# Patient Record
Sex: Male | Born: 1980 | Race: White | Hispanic: No | Marital: Single | Smoking: Never smoker
Health system: Southern US, Community
[De-identification: ages and names within clinical notes are randomized; demographics above are authoritative.]

## PROBLEM LIST (undated history)

## (undated) DIAGNOSIS — I10 Essential (primary) hypertension: Secondary | ICD-10-CM

## (undated) DIAGNOSIS — K5909 Other constipation: Secondary | ICD-10-CM

## (undated) DIAGNOSIS — J189 Pneumonia, unspecified organism: Secondary | ICD-10-CM

## (undated) DIAGNOSIS — R000 Tachycardia, unspecified: Secondary | ICD-10-CM

## (undated) DIAGNOSIS — I499 Cardiac arrhythmia, unspecified: Secondary | ICD-10-CM

## (undated) DIAGNOSIS — G809 Cerebral palsy, unspecified: Secondary | ICD-10-CM

## (undated) DIAGNOSIS — D689 Coagulation defect, unspecified: Secondary | ICD-10-CM

## (undated) DIAGNOSIS — G4700 Insomnia, unspecified: Secondary | ICD-10-CM

## (undated) DIAGNOSIS — N390 Urinary tract infection, site not specified: Secondary | ICD-10-CM

## (undated) DIAGNOSIS — I82409 Acute embolism and thrombosis of unspecified deep veins of unspecified lower extremity: Secondary | ICD-10-CM

## (undated) HISTORY — PX: HERNIA REPAIR: SHX51

## (undated) HISTORY — DX: Tachycardia, unspecified: R00.0

## (undated) HISTORY — DX: Cerebral palsy, unspecified: G80.9

## (undated) HISTORY — DX: Insomnia, unspecified: G47.00

## (undated) HISTORY — PX: SMALL INTESTINE SURGERY: SHX150

## (undated) HISTORY — DX: Coagulation defect, unspecified: D68.9

## (undated) HISTORY — DX: Other constipation: K59.09

## (undated) HISTORY — DX: Urinary tract infection, site not specified: N39.0

---
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
## 1999-01-10 ENCOUNTER — Inpatient Hospital Stay: Admission: EM | Admit: 1999-01-10 | Discharge: 1999-01-19 | Payer: Self-pay | Admitting: Infectious Diseases

## 1999-01-22 ENCOUNTER — Encounter: Payer: Self-pay | Admitting: Internal Medicine

## 1999-01-22 ENCOUNTER — Inpatient Hospital Stay (HOSPITAL_COMMUNITY): Admission: EM | Admit: 1999-01-22 | Discharge: 1999-01-24 | Payer: Self-pay | Admitting: Emergency Medicine

## 1999-03-02 ENCOUNTER — Emergency Department (HOSPITAL_COMMUNITY): Admission: EM | Admit: 1999-03-02 | Discharge: 1999-03-02 | Payer: Self-pay | Admitting: *Deleted

## 1999-03-02 ENCOUNTER — Encounter (HOSPITAL_BASED_OUTPATIENT_CLINIC_OR_DEPARTMENT_OTHER): Payer: Self-pay | Admitting: General Surgery

## 2001-03-02 ENCOUNTER — Encounter: Payer: Self-pay | Admitting: *Deleted

## 2001-03-02 ENCOUNTER — Encounter: Admission: RE | Admit: 2001-03-02 | Discharge: 2001-03-02 | Payer: Self-pay | Admitting: *Deleted

## 2001-03-02 ENCOUNTER — Ambulatory Visit (HOSPITAL_COMMUNITY): Admission: RE | Admit: 2001-03-02 | Discharge: 2001-03-02 | Payer: Self-pay | Admitting: *Deleted

## 2001-04-23 ENCOUNTER — Encounter: Payer: Self-pay | Admitting: *Deleted

## 2001-04-23 ENCOUNTER — Emergency Department (HOSPITAL_COMMUNITY): Admission: EM | Admit: 2001-04-23 | Discharge: 2001-04-24 | Payer: Self-pay | Admitting: *Deleted

## 2001-04-24 ENCOUNTER — Inpatient Hospital Stay (HOSPITAL_COMMUNITY): Admission: AD | Admit: 2001-04-24 | Discharge: 2001-04-26 | Payer: Self-pay | Admitting: Family Medicine

## 2001-04-24 ENCOUNTER — Encounter: Payer: Self-pay | Admitting: Family Medicine

## 2001-04-25 ENCOUNTER — Encounter: Payer: Self-pay | Admitting: Internal Medicine

## 2002-04-27 ENCOUNTER — Encounter: Payer: Self-pay | Admitting: Family Medicine

## 2002-04-27 ENCOUNTER — Encounter: Admission: RE | Admit: 2002-04-27 | Discharge: 2002-04-27 | Payer: Self-pay | Admitting: Family Medicine

## 2002-08-20 ENCOUNTER — Emergency Department (HOSPITAL_COMMUNITY): Admission: EM | Admit: 2002-08-20 | Discharge: 2002-08-20 | Payer: Self-pay | Admitting: Emergency Medicine

## 2002-08-20 ENCOUNTER — Encounter: Payer: Self-pay | Admitting: Emergency Medicine

## 2003-03-13 ENCOUNTER — Inpatient Hospital Stay (HOSPITAL_COMMUNITY): Admission: EM | Admit: 2003-03-13 | Discharge: 2003-03-14 | Payer: Self-pay | Admitting: Emergency Medicine

## 2003-04-17 ENCOUNTER — Emergency Department (HOSPITAL_COMMUNITY): Admission: EM | Admit: 2003-04-17 | Discharge: 2003-04-17 | Payer: Self-pay | Admitting: Emergency Medicine

## 2003-12-09 ENCOUNTER — Ambulatory Visit: Payer: Self-pay | Admitting: Family Medicine

## 2004-01-05 ENCOUNTER — Ambulatory Visit: Payer: Self-pay | Admitting: Family Medicine

## 2004-01-26 ENCOUNTER — Ambulatory Visit: Payer: Self-pay | Admitting: Family Medicine

## 2004-02-02 ENCOUNTER — Ambulatory Visit: Payer: Self-pay | Admitting: Family Medicine

## 2004-02-28 ENCOUNTER — Ambulatory Visit: Payer: Self-pay | Admitting: Family Medicine

## 2004-03-08 ENCOUNTER — Ambulatory Visit: Payer: Self-pay | Admitting: Family Medicine

## 2004-03-14 ENCOUNTER — Ambulatory Visit (HOSPITAL_COMMUNITY): Admission: RE | Admit: 2004-03-14 | Discharge: 2004-03-14 | Payer: Self-pay | Admitting: Dentistry

## 2004-03-21 ENCOUNTER — Ambulatory Visit: Payer: Self-pay | Admitting: Family Medicine

## 2004-04-12 ENCOUNTER — Ambulatory Visit: Payer: Self-pay | Admitting: Family Medicine

## 2004-05-10 ENCOUNTER — Ambulatory Visit: Payer: Self-pay | Admitting: Family Medicine

## 2004-05-16 ENCOUNTER — Ambulatory Visit: Payer: Self-pay | Admitting: Family Medicine

## 2004-06-07 ENCOUNTER — Emergency Department (HOSPITAL_COMMUNITY): Admission: EM | Admit: 2004-06-07 | Discharge: 2004-06-07 | Payer: Self-pay | Admitting: Emergency Medicine

## 2004-06-08 ENCOUNTER — Ambulatory Visit: Payer: Self-pay | Admitting: Family Medicine

## 2004-06-11 ENCOUNTER — Ambulatory Visit: Payer: Self-pay | Admitting: Family Medicine

## 2004-06-15 ENCOUNTER — Ambulatory Visit: Payer: Self-pay | Admitting: Family Medicine

## 2004-06-18 ENCOUNTER — Ambulatory Visit: Payer: Self-pay | Admitting: Family Medicine

## 2004-06-19 ENCOUNTER — Ambulatory Visit: Payer: Self-pay | Admitting: Family Medicine

## 2004-07-10 ENCOUNTER — Ambulatory Visit: Payer: Self-pay | Admitting: Family Medicine

## 2004-07-17 ENCOUNTER — Ambulatory Visit: Payer: Self-pay | Admitting: Family Medicine

## 2004-07-26 ENCOUNTER — Ambulatory Visit: Payer: Self-pay | Admitting: Family Medicine

## 2004-07-30 ENCOUNTER — Ambulatory Visit: Payer: Self-pay | Admitting: Family Medicine

## 2004-08-16 ENCOUNTER — Ambulatory Visit: Payer: Self-pay | Admitting: Family Medicine

## 2004-08-31 ENCOUNTER — Ambulatory Visit: Payer: Self-pay | Admitting: Family Medicine

## 2004-09-03 ENCOUNTER — Ambulatory Visit: Payer: Self-pay | Admitting: Family Medicine

## 2004-10-02 ENCOUNTER — Ambulatory Visit: Payer: Self-pay | Admitting: Family Medicine

## 2004-10-16 ENCOUNTER — Ambulatory Visit: Payer: Self-pay | Admitting: Family Medicine

## 2004-11-13 ENCOUNTER — Ambulatory Visit: Payer: Self-pay | Admitting: Family Medicine

## 2004-11-15 ENCOUNTER — Ambulatory Visit: Payer: Self-pay | Admitting: Family Medicine

## 2004-12-18 ENCOUNTER — Ambulatory Visit: Payer: Self-pay | Admitting: Family Medicine

## 2004-12-28 ENCOUNTER — Ambulatory Visit: Payer: Self-pay | Admitting: Family Medicine

## 2005-01-30 ENCOUNTER — Ambulatory Visit: Payer: Self-pay | Admitting: Family Medicine

## 2005-02-11 ENCOUNTER — Ambulatory Visit: Payer: Self-pay | Admitting: Family Medicine

## 2005-02-28 ENCOUNTER — Ambulatory Visit: Payer: Self-pay | Admitting: Family Medicine

## 2005-03-01 ENCOUNTER — Ambulatory Visit: Payer: Self-pay | Admitting: Family Medicine

## 2005-03-29 ENCOUNTER — Ambulatory Visit: Payer: Self-pay | Admitting: Family Medicine

## 2005-04-30 ENCOUNTER — Ambulatory Visit: Payer: Self-pay | Admitting: Family Medicine

## 2005-05-17 ENCOUNTER — Ambulatory Visit: Payer: Self-pay | Admitting: Family Medicine

## 2005-06-19 ENCOUNTER — Ambulatory Visit: Payer: Self-pay | Admitting: Family Medicine

## 2005-07-08 ENCOUNTER — Ambulatory Visit: Payer: Self-pay | Admitting: Family Medicine

## 2005-07-12 ENCOUNTER — Ambulatory Visit: Payer: Self-pay | Admitting: Family Medicine

## 2005-08-02 ENCOUNTER — Ambulatory Visit: Payer: Self-pay | Admitting: Family Medicine

## 2005-08-23 ENCOUNTER — Ambulatory Visit: Payer: Self-pay | Admitting: Family Medicine

## 2005-08-30 ENCOUNTER — Encounter: Admission: RE | Admit: 2005-08-30 | Discharge: 2005-11-28 | Payer: Self-pay | Admitting: Family Medicine

## 2005-09-12 ENCOUNTER — Ambulatory Visit: Payer: Self-pay | Admitting: Family Medicine

## 2005-09-13 ENCOUNTER — Ambulatory Visit: Payer: Self-pay | Admitting: Family Medicine

## 2005-09-14 ENCOUNTER — Ambulatory Visit: Payer: Self-pay | Admitting: Family Medicine

## 2005-09-16 ENCOUNTER — Ambulatory Visit: Payer: Self-pay | Admitting: Family Medicine

## 2005-09-20 ENCOUNTER — Ambulatory Visit: Payer: Self-pay | Admitting: Family Medicine

## 2005-10-03 ENCOUNTER — Ambulatory Visit: Payer: Self-pay | Admitting: Family Medicine

## 2005-10-25 ENCOUNTER — Ambulatory Visit: Payer: Self-pay | Admitting: Family Medicine

## 2005-11-01 ENCOUNTER — Ambulatory Visit: Payer: Self-pay | Admitting: Family Medicine

## 2005-11-22 ENCOUNTER — Ambulatory Visit: Payer: Self-pay | Admitting: Family Medicine

## 2005-12-20 ENCOUNTER — Ambulatory Visit: Payer: Self-pay | Admitting: Family Medicine

## 2006-01-20 ENCOUNTER — Ambulatory Visit: Payer: Self-pay | Admitting: Family Medicine

## 2006-01-29 ENCOUNTER — Ambulatory Visit: Payer: Self-pay | Admitting: Family Medicine

## 2006-02-28 ENCOUNTER — Ambulatory Visit: Payer: Self-pay | Admitting: Family Medicine

## 2006-04-04 ENCOUNTER — Ambulatory Visit: Payer: Self-pay | Admitting: Family Medicine

## 2006-05-02 ENCOUNTER — Ambulatory Visit: Payer: Self-pay | Admitting: Family Medicine

## 2006-05-16 ENCOUNTER — Ambulatory Visit: Payer: Self-pay | Admitting: Family Medicine

## 2006-05-20 ENCOUNTER — Emergency Department (HOSPITAL_COMMUNITY): Admission: EM | Admit: 2006-05-20 | Discharge: 2006-05-20 | Payer: Self-pay | Admitting: Emergency Medicine

## 2006-05-23 ENCOUNTER — Ambulatory Visit: Payer: Self-pay | Admitting: Internal Medicine

## 2006-05-29 ENCOUNTER — Ambulatory Visit: Payer: Self-pay | Admitting: Family Medicine

## 2006-06-24 ENCOUNTER — Ambulatory Visit: Payer: Self-pay | Admitting: Family Medicine

## 2006-07-14 ENCOUNTER — Ambulatory Visit: Payer: Self-pay | Admitting: Family Medicine

## 2006-07-25 ENCOUNTER — Ambulatory Visit: Payer: Self-pay | Admitting: Family Medicine

## 2006-07-25 DIAGNOSIS — D510 Vitamin B12 deficiency anemia due to intrinsic factor deficiency: Secondary | ICD-10-CM

## 2006-07-25 DIAGNOSIS — G809 Cerebral palsy, unspecified: Secondary | ICD-10-CM

## 2006-08-18 ENCOUNTER — Ambulatory Visit: Payer: Self-pay | Admitting: Family Medicine

## 2006-08-20 ENCOUNTER — Telehealth (INDEPENDENT_AMBULATORY_CARE_PROVIDER_SITE_OTHER): Payer: Self-pay | Admitting: *Deleted

## 2006-08-27 ENCOUNTER — Ambulatory Visit: Payer: Self-pay | Admitting: Family Medicine

## 2006-08-27 ENCOUNTER — Telehealth (INDEPENDENT_AMBULATORY_CARE_PROVIDER_SITE_OTHER): Payer: Self-pay | Admitting: *Deleted

## 2006-08-27 DIAGNOSIS — I4891 Unspecified atrial fibrillation: Secondary | ICD-10-CM | POA: Insufficient documentation

## 2006-08-27 LAB — CONVERTED CEMR LAB: Prothrombin Time: 14.7 s

## 2006-09-10 ENCOUNTER — Ambulatory Visit: Payer: Self-pay | Admitting: Family Medicine

## 2006-09-10 LAB — CONVERTED CEMR LAB
INR: 1.8
Prothrombin Time: 16.7 s

## 2006-09-24 ENCOUNTER — Ambulatory Visit: Payer: Self-pay | Admitting: Family Medicine

## 2006-09-24 LAB — CONVERTED CEMR LAB: Prothrombin Time: 15 s

## 2006-10-28 ENCOUNTER — Ambulatory Visit: Payer: Self-pay | Admitting: Family Medicine

## 2006-10-28 LAB — CONVERTED CEMR LAB: INR: 1.3

## 2006-10-30 DIAGNOSIS — J029 Acute pharyngitis, unspecified: Secondary | ICD-10-CM | POA: Insufficient documentation

## 2006-10-31 ENCOUNTER — Ambulatory Visit: Payer: Self-pay | Admitting: Family Medicine

## 2006-10-31 LAB — CONVERTED CEMR LAB: Rapid Strep: NEGATIVE

## 2006-11-06 ENCOUNTER — Telehealth: Payer: Self-pay | Admitting: Family Medicine

## 2006-11-12 ENCOUNTER — Encounter: Payer: Self-pay | Admitting: Family Medicine

## 2006-11-19 ENCOUNTER — Ambulatory Visit: Payer: Self-pay | Admitting: Family Medicine

## 2006-11-19 LAB — CONVERTED CEMR LAB: Prothrombin Time: 14.8 s

## 2006-11-25 ENCOUNTER — Telehealth: Payer: Self-pay | Admitting: Family Medicine

## 2006-11-27 ENCOUNTER — Telehealth: Payer: Self-pay | Admitting: Family Medicine

## 2006-12-17 ENCOUNTER — Ambulatory Visit: Payer: Self-pay | Admitting: Family Medicine

## 2006-12-17 LAB — CONVERTED CEMR LAB: INR: 2.6

## 2007-01-16 ENCOUNTER — Ambulatory Visit: Payer: Self-pay | Admitting: Family Medicine

## 2007-01-16 DIAGNOSIS — IMO0002 Reserved for concepts with insufficient information to code with codable children: Secondary | ICD-10-CM

## 2007-01-16 LAB — CONVERTED CEMR LAB: Prothrombin Time: 17.9 s

## 2007-01-27 ENCOUNTER — Telehealth: Payer: Self-pay | Admitting: Family Medicine

## 2007-02-02 ENCOUNTER — Telehealth: Payer: Self-pay | Admitting: Family Medicine

## 2007-02-10 ENCOUNTER — Encounter: Payer: Self-pay | Admitting: Family Medicine

## 2007-02-20 ENCOUNTER — Ambulatory Visit: Payer: Self-pay | Admitting: Family Medicine

## 2007-02-27 ENCOUNTER — Telehealth: Payer: Self-pay | Admitting: Family Medicine

## 2007-03-16 ENCOUNTER — Ambulatory Visit: Payer: Self-pay | Admitting: Family Medicine

## 2007-05-25 ENCOUNTER — Emergency Department (HOSPITAL_COMMUNITY): Admission: EM | Admit: 2007-05-25 | Discharge: 2007-05-26 | Payer: Self-pay | Admitting: Emergency Medicine

## 2008-05-24 ENCOUNTER — Encounter: Admission: RE | Admit: 2008-05-24 | Discharge: 2008-07-06 | Payer: Self-pay | Admitting: Internal Medicine

## 2008-06-15 ENCOUNTER — Emergency Department (HOSPITAL_COMMUNITY): Admission: EM | Admit: 2008-06-15 | Discharge: 2008-06-15 | Payer: Self-pay | Admitting: Emergency Medicine

## 2008-06-15 ENCOUNTER — Encounter (INDEPENDENT_AMBULATORY_CARE_PROVIDER_SITE_OTHER): Payer: Self-pay | Admitting: Emergency Medicine

## 2008-06-15 ENCOUNTER — Ambulatory Visit: Payer: Self-pay | Admitting: Vascular Surgery

## 2008-09-27 ENCOUNTER — Telehealth: Payer: Self-pay | Admitting: Family Medicine

## 2008-09-28 ENCOUNTER — Inpatient Hospital Stay (HOSPITAL_COMMUNITY): Admission: EM | Admit: 2008-09-28 | Discharge: 2008-10-03 | Payer: Self-pay | Admitting: Emergency Medicine

## 2008-11-14 ENCOUNTER — Encounter (HOSPITAL_BASED_OUTPATIENT_CLINIC_OR_DEPARTMENT_OTHER): Admission: RE | Admit: 2008-11-14 | Discharge: 2008-12-05 | Payer: Self-pay | Admitting: General Surgery

## 2008-12-27 ENCOUNTER — Observation Stay (HOSPITAL_COMMUNITY): Admission: EM | Admit: 2008-12-27 | Discharge: 2008-12-29 | Payer: Self-pay | Admitting: Emergency Medicine

## 2009-01-09 ENCOUNTER — Encounter (INDEPENDENT_AMBULATORY_CARE_PROVIDER_SITE_OTHER): Payer: Self-pay | Admitting: *Deleted

## 2009-01-09 ENCOUNTER — Encounter (HOSPITAL_BASED_OUTPATIENT_CLINIC_OR_DEPARTMENT_OTHER): Admission: RE | Admit: 2009-01-09 | Discharge: 2009-02-20 | Payer: Self-pay | Admitting: Internal Medicine

## 2009-04-29 ENCOUNTER — Encounter (INDEPENDENT_AMBULATORY_CARE_PROVIDER_SITE_OTHER): Payer: Self-pay | Admitting: Emergency Medicine

## 2009-04-29 ENCOUNTER — Ambulatory Visit: Payer: Self-pay | Admitting: Vascular Surgery

## 2009-04-29 ENCOUNTER — Emergency Department (HOSPITAL_COMMUNITY): Admission: EM | Admit: 2009-04-29 | Discharge: 2009-04-29 | Payer: Self-pay | Admitting: Emergency Medicine

## 2009-05-10 ENCOUNTER — Encounter: Payer: Self-pay | Admitting: Internal Medicine

## 2009-05-17 ENCOUNTER — Encounter: Payer: Self-pay | Admitting: Internal Medicine

## 2009-05-30 ENCOUNTER — Ambulatory Visit (HOSPITAL_COMMUNITY): Admission: RE | Admit: 2009-05-30 | Discharge: 2009-05-30 | Payer: Self-pay | Admitting: Dentistry

## 2009-07-06 ENCOUNTER — Encounter: Admission: RE | Admit: 2009-07-06 | Discharge: 2009-08-24 | Payer: Self-pay | Admitting: Orthopedic Surgery

## 2009-07-18 ENCOUNTER — Encounter: Payer: Self-pay | Admitting: Internal Medicine

## 2009-08-18 ENCOUNTER — Encounter: Payer: Self-pay | Admitting: Internal Medicine

## 2009-08-23 ENCOUNTER — Encounter: Payer: Self-pay | Admitting: Internal Medicine

## 2009-08-24 ENCOUNTER — Encounter: Payer: Self-pay | Admitting: Internal Medicine

## 2009-08-24 ENCOUNTER — Inpatient Hospital Stay (HOSPITAL_COMMUNITY): Admission: EM | Admit: 2009-08-24 | Discharge: 2009-08-29 | Payer: Self-pay | Admitting: Emergency Medicine

## 2009-09-06 ENCOUNTER — Encounter: Payer: Self-pay | Admitting: Internal Medicine

## 2009-09-08 ENCOUNTER — Encounter (INDEPENDENT_AMBULATORY_CARE_PROVIDER_SITE_OTHER): Payer: Self-pay | Admitting: *Deleted

## 2009-11-29 ENCOUNTER — Ambulatory Visit: Payer: Self-pay | Admitting: Internal Medicine

## 2009-11-29 DIAGNOSIS — K5900 Constipation, unspecified: Secondary | ICD-10-CM

## 2009-12-13 ENCOUNTER — Telehealth: Payer: Self-pay | Admitting: Internal Medicine

## 2010-02-27 NOTE — Letter (Signed)
Summary: [facility]   Imported By: Sherian Rein 12/01/2009 13:36:32  _____________________________________________________________________  External Attachment:    Type:   Image     Comment:   External Document

## 2010-02-27 NOTE — Progress Notes (Signed)
Summary: Medication  Phone Note Call from Patient   Caller: with Speech Assistance Call For: Dr. Leone Payor Reason for Call: Talk to Nurse Details for Reason: Medication Summary of Call: [facility] has pt. on Miralax as needed. He understood he was to be on it daily. If this is the case, could you please fax that order to [facility] at [phone] as well as call patient and advise him.  Thank you. Initial call taken by: Schuyler Amor,  December 13, 2009 2:03 PM  Follow-up for Phone Call        LM to [facility] at home number.  Last office note faxed to [facility]. Francee Piccolo CMA Duncan Dull)  December 13, 2009 5:04 PM   # given for Alvester Morin HOuse is an out service phone number.  Will await call back from pt. Francee Piccolo CMA Duncan Dull)  December 14, 2009 8:21 AM   2nd call to pt.  Advised pt I was awaiting a call from him.  As we were talking call was cut off.  RC to pt and recieved voicemail.  Message left to return call. Francee Piccolo CMA Duncan Dull)  December 20, 2009 11:15 AM   Pt returned call through [facility].  Pt states thta he is doing well on as needed Miralax.  Pt does not need me to re-fax office note to [facility].  He will call if he needs any further assistance. Follow-up by: Francee Piccolo CMA Duncan Dull),  December 26, 2009 10:09 AM

## 2010-02-27 NOTE — Letter (Signed)
Summary: [facility]   Imported By: Sherian Rein 12/01/2009 13:46:41  _____________________________________________________________________  External Attachment:    Type:   Image     Comment:   External Document

## 2010-02-27 NOTE — Assessment & Plan Note (Signed)
Summary: diarrhea--ch.   History of Present Illness Visit Type: consult  Primary GI MD: Stan Head MD [facility] Primary Krishiv Sandler: Georgianne Fick, MD  Requesting Yechezkel Fertig: Georgianne Fick, MD  Chief Complaint: diarrhea  History of Present Illness:   30 yo wm with CP. He has been admitted twice in past year with bowel obstructions. Last admission in July he has had alternating constipation and diarrhea. Mom provides history mostly. He was taking 2 Immodium a day with a nocturnal glycerin suppository with good bowel regimen. He was in hospital 2-3 times in last year, has been constipated and needed enemas, laxatives.  Through mom and the patient have determined that he will have no stools for a few days then empty out with normal and then loose stools which are somewhat unpredictable.   GI Review of Systems      Denies abdominal pain, acid reflux, belching, bloating, chest pain, dysphagia with liquids, dysphagia with solids, heartburn, loss of appetite, nausea, vomiting, vomiting blood, weight loss, and  weight gain.      Reports constipation and  diarrhea.     Denies anal fissure, black tarry stools, change in bowel habit, diverticulosis, fecal incontinence, heme positive stool, hemorrhoids, irritable bowel syndrome, jaundice, light color stool, liver problems, rectal bleeding, and  rectal pain.    CT Abdomen/Pelvis  Procedure date:  08/24/2009  Findings:        Gas and fluid filled stomach, small bowel, and colon with an   overall pattern most suggestive of ileus.  There could also be   adhesion at the site of ileocolonic anastomosis causing proximal   small bowel obstruction with coexistent colonic fluid present.  No   free air.   Current Medications (verified): 1)  Ambien 10 Mg  Tabs (Zolpidem Tartrate) .... Take 1 Tab By Mouth At Bedtime As Needed For Sleep 2)  Imodium A-D 1 Mg/7.67ml Liqd (Loperamide Hcl) .... As Needed 3)  Cyanocobalamin 1000 Mcg/ml Soln  (Cyanocobalamin) .... Monthly 4)  Metoprolol Succinate 25 Mg Xr24h-Tab (Metoprolol Succinate) .... One Tablet By Mouth Once Daily 5)  Nitrofurantoin Macrocrystal 50 Mg Caps (Nitrofurantoin Macrocrystal) .... One Capsule By Mouth Once Daily 6)  Tab-A-Vit Tablet .... One Tablet By Mouth Once Daily 7)  Diazepam 2 Mg Tabs (Diazepam) .... One Tablet By Mouth Two Times A Day 8)  Vitamin D3 1000 Unit Caps (Cholecalciferol) .... One Capsule By Mouth Two Times A Day 9)  Calcium 500 Mg Tabs (Calcium) .... One Tablet By Mouth Three Times A Day 10)  Cyclobenzaprine Hcl 10 Mg Tabs (Cyclobenzaprine Hcl) .... One Tablet By Mouth Three Times A Day 11)  Baclofen 20 Mg Tabs (Baclofen) .... One Tablet By Mouth Four Times Daily 12)  Mapap 500 Mg Caps (Acetaminophen) .... One-Two By Mouth At Bedtime 13)  Dulcolax 10 Mg Supp (Bisacodyl) .... As Needed 14)  Fleet Enema 7-19 Gm/164ml Enem (Sodium Phosphates) .... As Needed 15)  Loperamide Hcl 2 Mg Caps (Loperamide Hcl) .... As Needed 16)  Temazepam 15 Mg Caps (Temazepam) .... One Tablet By Mouth At Bedtime 17)  Warfarin Sodium 2 Mg Tabs (Warfarin Sodium) .... As Directed 18)  Warfarin Sodium 2.5 Mg Tabs (Warfarin Sodium) .... As Directed 19)  Warfarin Sodium 3 Mg Tabs (Warfarin Sodium) .... As Directed  Allergies (verified): 1)  ! Cipro 2)  ! Morphine 3)  ! 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  Alcohol Use - yes: occ  Daily Caffeine Use: 6 daily  Illicit Drug Use - no Drug Use:  no  Review of Systems       The patient complains of fatigue, fever, headaches-new, shortness of breath, swelling of feet/legs, thirst - excessive, and urination - excessive.         In whhelchair with CP All other ROS negative except as per HPI.   Vital Signs:  Patient profile:   30 year old male Pulse rhythm:   regular Cuff size:   regular  Vitals Entered By: Ok Anis CMA (November 29, 2009 3:43 PM)  Physical Exam  General:  CP patient, facies, contractures consistent Eyes:  PERRLA, no icterus. Mouth:  poor dentition Neck:  no mass Lungs:  Clear throughout to auscultation. Heart:  Regular rate and rhythm; no murmurs, rubs,  or bruits. Abdomen:  muscular soft nontender Extremities:  braces, no edema Neurologic:  able to communicate with using letters and some simple words Cervical Nodes:  No significant cervical or supraclavicular adenopathy.   Full celiac antibody panel, IgA level, B12, CBC, CMET all ok in July 2011  Impression & Recommendations:  Problem # 1:  CONSTIPATION (ICD-564.00) Assessment New Constipation and then overflow-type issues seems to be the problem and not diarrhea. Could be wrong with history of bowel resectrion and short bowel syndrome. Could not do a rectal exam here diue to CP and inability to move him out of a wheelchair.  He has had what appear to have been fecal impactions with at least 2 of last 3 admits. Will try regular MiraLax and as needed glycerin suppositories. Minimize caffeine and minimize use of loperamde. If he ends up having more of a true diarrhea problem then would probably try antibiotics for possible SIBO but need to be cautious with warfarin therapy. Old notes in office, hhospital, labs and xrays reviewed and viewed.  Patient Instructions: 1)  Printed prescriptions given for Miralax,  Glycerin Suppositories to take to [facility]. 2)  Please decrease your caffeine. 3)  Avoid using Loperamide (imodium). 4)  Call back with any questions or if you are not getting better. 5)  Copy sent to : Georgianne Fick, MD 6)  The medication list was reviewed and reconciled.  All changed / newly prescribed medications were explained.  A complete medication list was provided to the patient / caregiver. Prescriptions: MIRALAX  POWD (POLYETHYLENE GLYCOL 3350) 1 cap or 1 tablespoon in 8 oz water daily, hold for diarrhea, call Dr. Leone Payor if dose change needed  #30 doses x 11   Entered and Authorized by:   Iva Boop MD, [facility]   Signed by:   Iva Boop MD, [facility] on 11/29/2009   Method used:   Print then Give to Patient   RxID:   [id number] FLEET LIQUID GLYCERIN SUPP 5.6 GM/DOSE ENEM (GLYCERIN (LAXATIVE)) 1 per rectum as needed  #30 x 11   Entered and Authorized by:   Iva Boop MD, [facility]   Signed by:   Iva Boop MD, FACG on 11/29/2009  Method used:   Print then Give to Patient   RxID:   [id number]

## 2010-02-27 NOTE — Letter (Signed)
Summary: New Patient letter  [facility]  [address] [place], [place] [address]   Phone: [phone]  Fax: [phone]       09/08/2009 MRN: [id number]  [name] [address] [place], [place]  [address]  Dear [name],  Welcome to the Gastroenterology Division at [facility].    You are scheduled to see Dr.  Leone Payor on 10-26-09 at 10:30a.m. on the 3rd floor at [facility], [address].  We ask that you try to arrive at our office 15 minutes prior to your appointment time to allow for check-in.  We would like you to complete the enclosed self-administered evaluation form prior to your visit and bring it with you on the day of your appointment.  We will review it with you.  Also, please bring a complete list of all your medications or, if you prefer, bring the medication bottles and we will list them.  Please bring your insurance card so that we may make a copy of it.  If your insurance requires a referral to see a specialist, please bring your referral form from your primary care physician.  Co-payments are due at the time of your visit and may be paid by cash, check or credit card.     Your office visit will consist of a consult with your physician (includes a physical exam), any laboratory testing he/she may order, scheduling of any necessary diagnostic testing (e.g. x-ray, ultrasound, CT-scan), and scheduling of a procedure (e.g. Endoscopy, Colonoscopy) if required.  Please allow enough time on your schedule to allow for any/all of these possibilities.    If you cannot keep your appointment, please call [phone] to cancel or reschedule prior to your appointment date.  This allows [place] the opportunity to schedule an appointment for another patient in need of care.  If you do not cancel or reschedule by 5 p.m. the business day prior to your appointment date, you will be charged a $50.00 late cancellation/no-show fee.    Thank you for choosing  [facility] for your medical needs.  We appreciate the opportunity to care for you.  Please visit [place] at our website  to learn more about our practice.                     Sincerely,                                                             The Gastroenterology Division

## 2010-02-27 NOTE — Letter (Signed)
Summary: [facility]   Imported By: Sherian Rein 12/01/2009 13:45:34  _____________________________________________________________________  External Attachment:    Type:   Image     Comment:   External Document

## 2010-02-27 NOTE — Letter (Signed)
Summary: [facility]   Imported By: Sherian Rein 12/01/2009 13:42:29  _____________________________________________________________________  External Attachment:    Type:   Image     Comment:   External Document

## 2010-02-27 NOTE — Letter (Signed)
Summary: [facility]   Imported By: Sherian Rein 12/01/2009 13:44:00  _____________________________________________________________________  External Attachment:    Type:   Image     Comment:   External Document

## 2010-03-01 ENCOUNTER — Ambulatory Visit (HOSPITAL_COMMUNITY): Admission: RE | Admit: 2010-03-01 | Payer: Self-pay | Source: Ambulatory Visit

## 2010-03-01 ENCOUNTER — Ambulatory Visit (HOSPITAL_COMMUNITY)
Admission: RE | Admit: 2010-03-01 | Discharge: 2010-03-01 | Disposition: A | Discharge status: Home / Self Care | Payer: 59 | Source: Ambulatory Visit | Attending: Family Medicine | Admitting: Family Medicine

## 2010-03-01 DIAGNOSIS — M7989 Other specified soft tissue disorders: Secondary | ICD-10-CM | POA: Insufficient documentation

## 2010-03-01 DIAGNOSIS — M79609 Pain in unspecified limb: Secondary | ICD-10-CM | POA: Insufficient documentation

## 2010-03-09 ENCOUNTER — Emergency Department (HOSPITAL_COMMUNITY): Payer: 59

## 2010-03-09 ENCOUNTER — Inpatient Hospital Stay (HOSPITAL_COMMUNITY)
Admission: EM | Admit: 2010-03-09 | Discharge: 2010-03-12 | DRG: 301 | Disposition: A | Discharge status: Nursing Home (Includes ICF) | Payer: 59 | Attending: Internal Medicine | Admitting: Internal Medicine

## 2010-03-09 DIAGNOSIS — Z79899 Other long term (current) drug therapy: Secondary | ICD-10-CM

## 2010-03-09 DIAGNOSIS — Y939 Activity, unspecified: Secondary | ICD-10-CM

## 2010-03-09 DIAGNOSIS — IMO0002 Reserved for concepts with insufficient information to code with codable children: Secondary | ICD-10-CM | POA: Diagnosis present

## 2010-03-09 DIAGNOSIS — M7989 Other specified soft tissue disorders: Secondary | ICD-10-CM

## 2010-03-09 DIAGNOSIS — G809 Cerebral palsy, unspecified: Secondary | ICD-10-CM | POA: Diagnosis present

## 2010-03-09 DIAGNOSIS — I10 Essential (primary) hypertension: Secondary | ICD-10-CM | POA: Diagnosis present

## 2010-03-09 DIAGNOSIS — Z9049 Acquired absence of other specified parts of digestive tract: Secondary | ICD-10-CM

## 2010-03-09 DIAGNOSIS — Y929 Unspecified place or not applicable: Secondary | ICD-10-CM

## 2010-03-09 DIAGNOSIS — Z7901 Long term (current) use of anticoagulants: Secondary | ICD-10-CM

## 2010-03-09 DIAGNOSIS — I82509 Chronic embolism and thrombosis of unspecified deep veins of unspecified lower extremity: Secondary | ICD-10-CM | POA: Diagnosis present

## 2010-03-09 DIAGNOSIS — N319 Neuromuscular dysfunction of bladder, unspecified: Secondary | ICD-10-CM | POA: Diagnosis present

## 2010-03-09 DIAGNOSIS — X58XXXA Exposure to other specified factors, initial encounter: Secondary | ICD-10-CM | POA: Diagnosis present

## 2010-03-09 DIAGNOSIS — I824Y9 Acute embolism and thrombosis of unspecified deep veins of unspecified proximal lower extremity: Principal | ICD-10-CM | POA: Diagnosis present

## 2010-03-09 DIAGNOSIS — Y998 Other external cause status: Secondary | ICD-10-CM

## 2010-03-09 LAB — POCT I-STAT, CHEM 8
BUN: 7 mg/dL (ref 6–23)
Calcium, Ion: 1.14 mmol/L (ref 1.12–1.32)
Chloride: 104 mEq/L (ref 96–112)
Creatinine, Ser: 1.1 mg/dL (ref 0.4–1.5)
Glucose, Bld: 89 mg/dL (ref 70–99)
Potassium: 4.4 mEq/L (ref 3.5–5.1)

## 2010-03-09 MED ORDER — IOHEXOL 300 MG/ML  SOLN
100.0000 mL | Freq: Once | INTRAMUSCULAR | Status: AC | PRN
  Administered 2010-03-09: 100 mL via INTRAVENOUS

## 2010-03-10 LAB — BASIC METABOLIC PANEL
CO2: 29 mEq/L (ref 19–32)
Calcium: 9 mg/dL (ref 8.4–10.5)
GFR calc Af Amer: >60 mL/min (ref >60)
GFR calc non Af Amer: >60 mL/min (ref >60)
Glucose, Bld: 93 mg/dL (ref 70–99)
Potassium: 4.2 mEq/L (ref 3.5–5.1)
Sodium: 143 mEq/L (ref 135–145)

## 2010-03-10 LAB — CBC
Hemoglobin: 14.1 g/dL (ref 13.0–17.0)
MCH: 30.3 pg (ref 26.0–34.0)
MCV: 89.3 fL (ref 78.0–100.0)
Platelets: 173 10*3/uL (ref 150–400)
RBC: 4.66 MIL/uL (ref 4.22–5.81)
WBC: 6.4 10*3/uL (ref 4.0–10.5)

## 2010-03-10 LAB — PROTIME-INR: Prothrombin Time: 22.2 seconds — ABNORMAL HIGH (ref 11.6–15.2)

## 2010-03-11 LAB — BASIC METABOLIC PANEL
Calcium: 8.5 mg/dL (ref 8.4–10.5)
GFR calc Af Amer: >60 mL/min (ref >60)
GFR calc non Af Amer: >60 mL/min (ref >60)
Potassium: 4 mEq/L (ref 3.5–5.1)
Sodium: 142 mEq/L (ref 135–145)

## 2010-03-11 LAB — CBC
Hemoglobin: 14.1 g/dL (ref 13.0–17.0)
MCH: 30.1 pg (ref 26.0–34.0)
Platelets: 164 10*3/uL (ref 150–400)
RBC: 4.69 MIL/uL (ref 4.22–5.81)
WBC: 5.9 10*3/uL (ref 4.0–10.5)

## 2010-03-11 LAB — PROTIME-INR: Prothrombin Time: 23.7 seconds — ABNORMAL HIGH (ref 11.6–15.2)

## 2010-03-12 LAB — PROTIME-INR
INR: 2.24 — ABNORMAL HIGH (ref 0.00–1.49)
Prothrombin Time: 24.9 seconds — ABNORMAL HIGH (ref 11.6–15.2)

## 2010-03-13 ENCOUNTER — Emergency Department (HOSPITAL_COMMUNITY)
Admission: EM | Admit: 2010-03-13 | Discharge: 2010-03-14 | Disposition: A | Discharge status: Home / Self Care | Payer: Managed Care, Other (non HMO) | Attending: Emergency Medicine | Admitting: Emergency Medicine

## 2010-03-13 ENCOUNTER — Emergency Department (HOSPITAL_COMMUNITY): Payer: Managed Care, Other (non HMO)

## 2010-03-13 ENCOUNTER — Encounter (HOSPITAL_COMMUNITY): Payer: Self-pay | Admitting: Radiology

## 2010-03-13 DIAGNOSIS — R0602 Shortness of breath: Secondary | ICD-10-CM | POA: Insufficient documentation

## 2010-03-13 DIAGNOSIS — R079 Chest pain, unspecified: Secondary | ICD-10-CM | POA: Insufficient documentation

## 2010-03-13 DIAGNOSIS — M549 Dorsalgia, unspecified: Secondary | ICD-10-CM | POA: Insufficient documentation

## 2010-03-13 DIAGNOSIS — Z7901 Long term (current) use of anticoagulants: Secondary | ICD-10-CM | POA: Insufficient documentation

## 2010-03-13 DIAGNOSIS — Z86718 Personal history of other venous thrombosis and embolism: Secondary | ICD-10-CM | POA: Insufficient documentation

## 2010-03-13 DIAGNOSIS — G809 Cerebral palsy, unspecified: Secondary | ICD-10-CM | POA: Insufficient documentation

## 2010-03-13 HISTORY — DX: Essential (primary) hypertension: I10

## 2010-03-13 LAB — BASIC METABOLIC PANEL
Calcium: 9.9 mg/dL (ref 8.4–10.5)
GFR calc Af Amer: >60 mL/min (ref >60)
GFR calc non Af Amer: >60 mL/min (ref >60)
Glucose, Bld: 104 mg/dL — ABNORMAL HIGH (ref 70–99)
Potassium: 4.6 mEq/L (ref 3.5–5.1)
Sodium: 141 mEq/L (ref 135–145)

## 2010-03-13 LAB — PROTIME-INR
INR: 1.95 — ABNORMAL HIGH (ref 0.00–1.49)
Prothrombin Time: 22.4 seconds — ABNORMAL HIGH (ref 11.6–15.2)

## 2010-03-13 LAB — DIFFERENTIAL
Basophils Absolute: 0 10*3/uL (ref 0.0–0.1)
Basophils Relative: 0 % (ref 0–1)
Eosinophils Absolute: 0.1 10*3/uL (ref 0.0–0.7)
Neutro Abs: 4.9 10*3/uL (ref 1.7–7.7)
Neutrophils Relative %: 63 % (ref 43–77)

## 2010-03-13 LAB — URINALYSIS, ROUTINE W REFLEX MICROSCOPIC
Bilirubin Urine: NEGATIVE
Nitrite: NEGATIVE
Specific Gravity, Urine: 1.012 (ref 1.005–1.030)
Urobilinogen, UA: 0.2 mg/dL (ref 0.0–1.0)

## 2010-03-13 LAB — CBC
Hemoglobin: 17.2 g/dL — ABNORMAL HIGH (ref 13.0–17.0)
MCH: 30.8 pg (ref 26.0–34.0)
Platelets: 174 10*3/uL (ref 150–400)
RBC: 5.58 MIL/uL (ref 4.22–5.81)
WBC: 7.7 10*3/uL (ref 4.0–10.5)

## 2010-03-13 MED ORDER — TECHNETIUM TO 99M ALBUMIN AGGREGATED
5.0000 | Freq: Once | INTRAVENOUS | Status: AC | PRN
  Administered 2010-03-13: 4.87 via INTRAVENOUS

## 2010-03-13 MED ORDER — XENON XE 133 GAS
5.0000 | GAS_FOR_INHALATION | Freq: Once | RESPIRATORY_TRACT | Status: AC | PRN
  Administered 2010-03-13: 9.45 via RESPIRATORY_TRACT

## 2010-03-14 NOTE — H&P (Signed)
[name], [name]           ACCOUNT NO.:  [id number]  MEDICAL RECORD NO.:  [id number]           PATIENT TYPE:  E  LOCATION:  WLED                         FACILITY:  [facility]  PHYSICIAN:  Thad Ranger, MD       DATE OF BIRTH:  1980-11-22  DATE OF ADMISSION:  03/09/2010 DATE OF DISCHARGE:                             HISTORY & PHYSICAL   PRIMARY CARE PHYSICIAN:  Georgianne Fick, MD  CHIEF COMPLAINT:  Left lower extremity swelling.  HISTORY OF PRESENT ILLNESS:  [name] is a 30 year old male with a past medical history of cerebral palsy, history of prior small-bowel obstruction with bowel resection, history of prior DVTs, on chronic Coumadin therapy, who was brought from the [facility] facility for the left lower extremity swelling.  History was obtained from the patient as well as the caregiver present at the bedside.  The patient has a 2-week history of left extremity swelling but no significant tenderness or warmth or skin color changes.  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  The patient was seen by his primary care physician at the beginning of February; at that time INR was therapeutic.  The patient had a Doppler ultrasound done in the emergency room which did show acute DVT in the proximal common femoral vein.  No superficial thrombophlebitis or Baker's cyst.  The patient states that this morning he had some shortness of breath but no chest pain.  He did mention slight palpitations in the morning.  The patient also stated that he had a trip to [place] in a bus in December to visit a friend.  He states that it was a long 25-hour trip, and he was taken to the emergency room over there for high BP readings.  The patient has an indwelling Foley catheter secondary to neurogenic bladder.  The patient's caregiver is Ladene Artist, phone number [phone].  Also he had a left hand superficial abrasion this morning but denies  any complaint in the left hand.  PAST MEDICAL HISTORY: 1. History of cerebral palsy. 2. History of prior DVT, on anticoagulation. 3. History of previous bowel obstruction with ileus and bowel     resection. 4. History of constipation. 5. History of hernia repair.  PAST SURGICAL HISTORY:  Bowel resection.  SOCIAL HISTORY:  Denies any smoking, alcohol or any drug use.  He currently lives in an assisted living facility.  MEDICATIONS ON ADMISSION: 1. Vitamin B12 injection monthly. 2. Folic acid 1 tablet p.o. every day. 3. Toprol XL 25 mg p.o. daily. 4. Nitrofurantoin 50 mg p.o. daily. 5. Multivitamin 1 tablet daily. 6. Diazepam/Valium 2 mg p.o. b.i.d. 7. Vitamin D3 1000 units b.i.d. 8. Calcium carbonate 500 mg p.o. t.i.d. 9. Cyclobenzaprine 10 mg p.o. t.i.d. 10.Baclofen 20 mg p.o. q.i.d. 11.Fleet's enema rectally as needed. 12.MiraLAX 17 g p.o. daily as needed. 13.Coumadin 3 mg daily except Mondays it is 2.5 mg.  PHYSICAL EXAM:  VITAL SIGNS:  Blood pressure 143/92, pulse rate 97, respiratory rate 18, temperature 97.0.  GENERAL:  The patient is alert and awake and at the baseline.  HEENT:  Anicteric sclerae,  conjunctivae. Pupils react to light and accommodation.  EOMI.  NECK: Supple.  No lymphopathy.  No JVD.  CARDIOVASCULAR:  Tachycardic, regular rate and rhythm.  No murmurs, rubs or gallops.  CHEST:  Decreased breath sounds at the bases, otherwise fairly clear.  ABDOMEN:  Soft, nontender, nondistended.  Normal bowel sounds.  EXTREMITIES:  Contractures in the lower extremities noted.  SKIN:  Warm and dry.  Left hand abrasion. NEUROLOGIC:  Alert and awake with some dysarthria and contractures.  PERTINENT LABORATORY AND X-RAY DATA:  I-STAT showed sodium 142, potassium 4.4, BUN 7, creatinine 1.1.  Hemoglobin 16.3, hematocrit 48.0. PT 21.8, INR 1.8.  Radiological data:  Left Doppler ultrasound showed DVT in the proximal common femoral vein with no obvious propagation to the  right.  No evidence of superficial thrombosis or Baker's cyst.  IMPRESSION AND PLAN:  [name] is a 31 year old male with a history of cerebral palsy, prior history of deep venous thrombosis, hypertension, who presented with left lower extremity pain likely secondary to recurrent deep venous thrombosis. 1. 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  We will place     him on Lovenox and Coumadin per Pharmacy.  I will obtain CT     angiogram of the chest to rule out PE given his complaint of mild     shortness of breath with some palpitations this morning.  We will     also consult Interventional Radiology for a possible IVC filter     placement given his recurrent deep venous thromboses. 2. Neurogenic bladder:  Continue indwelling Foley catheter. 3. History of cerebral palsy:  Continue baclofen and Flexeril. 4. Hypertension:  Will continue Toprol XL, prophylaxis with Lovenox,     and the patient is on Coumadin.     Thad Ranger, MD     RR/MEDQ  D:  03/09/2010  T:  03/09/2010  Job:  [id number]  cc:   Georgianne Fick, M.D. Fax: [phone]  Electronically Signed by Suraiya Dickerson  on 03/14/2010 03:11:17 PM

## 2010-03-16 LAB — URINE CULTURE

## 2010-03-20 NOTE — Discharge Summary (Signed)
[name]           ACCOUNT NO.:  [id number]  MEDICAL RECORD NO.:  [id number]           PATIENT TYPE:  I  LOCATION:  1438                         FACILITY:  [facility]  PHYSICIAN:  Kathlen Mody, MD       DATE OF BIRTH:  May 31, 1980  DATE OF ADMISSION:  03/09/2010 DATE OF DISCHARGE:  03/12/2010                              DISCHARGE SUMMARY   PRIMARY CARE PHYSICIAN:  Georgianne Fick, M.D.  DISCHARGE DIAGNOSES: 1. 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 2. Neurogenic bladder. 3. Cerebral palsy. 4. Hypertension. 5. Bowel obstruction. 6. Constipation.  DISCHARGE MEDICATIONS: 1. Hyzaar 100/25 mg p.o. one tablet daily. 2. Cortisporin Otic eardrops four drops for about 10 days. 3. Coumadin 5 mg daily. 4. Loperamide 2 mg p.o. one capsule t.i.d. p.r.n. 5. Diazepam 2 mg p.o. one tablet twice a day. 6. Metoprolol 25 mg one tablet p.o. daily. 7. Fleet enema daily as needed. 8. Glycerin rectal suppository p.r.n. 9. MiraLax 17 grams daily p.r.n. 10.Cyclobenzaprine 10 mg one tablet t.i.d. 11.Artificial tears drops two drops twice a day both eyes p.r.n. 12.Baclofen 20 mg p.o. one tablet four times a day. 13.Multivitamin one tablet daily. 14.Calcium carbonate 500 mg p.o. one tablet daily. 15.Nitrofurantoin 50 mg one capsule daily. 16.Folic acid 1 mg tablet daily. 17.Vitamin B12 1000 mcg one injection monthly. 18.Vitamin D tablet 1000 mg p.o. one tablet twice a day.  CONSULTATIONS:  Interventional Radiology consult for IVC filter placement.  Recommended that the patient does not meet the requirements or indication for IVC at this time.  PERTINENT LABORATORY DATA:  The patient had a CBC done, which was pretty much normal.  INR on admission was 1.88.  On admission Chem-8 was normal.  The INR today, March 12, 2010, was 2.24.  RADIOLOGY:  The patient had a CT angiogram which was negative for pulmonary embolism but showed some scattered areas of atelectasis.  BRIEF  HOSPITAL COURSE: 1. This is a 30 year old gentleman with past medical history of     cerebral palsy, prior DVTs on chronic Coumadin therapy who came in     from [facility] assisted living facility for left lower extremity     swelling.  He was found to have acute-on-chronic DVTs.  The     patient's INR on admission was subtherapeutic.  The patient was put     on Lovenox and Coumadin.  His INR has been therapeutic for the last     2 days.  He will be discharged on Coumadin of 5 mg daily with INR     checkup every week.  The patient was also complaining of some     shortness of breath on admission and a CT angiogram was done to     rule out PE, which came back negative.  An Interventional Radiology     consult was called for possible IVC filter placement, but     Interventional Radiology deemed the patient does not meet     indication for IVC filter at this time so an IVC filter was not     placed. 2. Neurogenic bladder.  The patient has an indwelling Foley catheter  and nitrofurantoin.  We will continue the same. 3. Cerebral palsy.  Continue with baclofen and Flexeril. 4. Hypertension, controlled.  We will continue with Toprol and Hyzaar.  PHYSICAL EXAMINATION:  VITAL SIGNS:  On the day of admission the patient's vital signs includes a temperature of 97.5, pulse of 87, respirations 16 per minute, saturating 96% on room air with a blood pressure 129/80. GENERAL:  He is alert, afebrile and comfortable, in no acute distress. CARDIOVASCULAR EXAMINATION:  S1-S2 heard. RESPIRATORY EXAMINATION:  Good air entry bilaterally. ABDOMEN:  Soft, nontender, nondistended.  Good bowel sounds. EXTREMITIES:  Left lower extremity slightly swollen.  DISCHARGE INSTRUCTIONS:  The patient at this time is hemodynamically stable for discharge with Coumadin, with therapeutic INR, to get weekly INR checks and to follow with PCP in about 1 to 2 weeks at the [facility] assisted living facility.           ______________________________ Kathlen Mody, MD     VA/MEDQ  D:  03/12/2010  T:  03/12/2010  Job:  [id number]  cc:   Georgianne Fick, M.D. Fax: [phone]  Electronically Signed by Kathlen Mody MD on 03/20/2010 10:30:07 PM

## 2010-04-10 ENCOUNTER — Ambulatory Visit: Payer: Managed Care, Other (non HMO) | Attending: Family Medicine | Admitting: Physical Therapy

## 2010-04-10 DIAGNOSIS — R293 Abnormal posture: Secondary | ICD-10-CM | POA: Insufficient documentation

## 2010-04-10 DIAGNOSIS — IMO0001 Reserved for inherently not codable concepts without codable children: Secondary | ICD-10-CM | POA: Insufficient documentation

## 2010-04-10 DIAGNOSIS — M2560 Stiffness of unspecified joint, not elsewhere classified: Secondary | ICD-10-CM | POA: Insufficient documentation

## 2010-04-10 DIAGNOSIS — R279 Unspecified lack of coordination: Secondary | ICD-10-CM | POA: Insufficient documentation

## 2010-04-13 LAB — PROTIME-INR
INR: 1.94 — ABNORMAL HIGH (ref 0.00–1.49)
Prothrombin Time: 22.3 seconds — ABNORMAL HIGH (ref 11.6–15.2)
Prothrombin Time: 24.9 seconds — ABNORMAL HIGH (ref 11.6–15.2)

## 2010-04-13 LAB — BASIC METABOLIC PANEL
Calcium: 8.8 mg/dL (ref 8.4–10.5)
Chloride: 109 mEq/L (ref 96–112)
GFR calc Af Amer: >60 mL/min (ref >60)
GFR calc non Af Amer: >60 mL/min (ref >60)
GFR calc non Af Amer: >60 mL/min (ref >60)
Potassium: 3.9 mEq/L (ref 3.5–5.1)
Sodium: 140 mEq/L (ref 135–145)
Sodium: 140 mEq/L (ref 135–145)

## 2010-04-14 LAB — CBC
HCT: 40.4 % (ref 39.0–52.0)
Hemoglobin: 13.7 g/dL (ref 13.0–17.0)
Hemoglobin: 17.6 g/dL — ABNORMAL HIGH (ref 13.0–17.0)
MCHC: 33.3 g/dL (ref 30.0–36.0)
MCHC: 34.3 g/dL (ref 30.0–36.0)
MCV: 91.7 fL (ref 78.0–100.0)
Platelets: 173 10*3/uL (ref 150–400)
Platelets: 188 10*3/uL (ref 150–400)
RBC: 4.41 MIL/uL (ref 4.22–5.81)
RDW: 12.9 % (ref 11.5–15.5)
RDW: 13.2 % (ref 11.5–15.5)
WBC: 6.6 10*3/uL (ref 4.0–10.5)

## 2010-04-14 LAB — BASIC METABOLIC PANEL
BUN: 3 mg/dL — ABNORMAL LOW (ref 6–23)
BUN: 4 mg/dL — ABNORMAL LOW (ref 6–23)
CO2: 23 mEq/L (ref 19–32)
Calcium: 8.3 mg/dL — ABNORMAL LOW (ref 8.4–10.5)
Calcium: 8.6 mg/dL (ref 8.4–10.5)
Chloride: 106 mEq/L (ref 96–112)
Creatinine, Ser: 0.67 mg/dL (ref 0.4–1.5)
Creatinine, Ser: 0.74 mg/dL (ref 0.4–1.5)
Creatinine, Ser: 0.83 mg/dL (ref 0.4–1.5)
GFR calc Af Amer: >60 mL/min (ref >60)
GFR calc non Af Amer: >60 mL/min (ref >60)
GFR calc non Af Amer: >60 mL/min (ref >60)
Glucose, Bld: 100 mg/dL — ABNORMAL HIGH (ref 70–99)
Glucose, Bld: 97 mg/dL (ref 70–99)
Potassium: 3.9 mEq/L (ref 3.5–5.1)
Sodium: 140 mEq/L (ref 135–145)

## 2010-04-14 LAB — COMPREHENSIVE METABOLIC PANEL
ALT: 42 U/L (ref 0–53)
AST: 35 U/L (ref 0–37)
Calcium: 10.1 mg/dL (ref 8.4–10.5)
GFR calc Af Amer: >60 mL/min (ref >60)
Sodium: 138 mEq/L (ref 135–145)
Total Protein: 7.9 g/dL (ref 6.0–8.3)

## 2010-04-14 LAB — DIFFERENTIAL
Eosinophils Absolute: 0 10*3/uL (ref 0.0–0.7)
Eosinophils Relative: 0 % (ref 0–5)
Lymphs Abs: 0.7 10*3/uL (ref 0.7–4.0)
Monocytes Relative: 8 % (ref 3–12)

## 2010-04-14 LAB — MAGNESIUM: Magnesium: 2 mg/dL (ref 1.5–2.5)

## 2010-04-14 LAB — MRSA PCR SCREENING: MRSA by PCR: NEGATIVE

## 2010-04-14 LAB — PROTIME-INR
INR: 2.37 — ABNORMAL HIGH (ref 0.00–1.49)
INR: 2.61 — ABNORMAL HIGH (ref 0.00–1.49)
Prothrombin Time: 25.7 seconds — ABNORMAL HIGH (ref 11.6–15.2)
Prothrombin Time: 28.5 seconds — ABNORMAL HIGH (ref 11.6–15.2)

## 2010-04-14 LAB — TSH: TSH: 0.637 u[IU]/mL (ref 0.350–4.500)

## 2010-04-17 LAB — CBC
HCT: 44 % (ref 39.0–52.0)
MCHC: 34.9 g/dL (ref 30.0–36.0)
MCV: 90.5 fL (ref 78.0–100.0)
Platelets: 197 10*3/uL (ref 150–400)

## 2010-04-17 LAB — BASIC METABOLIC PANEL
BUN: 9 mg/dL (ref 6–23)
CO2: 27 mEq/L (ref 19–32)
Chloride: 108 mEq/L (ref 96–112)
Creatinine, Ser: 0.88 mg/dL (ref 0.4–1.5)
Potassium: 4.7 mEq/L (ref 3.5–5.1)

## 2010-04-18 LAB — CBC
HCT: 40.2 % (ref 39.0–52.0)
MCV: 90.4 fL (ref 78.0–100.0)
Platelets: 200 10*3/uL (ref 150–400)
RDW: 13.3 % (ref 11.5–15.5)
WBC: 6.9 10*3/uL (ref 4.0–10.5)

## 2010-04-18 LAB — DIFFERENTIAL
Basophils Absolute: 0 10*3/uL (ref 0.0–0.1)
Eosinophils Absolute: 0.2 10*3/uL (ref 0.0–0.7)
Eosinophils Relative: 2 % (ref 0–5)
Lymphocytes Relative: 24 % (ref 12–46)
Lymphs Abs: 1.7 10*3/uL (ref 0.7–4.0)
Neutrophils Relative %: 66 % (ref 43–77)

## 2010-04-18 LAB — PROTIME-INR
INR: 1.75 — ABNORMAL HIGH (ref 0.00–1.49)
Prothrombin Time: 20.3 seconds — ABNORMAL HIGH (ref 11.6–15.2)

## 2010-04-18 LAB — POCT I-STAT, CHEM 8
Calcium, Ion: 1.13 mmol/L (ref 1.12–1.32)
Chloride: 106 mEq/L (ref 96–112)
Glucose, Bld: 98 mg/dL (ref 70–99)
HCT: 40 % (ref 39.0–52.0)
Hemoglobin: 13.6 g/dL (ref 13.0–17.0)

## 2010-04-27 ENCOUNTER — Ambulatory Visit: Payer: Medicare Other | Admitting: Physical Therapy

## 2010-04-27 ENCOUNTER — Ambulatory Visit: Payer: Managed Care, Other (non HMO) | Admitting: Physical Therapy

## 2010-04-29 ENCOUNTER — Ambulatory Visit (HOSPITAL_COMMUNITY)
Admission: RE | Admit: 2010-04-29 | Discharge: 2010-04-29 | Disposition: A | Discharge status: Home / Self Care | Payer: Medicare Other | Source: Intra-hospital | Attending: Internal Medicine | Admitting: Internal Medicine

## 2010-04-29 DIAGNOSIS — D510 Vitamin B12 deficiency anemia due to intrinsic factor deficiency: Secondary | ICD-10-CM | POA: Insufficient documentation

## 2010-05-01 LAB — CBC
HCT: 38.2 % — ABNORMAL LOW (ref 39.0–52.0)
Hemoglobin: 13.4 g/dL (ref 13.0–17.0)
Hemoglobin: 15.9 g/dL (ref 13.0–17.0)
MCV: 90.1 fL (ref 78.0–100.0)
MCV: 90.6 fL (ref 78.0–100.0)
RBC: 5.12 MIL/uL (ref 4.22–5.81)
WBC: 11.1 10*3/uL — ABNORMAL HIGH (ref 4.0–10.5)
WBC: 5.7 10*3/uL (ref 4.0–10.5)

## 2010-05-01 LAB — PROTIME-INR: Prothrombin Time: 20.1 seconds — ABNORMAL HIGH (ref 11.6–15.2)

## 2010-05-01 LAB — BASIC METABOLIC PANEL
Chloride: 106 mEq/L (ref 96–112)
GFR calc non Af Amer: >60 mL/min (ref >60)
Glucose, Bld: 97 mg/dL (ref 70–99)
Potassium: 3.9 mEq/L (ref 3.5–5.1)
Sodium: 140 mEq/L (ref 135–145)

## 2010-05-02 LAB — BASIC METABOLIC PANEL
CO2: 27 mEq/L (ref 19–32)
Chloride: 103 mEq/L (ref 96–112)
GFR calc Af Amer: >60 mL/min (ref >60)
Glucose, Bld: 121 mg/dL — ABNORMAL HIGH (ref 70–99)
Sodium: 138 mEq/L (ref 135–145)

## 2010-05-04 LAB — GLUCOSE, CAPILLARY
Glucose-Capillary: 106 mg/dL — ABNORMAL HIGH (ref 70–99)
Glucose-Capillary: 115 mg/dL — ABNORMAL HIGH (ref 70–99)
Glucose-Capillary: 118 mg/dL — ABNORMAL HIGH (ref 70–99)
Glucose-Capillary: 133 mg/dL — ABNORMAL HIGH (ref 70–99)
Glucose-Capillary: 134 mg/dL — ABNORMAL HIGH (ref 70–99)
Glucose-Capillary: 138 mg/dL — ABNORMAL HIGH (ref 70–99)
Glucose-Capillary: 141 mg/dL — ABNORMAL HIGH (ref 70–99)
Glucose-Capillary: 87 mg/dL (ref 70–99)

## 2010-05-04 LAB — CBC
HCT: 34.7 % — ABNORMAL LOW (ref 39.0–52.0)
Hemoglobin: 13.8 g/dL (ref 13.0–17.0)
MCHC: 34 g/dL (ref 30.0–36.0)
MCHC: 34.4 g/dL (ref 30.0–36.0)
MCHC: 34.5 g/dL (ref 30.0–36.0)
MCV: 91.7 fL (ref 78.0–100.0)
MCV: 91.8 fL (ref 78.0–100.0)
Platelets: 155 10*3/uL (ref 150–400)
Platelets: 177 10*3/uL (ref 150–400)
Platelets: 185 10*3/uL (ref 150–400)
RBC: 3.68 MIL/uL — ABNORMAL LOW (ref 4.22–5.81)
RBC: 3.79 MIL/uL — ABNORMAL LOW (ref 4.22–5.81)
RDW: 12.5 % (ref 11.5–15.5)
RDW: 13.1 % (ref 11.5–15.5)
RDW: 13.1 % (ref 11.5–15.5)
WBC: 6.1 10*3/uL (ref 4.0–10.5)

## 2010-05-04 LAB — COMPREHENSIVE METABOLIC PANEL
ALT: 14 U/L (ref 0–53)
ALT: 18 U/L (ref 0–53)
AST: 16 U/L (ref 0–37)
AST: 18 U/L (ref 0–37)
Alkaline Phosphatase: 30 U/L — ABNORMAL LOW (ref 39–117)
BUN: 8 mg/dL (ref 6–23)
CO2: 25 mEq/L (ref 19–32)
CO2: 25 mEq/L (ref 19–32)
Calcium: 7.6 mg/dL — ABNORMAL LOW (ref 8.4–10.5)
Calcium: 7.9 mg/dL — ABNORMAL LOW (ref 8.4–10.5)
Chloride: 108 mEq/L (ref 96–112)
Creatinine, Ser: 0.65 mg/dL (ref 0.4–1.5)
Creatinine, Ser: 0.78 mg/dL (ref 0.4–1.5)
GFR calc Af Amer: >60 mL/min (ref >60)
GFR calc Af Amer: >60 mL/min (ref >60)
GFR calc non Af Amer: >60 mL/min (ref >60)
GFR calc non Af Amer: >60 mL/min (ref >60)
Glucose, Bld: 129 mg/dL — ABNORMAL HIGH (ref 70–99)
Sodium: 140 mEq/L (ref 135–145)
Sodium: 143 mEq/L (ref 135–145)
Total Bilirubin: 1.8 mg/dL — ABNORMAL HIGH (ref 0.3–1.2)
Total Protein: 5.1 g/dL — ABNORMAL LOW (ref 6.0–8.3)
Total Protein: 5.6 g/dL — ABNORMAL LOW (ref 6.0–8.3)

## 2010-05-04 LAB — BASIC METABOLIC PANEL
BUN: 11 mg/dL (ref 6–23)
BUN: 4 mg/dL — ABNORMAL LOW (ref 6–23)
CO2: 28 mEq/L (ref 19–32)
CO2: 30 mEq/L (ref 19–32)
Calcium: 8.2 mg/dL — ABNORMAL LOW (ref 8.4–10.5)
Chloride: 105 mEq/L (ref 96–112)
Chloride: 107 mEq/L (ref 96–112)
Creatinine, Ser: 0.51 mg/dL (ref 0.4–1.5)
GFR calc Af Amer: >60 mL/min (ref >60)
Glucose, Bld: 126 mg/dL — ABNORMAL HIGH (ref 70–99)
Glucose, Bld: 148 mg/dL — ABNORMAL HIGH (ref 70–99)
Potassium: 4 mEq/L (ref 3.5–5.1)
Sodium: 139 mEq/L (ref 135–145)

## 2010-05-04 LAB — DIFFERENTIAL
Eosinophils Absolute: 0 10*3/uL (ref 0.0–0.7)
Eosinophils Absolute: 0.1 10*3/uL (ref 0.0–0.7)
Eosinophils Relative: 1 % (ref 0–5)
Lymphocytes Relative: 10 % — ABNORMAL LOW (ref 12–46)
Lymphs Abs: 0.3 10*3/uL — ABNORMAL LOW (ref 0.7–4.0)
Lymphs Abs: 0.6 10*3/uL — ABNORMAL LOW (ref 0.7–4.0)
Monocytes Relative: 9 % (ref 3–12)
Monocytes Relative: 9 % (ref 3–12)
Neutrophils Relative %: 85 % — ABNORMAL HIGH (ref 43–77)

## 2010-05-04 LAB — CULTURE, BLOOD (ROUTINE X 2): Culture: NO GROWTH

## 2010-05-04 LAB — PROTIME-INR
INR: 1.5 (ref 0.00–1.49)
INR: 1.7 — ABNORMAL HIGH (ref 0.00–1.49)
INR: 2 — ABNORMAL HIGH (ref 0.00–1.49)
Prothrombin Time: 14.8 seconds (ref 11.6–15.2)
Prothrombin Time: 16.7 seconds — ABNORMAL HIGH (ref 11.6–15.2)
Prothrombin Time: 17.6 seconds — ABNORMAL HIGH (ref 11.6–15.2)
Prothrombin Time: 19.4 seconds — ABNORMAL HIGH (ref 11.6–15.2)

## 2010-05-04 LAB — BLOOD GAS, ARTERIAL
Drawn by: 145321
O2 Content: 4 L/min
pCO2 arterial: 44 mmHg (ref 35.0–45.0)
pH, Arterial: 7.421 (ref 7.350–7.450)

## 2010-05-04 LAB — MRSA CULTURE

## 2010-05-04 LAB — TRIGLYCERIDES: Triglycerides: 50 mg/dL (ref <150)

## 2010-05-04 LAB — PHOSPHORUS
Phosphorus: 1.5 mg/dL — ABNORMAL LOW (ref 2.3–4.6)
Phosphorus: 2.6 mg/dL (ref 2.3–4.6)
Phosphorus: 3.3 mg/dL (ref 2.3–4.6)

## 2010-05-04 LAB — POTASSIUM: Potassium: 4.1 mEq/L (ref 3.5–5.1)

## 2010-05-04 LAB — LIPASE, BLOOD: Lipase: 39 U/L (ref 11–59)

## 2010-05-04 LAB — MAGNESIUM: Magnesium: 1.9 mg/dL (ref 1.5–2.5)

## 2010-05-04 LAB — PREPARE FRESH FROZEN PLASMA

## 2010-05-05 LAB — CBC
Platelets: 215 10*3/uL (ref 150–400)
RDW: 13.1 % (ref 11.5–15.5)

## 2010-05-05 LAB — DIFFERENTIAL
Basophils Relative: 1 % (ref 0–1)
Eosinophils Absolute: 0 10*3/uL (ref 0.0–0.7)
Lymphs Abs: 0.4 10*3/uL — ABNORMAL LOW (ref 0.7–4.0)
Monocytes Absolute: 0.9 10*3/uL (ref 0.1–1.0)
Monocytes Relative: 7 % (ref 3–12)
Neutro Abs: 10.5 10*3/uL — ABNORMAL HIGH (ref 1.7–7.7)

## 2010-05-05 LAB — URINALYSIS, ROUTINE W REFLEX MICROSCOPIC
Hgb urine dipstick: NEGATIVE
Protein, ur: 100 mg/dL — AB
Urobilinogen, UA: 1 mg/dL (ref 0.0–1.0)

## 2010-05-05 LAB — COMPREHENSIVE METABOLIC PANEL
ALT: 19 U/L (ref 0–53)
Albumin: 3.5 g/dL (ref 3.5–5.2)
Alkaline Phosphatase: 48 U/L (ref 39–117)
Potassium: 3.8 mEq/L (ref 3.5–5.1)
Sodium: 137 mEq/L (ref 135–145)
Total Protein: 6.2 g/dL (ref 6.0–8.3)

## 2010-05-05 LAB — URINE CULTURE: Colony Count: >=100000

## 2010-05-05 LAB — PROTIME-INR: INR: 4 — ABNORMAL HIGH (ref 0.00–1.49)

## 2010-05-10 ENCOUNTER — Ambulatory Visit: Payer: 59 | Attending: Family Medicine | Admitting: Physical Therapy

## 2010-05-10 DIAGNOSIS — R279 Unspecified lack of coordination: Secondary | ICD-10-CM | POA: Insufficient documentation

## 2010-05-10 DIAGNOSIS — R293 Abnormal posture: Secondary | ICD-10-CM | POA: Insufficient documentation

## 2010-05-10 DIAGNOSIS — M2560 Stiffness of unspecified joint, not elsewhere classified: Secondary | ICD-10-CM | POA: Insufficient documentation

## 2010-05-10 DIAGNOSIS — IMO0001 Reserved for inherently not codable concepts without codable children: Secondary | ICD-10-CM | POA: Insufficient documentation

## 2010-05-17 ENCOUNTER — Ambulatory Visit: Payer: Medicare Other | Admitting: Physical Therapy

## 2010-05-24 ENCOUNTER — Ambulatory Visit: Payer: 59 | Admitting: Physical Therapy

## 2010-06-07 ENCOUNTER — Ambulatory Visit: Payer: Medicare Other | Attending: Family Medicine | Admitting: Physical Therapy

## 2010-06-07 DIAGNOSIS — R279 Unspecified lack of coordination: Secondary | ICD-10-CM | POA: Insufficient documentation

## 2010-06-07 DIAGNOSIS — R293 Abnormal posture: Secondary | ICD-10-CM | POA: Insufficient documentation

## 2010-06-07 DIAGNOSIS — IMO0001 Reserved for inherently not codable concepts without codable children: Secondary | ICD-10-CM | POA: Insufficient documentation

## 2010-06-07 DIAGNOSIS — M2560 Stiffness of unspecified joint, not elsewhere classified: Secondary | ICD-10-CM | POA: Insufficient documentation

## 2010-06-12 NOTE — H&P (Signed)
[name]           ACCOUNT NO.:  [id number]   MEDICAL RECORD NO.:  [id number]          PATIENT TYPE:  INP   LOCATION:  0101                         FACILITY:  [facility]   PHYSICIAN:  Cherene Altes, MD     DATE OF BIRTH:  December 14, 1980   DATE OF ADMISSION:  09/27/2008  DATE OF DISCHARGE:                              HISTORY & PHYSICAL   HISTORY OF PRESENT ILLNESS:  This 30 year old male resident of an adult  home for disabled adults presents to the emergency room with a 24-hour  course of worsening nausea.  This progressed to where he has had at  least six episodes of vomitus now.  Vomited several times since coming  to the emergency room.  He has a prior history of small-bowel infarct,  though, I cannot find a year, Careers adviser, or details in the computer  records.  Nonetheless, he was status post surgical repair with ARDS  postoperatively.  At baseline, he is wheelchair bound and requires full  assist with transfer.  He does not know who was following his primary  care at the facility, but per [facility] listed as Georgianne Fick, M.D.  Workup in the emergency room demonstrates some mild leukocytosis in the  11 range and CT scan of the abdomen and pelvis suggests a small bowel  obstruction.   PAST MEDICAL HISTORY:  1. Cerebral palsy.  2. 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  3. History ARDS postop bowel resection prior to 2000.  4. Coccyx ulcer November 2000 status post muscle flap repair.  5. Recurrent UTI.  6. Questionable C diff colitis in 2000.  7. 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   PAST SURGICAL HISTORY:  As above.   FAMILY HISTORY:  The patient is unable to provide with marked  dysarthria.   SOCIAL HISTORY:  Again, he lives in a group home for disabled adults.  There is no tobacco, alcohol, or illicit drugs.   REVIEW OF SYSTEMS:  I deferred this due to patient  dysarthria.  What I  know is listed under the history of present illness and provided by his  caregiver from the group home.   HOME MEDICATIONS:  Per facility MAR:  1. Benadryl 25 mg three times daily as needed.  2. Restoril 15 mg daily bedtime as needed.  3. Coumadin 3 mg on Monday, Tuesday, Thursday, Friday, Saturday,      Sunday and 1.5 mg on Wednesday.  4. Nizoral 2% cream twice daily for 2 weeks as needed.  5. Baclofen 15 mg twice daily.  6. Calcium 500 mg three times daily.  7. Flexeril 10 mg three times daily.  8. Diazepam 2 mg a.m. and 2 mg bedtime.  9. Fleet's glycerin suppository per rectum and at bedtime.  10.Tylenol 500 mg 2 tablets equaling 1,000 mg daily bedtime.  11.Fleet's enema per rectum as needed.  12.B12 injections 1000 mcg monthly.  13.Folic acid 1 mg daily.  14.Toprol XL 25 mg daily.  15.Macrodantin 50  mg daily.  16.Triamterene/HCTZ 37.5/25 mg 1 tablet daily a.m.  17.Baclofen 20 mg twice daily 8 a.m. and 12 noon.  18.Imodium 2 mg twice daily 8 a.m. and 12 noon.  19.Vitamin D 1000 international units twice daily.   ALLERGIES:  OPIATES/MORPHINE, CIPRO.   PHYSICAL EXAMINATION:  VITAL SIGNS:  Temperature 97.3, pulse 121,  respiration 18, blood pressure 132/92.  O2 sats 96% on room air.  GENERAL APPEARANCE:  This is a well-developed male in no acute distress.  Does complain of abdominal pain.  SKIN:  Turgor is adequate.  There are some scratch marks on his arm and  hand, particularly on the right.  I see no wounds or ulcers.  Though, I  did not view the buttocks.  HEENT:  Head normocephalic.  Eyes:  PERRLA.  Ears: Canals clear and  hearing grossly normal.  Nose:  Nares patent without masses or discharge  noted.  Mouth:  Oral mucosa pink and moist.  NECK/THYROID:  Neck is supple with no adenopathy or bruits noted.  Thyroid nonpalpable.  CARDIAC:  Tachycardiac though regular.  Apical rate about 120.  No  murmur, but I do hear an intermittent split S2.  Pedal  pulses are  depressed.  There is trace pedal edema.  LUNGS:  Clear and equal but weak inspiratory effort.  ABDOMEN:  Kidney high-pitched bowel sounds in the lower quadrants.  I do  not appreciate bowel sounds in the upper quadrants.  Abdomen is  distended and tense.  There is significant pain with some guarding and  rebound tenderness noted.  URINARY GENITAL:  Normal male genitalia.  He has a condom catheter in  place draining clear yellow urine.  The bladder is not distended and  there is no bladder area pain.  No CVA tenderness.  MUSCULOSKELETAL:  He has some flexion contractures at the shoulder and  elbows.  He also has decreased range of motion at the hips and knees,  likely flexion contractures.  Strength is globally decreased.  He is  unable to defy gravity with his legs and feet.  There is foot drop noted  bilaterally.  NEUROLOGIC:  Speech is dysarthric.  He is alert and oriented.  There is  no suggestion of mental retardation.  Deep tendon reflexes not obtained.  Toes nonreactive to Babinski.  PSYCHIATRIC:  No indication of depression or anxiety seen.  HEMATOLOGIC:  No indication of active bleeding seen.  Capillary refill  is 3+.   LABORATORY DATA:  Lipase 15.  PT 38.7, INR elevated to 4.0.  CBC found  WBC elevated 11.8, hemoglobin 15.5, hematocrit 45.9, platelets 215.  Neutrophil percent elevated at 89 and lymphocyte percent 3.  Metabolic  panel:  Sodium 137, potassium 3.8 and chloride 101, CO2 26, BUN 17,  creatinine 0.94.  Glucose elevated 129.  Total bili elevated at 1.4 with  other liver function normal.  GFR greater than 60.  Urine microscopic  showed many bacteria.  CT scan of the abdomen/pelvis showed dilated  stomach and small bowel with air-fluid levels consistent with high-grade  small-bowel obstruction.  Abdominal x-ray series noted dilated bowel  loops, question small bowel obstruction versus ileus.   IMPRESSION/PLAN:  1. High-grade small-bowel obstruction.   Would attempt to decompress      with NG tube to low intermittent wall suction.  Surgery consult      pending, but I believe has been called.  Will repeat a three-way      abdominal x-ray in a.m.  IV  fluids for hydration.  Normal saline at      75 cc/hour n.p.o. status.  Would start antibiotics empirically with      Flagyl and Rocephin IV pharmacy to dose.  Will check a lactic acid      level.  2. Coagulopathy.  Need to reduce the INR currently 4.0.  We are      holding the Coumadin and will give vitamin K 10 mg IM times one      dose and repeat a PT/INR in a.m.  Would use heparin 500 units every      8 hours for DV prophylaxis when INR less than to.  Will also use      SCDs and TED hose for DVT prophylaxis, noting prior history of DVT      times two at least.  The heparin will be the shortest half life if      surgery is required at some point.  3. Risk stratification.  Major risk factor is his multiple sclerosis      and hypoventilation as his respiratory muscles probably affected.      This suggested in his prior history of ARDS following his last      surgery.  This would put him in a fairly high risk operative      candidate classification.  No other significant risk factors other      than his coagulopathy that I see currently.  4. Leukocytosis.  Could be reactive with SB, although he has many      bacteria in his urine.  It is quite likely that he is colonized      there, however.  Will send urine for C&S and hold Macrodantin for      now.  Will obtain blood cultures x2.  Antibiotics as above.  5. Multiple sclerosis.  Will continue his Valium IV push but hold the      baclofen and Flexeril currently.  6. Insomnia.  I will hold his prior medication and use diazepam b.i.d.  7. Chronic constipation.  I think we can manage this with Fleet's      enema Dulcolax suppository, etc..  8. Hypertension.  Will hold the p.o. Toprol, but if possible, this is      also used for rate control  given his tachycardia.  Would consider      12.5 mg metoprolol IV twice daily and follow blood pressures and      pulse, but will confer with admitting physician prior to ordering.      Everett Graff, N.P.      Cherene Altes, MD  Electronically Signed    TC/MEDQ  D:  09/28/2008  T:  09/28/2008  Job:  [id number]

## 2010-06-12 NOTE — Consult Note (Signed)
NAMESKYY, MCKNIGHT           ACCOUNT NO.:  [id number]   MEDICAL RECORD NO.:  [id number]          PATIENT TYPE:  INP   LOCATION:  0101                         FACILITY:  [facility]   PHYSICIAN:  Angelia Mould. Derrell Lolling, M.D.DATE OF BIRTH:  1980/07/05   DATE OF CONSULTATION:  09/28/2008  DATE OF DISCHARGE:                                 CONSULTATION   REASON FOR CONSULTATION:  Evaluate abdominal distention and small bowel  obstruction.   HISTORY:  This is a 30 year old Caucasian male who is a resident of [facility] because of chronic disability from cerebral palsy.  He basically  is total care in a wheelchair with all 4 extremities disabled.  He is,  however, normal mentally and is a high Garment/textile technologist.   With that background, he has a significant past history of exploratory  laparotomy 10 years ago and resection of one-third of his small bowel by  Dr. Joanne Gavel for what was said to be an ischemic small bowel  obstruction.  Postop he had ARDS and skin breakdown and was hospitalized  for 2 months.   He now presents to the [facility] ED with a 24-hour history of  abdominal pain, abdominal distention, nausea, and vomiting.  He has been  somewhat anxious and tachycardic in the emergency room, but has been  otherwise perfectly stable.  A nasogastric tube was placed and has  drained a total of 1000 mL of thick bilious somewhat enteric content and  he said his pain has gone from a 10 out of 10 down to a 1 out of 10 and  he is very comfortable now.  He has only received IV Valium for  agitation and has not received any narcotics.   In the emergency room he had abdominal x-rays and a CT scan.  This  suggests a high-grade distal small bowel obstruction of unknown cause.  There is an old staple line near the terminal ileum and cecum.  There is  no free fluid.  There is no free air.  There is no bowel wall thickening  or pneumatosis.  There is a loop of small bowel above the right lobe of  the liver which is probably chronic.   He is anticoagulated on Coumadin chronically because of a history of  recurrent DVT.  He is being admitted by the [facility] and I  have been asked to consult regarding his small bowel obstruction.   PAST MEDICAL HISTORY:  1. Cerebral palsy.  2. History of ischemic small bowel obstruction approximately 10 years      ago, status post extensive resection in approximately the year 2000      by Dr. Joanne Gavel.  Records pending.  3. History of postop ARDS and multiple decubiti in the year 2000.  4. Status post rotation muscle flap to cover a sacral decubitus.  5. History of recurrent uric UTI.  6. History of recurrent DVT on Coumadin.  7. Questionable history of C. difficile colitis, remote.   HOME MEDICATIONS:  1. Benadryl 25 mg p.o. p.r.n.  2. Restoril 15 mg 1 p.o. at bedtime p.r.n.  3. Coumadin  3 mg Monday, Tuesday, Thursday, Friday, Saturday, Sunday      and 1.5 mg on Wednesday.  4. Nizoral 2% cream b.i.d. p.r.n.  5. Baclofen 15 mg p.o. b.i.d.  6. Calcium 500 mg t.i.d.  7. Flexeril 10 mg t.i.d.  8. Diazepam 2 mg at bedtime.  9. Fleet's glycerin suppositories p.r.n.  10.Tylenol p.r.n.  11.Fleet's enema p.r.n.  12.Vitamin B12 injection 1000 mcg monthly.  13.Valium 2 mg p.o. daily.  14.Folic acid 1 mg p.o. daily.  15.Toprol XL 25 mg daily.  16.Macrodantin 50 mg daily.  17.Triamterene/hydrochlorothiazide 37.5/25 mg 1 p.o. daily.  18.Imodium p.r.n. 8 a.m. and 12 noon.  19.Vitamin D 1000 units p.o. b.i.d.   DRUG ALLERGIES:  1. MORPHINE.  2. CIPRO.  3. OPIATES.   SOCIAL HISTORY:  No alcohol or tobacco.  Resident of a nursing facility.  Disabled with cerebral palsy.   FAMILY HISTORY:  Noncontributory.   REVIEW OF SYSTEMS:  A 10-system review of systems is performed and is  noncontributory except as described above.   PHYSICAL EXAMINATION:  GENERAL:  Alert, cooperative white male with  cerebral palsy.  He does not appear to be  in any distress at this  moment, but is somewhat anxious and agitated.  He is very cooperative,  answers questions as best he can, has a speech impediment.  VITAL SIGNS:  Temperature 97.0, blood pressure 161/100, heart rate 125,  regular sinus tachycardia, respiratory rate 18 and unlabored.  EYES:  Sclerae are clear.  Extraocular movements intact.  NECK:  Supple, nontender.  No mass, no jugular venous distention.  LUNGS:  Clear to auscultation.  No wheezing, no rhonchi.  No chest wall  tenderness.  HEART:  Regular tachycardia.  No ectopy.  No murmur heard.  Radial and  femoral pulses are palpable.  ABDOMEN:  There is a midline scar well-healed.  There is no hernia in  the abdomen or in the inguinal area.  He is distended and tympanitic,  but quite soft and he has almost no tenderness and absolutely has no  guarding and no rebound.  I do not feel a mass.  Liver and spleen are  not enlarged.  Bowel sounds are hypoactive.  EXTREMITIES:  Atrophic with some contracture.  NEUROLOGIC:  No gross acute deficits, but profound motor weakness  throughout with contracture.   ADMISSION LABORATORY DATA:  Abdominal films and CT scan are described  above.  Urinalysis shows a urinary tract infection.  Hemoglobin 15.5,  white blood cell count 11,800 with a left shift.  Prothrombin time 38.7,  INR 4.0.  BUN 17, creatinine 0.9, glucose 126, lipase 15.  Liver  function tests normal.   ASSESSMENT:  1. Recurrent small bowel obstruction.  I suspect this is probably due      to adhesions.  His examination, laboratory, and radiographic workup      is not consistent with any evidence of compromised bowel and I do      not think that he needs emergent exploration.  He has markedly      improved symptomatically with a nasogastric tube.  2. History of deep venous thrombosis, anticoagulated.  3. Cerebral palsy.  4. Status post laparotomy with small bowel resection 10 years ago.  5. History of postoperative acute  respiratory distress syndrome and      decubitus ulcer.  6. Recurrent urinary tract infection.   PLAN:  1. The patient will be admitted by the Medical Service to the step-  down ICU.  2. Nasogastric suction.  3. Aggressive fluid and electrolyte hydration.  4. Aggressive reversal of his anticoagulation with vitamin K and fresh      frozen plasma.  5. Reassess with physical exam and lab work within the next 8 hours or      so.  6. Reassess with physical exam and radiographic exam tomorrow morning.   He may need a laparotomy if he fails to resolve, which I am concerned  that this may happen, or if he becomes tender and shows any sign of  bowel compromise.      Angelia Mould. Derrell Lolling, M.D.  Electronically Signed     HMI/MEDQ  D:  09/28/2008  T:  09/28/2008  Job:  [id number]   cc:   [facility]

## 2010-06-15 NOTE — Discharge Summary (Signed)
[place]  Patient:    [name], [name] Visit Number: [id number] MRN: [id number]          Service Type: MED Location: [id number] Attending Physician:  Evette Georges Dictated by:   Justine Null, M.D. [facility] Admit Date:  04/24/2001 Discharge Date: 04/26/2001                             Discharge Summary  REASON FOR ADMISSION:  Ileus.  HISTORY OF PRESENT ILLNESS:  The patient is a 30 year old man, admitted by Dr. Tawanna Cooler on April 24, 2001, with ileus.  Please refer to his dictated History and Physical for details.  HOSPITAL COURSE:  The patient was admitted and treated with bowel prep and intravenous fluids.  On this, the patient had copious bowel movements, and his ileus resolved.  By April 26, 2001, the patient was alert, cooperative, eating his usual diet, and ready for discharge home.  He was therefore discharged home in fair condition.  DISCHARGE DIAGNOSES: 1. Ileus, resolved. 2. Other chronic medical problems as noted in the History and Physical.  MEDICATIONS:  B12 1 mg every month, multivitamin one daily, folic acid 1 mg daily, Imodium 2 mg tablets two in the morning, two at noon, and two at 4 p.m., Macrodantin 50 mg daily for UTI prophylaxis, Senna suppository one at h.s., Minocin 50 mg b.i.d., Tums 500 mg three times a day.  ACTIVITY:  The patient should be assisted from bed to his wheelchair.  DIET:  Low fat.  FOLLOWUP:  With Dr. Tawanna Cooler as needed.  OTHER INSTRUCTIONS:  The goal is that the patient will have one bowel movement every night. Dictated by:   Justine Null, M.D. [facility] Attending Physician:  Evette Georges DD:  04/26/01 TD:  04/27/01 Job: [id number]

## 2010-06-15 NOTE — Assessment & Plan Note (Signed)
[facility]                                 ON-CALL NOTE   NAME:[name], Michael                    MRN:          [id number]  DATE:11/29/2006                            DOB:          06/24/1980    PHONE NUMBER:  [phone]   Caller was Amy at [facility].   SUBJECTIVE:  The patient was readmitted on Wednesday, is out of  medicine.  He takes Coumadin 3 mg everyday and has no refills.  She is  not sure why it has not been refilled.  In talking with trying to get  medication, she wanted a prescription faxed to the [facility] which is  their prescription pharmacy.  I am unable to fax and would like to call  in a local prescription.  Grandparents are there and will pick up  prescription from the pharmacy.  I ended up finally calling it in at [facility]  at [address] [facility] [address] at [phone].  Prescription was for Coumadin  3 mg a day #30 and no refills.   PRIMARY CARE PHYSICIAN:  Jeffrey A. Todd, M.D., home office [place].     Robert N. Schaller, MD  Electronically Signed    RNS/MedQ  DD: 11/29/2006  DT: 11/30/2006  Job #: [id number] 

## 2010-06-15 NOTE — H&P (Signed)
[place]  Patient:    NU[facility], LIPTON Visit Number: [id number] MRN: [id number]          Service Type: EMS Location: [place] Attending Physician:  Carmelina Peal Dictated by:   Evette Georges, M.D. [facility] Admit Date:  04/23/2001 Discharge Date: 04/24/2001                           History and Physical  DATE OF BIRTH: Apr 02, 1980  PRIMARY CARE PHYSICIAN: Evette Georges, M.D.  CHIEF COMPLAINT: This is one of many [place]. 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  HISTORY OF PRESENT ILLNESS: The patient felt well until yesterday, when he noticed abdominal pain.  It increased in the evening and he went to the emergency room.  He was seen at [facility] Emergency Room and was found to be afebrile.  No vomiting.  No bowel movement for two days, with a somewhat distended abdomen and decreased bowel sounds.  Laboratory data was negative except for abdominal film showing an ileus with positive gallstones. He was felt to be okay and therefore was sent home on clear liquids and asked to follow up with [place] today.  His grandmother brings him in today.  (Mom and Dad are in [place], [place]).  Today he says he is no better.  He continues to have abdominal pain.  He describes it as diffuse.  It is not local.  He is somewhat queasy but had no vomiting and no diarrhea.  Indeed, he had no bowel movement in two to three days now.  The patient has had a history of a bowel infarction requiring major surgery three years ago.  At that time he developed sepsis and postoperative ARDS, and numerous areas of skin breakdown that required prolonged two month hospitalization.  He has been GI well otherwise since.  The only interim history is that he has had a DVT.   He was on Coumadin for 12 months and finally weaned off, has had no recurrence.  PAST MEDICAL HISTORY:  1. Past hospitalization, DVT as noted above.  2. Small bowel obstruction with subsequent bowel infarction requiring major     surgery and resection of part of his small bowel, with subsequent     secondary complications postoperative requiring ventilator assistance     because of ARDS, etc.  REVIEW OF SYSTEMS/FAMILY HISTORY/SOCIAL HISTORY/ETC.: Unchanged.  He is currently a full-time resident at [employer].  MEDICATIONS:  1. Vitamin B-12 1 cc q.monthly.  2. Multivitamin.  3. Folic acid 1 mg q.d.  4. Imodium 2 mg two tablets q.a.m., two at noon, and two at 4 p.m. to     prevent diarrhea.  5. Macrodantin 50 mg q.d. for prophylaxis for UTI.  6. Senna suppository one rectally q.h.s.  7. Minocin 50 mg b.i.d. for acne.  8. Tums 500 mg t.i.d.  PHYSICAL EXAMINATION:  VITAL SIGNS: TEMP 98 degrees, pulse 70 and regular, respirations 12 and regular, BP 120/80.  GENERAL: He is a well-developed, well-nourished male, able to vocalize his concerns.  Again, he has normal IQ.  HEENT: Negative.  NECK: Supple.  Thyroid not enlarged.  CHEST: Clear to auscultation.  CARDIAC: Negative except for pulmonic murmur.  He has history of subpulmonic stenosis and has seen  pediatric cardiology.  That murmur is unchanged.  ABDOMEN: Somewhat distended.  I can appreciate an occasional high-pitch tinkling bowel sound.  Otherwise, the abdomen is fairly quiet.  There is diffuse tenderness and no rebound.  IMPRESSION: Ileus, with history of bowel infarction which required subsequent surgery and removal of small bowel and subsequent adult respiratory distress syndrome postoperatively.  PLAN:  1. Admit.  2. IV fluids.  3. Bowel prep.  4. Also, with a history of DVT will start him on heparin subcu 5000 units     b.i.d. Dictated by:   Evette Georges, M.D. [facility] Attending Physician:  Carmelina Peal DD:  04/24/01 TD:  04/25/01 Job: [id number]

## 2010-06-15 NOTE — H&P (Signed)
NAME:  [name], [name]                     ACCOUNT NO.:  [id number]   MEDICAL RECORD NO.:  [id number]                   PATIENT TYPE:  EMS   LOCATION:  MINO                                 FACILITY:  [facility]   PHYSICIAN:  Wanda Plump, MD [facility]                 DATE OF BIRTH:  [birth year]   DATE OF ADMISSION:  03/13/2003  DATE OF DISCHARGE:                                HISTORY & PHYSICAL   PRIMARY CARE PHYSICIAN:  Tinnie Gens A. Tawanna Cooler, M.D. [facility]   CHIEF COMPLAINT:  Leg swelling.   HISTORY OF PRESENT ILLNESS:  Mr. Kanouse is a 30 year old white male with a  history of  cerebral palsy who has been  admitted  with a 2 day history of  left leg swelling without any pain or redness. An ultrasound done in the  emergency room showed an acute DVT. He is otherwise doing well.   MEDICAL HISTORY:  1. History of cerebral palsy with a normal IQ.  2. 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  3. Previous DVT of the left leg about 4 years ago, status post  Coumadin for     1 year.  4. History of extensive skin  breakdown  due to immobilization requiring     several months of inpatient treatment.   SOCIAL HISTORY:  Negative  for tobacco or alcohol.   FAMILY HISTORY:  Negative for other  family  members with DVTs or PEs that  we know of.   REVIEW OF SYSTEMS:  Basically negative except for a questionable lower  temperature today. No chest pain, cough or shortness of breath. No nausea or  vomiting or diarrhea. Appetite is normal.   MEDICATIONS:  1. Multivitamins once a day.  2. Macrodantin 50 mg 1 p.o. every day.  3. Folic acid 1 mg 1 p.o. every day.  4. B12 1000 micrograms every month.  5. Imodium as needed.  6. Flexeril which was started 2 weeks ago to help with spasms.  7. Minocycline 100 mg 1 p.o. t.i.d. which is prescribed apparently for acne.  8. Motrin, Tylenol and glycerine suppositories p.r.n.   ALLERGIES:  No known drug  allergies.   PHYSICAL EXAMINATION:  GENERAL:  The patient is alert, in no apparent  distress, cooperative and coherent.  VITAL SIGNS:  Temperature 99.5, pulse 132, later on decreased to 100, blood  pressure 124/77, respiratory rate 20, oxygen saturation on room air is 97%.  LUNGS:  Clear to auscultation bilaterally.  CARDIOVASCULAR:  Regular rate and rhythm without murmur.  ABDOMEN:  Nontender.  EXTREMITIES:  There is moderate swelling at the left lower extremity, mostly  about  the knee. This swelling is not pitting.   LABORATORY DATA:  D-dimer is 0.41. Venous Doppler shows an acute  DVT of the  left leg.   ASSESSMENT AND PLAN:  1. Deep venous  thrombosis without clinical evidence of pulmonary embolism.     Will admit him for subcu Lovenox and Coumadin.  2. He has a history of severe skin breakdown. The patient and the family are     very concerned about it. We will go ahead and put him on an air mattress     and I am going to consult the skin care team in the morning, because in     the past they were given sand tower mattress, which according to the     family was very helpful.  3. Will also contact the case manager to explore the option of new     outpatient treated with Lovenox. The reason to contact the case manager     is because of very expensive medication and we need to coordinate this     with his insurance.                                                Wanda Plump, MD [facility]    JEP/MEDQ  D:  03/13/2003  T:  03/13/2003  Job:  [id number]

## 2010-06-15 NOTE — Discharge Summary (Signed)
[place]  Patient:    [name]                   MRN: [id number] Adm. Date:  [id number] Disc. Date: 01/19/99 Attending:  Corwin Levins Dictator:   Cornell Barman, P.A. CC:         Janet Berlin. Dan Humphreys, M.D.             Evette Georges, M.D. [facility]                           Discharge Summary  DISCHARGE DIAGNOSES: 1. Diarrhea. 2. Sacral wound.  BRIEF HISTORY:  [name] is a 30 year old white male with cerebral palsy transferred from [facility] after a prolonged hospitalization secondary to a sacral ulcer.  For full details of hospitalization, please refer to the discharge summary.  PAST MEDICAL HISTORY:  Per previous H&Ps and discharge summaries.  HOSPITAL COURSE:  DECONDITIONING:  The patient was admitted to the subacute rehabilitation unit for rehabilitation prior to being discharged home. However, physical therapy did evaluate the patient and felt that range of motion exercise would not qualify as skills service.  Therefore, the patient was not seen by physical therapy.  Rather, while on this new unit, the patient underwent nursing education.  The patient did have a Panda in place prior to receiving tube feeds  before being transferred to [facility].  This was discontinued as the patient was tolerating a p.o. diet.  Also, the patient still had IV access, and  this was discontinued as well.  The patient was now able to get out of bed and nto his chair, and this was done while the patient was on the subacute rehabilitation unit.  Dr. Dan Humphreys felt that the patient was doing well from a plastics standpoint and felt that the patient could be discharged from his standpoint.  From a nutrition standpoint, the patient was consuming 100% of estimated protein and caloric needs.  The patient was felt to be stable for discharge, and arrangements were made.  The patient will have a PSA by home health care R.N.  This will  be arranged at discharge.  DISCHARGE MEDICATIONS: 1. Flagyl 250 mg 4 x a day for seven days. 2. Multivitamin with iron q.d. 3. Glycerin liquid as at home. 4. ______ 30 gm topically.  FOLLOWUP:  The patient is to follow up with Dr. Tawanna Cooler in the next few weeks and o follow up with Dr. Dan Humphreys as instructed. DD:  01/19/99 TD:  01/22/99 Job: [id number]

## 2010-06-15 NOTE — H&P (Signed)
[place]  Patient:    [name]                   MRN: [id number] Adm. Date:  [id number] Attending:  Corwin Levins CC:         Evette Georges, M.D. [facility]                         History and Physical  CHIEF COMPLAINT:  Left leg swelling.  HISTORY OF PRESENT ILLNESS:  [name] is a 30 year old white male with history f 24-hour history of left leg swelling.  No chest pain, shortness of breath, and nly minor discomfort only to the leg.  Venous Dopplers today in the ER consistent with DVT to the common femoral vein.  He is status post recent hospitalization October 2000 with small-bowel partial surgery, one-third for obstruction, status post extended SACU stay, complicated by coccyx ulcer and muscular flap.  PAST MEDICAL HISTORY:  1. Cerebral palsy.  2. October 2000, small-bowel resection for obstruction, as above.  3. November 2000, coccyx ulcer.  Status post muscle flap.  4. Recurrent UTIs.  5. Questionable recent C. difficile diarrhea.  Finishing Flagyl.  ALLERGIES:  No known drug allergies.  MEDICATIONS:  1. Ativan 0.5 mg p.o. q.h.s. p.r.n.  2. Flagyl 500 mg p.o. b.i.d. for 2 more days.  3. Macrodantin in the past for UTI.  SOCIAL HISTORY:  No tobacco.  No alcohol.  Lives with parents.  High school graduate.  FAMILY HISTORY:  Cancer and heart disease.  REVIEW OF SYSTEMS:  Otherwise noncontributory.  PHYSICAL EXAMINATION:  GENERAL:  [name] is a 30 year old white male.  He is alert.  Moves all fours.  VITAL SIGNS:  Blood pressure 132/64, heart rate 94, respirations 20, afebrile.  HEENT:  Sclerae clear.  TMs clear.  Pharynx benign.  NECK:  Without JVD.  He has just had a right internal jugular line pulled two days ago.  CHEST:  No rales or wheezing.  CARDIAC:  Regular rate and rhythm.  ABDOMEN:  Soft and nontender.  Positive bowel sounds.  No organomegaly.  No masses.  EXTREMITIES:  He has 2+ edema in the  left lower extremity below the hip.  LABORATORY:  None done prior to this visit.  ASSESSMENT AND PLAN:  Left lower extremity deep venous thrombosis.  Will check labs, chest x-ray.  Start Lovenox subcu injections and ___________ a saline lock tonight.  Pending when he can go home, may get Lovenox and Coumadin at home or elt need to stay may require access with central line as he has severe difficulty maintaining his access in the upper extremities due to the CP.  Have consulted pulmonary for this.  Will also have [facility] bed. DD:  01/22/99 TD:  01/23/99 Job: [id number]

## 2010-06-15 NOTE — Discharge Summary (Signed)
NAME:  Michael, [name]                     ACCOUNT NO.:  [id number]   MEDICAL RECORD NO.:  [id number]                   PATIENT TYPE:  INP   LOCATION:  5002                                 FACILITY:  [facility]   PHYSICIAN:  Wanda Plump, MD [facility]                 DATE OF BIRTH:  Nov 20, 1980   DATE OF ADMISSION:  03/13/2003  DATE OF DISCHARGE:  03/14/2003                                 DISCHARGE SUMMARY   DISCHARGE DIAGNOSES:  Left lower extremity deep vein thrombosis.   BRIEF ADMISSION HISTORY:  Mr. Edelman is a 30 year old white male with a  history of cerebral palsy who is wheelchair bound.  He is a resident at [facility].  He presents with a 2-day history of left lower extremity swelling.  The patient has a history of left lower extremity DVT in the year 2000, and  was treated with Coumadin for one year.   PAST MEDICAL HISTORY:  1. Cerebral palsy with normal IQ.  2. History of bowel infarction status post surgery.  3. History of ARDS postoperatively.  4. Skin of breakdown secondary.   HOSPITAL COURSE:  Left lower extremity deep vein thrombosis.  The patient  has been started on Lovenox and Coumadin.  The patient shows no evidence of  pulmonary embolus with good O2 saturations greater than 98%.  The patient is  felt to be stable for discharge back to [facility] with complete  anticoagulation with Lovenox and Coumadin.   LABORATORY DATA:  At discharge, CBC was normal.  Coags were normal.   MEDICATIONS AT DISCHARGE:  1. Lovenox 65 mg subcutaneous q.12h.  2. Coumadin 3 mg two tablets as directed per Dr. Tawanna Cooler.  3. Vitamins as at home.  4. Macrodantin 50 mg daily.  5. Folic acid 1 mg daily.  6. B-12, 1000 mcg q month.  7. Otherwise, medications as at home.   FOLLOW UP:  With Dr. Tawanna Cooler as needed.      Cornell Barman, P.A. [facility]                  Wanda Plump, MD [facility]    LC/MEDQ  D:  03/14/2003  T:  03/14/2003  Job:  [id number]   cc:   Tinnie Gens A. Tawanna Cooler, M.D. [facility]

## 2010-06-15 NOTE — Op Note (Signed)
[name], ABALOS           ACCOUNT NO.:  [id number]   MEDICAL RECORD NO.:  [id number]          PATIENT TYPE:  OIB   LOCATION:  2899                         FACILITY:  [facility]   PHYSICIAN:  Conley Simmonds, D.D.S.DATE OF BIRTH:  Aug 02, 1980   DATE OF PROCEDURE:  03/14/2004  DATE OF DISCHARGE:  03/14/2004                                 OPERATIVE REPORT   SURGEON:  Conley Simmonds, D.D.S.   ASSISTANT:  Dawn and Helmut Muster.   PREOPERATIVE DIAGNOSIS:  Cerebral palsy, dental caries, periodontal disease.   POSTOPERATIVE DIAGNOSIS:  Cerebral palsy, dental caries, periodontal  disease.    The patient was brought to the operating room and anesthesia was begun using  nasotracheal intubation.  The child received a complete oral examination and  prophylaxis and fluoride treatment at the end of the operative treatment  using a fluoride varnish.  The eyes were taped shut and padded with ointment  through the entire procedure.  Any x-rays involved the use of a lead apron  covering the child's neck and torso.  A throat pack was in place for the  entire procedure.   The following teeth were dealt with in the following manner:  Tooth 2  sealant.  Tooth 18 occlusal composite restoration with Dycal base.  Teeth 23  and 24 were extracted due to periodontal involvement, this involved laying a  flap to facilitate the removal of the root tip of 23 which fractured on  extraction.  The area was sutured with 4-0 chromic suture.  Complete  hemostasis was obtained using suturing.  Before any treatment was performed,  a complete set of dental x-rays were taken, they were developed in the  operating room, and the findings were consistent with the clinical findings.  At the end of the procedure, the oropharyngeal area was thoroughly evacuated  and when no debris remained, the throat pack was removed.  [name] was  taken to the recovery room in good condition.  A complete set of  postoperative oral and  written instructions were provided to his  grandparents and [employer].  1 mL of 2% lidocaine with 1:100,000  epinephrine was used in the extraction area for bleeding control.  The  justification for the use of general anesthesia was this adults inability to  have treatment in the dental office due to cerebral palsy.  Estimated blood  loss from the extraction was 1-2 mL.      EMM/MEDQ  D:  03/14/2004  T:  03/14/2004  Job:  [id number]

## 2010-06-15 NOTE — Assessment & Plan Note (Signed)
[facility]                                 ON-CALL NOTE   NAME:[name], [name]                    MRN:          [id number]  DATE:11/29/2006                            DOB:          1980/06/22    PHONE NUMBER:  [phone]   Caller was Amy at [facility].   SUBJECTIVE:  The patient was readmitted on Wednesday, is out of  medicine.  He takes Coumadin 3 mg everyday and has no refills.  She is  not sure why it has not been refilled.  In talking with trying to get  medication, she wanted a prescription faxed to the [facility] which is  their prescription pharmacy.  I am unable to fax and would like to call  in a local prescription.  Grandparents are there and will pick up  prescription from the pharmacy.  I ended up finally calling it in at [facility]  at [facility] [place] at [phone].  Prescription was for Coumadin  3 mg a day #30 and no refills.   PRIMARY CARE PHYSICIAN:  Tinnie Gens A. Tawanna Cooler, M.D., home office [place].     Arta Silence, MD  Electronically Signed    RNS/MedQ  DD: 11/29/2006  DT: 11/30/2006  Job #: [id number]

## 2010-09-11 ENCOUNTER — Emergency Department (HOSPITAL_COMMUNITY): Payer: 59

## 2010-09-11 ENCOUNTER — Inpatient Hospital Stay (HOSPITAL_COMMUNITY)
Admission: EM | Admit: 2010-09-11 | Discharge: 2010-09-19 | DRG: 571 | Disposition: A | Discharge status: Skilled Nursing Facility | Payer: 59 | Source: Ambulatory Visit | Attending: Internal Medicine | Admitting: Internal Medicine

## 2010-09-11 DIAGNOSIS — G809 Cerebral palsy, unspecified: Secondary | ICD-10-CM | POA: Diagnosis present

## 2010-09-11 DIAGNOSIS — Z993 Dependence on wheelchair: Secondary | ICD-10-CM

## 2010-09-11 DIAGNOSIS — L02419 Cutaneous abscess of limb, unspecified: Secondary | ICD-10-CM | POA: Diagnosis present

## 2010-09-11 DIAGNOSIS — I498 Other specified cardiac arrhythmias: Secondary | ICD-10-CM | POA: Diagnosis present

## 2010-09-11 DIAGNOSIS — N319 Neuromuscular dysfunction of bladder, unspecified: Secondary | ICD-10-CM | POA: Diagnosis present

## 2010-09-11 DIAGNOSIS — M2450 Contracture, unspecified joint: Secondary | ICD-10-CM | POA: Diagnosis present

## 2010-09-11 DIAGNOSIS — R197 Diarrhea, unspecified: Secondary | ICD-10-CM | POA: Diagnosis present

## 2010-09-11 DIAGNOSIS — IMO0002 Reserved for concepts with insufficient information to code with codable children: Principal | ICD-10-CM | POA: Diagnosis present

## 2010-09-11 DIAGNOSIS — R7881 Bacteremia: Secondary | ICD-10-CM | POA: Diagnosis present

## 2010-09-11 DIAGNOSIS — A4901 Methicillin susceptible Staphylococcus aureus infection, unspecified site: Secondary | ICD-10-CM | POA: Diagnosis present

## 2010-09-11 DIAGNOSIS — L03119 Cellulitis of unspecified part of limb: Secondary | ICD-10-CM | POA: Diagnosis present

## 2010-09-11 DIAGNOSIS — I10 Essential (primary) hypertension: Secondary | ICD-10-CM | POA: Diagnosis present

## 2010-09-11 DIAGNOSIS — Z7901 Long term (current) use of anticoagulants: Secondary | ICD-10-CM

## 2010-09-11 DIAGNOSIS — Z86718 Personal history of other venous thrombosis and embolism: Secondary | ICD-10-CM

## 2010-09-11 DIAGNOSIS — R3981 Functional urinary incontinence: Secondary | ICD-10-CM | POA: Diagnosis present

## 2010-09-11 HISTORY — PX: INCISE AND DRAIN ABCESS: PRO64

## 2010-09-11 LAB — DIFFERENTIAL
Eosinophils Relative: 1 % (ref 0–5)
Lymphocytes Relative: 10 % — ABNORMAL LOW (ref 12–46)
Lymphs Abs: 1.1 10*3/uL (ref 0.7–4.0)
Neutro Abs: 8.9 10*3/uL — ABNORMAL HIGH (ref 1.7–7.7)

## 2010-09-11 LAB — TYPE AND SCREEN: Antibody Screen: NEGATIVE

## 2010-09-11 LAB — BASIC METABOLIC PANEL
Calcium: 9.2 mg/dL (ref 8.4–10.5)
GFR calc Af Amer: >60 mL/min (ref >60)
GFR calc non Af Amer: >60 mL/min (ref >60)
Potassium: 4.1 mEq/L (ref 3.5–5.1)

## 2010-09-11 LAB — PROTIME-INR
INR: 2.28 — ABNORMAL HIGH (ref 0.00–1.49)
Prothrombin Time: 25.5 seconds — ABNORMAL HIGH (ref 11.6–15.2)

## 2010-09-11 LAB — CBC
HCT: 40.6 % (ref 39.0–52.0)
Hemoglobin: 14.1 g/dL (ref 13.0–17.0)
MCV: 88.5 fL (ref 78.0–100.0)
RDW: 13.4 % (ref 11.5–15.5)
WBC: 11.1 10*3/uL — ABNORMAL HIGH (ref 4.0–10.5)

## 2010-09-11 LAB — LACTIC ACID, PLASMA: Lactic Acid, Venous: 3.3 mmol/L — ABNORMAL HIGH (ref 0.5–2.2)

## 2010-09-12 DIAGNOSIS — Z86718 Personal history of other venous thrombosis and embolism: Secondary | ICD-10-CM

## 2010-09-12 DIAGNOSIS — IMO0002 Reserved for concepts with insufficient information to code with codable children: Secondary | ICD-10-CM

## 2010-09-12 HISTORY — PX: ELBOW SURGERY: SHX618

## 2010-09-12 LAB — COMPREHENSIVE METABOLIC PANEL
Albumin: 3.1 g/dL — ABNORMAL LOW (ref 3.5–5.2)
BUN: 8 mg/dL (ref 6–23)
Creatinine, Ser: 0.69 mg/dL (ref 0.50–1.35)
Total Protein: 6 g/dL (ref 6.0–8.3)

## 2010-09-12 LAB — PROTIME-INR
INR: 1.9 — ABNORMAL HIGH (ref 0.00–1.49)
Prothrombin Time: 22.1 seconds — ABNORMAL HIGH (ref 11.6–15.2)

## 2010-09-12 LAB — CBC
HCT: 35 % — ABNORMAL LOW (ref 39.0–52.0)
MCHC: 33.4 g/dL (ref 30.0–36.0)
MCV: 89.1 fL (ref 78.0–100.0)
RDW: 13.5 % (ref 11.5–15.5)

## 2010-09-13 ENCOUNTER — Inpatient Hospital Stay (HOSPITAL_COMMUNITY): Payer: 59

## 2010-09-13 DIAGNOSIS — L0291 Cutaneous abscess, unspecified: Secondary | ICD-10-CM

## 2010-09-13 DIAGNOSIS — R000 Tachycardia, unspecified: Secondary | ICD-10-CM

## 2010-09-13 DIAGNOSIS — L0390 Cellulitis, unspecified: Secondary | ICD-10-CM

## 2010-09-13 LAB — BASIC METABOLIC PANEL
CO2: 26 mEq/L (ref 19–32)
Chloride: 103 mEq/L (ref 96–112)
GFR calc Af Amer: >60 mL/min (ref >60)
Potassium: 3.8 mEq/L (ref 3.5–5.1)
Sodium: 138 mEq/L (ref 135–145)

## 2010-09-13 LAB — PREPARE FRESH FROZEN PLASMA: Unit division: 0

## 2010-09-13 LAB — CBC
HCT: 35.1 % — ABNORMAL LOW (ref 39.0–52.0)
MCH: 29.6 pg (ref 26.0–34.0)
MCHC: 33.6 g/dL (ref 30.0–36.0)
MCV: 88 fL (ref 78.0–100.0)
RDW: 13 % (ref 11.5–15.5)

## 2010-09-13 LAB — DIFFERENTIAL
Basophils Absolute: 0 10*3/uL (ref 0.0–0.1)
Eosinophils Relative: 4 % (ref 0–5)
Lymphocytes Relative: 19 % (ref 12–46)
Monocytes Absolute: 0.8 10*3/uL (ref 0.1–1.0)
Monocytes Relative: 9 % (ref 3–12)

## 2010-09-13 LAB — LACTIC ACID, PLASMA: Lactic Acid, Venous: 1.2 mmol/L (ref 0.5–2.2)

## 2010-09-13 LAB — VANCOMYCIN, TROUGH: Vancomycin Tr: 14.5 ug/mL (ref 10.0–20.0)

## 2010-09-13 NOTE — Op Note (Signed)
  [name], [name]           ACCOUNT NO.:  [id number]  MEDICAL RECORD NO.:  [id number]  LOCATION:  2917                         FACILITY:  [facility]  PHYSICIAN:  Juanetta Gosling, MDDATE OF BIRTH:  1980-02-07  DATE OF PROCEDURE:  09/12/2010 DATE OF DISCHARGE:                              OPERATIVE REPORT   PREOPERATIVE DIAGNOSIS:  Left upper extremity abscess.  POSTOPERATIVE DIAGNOSIS:  Left upper extremity abscess.  PROCEDURES: 1. Left upper extremity abscess incision and drainage. 2. Debridement of 4 x 3 cm area of subcutaneous tissue.  SURGEON:  Juanetta Gosling, MD  ASSISTANT:  None.  ANESTHESIA:  General.  SPECIMENS:  Cultures to Microbiology.  ESTIMATED BLOOD LOSS:  Minimal.  COMPLICATIONS:  None.  DRAINS:  None.  DISPOSITION:  To recovery room in stable condition.  INDICATIONS:  This is a 30 year old male with cerebral palsy who presented last night with a left upper extremity abscess that was draining.  He is on Coumadin for DVTs and was reversed overnight.  I met him and his mother this morning and we discussed incision and drainage in the operating room.  DESCRIPTION OF PROCEDURE:  After informed consent was obtained, the patient was taken to the operating room.  He was administered vancomycin and Zosyn on the floor.  He was then placed under general anesthesia without complication.  His left arm was prepped and draped in a standard sterile surgical fashion.  Surgical time-out was then performed.  I then made an incision into the area that had been draining.  There was purulence that came out when I did this.  There was also some necrotic tissue surrounding that, so I ended up excising this about 4 x 3 cm area of subcutaneous tissue down to his fascia.  This was completely drained. I took cultures of this at that time.  Following this, I then obtained hemostasis which was somewhat difficult given the friability of the area as well as his  history of a Coumadin use even though he had been reversed.  I then packed this after irrigating it and wrapped this.  He tolerated this well, was extubated in the operating room, and transferred to the recovery room in stable condition.     Juanetta Gosling, MD     MCW/MEDQ  D:  09/12/2010  T:  09/12/2010  Job:  [id number]  Electronically Signed by Emelia Loron MD on 09/13/2010 02:21:05 PM

## 2010-09-14 DIAGNOSIS — IMO0002 Reserved for concepts with insufficient information to code with codable children: Secondary | ICD-10-CM

## 2010-09-14 LAB — CULTURE, BLOOD (ROUTINE X 2)

## 2010-09-14 LAB — CBC
MCH: 29.5 pg (ref 26.0–34.0)
MCV: 88.8 fL (ref 78.0–100.0)
Platelets: 170 10*3/uL (ref 150–400)
RBC: 3.93 MIL/uL — ABNORMAL LOW (ref 4.22–5.81)
RDW: 13.2 % (ref 11.5–15.5)

## 2010-09-14 LAB — BASIC METABOLIC PANEL
CO2: 27 mEq/L (ref 19–32)
Calcium: 8.2 mg/dL — ABNORMAL LOW (ref 8.4–10.5)
Creatinine, Ser: 0.68 mg/dL (ref 0.50–1.35)
GFR calc Af Amer: >60 mL/min (ref >60)
GFR calc non Af Amer: >60 mL/min (ref >60)
Sodium: 139 mEq/L (ref 135–145)

## 2010-09-14 LAB — HEPARIN LEVEL (UNFRACTIONATED)
Heparin Unfractionated: 0.29 IU/mL — ABNORMAL LOW (ref 0.30–0.70)
Heparin Unfractionated: 0.31 IU/mL (ref 0.30–0.70)

## 2010-09-14 NOTE — Progress Notes (Signed)
[name], [name]           ACCOUNT NO.:  [id number]  MEDICAL RECORD NO.:  [id number]  LOCATION:  2917                         FACILITY:  [facility]  PHYSICIAN:  Jeoffrey Massed, MD    DATE OF BIRTH:  13-Nov-1980                                PROGRESS NOTE   The patient's ongoing medical issues include the following: 1. Left elbow abscess status post I and D. 2. MSSA bacteremia. 3. Sinus tachycardia probably secondary to underlying SIRS syndrome,     now resolved, however, the patient also on beta-blocker. 4. 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     Venous Doppler of his left lower extremity negative for acute DVT     this admission. 5. History of hypertension, currently maintained on metoprolol. 6. History of neurogenic bladder, chronically has a condom catheter in     place. 7. History of cerebral palsy essentially bed-bound, has involuntary     contractions of all of his extremities. 8. Chronic right bundle branch block. 9. Leukocytosis, now resolved.  CONSULTANTS ON THE CASE: 1. Dr. Daiva Eves from Infectious Disease. 2. Dr. Emelia Loron from [place] [facility].  PROCEDURES PERFORMED: The patient underwent a left upper extremity abscess incision and drainage on September 12, 2010.  PERTINENT LABORATORY DATA: 1. Blood cultures done on September 11, 2010, one set is positive for     staph aureus which is oxacillin sensitive. 2. Culture of the abscess done on August 15, Gram stain is positive     for moderate staph aureus, sensitivities are currently pending. 3. Lactic acid on admission was 3.3. 4. Lab work done today shows a CBC of 5.9, hemoglobin of 11.6,     hematocrit of 34.9, and platelet count of 170,000. 5. Chemistries done today shows a sodium of 139, potassium of 4.0,     chloride of 106, bicarb of 27, glucose of 93, BUN of 4, creatinine     of 0.68, and a calcium of 8.2.  BRIEF HOSPITAL COURSE: 1. Left elbow region abscess status  post I and D.  The patient was     actually brought into the hospital for evaluation of left lower     extremity swelling, however, during evaluation in the ED was found     to have an abscess of his left elbow region.  A bedside incision     and drainage was attempted in the ED, however, it was unsuccessful     and hence the patient was admitted to the Hospitalist Service with     [facility] consulting.  The patient was empirically     started on broad-spectrum antibiotics vancomycin and Zosyn and     admitted to the Hospitalist Service and to a step-down unit.  The     patient subsequently underwent incision and drainage next day.  The     patient also had blood cultures drawn this admission which was     subsequently positive for staph.  He has been doing well     postoperatively and [facility] is managing his postop     issues in regards to the abscess. 2. MSSA bacteremia.  As noted above, the patient did  have     leukocytosis, was tachycardic with elevated lactic acid levels on     admission.  Subsequent blood cultures, 1 set of which have come     back positive for MSSA.  Antibiotics have been changed to Ancef.     ID consultation has been obtained.  At this point, it is this     recommendation of ID that this patient undergo a TEE.  Subsequently     [facility] has been contacted to see if this patient can     undergo TEE on Monday.  The total length of IV antibiotics     recommended by ID is 20 days for now.  The patient does have a central     venous catheter in his right internal jugular vein that was placed     on August 16.  Dr. Daiva Eves recommended that once his repeat blood     cultures that will be sent off today come back hopefully negative,     this should also be replaced and possibly changed to his left neck     area for further continued antibiotics.  ID will follow this     patient and further recommendations will follow as well. 3.  History of DVT.  The patient was started on heparin infusion     without bolus after his incision and drainage.  As noted above, a     left lower extremity venous Doppler was done to assess his left     thigh swelling and this is negative for acute DVT.  Apparently this     patient has had all his prior DVTs in his left leg and perhaps this     left leg is chronically swollen from chronic venous stasis as well. 4. History of hypertension.  The patient is maintained on metoprolol     with adequate control of his blood pressure. 5. Sinus tachycardia.  This is probably multifactorial but mostly     secondary to underlying sepsis.  Today his heart rate is     significantly better.  He is also afebrile and his leukocytosis has     subsequently come back down to normal.  A transthoracic     echocardiogram done yesterday showed an EF around 55-60%. 6. History of cerebral palsy.  The patient is maintained on baclofen,     Flexeril, and Valium.  He unfortunately from this is essentially     bed-bound and gets involuntary jerking movements of all of his     extremities and as a result at the request of both the family and     the patient, the patient has been on soft restraints. 7. Neurogenic bladder with prior history of frequent UTIs.  The     patient is on nitrofurantoin as chronic suppressive therapy and     chronically has a condom catheter in place.  DISPOSITION: It is felt that this patient has met maximal benefit from his step-down unit stay and is stable to be transferred to a regular medical bed.  Total time spent today equals 45 minutes.     Jeoffrey Massed, MD     SG/MEDQ  D:  09/14/2010  T:  09/14/2010  Job:  [id number]  Electronically Signed by Jeoffrey Massed  on 09/14/2010 02:33:05 PM

## 2010-09-15 LAB — CBC
MCV: 87.5 fL (ref 78.0–100.0)
Platelets: 200 10*3/uL (ref 150–400)
RBC: 4 MIL/uL — ABNORMAL LOW (ref 4.22–5.81)
RDW: 12.8 % (ref 11.5–15.5)
WBC: 4.8 10*3/uL (ref 4.0–10.5)

## 2010-09-15 LAB — HEPARIN LEVEL (UNFRACTIONATED): Heparin Unfractionated: 1.23 IU/mL — ABNORMAL HIGH (ref 0.30–0.70)

## 2010-09-15 LAB — BASIC METABOLIC PANEL
CO2: 29 mEq/L (ref 19–32)
Chloride: 107 mEq/L (ref 96–112)
Creatinine, Ser: 0.67 mg/dL (ref 0.50–1.35)
GFR calc Af Amer: >60 mL/min (ref >60)
Sodium: 141 mEq/L (ref 135–145)

## 2010-09-15 LAB — SEDIMENTATION RATE: Sed Rate: 17 mm/hr — ABNORMAL HIGH (ref 0–16)

## 2010-09-15 LAB — CULTURE, ROUTINE-ABSCESS

## 2010-09-15 LAB — HIV ANTIBODY (ROUTINE TESTING W REFLEX): HIV: NONREACTIVE

## 2010-09-16 LAB — CBC
HCT: 38.1 % — ABNORMAL LOW (ref 39.0–52.0)
MCHC: 34.1 g/dL (ref 30.0–36.0)
MCV: 87.8 fL (ref 78.0–100.0)
Platelets: 205 10*3/uL (ref 150–400)
RDW: 12.8 % (ref 11.5–15.5)

## 2010-09-16 LAB — PROTIME-INR: Prothrombin Time: 16.5 seconds — ABNORMAL HIGH (ref 11.6–15.2)

## 2010-09-17 LAB — CULTURE, BLOOD (ROUTINE X 2)
Culture  Setup Time: 201208142351
Culture: NO GROWTH

## 2010-09-17 LAB — CBC
HCT: 36 % — ABNORMAL LOW (ref 39.0–52.0)
Hemoglobin: 12.1 g/dL — ABNORMAL LOW (ref 13.0–17.0)
MCH: 29.3 pg (ref 26.0–34.0)
MCHC: 33.6 g/dL (ref 30.0–36.0)
RDW: 13 % (ref 11.5–15.5)

## 2010-09-17 LAB — ANAEROBIC CULTURE

## 2010-09-18 ENCOUNTER — Inpatient Hospital Stay (HOSPITAL_COMMUNITY): Payer: 59

## 2010-09-18 LAB — CBC
Hemoglobin: 12.9 g/dL — ABNORMAL LOW (ref 13.0–17.0)
MCH: 29.8 pg (ref 26.0–34.0)
MCV: 88.9 fL (ref 78.0–100.0)
RBC: 4.33 MIL/uL (ref 4.22–5.81)

## 2010-09-18 LAB — HEPARIN LEVEL (UNFRACTIONATED): Heparin Unfractionated: 0.21 IU/mL — ABNORMAL LOW (ref 0.30–0.70)

## 2010-09-19 LAB — CBC
MCH: 29.7 pg (ref 26.0–34.0)
MCV: 89.4 fL (ref 78.0–100.0)
Platelets: 223 10*3/uL (ref 150–400)
RBC: 4.04 MIL/uL — ABNORMAL LOW (ref 4.22–5.81)

## 2010-09-19 LAB — PROTIME-INR
INR: 1.96 — ABNORMAL HIGH (ref 0.00–1.49)
Prothrombin Time: 22.7 seconds — ABNORMAL HIGH (ref 11.6–15.2)

## 2010-09-19 LAB — HEPARIN LEVEL (UNFRACTIONATED): Heparin Unfractionated: 0.49 IU/mL (ref 0.30–0.70)

## 2010-09-20 LAB — CULTURE, BLOOD (ROUTINE X 2)
Culture  Setup Time: 201208172002
Culture: NO GROWTH

## 2010-09-25 ENCOUNTER — Telehealth: Payer: Self-pay | Admitting: *Deleted

## 2010-09-25 NOTE — Consult Note (Signed)
  [name], [name]           ACCOUNT NO.:  [id number]  MEDICAL RECORD NO.:  [id number]  LOCATION:  2917                         FACILITY:  [facility]  PHYSICIAN:  Ollen Gross. Vernell Morgans, M.D. DATE OF BIRTH:  [birth year]  DATE OF CONSULTATION: DATE OF DISCHARGE:                                CONSULTATION   We were asked to see [name] in consultation by Dr. Patria Mane to evaluate him for an abscess in the left arm.  CHIEF COMPLAINT:  Fever.  HISTORY:  [name] is a 30 year old white male with cerebral palsy who presents to the emergency department with a tender draining area above his left elbow, has been associated with fevers.  He has problems communicating because of a cerebral palsy.  His mother said this area came up because is where his elbow rubbed on his wheelchair.  PAST MEDICAL HISTORY:  Significant for: 1. Cerebral palsy. 2. Constipation. 3. Hypertension. 4. DVT. 5. MRSA Infections.  PAST SURGICAL HISTORY:  Significant for small bowel resection in 2000.  MEDICATIONS:  Baclofen, calcium, Dulcolax, Flexeril, Vicodin, loperamide, Lopressor, losartan, and nitrofurantoin.  ALLERGIES: 1. BETADINE. 2. CIPRO. 3. IODINE. 4. MORPHINE.  SOCIAL HISTORY:  Denies use of tobacco or tobacco products.  FAMILY HISTORY:  Noncontributory.  PHYSICAL EXAMINATION:  VITAL SIGNS:  His temperature is 100.1, pulse of 148, blood pressure 122/64. GENERAL:  He is a white male with cerebral palsy who is bedridden. SKIN:  Warm and dry.  No jaundice. HEENT:  Eyes:  His external muscles are intact.  Pupils are equal, round and reactive to light.  Sclerae nonicteric. LUNGS:  Clear bilaterally with no use of accessory respiratory muscles. HEART:  Regular rate and rhythm with impulse in left chest. ABDOMEN:  Soft and nontender.  No palpable mass or hepatosplenomegaly. EXTREMITIES:  He has significant contractures.  Just above his left elbow on the ulnar side, he is got a large red, tender  draining area. PSYCHOLOGIC:  Psychologically, he is alert and appears oriented, although he has difficulty communicating.  On review of his lab work, significant for a white count of 11.1.  INR of 2.28.  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  He will need a Medicine admit, resuscitation, broad-spectrum antibiotic coverage to include coverage for methicillin- resistant Staphylococcus aureus and correction of his coags.  We will try to get all that done tonight and plan for incision and drainage probably tomorrow in the operating room.  I have discussed this with his mother who is his caregiver and she has understands and agrees with this treatment plan.     Ollen Gross. Vernell Morgans, M.D.     PST/MEDQ  D:  09/11/2010  T:  09/12/2010  Job:  [id number]  Electronically Signed by Chevis Pretty III M.D. on 09/25/2010 07:57:09 AM

## 2010-09-25 NOTE — Telephone Encounter (Signed)
Joyce Gross with [facility] called to report labs were not done Monday. It was interpreted to be weekly. Since he got them Thursday of last week, they planned on doing them this Thursday. They did not realize they were to be done Monday. she will send a nurse out tomorrow to get them

## 2010-09-26 NOTE — Discharge Summary (Signed)
NAMEODA, LANSDOWNE           ACCOUNT NO.:  [id number]  MEDICAL RECORD NO.:  [id number]  LOCATION:  5019                         FACILITY:  [facility]  PHYSICIAN:  Mauro Kaufmann, MD         DATE OF BIRTH:  [birth year]  DATE OF ADMISSION:  09/11/2010 DATE OF DISCHARGE:                              DISCHARGE SUMMARY   Discharge date to be determined.  ADMISSION DIAGNOSES: 1. Sepsis. 2. Left elbow abscess. 3. Sinus tachycardia. 4. History of deep venous thrombosis. 5. Possible pneumonia.  DISCHARGE DIAGNOSES: 1. Left elbow abscess, status post incision and discharge. 2. Methicillin-sensitive Staphylococcus aureus bacteremia. 3. Sinus tachycardia, probably due to the underlying systemic     inflammatory response syndrome, resolved. 4. History of venous thromboembolism, on chronic Coumadin therapy.     Venous Doppler of the left lower extremity negative for acute DVT     during this admission. 5. History of hypertension, currently controlled on metoprolol. 6. History of neurogenic bladder, chronically has condom catheter in     place. 7. History of cerebral palsy. 8. Bed bound as involuntary contractions of all the extremities. 9. Chronic right bundle branch block. 10.Leukocytosis, now resolved.  CONSULTS OBTAINED DURING HOSPICE: 1. Dr. Daiva Eves from Infectious Disease. 2. Dr. Emelia Loron from the [facility] Surgery.  PROCEDURE PERFORMED:  The patient underwent left upper extremity abscess, incision and drainage in 2012.  PERTINENT LABS: 1. Blood cultures done on September 11, 2010 showed positive for staph-     aureus which is again oxacillin sensitive. 2. Lactic acid of admission was 3.3. 3. The patient had tunnelled PICC line placement with ultrasound and     fluoroscopic guidance on September 21, 2010, in the left internal     jugular and also successful removal of the existing right tubular     approach, central venous catheter.  PERTINENT LABS:  As of August  21, WBC 4.8, hemoglobin 12.9, hematocrit 32.5 degrees, platelets 228, INR is 1.56, C-reactive protein is 4.9.  BRIEF HISTORY AND PHYSICAL:  This is a 30 year old gentleman with cerebral palsy.  The patient is actually high school graduate from [place], the patient is a group home.  The patient was brought to the hospital for possible repeat DVT due to redness of the lower extremity. The patient was found to have a left elbow rather more swollen and tender.  At that time, the patient was diagnosed with sepsis, was started on antibiotics and Surgery was consulted for possible I and D.  BRIEF HOSPITAL COURSE: 1. Left elbow abscess, status post I and D.  The patient was brought     to the hospital for evaluation of left lower extremity swelling.     In the ED, he was found to have abscess of the left elbow region.     A bedside incision was drained and incision draining was attempted     in the ED, however, it was unsuccessful.  Hence, the patient was     admitted to the Hospitalist Service with consulting surgery.  The     patient was empirically started on broad-spectrum antibiotics of     vancomycin and Zosyn.  The patient underwent incision and  drainage     the next day.  Also blood cultures drawn this admission which     was subsequently positive for staph.  At this time, the patient is     doing having well in [facility].  It was cleared the     patient for discharge.  The patient will follow up with Dr. Lindie Spruce     in 2 weeks' time and they recommended dressing changes at the group     home. 2. MSSA bacteremia.  The patient was noted to have leukocytosis with     tachycardia.  Lactic acid was elevated.  Blood cultures was     positive for MSSA.  Antibiotics were changed to Ancef and ID     consultation was obtained.  The patient was supposed to go TEE, as     it was discussed with ID, the TEE was not required at this time as     it was not going to change any  treatment plans.  Dr. Daiva Eves agreed     to discontinue the TEE as per Cardiology recommendation and at this     time, the plan is to give total 4 weeks of antibiotics.  The     patient is currently getting Ancef and as the patient is going to     the group home.  ID is trying to see if the insurance if     complaining will approve the patient's Cubicin which is once daily     dosing and much easier to give 3 times a day dosing of Ancef.  Also     the patient had a central venous catheter in the right internal     jugular vein which was replaced on August 21 by Interventional     Radiology and a PICC line was placed on the left internal jugular     vein.  Once the patient's insurance will approval the antibiotics,     the patient will be discharged on the antibiotics depending upon     the patient approval. 3. History of  DVT.  The patient was started on heparin infusion     without bolus after incision and drainage, left lower extremity     Doppler was done to assess left thigh swelling which is negative     for acute DVT.  The patient had prior DVTs in the left leg and     perhaps his left leg is chronically swollen from consequences as well.  At this time, the patient has been started back on Coumadin.     His INR is still subtherapeutic.  Once the patient goes home, he     can be sent home on Lovenox and Coumadin for bridging therapy. 4. History of hypertension.  The patient's blood pressure is     controlled on metoprolol. 5. Sinus tachycardia.  Again, it was thought to be multifactorial     because of sepsis.  The sinus tachycardia has resolved.  At this     time, the patient's pulse is 74, blood pressure 118/79,     respirations 18, temperature is 97.4, and O2 sats 97% on room air. 6. History of cerebral palsy.  The patient is maintained on baclofen,     flexor, Valium.  Unfortunately, his bed bound and has involuntary     jerky movements of all his extremities as result of at the  request     of both family and the patient.  The patient has been on soft     restraints. 7. Neurogenic bladder with prior history of frequent UTIs.  The     patient is on nitrofurantoin chronic suppressive therapy,     chronically has condom catheter in place.  DISPOSITION:  As above.  The patient will be discharged once the disposition is clear whether the patient is going to group home or nursing home and which antibiotics will be approved  by the insurance company.  DISCHARGING MEDICATIONS:  GENERAL:  I will leave it to discharging physician at time of discharge.  On today's exam, the patient's chest is clear to auscultation bilaterally.  HEART:  S1 and S2.  Regular rate and rhythm. ABDOMEN: Soft, nontender, no organomegaly. EXTREMITIES:  Left elbow in dressing,.  No cyanosis, clubbing, or edema noted in the lower extremity.  DISCHARGING MEDICATIONS:  As per discharging physician.     Mauro Kaufmann, MD     GL/MEDQ  D:  09/18/2010  T:  09/18/2010  Job:  [id number]  Electronically Signed by Sibyl Parr Deanza Upperman  on 09/26/2010 07:53:21 AM

## 2010-10-03 NOTE — Discharge Summary (Signed)
  NAMERAYMON, SCHLARB           ACCOUNT NO.:  [id number]  MEDICAL RECORD NO.:  [id number]  LOCATION:                                 FACILITY:  PHYSICIAN:  Clydia Llano, MD       DATE OF BIRTH:  [birth year]  DATE OF ADMISSION: DATE OF DISCHARGE:                              DISCHARGE SUMMARY   ADDENDUM  This is an addendum to discharge summary dictated on August 21 by Dr. Mauro Kaufmann.  PRIMARY CARE PHYSICIAN:  Unassigned.  The patient used to go previously to Dr. Nicholos Johns.  INFECTIOUS DISEASE:  Acey Lav, MD.  DISCHARGE DIAGNOSES: 1. Methicillin-susceptible Staphylococcus aureus bacteremia. 2. Left elbow abscess. 3. Sinus tachycardia. 4. History of deep venous thrombosis. 5. Possible pneumonia. 6. Controlled hypertension. 7. Neurogenic bladder.  The patient has condom catheter. 8. Cerebral palsy. 9. Bed-bound secondary to involuntary contraction. 10.Chronic right bundle-branch block.  DISCHARGE MEDICATIONS: 1. Cubicin 430 mg IV q.24 hours, date is October 16, 2010. 2. Lovenox 120 mg subcu daily for two more days. 3. Coumadin 3 mg on Monday, Thursday, and Friday, and 4 mg on the rest     of the week. 4. Baclofen 20 mg p.o. four times daily. 5. Calcium carbonate chewable 500 mg three times a day. 6. Flexeril 10 mg three times a day. 7. Diazepam 2 mg p.o. b.i.d. 8. Folic acid 1 mg p.o. daily. 9. Glycerin rectal suppository, one suppository as needed for     constipation. 10.Hyzaar p.o. daily. 11.Loperamide 1 capsule three times daily as needed for constipation. 12.Metoprolol XL 25 mg p.o. daily. 13.Nitrofurantoin macrocrystal 50 mg p.o. daily. 14.Tab-A-Vite p.o. daily. 15.Vitamin D 1000 units p.o. b.i.d.  For the rest of the discharge summary, please refer to the discharge summary dictated yesterday on August 21 by Dr. Mauro Kaufmann.     Clydia Llano, MD     ME/MEDQ  D:  09/19/2010  T:  09/19/2010  Job:  [id number]  Electronically Signed by  Clydia Llano  on 10/03/2010 03:04:55 PM

## 2010-10-15 ENCOUNTER — Telehealth: Payer: Self-pay | Admitting: *Deleted

## 2010-10-16 ENCOUNTER — Encounter: Payer: Self-pay | Admitting: Infectious Disease

## 2010-10-16 ENCOUNTER — Ambulatory Visit (INDEPENDENT_AMBULATORY_CARE_PROVIDER_SITE_OTHER): Payer: 59 | Admitting: Infectious Disease

## 2010-10-16 DIAGNOSIS — L02414 Cutaneous abscess of left upper limb: Secondary | ICD-10-CM | POA: Insufficient documentation

## 2010-10-16 DIAGNOSIS — R7881 Bacteremia: Secondary | ICD-10-CM

## 2010-10-16 DIAGNOSIS — IMO0002 Reserved for concepts with insufficient information to code with codable children: Secondary | ICD-10-CM

## 2010-10-16 DIAGNOSIS — A4101 Sepsis due to Methicillin susceptible Staphylococcus aureus: Secondary | ICD-10-CM | POA: Insufficient documentation

## 2010-10-16 MED ORDER — CEPHALEXIN 500 MG PO CAPS: 500.0000 mg | ORAL_CAPSULE | Freq: Four times a day (QID) | ORAL | Status: AC

## 2010-10-17 ENCOUNTER — Encounter: Payer: 59 | Admitting: Infectious Disease

## 2010-10-17 ENCOUNTER — Telehealth: Payer: Self-pay | Admitting: *Deleted

## 2010-10-17 NOTE — Assessment & Plan Note (Signed)
See above discussion

## 2010-10-17 NOTE — Assessment & Plan Note (Signed)
This appears to be doing better. It is not completely healed however. I'm going to continue on some oral Keflex for the next 2 weeks have him follow up with me in one month. I would like him also to followup with Central canal and surgery performed the I&D.

## 2010-10-17 NOTE — Progress Notes (Signed)
Subjective:    Patient ID: [name], male    DOB: 09-01-1980, 30 y.o.   MRN: [id number]  HPI  [name] is a 30 year old Caucasian male with cerebral palsy who came to the emergency department with complaints  of pain in his left elbow and concern for deep venous thrombosis.  He was ultimately found to have an abscess in the soft tissue near his   elbow.  He also had blood culture done on admission.  The abscess was I and D'ed by Dr. Dwain Sarna and he debrided a 4 x 3 cm subcutaneous tissue   and cleaned out purulent material.  The patient's abscess from his upper arm has subsequently grown methicillin-sensitive Aureus as has one of   his 2 admission blood cultures.  He was placed on vancomycin on the 14th  and has been on it since then.  We have not seen blood cultures proven that he is cleared of infection.  He has in the interim had a central  line placed in his neck due to difficulty with venous access. I saw him as an inpt at [facility] and placed him on ancef 2g iv tid, and he did well. He ultimaely was dc on IV daptomycin 6mg /kg daily and has completed more than a month of therapy. He returns for followup today. He is doing very well though he still does have a wound in his left arm that is open. I spent greater than 45 minutes with him including greater than 50% of time in face to face counselling and in coordination of care Review of Systems  Constitutional: Negative for fever, chills, diaphoresis, activity change, appetite change, fatigue and unexpected weight change.  HENT: Negative for congestion, sore throat, rhinorrhea, sneezing, trouble swallowing and sinus pressure.   Eyes: Negative for photophobia and visual disturbance.  Respiratory: Negative for cough, chest tightness, shortness of breath, wheezing and stridor.   Cardiovascular: Negative for chest pain, palpitations and leg swelling.  Gastrointestinal: Negative for nausea, vomiting, abdominal pain, diarrhea, constipation, blood  in stool, abdominal distention and anal bleeding.  Genitourinary: Negative for dysuria, hematuria, flank pain and difficulty urinating.  Musculoskeletal: Positive for back pain and arthralgias. Negative for myalgias and joint swelling.  Skin: Positive for color change and wound. Negative for pallor and rash.  Neurological: Negative for dizziness, tremors, weakness and light-headedness.  Hematological: Negative for adenopathy. Does not bruise/bleed easily.  Psychiatric/Behavioral: Negative for behavioral problems, confusion, sleep disturbance, dysphoric mood, decreased concentration and agitation.       Objective:   Physical Exam  Constitutional: He is oriented to person, place, and time. He appears well-developed and well-nourished. No distress.  HENT:  Head: Atraumatic.  Mouth/Throat: Oropharynx is clear and moist. No oropharyngeal exudate.  Eyes: Conjunctivae and EOM are normal. Pupils are equal, round, and reactive to light. No scleral icterus.  Neck: Normal range of motion. Neck supple. No JVD present.  Cardiovascular: Normal rate, regular rhythm and normal heart sounds.  Exam reveals no gallop and no friction rub.   No murmur heard. Pulmonary/Chest: Effort normal and breath sounds normal. No respiratory distress. He has no wheezes. He has no rales. He exhibits no tenderness.  Abdominal: He exhibits no distension and no mass. There is no tenderness. There is no rebound and no guarding.  Musculoskeletal: He exhibits no edema and no tenderness.       Right forearm: He exhibits tenderness.       Arms:      Area is open with  some beefy tissue extruding from wound, minimal discharge  Lymphadenopathy:    He has no cervical adenopathy.  Neurological: He is alert and oriented to person, place, and time. He displays abnormal reflex. He exhibits abnormal muscle tone. Coordination abnormal.       Contractures, spasticiity, patient has difficulty with vocalizing responsed. Needed help of his  therapist who cam with him  Skin: Skin is warm and dry. He is not diaphoretic. There is erythema. No pallor.  Psychiatric: He has a normal mood and affect. His behavior is normal. Judgment and thought content normal.          Assessment & Plan:  Abscess of arm, left This appears to be doing better. It is not completely healed however. I'm going to continue on some oral Keflex for the next 2 weeks have him follow up with me in one month. I would like him also to followup with Central canal and surgery performed the I&D.  MSSA (methicillin susceptible Staphylococcus aureus) septicemia He has completed greater than 4 weeks of therapy for his bacteremia with his Port-A-Cath can be re moved  Bacteremia See above discussion

## 2010-10-17 NOTE — Assessment & Plan Note (Signed)
He has completed greater than 4 weeks of therapy for his bacteremia with his Port-A-Cath can be re moved

## 2010-10-17 NOTE — Telephone Encounter (Signed)
Kay with [facility] does not want to pull the central line in the home. Feels it should be done in a more controlled setting. Wants a call with order [phone] x [phone]

## 2010-10-17 NOTE — Telephone Encounter (Signed)
She will need IR to have it removed. Spoke with [facility]. We need to ask radiology at [facility] to do this

## 2010-10-18 ENCOUNTER — Telehealth: Payer: Self-pay | Admitting: *Deleted

## 2010-10-18 NOTE — Telephone Encounter (Signed)
Gave verbal order to [facility] Nurse Supervisor Kay at [phone] Ext [phone] for Heparin flushes until patient can be set up with Interventional Radiology for removal of his porta cath. Wendall Mola CMA

## 2010-10-18 NOTE — Telephone Encounter (Signed)
I spoke with IR. Pt has appt tomorrow at 10am. Pt/caregiver has been notified. I called & lm for [name] with [facility] about his appt so they can stop going out after it is pulled.

## 2010-10-19 ENCOUNTER — Ambulatory Visit (HOSPITAL_COMMUNITY)
Admission: RE | Admit: 2010-10-19 | Discharge: 2010-10-19 | Disposition: A | Discharge status: Home / Self Care | Payer: 59 | Source: Ambulatory Visit | Attending: Infectious Disease | Admitting: Infectious Disease

## 2010-10-19 DIAGNOSIS — R7881 Bacteremia: Secondary | ICD-10-CM

## 2010-10-21 NOTE — Consult Note (Signed)
NAMEGLENROY, [name] NO.:  [id number]  MEDICAL RECORD NO.:  [id number]  LOCATION:                                 FACILITY:  PHYSICIAN:  Acey Lav, MD  DATE OF BIRTH:  [birth year]  DATE OF CONSULTATION: DATE OF DISCHARGE:                                CONSULTATION   REQUESTING PHYSICIAN:  Jeoffrey Massed, MD, with [facility].  REASON FOR INFECTIOUS DISEASE CONSULTATION:  The patient with methicillin-sensitive Staph aureus bacteremia and abscess.  HISTORY OF PRESENT ILLNESS:  [name] is a 30 year old Caucasian male with cerebral palsy who came to the emergency department with complaints of pain in his left elbow and concern for deep venous thrombosis.  He was ultimately found to have an abscess in the soft tissue near his elbow.  He also had blood culture done on admission.  The abscess was I and D'ed by Dr. Dwain Sarna and he debrided a 4 x 3 cm subcutaneous tissue and cleaned out purulent material.  The patient's abscess from his upper arm has subsequently grown methicillin-sensitive Aureus as has one of his 2 admission blood cultures.  He was placed on vancomycin on the 14th and has been on it since then.  We have not seen blood cultures proven that he is cleared of infection.  He has in the interim had a central line placed in his neck due to difficulty with venous access.  We have been asked to assist in management and workup of this patient with methicillin-sensitive Staph aureus bacteremia and a soft tissue abscess.  PAST MEDICAL HISTORY: 1. Cerebral palsy. 2. DVT. 3. Diarrhea.  ALLERGIES: 1. BETADINE. 2. CIPROFLOXACIN. 3. IODINE. 4. HIGH-DOSE NARCOTICS.  SOCIAL HISTORY:  The patient lives in [place] with a group home with 22 children with physical impairment.  FAMILY HISTORY:  Mother has a rare vulvar cancer.  REVIEW OF SYSTEMS:  Pertinent for fevers, chills, or weakness.  CURRENT MEDICATIONS:  Baclofen, vitamin D,  diazepam, folic acid, heparin, multivitamins, Macrobid, vancomycin, Dilaudid, and Vicodin.  PHYSICAL EXAMINATION:  VITAL SIGNS:  Temperature maximum last 24 hours is 99.1, temperature current 97.8, blood pressure 114/64, pulse 82, respirations 12, and pulse ox 97% on room air. GENERAL:  A pleasant gentleman.  His arms are restrained above his head. HEENT:  His pupils are equal, round, and reactive to light.  His oropharynx is without exudate. NECK:  Supple. CARDIOVASCULAR:  Regular rate and rhythm.  No murmurs, gallops, or rubs heard. LUNGS:  Clear to auscultation. ABDOMEN:  Soft and nondistended. EXTREMITIES:  Left upper arm is wrapped in dressing.  He has contractures throughout.  LABORATORY DATA:  Chest x-ray on the 16th, central catheter in good position.  CBC with differential:  White count 5.9, hemoglobin 11.6, and platelets 170.  Vancomycin trough 14.5.  Lactic acid was 1.2 yesterday.  Metabolic panel:  Today, sodium 79, potassium 4, chloride 106, bicarb 27, BUN and creatinine 4 and 1.68.  MRSA/PCR screen was negative.  Lactic acid on admission was elevated at 3.3.  MICROBIOLOGICAL DATA: 1. Abscess culture on the 15th, left arm, moderate Staphylococcus     aureus.  Anaerobic culture on the 15th, no growth. 2. Blood culture  on the 14th grew methicillin-sensitive Staph aureus,     sensitive to clindamycin, resistant to erythromycin, sensitive to     gentamicin, intermediate levofloxacin, sensitive to oxacillin,     resistant to penicillin, sensitive to rifampin, Bactrim,     vancomycin, tetracycline, intermediate moxifloxacin.  Blood culture     on the 14th from left arm, no growth yet.  IMPRESSION/RECOMMENDATION:  This is a 30 year old gentleman with cerebral palsy who presents with methicillin-resistant Staphylococcus aureus, arm abscess, and bacteremia.  Methicillin-resistant Staphylococcus aureus, bacteremia, and abscess: This gentleman has a community-acquired  Staph aureus bacteremia with abscess and in my book therefore meets the definition of a complicated Staphylococcus aureus bacteremia.  I would continue him on antibiotics, but change him to cefazolin 2 g IV q.8 h.  I will recheck blood cultures today to make sure he is clearing blood cultures.  Given that the central line was placed prior to proven sterilization of his blood cultures, he will likely need a new central line for long-term antibiotics.  He also needs a transesophageal echocardiogram to exclude endocarditis.  I would treat him aggressively and likely give this patient a month of antibiotics with cefazolin regardless of whether or not he has endovascular lesions on his TEE.  Obviously, the I and D of his abscess was critical and we should make sure that he does not have any evidence of metastatic spread of infection.  Screening, I will screen him for HIV, although his risk is low.  Thank you for this infectious disease consultation.     Acey Lav, MD     CV/MEDQ  D:  09/14/2010  T:  09/14/2010  Job:  [id number]  cc:   Juanetta Gosling, MD  Electronically Signed by Paulette Blanch DAM MD on 10/21/2010 09:57:25 PM

## 2010-10-30 ENCOUNTER — Encounter: Payer: Self-pay | Admitting: Infectious Disease

## 2010-11-01 ENCOUNTER — Encounter (INDEPENDENT_AMBULATORY_CARE_PROVIDER_SITE_OTHER): Payer: Self-pay | Admitting: Surgery

## 2010-11-01 ENCOUNTER — Ambulatory Visit (INDEPENDENT_AMBULATORY_CARE_PROVIDER_SITE_OTHER): Payer: 59 | Admitting: Surgery

## 2010-11-01 VITALS — BP 104/82 | HR 80 | Temp 98.9°F | Resp 16 | Wt 159.0 lb

## 2010-11-01 DIAGNOSIS — IMO0002 Reserved for concepts with insufficient information to code with codable children: Secondary | ICD-10-CM

## 2010-11-01 DIAGNOSIS — L02414 Cutaneous abscess of left upper limb: Secondary | ICD-10-CM

## 2010-11-01 NOTE — Progress Notes (Signed)
Chief complaint: Possible recurrent left elbow abscess  History of present illness: This patient has had recurrent infections and had an abscess drained just above the left elbow area he was asked to come back to see [place] to see if there was any further drainage that needed to be done at this point in time.  Past history family history and review of systems are all noted in it began not redictated into this note but have been reviewed.  Exam: Gen.: The patient is generally healthy-appearing but does have obvious CP Arm: There is a healing area of the left arm just above the elbow. There is no abscess present. There is some superficial purulence noted. There is also granulations present which I think is preventing this from healing over completely.  Impression: Resolving infection left elbow status post drainage with development of granulation tissue.  Plan: I treated this with some silver nitrate today. He may need a recurrent treatment in a week or 10 days. Otherwise she continue his local wound care as he has been doing. We'll make no changes in his medications.

## 2010-11-01 NOTE — Patient Instructions (Signed)
You should continue the local wound care. There is not an abscess present. There was some granulation tissue which I treated with some silver nitrate and another application may be needed in a week or two.

## 2010-11-10 NOTE — H&P (Signed)
[name], [name]           ACCOUNT NO.:  [id number]  MEDICAL RECORD NO.:  [id number]  LOCATION:  2917                         FACILITY:  [facility]  PHYSICIAN:  Lonia Blood, M.D.      DATE OF BIRTH:  12/14/1980  DATE OF ADMISSION:  09/11/2010 DATE OF DISCHARGE:                             HISTORY & PHYSICAL   PRIMARY CARE PHYSICIAN:  Previously Dr. Nicholos Johns.  Currently, his doctor is not local.  PRESENTING COMPLAINT:  Pain in his left elbow versus DVT.  HISTORY OF PRESENT ILLNESS:  The patient is a 30 year old gentleman with cerebral palsy who however has an unusual presentation with no mental exudation.  The patient is actually graduate of [employer] here in [place].  He is in the group home.  He had DVT in the past and has been on Coumadin.  He was brought in today with complaint of possible repeat DVT due to redness of the lower extremity.  On arrival in the ED, the patient was found to have his left elbow rather more swollen and tender.  It looks like he has an abscess there.  An attempt to do I and D in the ED failed because the patient could not tolerate it despite four-point restraints.  Surgery has therefore been consulted to do an I and D under general anesthesia.  The patient is able to communicate his mother who is at bedside helping with communication.  He had fever and chills and has some pain which he rated 8/10.  He has an indwelling catheter or condom cath with drainage, but denied any dysuria.  The patient has been taking his Coumadin effectively.  He is wheelchair bound completely and apparently uses his head to control his movements, although he is not paraplegic, has only advanced cerebral palsy.  PAST MEDICAL HISTORY: 1. Cerebral palsy. 2. History of DVT. 3. Diarrhea. 4. Impaired mobility, confined to wheelchair.  ALLERGIES: 1. BETADINE. 2. CIPRO. 3. IODINE. 4. HIGH DOSE OPIATES.  MEDICATIONS:  Baclofen, calcium carbonate, Dulcolax,  Fleet enema, Flexeril, glycerin, hydrocodone and acetaminophen, loperamide, Lopressor, losartan, multivitamins, nitrofurantoin, Tylenol, Valium, vitamin D3, and Coumadin.  SOCIAL HISTORY:  The patient lives in [place] in a group home with 22 children who have physical impairment, but no cerebral impairment.  He is wheelchair bound, able to control his movements.  FAMILY HISTORY:  Mother had some rare form of vulvar cancer.  Not sure of his father's condition.  REVIEW OF SYSTEMS:  Mainly fever, chills, and weakness, otherwise all systems are reviewed and are negative except per HPI.  PHYSICAL EXAMINATION:  VITAL SIGNS:  He had temperature of 100.1 orally, blood pressure 122/64 with pulse 148, respiratory rate 22, and saturations 96% on room air. GENERAL:  The patient is lying down in bed, awake, alert, communicating although he has speech impairment.  He has four-point restraints but responds to all questions appropriately. HEENT:  PERRL.  EOMI.  He has sunken eyes with poor dentition, mainly edentulous. NECK:  Supple.  No visible JVD.  No lymphadenopathy. RESPIRATORY:  He has good air entry bilaterally.  No wheezes, rhonchi, or rales. CARDIOVASCULAR:  He has S1 and S2.  No audible murmurs. ABDOMEN:  Soft, full,  and nontender with positive bowel sounds. EXTREMITIES:  No edema, cyanosis, or clubbing. SKIN:  No rashes or ulcers.  Multiple areas of the erythema in the left lower extremity towards the calf.  More significantly the left elbow is swollen, warm, tender, fluctuant with what appears to be a point draining some white pus. 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  LABORATORY DATA:  White count is 11.1, hemoglobin 14.0 with platelet 174, left shift, ANC of 8.9.  Sodium 141, potassium 4.0, chloride 101, CO2 of 29, glucose 107, BUN 7, and creatinine 0.80.  His lactic acid level is 3.3.  PT 25.5 and INR 2.28.   Chest x-ray showed increased right perihilar opacity probably from localized atelectasis or pneumonia. There is indistinct right heart border pectus excavatum, dextroconvex upper thoracic scoliosis, and low-lung volumes.  His EKG shows sinus tachycardia with right bundle-branch block which is not new.  ASSESSMENT:  This is a 30 year old gentleman with cerebral palsy from a group home who apparently had a prior history of methicillin-resistant Staphylococcus aureus infection presenting with a left elbow abscess/sepsis, possible pneumonia, sinus tachycardia, and right bundle- branch block as well as history of methicillin-resistant Staphylococcus aureus infection.  PLAN: 1. Sepsis.  The underlying cause is probably his abscess.  It is     worrisome for MRSA since he had prior history of MRSA.  His lactic     acid level is elevated.  His vitals, however, are stable except for     sinus tachycardia.  Since the patient has SIRS plus evidence of     infection, this confirms sepsis.  He will stay in the [facility]     [facility].  IV vancomycin and Zosyn empirically while waiting for blood     cultures and wound culture.  Hydrate the patient and keep him     stable. 2. Left elbow abscess.  He will have I and D under general anesthesia     later today.  Continue with antibiotics and wound care. 3. Sinus tachycardia.  Probably from sepsis.  Again, follow him     closely, hydrate him aggressively, and hopefully this would slowed     down his heart rate. 4. History of DVT, on Coumadin.  Since the patient is going for     general anesthesia, we will stop the Coumadin     and also try to reverse his INR by using FFP.  We will not use any     vitamin K. 5. Possible pneumonia.  Again, he will continue with antibiotics as     described above. 6. Abnormal EKG.  This is chronic as indicated and has not changed     since February of this year.     Lonia Blood, M.D.     Verlin Grills  D:  09/12/2010  T:   09/12/2010  Job:  [id number]  Electronically Signed by Lonia Blood M.D. on 11/10/2010 02:55:47 PM

## 2010-11-15 ENCOUNTER — Ambulatory Visit (INDEPENDENT_AMBULATORY_CARE_PROVIDER_SITE_OTHER): Payer: 59 | Admitting: Infectious Disease

## 2010-11-15 ENCOUNTER — Encounter: Payer: Self-pay | Admitting: Infectious Disease

## 2010-11-15 DIAGNOSIS — R7881 Bacteremia: Secondary | ICD-10-CM

## 2010-11-15 DIAGNOSIS — IMO0002 Reserved for concepts with insufficient information to code with codable children: Secondary | ICD-10-CM

## 2010-11-15 DIAGNOSIS — R195 Other fecal abnormalities: Secondary | ICD-10-CM

## 2010-11-15 DIAGNOSIS — L02414 Cutaneous abscess of left upper limb: Secondary | ICD-10-CM

## 2010-11-15 DIAGNOSIS — A4101 Sepsis due to Methicillin susceptible Staphylococcus aureus: Secondary | ICD-10-CM

## 2010-11-15 NOTE — Progress Notes (Signed)
Subjective:    Patient ID: [name], male    DOB: 1980/05/15, 30 y.o.   MRN: [id number]  HPI Ms. Newby is a 30 year old Caucasian male with cerebral palsy who came to the emergency department with complaints of pain in his left elbow and concern for deep venous thrombosis. He was ultimately found to have an abscess in the soft tissue near his  elbow. He also had blood culture done on admission. The abscess was I and D'ed by Dr. Dwain Sarna and he debrided a 4 x 3 cm subcutaneous tissue  and cleaned out purulent material. The patient's abscess from his upper arm has subsequently grown methicillin-sensitive Aureus as has one of  his 2 admission blood cultures. He was placed on vancomycin on the 14th and has been on it since then. We have not seen blood cultures proven that he is cleared of infection. He has in the interim had a central line placed in his neck due to difficulty with venous access. I saw him as an inpt at [facility] and placed him on ancef 2g iv tid, and he did well. He ultimaely was dc on IV daptomycin 6mg /kg daily and has completed more than a month of therapy. He returns for followup in September and we dc the PICC line and put him no oral keflex that he has since stopped. He had problems with loose stools on the keflex but only one bowel movement per day. His wound has improved adn he has been seen by [facility]. He continues to have wound care and is doing well.    Review of Systems  Constitutional: Negative for fever, chills, diaphoresis, activity change, appetite change, fatigue and unexpected weight change.  HENT: Negative for congestion, sore throat, rhinorrhea, sneezing, trouble swallowing and sinus pressure.   Eyes: Negative for photophobia and visual disturbance.  Respiratory: Negative for cough, chest tightness, shortness of breath, wheezing and stridor.   Cardiovascular: Negative for chest pain, palpitations and leg swelling.  Gastrointestinal: Positive for diarrhea. Negative  for nausea, vomiting, abdominal pain, constipation, blood in stool, abdominal distention and anal bleeding.  Genitourinary: Negative for hematuria and flank pain.  Musculoskeletal: Negative for myalgias, back pain, joint swelling, arthralgias and gait problem.  Skin: Negative for color change, pallor, rash and wound.  Neurological: Positive for tremors and speech difficulty. Negative for dizziness, weakness and light-headedness.  Hematological: Negative for adenopathy. Does not bruise/bleed easily.  Psychiatric/Behavioral: Negative for behavioral problems, confusion, sleep disturbance, dysphoric mood, decreased concentration and agitation.       Objective:   Physical Exam  Constitutional: He is oriented to person, place, and time. He appears well-developed and well-nourished. No distress.  HENT:  Head: Normocephalic and atraumatic.  Mouth/Throat: Oropharynx is clear and moist. No oropharyngeal exudate.  Eyes: Conjunctivae and EOM are normal. Pupils are equal, round, and reactive to light. No scleral icterus.  Neck: Normal range of motion. Neck supple. No JVD present.  Cardiovascular: Normal rate, regular rhythm and normal heart sounds.  Exam reveals no gallop and no friction rub.   No murmur heard. Pulmonary/Chest: Effort normal and breath sounds normal. No respiratory distress. He has no wheezes. He has no rales. He exhibits no tenderness.  Abdominal: He exhibits no distension and no mass. There is no tenderness. There is no rebound and no guarding.  Musculoskeletal: He exhibits no edema and no tenderness.       Arms: Lymphadenopathy:    He has no cervical adenopathy.  Neurological: He is alert and oriented to  person, place, and time. He has normal reflexes. He displays tremor. He exhibits abnormal muscle tone. Coordination abnormal.       Contractures, speech difficult to understand without his assitant  Skin: Skin is warm and dry. He is not diaphoretic. No erythema. No pallor.    Psychiatric: He has a normal mood and affect. His behavior is normal. Judgment and thought content normal.          Assessment & Plan:

## 2010-11-15 NOTE — Assessment & Plan Note (Signed)
Likely due to his cephalexin. With only one bowel movement per day this is not likely also be C. difficile colitis. Continue to observe.

## 2010-11-15 NOTE — Assessment & Plan Note (Signed)
Status post great than a month of antibiotics intravenously

## 2010-11-15 NOTE — Assessment & Plan Note (Signed)
Again status post greater than a month of intravenous antibiotics followed by oral antibiotics for his arm infection.

## 2010-11-15 NOTE — Assessment & Plan Note (Signed)
Abscess sp I and D and aggressive antibiotics. Now area is healing with largely scar tissue,. Will observe off of antibiotics, continue wound care

## 2010-11-19 NOTE — Progress Notes (Signed)
  Subjective:    Patient ID: [name], male    DOB: Sep 09, 1980, 30 y.o.   MRN: [id number]  HPI   Review of Systems     Objective:   Physical Exam        Assessment & Plan:

## 2010-11-30 ENCOUNTER — Ambulatory Visit: Payer: 59 | Attending: Family Medicine | Admitting: Physical Therapy

## 2010-11-30 DIAGNOSIS — IMO0001 Reserved for inherently not codable concepts without codable children: Secondary | ICD-10-CM | POA: Insufficient documentation

## 2010-11-30 DIAGNOSIS — R293 Abnormal posture: Secondary | ICD-10-CM | POA: Insufficient documentation

## 2010-12-01 ENCOUNTER — Emergency Department (HOSPITAL_COMMUNITY): Payer: 59

## 2010-12-01 ENCOUNTER — Emergency Department (HOSPITAL_COMMUNITY)
Admission: EM | Admit: 2010-12-01 | Discharge: 2010-12-01 | Disposition: A | Discharge status: Home / Self Care | Payer: 59 | Attending: Emergency Medicine | Admitting: Emergency Medicine

## 2010-12-01 DIAGNOSIS — L03119 Cellulitis of unspecified part of limb: Secondary | ICD-10-CM | POA: Insufficient documentation

## 2010-12-01 DIAGNOSIS — M79609 Pain in unspecified limb: Secondary | ICD-10-CM | POA: Insufficient documentation

## 2010-12-01 DIAGNOSIS — M25473 Effusion, unspecified ankle: Secondary | ICD-10-CM | POA: Insufficient documentation

## 2010-12-01 DIAGNOSIS — G809 Cerebral palsy, unspecified: Secondary | ICD-10-CM | POA: Insufficient documentation

## 2010-12-01 DIAGNOSIS — L02419 Cutaneous abscess of limb, unspecified: Secondary | ICD-10-CM | POA: Insufficient documentation

## 2010-12-01 DIAGNOSIS — M25476 Effusion, unspecified foot: Secondary | ICD-10-CM | POA: Insufficient documentation

## 2010-12-01 DIAGNOSIS — Z86718 Personal history of other venous thrombosis and embolism: Secondary | ICD-10-CM | POA: Insufficient documentation

## 2010-12-01 DIAGNOSIS — Z79899 Other long term (current) drug therapy: Secondary | ICD-10-CM | POA: Insufficient documentation

## 2010-12-01 DIAGNOSIS — M7989 Other specified soft tissue disorders: Secondary | ICD-10-CM | POA: Insufficient documentation

## 2010-12-01 DIAGNOSIS — Z7901 Long term (current) use of anticoagulants: Secondary | ICD-10-CM | POA: Insufficient documentation

## 2010-12-01 LAB — CBC
MCH: 30.3 pg (ref 26.0–34.0)
MCHC: 35.2 g/dL (ref 30.0–36.0)
MCV: 86.1 fL (ref 78.0–100.0)
Platelets: 188 10*3/uL (ref 150–400)
RDW: 13.3 % (ref 11.5–15.5)
WBC: 6.7 10*3/uL (ref 4.0–10.5)

## 2010-12-01 LAB — DIFFERENTIAL
Eosinophils Absolute: 0.2 10*3/uL (ref 0.0–0.7)
Eosinophils Relative: 3 % (ref 0–5)
Lymphs Abs: 1.7 10*3/uL (ref 0.7–4.0)
Monocytes Absolute: 0.6 10*3/uL (ref 0.1–1.0)

## 2010-12-01 LAB — BASIC METABOLIC PANEL
BUN: 12 mg/dL (ref 6–23)
Calcium: 9.5 mg/dL (ref 8.4–10.5)
Creatinine, Ser: 0.89 mg/dL (ref 0.50–1.35)
GFR calc non Af Amer: >90 mL/min (ref >90)
Glucose, Bld: 113 mg/dL — ABNORMAL HIGH (ref 70–99)

## 2010-12-17 NOTE — Telephone Encounter (Signed)
Last cubicin dose 10/16/10

## 2011-02-16 ENCOUNTER — Encounter (HOSPITAL_COMMUNITY): Payer: Self-pay

## 2011-02-16 ENCOUNTER — Emergency Department (HOSPITAL_COMMUNITY): Payer: 59

## 2011-02-16 ENCOUNTER — Emergency Department (HOSPITAL_COMMUNITY)
Admission: EM | Admit: 2011-02-16 | Discharge: 2011-02-16 | Disposition: A | Discharge status: Home / Self Care | Payer: 59 | Attending: Emergency Medicine | Admitting: Emergency Medicine

## 2011-02-16 DIAGNOSIS — I10 Essential (primary) hypertension: Secondary | ICD-10-CM | POA: Insufficient documentation

## 2011-02-16 DIAGNOSIS — Q6689 Other  specified congenital deformities of feet: Secondary | ICD-10-CM | POA: Insufficient documentation

## 2011-02-16 DIAGNOSIS — M25579 Pain in unspecified ankle and joints of unspecified foot: Secondary | ICD-10-CM | POA: Insufficient documentation

## 2011-02-16 DIAGNOSIS — M7989 Other specified soft tissue disorders: Secondary | ICD-10-CM | POA: Insufficient documentation

## 2011-02-16 DIAGNOSIS — Z86718 Personal history of other venous thrombosis and embolism: Secondary | ICD-10-CM | POA: Insufficient documentation

## 2011-02-16 DIAGNOSIS — Z7901 Long term (current) use of anticoagulants: Secondary | ICD-10-CM | POA: Insufficient documentation

## 2011-02-16 DIAGNOSIS — M79609 Pain in unspecified limb: Secondary | ICD-10-CM

## 2011-02-16 DIAGNOSIS — G809 Cerebral palsy, unspecified: Secondary | ICD-10-CM | POA: Insufficient documentation

## 2011-02-16 LAB — BASIC METABOLIC PANEL
BUN: 14 mg/dL (ref 6–23)
CO2: 28 mEq/L (ref 19–32)
Calcium: 9.1 mg/dL (ref 8.4–10.5)
Chloride: 101 mEq/L (ref 96–112)
Creatinine, Ser: 0.82 mg/dL (ref 0.50–1.35)
GFR calc Af Amer: >90 mL/min (ref >90)

## 2011-02-16 LAB — CBC
HCT: 41.1 % (ref 39.0–52.0)
Hemoglobin: 14.2 g/dL (ref 13.0–17.0)
MCHC: 34.5 g/dL (ref 30.0–36.0)
MCV: 87.8 fL (ref 78.0–100.0)
RDW: 13.6 % (ref 11.5–15.5)

## 2011-02-16 LAB — DIFFERENTIAL
Basophils Absolute: 0 10*3/uL (ref 0.0–0.1)
Basophils Relative: 0 % (ref 0–1)
Eosinophils Relative: 3 % (ref 0–5)
Monocytes Absolute: 0.5 10*3/uL (ref 0.1–1.0)
Monocytes Relative: 9 % (ref 3–12)
Neutro Abs: 2.7 10*3/uL (ref 1.7–7.7)

## 2011-02-16 MED ORDER — DOXYCYCLINE HYCLATE 100 MG PO CAPS: 100.0000 mg | ORAL_CAPSULE | Freq: Two times a day (BID) | ORAL | Status: AC

## 2011-02-16 NOTE — ED Notes (Signed)
Patient given discharge instructions, information, prescriptions, and diet order. Patient states that they adequately understand discharge information given and to return to ED if symptoms return or worsen.     

## 2011-02-16 NOTE — Progress Notes (Signed)
*  PRELIMINARY RESULTS*  Right lower extremity venous Doppler completed.  No obvious evidence of DVT or SVT in the right lower extremity.    Sherren Kerns Renee 02/16/2011, 1:03 PM

## 2011-02-16 NOTE — ED Notes (Signed)
Patient transported to X-ray 

## 2011-02-16 NOTE — ED Notes (Signed)
RUE:[id number]<JW> Expected date:02/16/11<BR> Expected time:11:05 AM<BR> Means of arrival:Ambulance<BR> Comments:<BR> EMS ?DVT

## 2011-02-16 NOTE — ED Notes (Signed)
Ultrasound at bedside

## 2011-02-16 NOTE — ED Notes (Signed)
[facility] with c/o warmth and redness to right lower leg, pt has hx of DVT;s to right leg. Pt also has hx of cerebral palsy and is difficult to understand.

## 2011-02-16 NOTE — ED Provider Notes (Signed)
Patient has a history of DVT and is on Coumadin. They bring him to the emergency department today for some swelling and redness around his right knee without known trauma. Patient's noted to have mild swelling around his right knee and redness of the skin but he also has redness of the skin of his left leg and around the knee and also both of his ankles. He has his right foot flexed medially which is chronic. Patient's speech is slow and hard to understand however he is awake and alert in no suspicious results. Patient is for intercurrent with him going home. Patient has had several very bad skin infections that have progressed to sepsis. He will be started empirically on antibiotics when he is discharged in the hospital.  Medical screening examination/treatment/procedure(s) were conducted as a shared visit with non-physician practitioner(s) and myself.  I personally evaluated the patient during the encounter Devoria Albe, MD, Franz Dell, MD 02/16/11 [phone]

## 2011-02-16 NOTE — ED Provider Notes (Signed)
History     CSN: [id number]  Arrival date & time 02/16/11  1143   First MD Initiated Contact with Patient 02/16/11 1145      Chief Complaint  Patient presents with  . DVT    rule out, has hx    (Consider location/radiation/quality/duration/timing/severity/associated sxs/prior treatment) Patient is a 31 y.o. male presenting with leg pain. The history is provided by the patient and a friend.  Leg Pain   Pt with history of CP, states woke up this morning and was being helpted get ready, noticed that his right leg and ankle were more swollen then usual and red. Pt denies any pain, denies any injuries. Denies fever, chills, malaise. States is concerned because has has hx of DVTs and infection. Denies any other complaints. Did not take any medications for this problem. States at baseline does not have a good sensation and no motor movement in bilateral legs.  Past Medical History  Diagnosis Date  . Hypertension   . Cerebral palsy   . Recurrent UTI   . Coagulopathy   . Insomnia   . Tachycardia   . Chronic constipation     Past Surgical History  Procedure Date  . Small intestine surgery   . Incise and drain abcess 09/11/10  . Elbow surgery 09/12/10    left    History reviewed. No pertinent family history.  History  Substance Use Topics  . Smoking status: Never Smoker   . Smokeless tobacco: Never Used  . Alcohol Use: No      Review of Systems  Constitutional: Negative for fever and chills.  HENT: Negative.   Eyes: Negative.   Respiratory: Negative.   Cardiovascular: Negative.   Gastrointestinal: Negative.   Genitourinary: Negative.   Musculoskeletal: Positive for joint swelling and arthralgias.  Skin: Positive for color change.  Neurological: Negative.   Psychiatric/Behavioral: Negative.     Allergies  Betadine; Ciprofloxacin; and Morphine  Home Medications   Current Outpatient Rx  Name Route Sig Dispense Refill  . ACETAMINOPHEN 325 MG PO TABS Oral Take  650 mg by mouth every 6 (six) hours as needed.      . ALENDRONATE SODIUM 70 MG PO TABS Oral Take 70 mg by mouth every 7 (seven) days. Take with a full glass of water on an empty stomach.     Marland Kitchen BACLOFEN 20 MG PO TABS Oral Take 20 mg by mouth 4 (four) times daily.      Marland Kitchen CETIRIZINE HCL 10 MG PO TABS Oral Take 10 mg by mouth daily.      . CYCLOBENZAPRINE HCL 10 MG PO TABS Oral Take 10 mg by mouth 3 (three) times daily as needed.      Marland Kitchen GABAPENTIN 800 MG PO TABS Oral Take 400 mg by mouth daily.      Marland Kitchen SANI-SUPP ADULT RE Rectal Place rectally as needed.      Marland Kitchen HYDROCODONE-ACETAMINOPHEN 5-325 MG PO TABS Oral Take 1 tablet by mouth every 4 (four) hours as needed.      Marland Kitchen LISINOPRIL 2.5 MG PO TABS Oral Take 2.5 mg by mouth daily.      Marland Kitchen LOPERAMIDE HCL 2 MG PO CAPS Oral Take 2 mg by mouth 3 (three) times daily as needed.      Marland Kitchen METOPROLOL SUCCINATE ER 100 MG PO TB24 Oral Take 100 mg by mouth daily.      Marland Kitchen PANTOPRAZOLE SODIUM 40 MG PO TBEC Oral Take 40 mg by mouth daily.      Marland Kitchen  DISPOSABLE ENEMA 19-7 GM/118ML RE ENEM Rectal Place 1 enema rectally once. follow package directions     . WARFARIN SODIUM 3 MG PO TABS Oral Take 3 mg by mouth. Monday, Thursday, and Friday     . WARFARIN SODIUM 4 MG PO TABS Oral Take 4 mg by mouth. Tuesday, Wednesday, Saturday, and Sunday       BP 105/55  Pulse 89  Temp(Src) 98.4 F (36.9 C) (Oral)  Resp 18  Ht 5\' 8"  (1.727 m)  Wt 150 lb (68.04 kg)  BMI 22.81 kg/m2  SpO2 95%  Physical Exam  Nursing note and vitals reviewed. Constitutional: He is oriented to person, place, and time. He appears well-developed and well-nourished.  Eyes: Conjunctivae are normal.  Neck: Neck supple.  Cardiovascular: Normal rate, regular rhythm and normal heart sounds.   Pulmonary/Chest: Effort normal and breath sounds normal. No respiratory distress.  Abdominal: Soft. Bowel sounds are normal. He exhibits no distension.  Musculoskeletal:       Bilateral club foot. Right ankle appears  swollen, and there is faint erythemous discoloration extending up to the upper calf. Foot and ankle as well as calf non tender to the pt. No ROM at the ankle joint. No pitting edema.  Neurological: He is alert and oriented to person, place, and time.       Hard to understand speech  Skin: Skin is warm and dry.  Psychiatric: He has a normal mood and affect.    ED Course  Procedures (including critical care time)   Labs Reviewed  CBC  DIFFERENTIAL  BASIC METABOLIC PANEL  PROTIME-INR   dopler negative for DVT. No signs of an injury, x-ray negative. No significant lab findings. VS normal. Pt has history of multiple admissions for cellulitis. WIll cover for possible early infection. Will d/c home with follow up. Results discussed with pt, agrees with the plan.   No diagnosis found.    MDM          Lottie Mussel, PA 02/16/11 2023

## 2011-02-17 NOTE — ED Provider Notes (Signed)
See prior note   Ward Givens, MD 02/17/11 [phone]

## 2011-06-03 ENCOUNTER — Encounter (HOSPITAL_COMMUNITY): Payer: Self-pay

## 2011-06-03 ENCOUNTER — Emergency Department (HOSPITAL_COMMUNITY): Payer: 59

## 2011-06-03 ENCOUNTER — Inpatient Hospital Stay (HOSPITAL_COMMUNITY)
Admission: EM | Admit: 2011-06-03 | Discharge: 2011-06-10 | DRG: 389 | Disposition: A | Discharge status: Skilled Nursing Facility | Payer: 59 | Attending: Internal Medicine | Admitting: Internal Medicine

## 2011-06-03 DIAGNOSIS — K56600 Partial intestinal obstruction, unspecified as to cause: Secondary | ICD-10-CM

## 2011-06-03 DIAGNOSIS — R1909 Other intra-abdominal and pelvic swelling, mass and lump: Secondary | ICD-10-CM

## 2011-06-03 DIAGNOSIS — N179 Acute kidney failure, unspecified: Secondary | ICD-10-CM

## 2011-06-03 DIAGNOSIS — K5900 Constipation, unspecified: Secondary | ICD-10-CM

## 2011-06-03 DIAGNOSIS — J029 Acute pharyngitis, unspecified: Secondary | ICD-10-CM

## 2011-06-03 DIAGNOSIS — D510 Vitamin B12 deficiency anemia due to intrinsic factor deficiency: Secondary | ICD-10-CM

## 2011-06-03 DIAGNOSIS — N200 Calculus of kidney: Secondary | ICD-10-CM | POA: Diagnosis present

## 2011-06-03 DIAGNOSIS — I10 Essential (primary) hypertension: Secondary | ICD-10-CM

## 2011-06-03 DIAGNOSIS — G809 Cerebral palsy, unspecified: Secondary | ICD-10-CM

## 2011-06-03 DIAGNOSIS — IMO0002 Reserved for concepts with insufficient information to code with codable children: Secondary | ICD-10-CM

## 2011-06-03 DIAGNOSIS — K56609 Unspecified intestinal obstruction, unspecified as to partial versus complete obstruction: Secondary | ICD-10-CM

## 2011-06-03 DIAGNOSIS — A4101 Sepsis due to Methicillin susceptible Staphylococcus aureus: Secondary | ICD-10-CM

## 2011-06-03 DIAGNOSIS — R1033 Periumbilical pain: Secondary | ICD-10-CM

## 2011-06-03 DIAGNOSIS — G808 Other cerebral palsy: Secondary | ICD-10-CM

## 2011-06-03 DIAGNOSIS — R7881 Bacteremia: Secondary | ICD-10-CM

## 2011-06-03 DIAGNOSIS — Q278 Other specified congenital malformations of peripheral vascular system: Secondary | ICD-10-CM

## 2011-06-03 DIAGNOSIS — N319 Neuromuscular dysfunction of bladder, unspecified: Secondary | ICD-10-CM | POA: Diagnosis present

## 2011-06-03 DIAGNOSIS — R7989 Other specified abnormal findings of blood chemistry: Secondary | ICD-10-CM

## 2011-06-03 DIAGNOSIS — K8020 Calculus of gallbladder without cholecystitis without obstruction: Secondary | ICD-10-CM | POA: Diagnosis present

## 2011-06-03 DIAGNOSIS — F79 Unspecified intellectual disabilities: Secondary | ICD-10-CM | POA: Diagnosis present

## 2011-06-03 DIAGNOSIS — E876 Hypokalemia: Secondary | ICD-10-CM

## 2011-06-03 DIAGNOSIS — R1900 Intra-abdominal and pelvic swelling, mass and lump, unspecified site: Secondary | ICD-10-CM | POA: Diagnosis present

## 2011-06-03 DIAGNOSIS — R195 Other fecal abnormalities: Secondary | ICD-10-CM

## 2011-06-03 DIAGNOSIS — I4891 Unspecified atrial fibrillation: Secondary | ICD-10-CM

## 2011-06-03 DIAGNOSIS — L02414 Cutaneous abscess of left upper limb: Secondary | ICD-10-CM

## 2011-06-03 DIAGNOSIS — Z7901 Long term (current) use of anticoagulants: Secondary | ICD-10-CM

## 2011-06-03 DIAGNOSIS — K5909 Other constipation: Secondary | ICD-10-CM

## 2011-06-03 LAB — DIFFERENTIAL
Basophils Relative: 0 % (ref 0–1)
Eosinophils Absolute: 0 10*3/uL (ref 0.0–0.7)
Eosinophils Relative: 0 % (ref 0–5)
Lymphs Abs: 0.2 10*3/uL — ABNORMAL LOW (ref 0.7–4.0)
Monocytes Absolute: 0.8 10*3/uL (ref 0.1–1.0)

## 2011-06-03 LAB — COMPREHENSIVE METABOLIC PANEL
ALT: 17 U/L (ref 0–53)
AST: 21 U/L (ref 0–37)
Albumin: 4.4 g/dL (ref 3.5–5.2)
Alkaline Phosphatase: 72 U/L (ref 39–117)
BUN: 30 mg/dL — ABNORMAL HIGH (ref 6–23)
Potassium: 4 mEq/L (ref 3.5–5.1)
Sodium: 138 mEq/L (ref 135–145)
Total Protein: 7.8 g/dL (ref 6.0–8.3)

## 2011-06-03 LAB — PROTIME-INR
INR: 2.2 — ABNORMAL HIGH (ref 0.00–1.49)
Prothrombin Time: 24.8 seconds — ABNORMAL HIGH (ref 11.6–15.2)

## 2011-06-03 LAB — URINALYSIS, ROUTINE W REFLEX MICROSCOPIC
Glucose, UA: NEGATIVE mg/dL
Hgb urine dipstick: NEGATIVE
Specific Gravity, Urine: 1.029 (ref 1.005–1.030)
pH: 6 (ref 5.0–8.0)

## 2011-06-03 LAB — CBC
Hemoglobin: 18.1 g/dL — ABNORMAL HIGH (ref 13.0–17.0)
MCH: 31.9 pg (ref 26.0–34.0)
MCHC: 36.6 g/dL — ABNORMAL HIGH (ref 30.0–36.0)
MCV: 87.3 fL (ref 78.0–100.0)
RBC: 5.67 MIL/uL (ref 4.22–5.81)

## 2011-06-03 LAB — OCCULT BLOOD, POC DEVICE: Fecal Occult Bld: NEGATIVE

## 2011-06-03 MED ORDER — PROMETHAZINE HCL 25 MG PO TABS
12.5000 mg | ORAL_TABLET | Freq: Four times a day (QID) | ORAL | Status: DC | PRN
  Administered 2011-06-03: 12.5 mg via ORAL
  Filled 2011-06-03: qty 1

## 2011-06-03 MED ORDER — SODIUM CHLORIDE 0.9 % IV BOLUS (SEPSIS)
2000.0000 mL | Freq: Once | INTRAVENOUS | Status: AC
  Administered 2011-06-03: 2000 mL via INTRAVENOUS

## 2011-06-03 MED ORDER — FLEET ENEMA 7-19 GM/118ML RE ENEM
1.0000 | ENEMA | Freq: Once | RECTAL | Status: AC
  Administered 2011-06-04: 1 via RECTAL

## 2011-06-03 MED ORDER — WARFARIN SODIUM 4 MG PO TABS
4.0000 mg | ORAL_TABLET | Freq: Once | ORAL | Status: AC
  Administered 2011-06-03: 4 mg via ORAL
  Filled 2011-06-03: qty 1

## 2011-06-03 MED ORDER — BACLOFEN 20 MG PO TABS
20.0000 mg | ORAL_TABLET | Freq: Four times a day (QID) | ORAL | Status: DC
  Administered 2011-06-03 – 2011-06-10 (×21): 20 mg via ORAL
  Filled 2011-06-03 (×32): qty 1

## 2011-06-03 MED ORDER — WARFARIN - PHARMACIST DOSING INPATIENT
Freq: Every day | Status: DC
  Administered 2011-06-03: 18:00:00

## 2011-06-03 MED ORDER — SODIUM CHLORIDE 0.9 % IV SOLN
INTRAVENOUS | Status: DC
  Administered 2011-06-03 – 2011-06-05 (×4): via INTRAVENOUS

## 2011-06-03 MED ORDER — FLEET ENEMA 7-19 GM/118ML RE ENEM
1.0000 | ENEMA | Freq: Once | RECTAL | Status: AC
  Administered 2011-06-03: 11:00:00 via RECTAL
  Filled 2011-06-03: qty 1

## 2011-06-03 MED ORDER — METOPROLOL SUCCINATE ER 100 MG PO TB24
100.0000 mg | ORAL_TABLET | Freq: Every day | ORAL | Status: DC
  Administered 2011-06-03 – 2011-06-04 (×2): 100 mg via ORAL
  Filled 2011-06-03 (×2): qty 1

## 2011-06-03 MED ORDER — FLEET ENEMA 7-19 GM/118ML RE ENEM: 1.0000 | ENEMA | Freq: Once | RECTAL | Status: AC | PRN

## 2011-06-03 MED ORDER — PROMETHAZINE HCL 25 MG/ML IJ SOLN
12.5000 mg | Freq: Four times a day (QID) | INTRAMUSCULAR | Status: DC | PRN
  Administered 2011-06-04 (×2): 12.5 mg via INTRAVENOUS
  Filled 2011-06-03 (×2): qty 1

## 2011-06-03 MED ORDER — DIAZEPAM 2 MG PO TABS
2.0000 mg | ORAL_TABLET | Freq: Two times a day (BID) | ORAL | Status: DC
  Administered 2011-06-03 – 2011-06-10 (×11): 2 mg via ORAL
  Filled 2011-06-03 (×12): qty 1

## 2011-06-03 MED ORDER — POLYETHYLENE GLYCOL 3350 17 G PO PACK
17.0000 g | PACK | Freq: Every day | ORAL | Status: DC
  Administered 2011-06-04 – 2011-06-08 (×2): 17 g via ORAL
  Filled 2011-06-03 (×8): qty 1

## 2011-06-03 MED ORDER — HYDROCHLOROTHIAZIDE 25 MG PO TABS
25.0000 mg | ORAL_TABLET | Freq: Every day | ORAL | Status: DC
  Administered 2011-06-03 – 2011-06-10 (×6): 25 mg via ORAL
  Filled 2011-06-03 (×8): qty 1

## 2011-06-03 MED ORDER — FLEET ENEMA 7-19 GM/118ML RE ENEM
1.0000 | ENEMA | Freq: Once | RECTAL | Status: DC
  Filled 2011-06-03: qty 1

## 2011-06-03 MED ORDER — HYDROMORPHONE HCL PF 1 MG/ML IJ SOLN
0.5000 mg | INTRAMUSCULAR | Status: DC | PRN
  Administered 2011-06-03 – 2011-06-07 (×8): 0.5 mg via INTRAVENOUS
  Filled 2011-06-03 (×9): qty 1

## 2011-06-03 MED ORDER — SODIUM CHLORIDE 0.9 % IV SOLN
INTRAVENOUS | Status: DC
  Administered 2011-06-03: 11:00:00 via INTRAVENOUS

## 2011-06-03 MED ORDER — FLEET ENEMA 7-19 GM/118ML RE ENEM: 1.0000 | ENEMA | Freq: Once | RECTAL | Status: DC

## 2011-06-03 MED ORDER — ONDANSETRON HCL 4 MG/2ML IJ SOLN
4.0000 mg | Freq: Four times a day (QID) | INTRAMUSCULAR | Status: DC | PRN
  Administered 2011-06-03 – 2011-06-04 (×3): 4 mg via INTRAVENOUS
  Filled 2011-06-03 (×2): qty 2

## 2011-06-03 MED ORDER — BISACODYL 10 MG RE SUPP
10.0000 mg | Freq: Two times a day (BID) | RECTAL | Status: DC
  Administered 2011-06-03 – 2011-06-08 (×5): 10 mg via RECTAL
  Filled 2011-06-03 (×7): qty 1

## 2011-06-03 MED ORDER — ONDANSETRON HCL 4 MG/2ML IJ SOLN
INTRAMUSCULAR | Status: AC
  Administered 2011-06-03: 4 mg via INTRAVENOUS
  Filled 2011-06-03: qty 2

## 2011-06-03 MED ORDER — ONDANSETRON HCL 4 MG/2ML IJ SOLN
4.0000 mg | Freq: Once | INTRAMUSCULAR | Status: AC
  Administered 2011-06-03: 4 mg via INTRAVENOUS
  Filled 2011-06-03: qty 2

## 2011-06-03 MED ORDER — WARFARIN SODIUM 2 MG PO TABS: 2.0000 mg | ORAL_TABLET | Freq: Every day | ORAL | Status: DC

## 2011-06-03 NOTE — Progress Notes (Signed)
[address], [place], [phone], option 4

## 2011-06-03 NOTE — ED Notes (Signed)
IV not running. IV team called.

## 2011-06-03 NOTE — ED Provider Notes (Signed)
History     CSN: [id number]  Arrival date & time 06/03/11  0902   First MD Initiated Contact with Patient 06/03/11 [phone]      Chief Complaint  Patient presents with  . Hematemesis  abdominal pain, vomiting  (Consider location/radiation/quality/duration/timing/severity/associated sxs/prior treatment) HPI This 31 year old male has a history of cerebral palsy with some intellectual disability who has chronic constipation as well as intermittent loose stools and now presents with several hours apparently of abdominal distention, diffuse abdominal pain, as well as vomiting with some small blood-tinged component of the emesis without vomiting gross blood or coffee ground emesis, the patient does not know the last 2 days had a bowel movement, there is no fever chest pain or shortness of breath. There is no treatment prior to arrival. He is on Coumadin for prior deep venous thrombosis. He has had small bowel obstructions in the past as well as ileus. Past Medical History  Diagnosis Date  . Hypertension   . Cerebral palsy   . Recurrent UTI   . Coagulopathy   . Insomnia   . Tachycardia   . Chronic constipation     Past Surgical History  Procedure Date  . Small intestine surgery   . Incise and drain abcess 09/11/10  . Elbow surgery 09/12/10    left    History reviewed. No pertinent family history.  History  Substance Use Topics  . Smoking status: Never Smoker   . Smokeless tobacco: Never Used  . Alcohol Use: No      Review of Systems  Unable to perform ROS: Dementia  Constitutional: Negative for fever.       10 Systems reviewed and are negative for acute change except as noted in the HPI.  HENT: Negative for congestion.   Eyes: Negative for discharge and redness.  Respiratory: Negative for cough and shortness of breath.   Cardiovascular: Negative for chest pain.  Gastrointestinal: Positive for nausea, vomiting, abdominal pain and constipation. Negative for diarrhea and blood  in stool.  Skin: Negative for rash.  Neurological: Negative for headaches.  Psychiatric/Behavioral:       No behavior change.    Allergies  Betadine; Ciprofloxacin; and Morphine  Home Medications   No current outpatient prescriptions on file.  BP 122/78  Pulse 112  Temp(Src) 97.9 F (36.6 C) (Axillary)  Resp 20  Ht 5\' 8"  (1.727 m)  Wt 160 lb (72.576 kg)  BMI 24.33 kg/m2  SpO2 94%  Physical Exam  Nursing note and vitals reviewed. Constitutional:       Awake, alert, nontoxic appearance.  HENT:  Head: Atraumatic.  Eyes: Right eye exhibits no discharge. Left eye exhibits no discharge.  Neck: Neck supple.  Cardiovascular: Regular rhythm.   No murmur heard.      Tachycardia  Pulmonary/Chest: Effort normal and breath sounds normal. No respiratory distress. He has no wheezes. He has no rales. He exhibits no tenderness.  Abdominal: Soft. Bowel sounds are normal. He exhibits distension. He exhibits no mass. There is tenderness. There is guarding. There is no rebound.       Mild diffuse tenderness without rebound  Genitourinary:       Brown hard stool with suspected fecal impaction but difficult to reach with examination glove to attempt manual disimpaction  Musculoskeletal: He exhibits no tenderness.       Baseline ROM, no obvious new focal weakness.  Neurological: He is alert.       Mental status and motor strength appears baseline for patient  and situation.  Skin: No rash noted.  Psychiatric: He has a normal mood and affect.    ED Course  Procedures (including critical care time) Patient began having large hard stool bowel movement in the emergency department after enema and starting to feel better with less pain and abdominal distention, patient and family refused NG tube at this time, the family highly suspicious that chronic constipation with fecal impaction has caused partial obstruction, [facility] paged.  [facility] will admit and consult [facility] prn.[id number] Labs Reviewed  CBC -  Abnormal; Notable for the following:    Hemoglobin 18.1 (*)    MCHC 36.6 (*)    All other components within normal limits  DIFFERENTIAL - Abnormal; Notable for the following:    Neutrophils Relative 79 (*)    Lymphocytes Relative 5 (*)    Monocytes Relative 16 (*)    Lymphs Abs 0.2 (*)    All other components within normal limits  URINALYSIS, ROUTINE W REFLEX MICROSCOPIC - Abnormal; Notable for the following:    Color, Urine AMBER (*) BIOCHEMICALS MAY BE AFFECTED BY COLOR   APPearance CLOUDY (*)    Bilirubin Urine SMALL (*)    Ketones, ur 40 (*)    All other components within normal limits  PROTIME-INR - Abnormal; Notable for the following:    Prothrombin Time 24.8 (*)    INR 2.20 (*)    All other components within normal limits  COMPREHENSIVE METABOLIC PANEL - Abnormal; Notable for the following:    Glucose, Bld 138 (*)    BUN 30 (*)    Creatinine, Ser 1.44 (*)    Total Bilirubin 1.3 (*)    GFR calc non Af Amer 64 (*)    GFR calc Af Amer 74 (*)    All other components within normal limits  TSH - Abnormal; Notable for the following:    TSH 0.225 (*)    All other components within normal limits  PHOSPHORUS - Abnormal; Notable for the following:    Phosphorus 5.4 (*)    All other components within normal limits  OCCULT BLOOD, POC DEVICE  LIPASE, BLOOD  MAGNESIUM  CBC  BASIC METABOLIC PANEL  PROTIME-INR   Dg Abd Acute W/chest  06/03/2011  *RADIOLOGY REPORT*  Clinical Data: Abdominal pain, vomiting  ACUTE ABDOMEN SERIES (ABDOMEN 2 VIEW & CHEST 1 VIEW)  Comparison: 08/27/2009  Findings: The cardiomediastinal silhouette is stable.  No convincing pulmonary edema. Bilateral basilar atelectasis or infiltrate.  Significant gaseous distended small bowel loops with multiple air- fluid levels best seen on decubitus view highly suspicious for small bowel obstruction.  No free abdominal air.  IMPRESSION: Bilateral basilar atelectasis or infiltrate.  Significant gaseous distended small bowel  loops with air-fluid levels highly suspicious for small bowel obstruction.  Original Report Authenticated By: Natasha Mead, M.D.     1. Partial bowel obstruction   2. CONSTIPATION   3. Cerebral palsy       MDM  The patient appears reasonably stabilized for admission considering the current resources, flow, and capabilities available in the ED at this time, and I doubt any other [facility] requiring further screening and/or treatment in the ED prior to admission.Pt feels improved after observation and/or treatment in ED.Patient / Family / Caregiver informed of clinical course, understand medical decision-making process, and agree with plan.       Hurman Horn, MD 06/03/11 2245

## 2011-06-03 NOTE — ED Notes (Signed)
Unable to insert in and out cath due to small meatus. EDP notified.

## 2011-06-03 NOTE — ED Notes (Signed)
Bed:WA01<BR> Expected date:<BR> Expected time:<BR> Means of arrival:<BR> Comments:<BR> Closed 

## 2011-06-03 NOTE — ED Notes (Signed)
Mother took trousers home. Shirt and T shirt taken to floor.

## 2011-06-03 NOTE — ED Notes (Addendum)
Pt continues trying to move bowels. Repeatedly asking for water.

## 2011-06-03 NOTE — ED Notes (Signed)
Pt is on Coumadin. 

## 2011-06-03 NOTE — H&P (Signed)
PCP:  Florentina Jenny, MD, MD   DOA:  06/03/2011  9:03 AM  Chief Complaint:  Constipation and abdominal pain  HPI: Pt is 31 year old male with a history of cerebral palsy and with some intellectual disability who also has chronic constipation as well as intermittent loose stools now presents with several hours apparently of abdominal distention, diffuse abdominal pain, as well as vomiting with some small blood-tinged component of the emesis without vomiting gross blood or coffee ground emesis, and constipation of unknown duration. Pt denies shortness of breath, chest pain, fevers, chills, no other systemic concerns. Most of the history was obtained from mother who was present at bedside. She reports that pt had SBO several years ago that has required surgical intervention.   Allergies: Allergies  Allergen Reactions  . Betadine (Povidone Iodine)   . Ciprofloxacin   . Morphine     Prior to Admission medications   Medication Sig Start Date End Date Taking? Authorizing Provider  acetaminophen (TYLENOL) 325 MG tablet Take 650 mg by mouth every 6 (six) hours as needed. For pain    Yes Historical Provider, MD  baclofen (LIORESAL) 20 MG tablet Take 20 mg by mouth 4 (four) times daily.     Yes Historical Provider, MD  calcium carbonate (TUMS - DOSED IN MG ELEMENTAL CALCIUM) 500 MG chewable tablet Chew 1 tablet by mouth 3 (three) times daily.   Yes Historical Provider, MD  cholecalciferol (VITAMIN D) 1000 UNITS tablet Take 1,000 Units by mouth 2 (two) times daily.   Yes Historical Provider, MD  cyclobenzaprine (FLEXERIL) 10 MG tablet Take 10 mg by mouth 3 (three) times daily.    Yes Historical Provider, MD  diazepam (VALIUM) 2 MG tablet Take 2 mg by mouth every 12 (twelve) hours.   Yes Historical Provider, MD  Glycerin, Laxative, (SANI-SUPP ADULT RE) Place rectally as needed. For constipation   Yes Historical Provider, MD  hydrochlorothiazide (HYDRODIURIL) 25 MG tablet Take 25 mg by mouth daily.   Yes  Historical Provider, MD  loperamide (IMODIUM) 2 MG capsule Take 2 mg by mouth 3 (three) times daily as needed. For diarrhea   Yes Historical Provider, MD  metoprolol (TOPROL-XL) 100 MG 24 hr tablet Take 100 mg by mouth daily.     Yes Historical Provider, MD  Multiple Vitamin (MULITIVITAMIN WITH MINERALS) TABS Take 1 tablet by mouth daily.   Yes Historical Provider, MD  nitrofurantoin (MACRODANTIN) 50 MG capsule Take 50 mg by mouth daily.   Yes Historical Provider, MD  sodium phosphate (FLEET) enema Place 1 enema rectally once. follow package directions    Yes Historical Provider, MD  warfarin (COUMADIN) 2 MG tablet Take 2 mg by mouth daily.   Yes Historical Provider, MD  warfarin (COUMADIN) 4 MG tablet Take 4 mg by mouth. Monday Wednesday and Friday   Yes Historical Provider, MD    Past Medical History  Diagnosis Date  . Hypertension   . Cerebral palsy   . Recurrent UTI   . Coagulopathy   . Insomnia   . Tachycardia   . Chronic constipation     Past Surgical History  Procedure Date  . Small intestine surgery   . Incise and drain abcess 09/11/10  . Elbow surgery 09/12/10    left    Social History:  reports that he has never smoked. He has never used smokeless tobacco. He reports that he does not drink alcohol or use illicit drugs.  No family history on file.  Review of Systems:  Constitutional: Denies fever, chills, diaphoresis, appetite change and fatigue.  HEENT: Denies photophobia, eye pain, redness, hearing loss, ear pain, congestion, sore throat, rhinorrhea, sneezing, mouth sores, trouble swallowing, neck pain, neck stiffness and tinnitus.   Respiratory: Denies SOB, DOE, cough, chest tightness,  and wheezing.   Cardiovascular: Denies chest pain, palpitations and leg swelling.  Gastrointestinal: per HPI Genitourinary: Denies dysuria, urgency, frequency, hematuria, flank pain and difficulty urinating.  Musculoskeletal: Denies myalgias, back pain, joint swelling, arthralgias  and gait problem.  Skin: Denies pallor, rash and wound.  Neurological: Denies dizziness, seizures, syncope, weakness, light-headedness, numbness and headaches.  Hematological: Denies adenopathy. Easy bruising, personal or family bleeding history  Psychiatric/Behavioral: Denies suicidal ideation, mood changes, confusion, nervousness, sleep disturbance and agitation  Physical Exam:  Filed Vitals:   06/03/11 0907  BP: 106/76  Pulse: 116  TempSrc: Oral  Resp: 18  SpO2: 100%    Constitutional: Vital signs reviewed.  Patient is in no acute distress and cooperative with exam. Unable to understand speech given pt's cerebral palsy Head: Normocephalic and atraumatic Ear: TM normal bilaterally Mouth: no erythema or exudates, MMM Eyes: PERRL, EOMI, conjunctivae normal, No scleral icterus.  Neck: Supple, Trachea midline normal ROM, No JVD, mass, thyromegaly, or carotid bruit present.  Cardiovascular: RRR, S1 normal, S2 normal, no MRG, pulses symmetric and intact bilaterally Pulmonary/Chest: CTAB, no wheezes, rales, or rhonchi Abdominal: Soft. Mildly tender in the epigastric area, non-distended, bowel sounds are normal, no masses, organomegaly, or guarding present. Musculoskeletal: No joint deformities, erythema, or stiffness, ROM full and no nontender Ext: no edema and no cyanosis, pulses palpable bilaterally (DP and PT)  Labs on Admission:  Results for orders placed during the hospital encounter of 06/03/11 (from the past 48 hour(s))  CBC     Status: Abnormal   Collection Time   06/03/11  9:40 AM      Component Value Range Comment   WBC 4.9  4.0 - 10.5 (K/uL)    RBC 5.67  4.22 - 5.81 (MIL/uL)    Hemoglobin 18.1 (*) 13.0 - 17.0 (g/dL)    HCT 45.4  09.8 - 11.9 (%)    MCV 87.3  78.0 - 100.0 (fL)    MCH 31.9  26.0 - 34.0 (pg)    MCHC 36.6 (*) 30.0 - 36.0 (g/dL)    RDW 14.7  82.9 - 56.2 (%)    Platelets 221  150 - 400 (K/uL)   DIFFERENTIAL     Status: Abnormal   Collection Time   06/03/11   9:40 AM      Component Value Range Comment   Neutrophils Relative 79 (*) 43 - 77 (%)    Lymphocytes Relative 5 (*) 12 - 46 (%)    Monocytes Relative 16 (*) 3 - 12 (%)    Eosinophils Relative 0  0 - 5 (%)    Basophils Relative 0  0 - 1 (%)    Neutro Abs 3.9  1.7 - 7.7 (K/uL)    Lymphs Abs 0.2 (*) 0.7 - 4.0 (K/uL)    Monocytes Absolute 0.8  0.1 - 1.0 (K/uL)    Eosinophils Absolute 0.0  0.0 - 0.7 (K/uL)    Basophils Absolute 0.0  0.0 - 0.1 (K/uL)    RBC Morphology SPHEROCYTES      WBC Morphology VACUOLATED NEUTROPHILS      Smear Review PLATELET CLUMPS NOTED ON SMEAR     OCCULT BLOOD, POC DEVICE     Status: Normal   Collection Time  06/03/11  9:44 AM      Component Value Range Comment   Fecal Occult Bld NEGATIVE     PROTIME-INR     Status: Abnormal   Collection Time   06/03/11 10:15 AM      Component Value Range Comment   Prothrombin Time 24.8 (*) 11.6 - 15.2 (seconds)    INR 2.20 (*) 0.00 - 1.49    COMPREHENSIVE METABOLIC PANEL     Status: Abnormal   Collection Time   06/03/11 10:15 AM      Component Value Range Comment   Sodium 138  135 - 145 (mEq/L)    Potassium 4.0  3.5 - 5.1 (mEq/L)    Chloride 96  96 - 112 (mEq/L)    CO2 25  19 - 32 (mEq/L)    Glucose, Bld 138 (*) 70 - 99 (mg/dL)    BUN 30 (*) 6 - 23 (mg/dL)    Creatinine, Ser 6.96 (*) 0.50 - 1.35 (mg/dL)    Calcium 29.5  8.4 - 10.5 (mg/dL)    Total Protein 7.8  6.0 - 8.3 (g/dL)    Albumin 4.4  3.5 - 5.2 (g/dL)    AST 21  0 - 37 (U/L)    ALT 17  0 - 53 (U/L)    Alkaline Phosphatase 72  39 - 117 (U/L)    Total Bilirubin 1.3 (*) 0.3 - 1.2 (mg/dL)    GFR calc non Af Amer 64 (*) >90 (mL/min)    GFR calc Af Amer 74 (*) >90 (mL/min)   LIPASE, BLOOD     Status: Normal   Collection Time   06/03/11 10:15 AM      Component Value Range Comment   Lipase 16  11 - 59 (U/L)     Radiological Exams on Admission:  Abdominal XRAY: 06/03/2011 IMPRESSION:  Bilateral basilar atelectasis or infiltrate. Significant gaseous distended small  bowel loops with air-fluid levels highly suspicious for small bowel obstruction.    Assessment/Plan  SBO - will treat symptomatically for now with IVF, antiemetics, analgesia for adequate pain control - continue decompression with enema as pt is responding well and has had one BM already - pt does not want to have surgery yet but wants to proceed with supportive care first - will admit to inpatient medical floor for further evaluation and management  ARF - secondary to pre renal etiology - will continue to provide IVF - BMP in AM  DVT Prophylaxis - on coumadin for therapeutic anticoagulation   Code Status - Full  Education  - test results and diagnostic studies were discussed with patient and pt's family who was present at the bedside (mother) - patient and family have verbalized the understanding - questions were answered at the bedside and contact information was provided for additional questions or concerns  Time Spent on Admission: Over 30 minutes  MAGICK-Secilia Apps 06/03/2011, 12:53 PM  [facility] Pager # [phone] Main Office # [phone]

## 2011-06-03 NOTE — ED Notes (Signed)
Received per EMS from [facility]. C.P pt, staff reports specks of blood in emesis.  Abdomen distended and firm.

## 2011-06-03 NOTE — ED Notes (Signed)
Mother, Armond Hang    [phone]

## 2011-06-03 NOTE — Progress Notes (Signed)
ANTICOAGULATION CONSULT NOTE - Initial Consult  Pharmacy Consult for Coumadin Indication: atrial fibrillation  Allergies  Allergen Reactions  . Betadine (Povidone Iodine)   . Ciprofloxacin   . Morphine    Patient Measurements: Height: 5\' 8"  (172.7 cm) Weight: 160 lb (72.576 kg) IBW/kg (Calculated) : 68.4   Vital Signs: Temp: 97.8 F (36.6 C) (05/06 1535) Temp src: Oral (05/06 1535) BP: 132/70 mmHg (05/06 1535) Pulse Rate: 120  (05/06 1535)  Labs:  [facility] 06/03/11 1015 06/03/11 0940  HGB -- 18.1*  HCT -- 49.5  PLT -- 221  APTT -- --  LABPROT 24.8* --  INR 2.20* --  HEPARINUNFRC -- --  CREATININE 1.44* --  CKTOTAL -- --  CKMB -- --  TROPONINI -- --   Estimated Creatinine Clearance: 71.9 ml/min (by C-G formula based on Cr of 1.44).  Medical History: Past Medical History  Diagnosis Date  . Hypertension   . Cerebral palsy   . Recurrent UTI   . Coagulopathy   . Insomnia   . Tachycardia   . Chronic constipation     Medications:  Prescriptions prior to admission  Medication Sig Dispense Refill  . acetaminophen (TYLENOL) 325 MG tablet Take 650 mg by mouth every 6 (six) hours as needed. For pain       . baclofen (LIORESAL) 20 MG tablet Take 20 mg by mouth 4 (four) times daily.        . calcium carbonate (TUMS - DOSED IN MG ELEMENTAL CALCIUM) 500 MG chewable tablet Chew 1 tablet by mouth 3 (three) times daily.      . cholecalciferol (VITAMIN D) 1000 UNITS tablet Take 1,000 Units by mouth 2 (two) times daily.      . cyclobenzaprine (FLEXERIL) 10 MG tablet Take 10 mg by mouth 3 (three) times daily.       . diazepam (VALIUM) 2 MG tablet Take 2 mg by mouth every 12 (twelve) hours.      . Glycerin, Laxative, (SANI-SUPP ADULT RE) Place rectally as needed. For constipation      . hydrochlorothiazide (HYDRODIURIL) 25 MG tablet Take 25 mg by mouth daily.      Marland Kitchen loperamide (IMODIUM) 2 MG capsule Take 2 mg by mouth 3 (three) times daily as needed. For diarrhea      .  metoprolol (TOPROL-XL) 100 MG 24 hr tablet Take 100 mg by mouth daily.        . Multiple Vitamin (MULITIVITAMIN WITH MINERALS) TABS Take 1 tablet by mouth daily.      . nitrofurantoin (MACRODANTIN) 50 MG capsule Take 50 mg by mouth daily.      . sodium phosphate (FLEET) enema Place 1 enema rectally once. follow package directions       . warfarin (COUMADIN) 2 MG tablet Take 2 mg by mouth daily. Tuesday Thursday Saturday and Sunday ONLY      . warfarin (COUMADIN) 4 MG tablet Take 4 mg by mouth. Monday Wednesday and Friday        Assessment:  30yo M with cerebral palsy admitted with abd distention, constipation, N/V, suspected SBO. Already responded to enema.   Reported blood-tinge to emesis but no gross blood or coffee ground emesis.   Continuing chronic Coumadin for A.fib.  INR is in goal range.   Goal of Therapy:  INR 2-3   Plan:   Give Coumadin 4mg  today.  F/u daily.  Charolotte Eke, PharmD, pager [phone]. 06/03/2011,5:13 PM.

## 2011-06-03 NOTE — Progress Notes (Signed)
Patient request 2nd fleetst be held until 5/7 in AM, K. Schor night coverage notified, patient had large results from enema earlier today, permission to change to AM 5/7 given

## 2011-06-04 ENCOUNTER — Inpatient Hospital Stay (HOSPITAL_COMMUNITY): Payer: 59

## 2011-06-04 DIAGNOSIS — G808 Other cerebral palsy: Secondary | ICD-10-CM

## 2011-06-04 DIAGNOSIS — K56609 Unspecified intestinal obstruction, unspecified as to partial versus complete obstruction: Secondary | ICD-10-CM

## 2011-06-04 DIAGNOSIS — R1033 Periumbilical pain: Secondary | ICD-10-CM

## 2011-06-04 LAB — CBC
Hemoglobin: 14.6 g/dL (ref 13.0–17.0)
MCH: 30 pg (ref 26.0–34.0)
Platelets: 188 10*3/uL (ref 150–400)
RBC: 4.74 MIL/uL (ref 4.22–5.81)
WBC: 4.9 10*3/uL (ref 4.0–10.5)

## 2011-06-04 LAB — BASIC METABOLIC PANEL
CO2: 27 mEq/L (ref 19–32)
Calcium: 8.4 mg/dL (ref 8.4–10.5)
Chloride: 103 mEq/L (ref 96–112)
Glucose, Bld: 126 mg/dL — ABNORMAL HIGH (ref 70–99)
Potassium: 4.1 mEq/L (ref 3.5–5.1)
Sodium: 143 mEq/L (ref 135–145)

## 2011-06-04 LAB — PROTIME-INR
INR: 3.47 — ABNORMAL HIGH (ref 0.00–1.49)
Prothrombin Time: 35.4 seconds — ABNORMAL HIGH (ref 11.6–15.2)

## 2011-06-04 MED ORDER — FLEET ENEMA 7-19 GM/118ML RE ENEM
1.0000 | ENEMA | Freq: Once | RECTAL | Status: AC
  Administered 2011-06-04: 1 via RECTAL
  Filled 2011-06-04: qty 1

## 2011-06-04 MED ORDER — LORAZEPAM 2 MG/ML IJ SOLN
1.0000 mg | Freq: Once | INTRAMUSCULAR | Status: AC | PRN
  Administered 2011-06-04: 1 mg via INTRAVENOUS
  Filled 2011-06-04: qty 1

## 2011-06-04 MED ORDER — DIAZEPAM 5 MG/ML IJ SOLN
5.0000 mg | Freq: Once | INTRAMUSCULAR | Status: DC
  Filled 2011-06-04 (×2): qty 2

## 2011-06-04 MED ORDER — ONDANSETRON HCL 4 MG/2ML IJ SOLN
4.0000 mg | INTRAMUSCULAR | Status: DC | PRN
  Administered 2011-06-04 (×2): 4 mg via INTRAVENOUS
  Filled 2011-06-04 (×2): qty 2

## 2011-06-04 MED ORDER — IOHEXOL 300 MG/ML  SOLN
100.0000 mL | Freq: Once | INTRAMUSCULAR | Status: AC | PRN
  Administered 2011-06-04: 100 mL via INTRAVENOUS

## 2011-06-04 MED ORDER — METOPROLOL SUCCINATE ER 100 MG PO TB24
100.0000 mg | ORAL_TABLET | Freq: Two times a day (BID) | ORAL | Status: DC
  Administered 2011-06-04 – 2011-06-10 (×9): 100 mg via ORAL
  Filled 2011-06-04 (×14): qty 1

## 2011-06-04 NOTE — Progress Notes (Signed)
ANTICOAGULATION CONSULT NOTE - Follow Up Consult  Pharmacy Consult for Coumadin Indication: atrial fibrillation  Allergies  Allergen Reactions  . Betadine (Povidone Iodine)   . Ciprofloxacin   . Morphine     Patient Measurements: Height: 5\' 8"  (172.7 cm) Weight: 160 lb (72.576 kg) IBW/kg (Calculated) : 68.4   Vital Signs: Temp: 98.9 F (37.2 C) (05/07 0535) Temp src: Axillary (05/07 0535) BP: 136/78 mmHg (05/07 0535) Pulse Rate: 117  (05/07 0535)  Labs:  [facility] 06/04/11 0355 06/03/11 1015 06/03/11 0940  HGB 14.6 -- 18.1*  HCT 42.8 -- 49.5  PLT 188 -- 221  APTT -- -- --  LABPROT 35.4* 24.8* --  INR 3.47* 2.20* --  HEPARINUNFRC -- -- --  CREATININE 1.03 1.44* --  CKTOTAL -- -- --  CKMB -- -- --  TROPONINI -- -- --   Estimated Creatinine Clearance: 100.5 ml/min (by C-G formula based on Cr of 1.03).   Medications:  Scheduled:    . baclofen  20 mg Oral QID  . bisacodyl  10 mg Rectal BID  . diazepam  2 mg Oral Q12H  . hydrochlorothiazide  25 mg Oral Daily  . metoprolol succinate  100 mg Oral Daily  . ondansetron (ZOFRAN) IV  4 mg Intravenous Once  . polyethylene glycol  17 g Oral Daily  . sodium chloride  2,000 mL Intravenous Once  . sodium phosphate  1 enema Rectal Once  . sodium phosphate  1 enema Rectal Once  . warfarin  4 mg Oral ONCE-1800  . Warfarin - Pharmacist Dosing Inpatient   Does not apply q1800  . DISCONTD: sodium phosphate  1 enema Rectal Once  . DISCONTD: sodium phosphate  1 enema Rectal Once  . DISCONTD: warfarin  2 mg Oral Daily    Assessment:  Admit for probable SBO, note of blood-tinged emesis but no gross blood or coffee-ground emesis.  Hx of chronic Coumadin for Afib: home dose 4mg  MWF, and 2mg  other days.  Hx Cerebral Palsy  No Coumadin given yesterday, INR above goal range today.  NPO, symptomatic treatment. Hx of previous SBO that required surgery.  Goal of Therapy:  INR 2-3   Plan:  No Coumadin today, Continue daily  protime/INR  Otho Bellows PharmD Pager [phone] 06/04/2011,8:23 AM

## 2011-06-04 NOTE — Progress Notes (Signed)
Clinical Social Work Department BRIEF PSYCHOSOCIAL ASSESSMENT 06/04/2011  Patient:  [name], [name]     Account Number:  [id number]     Admit date:  06/03/2011  Clinical Social Worker:  Tommi Emery, CLINICAL SOCIAL WORKER  Date/Time:  06/04/2011 10:48 AM  Referred by:  Physician  Date Referred:  06/04/2011 Referred for  Other - See comment   Other Referral:   Pt is from [facility] and will be returning. If not then SNF options will be sought.   Interview type:  Other - See comment Other interview type:   with pt and [facility] contracted employee    PSYCHOSOCIAL DATA Living Status:  FACILITY Admitted from facility:  [facility] Level of care:  Skilled Nursing Facility Primary support name:  Kathlene November and [place] Primary support relationship to patient:  PARENT Degree of support available:   Good    CURRENT CONCERNS Current Concerns  Other - See comment   Other Concerns:   pt has some other disability and  limitation along with his current medical issues.    SOCIAL WORK ASSESSMENT / PLAN to follow up on pt and assist with placement.   Assessment/plan status:  Psychosocial Support/Ongoing Assessment of Needs Other assessment/ plan:   Information/referral to community resources:    PATIENT'S/FAMILY'S RESPONSE TO PLAN OF CARE: the pt is in agreeent.         Kayleen Memos. Leighton Ruff [phone]

## 2011-06-04 NOTE — Progress Notes (Signed)
CSW did a consult. The pt was aware. The pt had a quest in the room. CSW consulted about progress and plan upon discharge. The pt plans to return to [facility] house.  Kayleen Memos. Leighton Ruff [phone]

## 2011-06-04 NOTE — Progress Notes (Signed)
Patient ID: [name], male   DOB: Oct 09, 1980, 31 y.o.   MRN: [id number]  BRIEF HPI: Pt is 31 year old male with a history of cerebral palsy and with some intellectual disability who also has chronic constipation as well as intermittent loose stools presented to [facility] ED with main concern of abdominal pain, constipation of unknown duration and was found to have SBO as per Abdominal XRAY findings. Pt was given Enema x 2 and has had BM in ED and once upon arrival to the unit.   Subjective: No events overnight. Patient denies chest pain, shortness of breath, abdominal pain. Per nurse pt has had one episode of vomiting earlier in the morning.   Objective:  Vital signs in last 24 hours:  Filed Vitals:   06/03/11 1446 06/03/11 1535 06/03/11 2045 06/04/11 0535  BP: 136/80 132/70 122/78 136/78  Pulse: 118 120 112 117  Temp:  97.8 F (36.6 C) 97.9 F (36.6 C) 98.9 F (37.2 C)  SpO2: 95% 94% 94% 92%   Intake/Output from previous day:  Intake/Output Summary (Last 24 hours) at 06/04/11 1213 Last data filed at 06/04/11 3086  Gross per 24 hour  Intake   1710 ml  Output    700 ml  Net   1010 ml   Physical Exam: General: Alert, awake, in no acute distress. HEENT: No bruits, no goiter. Moist mucous membranes, no scleral icterus, no conjunctival pallor. Heart: Regular rate and rhythm, S1/S2 +, no murmurs, rubs, gallops. Lungs: Clear to auscultation bilaterally. No wheezing, no rhonchi, no rales.  Abdomen: Soft, tender in epigastric area, mildly distended, positive bowel sounds. Extremities: No clubbing or cyanosis, no pitting edema,  positive pedal pulses. Neuro: Grossly nonfocal.  Lab Results:  Lab 06/04/11 0355 06/03/11 0940  WBC 4.9 4.9  HGB 14.6 18.1*  HCT 42.8 49.5  PLT 188 221    Lab 06/04/11 0355 06/03/11 1015  NA 143 138  K 4.1 4.0  CL 103 96  CO2 27 25  GLUCOSE 126* 138*  BUN 23 30*  CREATININE 1.03 1.44*  CALCIUM 8.4 10.3  MG -- 2.0    Lab 06/04/11 0355  06/03/11 1015  INR 3.47* 2.20*  PROTIME -- --    Studies/Results:  Dg Abd Acute W/chest 06/03/2011    IMPRESSION:  Bilateral basilar atelectasis or infiltrate.   Significant gaseous distended small bowel loops with air-fluid levels highly suspicious for small bowel obstruction.    Medications: Scheduled Meds:   . baclofen  20 mg Oral QID  . bisacodyl  10 mg Rectal BID  . diazepam  2 mg Oral Q12H  . hydrochlorothiazide  25 mg Oral Daily  . metoprolol succinate  100 mg Oral Daily  . polyethylene glycol  17 g Oral Daily  . sodium phosphate  1 enema Rectal Once  . sodium phosphate  1 enema Rectal Once  . warfarin  4 mg Oral ONCE-1800  . Warfarin   Does not apply q1800   Continuous Infusions:   . sodium chloride 100 mL/hr at 06/04/11 0917  . DISCONTD: sodium chloride 125 mL/hr at 06/03/11 1106   PRN Meds:.HYDROmorphone (DILAUDID) injection, ondansetron, sodium phosphate, DISCONTD: ondansetron, DISCONTD: promethazine, DISCONTD: promethazine  Assessment/Plan:  SBO  - some clinical improvement but repeat abdominal XRAY not showing much resolution - recommendation is to obtain CT abdomen and pelvis and will order that today - surgery was initially consulted but their recommendation was to proceed with decompression as possible and if no clinical improvement we need to re  consult - continue to treat symptomatically with IVF, antiemetics, analgesia for adequate pain control  - continue with enema as pt seems to be responding well and has had one BM already  - keep NPO except ice chips  ARF  - secondary to pre renal etiology  - will continue to provide IVF  - creatinine is now within normal limits - BMP in AM DVT Prophylaxis - on coumadin for therapeutic anticoagulation   Code Status - Full   Education  - test results and diagnostic studies were discussed with patient and pt's family who was present at the bedside (mother)  - patient and family have verbalized the  understanding  - questions were answered at the bedside and contact information was provided for additional questions or concerns    LOS: 1 day   MAGICK-Deigo Alonso 06/04/2011, 12:13 PM  [facility] Pager: [phone]

## 2011-06-05 ENCOUNTER — Inpatient Hospital Stay (HOSPITAL_COMMUNITY): Payer: 59

## 2011-06-05 DIAGNOSIS — R229 Localized swelling, mass and lump, unspecified: Secondary | ICD-10-CM

## 2011-06-05 DIAGNOSIS — K5909 Other constipation: Secondary | ICD-10-CM | POA: Diagnosis present

## 2011-06-05 DIAGNOSIS — E876 Hypokalemia: Secondary | ICD-10-CM | POA: Diagnosis present

## 2011-06-05 DIAGNOSIS — R1900 Intra-abdominal and pelvic swelling, mass and lump, unspecified site: Secondary | ICD-10-CM

## 2011-06-05 DIAGNOSIS — R1909 Other intra-abdominal and pelvic swelling, mass and lump: Secondary | ICD-10-CM | POA: Diagnosis present

## 2011-06-05 DIAGNOSIS — K56609 Unspecified intestinal obstruction, unspecified as to partial versus complete obstruction: Secondary | ICD-10-CM | POA: Diagnosis present

## 2011-06-05 DIAGNOSIS — R1033 Periumbilical pain: Secondary | ICD-10-CM

## 2011-06-05 DIAGNOSIS — I10 Essential (primary) hypertension: Secondary | ICD-10-CM | POA: Diagnosis present

## 2011-06-05 DIAGNOSIS — R7989 Other specified abnormal findings of blood chemistry: Secondary | ICD-10-CM | POA: Diagnosis present

## 2011-06-05 DIAGNOSIS — G808 Other cerebral palsy: Secondary | ICD-10-CM

## 2011-06-05 DIAGNOSIS — N179 Acute kidney failure, unspecified: Secondary | ICD-10-CM | POA: Diagnosis present

## 2011-06-05 LAB — CBC
Platelets: 191 10*3/uL (ref 150–400)
RBC: 4.47 MIL/uL (ref 4.22–5.81)
RDW: 13.8 % (ref 11.5–15.5)
WBC: 5.5 10*3/uL (ref 4.0–10.5)

## 2011-06-05 LAB — BASIC METABOLIC PANEL
CO2: 25 mEq/L (ref 19–32)
Chloride: 103 mEq/L (ref 96–112)
Creatinine, Ser: 0.82 mg/dL (ref 0.50–1.35)
GFR calc Af Amer: >90 mL/min (ref >90)
Potassium: 3.2 mEq/L — ABNORMAL LOW (ref 3.5–5.1)
Sodium: 139 mEq/L (ref 135–145)

## 2011-06-05 LAB — PROTIME-INR
INR: 3.53 — ABNORMAL HIGH (ref 0.00–1.49)
Prothrombin Time: 35.9 seconds — ABNORMAL HIGH (ref 11.6–15.2)

## 2011-06-05 MED ORDER — POTASSIUM CHLORIDE 10 MEQ/100ML IV SOLN
10.0000 meq | INTRAVENOUS | Status: AC
  Administered 2011-06-05 (×4): 10 meq via INTRAVENOUS
  Filled 2011-06-05 (×4): qty 100

## 2011-06-05 MED ORDER — DIAZEPAM 5 MG/ML IJ SOLN
2.5000 mg | Freq: Once | INTRAMUSCULAR | Status: AC
  Administered 2011-06-05: 2.5 mg via INTRAVENOUS

## 2011-06-05 MED ORDER — DIAZEPAM 5 MG/ML IJ SOLN
5.0000 mg | Freq: Four times a day (QID) | INTRAMUSCULAR | Status: DC | PRN
  Administered 2011-06-05 (×2): 5 mg via INTRAVENOUS
  Filled 2011-06-05 (×2): qty 2

## 2011-06-05 MED ORDER — POTASSIUM CHLORIDE IN NACL 40-0.9 MEQ/L-% IV SOLN
INTRAVENOUS | Status: DC
  Administered 2011-06-05 – 2011-06-06 (×2): via INTRAVENOUS
  Filled 2011-06-05 (×4): qty 1000

## 2011-06-05 NOTE — Progress Notes (Signed)
Received order to insert NGT per Tama Gander, NP. Explained procedure to pt, he verbalized understanding and  refused procedure, notified Tama Gander, NP.

## 2011-06-05 NOTE — Progress Notes (Signed)
Ct abd/pelvis findings c/w SBO that will likely need surgical intervention.  NGT ordered (spoke w/ parents by phone and pt at bedside). Pt initially agreeable but ultimately refused NGT. Has been kept strickly NPO after midnite. Additional incidental findings on CT (4cm "vascular mass" (L) groin). Doppler U/S recommened for further eval. LLE doppler w/ eval of mass ordered for am (per recommendation of radiologist). Discussed plan w/ Dr Joneen Roach who is in agreement w/ plan.

## 2011-06-05 NOTE — Consult Note (Signed)
Reason for Consult: SBO Referring Physician: Dr. Salena Saner. Michael  Ngai [name] is an 31 y.o. male.  HPI: Patient is a 31 year old male who was admitted by the medicine service on 06/03/2011 with constipation abdominal pain. 3 view abdominal and chest exam showed distended small bowel loops with air-fluid levels consistent with a small bowel obstruction. He was admitted and has been on bowel rest and IV hydration. He has refused NG placement. Yesterday on 06/04/2011 he underwent a repeat abdominal film which shows persistent small bowel consistent with a small bowel obstruction. CT shows small bowel dilatation up to 5.5 cm. The patient had a right hemicolectomy with an ileus colic anastomosis and this appears to be where the transition zone is.  CT was read as possible mechanical obstruction possibly secondary to adhesions. Also a possibility of a stricture at the proximal  to the anastomosis. In addition to this there is also a 4 cm vascular mass the left groin the direct differential being an AV fistula, pseudoaneurysm, versus a venous varix. There also stones noted in the gallbladder and left kidney. Patient continues to be obstructed, but he's had some stool on 2 occasions today.n We are ask to see in consultation.   Past Medical History  Diagnosis Date  . Hypertension   .  Cerebral palsy   . Recurrent UTI, neurogenic bladder   . Coagulopathy Hx of DVT on coumadin   . Insomnia   .  Sinus Tachycardia   . Chronic constipation Hx of SBO 09/2008 Quadraplegia wheelchair and bed bound Contractures Hx of MRSA Left elbow infection 10/2010 Hx of chronic RBBB Possible hx of C.Diff colitis     Past Surgical History  Procedure Date  . Small bowel obstruction due to ischemia.  With resection [name] in 2000   . Incise and drain abcess 09/11/10  . Elbow surgery 09/12/10    left    History reviewed. No pertinent family history.  Social History:  reports that he has never smoked. He has never used  smokeless tobacco. He reports that he does not drink alcohol or use illicit drugs.  Allergies:  Allergies  Allergen Reactions  . Betadine (Povidone Iodine)   . Ciprofloxacin   . Morphine     Medications:  Prior to Admission:  Prescriptions prior to admission  Medication Sig Dispense Refill  . acetaminophen (TYLENOL) 325 MG tablet Take 650 mg by mouth every 6 (six) hours as needed. For pain       . baclofen (LIORESAL) 20 MG tablet Take 20 mg by mouth 4 (four) times daily.        . calcium carbonate (TUMS - DOSED IN MG ELEMENTAL CALCIUM) 500 MG chewable tablet Chew 1 tablet by mouth 3 (three) times daily.      . cholecalciferol (VITAMIN D) 1000 UNITS tablet Take 1,000 Units by mouth 2 (two) times daily.      . cyclobenzaprine (FLEXERIL) 10 MG tablet Take 10 mg by mouth 3 (three) times daily.       . diazepam (VALIUM) 2 MG tablet Take 2 mg by mouth every 12 (twelve) hours.      . Glycerin, Laxative, (SANI-SUPP ADULT RE) Place rectally as needed. For constipation      . hydrochlorothiazide (HYDRODIURIL) 25 MG tablet Take 25 mg by mouth daily.      Marland Kitchen loperamide (IMODIUM) 2 MG capsule Take 2 mg by mouth 3 (three) times daily as needed. For diarrhea      . metoprolol (TOPROL-XL) 100  MG 24 hr tablet Take 100 mg by mouth daily.        . Multiple Vitamin (MULITIVITAMIN WITH MINERALS) TABS Take 1 tablet by mouth daily.      . nitrofurantoin (MACRODANTIN) 50 MG capsule Take 50 mg by mouth daily.      . sodium phosphate (FLEET) enema Place 1 enema rectally once. follow package directions       . warfarin (COUMADIN) 2 MG tablet Take 2 mg by mouth daily. Tuesday Thursday Saturday and Sunday ONLY      . warfarin (COUMADIN) 4 MG tablet Take 4 mg by mouth. Monday Wednesday and Friday       Scheduled:   . baclofen  20 mg Oral QID  . bisacodyl  10 mg Rectal BID  . diazepam  2.5 mg Intravenous Once  . diazepam  5 mg Intravenous Once  . diazepam  2 mg Oral Q12H  . hydrochlorothiazide  25 mg Oral  Daily  . metoprolol succinate  100 mg Oral BID  . polyethylene glycol  17 g Oral Daily  . potassium chloride  10 mEq Intravenous Q1 Hr x 4  . sodium phosphate  1 enema Rectal Once  . Warfarin - Pharmacist Dosing Inpatient   Does not apply q1800  . DISCONTD: metoprolol succinate  100 mg Oral Daily   Continuous:   . 0.9 % NaCl with KCl 40 mEq / L 100 mL/hr at 06/05/11 0859  . DISCONTD: sodium chloride Stopped (06/05/11 0901)   WGN:FAOZHYQM, HYDROmorphone (DILAUDID) injection, iohexol, LORazepam, ondansetron Anti-infectives    None      Results for orders placed during the hospital encounter of 06/03/11 (from the past 48 hour(s))  URINALYSIS, ROUTINE W REFLEX MICROSCOPIC     Status: Abnormal   Collection Time   06/03/11  6:09 PM      Component Value Range Comment   Color, Urine AMBER (*) YELLOW  BIOCHEMICALS MAY BE AFFECTED BY COLOR   APPearance CLOUDY (*) CLEAR     Specific Gravity, Urine 1.029  1.005 - 1.030     pH 6.0  5.0 - 8.0     Glucose, UA NEGATIVE  NEGATIVE (mg/dL)    Hgb urine dipstick NEGATIVE  NEGATIVE     Bilirubin Urine SMALL (*) NEGATIVE     Ketones, ur 40 (*) NEGATIVE (mg/dL)    Protein, ur NEGATIVE  NEGATIVE (mg/dL)    Urobilinogen, UA 0.2  0.0 - 1.0 (mg/dL)    Nitrite NEGATIVE  NEGATIVE     Leukocytes, UA NEGATIVE  NEGATIVE  MICROSCOPIC NOT DONE ON URINES WITH NEGATIVE PROTEIN, BLOOD, LEUKOCYTES, NITRITE, OR GLUCOSE <1000 mg/dL.  CBC     Status: Normal   Collection Time   06/04/11  3:55 AM      Component Value Range Comment   WBC 4.9  4.0 - 10.5 (K/uL)    RBC 4.74  4.22 - 5.81 (MIL/uL)    Hemoglobin 14.6  13.0 - 17.0 (g/dL)    HCT 57.8  46.9 - 62.9 (%)    MCV 90.3  78.0 - 100.0 (fL)    MCH 30.0  26.0 - 34.0 (pg)    MCHC 33.2  30.0 - 36.0 (g/dL)    RDW 52.8  41.3 - 24.4 (%)    Platelets 188  150 - 400 (K/uL)   BASIC METABOLIC PANEL     Status: Abnormal   Collection Time   06/04/11  3:55 AM      Component Value Range Comment  Sodium 143  135 - 145 (mEq/L)     Potassium 4.1  3.5 - 5.1 (mEq/L)    Chloride 103  96 - 112 (mEq/L)    CO2 27  19 - 32 (mEq/L)    Glucose, Bld 126 (*) 70 - 99 (mg/dL)    BUN 23  6 - 23 (mg/dL)    Creatinine, Ser 1.61  0.50 - 1.35 (mg/dL)    Calcium 8.4  8.4 - 10.5 (mg/dL)    GFR calc non Af Amer >90  >90 (mL/min)    GFR calc Af Amer >90  >90 (mL/min)   PROTIME-INR     Status: Abnormal   Collection Time   06/04/11  3:55 AM      Component Value Range Comment   Prothrombin Time 35.4 (*) 11.6 - 15.2 (seconds)    INR 3.47 (*) 0.00 - 1.49    PROTIME-INR     Status: Abnormal   Collection Time   06/05/11  3:45 AM      Component Value Range Comment   Prothrombin Time 35.9 (*) 11.6 - 15.2 (seconds)    INR 3.53 (*) 0.00 - 1.49    CBC     Status: Normal   Collection Time   06/05/11  3:45 AM      Component Value Range Comment   WBC 5.5  4.0 - 10.5 (K/uL)    RBC 4.47  4.22 - 5.81 (MIL/uL)    Hemoglobin 13.4  13.0 - 17.0 (g/dL)    HCT 09.6  04.5 - 40.9 (%)    MCV 91.1  78.0 - 100.0 (fL)    MCH 30.0  26.0 - 34.0 (pg)    MCHC 32.9  30.0 - 36.0 (g/dL)    RDW 81.1  91.4 - 78.2 (%)    Platelets 191  150 - 400 (K/uL)   BASIC METABOLIC PANEL     Status: Abnormal   Collection Time   06/05/11  3:45 AM      Component Value Range Comment   Sodium 139  135 - 145 (mEq/L)    Potassium 3.2 (*) 3.5 - 5.1 (mEq/L) DELTA CHECK NOTED   Chloride 103  96 - 112 (mEq/L)    CO2 25  19 - 32 (mEq/L)    Glucose, Bld 87  70 - 99 (mg/dL)    BUN 16  6 - 23 (mg/dL)    Creatinine, Ser 9.56  0.50 - 1.35 (mg/dL)    Calcium 7.8 (*) 8.4 - 10.5 (mg/dL)    GFR calc non Af Amer >90  >90 (mL/min)    GFR calc Af Amer >90  >90 (mL/min)     Ct Abdomen Pelvis W Contrast  06/04/2011  *RADIOLOGY REPORT*  Clinical Data: Persistent small bowel obstruction with nausea vomiting.  CT ABDOMEN AND PELVIS WITH CONTRAST  Technique:  Multidetector CT imaging of the abdomen and pelvis was performed following the standard protocol during bolus administration of intravenous  contrast.  Contrast: OMNIPAQUE IOHEXOL 300 MG/ML  SOLN  Comparison: 08/16/2009  Findings: Patchy airspace disease in the posterior left lower lobe is compatible with atelectasis or infiltrate.  No focal abnormalities seen in the liver or spleen.  The stomach, duodenum, pancreas, and adrenal glands are unremarkable.  Calcified stones are seen in the lumen of the gallbladder.  Tiny cortical cyst noted in the left kidney.  Right kidney is unremarkable.  There is diffuse fluid filled dilated small bowel measuring up to 5.5 cm in diameter. The the patient  has had a right hemicolectomy and ileocolic anastomoses is seen in the right upper quadrant of the abdomen.  Unfortunately, the patient moved while images were being obtained through this region and the motion artifact degrades the image quality this region.  Nevertheless, there does appear to be a transition zone from a markedly dilated loop anterior to the liver to a completely collapsed loop leading into the entero-colic anastomosis.  There is some fluid and gas in the colon diffusely down to the level of the rectum.  The superior mesenteric artery and vein are patent.  Portal vein is patent.  No abdominal aortic aneurysm.  The SMA and IMA are patent.  Imaging through the pelvis shows no substantial intraperitoneal free fluid.  In the left groin, there is a 3.7 x 2.7 cm irregular vascular lesion which may be a pseudoaneurysm, AV fistula, or venous varix.  There is prominent collateralization in the subcutaneous tissues of the lower left anterior abdominal wall.  Bone windows reveal no worrisome lytic or sclerotic osseous lesions.  IMPRESSION: Diffusely dilated small bowel ( measuring up to 5.5 cm in diameter) tracks up to an apparent transition zone just proximal to the ileocolic anastomoses.  Imaging features suggest mechanical obstruction, potentially secondary to adhesion. Small bowel stricture proximal to the anastomosis would also be a consideration.   Approximately 4 cm vascular "mass" in the left groin region.  Both the common femoral artery and vein track in/or through this structure.  Given the lack of differential enhancement between the arterial and venous anatomy on this study, it is difficult to determine the etiology of this finding.  Differential considerations would include a pseudoaneurysm, AV fistula, or venous varix.  A focused ultrasound exam may prove helpful to further evaluate.  I personally discussed the results of this study with K. Schorr, mid-level for the hospitalist service, at 2019 hours on 06/04/2011.  Original Report Authenticated By: ERIC A. MANSELL, M.D.   Dg Abd Acute W/chest  06/04/2011  *RADIOLOGY REPORT*  Clinical Data: Small bowel obstruction, abdominal pain, follow-up  ACUTE ABDOMEN SERIES (ABDOMEN 2 VIEW & CHEST 1 VIEW)  Comparison: 06/03/2011  Findings: Upper normal heart size. Stable mediastinal contours. Slight rotation to the left. Bibasilar atelectasis greater on left. No definite infiltrate or effusion. Cervicothoracic scoliosis.  Persistent dilatation of small bowel loops though some gas is present within the colon and rectum. Findings remain most consistent with small bowel obstruction. Ovoid calcifications right upper quadrant consistent with cholelithiasis. No definite free intraperitoneal air or bowel wall thickening. No acute osseous findings.  IMPRESSION: Persistent small bowel dilatation consistent with small bowel obstruction. Cause of bowel obstruction is uncertain; consider CT imaging with IV and potentially oral contrast to assess. No definite evidence of perforation or bowel wall thickening. Cholelithiasis. Bibasilar atelectasis greater on the left.  Original Report Authenticated By: Lollie Marrow, M.D.    Review of Systems  Unable to perform ROS: medical condition   Blood pressure 130/74, pulse 107, temperature 99.1 F (37.3 C), temperature source Axillary, resp. rate 15, height 5\' 8"  (1.727 m),  weight 72.576 kg (160 lb), SpO2 91.00%. Physical Exam  Constitutional: He is oriented to person, place, and time. He appears well-developed.       31 y/o with Cerebral Palsy, alert, and communicates mostly letter by letter with his uncle who just came to visit. He is no acute distress.  He told his uncle he refused NG tube while we in room talking about him.  HENT:  Head:  Normocephalic and atraumatic.  Eyes: Conjunctivae and EOM are normal. Pupils are equal, round, and reactive to light.  Neck: Normal range of motion. Neck supple. No JVD present. No tracheal deviation present. No thyromegaly present.  Cardiovascular: Regular rhythm, normal heart sounds and intact distal pulses.  Exam reveals no gallop.   No murmur heard.      Tachycardic with HR over 100  Respiratory: Effort normal and breath sounds normal. No stridor. No respiratory distress. He has no wheezes. He has no rales. He exhibits no tenderness.  GI: He exhibits distension. He exhibits no mass. There is tenderness. There is no rebound and no guarding.       Marked distension, tender and sore.  Hypoactive bowel sounds.  Well healed midline incision.  Genitourinary:       He has a condom catheter which has leaked and he wet all over.  He has some soft brown stool on the sheet and this is the second time today he's had some stool.  Musculoskeletal: He exhibits no edema and no tenderness.       He has cerebral palsy with lower extremity contracture and quadriplegia.  He is confined to the bed and his wheelchair.  Lymphadenopathy:    He has no cervical adenopathy.  Neurological: He is alert and oriented to person, place, and time. He displays abnormal reflex. He exhibits abnormal muscle tone. Coordination abnormal.       Has chronic issues with dysphagia  Skin: Skin is warm. No rash noted. No erythema.       No decubitus noted, skin is intact.  He has a bruit over the left groin, consistant with the possible AV fistula found on CT.    Psychiatric: He has a normal mood and affect.       He is very clear on what he wants and does not want with Uncle here to help with the communication.      Assessment/Plan: 1. Recurrent small bowel obstruction with a history of small bowel resection 2000 by Dr. Wiliam Ke. 2. Hospitalization small bowel obstruction 09/28/2008 3. Cerebral palsy with quadriplegia and contractions. Restricted to the bed and his wheelchair. 4. Hypertension 5. History of sinus tachycardia and right bundle branch block.. 6. Neurogenic bladder, recurrent UTIs, condom catheters. 7. History of DVT on chronic Coumadin with a therapeutic and rising INR. 8. Prior history of renal insufficiency. 9. History of postop ARDS,2000. 10. Hypokalemia being replaced. 11. 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 Nephrolithiasis and cholelithiasis.  Plan: Currently the patient is on bowel rest and IV hydration. His potassium is being replaced. He's refused NG drainage, which all films suggest would be useful. We talked about this with his uncle in the room. He was very adamantly against an NG. He is having some stool. At this point I would recommend holding his Coumadin and will repeat his 2 view abdominal films today. We will follow with you. Lilyrose Tanney 06/05/2011, 12:17 PM

## 2011-06-05 NOTE — Consult Note (Signed)
I have seen and examined the patient and agree with the assessment and plans. Patient needs an NG tube and may agree to it tomorrow.  Also recommend vascular surgery consult regarding groin finding on CT. PICC line, too.  Becket Wecker A. Magnus Ivan  MD, FACS

## 2011-06-05 NOTE — Progress Notes (Signed)
*  PRELIMINARY RESULTS* Vascular Ultrasound Left Groin Arterial duplex has been completed.  Preliminary findings:  Hypervascular lesion. Unable to fully classify by ultrasound or clearly identify origin, due to complexity of lesion. Approximating femoral vein and artery.   Farrel Demark, RDMS 06/05/2011, 10:11 AM

## 2011-06-05 NOTE — Progress Notes (Signed)
ANTICOAGULATION CONSULT NOTE - Follow Up Consult  Pharmacy Consult for coumadin Indication: atrial fibrillation  Allergies  Allergen Reactions  . Betadine (Povidone Iodine)   . Ciprofloxacin   . Morphine     Patient Measurements: Height: 5\' 8"  (172.7 cm) Weight: 160 lb (72.576 kg) IBW/kg (Calculated) : 68.4   Vital Signs: Temp: 99.1 F (37.3 C) (05/08 0440) Temp src: Axillary (05/08 0440) BP: 130/74 mmHg (05/08 0440) Pulse Rate: 107  (05/08 0440)  Labs:  Basename 06/05/11 0345 06/04/11 0355 06/03/11 1015 06/03/11 0940  HGB 13.4 14.6 -- --  HCT 40.7 42.8 -- 49.5  PLT 191 188 -- 221  APTT -- -- -- --  LABPROT 35.9* 35.4* 24.8* --  INR 3.53* 3.47* 2.20* --  HEPARINUNFRC -- -- -- --  CREATININE 0.82 1.03 1.44* --  CKTOTAL -- -- -- --  CKMB -- -- -- --  TROPONINI -- -- -- --    Estimated Creatinine Clearance: 126.3 ml/min (by C-G formula based on Cr of 0.82).   Medications:  Prescriptions prior to admission  Medication Sig Dispense Refill  . acetaminophen (TYLENOL) 325 MG tablet Take 650 mg by mouth every 6 (six) hours as needed. For pain       . baclofen (LIORESAL) 20 MG tablet Take 20 mg by mouth 4 (four) times daily.        . calcium carbonate (TUMS - DOSED IN MG ELEMENTAL CALCIUM) 500 MG chewable tablet Chew 1 tablet by mouth 3 (three) times daily.      . cholecalciferol (VITAMIN D) 1000 UNITS tablet Take 1,000 Units by mouth 2 (two) times daily.      . cyclobenzaprine (FLEXERIL) 10 MG tablet Take 10 mg by mouth 3 (three) times daily.       . diazepam (VALIUM) 2 MG tablet Take 2 mg by mouth every 12 (twelve) hours.      . Glycerin, Laxative, (SANI-SUPP ADULT RE) Place rectally as needed. For constipation      . hydrochlorothiazide (HYDRODIURIL) 25 MG tablet Take 25 mg by mouth daily.      Marland Kitchen loperamide (IMODIUM) 2 MG capsule Take 2 mg by mouth 3 (three) times daily as needed. For diarrhea      . metoprolol (TOPROL-XL) 100 MG 24 hr tablet Take 100 mg by mouth  daily.        . Multiple Vitamin (MULITIVITAMIN WITH MINERALS) TABS Take 1 tablet by mouth daily.      . nitrofurantoin (MACRODANTIN) 50 MG capsule Take 50 mg by mouth daily.      . sodium phosphate (FLEET) enema Place 1 enema rectally once. follow package directions       . warfarin (COUMADIN) 2 MG tablet Take 2 mg by mouth daily. Tuesday Thursday Saturday and Sunday ONLY      . warfarin (COUMADIN) 4 MG tablet Take 4 mg by mouth. Monday Wednesday and Friday       Scheduled:    . baclofen  20 mg Oral QID  . bisacodyl  10 mg Rectal BID  . diazepam  2.5 mg Intravenous Once  . diazepam  5 mg Intravenous Once  . diazepam  2 mg Oral Q12H  . hydrochlorothiazide  25 mg Oral Daily  . metoprolol succinate  100 mg Oral BID  . polyethylene glycol  17 g Oral Daily  . potassium chloride  10 mEq Intravenous Q1 Hr x 4  . sodium phosphate  1 enema Rectal Once  . Warfarin - Pharmacist Dosing Inpatient  Does not apply q1800  . DISCONTD: metoprolol succinate  100 mg Oral Daily    Assessment:  30yo M with cerebral palsy admitted with abd distention, constipation, N/V, and small amount of blood tinged emesis found to have SBO. Pharmacy consulted for Coumadin dosing.  Home dose is 4mg  MWF, 2mg  other days  Inpt doses: 4, 0 mg 5/6-7  INR is up and supratherapeutic. No further bleeding reported.   Goal of Therapy:  INR 2-3    Plan:  No coumadin today.  Daily INR  Gwen Her PharmD  [phone] 06/05/2011 10:23 AM

## 2011-06-05 NOTE — Progress Notes (Signed)
PROGRESS NOTE  Lynden Flemmer JXB:[id number] DOB: 1981-01-28 DOA: 06/03/2011 PCP: Florentina Jenny, MD, MD  Brief narrative: Mr. Gabler is a 31 year old male with a past medical history of cerebral palsy and chronic constipation who was brought to the hospital on 06/03/2011 with abdominal distention, abdominal pain, nausea and vomiting with a small amount of blood tinged emesis, who was found to have a small bowel obstruction on initial workup.  Assessment/Plan: Principal Problem:  *Small bowel obstruction  Admitted and placed on bowel rest.  Was given an enema on admission with good results.  Subsequent CT scans show a likely mechanical obstruction.  Refused NG tube last night. Active Problems:  CEREBRAL PALSY  Stable, at baseline.  Atrial fibrillation  Maintained on chronic Coumadin.  INR therapeutic.  Acute renal failure  Pre-renal in etiology.  Resolved with IV fluids.  Chronic constipation  Given an enema on admission with good results.  Hypertension  On metoprolol and hydrochlorothiazide.  Hypokalemia  Likely from GI losses.  Repleting intravenously.  Low TSH level  Check free T4.  Groin mass  Ultrasound ordered to further characterize.   Code Status: Full Family Communication: mother Nicole Cella,  cell phone [phone] Disposition Plan: Return to [facility] when stable.  Medical Consultants:  Dr. Magnus Ivan, Surgery  Other consultants:  Pharmacy  Antibiotics:  None   Subjective  Mr. Dorwart had some liquid stools yesterday according to the nursing staff. He has not had any vomiting overnight. He is difficult to understand but seems to be without distress.   Objective    Interim History: Stable overnight.   Objective: Filed Vitals:   06/04/11 0535 06/04/11 1329 06/04/11 2105 06/05/11 0440  BP: 136/78 118/79 117/76 130/74  Pulse: 117 109 110 107  Temp: 98.9 F (37.2 C) 97.9 F (36.6 C) 98.2 F (36.8 C) 99.1 F (37.3 C)  TempSrc: Axillary  Axillary Axillary Axillary  Resp: 18 18 14 15   Height:      Weight:      SpO2: 92% 90% 93% 91%    Intake/Output Summary (Last 24 hours) at 06/05/11 0826 Last data filed at 06/05/11 9629  Gross per 24 hour  Intake 2686.67 ml  Output    951 ml  Net 1735.67 ml    Exam: Gen:  NAD Cardiovascular:  Tachycardic, No M/R/G Respiratory: Lungs CTAB Gastrointestinal: Abdomen distended. Bowel sounds noted. No guarding or rebound. Extremities: No C/E/C    Data Reviewed: Basic Metabolic Panel:  Lab 06/05/11 5284 06/04/11 0355 06/03/11 1015  NA 139 143 138  K 3.2* 4.1 --  CL 103 103 96  CO2 25 27 25   GLUCOSE 87 126* 138*  BUN 16 23 30*  CREATININE 0.82 1.03 1.44*  CALCIUM 7.8* 8.4 10.3  MG -- -- 2.0  PHOS -- -- 5.4*   GFR Estimated Creatinine Clearance: 126.3 ml/min (by C-G formula based on Cr of 0.82). Liver Function Tests:  Lab 06/03/11 1015  AST 21  ALT 17  ALKPHOS 72  BILITOT 1.3*  PROT 7.8  ALBUMIN 4.4    Lab 06/03/11 1015  LIPASE 16  AMYLASE --   Coagulation profile  Lab 06/05/11 0345 06/04/11 0355 06/03/11 1015  INR 3.53* 3.47* 2.20*  PROTIME -- -- --    CBC:  Lab 06/05/11 0345 06/04/11 0355 06/03/11 0940  WBC 5.5 4.9 4.9  NEUTROABS -- -- 3.9  HGB 13.4 14.6 18.1*  HCT 40.7 42.8 49.5  MCV 91.1 90.3 87.3  PLT 191 188 221   Thyroid function studies  Basename 06/03/11 1015  TSH 0.225*  T4TOTAL --  T3FREE --  THYROIDAB --    Procedures and Diagnostic Studies:  Ct Abdomen Pelvis W Contrast 06/04/2011   IMPRESSION: Diffusely dilated small bowel ( measuring up to 5.5 cm in diameter) tracks up to an apparent transition zone just proximal to the ileocolic anastomoses.  Imaging features suggest mechanical obstruction, potentially secondary to adhesion. Small bowel stricture proximal to the anastomosis would also be a consideration.  Approximately 4 cm vascular "mass" in the left groin region.  Both the common femoral artery and vein track in/or through  this structure.  Given the lack of differential enhancement between the arterial and venous anatomy on this study, it is difficult to determine the etiology of this finding.  Differential considerations would include a pseudoaneurysm, AV fistula, or venous varix.  A focused ultrasound exam may prove helpful to further evaluate.  I personally discussed the results of this study with K. Schorr, mid-level for the hospitalist service, at 2019 hours on 06/04/2011.  Original Report Authenticated By: ERIC A. MANSELL, M.D.    Dg Abd Acute W/chest 06/04/2011  IMPRESSION: Persistent small bowel dilatation consistent with small bowel obstruction. Cause of bowel obstruction is uncertain; consider CT imaging with IV and potentially oral contrast to assess. No definite evidence of perforation or bowel wall thickening. Cholelithiasis. Bibasilar atelectasis greater on the left.  Original Report Authenticated By: Lollie Marrow, M.D.    Dg Abd Acute W/chest 06/03/2011 IMPRESSION: Bilateral basilar atelectasis or infiltrate.  Significant gaseous distended small bowel loops with air-fluid levels highly suspicious for small bowel obstruction.  Original Report Authenticated By: Natasha Mead, M.D.    Scheduled Meds:    . baclofen  20 mg Oral QID  . bisacodyl  10 mg Rectal BID  . diazepam  2.5 mg Intravenous Once  . diazepam  5 mg Intravenous Once  . diazepam  2 mg Oral Q12H  . hydrochlorothiazide  25 mg Oral Daily  . metoprolol succinate  100 mg Oral BID  . polyethylene glycol  17 g Oral Daily  . potassium chloride  10 mEq Intravenous Q1 Hr x 4  . sodium phosphate  1 enema Rectal Once  . sodium phosphate  1 enema Rectal Once  . Warfarin - Pharmacist Dosing Inpatient   Does not apply q1800  . DISCONTD: metoprolol succinate  100 mg Oral Daily   Continuous Infusions:    . 0.9 % NaCl with KCl 40 mEq / L    . DISCONTD: sodium chloride 100 mL/hr at 06/05/11 0516      LOS: 2 days   Hillery Aldo, MD Pager [phone]  06/05/2011, 8:26 AM

## 2011-06-06 ENCOUNTER — Inpatient Hospital Stay (HOSPITAL_COMMUNITY): Payer: 59

## 2011-06-06 ENCOUNTER — Inpatient Hospital Stay: Payer: 59

## 2011-06-06 DIAGNOSIS — R1033 Periumbilical pain: Secondary | ICD-10-CM

## 2011-06-06 DIAGNOSIS — K56609 Unspecified intestinal obstruction, unspecified as to partial versus complete obstruction: Secondary | ICD-10-CM

## 2011-06-06 DIAGNOSIS — R1900 Intra-abdominal and pelvic swelling, mass and lump, unspecified site: Secondary | ICD-10-CM

## 2011-06-06 DIAGNOSIS — M79609 Pain in unspecified limb: Secondary | ICD-10-CM

## 2011-06-06 DIAGNOSIS — G808 Other cerebral palsy: Secondary | ICD-10-CM

## 2011-06-06 LAB — BASIC METABOLIC PANEL
BUN: 10 mg/dL (ref 6–23)
CO2: 19 mEq/L (ref 19–32)
Chloride: 105 mEq/L (ref 96–112)
Creatinine, Ser: 0.6 mg/dL (ref 0.50–1.35)
GFR calc Af Amer: >90 mL/min (ref >90)
Glucose, Bld: 67 mg/dL — ABNORMAL LOW (ref 70–99)
Potassium: 3.8 mEq/L (ref 3.5–5.1)

## 2011-06-06 MED ORDER — KCL IN DEXTROSE-NACL 40-5-0.9 MEQ/L-%-% IV SOLN
INTRAVENOUS | Status: DC
  Administered 2011-06-06 – 2011-06-09 (×5): via INTRAVENOUS
  Filled 2011-06-06 (×8): qty 1000

## 2011-06-06 NOTE — Progress Notes (Signed)
I have seen and examined the patient and agree with the assessment and plans.  Dinesh Ulysse A. Josalynn Johndrow  MD, FACS  

## 2011-06-06 NOTE — Progress Notes (Signed)
ANTICOAGULATION CONSULT NOTE - Follow Up Consult  Pharmacy Consult for coumadin Indication: atrial fibrillation  Allergies  Allergen Reactions  . Betadine (Povidone Iodine)   . Ciprofloxacin   . Morphine     Patient Measurements: Height: 5\' 8"  (172.7 cm) Weight: 160 lb (72.576 kg) IBW/kg (Calculated) : 68.4   Vital Signs: Temp: 98.8 F (37.1 C) (05/09 0425) Temp src: Axillary (05/09 0425) BP: 124/81 mmHg (05/09 0425) Pulse Rate: 91  (05/09 0425)  Labs:  Basename 06/06/11 0335 06/05/11 0345 06/04/11 0355 06/03/11 0940  HGB -- 13.4 14.6 --  HCT -- 40.7 42.8 49.5  PLT -- 191 188 221  APTT -- -- -- --  LABPROT 38.4* 35.9* 35.4* --  INR 3.85* 3.53* 3.47* --  HEPARINUNFRC -- -- -- --  CREATININE 0.60 0.82 1.03 --  CKTOTAL -- -- -- --  CKMB -- -- -- --  TROPONINI -- -- -- --    Estimated Creatinine Clearance: 129.4 ml/min (by C-G formula based on Cr of 0.6).   Medications:  Prescriptions prior to admission  Medication Sig Dispense Refill  . acetaminophen (TYLENOL) 325 MG tablet Take 650 mg by mouth every 6 (six) hours as needed. For pain       . baclofen (LIORESAL) 20 MG tablet Take 20 mg by mouth 4 (four) times daily.        . calcium carbonate (TUMS - DOSED IN MG ELEMENTAL CALCIUM) 500 MG chewable tablet Chew 1 tablet by mouth 3 (three) times daily.      . cholecalciferol (VITAMIN D) 1000 UNITS tablet Take 1,000 Units by mouth 2 (two) times daily.      . cyclobenzaprine (FLEXERIL) 10 MG tablet Take 10 mg by mouth 3 (three) times daily.       . diazepam (VALIUM) 2 MG tablet Take 2 mg by mouth every 12 (twelve) hours.      . Glycerin, Laxative, (SANI-SUPP ADULT RE) Place rectally as needed. For constipation      . hydrochlorothiazide (HYDRODIURIL) 25 MG tablet Take 25 mg by mouth daily.      Marland Kitchen loperamide (IMODIUM) 2 MG capsule Take 2 mg by mouth 3 (three) times daily as needed. For diarrhea      . metoprolol (TOPROL-XL) 100 MG 24 hr tablet Take 100 mg by mouth daily.         . Multiple Vitamin (MULITIVITAMIN WITH MINERALS) TABS Take 1 tablet by mouth daily.      . nitrofurantoin (MACRODANTIN) 50 MG capsule Take 50 mg by mouth daily.      . sodium phosphate (FLEET) enema Place 1 enema rectally once. follow package directions       . warfarin (COUMADIN) 2 MG tablet Take 2 mg by mouth daily. Tuesday Thursday Saturday and Sunday ONLY      . warfarin (COUMADIN) 4 MG tablet Take 4 mg by mouth. Monday Wednesday and Friday       Scheduled:     . baclofen  20 mg Oral QID  . bisacodyl  10 mg Rectal BID  . diazepam  5 mg Intravenous Once  . diazepam  2 mg Oral Q12H  . hydrochlorothiazide  25 mg Oral Daily  . metoprolol succinate  100 mg Oral BID  . polyethylene glycol  17 g Oral Daily  . potassium chloride  10 mEq Intravenous Q1 Hr x 4  . Warfarin - Pharmacist Dosing Inpatient   Does not apply q1800    Assessment:  31yo M with cerebral palsy  admitted with abd distention, constipation, N/V, and small amount of blood tinged emesis found to have SBO. Pharmacy consulted for Coumadin dosing.  Home dose is 4mg  MWF, 2mg  other days  Inpt doses: 4, 0, 0 mg 5/6-8  INR is up and supratherapeutic. No further bleeding reported.   Goal of Therapy:  INR 2-3    Plan:  No coumadin today.  Daily INR  Gwen Her PharmD  [phone] 06/06/2011 9:30 AM

## 2011-06-06 NOTE — Procedures (Signed)
Successful RT DBL POWER PICC  Tip svc/ra No comp Stable

## 2011-06-06 NOTE — Consult Note (Signed)
[facility] CONSULT NOTE 06/06/2011 DOB: [birth year] MRN : [id number]  :History of Present Illness:  [name] is a 31 year old male with a past medical history of cerebral palsy and chronic constipation who was brought to the hospital on 06/03/2011 with abdominal distention, abdominal pain, nausea and vomiting with a small amount of blood tinged emesis, who was found to have a small bowel obstruction on initial workup. In the course of his evaluation, he was also found to have a vascular mass in the left groin, and is being transferred to [facility] for a vascular surgery evaluation to further characterize this mass. 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  He denies a history of arterial or venous cannulation in the groin.    Referring Physician: Maryruth Bun Rama, MD   CC:  left groin mass seen on CT scan.    In the left groin, there is a 3.7 x 2.7 cm irregular  vascular lesion which may be a pseudoaneurysm, AV fistula, or  venous varix. There is prominent collateralization in the  subcutaneous tissues of the lower left anterior abdominal wall. Patient has no complaints of pain or previous left groin problems.   Past Medical History  Diagnosis Date  . Hypertension   . Cerebral palsy   . Recurrent UTI   . Coagulopathy   . Insomnia   . Tachycardia   . Chronic constipation     Past Surgical History  Procedure Date  . Small intestine surgery   . Incise and drain abcess 09/11/10  . Elbow surgery 09/12/10    left     ROS: [x]  Positive  [ ]  Denies    General: [ ]  Weight loss, [ ]  Fever, [ ]  chills Neurologic: [ ]  Dizziness, [ ]  Blackouts, [ ]  Seizure [ ]  Stroke, [ ]  "Mini stroke", [ ]  Slurred speech, [ ]  Temporary blindness; [x ] weakness in arms or legs, [ ]  Hoarseness Cardiac: [ ]  Chest pain/pressure, [ ]  Shortness of breath at rest [ ]  Shortness of breath with exertion, [ ]  Atrial fibrillation or irregular heartbeat Vascular: [  ] Pain in legs with walking, [ ]  Pain in legs at rest, [ ]  Pain in legs at night,  [ ]  Non-healing ulcer, [ ]  Blood clot in vein/DVT,   Pulmonary: [ ]  Home oxygen, [ ]  Productive cough, [ ]  Coughing up blood, [ ]  Asthma,  [ ]  Wheezing Musculoskeletal:  [ ]  Arthritis, [ ]  Low back pain, [ ]  Joint pain Hematologic: [ ]  Easy Bruising, [ ]  Anemia; [ ]  Hepatitis Gastrointestinal: [ ]  Blood in stool, [ ]  Gastroesophageal Reflux/heartburn, [ ]  Trouble swallowing Urinary: [ ]  chronic Kidney disease, [ ]  on HD - [ ]  MWF or [ ]  TTHS, [ ]  Burning with urination, [x ] Difficulty urinating Skin: [ ]  Rashes, [ ]  Wounds Psychological: [ ]  Anxiety, [ ]  Depression  Social History History  Substance Use Topics  . Smoking status: Never Smoker   . Smokeless tobacco: Never Used  . Alcohol Use: No    Family History History reviewed. No pertinent family history.  Allergies  Allergen Reactions  . Betadine (Povidone Iodine)   . Ciprofloxacin   . Morphine     Current Facility-Administered Medications  Medication Dose Route Frequency Provider Last Rate Last Dose  . baclofen (LIORESAL) tablet 20 mg  20 mg Oral QID Dorothea Ogle, MD   20 mg at 06/04/11  2150  . bisacodyl (DULCOLAX) suppository 10 mg  10 mg Rectal BID Dorothea Ogle, MD   10 mg at 06/04/11 2151  . dextrose 5 % and 0.9 % NaCl with KCl 40 mEq/L infusion   Intravenous Continuous Maryruth Bun Rama, MD 100 mL/hr at 06/06/11 0940    . diazepam (VALIUM) injection 5 mg  5 mg Intravenous Once Leanne Chang, NP      . diazepam (VALIUM) injection 5 mg  5 mg Intravenous Q6H PRN Maryruth Bun Rama, MD   5 mg at 06/05/11 1655  . diazepam (VALIUM) tablet 2 mg  2 mg Oral Q12H Dorothea Ogle, MD   2 mg at 06/04/11 0918  . hydrochlorothiazide (HYDRODIURIL) tablet 25 mg  25 mg Oral Daily Dorothea Ogle, MD   25 mg at 06/04/11 0917  . HYDROmorphone (DILAUDID) injection 0.5 mg  0.5 mg Intravenous Q3H PRN Dorothea Ogle, MD   0.5 mg at 06/04/11 1736  . metoprolol  succinate (TOPROL-XL) 24 hr tablet 100 mg  100 mg Oral BID Dorothea Ogle, MD   100 mg at 06/04/11 2150  . ondansetron (ZOFRAN) injection 4 mg  4 mg Intravenous Q4H PRN Dorothea Ogle, MD   4 mg at 06/04/11 1736  . polyethylene glycol (MIRALAX / GLYCOLAX) packet 17 g  17 g Oral Daily Dorothea Ogle, MD   17 g at 06/04/11 0917  . potassium chloride 10 mEq in 100 mL IVPB  10 mEq Intravenous Q1 Hr x 4 Christina P Rama, MD   10 mEq at 06/05/11 1625  . Warfarin - Pharmacist Dosing Inpatient   Does not apply [id number] Dorothea Ogle, MD      . DISCONTD: 0.9 % NaCl with KCl 40 mEq / L  infusion   Intravenous Continuous Maryruth Bun Rama, MD 100 mL/hr at 06/06/11 4782       Imaging: Ct Abdomen Pelvis W Contrast  06/04/2011  *RADIOLOGY REPORT*  Clinical Data: Persistent small bowel obstruction with nausea vomiting.  CT ABDOMEN AND PELVIS WITH CONTRAST  Technique:  Multidetector CT imaging of the abdomen and pelvis was performed following the standard protocol during bolus administration of intravenous contrast.  Contrast: OMNIPAQUE IOHEXOL 300 MG/ML  SOLN  Comparison: 08/16/2009  Findings: Patchy airspace disease in the posterior left lower lobe is compatible with atelectasis or infiltrate.  No focal abnormalities seen in the liver or spleen.  The stomach, duodenum, pancreas, and adrenal glands are unremarkable.  Calcified stones are seen in the lumen of the gallbladder.  Tiny cortical cyst noted in the left kidney.  Right kidney is unremarkable.  There is diffuse fluid filled dilated small bowel measuring up to 5.5 cm in diameter. The the patient has had a right hemicolectomy and ileocolic anastomoses is seen in the right upper quadrant of the abdomen.  Unfortunately, the patient moved while images were being obtained through this region and the motion artifact degrades the image quality this region.  Nevertheless, there does appear to be a transition zone from a markedly dilated loop anterior to the liver to a  completely collapsed loop leading into the entero-colic anastomosis.  There is some fluid and gas in the colon diffusely down to the level of the rectum.  The superior mesenteric artery and vein are patent.  Portal vein is patent.  No abdominal aortic aneurysm.  The SMA and IMA are patent.  Imaging through the pelvis shows no substantial intraperitoneal free fluid.  In the  left groin, there is a 3.7 x 2.7 cm irregular vascular lesion which may be a pseudoaneurysm, AV fistula, or venous varix.  There is prominent collateralization in the subcutaneous tissues of the lower left anterior abdominal wall.  Bone windows reveal no worrisome lytic or sclerotic osseous lesions.  IMPRESSION: Diffusely dilated small bowel ( measuring up to 5.5 cm in diameter) tracks up to an apparent transition zone just proximal to the ileocolic anastomoses.  Imaging features suggest mechanical obstruction, potentially secondary to adhesion. Small bowel stricture proximal to the anastomosis would also be a consideration.  Approximately 4 cm vascular "mass" in the left groin region.  Both the common femoral artery and vein track in/or through this structure.  Given the lack of differential enhancement between the arterial and venous anatomy on this study, it is difficult to determine the etiology of this finding.  Differential considerations would include a pseudoaneurysm, AV fistula, or venous varix.  A focused ultrasound exam may prove helpful to further evaluate.  I personally discussed the results of this study with K. Schorr, mid-level for the hospitalist service, at 2019 hours on 06/04/2011.  Original Report Authenticated By: ERIC A. MANSELL, M.D.   Ir Fluoro Guide Cv Line Right  06/06/2011  *RADIOLOGY REPORT*  Clinical Data: Infection, access for IV antibiotics  PICC LINE PLACEMENT WITH ULTRASOUND AND FLUOROSCOPIC  GUIDANCE  Fluoroscopy Time: 0.4 minutes.  The right arm was prepped with chlorhexidine, draped in the usual sterile  fashion using maximum barrier technique (cap and mask, sterile gown, sterile gloves, large sterile sheet, hand hygiene and cutaneous antisepsis) and infiltrated locally with 1% Lidocaine.  Ultrasound demonstrated patency of the right brachial vein, and this was documented with an image.  Under real-time ultrasound guidance, this vein was accessed with a 21 gauge micropuncture needle and image documentation was performed.  The needle was exchanged over a guidewire for a peel-away sheath through which a 5 [place] double lumen PICC trimmed to 39 cm was advanced, positioned with its tip at the lower SVC/right atrial junction.  Fluoroscopy during the procedure and fluoro spot radiograph confirms appropriate catheter position.  The catheter was flushed, secured to the skin with Prolene sutures, and covered with a sterile dressing.  Complications:  No immediate  IMPRESSION: Successful right arm PICC line placement with ultrasound and fluoroscopic guidance.  The catheter is ready for use.  Original Report Authenticated By: Judie Petit. Ruel Favors, M.D.   Ir US Guide Vasc Access Right  06/06/2011  *RADIOLOGY REPORT*  Clinical Data: Infection, access for IV antibiotics  PICC LINE PLACEMENT WITH ULTRASOUND AND FLUOROSCOPIC  GUIDANCE  Fluoroscopy Time: 0.4 minutes.  The right arm was prepped with chlorhexidine, draped in the usual sterile fashion using maximum barrier technique (cap and mask, sterile gown, sterile gloves, large sterile sheet, hand hygiene and cutaneous antisepsis) and infiltrated locally with 1% Lidocaine.  Ultrasound demonstrated patency of the right brachial vein, and this was documented with an image.  Under real-time ultrasound guidance, this vein was accessed with a 21 gauge micropuncture needle and image documentation was performed.  The needle was exchanged over a guidewire for a peel-away sheath through which a 5 [place] double lumen PICC trimmed to 39 cm was advanced, positioned with its tip at the lower  SVC/right atrial junction.  Fluoroscopy during the procedure and fluoro spot radiograph confirms appropriate catheter position.  The catheter was flushed, secured to the skin with Prolene sutures, and covered with a sterile dressing.  Complications:  No immediate  IMPRESSION: Successful right arm PICC line placement with ultrasound and fluoroscopic guidance.  The catheter is ready for use.  Original Report Authenticated By: Judie Petit. Ruel Favors, M.D.   Dg Abd Portable 2v  06/05/2011  *RADIOLOGY REPORT*  Clinical Data: Small bowel obstruction  PORTABLE ABDOMEN - 2 VIEW  Comparison: 06/04/2011  Findings: No free air on the left lateral decubitus radiograph. There are dilated small bowel loops in the mid abdomen with fluid levels on the decubitus radiograph.  The colon appears nondilated. Regional bones unremarkable.  Left pelvic phleboliths.  IMPRESSION:  1.  Persistent mid to distal small bowel obstruction. 2.  No free air.  Original Report Authenticated By: Osa Craver, M.D.    Significant Diagnostic Studies: CBC Lab Results  Component Value Date   WBC 5.5 06/05/2011   HGB 13.4 06/05/2011   HCT 40.7 06/05/2011   MCV 91.1 06/05/2011   PLT 191 06/05/2011    BMET    Component Value Date/Time   NA 138 06/06/2011 0335   K 3.8 06/06/2011 0335   CL 105 06/06/2011 0335   CO2 19 06/06/2011 0335   GLUCOSE 67* 06/06/2011 0335   BUN 10 06/06/2011 0335   CREATININE 0.60 06/06/2011 0335   CALCIUM 7.6* 06/06/2011 0335   GFRNONAA >90 06/06/2011 0335   GFRAA >90 06/06/2011 0335    COAG Lab Results  Component Value Date   INR 3.85* 06/06/2011   INR 3.53* 06/05/2011   INR 3.47* 06/04/2011   No results found for this basename: PTT     Physical Examination  Patient Vitals for the past 24 hrs:  BP Temp Temp src Pulse Resp SpO2  06/06/11 1527 125/83 mmHg 98.4 F (36.9 C) Axillary 114  19  100 %  06/06/11 1438 130/80 mmHg 98.6 F (37 C) Axillary 90  18  98 %  06/06/11 0425 124/81 mmHg 98.8 F (37.1 C) Axillary 91  14   98 %  06/05/11 2020 120/78 mmHg 98.8 F (37.1 C) Axillary 89  17  96 %   Pulse Readings from Last 3 Encounters:  06/06/11 114  02/16/11 91  11/15/10 90    General:  WDWN  Gait:non ambulatory HENT: WNL Eyes: Pupils equal Pulmonary: normal non-labored breathing , without Rales, rhonchi,  wheezing Cardiac: RRR, without  Murmurs, rubs or gallops; No carotid bruits Abdomen: soft, NT, no masses well healed scar central abdonmin Skin: no rashes, ulcers noted Vascular Exam/Pulses:Palpable femoral, popliteal, DP and PT bilaterally.  No left groin pulsatile masses noted.  Patient is non tender to palpation.  No skin lesion or edema in the left groin area.  Extremities without ischemic changes, no Gangrene , no cellulitis; no open wounds; Bilateral hands are cool to touch and show discoloration of cyanosis. Musculoskeletal: no muscle wasting or atrophy  Neurologic: A&O X 3; Appropriate Affect ;  SENSATION: normal; MOTOR FUNCTION: non independent movement of upper and lower extremities Speech is non fluent and he has an interpreter from the home he resides in as a resident.   ASSESSMENT: Left groin mass seen on CT- non visible from exterior skin and no visible pulsatile masses noted.  PLAN:  Based on imaging to date, the etiology of this mass is unclear.  The patient denies any history of left groin catheterization.  This could be either traumatic or congenital.  To further evaluate this, he needs either an angiogram or CTA.  Since he is on coumadin for a history of DVT, I will start with a  CTA, to be done in the morning.  I will follow up after the exam has been completed.  I discussed this with the patient as well as his mother and father at the bedside

## 2011-06-06 NOTE — Progress Notes (Signed)
2010 Pt perineum and between buttocks red.  EPC and barrier creams applied.  Pt refused q2 turns.

## 2011-06-06 NOTE — Progress Notes (Signed)
PROGRESS NOTE  [name] ZOX:[id number] DOB: 25-Feb-1980 DOA: 06/03/2011 PCP: Florentina Jenny, MD, MD  Brief narrative: [name] is a 31 year old male with a past medical history of cerebral palsy and chronic constipation who was brought to the hospital on 06/03/2011 with abdominal distention, abdominal pain, nausea and vomiting with a small amount of blood tinged emesis, who was found to have a small bowel obstruction on initial workup. In the course of his evaluation, he was also found to have a vascular mass in the left groin, and is being transferred to [facility] for a vascular surgery evaluation to further characterize this mass.  Assessment/Plan: Principal Problem:  *Small bowel obstruction  Admitted and placed on bowel rest.  Was given an enema on admission with good results.  Subsequent CT scans show a likely mechanical obstruction.  Refused NG tube placement.  Appears to be resolving clinically. Active Problems:  CEREBRAL PALSY  Stable, at baseline.  Atrial fibrillation  Maintained on chronic Coumadin, now on hold per surgery.  INR therapeutic.  Acute renal failure  Pre-renal in etiology.  Resolved with IV fluids.  Chronic constipation  Given an enema on admission with good results.  Hypertension  On metoprolol and hydrochlorothiazide.  Hypokalemia  Likely from GI losses.  Repleting intravenously.  Low TSH level  Check free T4 (pending).  Groin mass  Ultrasound non-diagnostic.  Vascular consult requested, spoke with Dr. Myra Gianotti.  Transfer patient to [facility] for further evaluation.  Hypoglycemia  Likely from n.p.o. status.   Will place dextrose in IV fluids.  Code Status: Full Family Communication: mother [name],  Updated by cell phone [phone]) 06/06/11 and advised of transfer to [facility]. Disposition Plan: Return to [facility], when stable.  Medical Consultants:  Dr. Abigail Miyamoto, Surgery  Dr. Durene Cal, Vascular  Surgery  Other consultants:  Pharmacy  Antibiotics:  None   Subjective  [name] has 2 bowel movements overnight according to the nurses. He also had multiple bowel movements yesterday. No nausea or vomiting overnight and the patient continues to refuse NG tube placement.   Objective    Interim History: Stable overnight.   Objective: Filed Vitals:   06/05/11 0440 06/05/11 1414 06/05/11 2020 06/06/11 0425  BP: 130/74 130/86 120/78 124/81  Pulse: 107 103 89 91  Temp: 99.1 F (37.3 C) 98.1 F (36.7 C) 98.8 F (37.1 C) 98.8 F (37.1 C)  TempSrc: Axillary Axillary Axillary Axillary  Resp: 15 16 17 14   Height:      Weight:      SpO2: 91% 97% 96% 98%    Intake/Output Summary (Last 24 hours) at 06/06/11 5621 Last data filed at 06/05/11 1800  Gross per 24 hour  Intake 1301.67 ml  Output      0 ml  Net 1301.67 ml    Exam: Gen:  NAD Cardiovascular:  Tachycardic, No M/R/G Respiratory: Lungs CTAB Gastrointestinal: Abdomen much less distended today, soft. Bowel sounds noted. No guarding or rebound. Extremities: No C/E/C    Data Reviewed: Basic Metabolic Panel:  Lab 06/06/11 3086 06/05/11 0345 06/04/11 0355 06/03/11 1015  NA 138 139 143 138  K 3.8 3.2* -- --  CL 105 103 103 96  CO2 19 25 27 25   GLUCOSE 67* 87 126* 138*  BUN 10 16 23  30*  CREATININE 0.60 0.82 1.03 1.44*  CALCIUM 7.6* 7.8* 8.4 10.3  MG -- -- -- 2.0  PHOS -- -- -- 5.4*   GFR Estimated Creatinine Clearance: 129.4 ml/min (by C-G  formula based on Cr of 0.6). Liver Function Tests:  Lab 06/03/11 1015  AST 21  ALT 17  ALKPHOS 72  BILITOT 1.3*  PROT 7.8  ALBUMIN 4.4    Lab 06/03/11 1015  LIPASE 16  AMYLASE --   Coagulation profile  Lab 06/06/11 0335 06/05/11 0345 06/04/11 0355 06/03/11 1015  INR 3.85* 3.53* 3.47* 2.20*  PROTIME -- -- -- --    CBC:  Lab 06/05/11 0345 06/04/11 0355 06/03/11 0940  WBC 5.5 4.9 4.9  NEUTROABS -- -- 3.9  HGB 13.4 14.6 18.1*  HCT 40.7 42.8 49.5    MCV 91.1 90.3 87.3  PLT 191 188 221   Thyroid function studies  Basename 06/03/11 1015  TSH 0.225*  T4TOTAL --  T3FREE --  THYROIDAB --    Procedures and Diagnostic Studies:  Ct Abdomen Pelvis W Contrast 06/04/2011   IMPRESSION: Diffusely dilated small bowel ( measuring up to 5.5 cm in diameter) tracks up to an apparent transition zone just proximal to the ileocolic anastomoses.  Imaging features suggest mechanical obstruction, potentially secondary to adhesion. Small bowel stricture proximal to the anastomosis would also be a consideration.  Approximately 4 cm vascular "mass" in the left groin region.  Both the common femoral artery and vein track in/or through this structure.  Given the lack of differential enhancement between the arterial and venous anatomy on this study, it is difficult to determine the etiology of this finding.  Differential considerations would include a pseudoaneurysm, AV fistula, or venous varix.  A focused ultrasound exam may prove helpful to further evaluate.  I personally discussed the results of this study with [name], mid-level for the hospitalist service, at 2019 hours on 06/04/2011.  Original Report Authenticated By: ERIC A. MANSELL, M.D.    Dg Abd Acute W/chest 06/04/2011  IMPRESSION: Persistent small bowel dilatation consistent with small bowel obstruction. Cause of bowel obstruction is uncertain; consider CT imaging with IV and potentially oral contrast to assess. No definite evidence of perforation or bowel wall thickening. Cholelithiasis. Bibasilar atelectasis greater on the left.  Original Report Authenticated By: Lollie Marrow, M.D.    Dg Abd Acute W/chest 06/03/2011 IMPRESSION: Bilateral basilar atelectasis or infiltrate.  Significant gaseous distended small bowel loops with air-fluid levels highly suspicious for small bowel obstruction.  Original Report Authenticated By: Natasha Mead, M.D.     Dg Abd Portable 2v 06/05/2011  IMPRESSION:  1.  Persistent mid to  distal small bowel obstruction. 2.  No free air.  Original Report Authenticated By: Thora Lance III, M.D.    Scheduled Meds:    . baclofen  20 mg Oral QID  . bisacodyl  10 mg Rectal BID  . diazepam  5 mg Intravenous Once  . diazepam  2 mg Oral Q12H  . hydrochlorothiazide  25 mg Oral Daily  . metoprolol succinate  100 mg Oral BID  . polyethylene glycol  17 g Oral Daily  . potassium chloride  10 mEq Intravenous Q1 Hr x 4  . Warfarin - Pharmacist Dosing Inpatient   Does not apply q1800   Continuous Infusions:    . 0.9 % NaCl with KCl 40 mEq / L 100 mL/hr at 06/06/11 0437      LOS: 3 days   Hillery Aldo, MD Pager [phone]  06/06/2011, 8:33 AM

## 2011-06-06 NOTE — Progress Notes (Signed)
Subjective: Appears to feel better asking about something to drink.  Objective: Vital signs in last 24 hours: Temp:  [98.1 F (36.7 C)-98.8 F (37.1 C)] 98.8 F (37.1 C) (05/09 0425) Pulse Rate:  [89-103] 91  (05/09 0425) Resp:  [14-17] 14  (05/09 0425) BP: (120-130)/(78-86) 124/81 mmHg (05/09 0425) SpO2:  [96 %-98 %] 98 % (05/09 0425) Last BM Date: 06/05/11  Vomited 200 ml, 1 BM recorded, care taker says he had several  Intake/Output from previous day: 05/08 0701 - 05/09 0700 In: 1301.7 [I.V.:901.7; IV Piggyback:400] Out: -  Intake/Output this shift:    General appearance: alert, cooperative and no distress GI: soft, less distended than yesterday, but still has some distension. BS hyperactive  No NG at this point.  Lab Results:   Basename 06/05/11 0345 06/04/11 0355  WBC 5.5 4.9  HGB 13.4 14.6  HCT 40.7 42.8  PLT 191 188    BMET  Basename 06/06/11 0335 06/05/11 0345  NA 138 139  K 3.8 3.2*  CL 105 103  CO2 19 25  GLUCOSE 67* 87  BUN 10 16  CREATININE 0.60 0.82  CALCIUM 7.6* 7.8*   PT/INR  Basename 06/06/11 0335 06/05/11 0345  LABPROT 38.4* 35.9*  INR 3.85* 3.53*     Lab 06/03/11 1015  AST 21  ALT 17  ALKPHOS 72  BILITOT 1.3*  PROT 7.8  ALBUMIN 4.4     Lipase     Component Value Date/Time   LIPASE 16 06/03/2011 1015     Studies/Results: Ct Abdomen Pelvis W Contrast  06/04/2011  *RADIOLOGY REPORT*  Clinical Data: Persistent small bowel obstruction with nausea vomiting.  CT ABDOMEN AND PELVIS WITH CONTRAST  Technique:  Multidetector CT imaging of the abdomen and pelvis was performed following the standard protocol during bolus administration of intravenous contrast.  Contrast: OMNIPAQUE IOHEXOL 300 MG/ML  SOLN  Comparison: 08/16/2009  Findings: Patchy airspace disease in the posterior left lower lobe is compatible with atelectasis or infiltrate.  No focal abnormalities seen in the liver or spleen.  The stomach, duodenum, pancreas, and  adrenal glands are unremarkable.  Calcified stones are seen in the lumen of the gallbladder.  Tiny cortical cyst noted in the left kidney.  Right kidney is unremarkable.  There is diffuse fluid filled dilated small bowel measuring up to 5.5 cm in diameter. The the patient has had a right hemicolectomy and ileocolic anastomoses is seen in the right upper quadrant of the abdomen.  Unfortunately, the patient moved while images were being obtained through this region and the motion artifact degrades the image quality this region.  Nevertheless, there does appear to be a transition zone from a markedly dilated loop anterior to the liver to a completely collapsed loop leading into the entero-colic anastomosis.  There is some fluid and gas in the colon diffusely down to the level of the rectum.  The superior mesenteric artery and vein are patent.  Portal vein is patent.  No abdominal aortic aneurysm.  The SMA and IMA are patent.  Imaging through the pelvis shows no substantial intraperitoneal free fluid.  In the left groin, there is a 3.7 x 2.7 cm irregular vascular lesion which may be a pseudoaneurysm, AV fistula, or venous varix.  There is prominent collateralization in the subcutaneous tissues of the lower left anterior abdominal wall.  Bone windows reveal no worrisome lytic or sclerotic osseous lesions.  IMPRESSION: Diffusely dilated small bowel ( measuring up to 5.5 cm in diameter) tracks up  to an apparent transition zone just proximal to the ileocolic anastomoses.  Imaging features suggest mechanical obstruction, potentially secondary to adhesion. Small bowel stricture proximal to the anastomosis would also be a consideration.  Approximately 4 cm vascular "mass" in the left groin region.  Both the common femoral artery and vein track in/or through this structure.  Given the lack of differential enhancement between the arterial and venous anatomy on this study, it is difficult to determine the etiology of this  finding.  Differential considerations would include a pseudoaneurysm, AV fistula, or venous varix.  A focused ultrasound exam may prove helpful to further evaluate.  I personally discussed the results of this study with K. Schorr, mid-level for the hospitalist service, at 2019 hours on 06/04/2011.  Original Report Authenticated By: ERIC A. MANSELL, M.D.   Ir Fluoro Guide Cv Line Right  06/06/2011  *RADIOLOGY REPORT*  Clinical Data: Infection, access for IV antibiotics  PICC LINE PLACEMENT WITH ULTRASOUND AND FLUOROSCOPIC  GUIDANCE  Fluoroscopy Time: 0.4 minutes.  The right arm was prepped with chlorhexidine, draped in the usual sterile fashion using maximum barrier technique (cap and mask, sterile gown, sterile gloves, large sterile sheet, hand hygiene and cutaneous antisepsis) and infiltrated locally with 1% Lidocaine.  Ultrasound demonstrated patency of the right brachial vein, and this was documented with an image.  Under real-time ultrasound guidance, this vein was accessed with a 21 gauge micropuncture needle and image documentation was performed.  The needle was exchanged over a guidewire for a peel-away sheath through which a 5 [place] double lumen PICC trimmed to 39 cm was advanced, positioned with its tip at the lower SVC/right atrial junction.  Fluoroscopy during the procedure and fluoro spot radiograph confirms appropriate catheter position.  The catheter was flushed, secured to the skin with Prolene sutures, and covered with a sterile dressing.  Complications:  No immediate  IMPRESSION: Successful right arm PICC line placement with ultrasound and fluoroscopic guidance.  The catheter is ready for use.  Original Report Authenticated By: Judie Petit. Ruel Favors, M.D.   Ir US Guide Vasc Access Right  06/06/2011  *RADIOLOGY REPORT*  Clinical Data: Infection, access for IV antibiotics  PICC LINE PLACEMENT WITH ULTRASOUND AND FLUOROSCOPIC  GUIDANCE  Fluoroscopy Time: 0.4 minutes.  The right arm was prepped with  chlorhexidine, draped in the usual sterile fashion using maximum barrier technique (cap and mask, sterile gown, sterile gloves, large sterile sheet, hand hygiene and cutaneous antisepsis) and infiltrated locally with 1% Lidocaine.  Ultrasound demonstrated patency of the right brachial vein, and this was documented with an image.  Under real-time ultrasound guidance, this vein was accessed with a 21 gauge micropuncture needle and image documentation was performed.  The needle was exchanged over a guidewire for a peel-away sheath through which a 5 [place] double lumen PICC trimmed to 39 cm was advanced, positioned with its tip at the lower SVC/right atrial junction.  Fluoroscopy during the procedure and fluoro spot radiograph confirms appropriate catheter position.  The catheter was flushed, secured to the skin with Prolene sutures, and covered with a sterile dressing.  Complications:  No immediate  IMPRESSION: Successful right arm PICC line placement with ultrasound and fluoroscopic guidance.  The catheter is ready for use.  Original Report Authenticated By: Judie Petit. Ruel Favors, M.D.   Dg Abd Acute W/chest  06/04/2011  *RADIOLOGY REPORT*  Clinical Data: Small bowel obstruction, abdominal pain, follow-up  ACUTE ABDOMEN SERIES (ABDOMEN 2 VIEW & CHEST 1 VIEW)  Comparison: 06/03/2011  Findings: Upper  normal heart size. Stable mediastinal contours. Slight rotation to the left. Bibasilar atelectasis greater on left. No definite infiltrate or effusion. Cervicothoracic scoliosis.  Persistent dilatation of small bowel loops though some gas is present within the colon and rectum. Findings remain most consistent with small bowel obstruction. Ovoid calcifications right upper quadrant consistent with cholelithiasis. No definite free intraperitoneal air or bowel wall thickening. No acute osseous findings.  IMPRESSION: Persistent small bowel dilatation consistent with small bowel obstruction. Cause of bowel obstruction is uncertain;  consider CT imaging with IV and potentially oral contrast to assess. No definite evidence of perforation or bowel wall thickening. Cholelithiasis. Bibasilar atelectasis greater on the left.  Original Report Authenticated By: Lollie Marrow, M.D.   Dg Abd Portable 2v  06/05/2011  *RADIOLOGY REPORT*  Clinical Data: Small bowel obstruction  PORTABLE ABDOMEN - 2 VIEW  Comparison: 06/04/2011  Findings: No free air on the left lateral decubitus radiograph. There are dilated small bowel loops in the mid abdomen with fluid levels on the decubitus radiograph.  The colon appears nondilated. Regional bones unremarkable.  Left pelvic phleboliths.  IMPRESSION:  1.  Persistent mid to distal small bowel obstruction. 2.  No free air.  Original Report Authenticated By: Osa Craver, M.D.    Medications:    . baclofen  20 mg Oral QID  . bisacodyl  10 mg Rectal BID  . diazepam  5 mg Intravenous Once  . diazepam  2 mg Oral Q12H  . hydrochlorothiazide  25 mg Oral Daily  . metoprolol succinate  100 mg Oral BID  . polyethylene glycol  17 g Oral Daily  . potassium chloride  10 mEq Intravenous Q1 Hr x 4  . Warfarin - Pharmacist Dosing Inpatient   Does not apply q1800    Assessment/Plan 1. Recurrent small bowel obstruction with a history of small bowel resection 2000 by Dr. Wiliam Ke.  2. Hospitalization small bowel obstruction 09/28/2008  3. Cerebral palsy with quadriplegia and contractions. Restricted to the bed and his wheelchair.  4. Hypertension  5. History of sinus tachycardia and right bundle branch block..  6. Neurogenic bladder, recurrent UTIs, condom catheters.  7. History of DVT on chronic Coumadin with a therapeutic and rising INR.  8. Prior history of renal insufficiency.  9. History of postop ARDS,2000.  10. Hypokalemia being replaced.  11. 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 Nephrolithiasis and cholelithiasis.   Plan:  He's having bowel movements yesterday, apparently refused NG again.   Hopefully he will open up.  If he goes to [facility] i will ask our team to follow there.    LOS: 3 days    [name] 06/06/2011

## 2011-06-07 ENCOUNTER — Inpatient Hospital Stay (HOSPITAL_COMMUNITY): Payer: 59

## 2011-06-07 DIAGNOSIS — G808 Other cerebral palsy: Secondary | ICD-10-CM

## 2011-06-07 DIAGNOSIS — R1900 Intra-abdominal and pelvic swelling, mass and lump, unspecified site: Secondary | ICD-10-CM

## 2011-06-07 DIAGNOSIS — K56609 Unspecified intestinal obstruction, unspecified as to partial versus complete obstruction: Secondary | ICD-10-CM

## 2011-06-07 DIAGNOSIS — R1033 Periumbilical pain: Secondary | ICD-10-CM

## 2011-06-07 MED ORDER — IOHEXOL 350 MG/ML SOLN: 200.0000 mL | Freq: Once | INTRAVENOUS | Status: AC | PRN

## 2011-06-07 MED ORDER — SODIUM CHLORIDE 0.9 % IJ SOLN
10.0000 mL | INTRAMUSCULAR | Status: DC | PRN
  Administered 2011-06-07 – 2011-06-10 (×8): 10 mL

## 2011-06-07 MED ORDER — DIAZEPAM 5 MG/ML IJ SOLN
INTRAMUSCULAR | Status: AC
  Filled 2011-06-07: qty 2

## 2011-06-07 MED ORDER — DIAZEPAM 5 MG/ML IJ SOLN
2.5000 mg | Freq: Once | INTRAMUSCULAR | Status: AC
  Administered 2011-06-07: 2.5 mg via INTRAVENOUS

## 2011-06-07 NOTE — Progress Notes (Signed)
CTA done today shows:  IMPRESSION:  1. 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 No change in appearance since 09/28/2008. Probably  congenital.  2. 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  3. Cholelithiasis.  4. Interval decrease in small bowel dilatation. There is  significant circumferential wall thickening in distal small bowel  loops.  Dr. Myra Gianotti to assess

## 2011-06-07 NOTE — Progress Notes (Signed)
ANTICOAGULATION CONSULT NOTE - Follow Up Consult  Pharmacy Consult for coumadin Indication: h/o DVT  Allergies  Allergen Reactions  . Betadine (Povidone Iodine)   . Ciprofloxacin   . Morphine     Patient Measurements: Height: 5\' 8"  (172.7 cm) Weight: 180 lb (81.647 kg) IBW/kg (Calculated) : 68.4   Vital Signs: Temp: 98.8 F (37.1 C) (05/10 0611) Temp src: Oral (05/10 0611) BP: 126/84 mmHg (05/10 0611) Pulse Rate: 99  (05/10 0611)  Labs:  Basename 06/07/11 0805 06/06/11 0335 06/05/11 0345  HGB -- -- 13.4  HCT -- -- 40.7  PLT -- -- 191  APTT -- -- --  LABPROT 38.4* 38.4* 35.9*  INR 3.85* 3.85* 3.53*  HEPARINUNFRC -- -- --  CREATININE -- 0.60 0.82  CKTOTAL -- -- --  CKMB -- -- --  TROPONINI -- -- --    Estimated Creatinine Clearance: 129.4 ml/min (by C-G formula based on Cr of 0.6).   Medications:  Scheduled:     . baclofen  20 mg Oral QID  . bisacodyl  10 mg Rectal BID  . diazepam  5 mg Intravenous Once  . diazepam  2 mg Oral Q12H  . hydrochlorothiazide  25 mg Oral Daily  . metoprolol succinate  100 mg Oral BID  . polyethylene glycol  17 g Oral Daily  . Warfarin - Pharmacist Dosing Inpatient   Does not apply q1800    Assessment: 31yo M tx from [facility] on 5/9 with cerebral palsy admitted with abd distention, constipation, N/V, and small amount of blood tinged emesis found to have SBO. CT revealed L groin mass, VVS has seen patient and plan is to perform CTA to further delineate etiology of mass (possibly, pseudoaneursym, AV fistula, venous varix).  Pharmacy consulted for Coumadin dosing for h/o LLE DVT.  INR remains supratherapeutic this am at 3.85, last dose of coumadin 4mg  on 5/6. No bleeding documented.  Home dose is 4mg  MWF, 2mg  other days  Goal of Therapy:  INR 2-3  Plan:  No coumadin today.  Daily INR  Juliette Alcide, PharmD, BCPS.  Pager: [phone] 06/07/2011 8:44 AM

## 2011-06-07 NOTE — Progress Notes (Signed)
INITIAL ADULT NUTRITION ASSESSMENT Date: 06/07/2011   Time: 10:45 AM Reason for Assessment: Low Braden Score  ASSESSMENT: Male 31 y.o.  Dx: Small bowel obstruction  Hx:  Past Medical History  Diagnosis Date  . Hypertension   . Cerebral palsy   . Recurrent UTI   . Coagulopathy   . Insomnia   . Tachycardia   . Chronic constipation     Related Meds:     . baclofen  20 mg Oral QID  . bisacodyl  10 mg Rectal BID  . diazepam  5 mg Intravenous Once  . diazepam  2 mg Oral Q12H  . hydrochlorothiazide  25 mg Oral Daily  . metoprolol succinate  100 mg Oral BID  . polyethylene glycol  17 g Oral Daily  . Warfarin - Pharmacist Dosing Inpatient   Does not apply q1800     Ht: 5\' 8"  (172.7 cm)  Wt: 180 lb (81.647 kg)  Ideal Wt: 70 kg  % Ideal Wt: 117%  Usual Wt:  Wt Readings from Last 5 Encounters:  06/06/11 180 lb (81.647 kg)  02/16/11 150 lb (68.04 kg)  11/01/10 159 lb (72.122 kg)  10/16/10 159 lb 4 oz (72.235 kg)  10/31/06 150 lb (68.04 kg)    % Usual Wt: 100%  Body mass index is 27.37 kg/(m^2). Pt is overweight  Food/Nutrition Related Hx: Pt unable to provide hx. Per nutrition risk assessment, no problems  Labs:  CMP     Component Value Date/Time   NA 138 06/06/2011 0335   K 3.8 06/06/2011 0335   CL 105 06/06/2011 0335   CO2 19 06/06/2011 0335   GLUCOSE 67* 06/06/2011 0335   BUN 10 06/06/2011 0335   CREATININE 0.60 06/06/2011 0335   CALCIUM 7.6* 06/06/2011 0335   PROT 7.8 06/03/2011 1015   ALBUMIN 4.4 06/03/2011 1015   AST 21 06/03/2011 1015   ALT 17 06/03/2011 1015   ALKPHOS 72 06/03/2011 1015   BILITOT 1.3* 06/03/2011 1015   GFRNONAA >90 06/06/2011 0335   GFRAA >90 06/06/2011 0335    Intake/Output Summary (Last 24 hours) at 06/07/11 1048 Last data filed at 06/07/11 1610  Gross per 24 hour  Intake   2055 ml  Output      0 ml  Net   2055 ml     Diet Order: NPO  Supplements/Tube Feeding: none  IVF:    dextrose 5 % and 0.9 % NaCl with KCl 40 mEq/L Last Rate: 100 mL/hr  at 06/07/11 0613    Estimated Nutritional Needs:   Kcal: 1600-1800  Protein: 75-85 gm  Fluid:  1.6 - 1.8 L  Pt is NPO (4 days) for SBO, refusing NGT. Pt has a hx of obstruction and chronic constipation as well as CP. Pt was not able to provide any information other then he is hungry. Pt is a resident of a home, a previous care giver from that home was with pt. She stated he has a good appetite. Weight is 20-30 lbs above UBW from weight hx, could be from retention. No pressure ulcers at this time, though noted pt with some redness to perineum and buttocks. Pt is at increased risk for skin breakdown with NPO status.   NUTRITION DIAGNOSIS: -Inadequate oral intake (NI-2.1).  Status: Ongoing  RELATED TO: Inability to eat  AS EVIDENCE BY: NPO diet orders  MONITORING/EVALUATION(Goals): Goal: diet will be advanced or if anticipated to remain NPO > 7 days start nutrition support Monitor: diet advance, weight, labs, I/O's  EDUCATION NEEDS: -No education needs identified at this time  INTERVENTION: 1. No nutrition interventions at this time 2. Once diet is advanced will assess for supplement needs  Dietitian [phone]  DOCUMENTATION CODES Per approved criteria  -Not Applicable    Clarene Duke MARIE 06/07/2011, 10:45 AM

## 2011-06-07 NOTE — Care Management Note (Signed)
    Page 1 of 1   06/10/2011     8:40:24 PM   CARE MANAGEMENT NOTE 06/10/2011  Patient:  [name], [name]   Account Number:  [id number]  Date Initiated:  06/07/2011  Documentation initiated by:  Letha Cape  Subjective/Objective Assessment:   dx sbo, cerebral palsy, mild MR, vascular mass at femoral artery.  admit- From ALF [facility]- Group Home     Action/Plan:   Vascular and Surgery consulted   Anticipated DC Date:  06/10/2011   Anticipated DC Plan:  GROUP HOME  In-house referral  Clinical Social Worker      DC Planning Services  CM consult      Choice offered to / List presented to:             Status of service:  Completed, signed off Medicare Important Message given?   (If response is "NO", the following Medicare IM given date fields will be blank) Date Medicare IM given:   Date Additional Medicare IM given:    Discharge Disposition:  GROUP HOME  Per UR Regulation:  Reviewed for med. necessity/level of care/duration of stay  If discussed at Long Length of Stay Meetings, dates discussed:    Comments:  06/10/11 20:39 Letha Cape RN,  BSN [phone] patient for dc to group home today, CSW following.  06/07/11 15:55 Letha Cape RN, BSN [phone] patient is from [facility], [facility]F.  Vascular and Surgery consulted.  CSW referral for ALF.

## 2011-06-07 NOTE — Progress Notes (Addendum)
CSW met with pt at bedside along with pt personal care giver/sitter. Pt plans to return to [facility] when medically stable. .Clinical social worker continuing to follow pt to assist with pt dc plans and further csw needs. Pt is unable to return to [facility] over the weekend. CSW submitted for pt pasarr Number.   Catha Gosselin, Theresia Majors  [phone] .06/07/2011 1708pm

## 2011-06-07 NOTE — Progress Notes (Signed)
PATIENT DETAILS Name: [name] Age: 31 y.o. Sex: male Date of Birth: 1980/02/16 Admit Date: 06/03/2011 AVW:[id number], Sherilyn Cooter, MD, MD  Subjective: Claims to have passed numerous stools yesterday  Objective: Vital signs in last 24 hours: Filed Vitals:   06/07/11 0611 06/07/11 0745 06/07/11 1312 06/07/11 1336  BP: 126/84 123/89 127/84   Pulse: 99 92 89   Temp: 98.8 F (37.1 C) 97.8 F (36.6 C)  97.5 F (36.4 C)  TempSrc: Oral Axillary  Axillary  Resp: 18 16  17   Height:      Weight:      SpO2: 96% 95%  100%    Weight change:   Body mass index is 27.37 kg/(m^2).  Intake/Output from previous day:  Intake/Output Summary (Last 24 hours) at 06/07/11 1414 Last data filed at 06/07/11 9147  Gross per 24 hour  Intake   2055 ml  Output      0 ml  Net   2055 ml    PHYSICAL EXAM: Gen Exam: Awake, speech at baseline Neck: Supple, No JVD.   Chest: B/L Clear.   CVS: S1 S2 Regular, no murmurs.  Abdomen: soft, BS +, non tender, non distended.  Extremities: no edema, lower extremities warm to touch. Neurologic: Non Focal.   Skin: No Rash.   Wounds: N/A.    CONSULTS:  general surgery and vascular surgery  LAB RESULTS: CBC  Lab 06/05/11 0345 06/04/11 0355 06/03/11 0940  WBC 5.5 4.9 4.9  HGB 13.4 14.6 18.1*  HCT 40.7 42.8 49.5  PLT 191 188 221  MCV 91.1 90.3 87.3  MCH 30.0 30.0 31.9  MCHC 32.9 33.2 36.6*  RDW 13.8 13.7 13.2  LYMPHSABS -- -- 0.2*  MONOABS -- -- 0.8  EOSABS -- -- 0.0  BASOSABS -- -- 0.0  BANDABS -- -- --    Chemistries   Lab 06/06/11 0335 06/05/11 0345 06/04/11 0355 06/03/11 1015  NA 138 139 143 138  K 3.8 3.2* 4.1 4.0  CL 105 103 103 96  CO2 19 25 27 25   GLUCOSE 67* 87 126* 138*  BUN 10 16 23  30*  CREATININE 0.60 0.82 1.03 1.44*  CALCIUM 7.6* 7.8* 8.4 10.3  MG -- -- -- 2.0    GFR Estimated Creatinine Clearance: 129.4 ml/min (by C-G formula based on Cr of 0.6).  Coagulation profile  Lab 06/07/11 0805 06/06/11 0335 06/05/11 0345  06/04/11 0355 06/03/11 1015  INR 3.85* 3.85* 3.53* 3.47* 2.20*  PROTIME -- -- -- -- --    Cardiac Enzymes No results found for this basename: CK:3,CKMB:3,TROPONINI:3,MYOGLOBIN:3 in the last 168 hours  No components found with this basename: POCBNP:3 No results found for this basename: DDIMER:2 in the last 72 hours No results found for this basename: HGBA1C:2 in the last 72 hours No results found for this basename: CHOL:2,HDL:2,LDLCALC:2,TRIG:2,CHOLHDL:2,LDLDIRECT:2 in the last 72 hours No results found for this basename: TSH,T4TOTAL,FREET3,T3FREE,THYROIDAB in the last 72 hours No results found for this basename: VITAMINB12:2,FOLATE:2,FERRITIN:2,TIBC:2,IRON:2,RETICCTPCT:2 in the last 72 hours No results found for this basename: LIPASE:2,AMYLASE:2 in the last 72 hours  Urine Studies No results found for this basename: UACOL:2,UAPR:2,USPG:2,UPH:2,UTP:2,UGL:2,UKET:2,UBIL:2,UHGB:2,UNIT:2,UROB:2,ULEU:2,UEPI:2,UWBC:2,URBC:2,UBAC:2,CAST:2,CRYS:2,UCOM:2,BILUA:2 in the last 72 hours  MICROBIOLOGY: No results found for this or any previous visit (from the past 240 hour(s)).  RADIOLOGY STUDIES/RESULTS: Ct Abdomen Pelvis W Contrast  06/04/2011  *RADIOLOGY REPORT*  Clinical Data: Persistent small bowel obstruction with nausea vomiting.  CT ABDOMEN AND PELVIS WITH CONTRAST  Technique:  Multidetector CT imaging of the abdomen and pelvis was performed following the  standard protocol during bolus administration of intravenous contrast.  Contrast: OMNIPAQUE IOHEXOL 300 MG/ML  SOLN  Comparison: 08/16/2009  Findings: Patchy airspace disease in the posterior left lower lobe is compatible with atelectasis or infiltrate.  No focal abnormalities seen in the liver or spleen.  The stomach, duodenum, pancreas, and adrenal glands are unremarkable.  Calcified stones are seen in the lumen of the gallbladder.  Tiny cortical cyst noted in the left kidney.  Right kidney is unremarkable.  There is diffuse fluid filled  dilated small bowel measuring up to 5.5 cm in diameter. The the patient has had a right hemicolectomy and ileocolic anastomoses is seen in the right upper quadrant of the abdomen.  Unfortunately, the patient moved while images were being obtained through this region and the motion artifact degrades the image quality this region.  Nevertheless, there does appear to be a transition zone from a markedly dilated loop anterior to the liver to a completely collapsed loop leading into the entero-colic anastomosis.  There is some fluid and gas in the colon diffusely down to the level of the rectum.  The superior mesenteric artery and vein are patent.  Portal vein is patent.  No abdominal aortic aneurysm.  The SMA and IMA are patent.  Imaging through the pelvis shows no substantial intraperitoneal free fluid.  In the left groin, there is a 3.7 x 2.7 cm irregular vascular lesion which may be a pseudoaneurysm, AV fistula, or venous varix.  There is prominent collateralization in the subcutaneous tissues of the lower left anterior abdominal wall.  Bone windows reveal no worrisome lytic or sclerotic osseous lesions.  IMPRESSION: Diffusely dilated small bowel ( measuring up to 5.5 cm in diameter) tracks up to an apparent transition zone just proximal to the ileocolic anastomoses.  Imaging features suggest mechanical obstruction, potentially secondary to adhesion. Small bowel stricture proximal to the anastomosis would also be a consideration.  Approximately 4 cm vascular "mass" in the left groin region.  Both the common femoral artery and vein track in/or through this structure.  Given the lack of differential enhancement between the arterial and venous anatomy on this study, it is difficult to determine the etiology of this finding.  Differential considerations would include a pseudoaneurysm, AV fistula, or venous varix.  A focused ultrasound exam may prove helpful to further evaluate.  I personally discussed the results of this  study with K. Schorr, mid-level for the hospitalist service, at 2019 hours on 06/04/2011.  Original Report Authenticated By: ERIC A. MANSELL, M.D.   Ct Angio Ao+bifem W/cm &/or Wo/cm  06/07/2011  *RADIOLOGY REPORT*  Clinical data: Left groin mass.  CT ANGIOGRAM ABDOMINAL AORTA AND BILATERAL LOWER EXTREMITIES WITH CONTRAST  Technique:  Axial helical CT of the abdominal aorta and bilateral lower extremities after Optiray 350 IV.  Coronal and sagittal reconstructions were generated for vascular evaluation.  Comparison:  06/04/2011  Arterial findings: Aorta:                  Normal in caliber and contour without significant atheromatous irregularity, dissection, aneurysm, or stenosis.  Celiac axis:            Unremarkable  Superior mesenteric:Unremarkable, with classic distal branching anatomy.  Left renal:             Single, widely patent.  Right renal:            Single, patent proximally.  The distal aspect is degraded by patient motion.  Inferior mesenteric:Unremarkable.  Left iliac:             Mildly ectatic throughout, without focal lesion.  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 This has not significantly changed in size or appearance since scans dating back to 09/28/2008. The common femoral artery is mildly dilated.  Distal SFA and profunda femoris branches unremarkable.  Patent popliteal artery. There is significant venous opacification distally limiting assessment of trifurcation runoff vessels.  Right lower extremity: Common femoral, profunda femoris, superficial femoral, and popliteal arteries unremarkable.  There is significant venous opacification distally, limiting assessment of trifurcation runoff vessels.  Venous findings:  There is marked dilatation of the  left common femoral vein at the level of the AV  malformation.  There is distention of the collaterals draining veins extending into the pelvis and in the anterior body wall. No evidence of mesenteric venous involvement. 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 Right iliac venous system, IVC, bilateral renal veins, portal vein, and visualized portions of the hepatic veins are patent.  Review of the MIP images confirms the above findings.  Nonvascular findings: Visualized portions of lung bases unremarkable.  Unremarkable visualized portions of the liver and spleen, adrenal glands, kidneys, pancreas.  At least two partially calcified stones layer in the dependent aspect of the gallbladder, largest measuring 1 cm in maximal transverse diameter.  No ascites. No free air.  Stomach and proximal small bowel decompressed.  There is an anastomotic staple line in the right lower quadrant with circumferential wall thickening and mild distention of the associated small bowel loops, which has progressed since the previous study.  The small bowel dilatation seen previously has resolved.  The colon is nondilated.  Urinary bladder incompletely distended.  Mild prostatic enlargement.  No adenopathy localized.  IMPRESSION:  1.  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 No change in appearance since 09/28/2008.  Probably congenital. 2.  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 3.  Cholelithiasis. 4.  Interval decrease in small bowel dilatation. There is significant circumferential wall thickening in distal small bowel loops.  Original Report  Authenticated By: Osa Craver, M.D.   Ir Fluoro Guide Cv Line Right  06/06/2011  *RADIOLOGY REPORT*  Clinical Data: Infection, access for IV antibiotics  PICC LINE PLACEMENT WITH ULTRASOUND AND FLUOROSCOPIC  GUIDANCE  Fluoroscopy Time: 0.4 minutes.  The right arm was prepped with chlorhexidine, draped in the usual sterile fashion using maximum barrier technique (cap and mask, sterile gown, sterile gloves, large sterile sheet, hand hygiene and cutaneous antisepsis) and infiltrated locally with 1% Lidocaine.  Ultrasound demonstrated patency of the right brachial vein, and this was documented with an image.  Under real-time ultrasound guidance, this vein was accessed with a 21 gauge micropuncture needle and image documentation was performed.  The needle was exchanged over a guidewire for a peel-away sheath through which a 5 [place] double lumen PICC trimmed to 39 cm was advanced, positioned  with its tip at the lower SVC/right atrial junction.  Fluoroscopy during the procedure and fluoro spot radiograph confirms appropriate catheter position.  The catheter was flushed, secured to the skin with Prolene sutures, and covered with a sterile dressing.  Complications:  No immediate  IMPRESSION: Successful right arm PICC line placement with ultrasound and fluoroscopic guidance.  The catheter is ready for use.  Original Report Authenticated By: Judie Petit. Ruel Favors, M.D.   Ir US Guide Vasc Access Right  06/06/2011  *RADIOLOGY REPORT*  Clinical Data: Infection, access for IV antibiotics  PICC LINE PLACEMENT WITH ULTRASOUND AND FLUOROSCOPIC  GUIDANCE  Fluoroscopy Time: 0.4 minutes.  The right arm was prepped with chlorhexidine, draped in the usual sterile fashion using maximum barrier technique (cap and mask, sterile gown, sterile gloves, large sterile sheet, hand hygiene and cutaneous antisepsis) and infiltrated locally with 1% Lidocaine.  Ultrasound demonstrated patency of the right brachial vein, and this was  documented with an image.  Under real-time ultrasound guidance, this vein was accessed with a 21 gauge micropuncture needle and image documentation was performed.  The needle was exchanged over a guidewire for a peel-away sheath through which a 5 [place] double lumen PICC trimmed to 39 cm was advanced, positioned with its tip at the lower SVC/right atrial junction.  Fluoroscopy during the procedure and fluoro spot radiograph confirms appropriate catheter position.  The catheter was flushed, secured to the skin with Prolene sutures, and covered with a sterile dressing.  Complications:  No immediate  IMPRESSION: Successful right arm PICC line placement with ultrasound and fluoroscopic guidance.  The catheter is ready for use.  Original Report Authenticated By: Judie Petit. Ruel Favors, M.D.   Dg Abd Acute W/chest  06/04/2011  *RADIOLOGY REPORT*  Clinical Data: Small bowel obstruction, abdominal pain, follow-up  ACUTE ABDOMEN SERIES (ABDOMEN 2 VIEW & CHEST 1 VIEW)  Comparison: 06/03/2011  Findings: Upper normal heart size. Stable mediastinal contours. Slight rotation to the left. Bibasilar atelectasis greater on left. No definite infiltrate or effusion. Cervicothoracic scoliosis.  Persistent dilatation of small bowel loops though some gas is present within the colon and rectum. Findings remain most consistent with small bowel obstruction. Ovoid calcifications right upper quadrant consistent with cholelithiasis. No definite free intraperitoneal air or bowel wall thickening. No acute osseous findings.  IMPRESSION: Persistent small bowel dilatation consistent with small bowel obstruction. Cause of bowel obstruction is uncertain; consider CT imaging with IV and potentially oral contrast to assess. No definite evidence of perforation or bowel wall thickening. Cholelithiasis. Bibasilar atelectasis greater on the left.  Original Report Authenticated By: Lollie Marrow, M.D.   Dg Abd Acute W/chest  06/03/2011  *RADIOLOGY REPORT*   Clinical Data: Abdominal pain, vomiting  ACUTE ABDOMEN SERIES (ABDOMEN 2 VIEW & CHEST 1 VIEW)  Comparison: 08/27/2009  Findings: The cardiomediastinal silhouette is stable.  No convincing pulmonary edema. Bilateral basilar atelectasis or infiltrate.  Significant gaseous distended small bowel loops with multiple air- fluid levels best seen on decubitus view highly suspicious for small bowel obstruction.  No free abdominal air.  IMPRESSION: Bilateral basilar atelectasis or infiltrate.  Significant gaseous distended small bowel loops with air-fluid levels highly suspicious for small bowel obstruction.  Original Report Authenticated By: Natasha Mead, M.D.   Dg Abd Portable 2v  06/05/2011  *RADIOLOGY REPORT*  Clinical Data: Small bowel obstruction  PORTABLE ABDOMEN - 2 VIEW  Comparison: 06/04/2011  Findings: No free air on the left lateral decubitus radiograph. There are dilated small bowel loops in the mid  abdomen with fluid levels on the decubitus radiograph.  The colon appears nondilated. Regional bones unremarkable.  Left pelvic phleboliths.  IMPRESSION:  1.  Persistent mid to distal small bowel obstruction. 2.  No free air.  Original Report Authenticated By: Osa Craver, M.D.    MEDICATIONS: Scheduled Meds:   . baclofen  20 mg Oral QID  . bisacodyl  10 mg Rectal BID  . diazepam      . diazepam  2.5 mg Intravenous Once  . diazepam  5 mg Intravenous Once  . diazepam  2 mg Oral Q12H  . hydrochlorothiazide  25 mg Oral Daily  . metoprolol succinate  100 mg Oral BID  . polyethylene glycol  17 g Oral Daily  . Warfarin - Pharmacist Dosing Inpatient   Does not apply q1800   Continuous Infusions:   . 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 06/07/2011, 2:14 PM

## 2011-06-08 ENCOUNTER — Inpatient Hospital Stay (HOSPITAL_COMMUNITY): Payer: 59

## 2011-06-08 DIAGNOSIS — G808 Other cerebral palsy: Secondary | ICD-10-CM

## 2011-06-08 DIAGNOSIS — K56609 Unspecified intestinal obstruction, unspecified as to partial versus complete obstruction: Secondary | ICD-10-CM

## 2011-06-08 DIAGNOSIS — R1900 Intra-abdominal and pelvic swelling, mass and lump, unspecified site: Secondary | ICD-10-CM

## 2011-06-08 DIAGNOSIS — R1033 Periumbilical pain: Secondary | ICD-10-CM

## 2011-06-08 LAB — CBC
HCT: 39.1 % (ref 39.0–52.0)
MCH: 29.9 pg (ref 26.0–34.0)
MCHC: 34.5 g/dL (ref 30.0–36.0)
MCV: 86.7 fL (ref 78.0–100.0)
RDW: 13 % (ref 11.5–15.5)

## 2011-06-08 LAB — BASIC METABOLIC PANEL
Chloride: 105 mEq/L (ref 96–112)
Creatinine, Ser: 0.52 mg/dL (ref 0.50–1.35)
GFR calc Af Amer: >90 mL/min (ref >90)
Potassium: 3.6 mEq/L (ref 3.5–5.1)
Sodium: 139 mEq/L (ref 135–145)

## 2011-06-08 LAB — PROTIME-INR
INR: 3.11 — ABNORMAL HIGH (ref 0.00–1.49)
Prothrombin Time: 32.5 seconds — ABNORMAL HIGH (ref 11.6–15.2)

## 2011-06-08 LAB — DIFFERENTIAL
Basophils Absolute: 0 10*3/uL (ref 0.0–0.1)
Basophils Relative: 1 % (ref 0–1)
Eosinophils Relative: 6 % — ABNORMAL HIGH (ref 0–5)
Monocytes Absolute: 0.6 10*3/uL (ref 0.1–1.0)

## 2011-06-08 NOTE — Progress Notes (Signed)
1300 Patient had very large formed stool

## 2011-06-08 NOTE — Progress Notes (Signed)
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  It is unchanged from scan several years ago Would not recommend any treatment at this time unless he Becomes symptomatic  Elizette Shek IV, V. WELLS 06/08/2011 5:57 AM --  Ceasar Mons Vitals:   06/07/11 2118  BP: 124/89  Pulse: 87  Temp: 97.7 F (36.5 C)  Resp: 18    Intake/Output Summary (Last 24 hours) at 06/08/11 0557 Last data filed at 06/08/11 0139  Gross per 24 hour  Intake 3104.17 ml  Output    975 ml  Net 2129.17 ml     Laboratory CBC    Component Value Date/Time   WBC 5.5 06/05/2011 0345   HGB 13.4 06/05/2011 0345   HCT 40.7 06/05/2011 0345   PLT 191 06/05/2011 0345    BMET    Component Value Date/Time   NA 138 06/06/2011 0335   K 3.8 06/06/2011 0335   CL 105 06/06/2011 0335   CO2 19 06/06/2011 0335   GLUCOSE 67* 06/06/2011 0335   BUN 10 06/06/2011 0335   CREATININE 0.60 06/06/2011 0335   CALCIUM 7.6* 06/06/2011 0335   GFRNONAA >90 06/06/2011 0335   GFRAA >90 06/06/2011 0335    COAG Lab Results  Component Value Date   INR 3.85* 06/07/2011   INR 3.85* 06/06/2011   INR 3.53* 06/05/2011   No results found for this basename: PTT    Antibiotics Anti-infectives    None       V. Charlena Cross, M.D. [facility] Office: [phone] Pager:  [phone]

## 2011-06-08 NOTE — Progress Notes (Signed)
Pt refuses to take his dulcolax even after nurse explained to pt the importance of taking the medication.

## 2011-06-08 NOTE — Progress Notes (Signed)
[name] [id number] Mar 15, 1980  CARE TEAM:  PCP: Florentina Jenny, MD, MD  Outpatient Care Team: Patient Care Team: Florentina Jenny as PCP - General (Family Medicine)  Inpatient Treatment Team: Treatment Team: Attending Provider: Maretta Bees, MD; Technician: Herma Ard; Registered Nurse: Barrie Lyme, RN; Consulting Physician: Bishop Limbo, MD; Technician: Lemar Livings, [place]; Consulting Physician: Nada Libman, MD; Registered Nurse: Julieanne Manson Fleurizard, RN; Technician: Maricela Curet, NT; Registered Nurse: Hardie Pulley, RN; Rounding Team: Simon Rhein, MD; Technician: Venetia Maxon, NT; Registered Nurse: Toya Smothers, RN  Subjective:  At [facility] No events Denies pain No N/V  Objective:  Vital signs:  Filed Vitals:   06/07/11 1312 06/07/11 1336 06/07/11 2118 06/08/11 0652  BP: 127/84  124/89 114/82  Pulse: 89  87 79  Temp:  97.5 F (36.4 C) 97.7 F (36.5 C) 97.5 F (36.4 C)  TempSrc:  Axillary Axillary Oral  Resp:  17 18 18   Height:      Weight:      SpO2:  100% 96% 100%    Last BM Date: 06/06/11  Intake/Output   Yesterday:  05/10 0701 - 05/11 0700 In: 1611.7 [I.V.:1611.7] Out: 1275 [Urine:1275] This shift:     Bowel function:  Flatus: yes  BM: He claims "Yea" but not documented  Physical Exam:  General: Pt awake/alert/oriented x4 in no acute distress Eyes: PERRL, normal EOM.  Sclera clear.  No icterus Neuro: CN II-XII intact.  Sensory intact.   Basic answers to questions given speech difficulty with severe CP Lymph: No head/neck/groin lymphadenopathy Psych:  No delerium/psychosis/paranoia HENT: Mucus membranes moist.  No thrush Neck: Supple, No tracheal deviation Chest: No chest wall pain w good excursion CV:  Pulses intact.  Regular rhythm Abdomen: Soft.  Flat.  Nondistended.  Nontender.  No incarcerated hernias. Ext:  SCDs BLE.  No mjr edema.  No cyanosis.  Moderate contractures Skin: No petechiae  / purpurae  Results:   Labs: Results for orders placed during the hospital encounter of 06/03/11 (from the past 48 hour(s))  PROTIME-INR     Status: Abnormal   Collection Time   06/07/11  8:05 AM      Component Value Range Comment   Prothrombin Time 38.4 (*) 11.6 - 15.2 (seconds)    INR 3.85 (*) 0.00 - 1.49    PROTIME-INR     Status: Abnormal   Collection Time   06/08/11  5:05 AM      Component Value Range Comment   Prothrombin Time 32.5 (*) 11.6 - 15.2 (seconds)    INR 3.11 (*) 0.00 - 1.49    BASIC METABOLIC PANEL     Status: Abnormal   Collection Time   06/08/11  5:05 AM      Component Value Range Comment   Sodium 139  135 - 145 (mEq/L)    Potassium 3.6  3.5 - 5.1 (mEq/L)    Chloride 105  96 - 112 (mEq/L)    CO2 25  19 - 32 (mEq/L)    Glucose, Bld 102 (*) 70 - 99 (mg/dL)    BUN <3 (*) 6 - 23 (mg/dL)    Creatinine, Ser 1.32  0.50 - 1.35 (mg/dL)    Calcium 8.4  8.4 - 10.5 (mg/dL)    GFR calc non Af Amer >90  >90 (mL/min)    GFR calc Af Amer >90  >90 (mL/min)     Imaging / Studies: Ct Angio Ao+bifem W/cm &/or Wo/cm  06/07/2011  *  RADIOLOGY REPORT*  Clinical data: Left groin mass.  CT ANGIOGRAM ABDOMINAL AORTA AND BILATERAL LOWER EXTREMITIES WITH CONTRAST  Technique:  Axial helical CT of the abdominal aorta and bilateral lower extremities after Optiray 350 IV.  Coronal and sagittal reconstructions were generated for vascular evaluation.  Comparison:  06/04/2011  Arterial findings: Aorta:                  Normal in caliber and contour without significant atheromatous irregularity, dissection, aneurysm, or stenosis.  Celiac axis:            Unremarkable  Superior mesenteric:Unremarkable, with classic distal branching anatomy.  Left renal:             Single, widely patent.  Right renal:            Single, patent proximally.  The distal aspect is degraded by patient motion.  Inferior mesenteric:Unremarkable.  Left iliac:             Mildly ectatic throughout, without focal lesion.   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 This has not significantly changed in size or appearance since scans dating back to 09/28/2008. The common femoral artery is mildly dilated.  Distal SFA and profunda femoris branches unremarkable.  Patent popliteal artery. There is significant venous opacification distally limiting assessment of trifurcation runoff vessels.  Right lower extremity: Common femoral, profunda femoris, superficial femoral, and popliteal arteries unremarkable.  There is significant venous opacification distally, limiting assessment of trifurcation runoff vessels.  Venous findings:  There is marked dilatation of the  left common femoral vein at the level of the AV malformation.  There is distention of the collaterals draining veins extending into the pelvis and in the anterior body wall. No evidence of mesenteric venous involvement. 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 Right iliac venous system, IVC, bilateral renal veins, portal vein, and visualized portions of the hepatic veins are patent.  Review of the MIP images confirms the above findings.  Nonvascular findings: Visualized portions of lung bases unremarkable.  Unremarkable visualized portions of the liver and spleen, adrenal glands, kidneys, pancreas.  At least two partially calcified stones layer in the dependent aspect of the gallbladder, largest measuring 1 cm in maximal transverse diameter.  No ascites. No free air.  Stomach and proximal small bowel decompressed.  There is an anastomotic staple line in the right lower quadrant with circumferential wall thickening and mild distention of the associated small bowel loops,  which has progressed since the previous study.  The small bowel dilatation seen previously has resolved.  The colon is nondilated.  Urinary bladder incompletely distended.  Mild prostatic enlargement.  No adenopathy localized.  IMPRESSION:  1.  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 No change in appearance since 09/28/2008.  Probably congenital. 2.  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 3.  Cholelithiasis. 4.  Interval decrease in small bowel dilatation. There is  significant circumferential wall thickening in distal small bowel loops.  Original Report Authenticated By: Osa Craver, M.D.   Ir Fluoro Guide Cv Line Right  06/06/2011  *RADIOLOGY REPORT*  Clinical Data: Infection, access for IV antibiotics  PICC LINE PLACEMENT WITH ULTRASOUND AND FLUOROSCOPIC  GUIDANCE  Fluoroscopy Time: 0.4 minutes.  The right arm was prepped with chlorhexidine, draped in the usual sterile fashion using maximum barrier technique (cap and mask, sterile gown, sterile gloves, large sterile sheet, hand hygiene and cutaneous antisepsis) and infiltrated locally with 1% Lidocaine.  Ultrasound demonstrated patency of the right brachial vein, and this was documented with an image.  Under real-time ultrasound guidance, this vein was accessed with a 21 gauge micropuncture needle and image documentation was performed.  The needle was exchanged over a guidewire for a peel-away sheath through which a 5 [place] double lumen PICC trimmed to 39 cm was advanced, positioned with its tip at the lower SVC/right atrial junction.  Fluoroscopy during the procedure and fluoro spot radiograph confirms appropriate catheter position.  The catheter was  flushed, secured to the skin with Prolene sutures, and covered with a sterile dressing.  Complications:  No immediate  IMPRESSION: Successful right arm PICC line placement with ultrasound and fluoroscopic guidance.  The catheter is ready for use.  Original Report Authenticated By: Judie Petit. Ruel Favors, M.D.   Ir US Guide Vasc Access Right  06/06/2011  *RADIOLOGY REPORT*  Clinical Data: Infection, access for IV antibiotics  PICC LINE PLACEMENT WITH ULTRASOUND AND FLUOROSCOPIC  GUIDANCE  Fluoroscopy Time: 0.4 minutes.  The right arm was prepped with chlorhexidine, draped in the usual sterile fashion using maximum barrier technique (cap and mask, sterile gown, sterile gloves, large sterile sheet, hand hygiene and cutaneous antisepsis) and infiltrated locally with 1% Lidocaine.  Ultrasound demonstrated patency of the right brachial vein, and this was documented with an image.  Under real-time ultrasound guidance, this vein was accessed with a 21 gauge micropuncture needle and image documentation was performed.  The needle was exchanged over a guidewire for a peel-away sheath through which a 5 [place] double lumen PICC trimmed to 39 cm was advanced, positioned with its tip at the lower SVC/right atrial junction.  Fluoroscopy during the procedure and fluoro spot radiograph confirms appropriate catheter position.  The catheter was flushed, secured to the skin with Prolene sutures, and covered with a sterile dressing.  Complications:  No immediate  IMPRESSION: Successful right arm PICC line placement with ultrasound and fluoroscopic guidance.  The catheter is ready for use.  Original Report Authenticated By: Judie Petit. Ruel Favors, M.D.    Medications / Allergies: per chart  Antibiotics: Anti-infectives    None      Problem List:  Principal Problem:  *Small bowel obstruction Active Problems:  CEREBRAL PALSY  Atrial fibrillation  Acute renal failure  Chronic constipation  Hypertension  Hypokalemia  Low TSH level   Groin mass   Assessment  [name]  31 y.o. male       PSBO resolving with contrast in left colon.  Challenge to follow clinically with patient not wanting NGT/surgery & probable desire to underplay PSBO to avoid surgery  Plan: -strict I&O -adv diet slowly -bowel regimen, more rectally at first - try enema if no BM (CT full of stool) -prox colon mildly thickened on CT but no pneumatosis.  Nontender, no fever, not obstructive - follow clinically -incidental GS - no biliary colic -VTE prophylaxis- SCDs, etc -mobilize as tolerated to help recovery  D/w Int Med primary service  Ardeth Sportsman, M.D., F.A.C.S. Gastrointestinal and Minimally Invasive Surgery [facility] [address], [address] [place], [place] [address] [phone] Main / Paging [phone] Voice Mail   06/08/2011

## 2011-06-08 NOTE — Progress Notes (Signed)
PATIENT DETAILS Name: [name] Age: 31 y.o. Sex: male Date of Birth: 04-Jan-1981 Admit Date: 06/03/2011 WUJ:[id number], Sherilyn Cooter, MD, MD  Subjective: One BM yesterday, tolerating clears  Objective: Vital signs in last 24 hours: Filed Vitals:   06/07/11 1312 06/07/11 1336 06/07/11 2118 06/08/11 0652  BP: 127/84  124/89 114/82  Pulse: 89  87 79  Temp:  97.5 F (36.4 C) 97.7 F (36.5 C) 97.5 F (36.4 C)  TempSrc:  Axillary Axillary Oral  Resp:  17 18 18   Height:      Weight:      SpO2:  100% 96% 100%    Weight change:   Body mass index is 27.37 kg/(m^2).  Intake/Output from previous day:  Intake/Output Summary (Last 24 hours) at 06/08/11 1148 Last data filed at 06/08/11 4782  Gross per 24 hour  Intake 1611.67 ml  Output   1275 ml  Net 336.67 ml    PHYSICAL EXAM: Gen Exam: Awake, speech at baseline Neck: Supple, No JVD.   Chest: B/L Clear.   CVS: S1 S2 Regular, no murmurs.  Abdomen: soft, BS +, non tender, non distended.  Extremities: no edema, lower extremities warm to touch. Neurologic: Non Focal.   Skin: No Rash.   Wounds: N/A.    CONSULTS:  general surgery and vascular surgery  LAB RESULTS: CBC  Lab 06/08/11 1050 06/05/11 0345 06/04/11 0355 06/03/11 0940  WBC 5.3 5.5 4.9 4.9  HGB 13.5 13.4 14.6 18.1*  HCT 39.1 40.7 42.8 49.5  PLT 189 191 188 221  MCV 86.7 91.1 90.3 87.3  MCH 29.9 30.0 30.0 31.9  MCHC 34.5 32.9 33.2 36.6*  RDW 13.0 13.8 13.7 13.2  LYMPHSABS 1.2 -- -- 0.2*  MONOABS 0.6 -- -- 0.8  EOSABS 0.3 -- -- 0.0  BASOSABS 0.0 -- -- 0.0  BANDABS -- -- -- --    Chemistries   Lab 06/08/11 0505 06/06/11 0335 06/05/11 0345 06/04/11 0355 06/03/11 1015  NA 139 138 139 143 138  K 3.6 3.8 3.2* 4.1 4.0  CL 105 105 103 103 96  CO2 25 19 25 27 25   GLUCOSE 102* 67* 87 126* 138*  BUN <3* 10 16 23  30*  CREATININE 0.52 0.60 0.82 1.03 1.44*  CALCIUM 8.4 7.6* 7.8* 8.4 10.3  MG -- -- -- -- 2.0    GFR Estimated Creatinine Clearance: 129.4 ml/min  (by C-G formula based on Cr of 0.52).  Coagulation profile  Lab 06/08/11 0505 06/07/11 0805 06/06/11 0335 06/05/11 0345 06/04/11 0355  INR 3.11* 3.85* 3.85* 3.53* 3.47*  PROTIME -- -- -- -- --    Cardiac Enzymes No results found for this basename: CK:3,CKMB:3,TROPONINI:3,MYOGLOBIN:3 in the last 168 hours  No components found with this basename: POCBNP:3 No results found for this basename: DDIMER:2 in the last 72 hours No results found for this basename: HGBA1C:2 in the last 72 hours No results found for this basename: CHOL:2,HDL:2,LDLCALC:2,TRIG:2,CHOLHDL:2,LDLDIRECT:2 in the last 72 hours No results found for this basename: TSH,T4TOTAL,FREET3,T3FREE,THYROIDAB in the last 72 hours No results found for this basename: VITAMINB12:2,FOLATE:2,FERRITIN:2,TIBC:2,IRON:2,RETICCTPCT:2 in the last 72 hours No results found for this basename: LIPASE:2,AMYLASE:2 in the last 72 hours  Urine Studies No results found for this basename: UACOL:2,UAPR:2,USPG:2,UPH:2,UTP:2,UGL:2,UKET:2,UBIL:2,UHGB:2,UNIT:2,UROB:2,ULEU:2,UEPI:2,UWBC:2,URBC:2,UBAC:2,CAST:2,CRYS:2,UCOM:2,BILUA:2 in the last 72 hours  MICROBIOLOGY: No results found for this or any previous visit (from the past 240 hour(s)).  RADIOLOGY STUDIES/RESULTS: Ct Abdomen Pelvis W Contrast  06/04/2011  *RADIOLOGY REPORT*  Clinical Data: Persistent small bowel obstruction with nausea vomiting.  CT ABDOMEN AND PELVIS  WITH CONTRAST  Technique:  Multidetector CT imaging of the abdomen and pelvis was performed following the standard protocol during bolus administration of intravenous contrast.  Contrast: OMNIPAQUE IOHEXOL 300 MG/ML  SOLN  Comparison: 08/16/2009  Findings: Patchy airspace disease in the posterior left lower lobe is compatible with atelectasis or infiltrate.  No focal abnormalities seen in the liver or spleen.  The stomach, duodenum, pancreas, and adrenal glands are unremarkable.  Calcified stones are seen in the lumen of the gallbladder.   Tiny cortical cyst noted in the left kidney.  Right kidney is unremarkable.  There is diffuse fluid filled dilated small bowel measuring up to 5.5 cm in diameter. The the patient has had a right hemicolectomy and ileocolic anastomoses is seen in the right upper quadrant of the abdomen.  Unfortunately, the patient moved while images were being obtained through this region and the motion artifact degrades the image quality this region.  Nevertheless, there does appear to be a transition zone from a markedly dilated loop anterior to the liver to a completely collapsed loop leading into the entero-colic anastomosis.  There is some fluid and gas in the colon diffusely down to the level of the rectum.  The superior mesenteric artery and vein are patent.  Portal vein is patent.  No abdominal aortic aneurysm.  The SMA and IMA are patent.  Imaging through the pelvis shows no substantial intraperitoneal free fluid.  In the left groin, there is a 3.7 x 2.7 cm irregular vascular lesion which may be a pseudoaneurysm, AV fistula, or venous varix.  There is prominent collateralization in the subcutaneous tissues of the lower left anterior abdominal wall.  Bone windows reveal no worrisome lytic or sclerotic osseous lesions.  IMPRESSION: Diffusely dilated small bowel ( measuring up to 5.5 cm in diameter) tracks up to an apparent transition zone just proximal to the ileocolic anastomoses.  Imaging features suggest mechanical obstruction, potentially secondary to adhesion. Small bowel stricture proximal to the anastomosis would also be a consideration.  Approximately 4 cm vascular "mass" in the left groin region.  Both the common femoral artery and vein track in/or through this structure.  Given the lack of differential enhancement between the arterial and venous anatomy on this study, it is difficult to determine the etiology of this finding.  Differential considerations would include a pseudoaneurysm, AV fistula, or venous varix.  A  focused ultrasound exam may prove helpful to further evaluate.  I personally discussed the results of this study with K. Schorr, mid-level for the hospitalist service, at 2019 hours on 06/04/2011.  Original Report Authenticated By: ERIC A. MANSELL, M.D.   Ct Angio Ao+bifem W/cm &/or Wo/cm  06/07/2011  *RADIOLOGY REPORT*  Clinical data: Left groin mass.  CT ANGIOGRAM ABDOMINAL AORTA AND BILATERAL LOWER EXTREMITIES WITH CONTRAST  Technique:  Axial helical CT of the abdominal aorta and bilateral lower extremities after Optiray 350 IV.  Coronal and sagittal reconstructions were generated for vascular evaluation.  Comparison:  06/04/2011  Arterial findings: Aorta:                  Normal in caliber and contour without significant atheromatous irregularity, dissection, aneurysm, or stenosis.  Celiac axis:            Unremarkable  Superior mesenteric:Unremarkable, with classic distal branching anatomy.  Left renal:             Single, widely patent.  Right renal:  Single, patent proximally.  The distal aspect is degraded by patient motion.  Inferior mesenteric:Unremarkable.  Left iliac:             Mildly ectatic throughout, without focal lesion.  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 This has not significantly changed in size or appearance since scans dating back to 09/28/2008. The common femoral artery is mildly dilated.  Distal SFA and profunda femoris branches unremarkable.  Patent popliteal artery. There is significant venous opacification distally limiting assessment of trifurcation runoff vessels.  Right lower extremity: Common femoral, profunda femoris, superficial femoral, and popliteal arteries unremarkable.  There is significant venous opacification distally, limiting assessment of trifurcation runoff vessels.   Venous findings:  There is marked dilatation of the  left common femoral vein at the level of the AV malformation.  There is distention of the collaterals draining veins extending into the pelvis and in the anterior body wall. No evidence of mesenteric venous involvement. 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 Right iliac venous system, IVC, bilateral renal veins, portal vein, and visualized portions of the hepatic veins are patent.  Review of the MIP images confirms the above findings.  Nonvascular findings: Visualized portions of lung bases unremarkable.  Unremarkable visualized portions of the liver and spleen, adrenal glands, kidneys, pancreas.  At least two partially calcified stones layer in the dependent aspect of the gallbladder, largest measuring 1 cm in maximal transverse diameter.  No ascites. No free air.  Stomach and proximal small bowel decompressed.  There is an anastomotic staple line in the right lower quadrant with circumferential wall thickening and mild distention of the associated small bowel loops, which has progressed since the previous study.  The small bowel dilatation seen previously has resolved.  The colon is nondilated.  Urinary bladder incompletely distended.  Mild prostatic enlargement.  No adenopathy localized.  IMPRESSION:  1.  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 No change in appearance since 09/28/2008.  Probably congenital. 2.  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 3.  Cholelithiasis. 4.  Interval decrease in small bowel dilatation. There is  significant circumferential wall thickening in distal small bowel loops.  Original Report Authenticated By: Osa Craver, M.D.   Ir Fluoro Guide Cv Line Right  06/06/2011  *RADIOLOGY REPORT*  Clinical Data: Infection, access for IV antibiotics  PICC LINE PLACEMENT WITH ULTRASOUND AND FLUOROSCOPIC  GUIDANCE  Fluoroscopy Time: 0.4 minutes.  The right arm was prepped with chlorhexidine, draped in the usual sterile fashion using maximum barrier technique (cap and mask, sterile gown, sterile gloves, large sterile sheet, hand hygiene and cutaneous antisepsis) and infiltrated locally with 1% Lidocaine.  Ultrasound demonstrated patency of the right brachial vein, and this was documented with an image.  Under real-time ultrasound guidance, this vein was accessed with a 21 gauge micropuncture needle and image documentation was performed.  The needle was exchanged over a guidewire for a peel-away  sheath through which a 5 [place] double lumen PICC trimmed to 39 cm was advanced, positioned with its tip at the lower SVC/right atrial junction.  Fluoroscopy during the procedure and fluoro spot radiograph confirms appropriate catheter position.  The catheter was flushed, secured to the skin with Prolene sutures, and covered with a sterile dressing.  Complications:  No immediate  IMPRESSION: Successful right arm PICC line placement with ultrasound and fluoroscopic guidance.  The catheter is ready for use.  Original Report Authenticated By: Judie Petit. Ruel Favors, M.D.   Ir US Guide Vasc Access Right  06/06/2011  *RADIOLOGY REPORT*  Clinical Data: Infection, access for IV antibiotics  PICC LINE PLACEMENT WITH ULTRASOUND AND FLUOROSCOPIC  GUIDANCE  Fluoroscopy Time: 0.4 minutes.  The right arm was prepped with chlorhexidine, draped in the usual sterile fashion using maximum barrier technique (cap and mask, sterile gown, sterile gloves, large sterile sheet, hand hygiene and cutaneous antisepsis) and infiltrated locally with 1%  Lidocaine.  Ultrasound demonstrated patency of the right brachial vein, and this was documented with an image.  Under real-time ultrasound guidance, this vein was accessed with a 21 gauge micropuncture needle and image documentation was performed.  The needle was exchanged over a guidewire for a peel-away sheath through which a 5 [place] double lumen PICC trimmed to 39 cm was advanced, positioned with its tip at the lower SVC/right atrial junction.  Fluoroscopy during the procedure and fluoro spot radiograph confirms appropriate catheter position.  The catheter was flushed, secured to the skin with Prolene sutures, and covered with a sterile dressing.  Complications:  No immediate  IMPRESSION: Successful right arm PICC line placement with ultrasound and fluoroscopic guidance.  The catheter is ready for use.  Original Report Authenticated By: Judie Petit. Ruel Favors, M.D.   Dg Abd Acute W/chest  06/04/2011  *RADIOLOGY REPORT*  Clinical Data: Small bowel obstruction, abdominal pain, follow-up  ACUTE ABDOMEN SERIES (ABDOMEN 2 VIEW & CHEST 1 VIEW)  Comparison: 06/03/2011  Findings: Upper normal heart size. Stable mediastinal contours. Slight rotation to the left. Bibasilar atelectasis greater on left. No definite infiltrate or effusion. Cervicothoracic scoliosis.  Persistent dilatation of small bowel loops though some gas is present within the colon and rectum. Findings remain most consistent with small bowel obstruction. Ovoid calcifications right upper quadrant consistent with cholelithiasis. No definite free intraperitoneal air or bowel wall thickening. No acute osseous findings.  IMPRESSION: Persistent small bowel dilatation consistent with small bowel obstruction. Cause of bowel obstruction is uncertain; consider CT imaging with IV and potentially oral contrast to assess. No definite evidence of perforation or bowel wall thickening. Cholelithiasis. Bibasilar atelectasis greater on the left.  Original Report Authenticated  By: Lollie Marrow, M.D.   Dg Abd Acute W/chest  06/03/2011  *RADIOLOGY REPORT*  Clinical Data: Abdominal pain, vomiting  ACUTE ABDOMEN SERIES (ABDOMEN 2 VIEW & CHEST 1 VIEW)  Comparison: 08/27/2009  Findings: The cardiomediastinal silhouette is stable.  No convincing pulmonary edema. Bilateral basilar atelectasis or infiltrate.  Significant gaseous distended small bowel loops with multiple air- fluid levels best seen on decubitus view highly suspicious for small bowel obstruction.  No free abdominal air.  IMPRESSION: Bilateral basilar atelectasis or infiltrate.  Significant gaseous distended small bowel loops with air-fluid levels highly suspicious for small bowel obstruction.  Original Report Authenticated By: Natasha Mead, M.D.   Dg Abd Portable 2v  06/05/2011  *RADIOLOGY REPORT*  Clinical Data: Small bowel obstruction  PORTABLE ABDOMEN - 2 VIEW  Comparison: 06/04/2011  Findings: No free  air on the left lateral decubitus radiograph. There are dilated small bowel loops in the mid abdomen with fluid levels on the decubitus radiograph.  The colon appears nondilated. Regional bones unremarkable.  Left pelvic phleboliths.  IMPRESSION:  1.  Persistent mid to distal small bowel obstruction. 2.  No free air.  Original Report Authenticated By: Osa Craver, M.D.    MEDICATIONS: Scheduled Meds:    . baclofen  20 mg Oral QID  . bisacodyl  10 mg Rectal BID  . diazepam      . diazepam  2.5 mg Intravenous Once  . diazepam  5 mg Intravenous Once  . diazepam  2 mg Oral Q12H  . hydrochlorothiazide  25 mg Oral Daily  . metoprolol succinate  100 mg Oral BID  . polyethylene glycol  17 g Oral Daily  . Warfarin - Pharmacist Dosing Inpatient   Does not apply q1800   Continuous Infusions:    . 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 06/08/2011, 11:48 AM

## 2011-06-08 NOTE — Progress Notes (Signed)
ANTICOAGULATION CONSULT NOTE - Follow Up Consult  Pharmacy Consult for Coumadin  Indication Hx DVT:   Allergies  Allergen Reactions  . Betadine (Povidone Iodine)   . Ciprofloxacin   . Morphine     Patient Measurements: Height: 5\' 8"  (172.7 cm) Weight: 180 lb (81.647 kg) IBW/kg (Calculated) : 68.4    Vital Signs: Temp: 97.5 F (36.4 C) (05/11 0652) Temp src: Oral (05/11 0652) BP: 114/82 mmHg (05/11 0652) Pulse Rate: 79  (05/11 0652)  Labs:  Basename 06/08/11 1050 06/08/11 0505 06/07/11 0805 06/06/11 0335  HGB 13.5 -- -- --  HCT 39.1 -- -- --  PLT 189 -- -- --  APTT -- -- -- --  LABPROT -- 32.5* 38.4* 38.4*  INR -- 3.11* 3.85* 3.85*  HEPARINUNFRC -- -- -- --  CREATININE -- 0.52 -- 0.60  CKTOTAL -- -- -- --  CKMB -- -- -- --  TROPONINI -- -- -- --    Estimated Creatinine Clearance: 129.4 ml/min (by C-G formula based on Cr of 0.52).   Medications:  Scheduled:    . baclofen  20 mg Oral QID  . bisacodyl  10 mg Rectal BID  . diazepam      . diazepam  5 mg Intravenous Once  . diazepam  2 mg Oral Q12H  . hydrochlorothiazide  25 mg Oral Daily  . metoprolol succinate  100 mg Oral BID  . polyethylene glycol  17 g Oral Daily  . Warfarin - Pharmacist Dosing Inpatient   Does not apply q1800    Assessment:30yo M tx from [facility] on 5/9 with cerebral palsy admitted with abd distention, constipation, N/V, and small amount of blood tinged emesis found to have SBO. CT revealed L groin mass, VVS has seen patient and plan is to perform CTA to further delineate etiology of mass (possibly, pseudoaneursym, AV fistula, venous varix). Pharmacy consulted for Coumadin dosing for h/o LLE DVT. INR remains supratherapeutic this am at 3.11, last dose of coumadin 4mg  on 5/6. No bleeding documented.  Home dose is 4mg  MWF, 2mg  other days   Goal of Therapy:  INR 2-3 Daily INR   Plan:  No Coumadin today Daily INR  Lucille Passy 06/08/2011,3:42 PM

## 2011-06-09 ENCOUNTER — Inpatient Hospital Stay (HOSPITAL_COMMUNITY): Payer: 59

## 2011-06-09 DIAGNOSIS — R1900 Intra-abdominal and pelvic swelling, mass and lump, unspecified site: Secondary | ICD-10-CM

## 2011-06-09 DIAGNOSIS — G808 Other cerebral palsy: Secondary | ICD-10-CM

## 2011-06-09 DIAGNOSIS — K56609 Unspecified intestinal obstruction, unspecified as to partial versus complete obstruction: Secondary | ICD-10-CM

## 2011-06-09 DIAGNOSIS — R1033 Periumbilical pain: Secondary | ICD-10-CM

## 2011-06-09 LAB — BASIC METABOLIC PANEL
BUN: <3 mg/dL — ABNORMAL LOW (ref 6–23)
Calcium: 8.6 mg/dL (ref 8.4–10.5)
GFR calc non Af Amer: >90 mL/min (ref >90)
Glucose, Bld: 99 mg/dL (ref 70–99)

## 2011-06-09 MED ORDER — ADULT MULTIVITAMIN W/MINERALS CH
1.0000 | ORAL_TABLET | Freq: Every day | ORAL | Status: DC
  Administered 2011-06-09 – 2011-06-10 (×2): 1 via ORAL
  Filled 2011-06-09 (×2): qty 1

## 2011-06-09 MED ORDER — WARFARIN SODIUM 3 MG PO TABS
3.0000 mg | ORAL_TABLET | Freq: Once | ORAL | Status: AC
  Administered 2011-06-09: 3 mg via ORAL
  Filled 2011-06-09: qty 1

## 2011-06-09 MED ORDER — FOOD THICKENER (THICKENUP CLEAR)
ORAL | Status: DC | PRN
  Filled 2011-06-09: qty 120

## 2011-06-09 MED ORDER — BISACODYL 10 MG RE SUPP
10.0000 mg | Freq: Two times a day (BID) | RECTAL | Status: DC | PRN
  Filled 2011-06-09: qty 1

## 2011-06-09 NOTE — Progress Notes (Signed)
PATIENT DETAILS Name: [name] Age: 31 y.o. Sex: male Date of Birth: 23-Dec-1980 Admit Date: 06/03/2011 ONG:EXBMW, Sherilyn Cooter, MD, MD  Subjective: One large BM 5/11, tolerating full liquids  Objective: Vital signs in last 24 hours: Filed Vitals:   06/08/11 0652 06/08/11 1500 06/08/11 2053 06/09/11 0544  BP: 114/82 117/81 120/80 110/77  Pulse: 79 89 85 72  Temp: 97.5 F (36.4 C) 97.9 F (36.6 C) 98 F (36.7 C) 97.5 F (36.4 C)  TempSrc: Oral Oral Oral Axillary  Resp: 18 18 20 17   Height:      Weight:      SpO2: 100% 100% 95% 95%    Weight change:   Body mass index is 27.37 kg/(m^2).  Intake/Output from previous day:  Intake/Output Summary (Last 24 hours) at 06/09/11 1057 Last data filed at 06/09/11 0547  Gross per 24 hour  Intake    240 ml  Output   1200 ml  Net   -960 ml    PHYSICAL EXAM: Gen Exam: Awake, speech at baseline Neck: Supple, No JVD.   Chest: B/L Clear.  No added sounds CVS: S1 S2 Regular, no murmurs.  Abdomen: soft, BS +, non tender, non distended.  Extremities: no edema, lower extremities warm to touch. Neurologic: Non Focal.   Skin: No Rash.   Wounds: N/A.    CONSULTS:  general surgery and vascular surgery  LAB RESULTS: CBC  Lab 06/08/11 1050 06/05/11 0345 06/04/11 0355 06/03/11 0940  WBC 5.3 5.5 4.9 4.9  HGB 13.5 13.4 14.6 18.1*  HCT 39.1 40.7 42.8 49.5  PLT 189 191 188 221  MCV 86.7 91.1 90.3 87.3  MCH 29.9 30.0 30.0 31.9  MCHC 34.5 32.9 33.2 36.6*  RDW 13.0 13.8 13.7 13.2  LYMPHSABS 1.2 -- -- 0.2*  MONOABS 0.6 -- -- 0.8  EOSABS 0.3 -- -- 0.0  BASOSABS 0.0 -- -- 0.0  BANDABS -- -- -- --    Chemistries   Lab 06/09/11 0500 06/08/11 0505 06/06/11 0335 06/05/11 0345 06/04/11 0355 06/03/11 1015  NA 140 139 138 139 143 --  K 4.1 3.6 3.8 3.2* 4.1 --  CL 105 105 105 103 103 --  CO2 26 25 19 25 27  --  GLUCOSE 99 102* 67* 87 126* --  BUN <3* <3* 10 16 23  --  CREATININE 0.56 0.52 0.60 0.82 1.03 --  CALCIUM 8.6 8.4 7.6* 7.8*  8.4 --  MG -- -- -- -- -- 2.0    GFR Estimated Creatinine Clearance: 129.4 ml/min (by C-G formula based on Cr of 0.56).  Coagulation profile  Lab 06/09/11 0500 06/08/11 0505 06/07/11 0805 06/06/11 0335 06/05/11 0345  INR 2.34* 3.11* 3.85* 3.85* 3.53*  PROTIME -- -- -- -- --    Cardiac Enzymes No results found for this basename: CK:3,CKMB:3,TROPONINI:3,MYOGLOBIN:3 in the last 168 hours  No components found with this basename: POCBNP:3 No results found for this basename: DDIMER:2 in the last 72 hours No results found for this basename: HGBA1C:2 in the last 72 hours No results found for this basename: CHOL:2,HDL:2,LDLCALC:2,TRIG:2,CHOLHDL:2,LDLDIRECT:2 in the last 72 hours No results found for this basename: TSH,T4TOTAL,FREET3,T3FREE,THYROIDAB in the last 72 hours No results found for this basename: VITAMINB12:2,FOLATE:2,FERRITIN:2,TIBC:2,IRON:2,RETICCTPCT:2 in the last 72 hours No results found for this basename: LIPASE:2,AMYLASE:2 in the last 72 hours  Urine Studies No results found for this basename: UACOL:2,UAPR:2,USPG:2,UPH:2,UTP:2,UGL:2,UKET:2,UBIL:2,UHGB:2,UNIT:2,UROB:2,ULEU:2,UEPI:2,UWBC:2,URBC:2,UBAC:2,CAST:2,CRYS:2,UCOM:2,BILUA:2 in the last 72 hours  MICROBIOLOGY: No results found for this or any previous visit (from the past 240 hour(s)).  RADIOLOGY STUDIES/RESULTS: Ct Abdomen  Pelvis W Contrast  06/04/2011  *RADIOLOGY REPORT*  Clinical Data: Persistent small bowel obstruction with nausea vomiting.  CT ABDOMEN AND PELVIS WITH CONTRAST  Technique:  Multidetector CT imaging of the abdomen and pelvis was performed following the standard protocol during bolus administration of intravenous contrast.  Contrast: OMNIPAQUE IOHEXOL 300 MG/ML  SOLN  Comparison: 08/16/2009  Findings: Patchy airspace disease in the posterior left lower lobe is compatible with atelectasis or infiltrate.  No focal abnormalities seen in the liver or spleen.  The stomach, duodenum, pancreas, and adrenal  glands are unremarkable.  Calcified stones are seen in the lumen of the gallbladder.  Tiny cortical cyst noted in the left kidney.  Right kidney is unremarkable.  There is diffuse fluid filled dilated small bowel measuring up to 5.5 cm in diameter. The the patient has had a right hemicolectomy and ileocolic anastomoses is seen in the right upper quadrant of the abdomen.  Unfortunately, the patient moved while images were being obtained through this region and the motion artifact degrades the image quality this region.  Nevertheless, there does appear to be a transition zone from a markedly dilated loop anterior to the liver to a completely collapsed loop leading into the entero-colic anastomosis.  There is some fluid and gas in the colon diffusely down to the level of the rectum.  The superior mesenteric artery and vein are patent.  Portal vein is patent.  No abdominal aortic aneurysm.  The SMA and IMA are patent.  Imaging through the pelvis shows no substantial intraperitoneal free fluid.  In the left groin, there is a 3.7 x 2.7 cm irregular vascular lesion which may be a pseudoaneurysm, AV fistula, or venous varix.  There is prominent collateralization in the subcutaneous tissues of the lower left anterior abdominal wall.  Bone windows reveal no worrisome lytic or sclerotic osseous lesions.  IMPRESSION: Diffusely dilated small bowel ( measuring up to 5.5 cm in diameter) tracks up to an apparent transition zone just proximal to the ileocolic anastomoses.  Imaging features suggest mechanical obstruction, potentially secondary to adhesion. Small bowel stricture proximal to the anastomosis would also be a consideration.  Approximately 4 cm vascular "mass" in the left groin region.  Both the common femoral artery and vein track in/or through this structure.  Given the lack of differential enhancement between the arterial and venous anatomy on this study, it is difficult to determine the etiology of this finding.   Differential considerations would include a pseudoaneurysm, AV fistula, or venous varix.  A focused ultrasound exam may prove helpful to further evaluate.  I personally discussed the results of this study with K. Schorr, mid-level for the hospitalist service, at 2019 hours on 06/04/2011.  Original Report Authenticated By: ERIC A. MANSELL, M.D.   Ct Angio Ao+bifem W/cm &/or Wo/cm  06/07/2011  *RADIOLOGY REPORT*  Clinical data: Left groin mass.  CT ANGIOGRAM ABDOMINAL AORTA AND BILATERAL LOWER EXTREMITIES WITH CONTRAST  Technique:  Axial helical CT of the abdominal aorta and bilateral lower extremities after Optiray 350 IV.  Coronal and sagittal reconstructions were generated for vascular evaluation.  Comparison:  06/04/2011  Arterial findings: Aorta:                  Normal in caliber and contour without significant atheromatous irregularity, dissection, aneurysm, or stenosis.  Celiac axis:            Unremarkable  Superior mesenteric:Unremarkable, with classic distal branching anatomy.  Left renal:  Single, widely patent.  Right renal:            Single, patent proximally.  The distal aspect is degraded by patient motion.  Inferior mesenteric:Unremarkable.  Left iliac:             Mildly ectatic throughout, without focal lesion.  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 This has not significantly changed in size or appearance since scans dating back to 09/28/2008. The common femoral artery is mildly dilated.  Distal SFA and profunda femoris branches unremarkable.  Patent popliteal artery. There is significant venous opacification distally limiting assessment of trifurcation runoff vessels.  Right lower extremity: Common femoral, profunda femoris, superficial femoral, and popliteal arteries unremarkable.  There is  significant venous opacification distally, limiting assessment of trifurcation runoff vessels.  Venous findings:  There is marked dilatation of the  left common femoral vein at the level of the AV malformation.  There is distention of the collaterals draining veins extending into the pelvis and in the anterior body wall. No evidence of mesenteric venous involvement. 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 Right iliac venous system, IVC, bilateral renal veins, portal vein, and visualized portions of the hepatic veins are patent.  Review of the MIP images confirms the above findings.  Nonvascular findings: Visualized portions of lung bases unremarkable.  Unremarkable visualized portions of the liver and spleen, adrenal glands, kidneys, pancreas.  At least two partially calcified stones layer in the dependent aspect of the gallbladder, largest measuring 1 cm in maximal transverse diameter.  No ascites. No free air.  Stomach and proximal small bowel decompressed.  There is an anastomotic staple line in the right lower quadrant with circumferential wall thickening and mild distention of the associated small bowel loops, which has progressed since the previous study.  The small bowel dilatation seen previously has resolved.  The colon is nondilated.  Urinary bladder incompletely distended.  Mild prostatic enlargement.  No adenopathy localized.  IMPRESSION:  1.  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 No change in appearance since 09/28/2008.  Probably congenital. 2.  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 3.  Cholelithiasis. 4.  Interval decrease in small bowel dilatation. There is significant circumferential wall thickening in distal small bowel loops.  Original Report Authenticated By: Osa Craver, M.D.   Ir Fluoro Guide Cv Line Right  06/06/2011  *RADIOLOGY REPORT*  Clinical Data: Infection, access for IV antibiotics  PICC LINE PLACEMENT WITH ULTRASOUND AND FLUOROSCOPIC  GUIDANCE  Fluoroscopy Time: 0.4 minutes.  The right arm was prepped with chlorhexidine, draped in the usual sterile fashion using maximum barrier technique (cap and mask, sterile gown, sterile gloves, large sterile sheet, hand hygiene and cutaneous antisepsis) and infiltrated locally with 1% Lidocaine.  Ultrasound demonstrated patency of the right brachial vein, and this was documented with an image.  Under real-time ultrasound guidance, this vein was accessed with a 21 gauge micropuncture  needle and image documentation was performed.  The needle was exchanged over a guidewire for a peel-away sheath through which a 5 [place] double lumen PICC trimmed to 39 cm was advanced, positioned with its tip at the lower SVC/right atrial junction.  Fluoroscopy during the procedure and fluoro spot radiograph confirms appropriate catheter position.  The catheter was flushed, secured to the skin with Prolene sutures, and covered with a sterile dressing.  Complications:  No immediate  IMPRESSION: Successful right arm PICC line placement with ultrasound and fluoroscopic guidance.  The catheter is ready for use.  Original Report Authenticated By: Judie Petit. Ruel Favors, M.D.   Ir US Guide Vasc Access Right  06/06/2011  *RADIOLOGY REPORT*  Clinical Data: Infection, access for IV antibiotics  PICC LINE PLACEMENT WITH ULTRASOUND AND FLUOROSCOPIC  GUIDANCE  Fluoroscopy Time: 0.4 minutes.  The right arm was prepped with chlorhexidine, draped in the usual sterile fashion using maximum barrier technique (cap and mask, sterile gown, sterile  gloves, large sterile sheet, hand hygiene and cutaneous antisepsis) and infiltrated locally with 1% Lidocaine.  Ultrasound demonstrated patency of the right brachial vein, and this was documented with an image.  Under real-time ultrasound guidance, this vein was accessed with a 21 gauge micropuncture needle and image documentation was performed.  The needle was exchanged over a guidewire for a peel-away sheath through which a 5 [place] double lumen PICC trimmed to 39 cm was advanced, positioned with its tip at the lower SVC/right atrial junction.  Fluoroscopy during the procedure and fluoro spot radiograph confirms appropriate catheter position.  The catheter was flushed, secured to the skin with Prolene sutures, and covered with a sterile dressing.  Complications:  No immediate  IMPRESSION: Successful right arm PICC line placement with ultrasound and fluoroscopic guidance.  The catheter is ready for use.  Original Report Authenticated By: Judie Petit. Ruel Favors, M.D.   Dg Abd Acute W/chest  06/04/2011  *RADIOLOGY REPORT*  Clinical Data: Small bowel obstruction, abdominal pain, follow-up  ACUTE ABDOMEN SERIES (ABDOMEN 2 VIEW & CHEST 1 VIEW)  Comparison: 06/03/2011  Findings: Upper normal heart size. Stable mediastinal contours. Slight rotation to the left. Bibasilar atelectasis greater on left. No definite infiltrate or effusion. Cervicothoracic scoliosis.  Persistent dilatation of small bowel loops though some gas is present within the colon and rectum. Findings remain most consistent with small bowel obstruction. Ovoid calcifications right upper quadrant consistent with cholelithiasis. No definite free intraperitoneal air or bowel wall thickening. No acute osseous findings.  IMPRESSION: Persistent small bowel dilatation consistent with small bowel obstruction. Cause of bowel obstruction is uncertain; consider CT imaging with IV and potentially oral contrast to assess. No definite evidence of perforation or bowel wall  thickening. Cholelithiasis. Bibasilar atelectasis greater on the left.  Original Report Authenticated By: Lollie Marrow, M.D.   Dg Abd Acute W/chest  06/03/2011  *RADIOLOGY REPORT*  Clinical Data: Abdominal pain, vomiting  ACUTE ABDOMEN SERIES (ABDOMEN 2 VIEW & CHEST 1 VIEW)  Comparison: 08/27/2009  Findings: The cardiomediastinal silhouette is stable.  No convincing pulmonary edema. Bilateral basilar atelectasis or infiltrate.  Significant gaseous distended small bowel loops with multiple air- fluid levels best seen on decubitus view highly suspicious for small bowel obstruction.  No free abdominal air.  IMPRESSION: Bilateral basilar atelectasis or infiltrate.  Significant gaseous distended small bowel loops with air-fluid levels highly suspicious for small bowel obstruction.  Original Report Authenticated By: Natasha Mead, M.D.   Dg Abd Portable 2v  06/05/2011  *RADIOLOGY REPORT*  Clinical  Data: Small bowel obstruction  PORTABLE ABDOMEN - 2 VIEW  Comparison: 06/04/2011  Findings: No free air on the left lateral decubitus radiograph. There are dilated small bowel loops in the mid abdomen with fluid levels on the decubitus radiograph.  The colon appears nondilated. Regional bones unremarkable.  Left pelvic phleboliths.  IMPRESSION:  1.  Persistent mid to distal small bowel obstruction. 2.  No free air.  Original Report Authenticated By: Thora Lance III, M.D.    MEDICATIONS: Scheduled Meds:    . baclofen  20 mg Oral QID  . diazepam  2 mg Oral Q12H  . hydrochlorothiazide  25 mg Oral Daily  . metoprolol succinate  100 mg Oral BID  . mulitivitamin with minerals  1 tablet Oral Daily  . polyethylene glycol  17 g Oral Daily  . Warfarin - Pharmacist Dosing Inpatient   Does not apply q1800  . DISCONTD: bisacodyl  10 mg Rectal BID  . DISCONTD: diazepam  5 mg Intravenous Once   Continuous Infusions:    . DISCONTD: dextrose 5 % and 0.9 % NaCl with KCl 40 mEq/L 50 mL/hr at 06/09/11 1041   PRN  Meds:.bisacodyl, diazepam, HYDROmorphone (DILAUDID) injection, ondansetron, sodium chloride  Antibiotics: Anti-infectives    None      Assessment/Plan: Patient Active Hospital Problem List: Small bowel obstruction  -likely from adhesions -one BM 5/11 as well. 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 06/09/2011, 10:57 AM

## 2011-06-09 NOTE — Progress Notes (Signed)
[name] [id number] Jul 06, 1980  CARE TEAM:  PCP: Florentina Jenny, MD, MD  Outpatient Care Team: Patient Care Team: Florentina Jenny as PCP - General (Family Medicine)  Inpatient Treatment Team: Treatment Team: Attending Provider: Maretta Bees, MD; Technician: Herma Ard; Registered Nurse: Barrie Lyme, RN; Consulting Physician: Bishop Limbo, MD; Technician: Lemar Livings, [place]; Consulting Physician: Nada Libman, MD; Registered Nurse: Julieanne Manson Fleurizard, RN; Rounding Team: Simon Rhein, MD; Technician: Venetia Maxon, NT; Registered Nurse: Toya Smothers, RN; Registered Nurse: Cindra Eves, RN  Subjective:  Large BM yest Denies pain No N/V Feels much better  Objective:  Vital signs:  Filed Vitals:   06/08/11 0652 06/08/11 1500 06/08/11 2053 06/09/11 0544  BP: 114/82 117/81 120/80 110/77  Pulse: 79 89 85 72  Temp: 97.5 F (36.4 C) 97.9 F (36.6 C) 98 F (36.7 C) 97.5 F (36.4 C)  TempSrc: Oral Oral Oral Axillary  Resp: 18 18 20 17   Height:      Weight:      SpO2: 100% 100% 95% 95%    Last BM Date: 06/08/11  Intake/Output   Yesterday:  05/11 0701 - 05/12 0700 In: 480 [P.O.:480] Out: 1200 [Urine:1200] This shift:     Bowel function:  Flatus: yes  BM: LArge   Physical Exam:  General: Pt awake/alert/oriented x4 in no acute distress Eyes: PERRL, normal EOM.  Sclera clear.  No icterus Neuro: CN II-XII intact.  Sensory intact.   Basic answers to questions given speech difficulty with severe CP Lymph: No head/neck/groin lymphadenopathy Psych:  No delerium/psychosis/paranoia.  Smiling HENT: Mucus membranes moist.  No thrush Neck: Supple, No tracheal deviation Chest: No chest wall pain w good excursion CV:  Pulses intact.  Regular rhythm Abdomen: Soft.  Obese but flat.  Nondistended.  Nontender.  No incarcerated hernias. Ext:  SCDs BLE.  No mjr edema.  No cyanosis.  Moderate contractures Skin: No petechiae /  purpurae  Results:   Labs: Results for orders placed during the hospital encounter of 06/03/11 (from the past 48 hour(s))  PROTIME-INR     Status: Abnormal   Collection Time   06/08/11  5:05 AM      Component Value Range Comment   Prothrombin Time 32.5 (*) 11.6 - 15.2 (seconds)    INR 3.11 (*) 0.00 - 1.49    BASIC METABOLIC PANEL     Status: Abnormal   Collection Time   06/08/11  5:05 AM      Component Value Range Comment   Sodium 139  135 - 145 (mEq/L)    Potassium 3.6  3.5 - 5.1 (mEq/L)    Chloride 105  96 - 112 (mEq/L)    CO2 25  19 - 32 (mEq/L)    Glucose, Bld 102 (*) 70 - 99 (mg/dL)    BUN <3 (*) 6 - 23 (mg/dL)    Creatinine, Ser 6.57  0.50 - 1.35 (mg/dL)    Calcium 8.4  8.4 - 10.5 (mg/dL)    GFR calc non Af Amer >90  >90 (mL/min)    GFR calc Af Amer >90  >90 (mL/min)   CBC     Status: Normal   Collection Time   06/08/11 10:50 AM      Component Value Range Comment   WBC 5.3  4.0 - 10.5 (K/uL)    RBC 4.51  4.22 - 5.81 (MIL/uL)    Hemoglobin 13.5  13.0 - 17.0 (g/dL)    HCT 84.6  96.2 -  52.0 (%)    MCV 86.7  78.0 - 100.0 (fL)    MCH 29.9  26.0 - 34.0 (pg)    MCHC 34.5  30.0 - 36.0 (g/dL)    RDW 16.1  09.6 - 04.5 (%)    Platelets 189  150 - 400 (K/uL)   DIFFERENTIAL     Status: Abnormal   Collection Time   06/08/11 10:50 AM      Component Value Range Comment   Neutrophils Relative 59  43 - 77 (%)    Neutro Abs 3.1  1.7 - 7.7 (K/uL)    Lymphocytes Relative 22  12 - 46 (%)    Lymphs Abs 1.2  0.7 - 4.0 (K/uL)    Monocytes Relative 12  3 - 12 (%)    Monocytes Absolute 0.6  0.1 - 1.0 (K/uL)    Eosinophils Relative 6 (*) 0 - 5 (%)    Eosinophils Absolute 0.3  0.0 - 0.7 (K/uL)    Basophils Relative 1  0 - 1 (%)    Basophils Absolute 0.0  0.0 - 0.1 (K/uL)   PROTIME-INR     Status: Abnormal   Collection Time   06/09/11  5:00 AM      Component Value Range Comment   Prothrombin Time 26.0 (*) 11.6 - 15.2 (seconds)    INR 2.34 (*) 0.00 - 1.49    BASIC METABOLIC PANEL      Status: Abnormal   Collection Time   06/09/11  5:00 AM      Component Value Range Comment   Sodium 140  135 - 145 (mEq/L)    Potassium 4.1  3.5 - 5.1 (mEq/L)    Chloride 105  96 - 112 (mEq/L)    CO2 26  19 - 32 (mEq/L)    Glucose, Bld 99  70 - 99 (mg/dL)    BUN <3 (*) 6 - 23 (mg/dL)    Creatinine, Ser 4.09  0.50 - 1.35 (mg/dL)    Calcium 8.6  8.4 - 10.5 (mg/dL)    GFR calc non Af Amer >90  >90 (mL/min)    GFR calc Af Amer >90  >90 (mL/min)     Imaging / Studies: Ct Angio Ao+bifem W/cm &/or Wo/cm  06/07/2011  *RADIOLOGY REPORT*  Clinical data: Left groin mass.  CT ANGIOGRAM ABDOMINAL AORTA AND BILATERAL LOWER EXTREMITIES WITH CONTRAST  Technique:  Axial helical CT of the abdominal aorta and bilateral lower extremities after Optiray 350 IV.  Coronal and sagittal reconstructions were generated for vascular evaluation.  Comparison:  06/04/2011  Arterial findings: Aorta:                  Normal in caliber and contour without significant atheromatous irregularity, dissection, aneurysm, or stenosis.  Celiac axis:            Unremarkable  Superior mesenteric:Unremarkable, with classic distal branching anatomy.  Left renal:             Single, widely patent.  Right renal:            Single, patent proximally.  The distal aspect is degraded by patient motion.  Inferior mesenteric:Unremarkable.  Left iliac:             Mildly ectatic throughout, without focal lesion.  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 This has not significantly changed in size or appearance since scans dating back to 09/28/2008. The common femoral artery is mildly dilated.  Distal SFA and profunda femoris branches unremarkable.  Patent popliteal artery. There is significant venous opacification distally limiting assessment of trifurcation runoff vessels.   Right lower extremity: Common femoral, profunda femoris, superficial femoral, and popliteal arteries unremarkable.  There is significant venous opacification distally, limiting assessment of trifurcation runoff vessels.  Venous findings:  There is marked dilatation of the  left common femoral vein at the level of the AV malformation.  There is distention of the collaterals draining veins extending into the pelvis and in the anterior body wall. No evidence of mesenteric venous involvement. 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 Right iliac venous system, IVC, bilateral renal veins, portal vein, and visualized portions of the hepatic veins are patent.  Review of the MIP images confirms the above findings.  Nonvascular findings: Visualized portions of lung bases unremarkable.  Unremarkable visualized portions of the liver and spleen, adrenal glands, kidneys, pancreas.  At least two partially calcified stones layer in the dependent aspect of the gallbladder, largest measuring 1 cm in maximal transverse diameter.  No ascites. No free air.  Stomach and proximal small bowel decompressed.  There is an anastomotic staple line in the right lower quadrant with circumferential wall thickening and mild distention of the associated small bowel loops, which has progressed since the previous study.  The small bowel dilatation seen previously has resolved.  The colon is nondilated.  Urinary bladder incompletely distended.  Mild prostatic enlargement.  No adenopathy localized.  IMPRESSION:  1.  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 No change in appearance since 09/28/2008.  Probably  congenital. 2.  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 3.  Cholelithiasis. 4.  Interval decrease in small bowel dilatation. There is significant circumferential wall thickening in distal small bowel loops.  Original Report Authenticated By: Osa Craver, M.D.   Dg Abd Portable 2v  06/08/2011  *RADIOLOGY REPORT*  Clinical Data: Assess for small bowel obstruction  PORTABLE ABDOMEN - 2 VIEW  Comparison: 06/05/2011 and CT scan 06/07/2011  Findings: Again noted gaseous distended small bowel loops suspicious for ileus or partial bowel obstruction.  There is slight improvement from prior x-ray 05/08/ 13. No definite free air noted on decubitus view.  IMPRESSION:  Again noted gaseous distended small bowel loops suspicious for ileus or partial bowel obstruction.  There is slight improvement from prior x-ray 05/08/ 13. No definite free air noted on decubitus view.  Original Report Authenticated By: Natasha Mead, M.D.    Medications / Allergies: per chart  Antibiotics: Anti-infectives    None      Problem List:  Principal Problem:  *Small bowel obstruction Active Problems:  CEREBRAL PALSY  Atrial fibrillation  Acute renal failure  Chronic constipation  Hypertension  Hypokalemia  Low TSH level  Groin mass   Assessment  Altha Harm  31 y.o. male       PSBO resolving with probable component of constipation/colon dysmotility  Plan: -strict I&O -adv diet  y -bowel regimen, -prox colon mildly thickened on CT but no pneumatosis.  Nontender, no fever, not obstructive - follow clinically -incidental GS - no biliary colic = no chole -VTE prophylaxis- SCDs, etc -mobilize as tolerated to help recovery  D/w Int Med primary service (Dr. Jerral Ralph).  If continues to improve, prob D/C tomorrow.  Call with questions, will see PRN.  Ardeth Sportsman, M.D., F.A.C.S. Gastrointestinal and [facility]imally  Invasive [facility] [address] [place], [place] [address] [phone] Main / Paging [phone] Voice Mail   06/09/2011

## 2011-06-09 NOTE — Progress Notes (Signed)
ANTICOAGULATION CONSULT NOTE - Follow Up Consult  Pharmacy Consult for Coumadin Indication: Hx DVT  Allergies  Allergen Reactions  . Betadine (Povidone Iodine)   . Ciprofloxacin   . Morphine     Patient Measurements: Height: 5\' 8"  (172.7 cm) Weight: 180 lb (81.647 kg) IBW/kg (Calculated) : 68.4    Vital Signs: Temp: 97.5 F (36.4 C) (05/12 0544) Temp src: Axillary (05/12 0544) BP: 116/76 mmHg (05/12 1120) Pulse Rate: 83  (05/12 1120)  Labs:  Basename 06/09/11 0500 06/08/11 1050 06/08/11 0505 06/07/11 0805  HGB -- 13.5 -- --  HCT -- 39.1 -- --  PLT -- 189 -- --  APTT -- -- -- --  LABPROT 26.0* -- 32.5* 38.4*  INR 2.34* -- 3.11* 3.85*  HEPARINUNFRC -- -- -- --  CREATININE 0.56 -- 0.52 --  CKTOTAL -- -- -- --  CKMB -- -- -- --  TROPONINI -- -- -- --    Estimated Creatinine Clearance: 129.4 ml/min (by C-G formula based on Cr of 0.56).   Medications:  Scheduled:    . baclofen  20 mg Oral QID  . diazepam  2 mg Oral Q12H  . hydrochlorothiazide  25 mg Oral Daily  . metoprolol succinate  100 mg Oral BID  . mulitivitamin with minerals  1 tablet Oral Daily  . polyethylene glycol  17 g Oral Daily  . Warfarin - Pharmacist Dosing Inpatient   Does not apply q1800  . DISCONTD: bisacodyl  10 mg Rectal BID  . DISCONTD: diazepam  5 mg Intravenous Once   Infusions:    . DISCONTD: dextrose 5 % and 0.9 % NaCl with KCl 40 mEq/L 50 mL/hr at 06/09/11 1041   Anti-infectives    None     Assessment:30yo M tx from [facility] on 5/9 with cerebral palsy admitted with abd distention, constipation, N/V, and small amount of blood tinged emesis found to have SBO. CT revealed L groin mass, [facility] has seen patient and plan is to perform CTA to further delineate etiology of mass (possibly, pseudoaneursym, AV fistula, venous varix). Pharmacy consulted for Coumadin dosing for h/o LLE DVT.  INR this am at 2.34, last dose of coumadin 4mg  on 5/6. No bleeding documented.  Home dose is 4mg  MWF, 2mg  other  days    Goal of Therapy:  INR 2-3 Monitor platelets by anticoagulation protocol: Yes   Plan:  Coumadin 3 mg today Daily INR  Lucille Passy 06/09/2011,2:03 PM

## 2011-06-10 DIAGNOSIS — K56609 Unspecified intestinal obstruction, unspecified as to partial versus complete obstruction: Secondary | ICD-10-CM

## 2011-06-10 DIAGNOSIS — R1900 Intra-abdominal and pelvic swelling, mass and lump, unspecified site: Secondary | ICD-10-CM

## 2011-06-10 DIAGNOSIS — R1033 Periumbilical pain: Secondary | ICD-10-CM

## 2011-06-10 DIAGNOSIS — G808 Other cerebral palsy: Secondary | ICD-10-CM

## 2011-06-10 MED ORDER — GLYCERIN (LAXATIVE) 2.1 G RE SUPP
1.0000 | Freq: Every day | RECTAL | Status: DC
  Filled 2011-06-10: qty 1

## 2011-06-10 MED ORDER — POLYETHYLENE GLYCOL 3350 17 G PO PACK: 17.0000 g | PACK | Freq: Every day | ORAL | Status: AC

## 2011-06-10 MED ORDER — WARFARIN SODIUM 5 MG PO TABS
5.0000 mg | ORAL_TABLET | Freq: Once | ORAL | Status: AC
  Administered 2011-06-10: 5 mg via ORAL
  Filled 2011-06-10: qty 1

## 2011-06-10 MED ORDER — DIAZEPAM 2 MG PO TABS: 2.0000 mg | ORAL_TABLET | Freq: Two times a day (BID) | ORAL | Status: DC

## 2011-06-10 MED ORDER — STARCH (THICKENING) PO POWD: ORAL | Status: DC | PRN

## 2011-06-10 MED ORDER — POLYETHYLENE GLYCOL 3350 17 G PO PACK
17.0000 g | PACK | Freq: Two times a day (BID) | ORAL | Status: DC
  Filled 2011-06-10: qty 1

## 2011-06-10 MED ORDER — POLYETHYLENE GLYCOL 3350 17 G PO PACK
17.0000 g | PACK | Freq: Every day | ORAL | Status: DC
  Administered 2011-06-10: 17 g via ORAL
  Filled 2011-06-10: qty 1

## 2011-06-10 NOTE — Progress Notes (Signed)
Patient ID: [name], male   DOB: [birth year], 31 y.o.   MRN: [id number]    Subjective: Pt feels well.  Tolerating regular diet.  + flatus, last BM 2 days ago.  Objective: Vital signs in last 24 hours: Temp:  [97.4 F (36.3 C)-97.9 F (36.6 C)] 97.4 F (36.3 C) (05/13 0605) Pulse Rate:  [76-83] 76  (05/13 0605) Resp:  [18-19] 18  (05/13 0605) BP: (111-137)/(75-85) 137/81 mmHg (05/13 0605) SpO2:  [94 %-100 %] 94 % (05/13 0605) Last BM Date: 06/08/11  Intake/Output from previous day: 05/12 0701 - 05/13 0700 In: 1967.5 [P.O.:530; I.V.:1437.5] Out: 1000 [Urine:1000] Intake/Output this shift:    PE: Abd: soft, NT, ND, +BS  Lab Results:   Basename 06/08/11 1050  WBC 5.3  HGB 13.5  HCT 39.1  PLT 189   BMET  Basename 06/09/11 0500 06/08/11 0505  NA 140 139  K 4.1 3.6  CL 105 105  CO2 26 25  GLUCOSE 99 102*  BUN <3* <3*  CREATININE 0.56 0.52  CALCIUM 8.6 8.4   PT/INR  Basename 06/10/11 0650 06/09/11 0500  LABPROT 17.9* 26.0*  INR 1.45 2.34*   CMP     Component Value Date/Time   NA 140 06/09/2011 0500   K 4.1 06/09/2011 0500   CL 105 06/09/2011 0500   CO2 26 06/09/2011 0500   GLUCOSE 99 06/09/2011 0500   BUN <3* 06/09/2011 0500   CREATININE 0.56 06/09/2011 0500   CALCIUM 8.6 06/09/2011 0500   PROT 7.8 06/03/2011 1015   ALBUMIN 4.4 06/03/2011 1015   AST 21 06/03/2011 1015   ALT 17 06/03/2011 1015   ALKPHOS 72 06/03/2011 1015   BILITOT 1.3* 06/03/2011 1015   GFRNONAA >90 06/09/2011 0500   GFRAA >90 06/09/2011 0500   Lipase     Component Value Date/Time   LIPASE 16 06/03/2011 1015       Studies/Results: Dg Abd Acute W/chest  06/09/2011  *RADIOLOGY REPORT*  Clinical Data: Followup partial small bowel obstruction  ACUTE ABDOMEN SERIES (ABDOMEN 2 VIEW & CHEST 1 VIEW)  Comparison: None.  Findings: Left-sided PICC line in place with tip in distal SVC. Stable enlarged heart silhouette.  Basilar atelectasis.  There is one dilated loop of small bowel in the right abdomen  measuring 4.7 cm.  No fluid levels.  There is gas throughout the colon and within the rectum.  No intraperitoneal free air.  IMPRESSION:  1.  No evidence of small bowel obstruction. 2.  Single dilated loop of small bowel in the right abdomen. 3.  Basilar atelectasis.  Original Report Authenticated By: Genevive Bi, M.D.   Dg Abd Portable 2v  06/08/2011  *RADIOLOGY REPORT*  Clinical Data: Assess for small bowel obstruction  PORTABLE ABDOMEN - 2 VIEW  Comparison: 06/05/2011 and CT scan 06/07/2011  Findings: Again noted gaseous distended small bowel loops suspicious for ileus or partial bowel obstruction.  There is slight improvement from prior x-ray 05/08/ 13. No definite free air noted on decubitus view.  IMPRESSION:  Again noted gaseous distended small bowel loops suspicious for ileus or partial bowel obstruction.  There is slight improvement from prior x-ray 05/08/ 13. No definite free air noted on decubitus view.  Original Report Authenticated By: Natasha Mead, M.D.    Anti-infectives: Anti-infectives    None       Assessment/Plan  1. SBO, resolved  Plan: 1. Patient is feeling well and tolerating a regular diet.  He is stable surgically for dc from our standpoint.  LOS: 7 days    Ivyrose Hashman E 06/10/2011

## 2011-06-10 NOTE — Discharge Summary (Signed)
PATIENT DETAILS Name: [name] Age: 31 y.o. Sex: male Date of Birth: Sep 25, 1980 MRN: [id number]. 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 Hypertension   . Cerebral palsy   . Recurrent UTI   . Coagulopathy   . Insomnia   . Tachycardia   . Chronic constipation     DISCHARGE MEDICATIONS: Medication List  As of 06/10/2011 10:46 AM   TAKE these medications         acetaminophen 325 MG tablet   Commonly known as: TYLENOL   Take 650 mg by mouth every 6 (six) hours as needed. For pain        baclofen 20 MG tablet   Commonly known as: LIORESAL   Take 20 mg by mouth 4 (four) times daily.      calcium carbonate 500 MG chewable tablet   Commonly known as: TUMS - dosed in mg elemental calcium   Chew 1 tablet by mouth 3 (three) times daily.      cholecalciferol 1000 UNITS tablet   Commonly known as: VITAMIN D   Take 1,000 Units by mouth 2 (two) times daily.      cyclobenzaprine 10 MG tablet   Commonly known as: FLEXERIL   Take 10 mg by mouth 3 (three) times daily.      diazepam 2 MG tablet   Commonly known as: VALIUM   Take 1 tablet (2 mg total) by mouth every 12 (twelve) hours.      hydrochlorothiazide 25 MG tablet   Commonly known as: HYDRODIURIL   Take 25 mg by mouth daily.      loperamide 2 MG capsule   Commonly known as: IMODIUM   Take 2 mg by mouth 3 (three) times daily as needed. For diarrhea      metoprolol succinate 100 MG 24 hr tablet   Commonly known as: TOPROL-XL   Take 100 mg by mouth daily.      mulitivitamin with minerals Tabs   Take 1 tablet  by mouth daily.      nitrofurantoin 50 MG capsule   Commonly known as: MACRODANTIN   Take 50 mg by mouth daily.      polyethylene glycol packet   Commonly known as: MIRALAX / GLYCOLAX   Take 17 g by mouth daily.      SANI-SUPP ADULT RE   Place rectally as needed. For constipation      sodium phosphate enema   Commonly known as: FLEET   Place 1 enema rectally once. follow package directions      warfarin 4 MG tablet   Commonly known as: COUMADIN   Take 4 mg by mouth. Monday Wednesday and Friday      warfarin 2 MG tablet   Commonly known as: COUMADIN   Take 2 mg by mouth daily. Tuesday Thursday Saturday and Sunday ONLY             BRIEF HPI:  See H&P, Labs, Consult and Test reports for all details in brief, Pt is 31 year old male with a history of cerebral palsy and with some intellectual disability who also has chronic  constipation as well as intermittent loose stools now presents with several hours apparently of abdominal distention, diffuse abdominal pain, as well as vomiting with some small blood-tinged component of the emesis without vomiting gross blood or coffee ground emesis, and constipation of unknown duration. Pt denies shortness of breath, chest pain, fevers, chills, no other systemic concerns. Most of the history was obtained from mother who was present at bedside. She reports that pt had SBO several years ago that has required surgical intervention.   CONSULTATIONS:   general surgery and vascular surgery  PERTINENT RADIOLOGIC STUDIES: Ct Abdomen Pelvis W Contrast  06/04/2011  *RADIOLOGY REPORT*  Clinical Data: Persistent small bowel obstruction with nausea vomiting.  CT ABDOMEN AND PELVIS WITH CONTRAST  Technique:  Multidetector CT imaging of the abdomen and pelvis was performed following the standard protocol during bolus administration of intravenous contrast.  Contrast: OMNIPAQUE IOHEXOL 300 MG/ML  SOLN  Comparison: 08/16/2009  Findings: Patchy airspace  disease in the posterior left lower lobe is compatible with atelectasis or infiltrate.  No focal abnormalities seen in the liver or spleen.  The stomach, duodenum, pancreas, and adrenal glands are unremarkable.  Calcified stones are seen in the lumen of the gallbladder.  Tiny cortical cyst noted in the left kidney.  Right kidney is unremarkable.  There is diffuse fluid filled dilated small bowel measuring up to 5.5 cm in diameter. The the patient has had a right hemicolectomy and ileocolic anastomoses is seen in the right upper quadrant of the abdomen.  Unfortunately, the patient moved while images were being obtained through this region and the motion artifact degrades the image quality this region.  Nevertheless, there does appear to be a transition zone from a markedly dilated loop anterior to the liver to a completely collapsed loop leading into the entero-colic anastomosis.  There is some fluid and gas in the colon diffusely down to the level of the rectum.  The superior mesenteric artery and vein are patent.  Portal vein is patent.  No abdominal aortic aneurysm.  The SMA and IMA are patent.  Imaging through the pelvis shows no substantial intraperitoneal free fluid.  In the left groin, there is a 3.7 x 2.7 cm irregular vascular lesion which may be a pseudoaneurysm, AV fistula, or venous varix.  There is prominent collateralization in the subcutaneous tissues of the lower left anterior abdominal wall.  Bone windows reveal no worrisome lytic or sclerotic osseous lesions.  IMPRESSION: Diffusely dilated small bowel ( measuring up to 5.5 cm in diameter) tracks up to an apparent transition zone just proximal to the ileocolic anastomoses.  Imaging features suggest mechanical obstruction, potentially secondary to adhesion. Small bowel stricture proximal to the anastomosis would also be a consideration.  Approximately 4 cm vascular "mass" in the left groin region.  Both the common femoral artery and vein track in/or  through this structure.  Given the lack of differential enhancement between the arterial and venous anatomy on this study, it is difficult to determine the etiology of this finding.  Differential considerations would include a pseudoaneurysm, AV fistula, or venous varix.  A focused ultrasound exam may prove helpful to further evaluate.  I personally discussed the results of this study with K. Schorr, mid-level for the hospitalist service, at 2019 hours on 06/04/2011.  Original Report Authenticated By: ERIC A. MANSELL, M.D.   Ct Angio Ao+bifem W/cm &/or Wo/cm  06/07/2011  *RADIOLOGY REPORT*  Clinical data: Left groin mass.  CT ANGIOGRAM ABDOMINAL AORTA AND BILATERAL LOWER  EXTREMITIES WITH CONTRAST  Technique:  Axial helical CT of the abdominal aorta and bilateral lower extremities after Optiray 350 IV.  Coronal and sagittal reconstructions were generated for vascular evaluation.  Comparison:  06/04/2011  Arterial findings: Aorta:                  Normal in caliber and contour without significant atheromatous irregularity, dissection, aneurysm, or stenosis.  Celiac axis:            Unremarkable  Superior mesenteric:Unremarkable, with classic distal branching anatomy.  Left renal:             Single, widely patent.  Right renal:            Single, patent proximally.  The distal aspect is degraded by patient motion.  Inferior mesenteric:Unremarkable.  Left iliac:             Mildly ectatic throughout, without focal lesion.  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 This has not significantly changed in size or appearance since scans dating back to 09/28/2008. The common femoral artery is mildly dilated.  Distal SFA and profunda femoris branches unremarkable.  Patent popliteal artery. There is significant venous opacification distally  limiting assessment of trifurcation runoff vessels.  Right lower extremity: Common femoral, profunda femoris, superficial femoral, and popliteal arteries unremarkable.  There is significant venous opacification distally, limiting assessment of trifurcation runoff vessels.  Venous findings:  There is marked dilatation of the  left common femoral vein at the level of the AV malformation.  There is distention of the collaterals draining veins extending into the pelvis and in the anterior body wall. No evidence of mesenteric venous involvement. 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 Right iliac venous system, IVC, bilateral renal veins, portal vein, and visualized portions of the hepatic veins are patent.  Review of the MIP images confirms the above findings.  Nonvascular findings: Visualized portions of lung bases unremarkable.  Unremarkable visualized portions of the liver and spleen, adrenal glands, kidneys, pancreas.  At least two partially calcified stones layer in the dependent aspect of the gallbladder, largest measuring 1 cm in maximal transverse diameter.  No ascites. No free air.  Stomach and proximal small bowel decompressed.  There is an anastomotic staple line in the right lower quadrant with circumferential wall thickening and mild distention of the associated small bowel loops, which has progressed since the previous study.  The small bowel dilatation seen previously has resolved.  The colon is nondilated.  Urinary bladder incompletely distended.  Mild prostatic enlargement.  No adenopathy localized.  IMPRESSION:  1.  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 No  change in appearance since 09/28/2008.  Probably congenital. 2.  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 3.  Cholelithiasis. 4.  Interval decrease in small bowel dilatation. There is significant circumferential wall thickening in distal small bowel loops.  Original Report Authenticated By: Lysle Rubens  HASSELL III, M.D.   Ir Fluoro Guide Cv Line Right  06/06/2011  *RADIOLOGY REPORT*  Clinical Data: Infection, access for IV antibiotics  PICC LINE PLACEMENT WITH ULTRASOUND AND FLUOROSCOPIC  GUIDANCE  Fluoroscopy Time: 0.4 minutes.  The right arm was prepped with chlorhexidine, draped in the usual sterile fashion using maximum barrier technique (cap and mask, sterile gown, sterile gloves, large sterile sheet, hand hygiene and cutaneous antisepsis) and infiltrated locally with 1% Lidocaine.  Ultrasound demonstrated patency of the right brachial vein, and this was documented with an image.  Under real-time ultrasound guidance, this vein was accessed with a 21 gauge micropuncture needle and image documentation was performed.  The needle was exchanged over a guidewire for a peel-away sheath through which a 5 [place] double lumen PICC trimmed to 39 cm was advanced, positioned with its tip at the lower SVC/right atrial junction.  Fluoroscopy during the procedure and fluoro spot radiograph confirms appropriate catheter position.  The catheter was flushed, secured to the skin with Prolene sutures, and covered with a sterile dressing.  Complications:  No immediate  IMPRESSION: Successful right arm PICC line placement with ultrasound and fluoroscopic guidance.  The catheter is ready for use.  Original Report Authenticated By: Judie Petit. Ruel Favors, M.D.   Ir US Guide Vasc Access Right  06/06/2011  *RADIOLOGY REPORT*  Clinical Data: Infection, access for IV antibiotics  PICC LINE PLACEMENT WITH ULTRASOUND AND FLUOROSCOPIC  GUIDANCE  Fluoroscopy  Time: 0.4 minutes.  The right arm was prepped with chlorhexidine, draped in the usual sterile fashion using maximum barrier technique (cap and mask, sterile gown, sterile gloves, large sterile sheet, hand hygiene and cutaneous antisepsis) and infiltrated locally with 1% Lidocaine.  Ultrasound demonstrated patency of the right brachial vein, and this was documented with an image.  Under real-time ultrasound guidance, this vein was accessed with a 21 gauge micropuncture needle and image documentation was performed.  The needle was exchanged over a guidewire for a peel-away sheath through which a 5 [place] double lumen PICC trimmed to 39 cm was advanced, positioned with its tip at the lower SVC/right atrial junction.  Fluoroscopy during the procedure and fluoro spot radiograph confirms appropriate catheter position.  The catheter was flushed, secured to the skin with Prolene sutures, and covered with a sterile dressing.  Complications:  No immediate  IMPRESSION: Successful right arm PICC line placement with ultrasound and fluoroscopic guidance.  The catheter is ready for use.  Original Report Authenticated By: Judie Petit. Ruel Favors, M.D.   Dg Abd Acute W/chest  06/09/2011  *RADIOLOGY REPORT*  Clinical Data: Followup partial small bowel obstruction  ACUTE ABDOMEN SERIES (ABDOMEN 2 VIEW & CHEST 1 VIEW)  Comparison: None.  Findings: Left-sided PICC line in place with tip in distal SVC. Stable enlarged heart silhouette.  Basilar atelectasis.  There is one dilated loop of small bowel in the right abdomen measuring 4.7 cm.  No fluid levels.  There is gas throughout the colon and within the rectum.  No intraperitoneal free air.  IMPRESSION:  1.  No evidence of small bowel obstruction. 2.  Single dilated loop of small bowel in the right abdomen. 3.  Basilar atelectasis.  Original Report Authenticated By: Genevive Bi, M.D.   Dg Abd Acute W/chest  06/04/2011  *RADIOLOGY REPORT*  Clinical Data: Small bowel obstruction,  abdominal pain, follow-up  ACUTE ABDOMEN SERIES (ABDOMEN 2 VIEW & CHEST 1 VIEW)  Comparison: 06/03/2011  Findings: Upper normal heart size. Stable mediastinal contours. Slight rotation to the left.  Bibasilar atelectasis greater on left. No definite infiltrate or effusion. Cervicothoracic scoliosis.  Persistent dilatation of small bowel loops though some gas is present within the colon and rectum. Findings remain most consistent with small bowel obstruction. Ovoid calcifications right upper quadrant consistent with cholelithiasis. No definite free intraperitoneal air or bowel wall thickening. No acute osseous findings.  IMPRESSION: Persistent small bowel dilatation consistent with small bowel obstruction. Cause of bowel obstruction is uncertain; consider CT imaging with IV and potentially oral contrast to assess. No definite evidence of perforation or bowel wall thickening. Cholelithiasis. Bibasilar atelectasis greater on the left.  Original Report Authenticated By: Lollie Marrow, M.D.   Dg Abd Acute W/chest  06/03/2011  *RADIOLOGY REPORT*  Clinical Data: Abdominal pain, vomiting  ACUTE ABDOMEN SERIES (ABDOMEN 2 VIEW & CHEST 1 VIEW)  Comparison: 08/27/2009  Findings: The cardiomediastinal silhouette is stable.  No convincing pulmonary edema. Bilateral basilar atelectasis or infiltrate.  Significant gaseous distended small bowel loops with multiple air- fluid levels best seen on decubitus view highly suspicious for small bowel obstruction.  No free abdominal air.  IMPRESSION: Bilateral basilar atelectasis or infiltrate.  Significant gaseous distended small bowel loops with air-fluid levels highly suspicious for small bowel obstruction.  Original Report Authenticated By: Natasha Mead, M.D.   Dg Abd Portable 2v  06/08/2011  *RADIOLOGY REPORT*  Clinical Data: Assess for small bowel obstruction  PORTABLE ABDOMEN - 2 VIEW  Comparison: 06/05/2011 and CT scan 06/07/2011  Findings: Again noted gaseous distended small bowel  loops suspicious for ileus or partial bowel obstruction.  There is slight improvement from prior x-ray 05/08/ 13. No definite free air noted on decubitus view.  IMPRESSION:  Again noted gaseous distended small bowel loops suspicious for ileus or partial bowel obstruction.  There is slight improvement from prior x-ray 05/08/ 13. No definite free air noted on decubitus view.  Original Report Authenticated By: Natasha Mead, M.D.   Dg Abd Portable 2v  06/05/2011  *RADIOLOGY REPORT*  Clinical Data: Small bowel obstruction  PORTABLE ABDOMEN - 2 VIEW  Comparison: 06/04/2011  Findings: No free air on the left lateral decubitus radiograph. There are dilated small bowel loops in the mid abdomen with fluid levels on the decubitus radiograph.  The colon appears nondilated. Regional bones unremarkable.  Left pelvic phleboliths.  IMPRESSION:  1.  Persistent mid to distal small bowel obstruction. 2.  No free air.  Original Report Authenticated By: Osa Craver, M.D.     PERTINENT LAB RESULTS: CBC:  Basename 06/08/11 1050  WBC 5.3  HGB 13.5  HCT 39.1  PLT 189   CMET CMP     Component Value Date/Time   NA 140 06/09/2011 0500   K 4.1 06/09/2011 0500   CL 105 06/09/2011 0500   CO2 26 06/09/2011 0500   GLUCOSE 99 06/09/2011 0500   BUN <3* 06/09/2011 0500   CREATININE 0.56 06/09/2011 0500   CALCIUM 8.6 06/09/2011 0500   PROT 7.8 06/03/2011 1015   ALBUMIN 4.4 06/03/2011 1015   AST 21 06/03/2011 1015   ALT 17 06/03/2011 1015   ALKPHOS 72 06/03/2011 1015   BILITOT 1.3* 06/03/2011 1015   GFRNONAA >90 06/09/2011 0500   GFRAA >90 06/09/2011 0500    GFR Estimated Creatinine Clearance: 129.4 ml/min (by C-G formula based on Cr of 0.56). No results found for this basename: LIPASE:2,AMYLASE:2 in the last 72 hours No results found for this basename: CKTOTAL:3,CKMB:3,CKMBINDEX:3,TROPONINI:3 in the last 72 hours No components found with this basename: POCBNP:3  No results found for this basename: DDIMER:2 in the last 72  hours No results found for this basename: HGBA1C:2 in the last 72 hours No results found for this basename: CHOL:2,HDL:2,LDLCALC:2,TRIG:2,CHOLHDL:2,LDLDIRECT:2 in the last 72 hours No results found for this basename: TSH,T4TOTAL,FREET3,T3FREE,THYROIDAB in the last 72 hours No results found for this basename: VITAMINB12:2,FOLATE:2,FERRITIN:2,TIBC:2,IRON:2,RETICCTPCT:2 in the last 72 hours Coags:  Basename 06/10/11 0650 06/09/11 0500  INR 1.45 2.34*   Microbiology: No results found for this or any previous visit (from the past 240 hour(s)).   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  INR today is 1.45, We will give him 5mg  of Coumadin for one dose prior to d/c and then resume his home dosing of coumadin from tomorrow. He will need INR checked 06/12/11  TODAY-DAY OF DISCHARGE:  Subjective:   Martell Mcfadyen today has no headache,no chest abdominal pain,no new weakness tingling or numbness, feels much better.   Objective:   Blood pressure 137/81, pulse 76, temperature 97.4 F (36.3 C), temperature source Axillary, resp. rate 18, height  5\' 8"  (1.727 m), weight 81.647 kg (180 lb), SpO2 94.00%.  Intake/Output Summary (Last 24 hours) at 06/10/11 1046 Last data filed at 06/10/11 0600  Gross per 24 hour  Intake 1727.5 ml  Output   1000 ml  Net  727.5 ml    Exam Awake Alert, Oriented *3, No new F.N deficits, Normal affect [place].AT,PERRAL Supple Neck,No JVD, No cervical lymphadenopathy appriciated.  Symmetrical Chest wall movement, Good air movement bilaterally, CTAB RRR,No Gallops,Rubs or new Murmurs, No Parasternal Heave +ve B.Sounds, Abd Soft, Non tender, No organomegaly appriciated, No rebound -guarding or rigidity. No Cyanosis, Clubbing or edema, No new Rash or bruise  DISPOSITION: Back to [facility]   DISCHARGE INSTRUCTIONS:     1. INR needs to be checked on 06/12/11  2. Diet: Dys 3 Diet Follow-up Information    Follow up with Florentina Jenny, MD. Schedule an appointment as soon as possible for a visit in 1 week.   Contact information:   [address] [place]. [address] [place] [address] [phone]       Follow up with Florentina Jenny, MD.   Contact information:   [address] [place]. [address] [place] [address] [phone]          Total Time spent on discharge equals 45 minutes.  SignedJeoffrey Massed 06/10/2011 10:46 AM

## 2011-06-10 NOTE — Progress Notes (Addendum)
Clinical Social Worker faxed [facility] pt discharge information. Clinical Child psychotherapist received confirmation from [facility] that pt can return. Clinical Social Worker spoke with pt and pt caregivers at bedside and pt request to eat dinner before returning to [facility]. Clinical Social Worker contacted [facility] and asked if pt can return following dinner and facility agreeable. Clinical Social Worker notified RN who agrees to set up non-emergency ambulance once pt finishes dinner, after hours non-emergency ambulance phone number is [phone]. Clinical Social Worker contacted pt mother with pt permission to notify. [facility] request that pt RN contacts facility once ambulance arrives to transport patient, phone number to [facility] is [phone]. No further social work needs identified at this time. RN to arrange ambulance transportation when pt completes dinner.   Addendum 6:00pm- Clinical Social Worker arranged ambulance transportation for pt to return to [facility]. Pt and pt caregiver at bedside notified. No further social work needs identified. Clinical Social Worker signing off.   Jacklynn Lewis, MSW, LCSWA  Clinical Social Work [phone]

## 2011-06-10 NOTE — Progress Notes (Signed)
ANTICOAGULATION CONSULT NOTE - Follow Up Consult  Pharmacy Consult for Coumadin Indication: Hx DVT  Allergies  Allergen Reactions  . Betadine (Povidone Iodine)   . Ciprofloxacin   . Morphine     Patient Measurements: Height: 5\' 8"  (172.7 cm) Weight: 180 lb (81.647 kg) IBW/kg (Calculated) : 68.4    Vital Signs: Temp: 97.4 F (36.3 C) (05/13 0605) Temp src: Axillary (05/13 0605) BP: 137/81 mmHg (05/13 0605) Pulse Rate: 76  (05/13 0605)  Labs:  Basename 06/10/11 0650 06/09/11 0500 06/08/11 1050 06/08/11 0505  HGB -- -- 13.5 --  HCT -- -- 39.1 --  PLT -- -- 189 --  APTT -- -- -- --  LABPROT 17.9* 26.0* -- 32.5*  INR 1.45 2.34* -- 3.11*  HEPARINUNFRC -- -- -- --  CREATININE -- 0.56 -- 0.52  CKTOTAL -- -- -- --  CKMB -- -- -- --  TROPONINI -- -- -- --    Estimated Creatinine Clearance: 129.4 ml/min (by C-G formula based on Cr of 0.56).   Medications:  Scheduled:     . baclofen  20 mg Oral QID  . diazepam  2 mg Oral Q12H  . Glycerin (Adult)  1 suppository Rectal QHS  . hydrochlorothiazide  25 mg Oral Daily  . metoprolol succinate  100 mg Oral BID  . mulitivitamin with minerals  1 tablet Oral Daily  . polyethylene glycol  17 g Oral Daily  . warfarin  3 mg Oral ONCE-1800  . warfarin  5 mg Oral Once  . Warfarin - Pharmacist Dosing Inpatient   Does not apply q1800  . DISCONTD: bisacodyl  10 mg Rectal BID  . DISCONTD: diazepam  5 mg Intravenous Once  . DISCONTD: polyethylene glycol  17 g Oral Daily  . DISCONTD: polyethylene glycol  17 g Oral BID   Infusions:     . DISCONTD: dextrose 5 % and 0.9 % NaCl with KCl 40 mEq/L 50 mL/hr at 06/09/11 1041   Anti-infectives    None     Assessment: INR drop today to 1.45  Planning home  Goal of Therapy:  INR 2-3 Monitor platelets by anticoagulation protocol: Yes   Plan:  Coumadin 5 mg now Resume home dose Coumadin 4 mg MWF, 2 mg other days Check INR Wednesday  Thank you.  Elwin Sleight 06/10/2011,9:46  AM

## 2011-06-10 NOTE — Progress Notes (Signed)
[facility] call requesting discharge info to be faxed I called [employer] social worker left detailed message. [facility] contact numbers [phone] and fax [phone] [place].

## 2011-06-10 NOTE — Progress Notes (Signed)
Family and patient requested that patient not be sat up in order to receive diet or medications.  The risks of aspiration were discussed with family and patient.  They acknowledged understanding of risks but insist that he not be sat up per patient comfort.  Will continue to monitor. Glenna Brunkow, Joan Mayans, RN

## 2011-06-12 NOTE — Progress Notes (Signed)
Problem resolved  [name]. Gae Bon, MD, FACS [phone] [facility]

## 2011-11-13 ENCOUNTER — Encounter (HOSPITAL_COMMUNITY): Payer: Self-pay

## 2011-11-14 ENCOUNTER — Encounter (HOSPITAL_COMMUNITY): Payer: Self-pay

## 2011-11-14 ENCOUNTER — Encounter (HOSPITAL_COMMUNITY)
Admission: RE | Admit: 2011-11-14 | Discharge: 2011-11-14 | Disposition: A | Discharge status: Home / Self Care | Payer: 59 | Source: Ambulatory Visit | Attending: Dentistry | Admitting: Dentistry

## 2011-11-14 ENCOUNTER — Other Ambulatory Visit (HOSPITAL_COMMUNITY): Payer: Self-pay | Admitting: Dentistry

## 2011-11-14 DIAGNOSIS — K0889 Other specified disorders of teeth and supporting structures: Secondary | ICD-10-CM

## 2011-11-14 LAB — BASIC METABOLIC PANEL
Chloride: 103 mEq/L (ref 96–112)
Creatinine, Ser: 0.97 mg/dL (ref 0.50–1.35)
GFR calc Af Amer: >90 mL/min (ref >90)
Potassium: 4.8 mEq/L (ref 3.5–5.1)

## 2011-11-14 LAB — CBC
HCT: 48.8 % (ref 39.0–52.0)
Hemoglobin: 16.7 g/dL (ref 13.0–17.0)
RDW: 13.8 % (ref 11.5–15.5)
WBC: 6.9 10*3/uL (ref 4.0–10.5)

## 2011-11-14 NOTE — Pre-Procedure Instructions (Signed)
20 Eliab Closson  11/14/2011   Your procedure is scheduled on:  Friday November 22, 2011  Report to [facility] at 6:00 AM.  Call this number if you have problems the morning of surgery: [phone]   Remember:   Do not eat food drink :After Midnight.      Take these medicines the morning of surgery with A SIP OF WATER: baclofen, valium, metoprolol,   Do not wear jewelry, make-up or nail polish.  Do not wear lotions, powders, or perfumes.   Do not shave 48 hours prior to surgery. Men may shave face and neck.  Do not bring valuables to the hospital.  Contacts, dentures or bridgework may not be worn into surgery.  Leave suitcase in the car. After surgery it may be brought to your room.  For patients admitted to the hospital, checkout time is 11:00 AM the day of discharge.   Patients discharged the day of surgery will not be allowed to drive home.  Name and phone number of your driver: [facility]   Special Instructions: Shower using CHG 2 nights before surgery and the night before surgery.  If you shower the day of surgery use CHG.  Use special wash - you have one bottle of CHG for all showers.  You should use approximately 1/3 of the bottle for each shower.   Please read over the following fact sheets that you were given: Pain Booklet, Coughing and Deep Breathing and Surgical Site Infection Prevention

## 2011-11-14 NOTE — Progress Notes (Signed)
Forwarded chart to anesthesia to review EKG.

## 2011-11-14 NOTE — Progress Notes (Signed)
Called Dr. Rachelle Hora office, left message request preop order for consent for treatment.

## 2011-11-15 NOTE — Consult Note (Signed)
Anesthesia chart review: Patient is a 31 year old male posted for dental restorations and cleaning (no extractions) on 11/22/2011 by Dr. Lexine Baton.  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   EKG on 11/14/11 showed normal sinus rhythm, left atrial enlargement, incomplete right bundle branch block, right ventricular hypertrophy with repolarization abnormality, possible lateral infarct, age undetermined, inferior infarct, age undetermined. It was not felt significantly changed from his previous tracing from 09/12/2010.  It also does not look significantly changed from his last pre-operative (dental procedure) EKG from 05/30/09.  Echo on 09/13/10 showed: LV with normal cavity size, wall thickness was normal, systolic function was normal, EF 55-60%. Pulmonary arteries with PACs pressures 34 mmHg (S). Trivial tricuspid regurgitation.  1V CXR on 11/14/2011 showed low lung by with crowding of the bronchovascular markings. No worrisome or acute cardiopulmonary abnormality suggested.  Labs noted.  He is on Coumadin, so he will need a PT/PTT on arrival.  Overall, I think his EKG is stable for > 2 years.  He had normal LV systolic function and EF by echo last year.  If his follow-up labs are reasonable and no acute changes in his status, then anticipate he can proceed as planned.  I've asked the Short Stay nursing staff to follow-up on obtaining a pre-operative H&P.   Shonna Chock, PA-C

## 2011-11-18 ENCOUNTER — Other Ambulatory Visit: Payer: Self-pay | Admitting: Dentistry

## 2011-11-18 NOTE — Progress Notes (Signed)
REQUESTED H+P FROM DR. HISAW'S OFFICE.

## 2011-11-22 ENCOUNTER — Encounter (HOSPITAL_COMMUNITY): Payer: Self-pay | Admitting: *Deleted

## 2011-11-22 ENCOUNTER — Encounter (HOSPITAL_COMMUNITY): Payer: Self-pay | Admitting: Vascular Surgery

## 2011-11-22 ENCOUNTER — Ambulatory Visit (HOSPITAL_COMMUNITY): Payer: 59 | Admitting: Vascular Surgery

## 2011-11-22 ENCOUNTER — Ambulatory Visit (HOSPITAL_COMMUNITY)
Admission: RE | Admit: 2011-11-22 | Discharge: 2011-11-22 | Disposition: A | Discharge status: Skilled Nursing Facility | Payer: 59 | Source: Ambulatory Visit | Attending: Dentistry | Admitting: Dentistry

## 2011-11-22 ENCOUNTER — Encounter (HOSPITAL_COMMUNITY)
Admission: RE | Disposition: A | Discharge status: Skilled Nursing Facility | Payer: Self-pay | Source: Ambulatory Visit | Attending: Dentistry

## 2011-11-22 DIAGNOSIS — K029 Dental caries, unspecified: Secondary | ICD-10-CM | POA: Insufficient documentation

## 2011-11-22 DIAGNOSIS — I10 Essential (primary) hypertension: Secondary | ICD-10-CM | POA: Insufficient documentation

## 2011-11-22 DIAGNOSIS — G809 Cerebral palsy, unspecified: Secondary | ICD-10-CM | POA: Insufficient documentation

## 2011-11-22 HISTORY — PX: TOOTH EXTRACTION: SHX859

## 2011-11-22 LAB — PROTIME-INR
INR: 1.39 (ref 0.00–1.49)
Prothrombin Time: 16.7 seconds — ABNORMAL HIGH (ref 11.6–15.2)

## 2011-11-22 LAB — APTT: aPTT: 29 seconds (ref 24–37)

## 2011-11-22 SURGERY — DENTAL RESTORATION/EXTRACTIONS
Anesthesia: General | Site: Mouth | Wound class: Clean Contaminated

## 2011-11-22 MED ORDER — ACETAMINOPHEN 10 MG/ML IV SOLN
INTRAVENOUS | Status: AC
  Filled 2011-11-22: qty 100

## 2011-11-22 MED ORDER — FENTANYL CITRATE 0.05 MG/ML IJ SOLN
INTRAMUSCULAR | Status: DC | PRN
  Administered 2011-11-22 (×2): 50 ug via INTRAVENOUS

## 2011-11-22 MED ORDER — FENTANYL CITRATE 0.05 MG/ML IJ SOLN: 50.0000 ug | Freq: Once | INTRAMUSCULAR | Status: DC

## 2011-11-22 MED ORDER — MIDAZOLAM HCL 2 MG/2ML IJ SOLN: 1.0000 mg | INTRAMUSCULAR | Status: DC | PRN

## 2011-11-22 MED ORDER — PROPOFOL 10 MG/ML IV BOLUS
INTRAVENOUS | Status: DC | PRN
  Administered 2011-11-22: 60 mg via INTRAVENOUS
  Administered 2011-11-22: 30 mg via INTRAVENOUS

## 2011-11-22 MED ORDER — ACETAMINOPHEN 10 MG/ML IV SOLN
1000.0000 mg | Freq: Once | INTRAVENOUS | Status: AC
  Administered 2011-11-22: 1000 mg via INTRAVENOUS
  Filled 2011-11-22: qty 100

## 2011-11-22 MED ORDER — OXYCODONE HCL 5 MG/5ML PO SOLN: 5.0000 mg | Freq: Once | ORAL | Status: DC | PRN

## 2011-11-22 MED ORDER — FENTANYL CITRATE 0.05 MG/ML IJ SOLN
INTRAMUSCULAR | Status: AC
  Filled 2011-11-22: qty 2

## 2011-11-22 MED ORDER — LIDOCAINE HCL (CARDIAC) 20 MG/ML IV SOLN
INTRAVENOUS | Status: DC | PRN
  Administered 2011-11-22: 60 mg via INTRAVENOUS

## 2011-11-22 MED ORDER — MIDAZOLAM HCL 2 MG/ML PO SYRP
15.0000 mg | ORAL_SOLUTION | Freq: Once | ORAL | Status: AC
  Administered 2011-11-22: 15 mg via ORAL
  Filled 2011-11-22: qty 8

## 2011-11-22 MED ORDER — LACTATED RINGERS IV SOLN
INTRAVENOUS | Status: DC | PRN
  Administered 2011-11-22: 09:00:00 via INTRAVENOUS

## 2011-11-22 MED ORDER — OXYCODONE HCL 5 MG PO TABS: 5.0000 mg | ORAL_TABLET | Freq: Once | ORAL | Status: DC | PRN

## 2011-11-22 MED ORDER — MIDAZOLAM HCL 2 MG/2ML IJ SOLN: 15.0000 mg | INTRAMUSCULAR | Status: DC | PRN

## 2011-11-22 MED ORDER — ARTIFICIAL TEARS OP OINT
TOPICAL_OINTMENT | OPHTHALMIC | Status: DC | PRN
  Administered 2011-11-22: 1 via OPHTHALMIC

## 2011-11-22 MED ORDER — FENTANYL CITRATE 0.05 MG/ML IJ SOLN
25.0000 ug | INTRAMUSCULAR | Status: DC | PRN
  Administered 2011-11-22: 25 ug via INTRAVENOUS

## 2011-11-22 MED ORDER — ONDANSETRON HCL 4 MG/2ML IJ SOLN
INTRAMUSCULAR | Status: DC | PRN
  Administered 2011-11-22: 4 mg via INTRAVENOUS

## 2011-11-22 MED ORDER — PROMETHAZINE HCL 25 MG/ML IJ SOLN: 6.2500 mg | INTRAMUSCULAR | Status: DC | PRN

## 2011-11-22 SURGICAL SUPPLY — 25 items
BANDAGE COBAN STERILE 2 (GAUZE/BANDAGES/DRESSINGS) IMPLANT
BANDAGE CONFORM 2  STR LF (GAUZE/BANDAGES/DRESSINGS) ×3 IMPLANT
BLADE SURG 15 STRL LF DISP TIS (BLADE) IMPLANT
BLADE SURG 15 STRL SS (BLADE)
CANISTER SUCTION 1200CC (MISCELLANEOUS) ×2 IMPLANT
CATH ROBINSON RED A/P 10FR (CATHETERS) ×1 IMPLANT
CLOTH BEACON ORANGE TIMEOUT ST (SAFETY) ×2 IMPLANT
CONT SPEC 4OZ CLIKSEAL STRL BL (MISCELLANEOUS) ×2 IMPLANT
COVER MAYO STAND STRL (DRAPES) ×2 IMPLANT
COVER SURGICAL LIGHT HANDLE (MISCELLANEOUS) ×2 IMPLANT
DRAPE PROXIMA HALF (DRAPES) ×2 IMPLANT
GLOVE BIO SURGEON STRL SZ 6 (GLOVE) IMPLANT
GLOVE BIO SURGEON STRL SZ 6.5 (GLOVE) IMPLANT
GLOVE BIO SURGEON STRL SZ7 (GLOVE) IMPLANT
GLOVE ECLIPSE 6.5 STRL STRAW (GLOVE) ×1 IMPLANT
GLOVE ECLIPSE 7.5 STRL STRAW (GLOVE) ×1 IMPLANT
GLOVE SURG SS PI 7.0 STRL IVOR (GLOVE) ×1 IMPLANT
GLOVE SURG SS PI 7.5 STRL IVOR (GLOVE) ×2 IMPLANT
NEEDLE 27GAX1X1/2 (NEEDLE) IMPLANT
PAD EYE OVAL STERILE LF (GAUZE/BANDAGES/DRESSINGS) IMPLANT
SYR 50ML SLIP (SYRINGE) IMPLANT
TOWEL OR 17X24 6PK STRL BLUE (TOWEL DISPOSABLE) ×2 IMPLANT
TUBE CONNECTING 20X1/4 (TUBING) ×2 IMPLANT
WATER STERILE IRR 1000ML POUR (IV SOLUTION) ×2 IMPLANT
YANKAUER SUCT BULB TIP NO VENT (SUCTIONS) ×2 IMPLANT

## 2011-11-22 NOTE — Transfer of Care (Signed)
Immediate Anesthesia Transfer of Care Note  Patient: [name]  Procedure(s) Performed: Procedure(s) (LRB) with comments: DENTAL RESTORATION/EXTRACTIONS (N/A) - dental restoration and cleaning; NO EXTRACTIONS  Patient Location: PACU  Anesthesia Type: General  Level of Consciousness: awake and sedated  Airway & Oxygen Therapy: Patient Spontanous Breathing and Patient connected to face mask oxygen  Post-op Assessment: Report given to PACU RN and Post -op Vital signs reviewed and stable  Post vital signs: Reviewed and stable  Complications: No apparent anesthesia complications

## 2011-11-22 NOTE — OR Nursing (Signed)
Updated significant other on patient's status in OR per surgeon's request.

## 2011-11-22 NOTE — Preoperative (Signed)
Beta Blockers   Reason not to administer Beta Blockers:Not Applicable 

## 2011-11-22 NOTE — Anesthesia Procedure Notes (Signed)
Procedure Name: Intubation Date/Time: 11/22/2011 9:04 AM Performed by: Lovie Chol Pre-anesthesia Checklist: Patient identified, Emergency Drugs available, Suction available, Patient being monitored and Timeout performed Patient Re-evaluated:Patient Re-evaluated prior to inductionOxygen Delivery Method: Circle system utilized Preoxygenation: Pre-oxygenation with 100% oxygen Intubation Type: Inhalational induction Ventilation: Mask ventilation without difficulty and Oral airway inserted - appropriate to patient size Grade View: Grade I Nasal Tubes: Right and Nasal Rae Tube size: 7.0 mm Number of attempts: 1 Airway Equipment and Method: Video-laryngoscopy Placement Confirmation: positive ETCO2 and breath sounds checked- equal and bilateral Secured at: 28 cm Tube secured with: Tape Dental Injury: Teeth and Oropharynx as per pre-operative assessment  Difficulty Due To: Difficulty was anticipated, Difficult Airway-  due to neck instability, Difficult Airway- due to reduced neck mobility, Difficult Airway- due to anterior larynx, Difficult Airway- due to limited oral opening and Difficult Airway- due to dentition

## 2011-11-22 NOTE — Anesthesia Preprocedure Evaluation (Addendum)
Anesthesia Evaluation  Patient identified by MRN, date of birth, ID band Patient awake    Reviewed: Allergy & Precautions, H&P , NPO status , Patient's Chart, lab work & pertinent test results, reviewed documented beta blocker date and time   History of Anesthesia Complications Negative for: history of anesthetic complications  Airway Mallampati: II TM Distance: >3 FB Neck ROM: Limited    Dental  (+) Teeth Intact and Dental Advisory Given   Pulmonary  breath sounds clear to auscultation        Cardiovascular hypertension, Pt. on medications and Pt. on home beta blockers DVT (On coumadin) + dysrhythmias Atrial Fibrillation Rhythm:Regular Rate:Tachycardia     Neuro/Psych Cerebral palsy    GI/Hepatic   Endo/Other    Renal/GU      Musculoskeletal   Abdominal   Peds  Hematology   Anesthesia Other Findings   Reproductive/Obstetrics                         Anesthesia Physical Anesthesia Plan  ASA: III  Anesthesia Plan: General   Post-op Pain Management:    Induction: Intravenous  Airway Management Planned: Nasal ETT and Video Laryngoscope Planned  Additional Equipment:   Intra-op Plan:   Post-operative Plan: Extubation in OR  Informed Consent: I have reviewed the patients History and Physical, chart, labs and discussed the procedure including the risks, benefits and alternatives for the proposed anesthesia with the patient or authorized representative who has indicated his/her understanding and acceptance.     Plan Discussed with: CRNA and Surgeon  Anesthesia Plan Comments:         Anesthesia Quick Evaluation

## 2011-11-22 NOTE — Progress Notes (Signed)
Called [facility] to give report.  No RN available.  Update given to caregiver on duty.  No prescriptions available to send with patient to group home.  AVS printed and faxed.  EJ line pulled and good hemostasis achieved.  Sent with driver back to group home in wheel chair.

## 2011-11-22 NOTE — Anesthesia Postprocedure Evaluation (Signed)
  Anesthesia Post-op Note  Patient: [name]  Procedure(s) Performed: Procedure(s) (LRB) with comments: DENTAL RESTORATION/EXTRACTIONS (N/A) - dental restoration and cleaning; NO EXTRACTIONS  Patient Location: PACU  Anesthesia Type: General  Level of Consciousness: awake  Airway and Oxygen Therapy: Patient Spontanous Breathing  Post-op Pain: mild  Post-op Assessment: Post-op Vital signs reviewed, Patient's Cardiovascular Status Stable, Respiratory Function Stable, Patent Airway, No signs of Nausea or vomiting and Pain level controlled  Post-op Vital Signs: stable  Complications: No apparent anesthesia complications

## 2011-11-22 NOTE — Op Note (Signed)
Patient was brought back to the OR in good and stable condition with appropriate lab values.  Intubated nasally after IV was placed. Patient received appropriate DVT compressions for legs to minize risk of DVT. Draped in usual manner. Throat pack placed and lead shielding used for x-rays and they were diagnosed and evaluted to be wnl normal limits aside from dental caries. Teeth were cleaned and scaled and all teeth were restored under rubber dam isolation #2 mo decay ssc size 5 #7 and #10 L decay and L comp #13 df decay and df composite over vb prognosis poor #18 ob decay ssc size #6 with vb prognosis guarded recommend ext if sx #20, 21 o decay, o comp #22 mf decay Mfl comp #25 mfd decay  mfdl comp #28 and 29 o decay o cmop prophy and fl and preop count matched postop count and throat pack was removed and patient was recovering well...poi given to girlfriend and priest/friend of patient  2 week follow upindicated  Return to normal medication will be coordinate by his NP Raynelle Fanning and home health will round on patient tomorrow T.[email or web address]

## 2011-11-22 NOTE — OR Nursing (Signed)
Updated significant other on patient's status in OR per surgeon's request.  

## 2011-11-25 ENCOUNTER — Encounter (HOSPITAL_COMMUNITY): Payer: Self-pay | Admitting: Dentistry

## 2012-08-16 IMAGING — CR DG FOOT COMPLETE 3+V*R*
4 series · 4 of 4 positions shown · non-contrast
Comparison: None.

CLINICAL DATA: Pain.  Swelling.

RIGHT FOOT COMPLETE - 3+ VIEW

[x foot ap right]
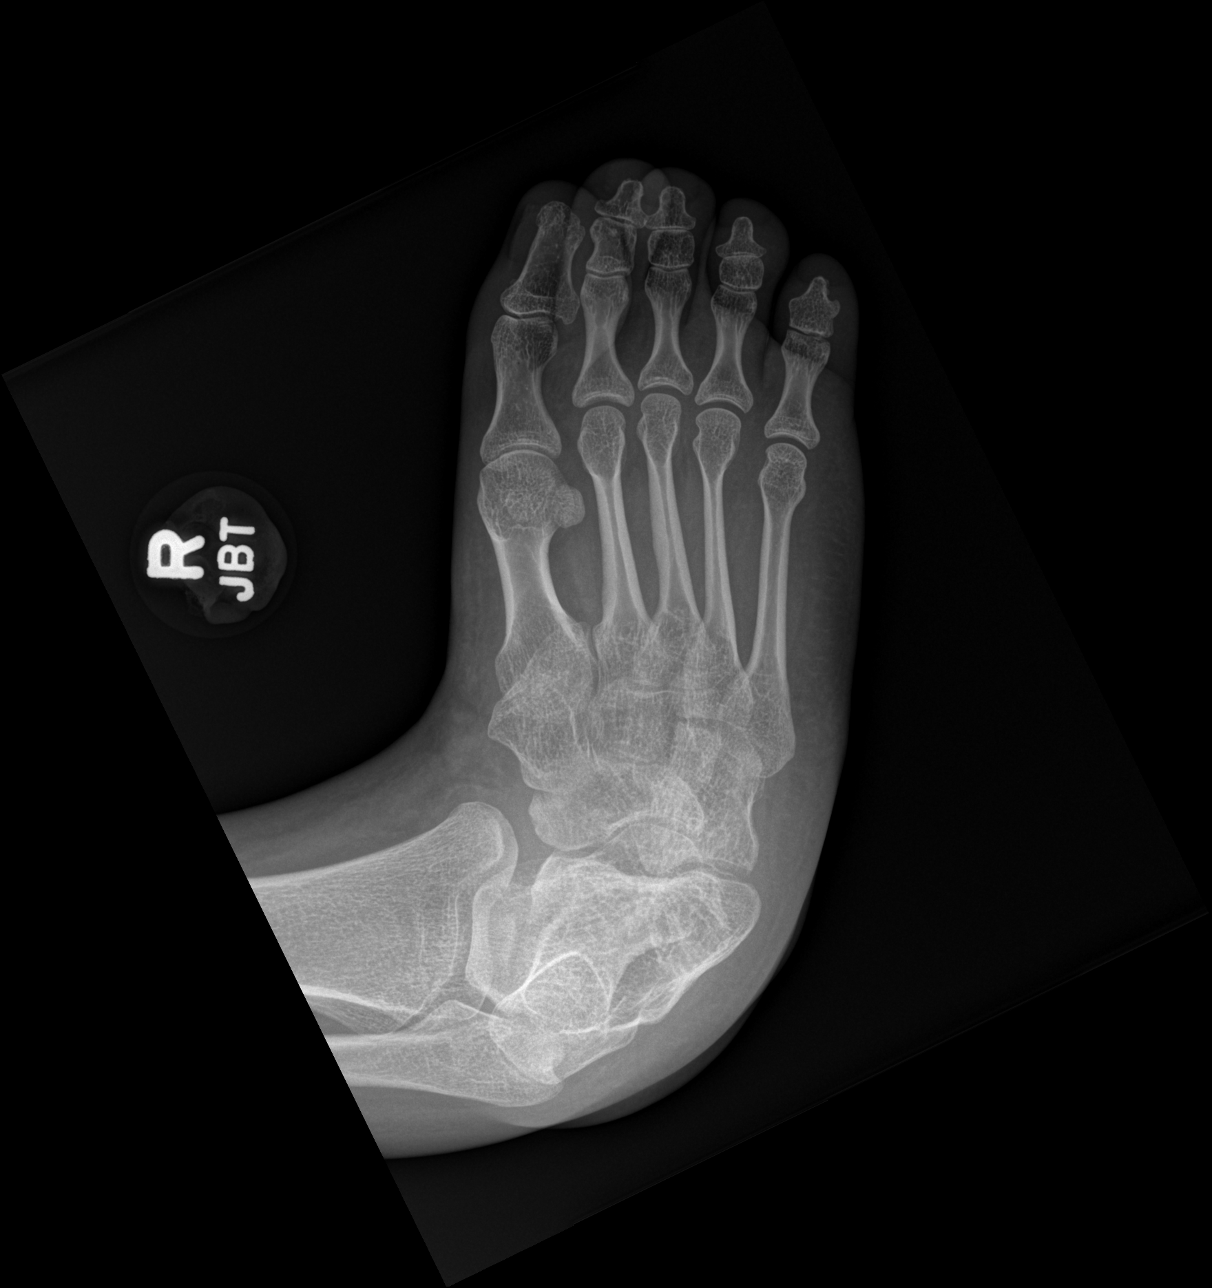

[x foot obl right (1 of 2)]
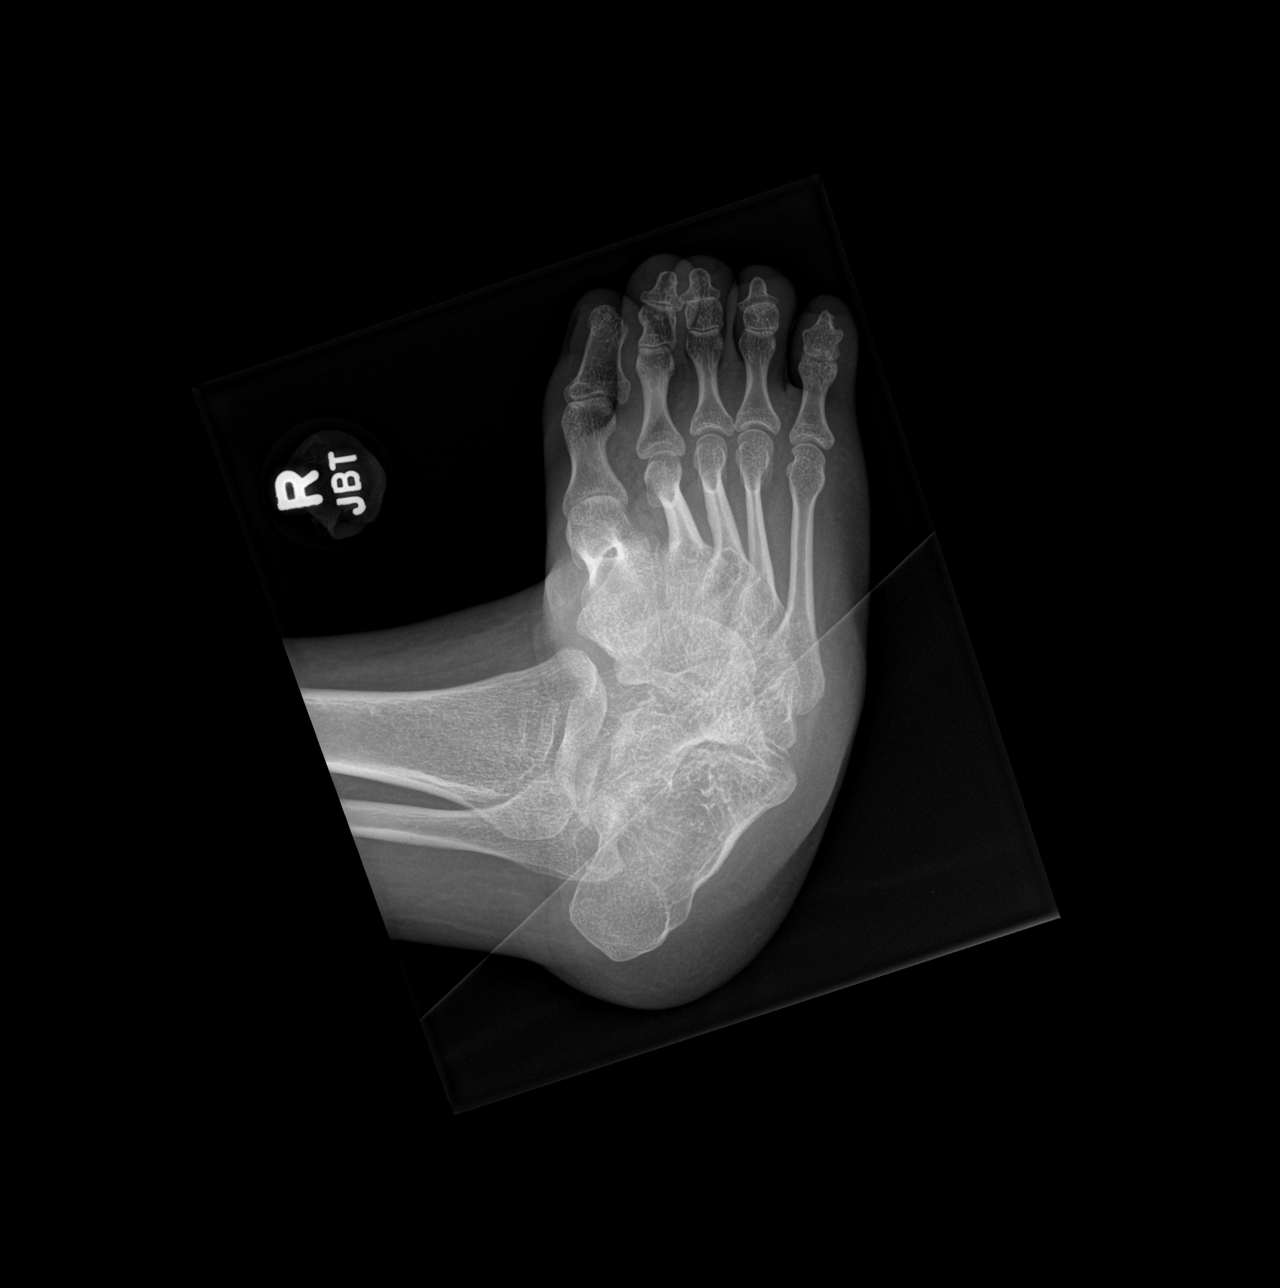

[x foot obl right (2 of 2)]
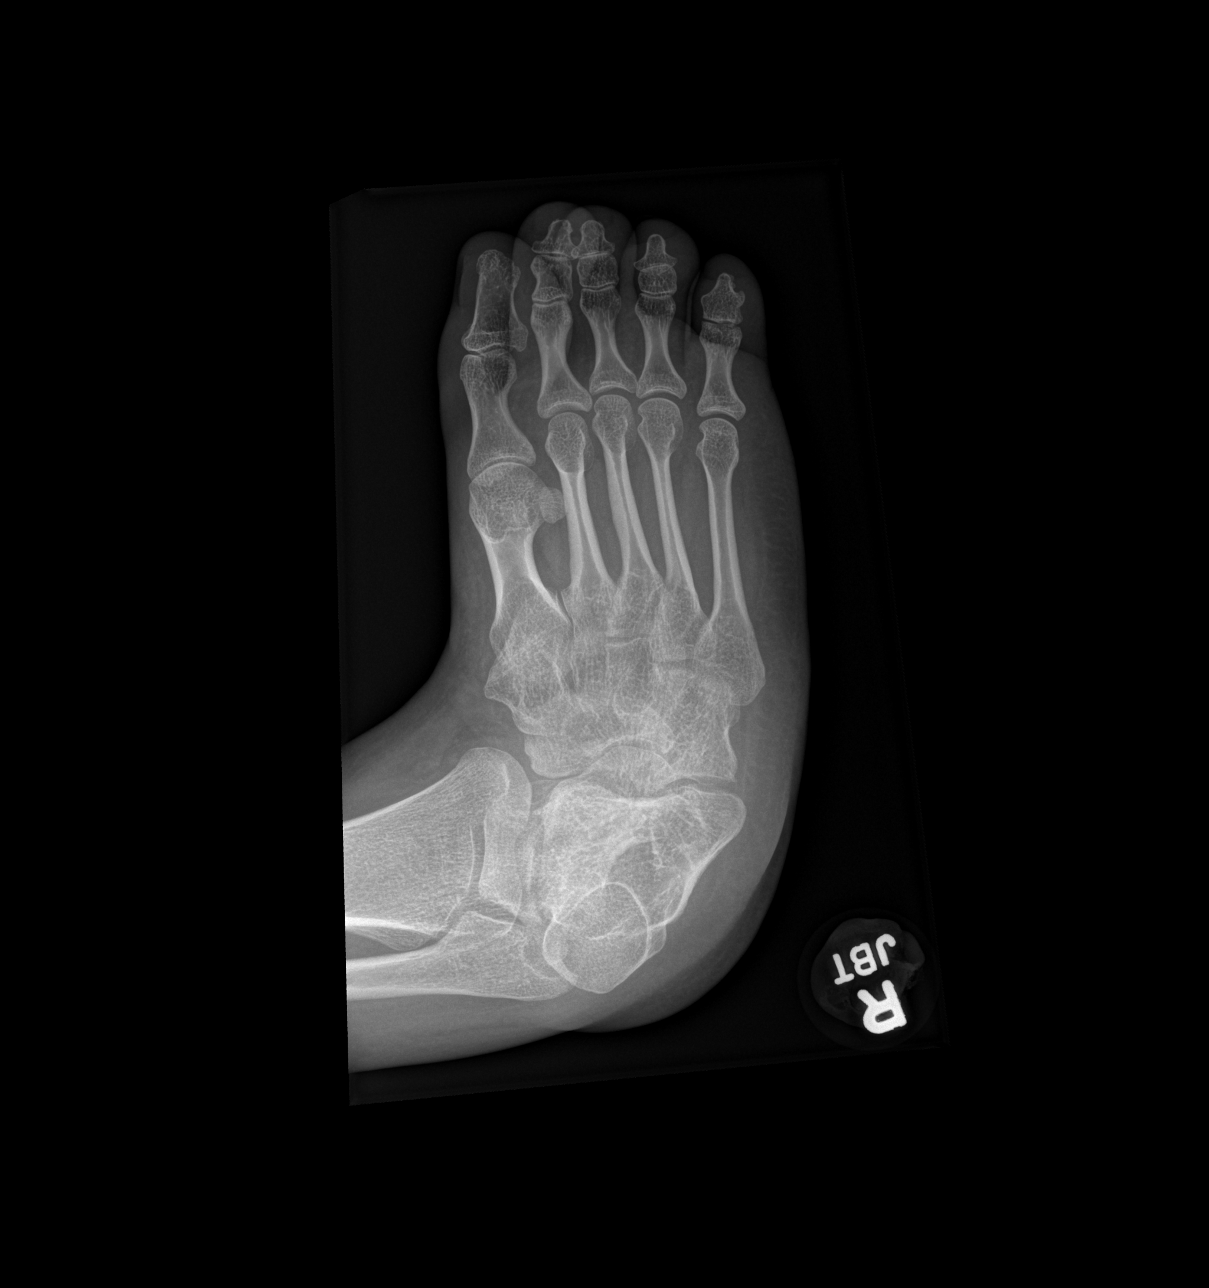

[x foot lat right]
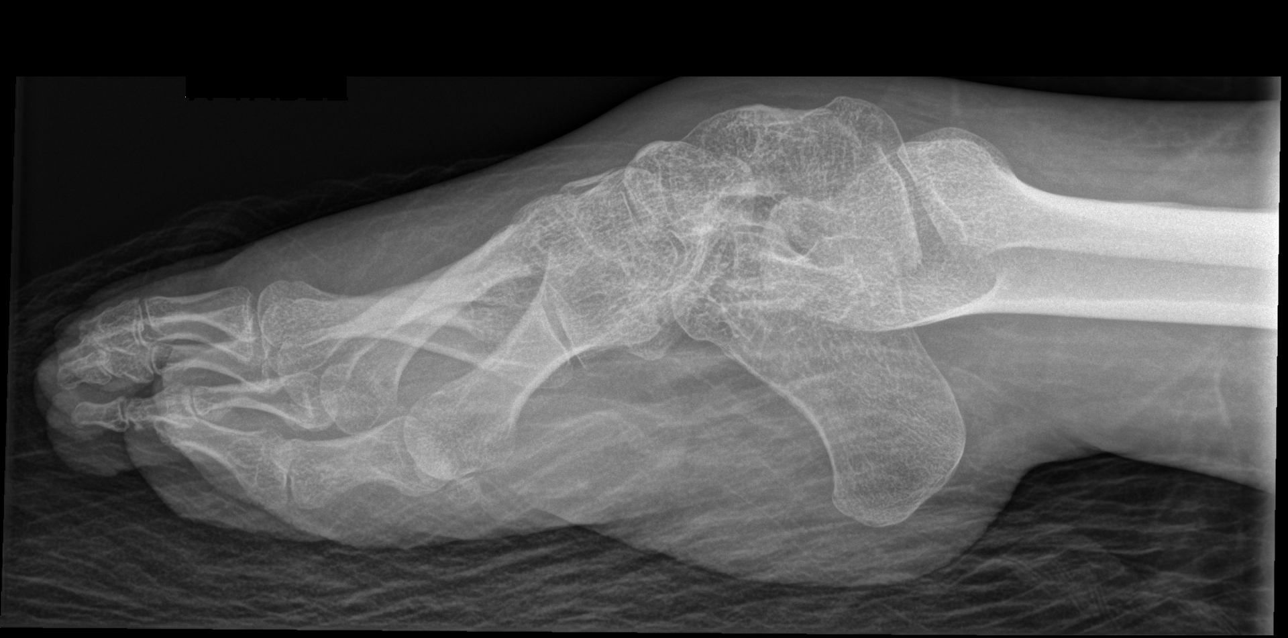

[4 of 4 positions shown; findings below may reference images not displayed]

FINDINGS: There is marked chronic deformity of the right ankle and
foot.  I see no acute fracture.  Soft tissues are intact.  Normal
bone mineralization.
IMPRESSION: Marked chronic, possibly congenital deformity of the right foot.
No acute findings.

## 2013-08-02 ENCOUNTER — Ambulatory Visit: Payer: Medicare Other | Attending: Adult Health | Admitting: Physical Therapy

## 2013-08-02 DIAGNOSIS — M6281 Muscle weakness (generalized): Secondary | ICD-10-CM | POA: Insufficient documentation

## 2013-08-02 DIAGNOSIS — IMO0001 Reserved for inherently not codable concepts without codable children: Secondary | ICD-10-CM | POA: Insufficient documentation

## 2013-08-02 DIAGNOSIS — Z993 Dependence on wheelchair: Secondary | ICD-10-CM | POA: Diagnosis not present

## 2013-08-02 DIAGNOSIS — G809 Cerebral palsy, unspecified: Secondary | ICD-10-CM | POA: Diagnosis not present

## 2013-08-02 DIAGNOSIS — R000 Tachycardia, unspecified: Secondary | ICD-10-CM | POA: Diagnosis not present

## 2013-12-02 ENCOUNTER — Encounter: Payer: Self-pay | Admitting: Neurology

## 2013-12-02 ENCOUNTER — Ambulatory Visit (INDEPENDENT_AMBULATORY_CARE_PROVIDER_SITE_OTHER): Payer: Medicare Other | Admitting: Neurology

## 2013-12-02 DIAGNOSIS — G809 Cerebral palsy, unspecified: Secondary | ICD-10-CM

## 2013-12-02 DIAGNOSIS — R252 Cramp and spasm: Secondary | ICD-10-CM | POA: Insufficient documentation

## 2013-12-02 DIAGNOSIS — R258 Other abnormal involuntary movements: Secondary | ICD-10-CM

## 2013-12-02 MED ORDER — TIZANIDINE HCL 2 MG PO CAPS: 2.0000 mg | ORAL_CAPSULE | Freq: Three times a day (TID) | ORAL | Status: DC

## 2013-12-02 NOTE — Progress Notes (Signed)
PATIENT: [name] DOB: 07-07-1980  HISTORICAL  [name] is a 33 years old right-handed male, accompanied by his caregiver Daleen Snook for evaluation of spasticity.  He was born with cerebral palsy, has been wheelchair-bound, now lives alone, with his caregiver lives in with him, his father, stepmother, grandmother, is actively involved in his care.  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  REVIEW OF SYSTEMS: Full 14 system review of systems performed and notable only for  above  ALLERGIES: Allergies  Allergen Reactions  . Betadine [Povidone Iodine]   . Ciprofloxacin   . Morphine     HOME MEDICATIONS: Current Outpatient Prescriptions on File Prior to Visit  Medication Sig Dispense Refill  . acetaminophen (TYLENOL) 325 MG tablet Take 650 mg by mouth every 6 (six) hours as needed. For pain    . baclofen (LIORESAL) 20 MG tablet Take 20 mg by mouth 4 (four) times daily.     . calcium carbonate (TUMS - DOSED IN MG ELEMENTAL CALCIUM) 500 MG chewable tablet Chew 1 tablet by mouth 3 (three) times daily.    . cholecalciferol (VITAMIN D) 1000 UNITS tablet Take 1,000 Units by mouth 2 (two) times  daily.    . cyclobenzaprine (FLEXERIL) 10 MG tablet Take 10 mg by mouth 3 (three) times daily.     . diazepam (VALIUM) 2 MG tablet Take 2 mg by mouth 2 (two) times daily.    Marland Kitchen glycerin adult (GLYCERIN ADULT) 2 G SUPP Place 1 suppository rectally every other day.    . hydrochlorothiazide (HYDRODIURIL) 25 MG tablet Take 25 mg by mouth daily.    Marland Kitchen loperamide (IMODIUM) 2 MG capsule Take 2 mg by mouth 3 (three) times daily as needed. For diarrhea    . metoprolol (TOPROL-XL) 100 MG 24 hr tablet Take 100 mg by mouth daily.     . Multiple Vitamin (MULITIVITAMIN WITH MINERALS) TABS Take 1 tablet by mouth daily.    . nitrofurantoin (MACRODANTIN) 50 MG capsule Take 50 mg by mouth daily.    . sodium phosphate (FLEET) enema Place 1 enema rectally See admin instructions. As directed rectally once daily if no BM in 3 days    . warfarin (COUMADIN) 2 MG tablet Take 2 mg by mouth daily. Takes on Monday, Wednesday, and Friday    . warfarin (COUMADIN) 4 MG tablet Take 4 mg by mouth. Takes on Tuesday, Thursday, Saturday, and Sunday     No current facility-administered medications on file prior to visit.    PAST MEDICAL HISTORY: Past Medical History  Diagnosis Date  . Cerebral palsy   . Recurrent UTI   . Coagulopathy   . Insomnia   . Tachycardia   . Chronic constipation   .  Hypertension     PAST SURGICAL HISTORY: Past Surgical History  Procedure Laterality Date  . Small intestine surgery    . Incise and drain abcess  09/11/10  . Elbow surgery  09/12/10    left  . Tooth extraction  11/22/2011    Procedure: DENTAL RESTORATION/EXTRACTIONS;  Surgeon: Marcelo Baldy, MD;  Location: [facility];  Service: Dentistry;  Laterality: N/A;  dental restoration and cleaning; NO EXTRACTIONS    FAMILY HISTORY: History reviewed. No pertinent family history.  SOCIAL HISTORY:  History   Social History  . Marital Status: Single    Spouse Name: N/A    Number of Children: N/A  . Years of Education: N/A   Occupational  History  . Not on file.   Social History Main Topics  . Smoking status: Never Smoker   . Smokeless tobacco: Never Used  . Alcohol Use: No  . Drug Use: No  . Sexual Activity: No   Other Topics Concern  . Not on file   Social History Narrative     PHYSICAL EXAM   Filed Vitals:    Not recorded      Cannot calculate BMI with a height equal to zero.   Generalized: In no acute distress  Neck: Supple, no carotid bruits   Cardiac: Regular rate rhythm  Pulmonary: Clear to auscultation bilaterally  Musculoskeletal: No deformity  Neurological examination  Mentation: he sits in wheelchair, slow spastic dysarthria,  Cranial nerve II-XII: Pupils were equal round reactive to light. Extraocular movements were full.  Visual field were full on confrontational test.Facial sensation and strength were normal. Hearing was intact to finger rubbing bilaterally. Uvula tongue midline.  He has slow spastic tongue movement,  Motor: Profound spasticity of bilateral upper and lower extremities,right arm has relatively free movement,tends to stay in right shoulder extension, elbow extension, wrist finger flexion, more profound left upper extremity spasticity, with left shoulder extension, elbow flexion, wrist extension, finger flexion, Trace movement of left foot,sixth bilateral knee contraction. Limbs were hold in place by straps.  Sensory: Intact to fine touch, pinprick,   Gait: deferred  Deep tendon reflexes:  Hypoactive and symmetric  DIAGNOSTIC DATA (LABS, IMAGING, TESTING) - I reviewed patient records, labs, notes, testing and imaging myself where available.  Lab Results  Component Value Date   WBC 6.9 11/14/2011   HGB 16.7 11/14/2011   HCT 48.8 11/14/2011   MCV 88.7 11/14/2011   PLT 244 11/14/2011      Component Value Date/Time   NA 143 11/14/2011 1127   K 4.8 11/14/2011 1127   CL 103 11/14/2011 1127   CO2 29 11/14/2011 1127   GLUCOSE 83 11/14/2011 1127   BUN 11  11/14/2011 1127   CREATININE 0.97 11/14/2011 1127   CALCIUM 10.4 11/14/2011 1127   PROT 7.8 06/03/2011 1015   ALBUMIN 4.4 06/03/2011 1015   AST 21 06/03/2011 1015   ALT 17 06/03/2011 1015   ALKPHOS 72 06/03/2011 1015   BILITOT 1.3* 06/03/2011 1015   GFRNONAA >90 11/14/2011 1127   GFRAA >90 11/14/2011 1127   Lab Results  Component Value Date   CHOL  09/30/2008    87        ATP III CLASSIFICATION:  <200     mg/dL   Desirable  200-239  mg/dL   Borderline High  >=240    mg/dL   High          TRIG 50 09/30/2008   No results found for: HGBA1C No results found  for: [id number] Lab Results  Component Value Date   TSH 0.225* 06/03/2011      ASSESSMENT AND PLAN  Kristofor Michalowski is a 33 y.o. male with cerebral palsy, spasticity of upper and lower extremities, and trunk muscles,  1. continue current dose of baclofen 20 mg 4 times a day, Valium 2mg  twice a day. 2. Add on tizanidine 2 mg 3 times a day, 3, I also discussed with him about EMG guided Botox injection for his spasticity, especially left upper extremity, 4.  return to clinic in 3 months   Marcial Pacas, M.D. Ph.D.  [facility] [address], [place], [place] [address] [phone]

## 2014-03-07 ENCOUNTER — Ambulatory Visit: Payer: Medicare Other | Admitting: Neurology

## 2014-04-07 ENCOUNTER — Ambulatory Visit: Payer: Medicare Other | Admitting: Neurology

## 2014-04-07 ENCOUNTER — Telehealth: Payer: Self-pay | Admitting: *Deleted

## 2014-04-07 NOTE — Telephone Encounter (Signed)
No showed appt 

## 2014-04-08 ENCOUNTER — Encounter: Payer: Self-pay | Admitting: Neurology

## 2014-05-19 ENCOUNTER — Encounter: Payer: Self-pay | Admitting: Neurology

## 2014-05-19 ENCOUNTER — Ambulatory Visit (INDEPENDENT_AMBULATORY_CARE_PROVIDER_SITE_OTHER): Payer: Medicare Other | Admitting: Neurology

## 2014-05-19 VITALS — BP 126/72 | HR 64

## 2014-05-19 DIAGNOSIS — R258 Other abnormal involuntary movements: Secondary | ICD-10-CM | POA: Diagnosis not present

## 2014-05-19 DIAGNOSIS — G809 Cerebral palsy, unspecified: Secondary | ICD-10-CM | POA: Diagnosis not present

## 2014-05-19 DIAGNOSIS — R252 Cramp and spasm: Secondary | ICD-10-CM

## 2014-05-19 MED ORDER — BACLOFEN 20 MG PO TABS: 20.0000 mg | ORAL_TABLET | Freq: Four times a day (QID) | ORAL | Status: DC

## 2014-05-19 MED ORDER — TIZANIDINE HCL 2 MG PO CAPS: 2.0000 mg | ORAL_CAPSULE | Freq: Three times a day (TID) | ORAL | Status: DC

## 2014-05-19 MED ORDER — DIAZEPAM 2 MG PO TABS: 2.0000 mg | ORAL_TABLET | Freq: Two times a day (BID) | ORAL | Status: DC

## 2014-05-19 MED ORDER — CYCLOBENZAPRINE HCL 10 MG PO TABS: 10.0000 mg | ORAL_TABLET | Freq: Three times a day (TID) | ORAL | Status: DC

## 2014-05-19 NOTE — Progress Notes (Signed)
PATIENT: [name] DOB: December 12, 1980  HISTORICAL  Denys Salinger is a 34 years old right-handed male, accompanied by his caregiver Daleen Snook for evaluation of spasticity.  He was born with cerebral palsy, has been wheelchair-bound, now lives alone, with his caregiver lives in with him, his father, stepmother, grandmother, is actively involved in his care.  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  He suffered bilateral lower extremity DVT in 2000, and 2005,on chronic Coumadin treatment,  Over the years, his spasticity has been controlled by medications, baclofen 20 mg 4 times a day, Valium 2 mg twice a day, Flexeril 10 mg 3 times a day, but a spasticity has gradually worsened over time, sometimes it was so forceful, he ripped the arm rest  UPDATE May 19 2014: He is with his caregiver daily, he still works full time, he likes current medication changes, baclofen 20 mg 4 times a day, Valium 2 mg twice a day, Flexeril 10 mg 3 times a day, I have added on tizanidine 2 mg 3 times a day, works well,  REVIEW OF SYSTEMS: Full 14 system review of systems performed and notable only for  above  ALLERGIES: Allergies  Allergen Reactions  . Betadine [Povidone Iodine]   . Ciprofloxacin   . Morphine     HOME MEDICATIONS: Current Outpatient Prescriptions on File Prior to Visit  Medication Sig Dispense Refill  . acetaminophen (TYLENOL) 325 MG tablet Take 650 mg by mouth every 6 (six) hours as needed. For pain    . baclofen (LIORESAL) 20 MG tablet Take 20 mg by  mouth 4 (four) times daily.     . calcium carbonate (TUMS - DOSED IN MG ELEMENTAL CALCIUM) 500 MG chewable tablet Chew 1 tablet by mouth 3 (three) times daily.    . cholecalciferol (VITAMIN D) 1000 UNITS tablet Take 1,000 Units by mouth 2 (two) times daily.    . cyclobenzaprine (FLEXERIL) 10 MG tablet Take 10 mg by mouth 3 (three) times daily.     . diazepam (VALIUM) 2 MG tablet Take 2 mg by mouth 2 (two) times daily.    Marland Kitchen glycerin adult (GLYCERIN ADULT) 2 G SUPP Place 1 suppository rectally every other day.    . hydrochlorothiazide (HYDRODIURIL) 25 MG tablet Take 25 mg by mouth daily.    Marland Kitchen loperamide (IMODIUM) 2 MG capsule Take 2 mg by mouth 3 (three) times daily as needed. For diarrhea    . metoprolol (TOPROL-XL) 100 MG 24 hr tablet Take 100 mg by mouth daily.     . Multiple Vitamin (MULITIVITAMIN WITH MINERALS) TABS Take 1 tablet by mouth daily.    . nitrofurantoin (MACRODANTIN) 50 MG capsule Take 50 mg by mouth daily.    . sodium phosphate (FLEET) enema Place 1 enema rectally See admin instructions. As directed rectally once daily if no BM in 3 days    . tizanidine (ZANAFLEX) 2 MG capsule Take 1 capsule (2 mg total) by mouth 3 (three) times daily. 90 capsule 11  . warfarin (COUMADIN) 2 MG tablet Take 2 mg by mouth daily. Takes on Monday, Wednesday,  and Friday    . warfarin (COUMADIN) 4 MG tablet Take 4 mg by mouth. Takes on Tuesday, Thursday, Saturday, and Sunday     No current facility-administered medications on file prior to visit.    PAST MEDICAL HISTORY: Past Medical History  Diagnosis Date  . Cerebral palsy   . Recurrent UTI   . Coagulopathy   . Insomnia   . Tachycardia   . Chronic constipation   . Hypertension     PAST SURGICAL HISTORY: Past Surgical History  Procedure Laterality Date  . Small intestine surgery    . Incise and drain abcess  09/11/10  . Elbow surgery  09/12/10    left  . Tooth extraction  11/22/2011    Procedure: DENTAL RESTORATION/EXTRACTIONS;   Surgeon: Marcelo Baldy, MD;  Location: [place];  Service: Dentistry;  Laterality: N/A;  dental restoration and cleaning; NO EXTRACTIONS    FAMILY HISTORY: No family history on file.  SOCIAL HISTORY:  History   Social History  . Marital Status: Single    Spouse Name: N/A  . Number of Children: N/A  . Years of Education: N/A   Occupational History  . Not on file.   Social History Main Topics  . Smoking status: Never Smoker   . Smokeless tobacco: Never Used  . Alcohol Use: No  . Drug Use: No  . Sexual Activity: No   Other Topics Concern  . Not on file   Social History Narrative     PHYSICAL EXAM   There were no vitals filed for this visit.  Not recorded      There is no weight on file to calculate BMI.   Generalized: In no acute distress  Neck: Supple, no carotid bruits   Cardiac: Regular rate rhythm  Pulmonary: Clear to auscultation bilaterally  Musculoskeletal: No deformity  Neurological examination  Mentation: he sits in wheelchair, slow spastic dysarthria,  Cranial nerve II-XII: Pupils were equal round reactive to light. Extraocular movements were full.  Visual field were full on confrontational test.Facial sensation and strength were normal. Hearing was intact to finger rubbing bilaterally. Uvula tongue midline.  He has slow spastic tongue movement,  Motor: Profound spasticity of bilateral upper and lower extremities,right arm has relatively free movement,tends to stay in right shoulder extension, elbow extension, wrist finger flexion, more profound left upper extremity spasticity, with left shoulder extension, elbow flexion, wrist extension, finger flexion, Trace movement of left foot,sixth bilateral knee contraction. Limbs were hold in place by straps.  Sensory: Intact to fine touch, pinprick,   Gait: deferred  Deep tendon reflexes:  Hypoactive and symmetric  DIAGNOSTIC DATA (LABS, IMAGING, TESTING) - I reviewed patient records, labs, notes,  testing and imaging myself where available.  Lab Results  Component Value Date   WBC 6.9 11/14/2011   HGB 16.7 11/14/2011   HCT 48.8 11/14/2011   MCV 88.7 11/14/2011   PLT 244 11/14/2011      Component Value Date/Time   NA 143 11/14/2011 1127   K 4.8 11/14/2011 1127   CL 103 11/14/2011 1127   CO2 29 11/14/2011 1127   GLUCOSE 83 11/14/2011 1127   BUN 11 11/14/2011 1127   CREATININE 0.97 11/14/2011 1127   CALCIUM 10.4 11/14/2011 1127   PROT 7.8 06/03/2011 1015   ALBUMIN 4.4 06/03/2011 1015   AST 21 06/03/2011 1015   ALT 17 06/03/2011 1015   ALKPHOS 72 06/03/2011 1015   BILITOT 1.3* 06/03/2011 1015   GFRNONAA >90 11/14/2011 1127   GFRAA >  90 11/14/2011 1127   Lab Results  Component Value Date   CHOL  09/30/2008    87        ATP III CLASSIFICATION:  <200     mg/dL   Desirable  200-239  mg/dL   Borderline High  >=240    mg/dL   High          TRIG 50 09/30/2008   No results found for: HGBA1C No results found for: VITAMINB12 Lab Results  Component Value Date   TSH 0.225* 06/03/2011      ASSESSMENT AND PLAN  Yancarlos Berthold is a 34 y.o. male with cerebral palsy, spasticity of upper and lower extremities, and trunk muscles,  1. continue current dose of baclofen 20 mg 4 times a day, Valium 2mg  twice a day. 2. Add on tizanidine 2 mg 3 times a day, 3, I have refilled his medications,return to clinic in 6 months with Rhae Hammock, M.D. Ph.D.  [facility] [address], [place], [place] [address] [phone]

## 2014-11-22 ENCOUNTER — Ambulatory Visit: Payer: Medicare Other | Admitting: Nurse Practitioner

## 2014-11-23 ENCOUNTER — Encounter: Payer: Self-pay | Admitting: Nurse Practitioner

## 2014-12-06 ENCOUNTER — Ambulatory Visit (INDEPENDENT_AMBULATORY_CARE_PROVIDER_SITE_OTHER): Payer: Medicare Other | Admitting: Nurse Practitioner

## 2014-12-06 ENCOUNTER — Encounter: Payer: Self-pay | Admitting: Nurse Practitioner

## 2014-12-06 VITALS — BP 113/73 | HR 100 | Ht 70.0 in | Wt 163.4 lb

## 2014-12-06 DIAGNOSIS — G809 Cerebral palsy, unspecified: Secondary | ICD-10-CM

## 2014-12-06 DIAGNOSIS — R258 Other abnormal involuntary movements: Secondary | ICD-10-CM | POA: Diagnosis not present

## 2014-12-06 DIAGNOSIS — R252 Cramp and spasm: Secondary | ICD-10-CM

## 2014-12-06 MED ORDER — DIAZEPAM 2 MG PO TABS: 2.0000 mg | ORAL_TABLET | Freq: Two times a day (BID) | ORAL | Status: DC

## 2014-12-06 MED ORDER — CYCLOBENZAPRINE HCL 10 MG PO TABS: ORAL_TABLET | ORAL | Status: DC

## 2014-12-06 NOTE — Progress Notes (Signed)
[facility]  PATIENT: [name] DOB: 1981-01-04   REASON FOR VISIT: Follow-up for cerebral palsy, spasticity HISTORY FROM: Caregiver and patient  HISTORY OF PRESENT ILLNESS:[name] is a 34 years old right-handed male, accompanied by his caregiver [name] for evaluation of spasticity. He was born with cerebral palsy, has been wheelchair-bound, now lives alone, with his caregiver lives in with him, his father, stepmother, grandmother, is actively involved in his care. 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 He suffered bilateral lower extremity DVT in 2000, and 2005,on chronic Coumadin treatment, Over the years, his spasticity has been controlled by medications, baclofen 20 mg 4 times a day, Valium 2 mg twice a day, Flexeril 10 mg 3 times a day, but a spasticity has gradually worsened over time, sometimes it was so forceful, he ripped the arm rest  UPDATE May 19 2014:He is with his caregiver daily, he still works full time, he likes current medication changes, baclofen 20 mg 4 times a day, Valium 2 mg twice a day, Flexeril 10 mg 3 times a day, I have added on tizanidine 2 mg 3 times a day, works well,  UPDATE 12/06/14 [name] returns with his caregiver. He is having more spasticity first thing in the morning and before bed. He is currently on baclofen 20 mg, 4 times a day, Flexeril 10 mg 3 times a day, Valium 2 mg twice daily and tizanidine 2 mg 3 times daily. He returns for reevaluation  REVIEW OF SYSTEMS: Full 14 system review of  systems performed and notable only for those listed, all others are neg:  Constitutional: neg  Cardiovascular: neg Ear/Nose/Throat: neg  Skin: neg Eyes: neg Respiratory: neg Gastroitestinal: Constipation Hematology/Lymphatic: Easy bruising  Endocrine: neg Musculoskeletal:neg Allergy/Immunology: neg Neurological: Speech difficulty Psychiatric: neg Sleep : neg   ALLERGIES: Allergies  Allergen Reactions  . Betadine [Povidone Iodine]   . Ciprofloxacin   . Morphine     HOME MEDICATIONS: Outpatient Prescriptions Prior to Visit  Medication Sig Dispense Refill  . acetaminophen (TYLENOL) 325 MG tablet Take 650 mg by mouth every 6 (six) hours as needed. For pain    . baclofen (LIORESAL) 20 MG tablet Take 1 tablet (20 mg total) by mouth 4 (four) times daily. 360 each 3  . calcium carbonate (TUMS - DOSED IN MG ELEMENTAL CALCIUM) 500 MG chewable tablet Chew 1 tablet by mouth 3 (three) times daily.    . cetirizine (ZYRTEC) 10 MG tablet Take 10 mg by mouth daily.    . cholecalciferol (VITAMIN D) 1000 UNITS tablet Take 1,000 Units by mouth 2 (two) times daily.    . cyclobenzaprine (FLEXERIL) 10 MG tablet Take 1 tablet (10 mg total) by mouth 3 (three) times daily. 270 tablet 3  . diazepam (VALIUM) 2 MG tablet Take 1 tablet (2 mg total) by mouth 2 (two) times daily. 60 tablet 5  . glycerin adult (GLYCERIN ADULT) 2 G SUPP Place 1 suppository rectally every other day.    . hydrochlorothiazide (HYDRODIURIL) 25 MG tablet Take 25 mg by mouth daily.    Marland Kitchen loperamide (IMODIUM) 2 MG capsule Take  2 mg by mouth 3 (three) times daily as needed. For diarrhea    . metoprolol (TOPROL-XL) 100 MG 24 hr tablet Take 100 mg by mouth daily.     . Multiple Vitamin (MULITIVITAMIN WITH MINERALS) TABS Take 1 tablet by mouth daily.    . nitrofurantoin (MACRODANTIN) 50 MG capsule Take 50 mg by mouth daily.    . sodium phosphate (FLEET) enema Place 1 enema rectally See admin instructions. As directed rectally once daily  if no BM in 3 days    . tizanidine (ZANAFLEX) 2 MG capsule Take 1 capsule (2 mg total) by mouth 3 (three) times daily. 270 capsule 3  . traMADol (ULTRAM) 50 MG tablet Take by mouth every 6 (six) hours as needed.    . warfarin (COUMADIN) 2 MG tablet Take 0.5 mg by mouth daily. Every day    . warfarin (COUMADIN) 4 MG tablet Take 4 mg by mouth. Takes on Tuesday, Thursday, Saturday, and Sunday     No facility-administered medications prior to visit.    PAST MEDICAL HISTORY: Past Medical History  Diagnosis Date  . Cerebral palsy ([place])   . Recurrent UTI   . Coagulopathy ([place])   . Insomnia   . Tachycardia   . Chronic constipation   . Hypertension     PAST SURGICAL HISTORY: Past Surgical History  Procedure Laterality Date  . Small intestine surgery    . Incise and drain abcess  09/11/10  . Elbow surgery  09/12/10    left  . Tooth extraction  11/22/2011    Procedure: DENTAL RESTORATION/EXTRACTIONS;  Surgeon: Marcelo Baldy, MD;  Location: [place];  Service: Dentistry;  Laterality: N/A;  dental restoration and cleaning; NO EXTRACTIONS    FAMILY HISTORY: History reviewed. No pertinent family history.  SOCIAL HISTORY: Social History   Social History  . Marital Status: Single    Spouse Name: N/A  . Number of Children: N/A  . Years of Education: N/A   Occupational History  . Not on file.   Social History Main Topics  . Smoking status: Never Smoker   . Smokeless tobacco: Never Used  . Alcohol Use: No  . Drug Use: No  . Sexual Activity: No   Other Topics Concern  . Not on file   Social History Narrative     PHYSICAL EXAM  Filed Vitals:   12/06/14 1457  BP: 113/73  Pulse: 100  Height: 5\' 10"  (1.778 m)  Weight: 163 lb 6.4 oz (74.118 kg)   Body mass index is 23.45 kg/(m^2). Generalized: In no acute distress Neck: Supple, no carotid bruits  Cardiac: Regular rate rhythm Pulmonary: Clear to auscultation bilaterally Musculoskeletal: No deformity  Neurological  examination Mentation: he sits in wheelchair, slow spastic dysarthria, Cranial nerve II-XII: Pupils were equal round reactive to light. Extraocular movements were full. Visual field were full on confrontational test.Facial sensation and strength were normal. Hearing was intact to finger rubbing bilaterally. Uvula tongue midline. He has slow spastic tongue movement, Motor: Profound spasticity of bilateral upper and lower extremities,right arm has relatively free movement,tends to stay in right shoulder extension, elbow extension, wrist finger flexion, more profound left upper extremity spasticity, with left shoulder extension, elbow flexion, wrist extension, finger flexion, Trace movement of left foot, bilateral knee contraction. Limbs were hold in place by straps. Sensory: Intact to fine touch, pinprick, Gait: deferred Deep tendon reflexes: Hypoactive and symmetric    DIAGNOSTIC DATA (LABS, IMAGING, TESTING) -  ASSESSMENT AND PLAN  34 y.o. year old  male  has a past medical history of Cerebral palsy ([place]); ; Coagulopathy ([place]); Insomnia; Tachycardia; Chronic constipation; and Hypertension. here to follow-up  PLAN: Continue baclofen at current dose does not need refills Increase Flexeril to 1 tablet 8 a.m. 1 tablet around noon and 2 tabs at bedtime will renew prescription Continue Valium 2 mg twice daily will renew Continue Zanaflex 2 mg 3 times daily Follow-up in 6-8 months Dennie Bible, [facility], [facility], APRN  [facility] [address], [place], [place] [address] [phone]

## 2014-12-06 NOTE — Patient Instructions (Signed)
Continue baclofen at current dose does not need refills Increase Flexeril to 1 tablet 8 a.m. 1 tablet around noon and 2 tabs at bedtime will renew prescription Continue Valium 2 mg twice daily will renew Continue Zanaflex 2 mg 3 times daily Follow-up in 6-8 months

## 2014-12-07 NOTE — Progress Notes (Signed)
I have reviewed and agreed above plan. 

## 2015-05-11 ENCOUNTER — Ambulatory Visit (INDEPENDENT_AMBULATORY_CARE_PROVIDER_SITE_OTHER): Payer: Medicare Other | Admitting: Family Medicine

## 2015-05-11 ENCOUNTER — Ambulatory Visit (INDEPENDENT_AMBULATORY_CARE_PROVIDER_SITE_OTHER): Payer: Medicare Other

## 2015-05-11 VITALS — BP 118/68 | HR 150 | Temp 102.2°F | Resp 20

## 2015-05-11 DIAGNOSIS — R509 Fever, unspecified: Secondary | ICD-10-CM | POA: Diagnosis not present

## 2015-05-11 DIAGNOSIS — J22 Unspecified acute lower respiratory infection: Secondary | ICD-10-CM

## 2015-05-11 DIAGNOSIS — R062 Wheezing: Secondary | ICD-10-CM

## 2015-05-11 DIAGNOSIS — Z7901 Long term (current) use of anticoagulants: Secondary | ICD-10-CM

## 2015-05-11 DIAGNOSIS — R000 Tachycardia, unspecified: Secondary | ICD-10-CM

## 2015-05-11 DIAGNOSIS — N390 Urinary tract infection, site not specified: Secondary | ICD-10-CM

## 2015-05-11 DIAGNOSIS — J988 Other specified respiratory disorders: Secondary | ICD-10-CM

## 2015-05-11 LAB — POCT URINALYSIS DIP (MANUAL ENTRY)
BILIRUBIN UA: NEGATIVE
Bilirubin, UA: NEGATIVE
GLUCOSE UA: NEGATIVE
Leukocytes, UA: NEGATIVE
Nitrite, UA: POSITIVE — AB
Spec Grav, UA: 1.02
UROBILINOGEN UA: 1
pH, UA: 7

## 2015-05-11 LAB — POCT CBC
Granulocyte percent: 85.7 %G — AB (ref 37–80)
HCT, POC: 38.3 % — AB (ref 43.5–53.7)
HEMOGLOBIN: 12.8 g/dL — AB (ref 14.1–18.1)
LYMPH, POC: 1 (ref 0.6–3.4)
MCH, POC: 30.6 pg (ref 27–31.2)
MCHC: 33.4 g/dL (ref 31.8–35.4)
MCV: 91.5 fL (ref 80–97)
MID (cbc): 0.4 (ref 0–0.9)
MPV: 6.5 fL (ref 0–99.8)
POC GRANULOCYTE: 8.7 — AB (ref 2–6.9)
POC LYMPH %: 9.9 % — AB (ref 10–50)
POC MID %: 4.4 % (ref 0–12)
Platelet Count, POC: 143 10*3/uL (ref 142–424)
RBC: 4.18 M/uL — AB (ref 4.69–6.13)
RDW, POC: 16.6 %
WBC: 10.2 10*3/uL (ref 4.6–10.2)

## 2015-05-11 LAB — POC MICROSCOPIC URINALYSIS (UMFC): Mucus: ABSENT

## 2015-05-11 LAB — POCT INFLUENZA A/B
Influenza A, POC: NEGATIVE
Influenza B, POC: NEGATIVE

## 2015-05-11 MED ORDER — AMOXICILLIN-POT CLAVULANATE 875-125 MG PO TABS: 1.0000 | ORAL_TABLET | Freq: Two times a day (BID) | ORAL | Status: DC

## 2015-05-11 NOTE — Progress Notes (Signed)
Subjective:    Patient ID: [name], male    DOB: August 29, 1980, 35 y.o.   MRN: [id number]  HPI This is a pleasant male who is brought in today by his caregiver [name]) who lives with him. [name] has CP and is wheelchair bound. He is able to communicate some himself. He presents today with fever/chills, headache that started yesterday. Headache over eyes, 5/10. Feels SOB, some wheezing earlier today  Has history of bowel obstruction. Last BM 2 days ago. No nausea, no vomiting, no abdominal pain. Decreased appetite since yesterday evening.    He is on coumadin, had INR checked 4 days ago and it was 3.5. No adjustments were made at that time.    Past Medical History  Diagnosis Date  . Cerebral palsy ([place])   . Recurrent UTI   . Coagulopathy ([place])   . Insomnia   . Tachycardia   . Chronic constipation   . Hypertension    Past Surgical History  Procedure Laterality Date  . Small intestine surgery    . Incise and drain abcess  09/11/10  . Elbow surgery  09/12/10    left  . Tooth extraction  11/22/2011    Procedure: DENTAL RESTORATION/EXTRACTIONS;  Surgeon: Marcelo Baldy, MD;  Location: [place];  Service: Dentistry;  Laterality: N/A;  dental restoration and cleaning; NO EXTRACTIONS   No family history on file.   Review of Systems  Constitutional: Positive for fever, chills and appetite change (decreased).  HENT: Positive for sore throat (intermittent). Negative for congestion, ear pain and rhinorrhea.   Respiratory: Positive for cough (occasional, non productive started today.), shortness of breath and wheezing.   Genitourinary: Negative for dysuria and hematuria.  Neurological: Positive for headaches (over eyes, 5/10).       Objective:   Physical Exam  Constitutional: He appears well-developed and well-nourished.  HENT:  Head: Normocephalic and atraumatic.  Right Ear: External ear normal.  Left Ear: External ear normal.  Eyes: Conjunctivae are normal.  Cardiovascular:  Regular rhythm and normal heart sounds.  Tachycardia present.   Pulmonary/Chest: Effort normal. No respiratory distress. He has decreased breath sounds (lower half posteriorly).  Abdominal: Soft. Bowel sounds are normal. He exhibits distension (mild distension). There is no tenderness. There is no rebound and no guarding.  Genitourinary:  Urine bag with amber colored urine.   Musculoskeletal:  Unable to control arms/legs, arms with spasticity.   Neurological: He is alert.  Answers questions appropriately.   Skin: Skin is warm. He is diaphoretic (mildly).  Psychiatric: He has a normal mood and affect.  Vitals reviewed.  BP 118/68 mmHg  Pulse 145  Temp(Src) 102.8 F (39.3 C) (Oral)  Resp 20 Acetaminophen 1,000 mg given at 10:45 am with bottle of Gatorade Patient reports headache improved slightly Results for orders placed or performed in visit on 05/11/15  POCT urinalysis dipstick  Result Value Ref Range   Color, UA yellow yellow   Clarity, UA clear clear   Glucose, UA negative negative   Bilirubin, UA negative negative   Ketones, POC UA negative negative   Spec Grav, UA 1.020    Blood, UA trace-intact (A) negative   pH, UA 7.0    Protein Ur, POC trace (A) negative   Urobilinogen, UA 1.0    Nitrite, UA Positive (A) Negative   Leukocytes, UA Negative Negative  POCT Microscopic Urinalysis ([facility])  Result Value Ref Range   WBC,UR,HPF,POC Moderate (A) None WBC/hpf   RBC,UR,HPF,POC None None RBC/hpf   Bacteria  Many (A) None, Too numerous to count   Mucus Absent Absent   Epithelial Cells, UR Per Microscopy Few (A) None, Too numerous to count cells/hpf  POCT Influenza A/B  Result Value Ref Range   Influenza A, POC Negative Negative   Influenza B, POC Negative Negative  POCT CBC  Result Value Ref Range   WBC 10.2 4.6 - 10.2 K/uL   Lymph, poc 1.0 0.6 - 3.4   POC LYMPH PERCENT 9.9 (A) 10 - 50 %L   MID (cbc) 0.4 0 - 0.9   POC MID % 4.4 0 - 12 %M   POC Granulocyte 8.7 (A) 2 -  6.9   Granulocyte percent 85.7 (A) 37 - 80 %G   RBC 4.18 (A) 4.69 - 6.13 M/uL   Hemoglobin 12.8 (A) 14.1 - 18.1 g/dL   HCT, POC 38.3 (A) 43.5 - 53.7 %   MCV 91.5 80 - 97 fL   MCH, POC 30.6 27 - 31.2 pg   MCHC 33.4 31.8 - 35.4 g/dL   RDW, POC 16.6 %   Platelet Count, POC 143 142 - 424 K/uL   MPV 6.5 0 - 99.8 fL   Dg Chest 2 View  05/11/2015  CLINICAL DATA:  Chest pain. EXAM: CHEST  2 VIEW COMPARISON:  No prior. FINDINGS: Cardiomegaly. Normal pulmonary vascularity. Low lung volumes with bibasilar atelectasis and/or infiltrates. Small left pleural effusion. No pneumothorax . IMPRESSION: 1.  Cardiomegaly. 2. Low lung volumes with basilar atelectasis and/or infiltrates. Small left pleural effusion. Electronically Signed   By: Marcello Moores  Register   On: 05/11/2015 11:40       Assessment & Plan:  Discussed with Dr. Linna Darner 1. Fever, unspecified - viral illness vs. Acute on chronic UTI vs. Lower respiratory infection - POCT urinalysis dipstick - POCT Microscopic Urinalysis ([facility]) - POCT Influenza A/B - POCT CBC - DG Chest 2 View; Future - acetaminophen 1,000 mg every 8 hours prn  2. Tachycardia - POCT urinalysis dipstick - POCT Microscopic Urinalysis ([facility]) - POCT Influenza A/B - POCT CBC - DG Chest 2 View; Future - encouraged increased fluid until urine light yellow  3. Long term current use of anticoagulant therapy - POCT CBC - unable to obtain enough blood to check pt/inr. Discussed with patient's caregiver who checks with home INR monitoring. He will check tomorrow since last INR was high.  4. Wheeze - DG Chest 2 View; Future  5. Urinary tract infection without hematuria, site unspecified - Urine culture - amoxicillin-clavulanate (AUGMENTIN) 875-125 MG tablet; Take 1 tablet by mouth 2 (two) times daily.  Dispense: 14 tablet; Refill: 0  6. Lower resp. tract infection - amoxicillin-clavulanate (AUGMENTIN) 875-125 MG tablet; Take 1 tablet by mouth 2 (two) times daily.  Dispense:  14 tablet; Refill: 0 - discussed importance of deep breathing, hydration. He can use OTC mucinex, delsym PRN - RTC precautions reviewed- worsening headache or any trouble with breathing, fever after 48 hours of antibiotics, vomiting.  Clarene Reamer, FNP-BC  [facility]  05/11/2015 9:10 PM

## 2015-05-11 NOTE — Progress Notes (Signed)
History and exam discussed with Tor Netters, FNP.  With the fever, disability and exam agree with diagnostic work up.  Rather err on over-treatment for the viral illness vs. Urinary vs pulmonary.  Discussed choice of antibiotics since quinolone not an option.  Agree with plan.  Fenton Malling. Linna Darner MD

## 2015-05-11 NOTE — Patient Instructions (Addendum)
Please start antibiotic today, take with food and full glass of water If symptoms worsen, please come back in or go to ER  Upper Respiratory Infection, Adult Most upper respiratory infections (URIs) are a viral infection of the air passages leading to the lungs. A URI affects the nose, throat, and upper air passages. The most common type of URI is nasopharyngitis and is typically referred to as "the common cold." URIs run their course and usually go away on their own. Most of the time, a URI does not require medical attention, but sometimes a bacterial infection in the upper airways can follow a viral infection. This is called a secondary infection. Sinus and middle ear infections are common types of secondary upper respiratory infections. Bacterial pneumonia can also complicate a URI. A URI can worsen asthma and chronic obstructive pulmonary disease (COPD). Sometimes, these complications can require emergency medical care and may be life threatening.  CAUSES Almost all URIs are caused by viruses. A virus is a type of germ and can spread from one person to another.  RISKS FACTORS You may be at risk for a URI if:   You smoke.   You have chronic heart or lung disease.  You have a weakened defense (immune) system.   You are very young or very old.   You have nasal allergies or asthma.  You work in crowded or poorly ventilated areas.  You work in health care facilities or schools. SIGNS AND SYMPTOMS  Symptoms typically develop 2-3 days after you come in contact with a cold virus. Most viral URIs last 7-10 days. However, viral URIs from the influenza virus (flu virus) can last 14-18 days and are typically more severe. Symptoms may include:   Runny or stuffy (congested) nose.   Sneezing.   Cough.   Sore throat.   Headache.   Fatigue.   Fever.   Loss of appetite.   Pain in your forehead, behind your eyes, and over your cheekbones (sinus pain).  Muscle aches.   DIAGNOSIS  Your health care provider may diagnose a URI by:  Physical exam.  Tests to check that your symptoms are not due to another condition such as:  Strep throat.  Sinusitis.  Pneumonia.  Asthma. TREATMENT  A URI goes away on its own with time. It cannot be cured with medicines, but medicines may be prescribed or recommended to relieve symptoms. Medicines may help:  Reduce your fever.  Reduce your cough.  Relieve nasal congestion. HOME CARE INSTRUCTIONS   Take medicines only as directed by your health care provider.   Gargle warm saltwater or take cough drops to comfort your throat as directed by your health care provider.  Use a warm mist humidifier or inhale steam from a shower to increase air moisture. This may make it easier to breathe.  Drink enough fluid to keep your urine clear or pale yellow.   Eat soups and other clear broths and maintain good nutrition.   Rest as needed.   Return to work when your temperature has returned to normal or as your health care provider advises. You may need to stay home longer to avoid infecting others. You can also use a face mask and careful hand washing to prevent spread of the virus.  Increase the usage of your inhaler if you have asthma.   Do not use any tobacco products, including cigarettes, chewing tobacco, or electronic cigarettes. If you need help quitting, ask your health care provider. PREVENTION  The best  way to protect yourself from getting a cold is to practice good hygiene.   Avoid oral or hand contact with people with cold symptoms.   Wash your hands often if contact occurs.  There is no clear evidence that vitamin C, vitamin E, echinacea, or exercise reduces the chance of developing a cold. However, it is always recommended to get plenty of rest, exercise, and practice good nutrition.  SEEK MEDICAL CARE IF:   You are getting worse rather than better.   Your symptoms are not controlled by  medicine.   You have chills.  You have worsening shortness of breath.  You have brown or red mucus.  You have yellow or brown nasal discharge.  You have pain in your face, especially when you bend forward.  You have a fever.  You have swollen neck glands.  You have pain while swallowing.  You have white areas in the back of your throat. SEEK IMMEDIATE MEDICAL CARE IF:   You have severe or persistent:  Headache.  Ear pain.  Sinus pain.  Chest pain.  You have chronic lung disease and any of the following:  Wheezing.  Prolonged cough.  Coughing up blood.  A change in your usual mucus.  You have a stiff neck.  You have changes in your:  Vision.  Hearing.  Thinking.  Mood. MAKE SURE YOU:   Understand these instructions.  Will watch your condition.  Will get help right away if you are not doing well or get worse.   This information is not intended to replace advice given to you by your health care provider. Make sure you discuss any questions you have with your health care provider.   Document Released: 07/10/2000 Document Revised: 05/31/2014 Document Reviewed: 04/21/2013 Elsevier Interactive Patient Education 2016 [employer].     IF you received an x-ray today, you will receive an invoice from [facility]. Please contact [facility] at [phone] with questions or concerns regarding your invoice.   IF you received labwork today, you will receive an invoice from [employer]. Please contact [facility] at [phone] with questions or concerns regarding your invoice.   Our billing staff will not be able to assist you with questions regarding bills from these companies.  You will be contacted with the lab results as soon as they are available. The fastest way to get your results is to activate your My Chart account. Instructions are located on the last page of this paperwork. If you have not heard from  [place] regarding the results in 2 weeks, please contact this office.

## 2015-05-12 LAB — URINE CULTURE

## 2015-05-13 ENCOUNTER — Emergency Department (HOSPITAL_COMMUNITY): Payer: Medicare Other

## 2015-05-13 ENCOUNTER — Inpatient Hospital Stay (HOSPITAL_COMMUNITY)
Admission: EM | Admit: 2015-05-13 | Discharge: 2015-05-16 | DRG: 872 | Disposition: A | Discharge status: Home / Self Care | Payer: Medicare Other | Attending: Internal Medicine | Admitting: Internal Medicine

## 2015-05-13 ENCOUNTER — Encounter (HOSPITAL_COMMUNITY): Payer: Self-pay | Admitting: Nurse Practitioner

## 2015-05-13 ENCOUNTER — Ambulatory Visit (INDEPENDENT_AMBULATORY_CARE_PROVIDER_SITE_OTHER): Payer: Medicare Other | Admitting: Family Medicine

## 2015-05-13 VITALS — BP 125/85 | HR 128 | Temp 97.0°F | Resp 18

## 2015-05-13 DIAGNOSIS — G809 Cerebral palsy, unspecified: Secondary | ICD-10-CM | POA: Diagnosis not present

## 2015-05-13 DIAGNOSIS — N319 Neuromuscular dysfunction of bladder, unspecified: Secondary | ICD-10-CM | POA: Diagnosis present

## 2015-05-13 DIAGNOSIS — N390 Urinary tract infection, site not specified: Secondary | ICD-10-CM | POA: Diagnosis present

## 2015-05-13 DIAGNOSIS — Z7901 Long term (current) use of anticoagulants: Secondary | ICD-10-CM

## 2015-05-13 DIAGNOSIS — I517 Cardiomegaly: Secondary | ICD-10-CM | POA: Diagnosis present

## 2015-05-13 DIAGNOSIS — Z86718 Personal history of other venous thrombosis and embolism: Secondary | ICD-10-CM | POA: Diagnosis not present

## 2015-05-13 DIAGNOSIS — L03116 Cellulitis of left lower limb: Secondary | ICD-10-CM | POA: Diagnosis not present

## 2015-05-13 DIAGNOSIS — I119 Hypertensive heart disease without heart failure: Secondary | ICD-10-CM | POA: Diagnosis present

## 2015-05-13 DIAGNOSIS — L0390 Cellulitis, unspecified: Secondary | ICD-10-CM

## 2015-05-13 DIAGNOSIS — L03317 Cellulitis of buttock: Secondary | ICD-10-CM | POA: Diagnosis present

## 2015-05-13 DIAGNOSIS — A4151 Sepsis due to Escherichia coli [E. coli]: Secondary | ICD-10-CM | POA: Diagnosis not present

## 2015-05-13 DIAGNOSIS — G4700 Insomnia, unspecified: Secondary | ICD-10-CM | POA: Diagnosis present

## 2015-05-13 DIAGNOSIS — G801 Spastic diplegic cerebral palsy: Secondary | ICD-10-CM | POA: Diagnosis present

## 2015-05-13 DIAGNOSIS — K5909 Other constipation: Secondary | ICD-10-CM | POA: Diagnosis present

## 2015-05-13 DIAGNOSIS — M7989 Other specified soft tissue disorders: Secondary | ICD-10-CM

## 2015-05-13 DIAGNOSIS — A419 Sepsis, unspecified organism: Secondary | ICD-10-CM | POA: Diagnosis present

## 2015-05-13 DIAGNOSIS — I10 Essential (primary) hypertension: Secondary | ICD-10-CM | POA: Diagnosis not present

## 2015-05-13 DIAGNOSIS — M79605 Pain in left leg: Secondary | ICD-10-CM | POA: Diagnosis not present

## 2015-05-13 DIAGNOSIS — L0291 Cutaneous abscess, unspecified: Secondary | ICD-10-CM | POA: Diagnosis not present

## 2015-05-13 LAB — CBC WITH DIFFERENTIAL/PLATELET
BASOS ABS: 0 10*3/uL (ref 0.0–0.1)
BASOS PCT: 0 %
EOS PCT: 2 %
Eosinophils Absolute: 0.1 10*3/uL (ref 0.0–0.7)
HCT: 42.1 % (ref 39.0–52.0)
Hemoglobin: 14.2 g/dL (ref 13.0–17.0)
LYMPHS ABS: 1.9 10*3/uL (ref 0.7–4.0)
LYMPHS PCT: 28 %
MCH: 30 pg (ref 26.0–34.0)
MCHC: 33.7 g/dL (ref 30.0–36.0)
MCV: 89 fL (ref 78.0–100.0)
MONO ABS: 0.9 10*3/uL (ref 0.1–1.0)
MONOS PCT: 13 %
NEUTROS ABS: 3.9 10*3/uL (ref 1.7–7.7)
NEUTROS PCT: 57 %
PLATELETS: 181 10*3/uL (ref 150–400)
RBC: 4.73 MIL/uL (ref 4.22–5.81)
RDW: 14.2 % (ref 11.5–15.5)
WBC: 6.9 10*3/uL (ref 4.0–10.5)

## 2015-05-13 LAB — PROTIME-INR
INR: 2.94 — AB (ref 0.00–1.49)
Prothrombin Time: 30.1 seconds — ABNORMAL HIGH (ref 11.6–15.2)

## 2015-05-13 LAB — COMPREHENSIVE METABOLIC PANEL
ALBUMIN: 3.4 g/dL — AB (ref 3.5–5.0)
ALT: 27 U/L (ref 17–63)
AST: 31 U/L (ref 15–41)
Alkaline Phosphatase: 55 U/L (ref 38–126)
Anion gap: 12 (ref 5–15)
BUN: 10 mg/dL (ref 6–20)
CHLORIDE: 99 mmol/L — AB (ref 101–111)
CO2: 29 mmol/L (ref 22–32)
CREATININE: 1.05 mg/dL (ref 0.61–1.24)
Calcium: 9.2 mg/dL (ref 8.9–10.3)
GFR calc Af Amer: >60 mL/min (ref >60)
GLUCOSE: 117 mg/dL — AB (ref 65–99)
POTASSIUM: 3.8 mmol/L (ref 3.5–5.1)
SODIUM: 140 mmol/L (ref 135–145)
Total Bilirubin: 1.1 mg/dL (ref 0.3–1.2)
Total Protein: 6.8 g/dL (ref 6.5–8.1)

## 2015-05-13 LAB — URINALYSIS, ROUTINE W REFLEX MICROSCOPIC
Glucose, UA: NEGATIVE mg/dL
KETONES UR: 15 mg/dL — AB
NITRITE: NEGATIVE
PROTEIN: 30 mg/dL — AB
Specific Gravity, Urine: >1.046 — ABNORMAL HIGH (ref 1.005–1.030)
pH: 7.5 (ref 5.0–8.0)

## 2015-05-13 LAB — URINE MICROSCOPIC-ADD ON

## 2015-05-13 LAB — I-STAT CG4 LACTIC ACID, ED
LACTIC ACID, VENOUS: 4.23 mmol/L — AB (ref 0.5–2.0)
LACTIC ACID, VENOUS: 0.68 mmol/L (ref 0.5–2.0)

## 2015-05-13 MED ORDER — VANCOMYCIN HCL IN DEXTROSE 1-5 GM/200ML-% IV SOLN
1000.0000 mg | Freq: Two times a day (BID) | INTRAVENOUS | Status: DC
  Administered 2015-05-13 – 2015-05-15 (×4): 1000 mg via INTRAVENOUS
  Filled 2015-05-13 (×5): qty 200

## 2015-05-13 MED ORDER — SODIUM CHLORIDE 0.9 % IV SOLN
INTRAVENOUS | Status: DC
  Administered 2015-05-13: 18:00:00 via INTRAVENOUS

## 2015-05-13 MED ORDER — SODIUM CHLORIDE 0.9 % IV BOLUS (SEPSIS)
500.0000 mL | Freq: Once | INTRAVENOUS | Status: AC
  Administered 2015-05-13: 500 mL via INTRAVENOUS

## 2015-05-13 MED ORDER — IOPAMIDOL (ISOVUE-300) INJECTION 61%
INTRAVENOUS | Status: AC
  Administered 2015-05-13: 100 mL
  Filled 2015-05-13: qty 100

## 2015-05-13 MED ORDER — SODIUM CHLORIDE 0.9 % IV BOLUS (SEPSIS)
1000.0000 mL | Freq: Once | INTRAVENOUS | Status: AC
  Administered 2015-05-13: 1000 mL via INTRAVENOUS

## 2015-05-13 MED ORDER — FENTANYL CITRATE (PF) 100 MCG/2ML IJ SOLN
100.0000 ug | INTRAMUSCULAR | Status: DC | PRN
  Administered 2015-05-13: 100 ug via INTRAVENOUS
  Filled 2015-05-13: qty 2

## 2015-05-13 MED ORDER — LORAZEPAM 2 MG/ML IJ SOLN
1.0000 mg | INTRAMUSCULAR | Status: DC | PRN
  Administered 2015-05-13 (×2): 1 mg via INTRAVENOUS
  Filled 2015-05-13 (×2): qty 1

## 2015-05-13 MED ORDER — PIPERACILLIN-TAZOBACTAM 3.375 G IVPB 30 MIN
3.3750 g | Freq: Once | INTRAVENOUS | Status: AC
  Administered 2015-05-13: 3.375 g via INTRAVENOUS
  Filled 2015-05-13: qty 50

## 2015-05-13 MED ORDER — ACETAMINOPHEN 325 MG PO TABS
650.0000 mg | ORAL_TABLET | Freq: Once | ORAL | Status: AC
  Administered 2015-05-13: 650 mg via ORAL
  Filled 2015-05-13: qty 2

## 2015-05-13 MED ORDER — SODIUM CHLORIDE 0.9 % IV BOLUS (SEPSIS)
1000.0000 mL | Freq: Once | INTRAVENOUS | Status: AC
Start: 2015-05-13 — End: 2015-05-13
  Administered 2015-05-13: 1000 mL via INTRAVENOUS

## 2015-05-13 NOTE — Patient Instructions (Addendum)
Go to the [facility] emergency room for evaluation with either an ultrasound or CT scan of the left hip to look for a possible abscess. Lab work will also necessary to assess the infection there. Blood thinner will need to be checked once again.    IF you received an x-ray today, you will receive an invoice from [facility]. Please contact [facility] at [phone] with questions or concerns regarding your invoice.   IF you received labwork today, you will receive an invoice from [employer]. Please contact [facility] at [phone] with questions or concerns regarding your invoice.   Our billing staff will not be able to assist you with questions regarding bills from these companies.  You will be contacted with the lab results as soon as they are available. The fastest way to get your results is to activate your My Chart account. Instructions are located on the last page of this paperwork. If you have not heard from [place] regarding the results in 2 weeks, please contact this office.

## 2015-05-13 NOTE — ED Provider Notes (Signed)
CSN: [id number]     Arrival date & time 05/13/15  1559 History   First MD Initiated Contact with Patient 05/13/15 1644     Chief Complaint  Patient presents with  . Cellulitis     (Consider location/radiation/quality/duration/timing/severity/associated sxs/prior Treatment) HPI   [name] is a 35 y.o. male who presents for evaluation of redness and swelling of the left hip region, present for 4 days, and worsening. Initially he had headache and malaise, which progressed to visualize swelling, which has worsened. He was seen 4 days ago in the clinic, and prescribed "Augmentin for pneumonia." Despite this, his symptoms progressed. He is using Tylenol, for fever. There has been no nausea, vomiting, weakness or dizziness. There's been no change in his mentation, or chronic musculoskeletal disorder associated with his cerebral palsy. No prior similar problem. There are no other modifying factors.   Past Medical History  Diagnosis Date  . Cerebral palsy ([place])   . Recurrent UTI   . Coagulopathy ([place])   . Insomnia   . Tachycardia   . Chronic constipation   . Hypertension    Past Surgical History  Procedure Laterality Date  . Small intestine surgery    . Incise and drain abcess  09/11/10  . Elbow surgery  09/12/10    left  . Tooth extraction  11/22/2011    Procedure: DENTAL RESTORATION/EXTRACTIONS;  Surgeon: Marcelo Baldy, MD;  Location: [place];  Service: Dentistry;  Laterality: N/A;  dental restoration and cleaning; NO EXTRACTIONS   Family History  Problem Relation Age of Onset  . Family history unknown: Yes   Social History  Substance Use Topics  . Smoking status: Never Smoker   . Smokeless tobacco: Never Used  . Alcohol Use: No    Review of Systems  All other systems reviewed and are negative.     Allergies  Betadine; Ciprofloxacin; and Morphine  Home Medications   Prior to Admission medications   Medication Sig Start Date End Date Taking? Authorizing Provider   acetaminophen (TYLENOL) 325 MG tablet Take 650 mg by mouth every 6 (six) hours as needed. For pain    Historical Provider, MD  baclofen (LIORESAL) 20 MG tablet Take 1 tablet (20 mg total) by mouth 4 (four) times daily. 05/19/14   Marcial Pacas, MD  calcium carbonate (TUMS - DOSED IN MG ELEMENTAL CALCIUM) 500 MG chewable tablet Chew 1 tablet by mouth 3 (three) times daily.    Historical Provider, MD  cetirizine (ZYRTEC) 10 MG tablet Take 10 mg by mouth daily.    Historical Provider, MD  cholecalciferol (VITAMIN D) 1000 UNITS tablet Take 1,000 Units by mouth 2 (two) times daily.    Historical Provider, MD  cyclobenzaprine (FLEXERIL) 10 MG tablet 1 at 8am and 12pm and 2 tabs at 8pm. 12/06/14   Dennie Bible, NP  diazepam (VALIUM) 2 MG tablet Take 1 tablet (2 mg total) by mouth 2 (two) times daily. 12/06/14   Dennie Bible, NP  glycerin adult (GLYCERIN ADULT) 2 G SUPP Place 1 suppository rectally every other day.    Historical Provider, MD  hydrochlorothiazide (HYDRODIURIL) 25 MG tablet Take 25 mg by mouth daily.    Historical Provider, MD  loperamide (IMODIUM) 2 MG capsule Take 2 mg by mouth 3 (three) times daily as needed. Reported on 05/11/2015    Historical Provider, MD  metoprolol (TOPROL-XL) 100 MG 24 hr tablet Take 100 mg by mouth daily.     Historical Provider, MD  Multiple Vitamin [facility]  WITH MINERALS) TABS Take 1 tablet by mouth daily.    Historical Provider, MD  nitrofurantoin (MACRODANTIN) 50 MG capsule Take 50 mg by mouth daily.    Historical Provider, MD  sodium phosphate (FLEET) enema Place 1 enema rectally See admin instructions. Reported on 05/11/2015    Historical Provider, MD  tizanidine (ZANAFLEX) 2 MG capsule Take 1 capsule (2 mg total) by mouth 3 (three) times daily. 05/19/14   Marcial Pacas, MD  traMADol (ULTRAM) 50 MG tablet Take by mouth every 6 (six) hours as needed.    Historical Provider, MD  warfarin (COUMADIN) 1 MG tablet Take 0.5 mg by mouth. Takes Tuesday,  Thursday, sat, and sun    Historical Provider, MD  warfarin (COUMADIN) 3 MG tablet Take 3 mg by mouth daily.    Historical Provider, MD   BP 122/82 mmHg  Pulse 92  Temp(Src) 97.5 F (36.4 C) (Axillary)  Resp 24  SpO2 98% Physical Exam  Constitutional: He is oriented to person, place, and time. He appears well-developed and well-nourished. He appears distressed (He is uncomfortable).  Stigmata of cerebral palsy  HENT:  Head: Normocephalic and atraumatic.  Right Ear: External ear normal.  Left Ear: External ear normal.  Oral mucous members are somewhat dry.  Eyes: Conjunctivae and EOM are normal. Pupils are equal, round, and reactive to light.  Neck: Normal range of motion and phonation normal. Neck supple.  Cardiovascular: Normal rate, regular rhythm and normal heart sounds.   Pulmonary/Chest: Effort normal and breath sounds normal. He exhibits no bony tenderness.  Abdominal: Soft. There is no tenderness.  Musculoskeletal:  Area over left lateral hip, from the superior iliac spine, to the mid femur, has irregular erythema, warmth, and induration. There is no palpable abscess. There is no palpable crepitation.  Neurological: He is alert and oriented to person, place, and time. No cranial nerve deficit or sensory deficit. He exhibits normal muscle tone. Coordination normal.  Moderate spasticity of arms, with involuntary muscle movement. Legs have increased tone, but are essentially immobile.  Skin: Skin is warm, dry and intact.  Psychiatric: He has a normal mood and affect. His behavior is normal.  Nursing note and vitals reviewed.   ED Course  Procedures (including critical care time)  Medications  vancomycin (VANCOCIN) IVPB 1000 mg/200 mL premix (0 mg Intravenous Stopped 05/13/15 1914)  LORazepam (ATIVAN) injection 1 mg (1 mg Intravenous Given 05/13/15 2319)  fentaNYL (SUBLIMAZE) injection 100 mcg (100 mcg Intravenous Given 05/13/15 1757)  0.9 %  sodium chloride infusion (  Intravenous New Bag/Given 05/13/15 1803)  sodium chloride 0.9 % bolus 500 mL (not administered)  acetaminophen (TYLENOL) tablet 650 mg (650 mg Oral Given 05/13/15 1721)  sodium chloride 0.9 % bolus 1,000 mL (0 mLs Intravenous Stopped 05/13/15 1914)  sodium chloride 0.9 % bolus 1,000 mL (0 mLs Intravenous Stopped 05/13/15 1937)  piperacillin-tazobactam (ZOSYN) IVPB 3.375 g (0 g Intravenous Stopped 05/13/15 1853)  iopamidol (ISOVUE-300) 61 % injection (100 mLs  Contrast Given 05/13/15 1837)    Patient Vitals for the past 24 hrs:  BP Temp Temp src Pulse Resp SpO2  05/13/15 2319 - 97.5 F (36.4 C) Axillary - - -  05/13/15 2245 122/82 mmHg - - 92 24 98 %  05/13/15 2215 120/85 mmHg - - 90 18 98 %  05/13/15 2200 125/86 mmHg - - 92 19 96 %  05/13/15 2145 130/95 mmHg - - 95 16 99 %  05/13/15 2130 121/88 mmHg - - 95 20 98 %  05/13/15 2115 121/93 mmHg - - 95 17 99 %  05/13/15 2100 133/87 mmHg - - 99 19 99 %  05/13/15 2045 133/90 mmHg - - 95 21 100 %  05/13/15 2030 134/92 mmHg - - 102 22 100 %  05/13/15 2000 125/97 mmHg - - 100 15 100 %  05/13/15 1930 134/92 mmHg - - 105 24 100 %  05/13/15 1915 134/99 mmHg - - 109 21 100 %  05/13/15 1914 141/92 mmHg 98.3 F (36.8 C) Axillary 109 20 100 %  05/13/15 1826 121/62 mmHg - - 117 20 99 %  05/13/15 1745 117/79 mmHg - - (!) 125 (!) 34 97 %  05/13/15 1730 127/80 mmHg - - (!) 127 23 98 %  05/13/15 1724 - - - (!) 128 (!) 33 98 %  05/13/15 1721 126/82 mmHg - - - - 96 %  05/13/15 1633 (!) 140/101 mmHg 99.2 F (37.3 C) Axillary (!) 132 17 97 %    10:13 PM-Consult complete with Hospitalist. Patient case explained and discussed. He agrees to admit patient for further evaluation and treatment. Call ended at 2020  10:26 PM Reevaluation with update and discussion. After initial assessment and treatment, an updated evaluation reveals no change in clinical status. Patient updated on findings, all questions answered. Sheika Coutts L    CRITICAL CARE Performed by:  Daleen Bo L Total critical care time: 35 minutes Critical care time was exclusive of separately billable procedures and treating other patients. Critical care was necessary to treat or prevent imminent or life-threatening deterioration. Critical care was time spent personally by me on the following activities: development of treatment plan with patient and/or surrogate as well as nursing, discussions with consultants, evaluation of patient's response to treatment, examination of patient, obtaining history from patient or surrogate, ordering and performing treatments and interventions, ordering and review of laboratory studies, ordering and review of radiographic studies, pulse oximetry and re-evaluation of patient's condition.   Labs Review Labs Reviewed  COMPREHENSIVE METABOLIC PANEL - Abnormal; Notable for the following:    Chloride 99 (*)    Glucose, Bld 117 (*)    Albumin 3.4 (*)    All other components within normal limits  URINALYSIS, ROUTINE W REFLEX MICROSCOPIC (NOT AT [facility]) - Abnormal; Notable for the following:    Color, Urine AMBER (*)    APPearance CLOUDY (*)    Specific Gravity, Urine >1.046 (*)    Hgb urine dipstick SMALL (*)    Bilirubin Urine SMALL (*)    Ketones, ur 15 (*)    Protein, ur 30 (*)    Leukocytes, UA SMALL (*)    All other components within normal limits  PROTIME-INR - Abnormal; Notable for the following:    Prothrombin Time 30.1 (*)    INR 2.94 (*)    All other components within normal limits  URINE MICROSCOPIC-ADD ON - Abnormal; Notable for the following:    Squamous Epithelial / LPF 0-5 (*)    Bacteria, UA MANY (*)    Casts HYALINE CASTS (*)    Crystals CA OXALATE CRYSTALS (*)    All other components within normal limits  I-STAT CG4 LACTIC ACID, ED - Abnormal; Notable for the following:    Lactic Acid, Venous 4.23 (*)    All other components within normal limits  CULTURE, BLOOD (ROUTINE X 2)  CULTURE, BLOOD (ROUTINE X 2)  URINE CULTURE  CBC  WITH DIFFERENTIAL/PLATELET  I-STAT CG4 LACTIC ACID, ED    Imaging Review Ct Hip Left W  Contrast  05/13/2015  CLINICAL DATA:  Acute onset of erythema, swelling and pain at the left upper hip. Initial encounter. EXAM: CT OF THE LEFT HIP WITH CONTRAST TECHNIQUE: Multidetector CT imaging was performed following the standard protocol during bolus administration of intravenous contrast. CONTRAST:  1 ISOVUE-300 IOPAMIDOL (ISOVUE-300) INJECTION 61% COMPARISON:  CTA of the abdomen and pelvis performed 06/06/2012 FINDINGS: There is no evidence of fracture or dislocation. The proximal left femur appears intact. The visualized portions of the sacroiliac joint are unremarkable. Diffuse skin thickening is noted along the anterolateral left thigh, with underlying soft tissue edema extending to the level of the fascia. The underlying musculature appears grossly intact. 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 Mild surrounding soft tissue inflammation is seen. There is no definite evidence of abscess formation. IMPRESSION: 1. No evidence of fracture or dislocation. 2. No evidence of abscess. 3. Diffuse skin thickening along the anterolateral left thigh, with underlying soft tissue edema extending to the level of the fascia. 4. 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 Mild surrounding soft tissue inflammation noted. Electronically Signed   By: Garald Balding M.D.   On: 05/13/2015 20:07   I have personally reviewed and evaluated these images and lab results as part of my medical decision-making.   EKG Interpretation None      MDM   Final diagnoses:  Cellulitis     Cellulitis without deep tissue infection or hemodynamic instability. Initial elevated lactate  improved with IV bolus. Doubt severe sepsis. 5. Spectrum antibiotics begun empirically, may need to be narrowed. He will require admission for observation. Further treatment, to ensure stability.  Nursing Notes Reviewed/ Care Coordinated, and agree without changes. Applicable Imaging Reviewed.  Interpretation of Laboratory Data incorporated into ED treatment  Name: Admit    Daleen Bo, MD 05/13/15 2357

## 2015-05-13 NOTE — ED Notes (Signed)
Pt c/o 1 day history of  redness, swelling and pain to L upper hip area. He went to [facility] and they sent to ED to r/o abscess, infection.

## 2015-05-13 NOTE — H&P (Signed)
History and Physical  Patient Name: [name]     [id number]    DOB: 23-Nov-1980    DOA: 05/13/2015 Referring provider: Mila Palmer, MD PCP: Benito Mccreedy, MD  Outpatient specialists: Neurology, Dr. Krista Blue, Infectious disease (in the past) Dr. Wendie Agreste Patient coming from: Home  Chief Complaint: Fever and hips swelling  HPI: [name] is a 35 y.o. male with a past medical history significant for spastic CP, HTN, neurogenic bladder and history of DVT on warfarin who presents with fever and hip swelling.  All history is collected from the patient, who is a reliable historian, and from the chart.  First developed HA about 3 days ago.  Saw his PCP, had fever to 102F, difficult to localize, and so Augmentin was started for what sounded like pneumonia.   Flu negative at that time, urine grew multiple species.  WBC 10.2K.  Took Augmentin, but no real change in status and next day saw area of redness and warmth on LEFT hip.  Went back to PCP, where HR 120s, and was sent to ER.  In the ED, he had low grade temp, tachycardia 132, RR 33, BP normal.  Na 140, K 3.8, Cr normal, lactate 4.23, WBC 6.9K.  CT of the hip showed only cellulitis without fluid collection.  Blood and urine cultures were obtained, fluids and IV antibiotics were administered, and TRH were asked to evaluate for admission.  Lactate resolved with fluids.  The patient had a spontaneous abscess of the left elbow 5 years ago requiring surgical I&D, causing MSSA bacteremia, which was treated with 4 weeks abx and resolved without incident.  No other infections.     Review of Systems:  Pt complains of HA, fever, malaise. Pt denies any cough, dyspnea, sputum.  Denies dysyuria, bladder pain, pain at condom catheter.  All other systems negative except as just noted or noted in the history of present illness.    Past Medical History  Diagnosis Date  . Cerebral palsy ([place])   . Recurrent UTI   . Coagulopathy ([place])   .  Insomnia   . Tachycardia   . Chronic constipation   . Hypertension     Past Surgical History  Procedure Laterality Date  . Small intestine surgery    . Incise and drain abcess  09/11/10  . Elbow surgery  09/12/10    left  . Tooth extraction  11/22/2011    Procedure: DENTAL RESTORATION/EXTRACTIONS;  Surgeon: Marcelo Baldy, MD;  Location: [facility];  Service: Dentistry;  Laterality: N/A;  dental restoration and cleaning; NO EXTRACTIONS    Social History: Patient lives with a roommate.  He has caregivers who come to the house.  He does not smoke. He is from [place] and went to [place].      Allergies  Allergen Reactions  . Betadine [Povidone Iodine]   . Ciprofloxacin   . Morphine     Family history: Family history is unknown by patient.  Prior to Admission medications   Medication Sig Start Date End Date Taking? Authorizing Provider  acetaminophen (TYLENOL) 325 MG tablet Take 650 mg by mouth every 6 (six) hours as needed. For pain    Historical Provider, MD  baclofen (LIORESAL) 20 MG tablet Take 1 tablet (20 mg total) by mouth 4 (four) times daily. 05/19/14   Marcial Pacas, MD  calcium carbonate (TUMS - DOSED IN MG ELEMENTAL CALCIUM) 500 MG chewable tablet Chew 1 tablet by mouth 3 (three) times daily.    Historical Provider, MD  cetirizine (ZYRTEC) 10 MG tablet Take 10 mg by mouth daily.    Historical Provider, MD  cholecalciferol (VITAMIN D) 1000 UNITS tablet Take 1,000 Units by mouth 2 (two) times daily.    Historical Provider, MD  cyclobenzaprine (FLEXERIL) 10 MG tablet 1 at 8am and 12pm and 2 tabs at 8pm. 12/06/14   Dennie Bible, NP  diazepam (VALIUM) 2 MG tablet Take 1 tablet (2 mg total) by mouth 2 (two) times daily. 12/06/14   Dennie Bible, NP  glycerin adult (GLYCERIN ADULT) 2 G SUPP Place 1 suppository rectally every other day.    Historical Provider, MD  hydrochlorothiazide (HYDRODIURIL) 25 MG tablet Take 25 mg by mouth daily.    Historical Provider, MD  loperamide  (IMODIUM) 2 MG capsule Take 2 mg by mouth 3 (three) times daily as needed. Reported on 05/11/2015    Historical Provider, MD  metoprolol (TOPROL-XL) 100 MG 24 hr tablet Take 100 mg by mouth daily.     Historical Provider, MD  Multiple Vitamin (MULITIVITAMIN WITH MINERALS) TABS Take 1 tablet by mouth daily.    Historical Provider, MD  nitrofurantoin (MACRODANTIN) 50 MG capsule Take 50 mg by mouth daily.    Historical Provider, MD  sodium phosphate (FLEET) enema Place 1 enema rectally See admin instructions. Reported on 05/11/2015    Historical Provider, MD  tizanidine (ZANAFLEX) 2 MG capsule Take 1 capsule (2 mg total) by mouth 3 (three) times daily. 05/19/14   Marcial Pacas, MD  traMADol (ULTRAM) 50 MG tablet Take by mouth every 6 (six) hours as needed.    Historical Provider, MD  warfarin (COUMADIN) 1 MG tablet Take 0.5 mg by mouth. Takes Tuesday, Thursday, sat, and sun    Historical Provider, MD  warfarin (COUMADIN) 3 MG tablet Take 3 mg by mouth daily.    Historical Provider, MD       Physical Exam: BP 122/82 mmHg  Pulse 92  Temp(Src) 97.5 F (36.4 C) (Axillary)  Resp 24  SpO2 98% General appearance: Normal weight adult male, alert and in no acute distress. Lying in bed, responsive to questions.  Eyes: Anicteric, conjunctiva pink, lids and lashes normal.     ENT: No nasal deformity, discharge, or epistaxis.  OP tacky dry, poor dentition.   Lymph: No cervical or supraclavicular lymphadenopathy. Skin: Warm and dry.  There is splotchy redness on the LEFT hip, in a large area, about 30 cm in diameter.  No mottling. Cardiac: Tachycardic, nl S1-S2, no murmurs appreciated.  Capillary refill is brisk.  JVP normal.  No LE edema but compression hose in place.  Radial pulses 2+ and symmetric. Respiratory: Normal respiratory rate and rhythm.  CTAB without rales or wheezes. Abdomen: Abdomen soft without rigidity.  No TTP. No ascites, mild distension, hard to tell because of spasticity.   MSK: Diffuse  contractures, CP deformities.  Left hip joint exam limited by patient spasticity, do not detect pain with flexion at this time. Neuro: Oriented to place, time, and situation.  Speech dysarthric.  Spastic tone throughout.  Moves both upper extremities.  Responding to questions and attention normal.  PERRL and EOMI.  Tongue atrophic on R.   Psych: Affect pleasant.  No evidence of aural or visual hallucinations or delusions.       Labs on Admission:  I have personally reviewed the following studies: The metabolic panel shows normal sodium, potassium, carbonate, and renal function. Abdomen slightly low, transaminases and bilirubin normal. INR 3. Lactate 4.23, corrected to 0.6  and after fluids. Blood cultures pending.  Urinalysis with many bacteria, scant WBCs. The complete blood count shows no leukocytosis, anemia, thrombocytopenia.   Radiological Exams on Admission: Personally reviewed chest x-ray from 2 days ago shows no focal opacities.  Ct Hip Left W Contrast  05/13/2015  CLINICAL DATA:  Acute onset of erythema, swelling and pain at the left upper hip. Initial encounter. EXAM: CT OF THE LEFT HIP WITH CONTRAST TECHNIQUE: Multidetector CT imaging was performed following the standard protocol during bolus administration of intravenous contrast. CONTRAST:  1 ISOVUE-300 IOPAMIDOL (ISOVUE-300) INJECTION 61% COMPARISON:  CTA of the abdomen and pelvis performed 06/06/2012 FINDINGS: There is no evidence of fracture or dislocation. The proximal left femur appears intact. The visualized portions of the sacroiliac joint are unremarkable. Diffuse skin thickening is noted along the anterolateral left thigh, with underlying soft tissue edema extending to the level of the fascia. The underlying musculature appears grossly intact. 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 Mild  surrounding soft tissue inflammation is seen. There is no definite evidence of abscess formation. IMPRESSION: 1. No evidence of fracture or dislocation. 2. No evidence of abscess. 3. Diffuse skin thickening along the anterolateral left thigh, with underlying soft tissue edema extending to the level of the fascia. 4. 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 Mild surrounding soft tissue inflammation noted. Electronically Signed   By: Garald Balding M.D.   On: 05/13/2015 20:07        Assessment/Plan 1. Sepsis from cellulitis:  Suspected source cellulitis. Organism unknown. Patient meets criteria given tachycardia, tachypnea, and evidence of organ dysfunction.  Lactate 4.23 mmol/L and repeat normalized with fluids.  This patient is not at high risk of poor outcomes with a SOFA score of 1.  Antibiotics delivered in the ED.    -Sepsis bundle utilized:  -Blood and urine cultures drawn  -30 ml/kg bolus given in ED, will repeat lactic acid  -Start targeted antibiotics with vancomycin and piperacillin-tazobactam, based on suspected source of infection (non-purulent cellulitis with elevated lactate)   -Repeat renal function and complete blood count in AM  -Code SEPSIS called to E-link     2. CP with spasticity:  -Continue home baclofen, cyclobenzaprine, diazepam, and tizanidine  3. Neurogenic bladder:  Hold home nitrofurantoin, restart when discharged  4. Hx of DVT:  -Continue warfarin, dosed per pharmacy  5. HTN:  -Hold home HCTZ, BB for now, restart when hemodynamics are stable      DVT prophylaxis: Warfarin  Code Status: FULL  Family Communication: None present   Disposition Plan: Anticipate IV antibiotics and monitor HR.   Monitor cultures. De-escalate when able. Consults called: None  Admission status: I recommend admission to medical surgical bed,  inpatient status.  Clinical condition: currently stable and HR improving and BP normal.   Patient evaluated at 2300 on 05/13/2015   DUQUAN WELZ [facility] Pager [phone]

## 2015-05-13 NOTE — Progress Notes (Signed)
Patient ID: [name], male    DOB: 01/24/1981  Age: 35 y.o. MRN: [id number]  Chief Complaint  Patient presents with  . Blood Clot    in left leg, red and swollen since last Thursday (has forms to be filled out after visit)    Subjective:   Patient with cerebral palsy, cared for by caregivers. He is wheelchair-bound, has a [employer] representative wheelchair. Patient is alert and oriented and can talk and answer questions. His caregiver is with him. He had a fever and was sick a couple of days ago, is never clearly discussed harming. Question of lungs versus urine and he was placed on Augmentin. However that night when they went home he was found to have an area of erythema on his left hip. It is gotten worse and more painful and tender and feels swollen. Therefore he was brought back in here. No known trauma.  Current allergies, medications, problem list, past/family and social histories reviewed.  Objective:  BP 125/85 mmHg  Pulse 128  Temp(Src) 97 F (36.1 C) (Axillary)  Resp 18  SpO2 96%  Middle-age man with cerebral palsy, reclining in a wheelchair. Neck supple. Chest clear. Heart regular without murmurs. Abdomen soft and nontender. He has a condom catheter. Genitalia appear normal. The left hip from about the anterior aspect of the iliac crest around to the buttock feels indurated and swollen, in an area probably around 20 cm in diameter. Nonfluctuant.  He has a history of blood clots, is on Coumadin with an INR of around 2.4, and this does not appear to be a blood clot.  Assessment & Plan:   Assessment: 1. Cellulitis of buttock   2. Cellulitis and abscess   3. Cerebral palsy, unspecified type [facility])       Plan: Patient needs to go to the emergency room. He will require some kind of imaging, either an ultrasound or CT scan to determine if there is a soft tissue infection. Labs and x-rays were not attempted here today. Completed forms.  No orders of the defined types were  placed in this encounter.    No orders of the defined types were placed in this encounter.    Patient needs evaluation in the emergency room with an ultrasound or CT scan of his hip area to look for possible abscess of the deep tissues.     Patient Instructions   Go to the [facility] emergency room for evaluation with either an ultrasound or CT scan of the left hip to look for a possible abscess. Lab work will also necessary to assess the infection there. Blood thinner will need to be checked once again.    IF you received an x-ray today, you will receive an invoice from [facility]. Please contact [facility] at [phone] with questions or concerns regarding your invoice.   IF you received labwork today, you will receive an invoice from [employer]. Please contact [facility] at [phone] with questions or concerns regarding your invoice.   Our billing staff will not be able to assist you with questions regarding bills from these companies.  You will be contacted with the lab results as soon as they are available. The fastest way to get your results is to activate your My Chart account. Instructions are located on the last page of this paperwork. If you have not heard from [place] regarding the results in 2 weeks, please contact this office.          Return  if symptoms worsen or fail to improve.   Kodi Steil, MD 05/13/2015

## 2015-05-13 NOTE — ED Notes (Signed)
Urinalysis collected from condom cath. Bag emptied and new urine collected for specimen from patient.

## 2015-05-13 NOTE — Progress Notes (Signed)
Pharmacy Antibiotic Note  [name] is a 35 y.o. male admitted on 05/13/2015 with cellulitis.  Pharmacy has been consulted for vanc dosing.  35 yo with cerebral palsy who present for likely cellulitis of the hip. He was eval by NP a couple of days ago for that as well as fever and respiratory symptoms. He was prescribed Augmentin. The cellulitis has gotten worse so he was sent to the ED.    On coumadin for DVT?  Vanc trough goal = 10-15  Plan:   Vanc 1g IV q12  Temp (24hrs), Avg:98.1 F (36.7 C), Min:97 F (36.1 C), Max:99.2 F (37.3 C)   Recent Labs Lab 05/11/15 1112  WBC 10.2    CrCl cannot be calculated (Unknown ideal weight.).    Allergies  Allergen Reactions  . Betadine [Povidone Iodine]   . Ciprofloxacin   . Morphine     Antimicrobials this admission: 4/15 vanc >>   Dose adjustments this admission:  Microbiology results: 4/15 BCx:  4/15 UCx:  Sputum:  MRSA PCR:   Thank you for allowing pharmacy to be a part of this patient's care.  [name], PharmD Pager: [phone] 05/13/2015 5:04 PM

## 2015-05-14 LAB — BASIC METABOLIC PANEL
ANION GAP: 9 (ref 5–15)
BUN: 10 mg/dL (ref 6–20)
CHLORIDE: 104 mmol/L (ref 101–111)
CO2: 26 mmol/L (ref 22–32)
Calcium: 7.7 mg/dL — ABNORMAL LOW (ref 8.9–10.3)
Creatinine, Ser: 0.86 mg/dL (ref 0.61–1.24)
GFR calc Af Amer: >60 mL/min (ref >60)
GFR calc non Af Amer: >60 mL/min (ref >60)
GLUCOSE: 88 mg/dL (ref 65–99)
POTASSIUM: 3.9 mmol/L (ref 3.5–5.1)
Sodium: 139 mmol/L (ref 135–145)

## 2015-05-14 LAB — LACTIC ACID, PLASMA
Lactic Acid, Venous: 0.7 mmol/L (ref 0.5–2.0)
Lactic Acid, Venous: 1.7 mmol/L (ref 0.5–2.0)

## 2015-05-14 LAB — CBC
HCT: 34.4 % — ABNORMAL LOW (ref 39.0–52.0)
HEMOGLOBIN: 11.8 g/dL — AB (ref 13.0–17.0)
MCH: 30.6 pg (ref 26.0–34.0)
MCHC: 34.3 g/dL (ref 30.0–36.0)
MCV: 89.4 fL (ref 78.0–100.0)
PLATELETS: 140 10*3/uL — AB (ref 150–400)
RBC: 3.85 MIL/uL — AB (ref 4.22–5.81)
RDW: 14.6 % (ref 11.5–15.5)
WBC: 5.2 10*3/uL (ref 4.0–10.5)

## 2015-05-14 MED ORDER — METOPROLOL TARTRATE 1 MG/ML IV SOLN
5.0000 mg | INTRAVENOUS | Status: DC | PRN
  Filled 2015-05-14: qty 5

## 2015-05-14 MED ORDER — CALCIUM CARBONATE ANTACID 500 MG PO CHEW
1.0000 | CHEWABLE_TABLET | Freq: Three times a day (TID) | ORAL | Status: DC
  Administered 2015-05-14 – 2015-05-16 (×7): 200 mg via ORAL
  Filled 2015-05-14 (×7): qty 1

## 2015-05-14 MED ORDER — CYCLOBENZAPRINE HCL 10 MG PO TABS
20.0000 mg | ORAL_TABLET | Freq: Every day | ORAL | Status: DC
  Administered 2015-05-14 – 2015-05-15 (×2): 20 mg via ORAL
  Filled 2015-05-14 (×2): qty 2

## 2015-05-14 MED ORDER — HYDROMORPHONE HCL 1 MG/ML IJ SOLN: 0.5000 mg | INTRAMUSCULAR | Status: DC | PRN

## 2015-05-14 MED ORDER — SODIUM CHLORIDE 0.9 % IV SOLN: INTRAVENOUS | Status: AC

## 2015-05-14 MED ORDER — ONDANSETRON HCL 4 MG/2ML IJ SOLN
4.0000 mg | Freq: Four times a day (QID) | INTRAMUSCULAR | Status: DC | PRN
  Filled 2015-05-14: qty 2

## 2015-05-14 MED ORDER — DIAZEPAM 2 MG PO TABS
2.0000 mg | ORAL_TABLET | Freq: Two times a day (BID) | ORAL | Status: DC
  Administered 2015-05-14 – 2015-05-16 (×5): 2 mg via ORAL
  Filled 2015-05-14 (×5): qty 1

## 2015-05-14 MED ORDER — BACLOFEN 10 MG PO TABS
20.0000 mg | ORAL_TABLET | Freq: Four times a day (QID) | ORAL | Status: DC
  Administered 2015-05-14 – 2015-05-16 (×9): 20 mg via ORAL
  Filled 2015-05-14 (×9): qty 2

## 2015-05-14 MED ORDER — GLYCERIN (LAXATIVE) 2 G RE SUPP
1.0000 | RECTAL | Status: DC
  Filled 2015-05-14 (×2): qty 1

## 2015-05-14 MED ORDER — WARFARIN SODIUM 3 MG PO TABS
3.5000 mg | ORAL_TABLET | ORAL | Status: DC
  Administered 2015-05-14: 3.5 mg via ORAL
  Filled 2015-05-14: qty 1

## 2015-05-14 MED ORDER — PIPERACILLIN-TAZOBACTAM 3.375 G IVPB
3.3750 g | Freq: Three times a day (TID) | INTRAVENOUS | Status: DC
  Administered 2015-05-14 – 2015-05-16 (×7): 3.375 g via INTRAVENOUS
  Filled 2015-05-14 (×9): qty 50

## 2015-05-14 MED ORDER — CYCLOBENZAPRINE HCL 10 MG PO TABS: 10.0000 mg | ORAL_TABLET | Freq: Three times a day (TID) | ORAL | Status: DC

## 2015-05-14 MED ORDER — ONDANSETRON HCL 4 MG PO TABS: 4.0000 mg | ORAL_TABLET | Freq: Four times a day (QID) | ORAL | Status: DC | PRN

## 2015-05-14 MED ORDER — CYCLOBENZAPRINE HCL 10 MG PO TABS
10.0000 mg | ORAL_TABLET | Freq: Two times a day (BID) | ORAL | Status: DC
  Administered 2015-05-14 – 2015-05-16 (×5): 10 mg via ORAL
  Filled 2015-05-14 (×5): qty 1

## 2015-05-14 MED ORDER — METOPROLOL TARTRATE 50 MG PO TABS
50.0000 mg | ORAL_TABLET | Freq: Two times a day (BID) | ORAL | Status: DC
  Administered 2015-05-14 – 2015-05-16 (×4): 50 mg via ORAL
  Filled 2015-05-14 (×4): qty 1

## 2015-05-14 MED ORDER — TRAMADOL HCL 50 MG PO TABS
50.0000 mg | ORAL_TABLET | Freq: Four times a day (QID) | ORAL | Status: DC | PRN
  Administered 2015-05-14: 50 mg via ORAL
  Filled 2015-05-14 (×2): qty 1

## 2015-05-14 MED ORDER — TIZANIDINE HCL 2 MG PO TABS
2.0000 mg | ORAL_TABLET | Freq: Three times a day (TID) | ORAL | Status: DC
  Administered 2015-05-14 – 2015-05-16 (×7): 2 mg via ORAL
  Filled 2015-05-14 (×8): qty 1

## 2015-05-14 MED ORDER — WARFARIN - PHARMACIST DOSING INPATIENT: Freq: Every day | Status: DC

## 2015-05-14 MED ORDER — LORATADINE 10 MG PO TABS
10.0000 mg | ORAL_TABLET | Freq: Every day | ORAL | Status: DC
  Administered 2015-05-14 – 2015-05-16 (×3): 10 mg via ORAL
  Filled 2015-05-14 (×3): qty 1

## 2015-05-14 MED ORDER — WARFARIN SODIUM 3 MG PO TABS: 3.0000 mg | ORAL_TABLET | ORAL | Status: DC

## 2015-05-14 NOTE — Progress Notes (Signed)
ANTICOAGULATION CONSULT NOTE - Initial Consult  Pharmacy Consult for Warfarin  Indication: Hx DVT  Allergies  Allergen Reactions  . Betadine [Povidone Iodine]   . Ciprofloxacin   . Morphine     Vital Signs: Temp: 97.5 F (36.4 C) (04/16 1047) Temp Source: Axillary (04/16 1047) BP: 116/74 mmHg (04/16 1047) Pulse Rate: 116 (04/16 1047)  Labs:  Recent Labs  05/13/15 1500 05/14/15 0330  HGB 14.2 11.8*  HCT 42.1 34.4*  PLT 181 140*  LABPROT 30.1*  --   INR 2.94*  --   CREATININE 1.05 0.86    CrCl cannot be calculated (Unknown ideal weight.).   Medical History: Past Medical History  Diagnosis Date  . Cerebral palsy ([place])   . Recurrent UTI   . Coagulopathy ([place])   . Insomnia   . Tachycardia   . Chronic constipation   . Hypertension    Assessment: On chronic warfarin therapy for history of DVT, INR on admit is 2.94. Spoke to his care giver today and he stated that the pt current regimen is 3mg  qday with 0.5mg  TTSS so it's basically 3.5mg  qday except for 3mg  MWF  Goal of Therapy:  INR 2-3 Monitor platelets by anticoagulation protocol: Yes   Plan:   Cont coumadin 3.5mg  qday except 3mg  MWF Daily INR   Onnie Boer, PharmD Pager: [phone] 05/14/2015 12:20 PM

## 2015-05-14 NOTE — Progress Notes (Signed)
Utilization review completed.  

## 2015-05-14 NOTE — Progress Notes (Signed)
PROGRESS NOTE                                                                                                                                                                                                             Patient Demographics:    [name], is a 35 y.o. male, DOB - [birth year], TO:[id number]  Admit date - 05/13/2015   Admitting Physician Edwin Dada, MD  Outpatient Primary MD for the patient is OSEI-BONSU,GEORGE, MD  LOS - 1  Outpatient Specialists:   Chief Complaint  Patient presents with  . Cellulitis       Brief Narrative      Subjective:    [name] today has, No headache, No chest pain, No abdominal pain - No Nausea, No new weakness tingling or numbness, No Cough - SOB.    Assessment  & Plan :     1. Sepsis due to Left buttock cellulitis and UTI. Patient appears nontoxic, abscess physiology has resolved, continue gentle hydration and empiric IV antibiotics, monitor cultures, CT scan of the left hip noted without any abscess or joint involvement.  2. Cerebral palsy with neurogenic bladder. Bladder ultrasound stable, no retention, continue condom catheter and supportive care. UTI as #1 above. Continue supportive care with muscle relaxants for chronic spasticity and contract chest. He lives at home with a caregiver.  3. History of DVT. On Coumadin INR therapeutic pharmacy monitoring.  4. Distress spontaneous left elbow abscess 5 years ago. Stable.  5. Essential hypertension. Continue beta blocker. HCTZ on hold.    Code Status : Full  Family Communication  : talked with parents  Disposition Plan  : Home 2-3 days  Barriers For Discharge : UTI  Consults  :  None  Procedures  :    CT L Hip - 1. No evidence of fracture or dislocation. 2. No evidence of abscess. 3. Diffuse skin thickening along the anterolateral left thigh, with underlying soft tissue edema  extending to the level of the fascia. 4. 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 Mild surrounding soft tissue inflammation noted.  DVT Prophylaxis  :  Coumadin  Lab Results  Component Value Date   INR 2.94* 05/13/2015   INR 1.39 11/22/2011  INR 1.45 06/10/2011     Lab Results  Component Value Date   PLT 140* 05/14/2015    Antibiotics  :    Anti-infectives    Start     Dose/Rate Route Frequency Ordered Stop   05/14/15 0330  piperacillin-tazobactam (ZOSYN) IVPB 3.375 g     3.375 g 12.5 mL/hr over 240 Minutes Intravenous 3 times per day 05/14/15 0328     05/13/15 1800  vancomycin (VANCOCIN) IVPB 1000 mg/200 mL premix     1,000 mg 200 mL/hr over 60 Minutes Intravenous Every 12 hours 05/13/15 1705     05/13/15 1800  piperacillin-tazobactam (ZOSYN) IVPB 3.375 g     3.375 g 100 mL/hr over 30 Minutes Intravenous  Once 05/13/15 1750 05/13/15 1853        Objective:   Filed Vitals:   05/13/15 2319 05/14/15 0001 05/14/15 0415 05/14/15 1047  BP:  117/69 126/59 116/74  Pulse:  99 93 116  Temp: 97.5 F (36.4 C) 98.4 F (36.9 C) 98 F (36.7 C) 97.5 F (36.4 C)  TempSrc: Axillary Axillary Axillary Axillary  Resp:  20 18 21   SpO2:  100% 100% 99%    Wt Readings from Last 3 Encounters:  12/06/14 74.118 kg (163 lb 6.4 oz)  06/06/11 81.647 kg (180 lb)  02/16/11 68.04 kg (150 lb)     Intake/Output Summary (Last 24 hours) at 05/14/15 1410 Last data filed at 05/14/15 0340  Gross per 24 hour  Intake   2000 ml  Output    500 ml  Net   1500 ml     Physical Exam  Awake Alert, chronic contractures,  [name],PERRAL, Poor dental hygiene Supple Neck,No JVD, No cervical lymphadenopathy appriciated.  Symmetrical Chest wall movement, Good air movement bilaterally, CTAB RRR,No Gallops,Rubs or new Murmurs, No Parasternal Heave +ve B.Sounds, Abd Soft, No  tenderness, No organomegaly appriciated, No rebound - guarding or rigidity. No Cyanosis, Clubbing or edema, No new Rash or bruise, condom catheter    Data Review:    CBC  Recent Labs Lab 05/11/15 1112 05/13/15 1500 05/14/15 0330  WBC 10.2 6.9 5.2  HGB 12.8* 14.2 11.8*  HCT 38.3* 42.1 34.4*  PLT  --  181 140*  MCV 91.5 89.0 89.4  MCH 30.6 30.0 30.6  MCHC 33.4 33.7 34.3  RDW  --  14.2 14.6  LYMPHSABS  --  1.9  --   MONOABS  --  0.9  --   EOSABS  --  0.1  --   BASOSABS  --  0.0  --     Chemistries   Recent Labs Lab 05/13/15 1500 05/14/15 0330  NA 140 139  K 3.8 3.9  CL 99* 104  CO2 29 26  GLUCOSE 117* 88  BUN 10 10  CREATININE 1.05 0.86  CALCIUM 9.2 7.7*  AST 31  --   ALT 27  --   ALKPHOS 55  --   BILITOT 1.1  --    ------------------------------------------------------------------------------------------------------------------ No results for input(s): CHOL, HDL, LDLCALC, TRIG, CHOLHDL, LDLDIRECT in the last 72 hours.  No results found for: HGBA1C ------------------------------------------------------------------------------------------------------------------ No results for input(s): TSH, T4TOTAL, T3FREE, THYROIDAB in the last 72 hours.  Invalid input(s): FREET3 ------------------------------------------------------------------------------------------------------------------ No results for input(s): VITAMINB12, FOLATE, FERRITIN, TIBC, IRON, RETICCTPCT in the last 72 hours.  Coagulation profile  Recent Labs Lab 05/13/15 1500  INR 2.94*    No results for input(s): DDIMER in the last 72 hours.  Cardiac Enzymes No results for input(s): CKMB, TROPONINI,  MYOGLOBIN in the last 168 hours.  Invalid input(s): CK ------------------------------------------------------------------------------------------------------------------ No results found for: BNP  Inpatient Medications  Scheduled Meds: . baclofen  20 mg Oral QID  . calcium carbonate  1 tablet  Oral TID  . cyclobenzaprine  10 mg Oral BID WC   And  . cyclobenzaprine  20 mg Oral Q2000  . diazepam  2 mg Oral BID  . glycerin adult  1 suppository Rectal QODAY  . loratadine  10 mg Oral Daily  . piperacillin-tazobactam (ZOSYN)  IV  3.375 g Intravenous 3 times per day  . tiZANidine  2 mg Oral TID  . vancomycin  1,000 mg Intravenous Q12H  . [START ON 05/15/2015] warfarin  3 mg Oral Once per day on Mon Wed Fri  . warfarin  3.5 mg Oral Once per day on Sun Tue Thu Sat  . Warfarin - Pharmacist Dosing Inpatient   Does not apply q1800   Continuous Infusions: . sodium chloride     PRN Meds:.ondansetron **OR** ondansetron (ZOFRAN) IV, traMADol  Micro Results Recent Results (from the past 240 hour(s))  Urine culture     Status: None   Collection Time: 05/11/15 11:13 AM  Result Value Ref Range Status   Colony Count >=100,000 COLONIES/ML  Final   Organism ID, Bacteria Multiple bacterial morphotypes present, none  Final   Organism ID, Bacteria predominant. Suggest appropriate recollection if   Final   Organism ID, Bacteria clinically indicated.  Final  Culture, blood (Routine x 2)     Status: None (Preliminary result)   Collection Time: 05/13/15  5:30 PM  Result Value Ref Range Status   Specimen Description BLOOD LEFT WRIST  Final   Special Requests BOTTLES DRAWN AEROBIC AND ANAEROBIC 5CC  Final   Culture  Setup Time   Final    GRAM POSITIVE COCCI IN CLUSTERS ANAEROBIC BOTTLE ONLY CRITICAL RESULT CALLED TO, READ BACK BY AND VERIFIED WITH: G. NRU,RN AT [id number] ON [id number] BY Rhea Bleacher    Culture PENDING  Incomplete   Report Status PENDING  Incomplete    Radiology Reports Dg Chest 2 View  05/11/2015  CLINICAL DATA:  Chest pain. EXAM: CHEST  2 VIEW COMPARISON:  No prior. FINDINGS: Cardiomegaly. Normal pulmonary vascularity. Low lung volumes with bibasilar atelectasis and/or infiltrates. Small left pleural effusion. No pneumothorax . IMPRESSION: 1.  Cardiomegaly. 2. Low lung volumes with  basilar atelectasis and/or infiltrates. Small left pleural effusion. Electronically Signed   By: Marcello Moores  Register   On: 05/11/2015 11:40   Ct Hip Left W Contrast  05/13/2015  CLINICAL DATA:  Acute onset of erythema, swelling and pain at the left upper hip. Initial encounter. EXAM: CT OF THE LEFT HIP WITH CONTRAST TECHNIQUE: Multidetector CT imaging was performed following the standard protocol during bolus administration of intravenous contrast. CONTRAST:  1 ISOVUE-300 IOPAMIDOL (ISOVUE-300) INJECTION 61% COMPARISON:  CTA of the abdomen and pelvis performed 06/06/2012 FINDINGS: There is no evidence of fracture or dislocation. The proximal left femur appears intact. The visualized portions of the sacroiliac joint are unremarkable. Diffuse skin thickening is noted along the anterolateral left thigh, with underlying soft tissue edema extending to the level of the fascia. The underlying musculature appears grossly intact. 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 Mild surrounding soft tissue inflammation is seen. There is no definite evidence of abscess formation. IMPRESSION: 1. No evidence of fracture or  dislocation. 2. No evidence of abscess. 3. Diffuse skin thickening along the anterolateral left thigh, with underlying soft tissue edema extending to the level of the fascia. 4. 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 Mild surrounding soft tissue inflammation noted. Electronically Signed   By: Garald Balding M.D.   On: 05/13/2015 20:07    Time Spent in minutes  25   SINGH,PRASHANT K M.D on 05/14/2015 at 2:10 PM  Between 7am to 7pm - Pager - [phone]  After 7pm go to [email or web address] - password [facility]  [facility] -  Office  [phone]

## 2015-05-14 NOTE — Progress Notes (Signed)
ANTICOAGULATION CONSULT NOTE - Initial Consult  Pharmacy Consult for Warfarin  Indication: Hx DVT  Allergies  Allergen Reactions  . Betadine [Povidone Iodine]   . Ciprofloxacin   . Morphine     Vital Signs: Temp: 98.4 F (36.9 C) (04/16 0001) Temp Source: Axillary (04/16 0001) BP: 117/69 mmHg (04/16 0001) Pulse Rate: 99 (04/16 0001)  Labs:  Recent Labs  05/11/15 1112 05/13/15 1500  HGB 12.8* 14.2  HCT 38.3* 42.1  PLT  --  181  LABPROT  --  30.1*  INR  --  2.94*  CREATININE  --  1.05    CrCl cannot be calculated (Unknown ideal weight.).   Medical History: Past Medical History  Diagnosis Date  . Cerebral palsy ([place])   . Recurrent UTI   . Coagulopathy ([place])   . Insomnia   . Tachycardia   . Chronic constipation   . Hypertension    Assessment: On chronic warfarin therapy for history of DVT, INR on admit is 2.94, CBC/renal function ok, other labs reviewed.   Goal of Therapy:  INR 2-3 Monitor platelets by anticoagulation protocol: Yes   Plan:  -F/U outpatient warfarin dose today when patient awake (RN request) -Daily PT/INR -Monitor for bleeding  [name] 05/14/2015,3:40 AM

## 2015-05-14 NOTE — Progress Notes (Signed)
Pharmacy Antibiotic Note  [name] is a 35 y.o. male admitted on 05/13/2015 with cellulitis.  Pharmacy has been consulted for Zosyn dosing (already on vancomycin). WBC WNL. Renal function good. Lactic acid ahs trended down.   Plan: Zosyn 3.375G IV q8h to be infused over 4 hours Vancomycin as previously ordered F/U infectious work-up  Temp (24hrs), Avg:98.1 F (36.7 C), Min:97 F (36.1 C), Max:99.2 F (37.3 C)   Recent Labs Lab 05/11/15 1112 05/13/15 1500 05/13/15 1727 05/13/15 1957  WBC 10.2 6.9  --   --   CREATININE  --  1.05  --   --   LATICACIDVEN  --   --  4.23* 0.68    CrCl cannot be calculated (Unknown ideal weight.).    Allergies  Allergen Reactions  . Betadine [Povidone Iodine]   . Ciprofloxacin   . Morphine     [name] 05/14/2015 3:28 AM

## 2015-05-15 ENCOUNTER — Inpatient Hospital Stay (HOSPITAL_COMMUNITY): Payer: Medicare Other

## 2015-05-15 DIAGNOSIS — M7989 Other specified soft tissue disorders: Secondary | ICD-10-CM

## 2015-05-15 LAB — PROTIME-INR
INR: 3.76 — AB (ref 0.00–1.49)
Prothrombin Time: 36.3 seconds — ABNORMAL HIGH (ref 11.6–15.2)

## 2015-05-15 NOTE — Progress Notes (Signed)
ANTICOAGULATION CONSULT NOTE - follow up Pharmacy Consult for Warfarin  Indication: Hx DVT  Allergies  Allergen Reactions  . Betadine [Povidone Iodine] Other (See Comments)    Unknown reaction per caregiver  . Ciprofloxacin Other (See Comments)    Unknown reaction per caregiver  . Morphine Other (See Comments)    Unknown reaction per caregiver    Vital Signs: Temp: 97.9 F (36.6 C) (04/17 0535) BP: 107/73 mmHg (04/17 0535) Pulse Rate: 97 (04/17 0535)  Labs:  Recent Labs  05/13/15 1500 05/14/15 0330 05/15/15 0613  HGB 14.2 11.8*  --   HCT 42.1 34.4*  --   PLT 181 140*  --   LABPROT 30.1*  --  36.3*  INR 2.94*  --  3.76*  CREATININE 1.05 0.86  --     CrCl cannot be calculated (Unknown ideal weight.).   Medical History: Past Medical History  Diagnosis Date  . Cerebral palsy ([place])   . Recurrent UTI   . Coagulopathy ([place])   . Insomnia   . Tachycardia   . Chronic constipation   . Hypertension    Assessment: 35 y.o male with cerebral palsy who is on chronic warfarin therapy for history of DVT. INR on admit was therapeutic at  2.94.  Today the INR has increased to above goal, INR = 3.76. INR is supratherapeutic. The patient is here for sepsis due to left buttock cellulitis and UTI.  Left Lower extremity venous duplex unremarkable. No bleeding noted. Hgb 11.8, pltc 181>140K. SCDs on.  Per discussion with patient's care giver on 05/14/15, he stated that the pt current regimen is 3mg  qday with 0.5mg  TTSS so it's basically 3.5mg  qday except for 3mg  MWF  Goal of Therapy:  INR 2-3 Monitor platelets by anticoagulation protocol: Yes   Plan:  Hold coumadin today. Daily INR   Nicole Cella, RPh Clinical Pharmacist Pager: [phone] 05/15/2015 10:58 AM

## 2015-05-15 NOTE — Progress Notes (Signed)
PROGRESS NOTE                                                                                                                                                                                                             Patient Demographics:    [name], is a 35 y.o. male, DOB - Aug 22, 1980, TO:[id number]  Admit date - 05/13/2015   Admitting Physician Edwin Dada, MD  Outpatient Primary MD for the patient is OSEI-BONSU,GEORGE, MD  LOS - 2  Outpatient Specialists:   Chief Complaint  Patient presents with  . Cellulitis       Brief Narrative      Subjective:    [name] today has, No headache, No chest pain, No abdominal pain - No Nausea, No new weakness tingling or numbness, No Cough - SOB. He has no subjective complaints.   Assessment  & Plan :     1. Sepsis due to Left buttock cellulitis and UTI. Patient appears nontoxic, abscess physiology has resolved, continue gentle hydration and empiric IV antibiotics, monitor cultures Prelim 1 out of 2 blood cultures positive likely contamination UC +ve as well, follow for final culture and sensitivity, CT scan of the left hip noted without any abscess or joint involvement.  2. Cerebral palsy with neurogenic bladder. Bladder ultrasound stable, no retention, continue condom catheter and supportive care. UTI as #1 above. Continue supportive care with muscle relaxants for chronic spasticity and contract chest. He lives at home with a caregiver.  3. History of DVT. On Coumadin INR therapeutic pharmacy monitoring. Left Lower extremity venous duplex unremarkable.  4. History of spontaneous left elbow abscess 5 years ago. Stable.  5. Essential hypertension. Continue beta blocker. HCTZ on hold.    Code Status : Full  Family Communication  : talked with parents  Disposition Plan  : Home 2-3 days  Barriers For Discharge : UTI  Consults  :   None  Procedures  :    CT L Hip - 1. No evidence of fracture or dislocation. 2. No evidence of abscess. 3. Diffuse skin thickening along the anterolateral left thigh, with underlying soft tissue edema extending to the level of the fascia. 4. 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 Mild  surrounding soft tissue inflammation noted.  Left lower extremity venous duplex. Preliminary report:There is no obvious evidence of DVT or SVT noted in the visualized veins.  DVT Prophylaxis  :  Coumadin  Lab Results  Component Value Date   INR 3.76* 05/15/2015   INR 2.94* 05/13/2015   INR 1.39 11/22/2011     Lab Results  Component Value Date   PLT 140* 05/14/2015    Antibiotics  :    Anti-infectives    Start     Dose/Rate Route Frequency Ordered Stop   05/14/15 0330  piperacillin-tazobactam (ZOSYN) IVPB 3.375 g     3.375 g 12.5 mL/hr over 240 Minutes Intravenous 3 times per day 05/14/15 0328     05/13/15 1800  vancomycin (VANCOCIN) IVPB 1000 mg/200 mL premix     1,000 mg 200 mL/hr over 60 Minutes Intravenous Every 12 hours 05/13/15 1705     05/13/15 1800  piperacillin-tazobactam (ZOSYN) IVPB 3.375 g     3.375 g 100 mL/hr over 30 Minutes Intravenous  Once 05/13/15 1750 05/13/15 1853        Objective:   Filed Vitals:   05/14/15 1430 05/14/15 1823 05/14/15 2145 05/15/15 0535  BP: 119/75 167/73 105/59 107/73  Pulse: 117 135 99 97  Temp: 97.5 F (36.4 C) 98.5 F (36.9 C) 97.6 F (36.4 C) 97.9 F (36.6 C)  TempSrc: Axillary Axillary Axillary   Resp: 19 23 20 20   SpO2: 100% 100% 99% 100%    Wt Readings from Last 3 Encounters:  12/06/14 74.118 kg (163 lb 6.4 oz)  06/06/11 81.647 kg (180 lb)  02/16/11 68.04 kg (150 lb)     Intake/Output Summary (Last 24 hours) at 05/15/15 1034 Last data filed at 05/15/15 0533  Gross per 24 hour  Intake    370 ml   Output    500 ml  Net   -130 ml     Physical Exam  Awake Alert, chronic contractures,  [place].AT,PERRAL,  Supple Neck,No JVD, No cervical lymphadenopathy appriciated.  Symmetrical Chest wall movement, Good air movement bilaterally, CTAB RRR,No Gallops,Rubs or new Murmurs, No Parasternal Heave +ve B.Sounds, Abd Soft, No tenderness, No organomegaly appriciated, No rebound - guarding or rigidity. No Cyanosis, Clubbing or edema, No new Rash or bruise, condom catheter, left hip cellulitis improved.    Data Review:    CBC  Recent Labs Lab 05/11/15 1112 05/13/15 1500 05/14/15 0330  WBC 10.2 6.9 5.2  HGB 12.8* 14.2 11.8*  HCT 38.3* 42.1 34.4*  PLT  --  181 140*  MCV 91.5 89.0 89.4  MCH 30.6 30.0 30.6  MCHC 33.4 33.7 34.3  RDW  --  14.2 14.6  LYMPHSABS  --  1.9  --   MONOABS  --  0.9  --   EOSABS  --  0.1  --   BASOSABS  --  0.0  --     Chemistries   Recent Labs Lab 05/13/15 1500 05/14/15 0330  NA 140 139  K 3.8 3.9  CL 99* 104  CO2 29 26  GLUCOSE 117* 88  BUN 10 10  CREATININE 1.05 0.86  CALCIUM 9.2 7.7*  AST 31  --   ALT 27  --   ALKPHOS 55  --   BILITOT 1.1  --    ------------------------------------------------------------------------------------------------------------------ No results for input(s): CHOL, HDL, LDLCALC, TRIG, CHOLHDL, LDLDIRECT in the last 72 hours.  No results found for: HGBA1C ------------------------------------------------------------------------------------------------------------------ No results for input(s): TSH, T4TOTAL, T3FREE, THYROIDAB in the last 72  hours.  Invalid input(s): FREET3 ------------------------------------------------------------------------------------------------------------------ No results for input(s): VITAMINB12, FOLATE, FERRITIN, TIBC, IRON, RETICCTPCT in the last 72 hours.  Coagulation profile  Recent Labs Lab 05/13/15 1500 05/15/15 0613  INR 2.94* 3.76*    No results for input(s): DDIMER in the  last 72 hours.  Cardiac Enzymes No results for input(s): CKMB, TROPONINI, MYOGLOBIN in the last 168 hours.  Invalid input(s): CK ------------------------------------------------------------------------------------------------------------------ No results found for: BNP  Inpatient Medications  Scheduled Meds: . baclofen  20 mg Oral QID  . calcium carbonate  1 tablet Oral TID  . cyclobenzaprine  10 mg Oral BID WC   And  . cyclobenzaprine  20 mg Oral Q2000  . diazepam  2 mg Oral BID  . glycerin adult  1 suppository Rectal QODAY  . loratadine  10 mg Oral Daily  . metoprolol tartrate  50 mg Oral BID  . piperacillin-tazobactam (ZOSYN)  IV  3.375 g Intravenous 3 times per day  . tiZANidine  2 mg Oral TID  . vancomycin  1,000 mg Intravenous Q12H  . warfarin  3 mg Oral Once per day on Mon Wed Fri  . warfarin  3.5 mg Oral Once per day on Sun Tue Thu Sat  . Warfarin - Pharmacist Dosing Inpatient   Does not apply q1800   Continuous Infusions:   PRN Meds:.metoprolol, ondansetron **OR** ondansetron (ZOFRAN) IV, traMADol  Micro Results Recent Results (from the past 240 hour(s))  Urine culture     Status: None   Collection Time: 05/11/15 11:13 AM  Result Value Ref Range Status   Colony Count >=100,000 COLONIES/ML  Final   Organism ID, Bacteria Multiple bacterial morphotypes present, none  Final   Organism ID, Bacteria predominant. Suggest appropriate recollection if   Final   Organism ID, Bacteria clinically indicated.  Final  Culture, blood (Routine x 2)     Status: None (Preliminary result)   Collection Time: 05/13/15  5:20 PM  Result Value Ref Range Status   Specimen Description BLOOD RIGHT UPPER ARM  Final   Special Requests BOTTLES DRAWN AEROBIC AND ANAEROBIC 7.5CC  Final   Culture NO GROWTH 1 DAY  Final   Report Status PENDING  Incomplete  Culture, blood (Routine x 2)     Status: None (Preliminary result)   Collection Time: 05/13/15  5:30 PM  Result Value Ref Range Status    Specimen Description BLOOD LEFT WRIST  Final   Special Requests BOTTLES DRAWN AEROBIC AND ANAEROBIC 5CC  Final   Culture  Setup Time   Final    GRAM POSITIVE COCCI IN CLUSTERS IN BOTH AEROBIC AND ANAEROBIC BOTTLES CRITICAL RESULT CALLED TO, READ BACK BY AND VERIFIED WITH: G. NRU,RN AT [id number] ON [id number] BY S. YARBROUGH    Culture NO GROWTH 1 DAY  Final   Report Status PENDING  Incomplete    Radiology Reports Dg Chest 2 View  05/11/2015  CLINICAL DATA:  Chest pain. EXAM: CHEST  2 VIEW COMPARISON:  No prior. FINDINGS: Cardiomegaly. Normal pulmonary vascularity. Low lung volumes with bibasilar atelectasis and/or infiltrates. Small left pleural effusion. No pneumothorax . IMPRESSION: 1.  Cardiomegaly. 2. Low lung volumes with basilar atelectasis and/or infiltrates. Small left pleural effusion. Electronically Signed   By: Marcello Moores  Register   On: 05/11/2015 11:40   Ct Hip Left W Contrast  05/13/2015  CLINICAL DATA:  Acute onset of erythema, swelling and pain at the left upper hip. Initial encounter. EXAM: CT OF THE LEFT HIP WITH CONTRAST TECHNIQUE: Multidetector CT  imaging was performed following the standard protocol during bolus administration of intravenous contrast. CONTRAST:  1 ISOVUE-300 IOPAMIDOL (ISOVUE-300) INJECTION 61% COMPARISON:  CTA of the abdomen and pelvis performed 06/06/2012 FINDINGS: There is no evidence of fracture or dislocation. The proximal left femur appears intact. The visualized portions of the sacroiliac joint are unremarkable. Diffuse skin thickening is noted along the anterolateral left thigh, with underlying soft tissue edema extending to the level of the fascia. The underlying musculature appears grossly intact. 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 Mild surrounding soft tissue inflammation is seen. There is no definite evidence of abscess  formation. IMPRESSION: 1. No evidence of fracture or dislocation. 2. No evidence of abscess. 3. Diffuse skin thickening along the anterolateral left thigh, with underlying soft tissue edema extending to the level of the fascia. 4. 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 Mild surrounding soft tissue inflammation noted. Electronically Signed   By: Garald Balding M.D.   On: 05/13/2015 20:07    Time Spent in minutes  25   Lala Lund K M.D on 05/15/2015 at 10:34 AM  Between 7am to 7pm - Pager - [phone]  After 7pm go to [email or web address] - password [facility]  [facility] -  Office  [phone]

## 2015-05-15 NOTE — Progress Notes (Signed)
VASCULAR LAB PRELIMINARY  PRELIMINARY  PRELIMINARY  PRELIMINARY  Left lower extremity venous duplex completed.    Preliminary report:  There is no obvious evidence of DVT or SVT noted in the visualized veins of the left lower extremity.   Leopold Smyers, RVT 05/15/2015, 10:12 AM

## 2015-05-16 DIAGNOSIS — A4151 Sepsis due to Escherichia coli [E. coli]: Secondary | ICD-10-CM

## 2015-05-16 LAB — URINE CULTURE

## 2015-05-16 LAB — CULTURE, BLOOD (ROUTINE X 2)

## 2015-05-16 LAB — PROTIME-INR
INR: 3.78 — AB (ref 0.00–1.49)
PROTHROMBIN TIME: 36.4 s — AB (ref 11.6–15.2)

## 2015-05-16 MED ORDER — SULFAMETHOXAZOLE-TRIMETHOPRIM 800-160 MG PO TABS: 1.0000 | ORAL_TABLET | Freq: Two times a day (BID) | ORAL | Status: DC

## 2015-05-16 MED ORDER — NITROFURANTOIN MACROCRYSTAL 50 MG PO CAPS: 50.0000 mg | ORAL_CAPSULE | Freq: Every day | ORAL | Status: DC

## 2015-05-16 MED ORDER — CEFPODOXIME PROXETIL 200 MG PO TABS: 200.0000 mg | ORAL_TABLET | Freq: Two times a day (BID) | ORAL | Status: DC

## 2015-05-16 MED ORDER — FLUCONAZOLE IN SODIUM CHLORIDE 100-0.9 MG/50ML-% IV SOLN
100.0000 mg | Freq: Every day | INTRAVENOUS | Status: DC
  Filled 2015-05-16: qty 50

## 2015-05-16 NOTE — Discharge Summary (Signed)
[name], is a 35 y.o. male  DOB 17-Mar-1980  MRN [id number].  Admission date:  05/13/2015  Admitting Physician  Edwin Dada, MD  Discharge Date:  05/16/2015   Primary MD  Benito Mccreedy, MD  Recommendations for primary care physician for things to follow:   CBC, INR, BMP, 2 view chest x-ray, left hip cellulitis in 3-5 days.   Admission Diagnosis  Cellulitis [L03.90]   Discharge Diagnosis  Cellulitis [L03.90]  , Escherichia coli UTI with sepsis  Principal Problem:   Sepsis ([place]) Active Problems:   Infantile cerebral palsy ([facility])   Hypertension   Cellulitis of left leg   History of DVT (deep vein thrombosis)   Neurogenic bladder      Past Medical History  Diagnosis Date  . Cerebral palsy ([place])   . Recurrent UTI   . Coagulopathy ([place])   . Insomnia   . Tachycardia   . Chronic constipation   . Hypertension     Past Surgical History  Procedure Laterality Date  . Small intestine surgery    . Incise and drain abcess  09/11/10  . Elbow surgery  09/12/10    left  . Tooth extraction  11/22/2011    Procedure: DENTAL RESTORATION/EXTRACTIONS;  Surgeon: Marcelo Baldy, MD;  Location: [facility];  Service: Dentistry;  Laterality: N/A;  dental restoration and cleaning; NO EXTRACTIONS       HPI  from the history and physical done on the day of admission:    [name] is a 35 y.o. male with a past medical history significant for spastic CP, HTN, neurogenic bladder and history of DVT on warfarin who presents with fever and hip swelling.  All history is collected from the patient, who is a reliable historian, and from the chart. First developed HA about 3 days ago. Saw his PCP, had fever to 102F, difficult to localize, and so Augmentin was started for what sounded like pneumonia. Flu  negative at that time, urine grew multiple species. WBC 10.2K.  Took Augmentin, but no real change in status and next day saw area of redness and warmth on LEFT hip. Went back to PCP, where HR 120s, and was sent to ER.  In the ED, he had low grade temp, tachycardia 132, RR 33, BP normal. Na 140, K 3.8, Cr normal, lactate 4.23, WBC 6.9K. CT of the hip showed only cellulitis without fluid collection. Blood and urine cultures were obtained, fluids and IV antibiotics were administered, and TRH were asked to evaluate for admission. Lactate resolved with fluids.  The patient had a spontaneous abscess of the left elbow 5 years ago requiring surgical I&D, causing MSSA bacteremia, which was treated with 4 weeks abx and resolved without incident. No other infections.     Hospital Course:     1. Sepsis due to Left buttock cellulitis and UTI. Patient appears nontoxic, Sepsis physiology has resolved, After hydration IV fluids and empiric IV antibiotics, blood cultures suggestive of contamination one out of 2 coag-negative staph, urine cultures positive sensitivities  noted, he will be placed on oral Bactrim for another 5 days for his cellulitis and Vantin for 6 days for his UTI. CT scan of the left hip noted without any abscess or joint involvement. Also underwent left lower extremity venous ultrasound which was unremarkable.  2. Cerebral palsy with neurogenic bladder. Bladder ultrasound stable, no retention, continue condom catheter and supportive care. UTI as #1 above. Continue supportive care with muscle relaxants for chronic spasticity and contract chest. He lives at home with a caregiver.  3. History of DVT. On Coumadin INR therapeutic pharmacy monitoring. Left Lower extremity venous duplex unremarkable. Request PCP to recheck INR in 3 days.  4. History of spontaneous left elbow abscess 5 years ago. Stable.  5. Essential hypertension. Continue beta blocker. HCTZ on hold.       Follow  UP  Follow-up Information    Follow up with OSEI-BONSU,GEORGE, MD. Schedule an appointment as soon as possible for a visit in 3 days.   Specialty:  Internal Medicine   Contact information:   [id number] [place]  [address] [phone]        Consults obtained - None  Discharge Condition: Stable  Diet and Activity recommendation: See Discharge Instructions below  Discharge Instructions           Discharge Instructions    Diet - low sodium heart healthy    Complete by:  As directed      Discharge instructions    Complete by:  As directed   Follow with Primary MD OSEI-BONSU,GEORGE, MD in 3 days   Get CBC, CMP, INR, 2 view Chest X ray checked  by Primary MD next visit.    Activity: As tolerated with Full fall precautions use walker/cane & assistance as needed   Disposition Home     Diet:   Heart Healthy  with feeding assistance and aspiration precautions.  For Heart failure patients - Check your Weight same time everyday, if you gain over 2 pounds, or you develop in leg swelling, experience more shortness of breath or chest pain, call your Primary MD immediately. Follow Cardiac Low Salt Diet and 1.5 lit/day fluid restriction.   On your next visit with your primary care physician please Get Medicines reviewed and adjusted.   Please request your Prim.MD to go over all Hospital Tests and Procedure/Radiological results at the follow up, please get all Hospital records sent to your Prim MD by signing hospital release before you go home.   If you experience worsening of your admission symptoms, develop shortness of breath, life threatening emergency, suicidal or homicidal thoughts you must seek medical attention immediately by calling 911 or calling your MD immediately  if symptoms less severe.  You Must read complete instructions/literature along with all the possible adverse reactions/side effects for all the Medicines you take and that have been  prescribed to you. Take any new Medicines after you have completely understood and accpet all the possible adverse reactions/side effects.   Do not drive, operating heavy machinery, perform activities at heights, swimming or participation in water activities or provide baby sitting services if your were admitted for syncope or siezures until you have seen by Primary MD or a Neurologist and advised to do so again.  Do not drive when taking Pain medications.    Do not take more than prescribed Pain, Sleep and Anxiety Medications  Special Instructions: If you have smoked or chewed Tobacco  in the last 2 yrs please stop smoking, stop  any regular Alcohol  and or any Recreational drug use.  Wear Seat belts while driving.   Please note  You were cared for by a hospitalist during your hospital stay. If you have any questions about your discharge medications or the care you received while you were in the hospital after you are discharged, you can call the unit and asked to speak with the hospitalist on call if the hospitalist that took care of you is not available. Once you are discharged, your primary care physician will handle any further medical issues. Please note that NO REFILLS for any discharge medications will be authorized once you are discharged, as it is imperative that you return to your primary care physician (or establish a relationship with a primary care physician if you do not have one) for your aftercare needs so that they can reassess your need for medications and monitor your lab values.     Increase activity slowly    Complete by:  As directed              Discharge Medications       Medication List    TAKE these medications        acetaminophen 325 MG tablet  Commonly known as:  TYLENOL  Take 650 mg by mouth every 6 (six) hours as needed. For pain     baclofen 20 MG tablet  Commonly known as:  LIORESAL  Take 1 tablet (20 mg total) by mouth 4 (four) times daily.      calcium carbonate 500 MG chewable tablet  Commonly known as:  TUMS - dosed in mg elemental calcium  Chew 1 tablet by mouth 3 (three) times daily.     cefpodoxime 200 MG tablet  Commonly known as:  VANTIN  Take 1 tablet (200 mg total) by mouth 2 (two) times daily.     cetirizine 10 MG tablet  Commonly known as:  ZYRTEC  Take 10 mg by mouth daily.     cholecalciferol 1000 units tablet  Commonly known as:  VITAMIN D  Take 1,000 Units by mouth 2 (two) times daily.     cyclobenzaprine 10 MG tablet  Commonly known as:  FLEXERIL  1 at 8am and 12pm and 2 tabs at 8pm.     diazepam 2 MG tablet  Commonly known as:  VALIUM  Take 1 tablet (2 mg total) by mouth 2 (two) times daily.     glycerin adult 2 g Supp  Generic drug:  glycerin adult  Place 1 suppository rectally every other day.     hydrochlorothiazide 25 MG tablet  Commonly known as:  HYDRODIURIL  Take 25 mg by mouth daily.     loperamide 2 MG capsule  Commonly known as:  IMODIUM  Take 2 mg by mouth 3 (three) times daily as needed for diarrhea or loose stools. Reported on 05/11/2015     metoprolol succinate 100 MG 24 hr tablet  Commonly known as:  TOPROL-XL  Take 100 mg by mouth daily.     multivitamin with minerals Tabs tablet  Take 1 tablet by mouth daily.     nitrofurantoin 50 MG capsule  Commonly known as:  MACRODANTIN  Take 1 capsule (50 mg total) by mouth daily.  Start taking on:  05/23/2015     senna-docusate 8.6-50 MG tablet  Commonly known as:  Senokot-S  Take 3 tablets by mouth at bedtime.     sodium phosphate enema  Commonly known as:  FLEET  Place 1 enema rectally See admin instructions. Reported on 05/11/2015     sulfamethoxazole-trimethoprim 800-160 MG tablet  Commonly known as:  BACTRIM DS,SEPTRA DS  Take 1 tablet by mouth every 12 (twelve) hours.     tizanidine 2 MG capsule  Commonly known as:  ZANAFLEX  Take 1 capsule (2 mg total) by mouth 3 (three) times daily.     traMADol 50 MG tablet   Commonly known as:  ULTRAM  Take 50 mg by mouth every 6 (six) hours as needed for moderate pain.     warfarin 1 MG tablet  Commonly known as:  COUMADIN  Take 0.5 mg by mouth as directed. Take along with 3mg  tablet on  Tuesday, Thursday, sat, and sun to make a total of 3.5     warfarin 3 MG tablet  Commonly known as:  COUMADIN  Take 3 mg by mouth daily. Take 1 tablet every day but on Tuesday, Thursday, Saturday, & Sunday take along with 0.5mg  tablet        Major procedures and Radiology Reports - PLEASE review detailed and final reports for all details, in brief -   CT L Hip - 1. No evidence of fracture or dislocation. 2. No evidence of abscess. 3. Diffuse skin thickening along the anterolateral left thigh, with underlying soft tissue edema extending to the level of the fascia. 4. 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 Mild surrounding soft tissue inflammation noted.  Left lower extremity venous duplex. Preliminary report:There is no obvious evidence of DVT or SVT noted in the visualized veins.    Dg Chest 2 View  05/11/2015  CLINICAL DATA:  Chest pain. EXAM: CHEST  2 VIEW COMPARISON:  No prior. FINDINGS: Cardiomegaly. Normal pulmonary vascularity. Low lung volumes with bibasilar atelectasis and/or infiltrates. Small left pleural effusion. No pneumothorax . IMPRESSION: 1.  Cardiomegaly. 2. Low lung volumes with basilar atelectasis and/or infiltrates. Small left pleural effusion. Electronically Signed   By: Marcello Moores  Register   On: 05/11/2015 11:40   Ct Hip Left W Contrast  05/13/2015  CLINICAL DATA:  Acute onset of erythema, swelling and pain at the left upper hip. Initial encounter. EXAM: CT OF THE LEFT HIP WITH CONTRAST TECHNIQUE: Multidetector CT imaging was performed following the standard protocol during bolus administration of intravenous contrast. CONTRAST:   1 ISOVUE-300 IOPAMIDOL (ISOVUE-300) INJECTION 61% COMPARISON:  CTA of the abdomen and pelvis performed 06/06/2012 FINDINGS: There is no evidence of fracture or dislocation. The proximal left femur appears intact. The visualized portions of the sacroiliac joint are unremarkable. Diffuse skin thickening is noted along the anterolateral left thigh, with underlying soft tissue edema extending to the level of the fascia. The underlying musculature appears grossly intact. 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 Mild surrounding soft tissue inflammation is seen. There is no definite evidence of abscess formation. IMPRESSION: 1. No evidence of fracture or dislocation. 2. No evidence of abscess. 3. Diffuse skin thickening along the anterolateral left thigh, with underlying soft tissue edema extending to the level of the fascia. 4. 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 Mild surrounding soft tissue inflammation noted. Electronically Signed   By: Garald Balding  M.D.   On: 05/13/2015 20:07    Micro Results      Recent Results (from the past 240 hour(s))  Urine culture     Status: None   Collection Time: 05/11/15 11:13 AM  Result Value Ref Range Status   Colony Count >=100,000 COLONIES/ML  Final   Organism ID, Bacteria Multiple bacterial morphotypes present, none  Final   Organism ID, Bacteria predominant. Suggest appropriate recollection if   Final   Organism ID, Bacteria clinically indicated.  Final  Culture, blood (Routine x 2)     Status: None (Preliminary result)   Collection Time: 05/13/15  5:20 PM  Result Value Ref Range Status   Specimen Description BLOOD RIGHT UPPER ARM  Final   Special Requests BOTTLES DRAWN AEROBIC AND ANAEROBIC 7.5CC   Final   Culture NO GROWTH 2 DAYS  Final   Report Status PENDING  Incomplete  Culture, blood (Routine x 2)     Status: Abnormal (Preliminary result)   Collection Time: 05/13/15  5:30 PM  Result Value Ref Range Status   Specimen Description BLOOD LEFT WRIST  Final   Special Requests BOTTLES DRAWN AEROBIC AND ANAEROBIC 5CC  Final   Culture  Setup Time   Final    GRAM POSITIVE COCCI IN CLUSTERS IN BOTH AEROBIC AND ANAEROBIC BOTTLES CRITICAL RESULT CALLED TO, READ BACK BY AND VERIFIED WITH: G. NRU,RN AT [id number] ON [id number] BY S. YARBROUGH    Culture (A)  Final    STAPHYLOCOCCUS SPECIES (COAGULASE NEGATIVE) THE SIGNIFICANCE OF ISOLATING THIS ORGANISM FROM A SINGLE SET OF BLOOD CULTURES WHEN MULTIPLE SETS ARE DRAWN IS UNCERTAIN. PLEASE NOTIFY THE MICROBIOLOGY DEPARTMENT WITHIN ONE WEEK IF SPECIATION AND SENSITIVITIES ARE REQUIRED.    Report Status PENDING  Incomplete  Urine culture     Status: Abnormal   Collection Time: 05/13/15 10:48 PM  Result Value Ref Range Status   Specimen Description URINE, CLEAN CATCH  Final   Special Requests NONE  Final   Culture (A)  Final    >=100,000 COLONIES/mL PSEUDOMONAS AERUGINOSA 50,000 COLONIES/mL YEAST    Report Status 05/16/2015 FINAL  Final   Organism ID, Bacteria PSEUDOMONAS AERUGINOSA (A)  Final      Susceptibility   Pseudomonas aeruginosa - MIC*    CEFTAZIDIME 4 SENSITIVE Sensitive     CIPROFLOXACIN <=0.25 SENSITIVE Sensitive     GENTAMICIN 2 SENSITIVE Sensitive     IMIPENEM 1 SENSITIVE Sensitive     PIP/TAZO 8 SENSITIVE Sensitive     CEFEPIME 2 SENSITIVE Sensitive     * >=100,000 COLONIES/mL PSEUDOMONAS AERUGINOSA       Today   Subjective    [name] today has no headache,no chest abdominal pain,no new weakness tingling or numbness, feels much better wants to go home today.     Objective   Blood pressure 130/68, pulse 100, temperature 98 F (36.7 C), temperature source Axillary, resp. rate 20, SpO2 100 %.   Intake/Output  Summary (Last 24 hours) at 05/16/15 1044 Last data filed at 05/16/15 0500  Gross per 24 hour  Intake    190 ml  Output   1250 ml  Net  -1060 ml    Exam  Awake Alert, No new F.N deficits, Chronic contractures, Normal affect [place].AT,PERRAL Supple Neck,No JVD, No cervical lymphadenopathy appriciated.  Symmetrical Chest wall movement, Good air movement bilaterally, CTAB RRR,No Gallops,Rubs or new Murmurs, No Parasternal Heave +ve B.Sounds, Abd Soft, Non tender, No organomegaly appriciated, No rebound -guarding or rigidity. No Cyanosis,  Clubbing or edema, No new Rash or bruise on left hip cellulitis almost completely resolved.   Data Review   CBC w Diff:  Lab Results  Component Value Date   WBC 5.2 05/14/2015   WBC 10.2 05/11/2015   HGB 11.8* 05/14/2015   HGB 12.8* 05/11/2015   HCT 34.4* 05/14/2015   HCT 38.3* 05/11/2015   PLT 140* 05/14/2015   LYMPHOPCT 28 05/13/2015   MONOPCT 13 05/13/2015   EOSPCT 2 05/13/2015   BASOPCT 0 05/13/2015    CMP:  Lab Results  Component Value Date   NA 139 05/14/2015   K 3.9 05/14/2015   CL 104 05/14/2015   CO2 26 05/14/2015   BUN 10 05/14/2015   CREATININE 0.86 05/14/2015   PROT 6.8 05/13/2015   ALBUMIN 3.4* 05/13/2015   BILITOT 1.1 05/13/2015   ALKPHOS 55 05/13/2015   AST 31 05/13/2015   ALT 27 05/13/2015  .   Total Time in preparing paper work, data evaluation and todays exam - 35 minutes  Thurnell Lose M.D on 05/16/2015 at 10:44 AM  [facility]   Office  [phone]

## 2015-05-16 NOTE — Discharge Instructions (Signed)
Follow with Primary MD OSEI-BONSU,GEORGE, MD in 3 days   Get CBC, CMP, INR, 2 view Chest X ray checked  by Primary MD next visit.    Activity: As tolerated with Full fall precautions use walker/cane & assistance as needed   Disposition Home     Diet:   Heart Healthy  with feeding assistance and aspiration precautions.  For Heart failure patients - Check your Weight same time everyday, if you gain over 2 pounds, or you develop in leg swelling, experience more shortness of breath or chest pain, call your Primary MD immediately. Follow Cardiac Low Salt Diet and 1.5 lit/day fluid restriction.   On your next visit with your primary care physician please Get Medicines reviewed and adjusted.   Please request your Prim.MD to go over all Hospital Tests and Procedure/Radiological results at the follow up, please get all Hospital records sent to your Prim MD by signing hospital release before you go home.   If you experience worsening of your admission symptoms, develop shortness of breath, life threatening emergency, suicidal or homicidal thoughts you must seek medical attention immediately by calling 911 or calling your MD immediately  if symptoms less severe.  You Must read complete instructions/literature along with all the possible adverse reactions/side effects for all the Medicines you take and that have been prescribed to you. Take any new Medicines after you have completely understood and accpet all the possible adverse reactions/side effects.   Do not drive, operating heavy machinery, perform activities at heights, swimming or participation in water activities or provide baby sitting services if your were admitted for syncope or siezures until you have seen by Primary MD or a Neurologist and advised to do so again.  Do not drive when taking Pain medications.    Do not take more than prescribed Pain, Sleep and Anxiety Medications  Special Instructions: If you have smoked or chewed  Tobacco  in the last 2 yrs please stop smoking, stop any regular Alcohol  and or any Recreational drug use.  Wear Seat belts while driving.   Please note  You were cared for by a hospitalist during your hospital stay. If you have any questions about your discharge medications or the care you received while you were in the hospital after you are discharged, you can call the unit and asked to speak with the hospitalist on call if the hospitalist that took care of you is not available. Once you are discharged, your primary care physician will handle any further medical issues. Please note that NO REFILLS for any discharge medications will be authorized once you are discharged, as it is imperative that you return to your primary care physician (or establish a relationship with a primary care physician if you do not have one) for your aftercare needs so that they can reassess your need for medications and monitor your lab values.

## 2015-05-16 NOTE — Care Management (Addendum)
Case manager contacted [name] provider for patient to inform him that patient has been discharged and he can arrange for transportation home. [name] states someone from [employer] will pick patient up. Discharge information will be sent with driver.

## 2015-05-16 NOTE — Progress Notes (Signed)
Pt discharged

## 2015-05-16 NOTE — Care Management Important Message (Signed)
Important Message  Patient Details  Name: [name] MRN: [id number] Date of Birth: 1980/10/20   Medicare Important Message Given:  Yes    [name] [name] 05/16/2015, 12:20 PM

## 2015-05-18 LAB — CULTURE, BLOOD (ROUTINE X 2): Culture: NO GROWTH

## 2015-07-05 ENCOUNTER — Encounter: Payer: Self-pay | Admitting: Nurse Practitioner

## 2015-07-05 ENCOUNTER — Ambulatory Visit (INDEPENDENT_AMBULATORY_CARE_PROVIDER_SITE_OTHER): Payer: Medicare Other | Admitting: Nurse Practitioner

## 2015-07-05 VITALS — BP 113/80 | HR 93 | Ht 70.0 in

## 2015-07-05 DIAGNOSIS — R258 Other abnormal involuntary movements: Secondary | ICD-10-CM

## 2015-07-05 DIAGNOSIS — R252 Cramp and spasm: Secondary | ICD-10-CM

## 2015-07-05 DIAGNOSIS — G809 Cerebral palsy, unspecified: Secondary | ICD-10-CM

## 2015-07-05 MED ORDER — CYCLOBENZAPRINE HCL 10 MG PO TABS: ORAL_TABLET | ORAL | Status: DC

## 2015-07-05 MED ORDER — TIZANIDINE HCL 2 MG PO CAPS: 2.0000 mg | ORAL_CAPSULE | Freq: Three times a day (TID) | ORAL | Status: DC

## 2015-07-05 MED ORDER — DIAZEPAM 2 MG PO TABS: 2.0000 mg | ORAL_TABLET | Freq: Two times a day (BID) | ORAL | Status: DC

## 2015-07-05 MED ORDER — BACLOFEN 20 MG PO TABS: 20.0000 mg | ORAL_TABLET | Freq: Four times a day (QID) | ORAL | Status: DC

## 2015-07-05 NOTE — Progress Notes (Signed)
[facility]  PATIENT: [name] DOB: 1980-06-21   REASON FOR VISIT: Follow-up for cerebral palsy and spasticity HISTORY FROM: Patient and caregiver    HISTORY OF PRESENT ILLNESS:HISTORY [name] is a 35 years old right-handed male, accompanied by his caregiver [name] for evaluation of spasticity. He was born with cerebral palsy, has been wheelchair-bound, now lives alone, with his caregiver lives in with him, his father, stepmother, grandmother, is actively involved in his care. 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 He suffered bilateral lower extremity DVT in 2000, and 2005,on chronic Coumadin treatment, Over the years, his spasticity has been controlled by medications, baclofen 20 mg 4 times a day, Valium 2 mg twice a day, Flexeril 10 mg 3 times a day, but a spasticity has gradually worsened over time, sometimes it was so forceful, he ripped the arm rest  UPDATE May 19 2014:He is with his caregiver daily, he still works full time, he likes current medication changes, baclofen 20 mg 4 times a day, Valium 2 mg twice a day, Flexeril 10 mg 3 times a day, I have added on tizanidine 2 mg 3 times a day, works well,  UPDATE 11/8/16CM [name] returns with his caregiver. He is having more spasticity first thing in the morning and before bed. He is currently on baclofen 20 mg, 4 times a day, Flexeril 10 mg 3 times a day, Valium 2 mg twice daily and tizanidine 2 mg 3 times daily. He returns for reevaluation UPDATE 07/05/2015 CMMr.  23, 35 year old male returns for follow-up with his caregiver. He lives in an alternative family situation. His spasticity is currently well-controlled, he has a history of cerebral palsy and is wheelchair bound. He does have some control of his right upper extremities minimal of his lower extremities. His spasticity is controlled by 4 medications. He needs refills. He returns for reevaluation  REVIEW OF SYSTEMS: Full 14 system review of systems performed and notable only for those listed, all others are neg:  Constitutional: neg  Cardiovascular: neg Ear/Nose/Throat: neg  Skin: neg Eyes: neg Respiratory: neg Gastroitestinal: neg  Hematology/Lymphatic: neg  Endocrine: neg Musculoskeletal:neg Allergy/Immunology: neg Neurological: Speech difficulty Psychiatric: neg Sleep : neg   ALLERGIES: Allergies  Allergen Reactions  . Betadine [Povidone Iodine] Other (See Comments)    Unknown reaction per caregiver  . Ciprofloxacin Other (See Comments)    Unknown reaction per caregiver  . Morphine Other (See Comments)    Unknown reaction per caregiver    HOME MEDICATIONS: Outpatient Prescriptions Prior to Visit  Medication Sig Dispense Refill  . acetaminophen (TYLENOL) 325 MG tablet Take 650 mg by mouth every 6 (six) hours as needed. For pain    . baclofen (LIORESAL) 20 MG tablet Take 1 tablet (20 mg total) by mouth 4 (four) times daily. 360 each 3  . calcium carbonate (TUMS - DOSED IN MG ELEMENTAL CALCIUM) 500 MG chewable tablet Chew 1 tablet by mouth 3 (three) times daily.    . cetirizine (ZYRTEC) 10 MG tablet Take 10 mg by mouth daily.    . cholecalciferol (VITAMIN D) 1000 UNITS tablet Take  1,000 Units by mouth 2 (two) times daily.    . cyclobenzaprine (FLEXERIL) 10 MG tablet 1 at 8am and 12pm and 2 tabs at 8pm. (Patient taking differently: Take 10-20 mg by mouth 3 (three) times daily. 1 at 8am and 12pm and 2 tabs at 8pm.) 360 tablet 1  . diazepam (VALIUM) 2 MG tablet Take 1 tablet (2 mg  total) by mouth 2 (two) times daily. 60 tablet 5  . glycerin adult (GLYCERIN ADULT) 2 G SUPP Place 1 suppository rectally every other day.    . hydrochlorothiazide (HYDRODIURIL) 25 MG tablet Take 25 mg by mouth daily.    Marland Kitchen loperamide (IMODIUM) 2 MG capsule Take 2 mg by mouth 3 (three) times daily as needed for diarrhea or loose stools. Reported on 05/11/2015    . metoprolol (TOPROL-XL) 100 MG 24 hr tablet Take 100 mg by mouth daily.     . Multiple Vitamin (MULITIVITAMIN WITH MINERALS) TABS Take 1 tablet by mouth daily.    . nitrofurantoin (MACRODANTIN) 50 MG capsule Take 1 capsule (50 mg total) by mouth daily.    Marland Kitchen senna-docusate (SENOKOT-S) 8.6-50 MG tablet Take 3 tablets by mouth at bedtime.    . sodium phosphate (FLEET) enema Place 1 enema rectally See admin instructions. Reported on 05/11/2015    . tizanidine (ZANAFLEX) 2 MG capsule Take 1 capsule (2 mg total) by mouth 3 (three) times daily. 270 capsule 3  . traMADol (ULTRAM) 50 MG tablet Take 50 mg by mouth every 6 (six) hours as needed for moderate pain.     Marland Kitchen warfarin (COUMADIN) 1 MG tablet Take 0.5 mg by mouth as directed. Take along with 3mg  tablet on  Tuesday, Thursday, sat, and sun to make a total of 3.5    . warfarin (COUMADIN) 3 MG tablet Take 3 mg by mouth daily. Take 1 tablet every day but on Tuesday, Thursday, Saturday, & Sunday take along with 0.5mg  tablet    . cefpodoxime (VANTIN) 200 MG tablet Take 1 tablet (200 mg total) by mouth 2 (two) times daily. 12 tablet 0  . sulfamethoxazole-trimethoprim (BACTRIM DS,SEPTRA DS) 800-160 MG tablet Take 1 tablet by mouth every 12 (twelve) hours. 10 tablet 0   No facility-administered medications prior to visit.    PAST MEDICAL HISTORY: Past Medical History  Diagnosis Date  . Cerebral palsy ([place])   . Recurrent UTI   . Coagulopathy ([place])   . Insomnia   . Tachycardia   . Chronic constipation   . Hypertension     PAST SURGICAL HISTORY: Past Surgical History  Procedure Laterality Date   . Small intestine surgery    . Incise and drain abcess  09/11/10  . Elbow surgery  09/12/10    left  . Tooth extraction  11/22/2011    Procedure: DENTAL RESTORATION/EXTRACTIONS;  Surgeon: Marcelo Baldy, MD;  Location: [place];  Service: Dentistry;  Laterality: N/A;  dental restoration and cleaning; NO EXTRACTIONS    FAMILY HISTORY: Family History  Problem Relation Age of Onset  . Family history unknown: Yes    SOCIAL HISTORY: Social History   Social History  . Marital Status: Single    Spouse Name: N/A  . Number of Children: N/A  . Years of Education: N/A   Occupational History  . Not on file.   Social History Main Topics  . Smoking status: Never Smoker   . Smokeless tobacco: Never Used  . Alcohol Use: No  . Drug Use: No  . Sexual Activity: No  Other Topics Concern  . Not on file   Social History Narrative     PHYSICAL EXAM  Filed Vitals:   07/05/15 1448  BP: 113/80  Pulse: 93  Height: 5\' 10"  (1.778 m)   There is no weight on file to calculate BMI. Generalized: In no acute distress Neck: Supple, no carotid bruits  Cardiac: Regular rate rhythm Pulmonary: Clear to auscultation bilaterally  Neurological examination Mentation: he sits in wheelchair, slow spastic dysarthria, Cranial nerve II-XII: Pupils were equal round reactive to light. Extraocular movements were full. Visual field were full on confrontational test.Facial sensation and strength were normal. Hearing was intact to finger rubbing bilaterally. Uvula tongue midline. He has slow spastic tongue movement, Motor: Profound spasticity of bilateral upper and lower extremities,right arm has relatively free movement,tends to stay in right shoulder extension, elbow extension, wrist finger flexion, more profound left upper extremity spasticity, with left shoulder extension, elbow flexion, wrist extension, finger flexion, Trace movement of left foot, bilateral knee contraction. Limbs were hold in place by  straps. Sensory: Intact to fine touch, pinprick,Vibratory Gait: deferred Deep tendon reflexes: Hypoactive and symmetric  DIAGNOSTIC DATA (LABS, IMAGING, TESTING) - I reviewed patient records, labs, notes, testing and imaging myself where available.  Lab Results  Component Value Date   WBC 5.2 05/14/2015   HGB 11.8* 05/14/2015   HCT 34.4* 05/14/2015   MCV 89.4 05/14/2015   PLT 140* 05/14/2015      Component Value Date/Time   NA 139 05/14/2015 0330   K 3.9 05/14/2015 0330   CL 104 05/14/2015 0330   CO2 26 05/14/2015 0330   GLUCOSE 88 05/14/2015 0330   BUN 10 05/14/2015 0330   CREATININE 0.86 05/14/2015 0330   CALCIUM 7.7* 05/14/2015 0330   PROT 6.8 05/13/2015 1500   ALBUMIN 3.4* 05/13/2015 1500   AST 31 05/13/2015 1500   ALT 27 05/13/2015 1500   ALKPHOS 55 05/13/2015 1500   BILITOT 1.1 05/13/2015 1500   GFRNONAA >60 05/14/2015 0330   GFRAA >60 05/14/2015 0330    ASSESSMENT AND PLAN 35 y.o. year old male has a past medical history of Cerebral palsy ([place]); ; Coagulopathy ([place]); Insomnia; Tachycardia; Chronic constipation; and Hypertension. here to follow-up  PLAN: Continue baclofen at current dose will refills  Flexeril to 1 tablet 8 a.m. 1 tablet around noon and 2 tabs at bedtime will renew prescription Continue Valium 2 mg twice daily will renew Continue Zanaflex 2 mg 3 times daily will renew Follow-up in 6-8 months next with Dr. Luan Pulling, [facility], [facility], [facility] [address] [place], [place] [address] [phone]

## 2015-07-05 NOTE — Patient Instructions (Signed)
Continue baclofen at current dose will refills  Flexeril to 1 tablet 8 a.m. 1 tablet around noon and 2 tabs at bedtime will renew prescription Continue Valium 2 mg twice daily will renew Continue Zanaflex 2 mg 3 times daily will renew Follow-up in 6-8 months next with Dr. Krista Blue

## 2015-07-06 ENCOUNTER — Telehealth: Payer: Self-pay | Admitting: *Deleted

## 2015-07-06 NOTE — Telephone Encounter (Signed)
Received fax confirmation from [employer].

## 2015-07-17 ENCOUNTER — Other Ambulatory Visit: Payer: Self-pay | Admitting: Neurology

## 2015-07-18 NOTE — Progress Notes (Signed)
I have reviewed and agreed above plan. 

## 2015-07-26 ENCOUNTER — Ambulatory Visit (INDEPENDENT_AMBULATORY_CARE_PROVIDER_SITE_OTHER): Payer: Medicare Other | Admitting: Emergency Medicine

## 2015-07-26 VITALS — BP 122/80 | HR 107 | Temp 97.3°F | Resp 18 | Ht 70.0 in

## 2015-07-26 DIAGNOSIS — J029 Acute pharyngitis, unspecified: Secondary | ICD-10-CM

## 2015-07-26 DIAGNOSIS — Z7901 Long term (current) use of anticoagulants: Secondary | ICD-10-CM | POA: Diagnosis not present

## 2015-07-26 DIAGNOSIS — S70262A Insect bite (nonvenomous), left hip, initial encounter: Secondary | ICD-10-CM

## 2015-07-26 DIAGNOSIS — G809 Cerebral palsy, unspecified: Secondary | ICD-10-CM

## 2015-07-26 DIAGNOSIS — R000 Tachycardia, unspecified: Secondary | ICD-10-CM

## 2015-07-26 DIAGNOSIS — W57XXXA Bitten or stung by nonvenomous insect and other nonvenomous arthropods, initial encounter: Secondary | ICD-10-CM | POA: Diagnosis not present

## 2015-07-26 LAB — POCT CBC
Granulocyte percent: 62.4 %G (ref 37–80)
HCT, POC: 42 % — AB (ref 43.5–53.7)
Hemoglobin: 14.6 g/dL (ref 14.1–18.1)
LYMPH, POC: 1.5 (ref 0.6–3.4)
MCH, POC: 29.9 pg (ref 27–31.2)
MCHC: 34.7 g/dL (ref 31.8–35.4)
MCV: 86.2 fL (ref 80–97)
MID (CBC): 0.4 (ref 0–0.9)
MPV: 6.6 fL (ref 0–99.8)
POC Granulocyte: 3.1 (ref 2–6.9)
POC LYMPH %: 29.4 % (ref 10–50)
POC MID %: 8.2 % (ref 0–12)
Platelet Count, POC: 202 10*3/uL (ref 142–424)
RBC: 4.87 M/uL (ref 4.69–6.13)
RDW, POC: 14.1 %
WBC: 5 10*3/uL (ref 4.6–10.2)

## 2015-07-26 LAB — PROTIME-INR

## 2015-07-26 LAB — BASIC METABOLIC PANEL WITH GFR
BUN: 10 mg/dL (ref 7–25)
CHLORIDE: 98 mmol/L (ref 98–110)
CO2: 20 mmol/L (ref 20–31)
Calcium: 9.1 mg/dL (ref 8.6–10.3)
Creat: 1 mg/dL (ref 0.60–1.35)
GFR, Est Non African American: >89 mL/min (ref >=60)
Glucose, Bld: 113 mg/dL — ABNORMAL HIGH (ref 65–99)
POTASSIUM: 4.1 mmol/L (ref 3.5–5.3)
SODIUM: 138 mmol/L (ref 135–146)

## 2015-07-26 LAB — POCT RAPID STREP A (OFFICE): Rapid Strep A Screen: NEGATIVE

## 2015-07-26 NOTE — Patient Instructions (Addendum)
Please check temperature twice a day. Please check the area around the tick bite or redness area I will call you with strep culture results in 48-72 hours.Tick Bite Information Ticks are insects that attach themselves to the skin and draw blood for food. There are various types of ticks. Common types include wood ticks and deer ticks. Most ticks live in shrubs and grassy areas. Ticks can climb onto your body when you make contact with leaves or grass where the tick is waiting. The most common places on the body for ticks to attach themselves are the scalp, neck, armpits, waist, and groin. Most tick bites are harmless, but sometimes ticks carry germs that cause diseases. These germs can be spread to a person during the tick's feeding process. The chance of a disease spreading through a tick bite depends on:   The type of tick.  Time of year.   How long the tick is attached.   Geographic location.  HOW CAN YOU PREVENT TICK BITES? Take these steps to help prevent tick bites when you are outdoors:  Wear protective clothing. Long sleeves and long pants are best.   Wear white clothes so you can see ticks more easily.  Tuck your pant legs into your socks.   If walking on a trail, stay in the middle of the trail to avoid brushing against bushes.  Avoid walking through areas with long grass.  Put insect repellent on all exposed skin and along boot tops, pant legs, and sleeve cuffs.   Check clothing, hair, and skin repeatedly and before going inside.   Brush off any ticks that are not attached.  Take a shower or bath as soon as possible after being outdoors.  WHAT IS THE PROPER WAY TO REMOVE A TICK? Ticks should be removed as soon as possible to help prevent diseases caused by tick bites. 1. If latex gloves are available, put them on before trying to remove a tick.  2. Using fine-point tweezers, grasp the tick as close to the skin as possible. You may also use curved forceps or a  tick removal tool. Grasp the tick as close to its head as possible. Avoid grasping the tick on its body. 3. Pull gently with steady upward pressure until the tick lets go. Do not twist the tick or jerk it suddenly. This may break off the tick's head or mouth parts. 4. Do not squeeze or crush the tick's body. This could force disease-carrying fluids from the tick into your body.  5. After the tick is removed, wash the bite area and your hands with soap and water or other disinfectant such as alcohol. 6. Apply a small amount of antiseptic cream or ointment to the bite site.  7. Wash and disinfect any instruments that were used.  Do not try to remove a tick by applying a hot match, petroleum jelly, or fingernail polish to the tick. These methods do not work and may increase the chances of disease being spread from the tick bite.  WHEN SHOULD YOU SEEK MEDICAL CARE? Contact your health care provider if you are unable to remove a tick from your skin or if a part of the tick breaks off and is stuck in the skin.  After a tick bite, you need to be aware of signs and symptoms that could be related to diseases spread by ticks. Contact your health care provider if you develop any of the following in the days or weeks after the tick bite:  Unexplained fever.  Rash. A circular rash that appears days or weeks after the tick bite may indicate the possibility of Lyme disease. The rash may resemble a target with a bull's-eye and may occur at a different part of your body than the tick bite.  Redness and swelling in the area of the tick bite.   Tender, swollen lymph glands.   Diarrhea.   Weight loss.   Cough.   Fatigue.   Muscle, joint, or bone pain.   Abdominal pain.   Headache.   Lethargy or a change in your level of consciousness.  Difficulty walking or moving your legs.   Numbness in the legs.   Paralysis.  Shortness of breath.   Confusion.   Repeated vomiting.     This information is not intended to replace advice given to you by your health care provider. Make sure you discuss any questions you have with your health care provider.   Document Released: 01/12/2000 Document Revised: 02/04/2014 Document Reviewed: 06/24/2012 Elsevier Interactive Patient Education 2016 [employer].     IF you received an x-ray today, you will receive an invoice from [facility]. Please contact [facility] at [phone] with questions or concerns regarding your invoice.   IF you received labwork today, you will receive an invoice from [employer]. Please contact [facility] at [phone] with questions or concerns regarding your invoice.   Our billing staff will not be able to assist you with questions regarding bills from these companies.  You will be contacted with the lab results as soon as they are available. The fastest way to get your results is to activate your My Chart account. Instructions are located on the last page of this paperwork. If you have not heard from [place] regarding the results in 2 weeks, please contact this office.

## 2015-07-26 NOTE — Progress Notes (Signed)
By signing my name below, I, Raven Small, attest that this documentation has been prepared under the direction and in the presence of Arlyss Queen, MD.  Electronically Signed: Thea Alken, ED Scribe. 07/26/2015. 1:41 PM.  Chief Complaint:  Chief Complaint  Patient presents with  . Tick Removal  . Sore Throat  . Headache    HPI: [name] is a 35 y.o. male  with hx of infantile cerebral palsy  who reports to [facility] today complaining of a tick bite. Per patients care giver, he removed a tick off of patient 3 days ago from left hip. Pt began complain the following day of sore throat and a HA today. Care giver believes he may of have gotten tick while at camp. Pt denies rash.   Past Medical History  Diagnosis Date  . Cerebral palsy ([place])   . Recurrent UTI   . Coagulopathy ([place])   . Insomnia   . Tachycardia   . Chronic constipation   . Hypertension    Past Surgical History  Procedure Laterality Date  . Small intestine surgery    . Incise and drain abcess  09/11/10  . Elbow surgery  09/12/10    left  . Tooth extraction  11/22/2011    Procedure: DENTAL RESTORATION/EXTRACTIONS;  Surgeon: Marcelo Baldy, MD;  Location: [place];  Service: Dentistry;  Laterality: N/A;  dental restoration and cleaning; NO EXTRACTIONS   Social History   Social History  . Marital Status: Single    Spouse Name: N/A  . Number of Children: N/A  . Years of Education: N/A   Social History Main Topics  . Smoking status: Never Smoker   . Smokeless tobacco: Never Used  . Alcohol Use: No  . Drug Use: No  . Sexual Activity: No   Other Topics Concern  . None   Social History Narrative   Family History  Problem Relation Age of Onset  . Family history unknown: Yes   Allergies  Allergen Reactions  . Betadine [Povidone Iodine] Other (See Comments)    Unknown reaction per caregiver  . Ciprofloxacin Other (See Comments)    Unknown reaction per caregiver  . Morphine Other (See Comments)    Unknown  reaction per caregiver   Prior to Admission medications   Medication Sig Start Date End Date Taking? Authorizing Provider  acetaminophen (TYLENOL) 325 MG tablet Take 650 mg by mouth every 6 (six) hours as needed. For pain   Yes Historical Provider, MD  baclofen (LIORESAL) 20 MG tablet Take 1 tablet (20 mg total) by mouth 4 (four) times daily. 07/05/15  Yes Dennie Bible, NP  calcium carbonate (TUMS - DOSED IN MG ELEMENTAL CALCIUM) 500 MG chewable tablet Chew 1 tablet by mouth 3 (three) times daily.   Yes Historical Provider, MD  cetirizine (ZYRTEC) 10 MG tablet Take 10 mg by mouth daily.   Yes Historical Provider, MD  cholecalciferol (VITAMIN D) 1000 UNITS tablet Take 1,000 Units by mouth 2 (two) times daily.   Yes Historical Provider, MD  cyclobenzaprine (FLEXERIL) 10 MG tablet 1 at 8am and 12pm and 2 tabs at 8pm. 07/05/15  Yes Dennie Bible, NP  diazepam (VALIUM) 2 MG tablet Take 1 tablet (2 mg total) by mouth 2 (two) times daily. 07/05/15  Yes Dennie Bible, NP  glycerin adult (GLYCERIN ADULT) 2 G SUPP Place 1 suppository rectally every other day.   Yes Historical Provider, MD  hydrochlorothiazide (HYDRODIURIL) 25 MG tablet Take 25 mg by mouth  daily.   Yes Historical Provider, MD  loperamide (IMODIUM) 2 MG capsule Take 2 mg by mouth 3 (three) times daily as needed for diarrhea or loose stools. Reported on 05/11/2015   Yes Historical Provider, MD  metoprolol (TOPROL-XL) 100 MG 24 hr tablet Take 100 mg by mouth daily.    Yes Historical Provider, MD  Multiple Vitamin (MULITIVITAMIN WITH MINERALS) TABS Take 1 tablet by mouth daily.   Yes Historical Provider, MD  nitrofurantoin (MACRODANTIN) 50 MG capsule Take 1 capsule (50 mg total) by mouth daily. 05/23/15  Yes Thurnell Lose, MD  senna-docusate (SENOKOT-S) 8.6-50 MG tablet Take 3 tablets by mouth at bedtime.   Yes Historical Provider, MD  sodium phosphate (FLEET) enema Place 1 enema rectally See admin instructions. Reported on  05/11/2015   Yes Historical Provider, MD  tizanidine (ZANAFLEX) 2 MG capsule Take 1 capsule (2 mg total) by mouth 3 (three) times daily. 07/05/15  Yes Dennie Bible, NP  traMADol (ULTRAM) 50 MG tablet Take 50 mg by mouth every 6 (six) hours as needed for moderate pain.    Yes Historical Provider, MD  warfarin (COUMADIN) 1 MG tablet Take 1 mg by mouth as directed. Take along with 3mg  tablet on  Tuesday, Thursday, sat, and sun to make a total of 4   Yes Historical Provider, MD  warfarin (COUMADIN) 3 MG tablet Take 3 mg by mouth daily. Take 1 tablet every day but on Tuesday, Thursday, Saturday, & Sunday take along with 0.5mg  tablet   Yes Historical Provider, MD     ROS: The patient denies fevers, chills, night sweats, unintentional weight loss, chest pain, palpitations, wheezing, dyspnea on exertion, nausea, vomiting, abdominal pain, dysuria, hematuria, melena, numbness, weakness, or tingling.   All other systems have been reviewed and were otherwise negative with the exception of those mentioned in the HPI and as above.    PHYSICAL EXAM: Filed Vitals:   07/26/15 1302  BP: 122/80  Pulse: 107  Temp: 97.3 F (36.3 C)  Resp: 18   There is no weight on file to calculate BMI.   General: Alert, no acute distress. Lying in a wheelchair having spastic movements of the upper and lower extremities.  HEENT:  Normocephalic, atraumatic, oropharynx patent. Poor dentition. Mild redness of posterior oropharynx  Eye: EOMI, PEERLDC Cardiovascular:  Regular rate and rhythm, no rubs murmurs or gallops.  No Carotid bruits, radial pulse intact. No pedal edema.  Respiratory: Clear to auscultation bilaterally.  No wheezes, rales, or rhonchi.  No cyanosis, no use of accessory musculature Abdominal: No organomegaly, abdomen is soft and non-tender, positive bowel sounds.  No masses. Healed lower abdominal scar Musculoskeletal: Gait intact. No edema, tenderness Skin: No rashes. Tick bit left lateral hip is  approximately 3-11mm he has multiple moles with pigment on the truck area.  Neurologic: Facial musculature symmetric. Psychiatric: Patient acts appropriately throughout our interaction. Lymphatic: No cervical or submandibular lymphadenopathy   LABS: Results for orders placed or performed in visit on 07/26/15  POCT CBC  Result Value Ref Range   WBC 5.0 4.6 - 10.2 K/uL   Lymph, poc 1.5 0.6 - 3.4   POC LYMPH PERCENT 29.4 10 - 50 %L   MID (cbc) 0.4 0 - 0.9   POC MID % 8.2 0 - 12 %M   POC Granulocyte 3.1 2 - 6.9   Granulocyte percent 62.4 37 - 80 %G   RBC 4.87 4.69 - 6.13 M/uL   Hemoglobin 14.6 14.1 - 18.1 g/dL  HCT, POC 42.0 (A) 43.5 - 53.7 %   MCV 86.2 80 - 97 fL   MCH, POC 29.9 27 - 31.2 pg   MCHC 34.7 31.8 - 35.4 g/dL   RDW, POC 14.1 %   Platelet Count, POC 202 142 - 424 K/uL   MPV 6.6 0 - 99.8 fL  POCT rapid strep A  Result Value Ref Range   Rapid Strep A Screen Negative Negative   ASSESSMENT/PLAN: His main complaint today is sore throat. He did have a tick bite on Sunday. This was removed from his left lateral hip area. He was a very small tic consistent with a deer tick. He has not developed any redness around that area. His strep screen was negative. Strep culture was sent. Lyme titers were done. We'll hold off on antibiotics at present since patient is on Coumadin. His caregiver will watch the area for development of erythema chronicum migrans.I personally performed the services described in this documentation, which was scribed in my presence. The recorded information has been reviewed and is accurate.   Gross sideeffects, risk and benefits, and alternatives of medications d/w patient. Patient is aware that all medications have potential sideeffects and we are unable to predict every sideeffect or drug-drug interaction that may occur.  Arlyss Queen MD 07/26/2015 1:39 PM

## 2015-07-27 ENCOUNTER — Telehealth: Payer: Self-pay

## 2015-07-27 LAB — LYME AB/WESTERN BLOT REFLEX: B burgdorferi Ab IgG+IgM: <0.9 Index (ref <0.90)

## 2015-07-27 NOTE — Telephone Encounter (Signed)
PT-INR cancelled due to not enough blood sent in blue top.

## 2015-07-28 LAB — CULTURE, GROUP A STREP: Organism ID, Bacteria: NORMAL

## 2015-11-03 ENCOUNTER — Emergency Department (HOSPITAL_COMMUNITY)
Admission: EM | Admit: 2015-11-03 | Discharge: 2015-11-03 | Disposition: A | Discharge status: Home / Self Care | Payer: Medicare Other | Attending: Emergency Medicine | Admitting: Emergency Medicine

## 2015-11-03 ENCOUNTER — Emergency Department (HOSPITAL_BASED_OUTPATIENT_CLINIC_OR_DEPARTMENT_OTHER): Payer: Medicare Other

## 2015-11-03 ENCOUNTER — Encounter (HOSPITAL_COMMUNITY): Payer: Self-pay | Admitting: *Deleted

## 2015-11-03 DIAGNOSIS — I10 Essential (primary) hypertension: Secondary | ICD-10-CM | POA: Insufficient documentation

## 2015-11-03 DIAGNOSIS — L03116 Cellulitis of left lower limb: Secondary | ICD-10-CM | POA: Insufficient documentation

## 2015-11-03 DIAGNOSIS — Z7901 Long term (current) use of anticoagulants: Secondary | ICD-10-CM | POA: Diagnosis not present

## 2015-11-03 DIAGNOSIS — R609 Edema, unspecified: Secondary | ICD-10-CM | POA: Diagnosis not present

## 2015-11-03 DIAGNOSIS — M7989 Other specified soft tissue disorders: Secondary | ICD-10-CM | POA: Diagnosis present

## 2015-11-03 LAB — CBC WITH DIFFERENTIAL/PLATELET
BASOS ABS: 0 10*3/uL (ref 0.0–0.1)
Basophils Relative: 0 %
Eosinophils Absolute: 0.1 10*3/uL (ref 0.0–0.7)
Eosinophils Relative: 2 %
HEMATOCRIT: 43.8 % (ref 39.0–52.0)
Hemoglobin: 14.3 g/dL (ref 13.0–17.0)
LYMPHS ABS: 1.7 10*3/uL (ref 0.7–4.0)
LYMPHS PCT: 25 %
MCH: 29.4 pg (ref 26.0–34.0)
MCHC: 32.6 g/dL (ref 30.0–36.0)
MCV: 90.1 fL (ref 78.0–100.0)
MONO ABS: 0.6 10*3/uL (ref 0.1–1.0)
Monocytes Relative: 10 %
NEUTROS ABS: 4.3 10*3/uL (ref 1.7–7.7)
Neutrophils Relative %: 63 %
Platelets: 219 10*3/uL (ref 150–400)
RBC: 4.86 MIL/uL (ref 4.22–5.81)
RDW: 14.2 % (ref 11.5–15.5)
WBC: 6.8 10*3/uL (ref 4.0–10.5)

## 2015-11-03 LAB — COMPREHENSIVE METABOLIC PANEL
ALT: 22 U/L (ref 17–63)
AST: 23 U/L (ref 15–41)
Albumin: 3.7 g/dL (ref 3.5–5.0)
Alkaline Phosphatase: 54 U/L (ref 38–126)
Anion gap: 8 (ref 5–15)
BILIRUBIN TOTAL: 0.8 mg/dL (ref 0.3–1.2)
BUN: 10 mg/dL (ref 6–20)
CO2: 31 mmol/L (ref 22–32)
CREATININE: 0.97 mg/dL (ref 0.61–1.24)
Calcium: 9.4 mg/dL (ref 8.9–10.3)
Chloride: 103 mmol/L (ref 101–111)
GFR calc Af Amer: >60 mL/min (ref >60)
Glucose, Bld: 89 mg/dL (ref 65–99)
POTASSIUM: 4.4 mmol/L (ref 3.5–5.1)
Sodium: 142 mmol/L (ref 135–145)
TOTAL PROTEIN: 6.5 g/dL (ref 6.5–8.1)

## 2015-11-03 LAB — PROTIME-INR
INR: 2.11
PROTHROMBIN TIME: 24 s — AB (ref 11.4–15.2)

## 2015-11-03 MED ORDER — CEPHALEXIN 500 MG PO CAPS: 500.0000 mg | ORAL_CAPSULE | Freq: Three times a day (TID) | ORAL | 0 refills | Status: DC

## 2015-11-03 NOTE — ED Notes (Signed)
Patient Alert and oriented X4. Patient verbalized understanding of the discharge instructions.  Patient belongings were taken by the patient.

## 2015-11-03 NOTE — ED Notes (Signed)
Bed padded with blankets due to pt's spasms.

## 2015-11-03 NOTE — Discharge Planning (Signed)
[facility] reviewed discharging chart for possible CM needs.  Patient will be discharged with doxycycline as treatment for his cellulitis, he will need to follow-up with his primary care provider in 48 hours for a wound recheck to ensure improvement of symptoms. Return precaution discussed. Care discussed. Patient and caregiver who is at bedside. All questions answered to patient's satisfaction.  Care discussed with Dr. Darl Householder.    No CM needs identified.

## 2015-11-03 NOTE — ED Notes (Signed)
Pt transported for vascular ultrasound

## 2015-11-03 NOTE — Progress Notes (Signed)
VASCULAR LAB PRELIMINARY  PRELIMINARY  PRELIMINARY  PRELIMINARY  Left lower extremity venous duplex completed.    Preliminary report:  There is no obvious evidence of DVT or SVT noted in the left lower extremity.  Cannot rule out AF fistula in the left groin.  Called report to Domenic Moras, PA-C  Janee Ureste, [place], RVT 11/03/2015, 12:13 PM

## 2015-11-03 NOTE — ED Notes (Signed)
[name], caregiver, [phone]

## 2015-11-03 NOTE — ED Notes (Signed)
Pt transported to and from ultrasound on stretcher with tech and caregiver, tolerated well.

## 2015-11-03 NOTE — Discharge Instructions (Signed)
You have been evaluated for your leg swelling and redness.  No evidence of blood clot in your leg.  You have skin infection called Cellulitis.  Take antibiotic as prescribed.  Follow up with your doctor in 48 hrs for wound recheck.

## 2015-11-03 NOTE — ED Provider Notes (Signed)
[facility] DEPT Provider Note   CSN: [id number] Arrival date & time: 11/03/15  0841     History   Chief Complaint Chief Complaint  Patient presents with  . Leg Swelling    HPI [name] is a 35 y.o. male.  HPI   35 year old male with history of cerebral palsy, prior DVT legs as well as cellulitis accompanied by caregiver to ER for evaluation of left leg swelling. History obtained through caregiver who is at bedside. Caregiver has noticed increased leg swelling in the left leg for close to 2 weeks. Patient also reports occasional mild pain that is off-and-on unable to specify described the pain. He denies having any active pain at this time. He has had cellulitis and DVT to the same leg in the past. He is currently on Coumadin. 2 weeks ago his Coumadin was supratherapeutic, his PCP recommend holding his medication for 2 days. He had his Coumadin rechecked last week and it was subtherapeutic. Patient has been taking his medication as prescribed. Furthermore, caregiver also notice an area of redness to the lateral aspects of patient's left lower leg for close to a week. Patient denies having any fever, chills, lightheadedness of dizziness but does report mild pain with palpation to the affected area. No recent injury. Patient is wheelchair bound.  Past Medical History:  Diagnosis Date  . Cerebral palsy ([place])   . Chronic constipation   . Coagulopathy ([place])   . Hypertension   . Insomnia   . Recurrent UTI   . Tachycardia     Patient Active Problem List   Diagnosis Date Noted  . Cellulitis of left leg 05/13/2015  . Sepsis ([place]) 05/13/2015  . History of DVT (deep vein thrombosis) 05/13/2015  . Neurogenic bladder 05/13/2015  . Spasticity 12/02/2013  . Small bowel obstruction 06/05/2011  . Acute renal failure ([place]) 06/05/2011  . Chronic constipation 06/05/2011  . Hypertension 06/05/2011  . Hypokalemia 06/05/2011  . Low TSH level 06/05/2011  . Groin mass 06/05/2011  .  Loose stools 11/15/2010  . Bacteremia 10/16/2010  . MSSA (methicillin susceptible Staphylococcus aureus) septicemia ([place]) 10/16/2010  . Abscess of arm, left 10/16/2010  . CONSTIPATION 11/29/2009  . SHOULDR&UPPR ARM ABRASION/FRICTION BURN INFECTED 01/16/2007  . SORE THROAT 10/30/2006  . Atrial fibrillation ([place]) 08/27/2006  . ANEMIA, PERNICIOUS 07/25/2006  . Infantile cerebral palsy ([place]) 07/25/2006    Past Surgical History:  Procedure Laterality Date  . ELBOW SURGERY  09/12/10   left  . INCISE AND DRAIN ABCESS  09/11/10  . SMALL INTESTINE SURGERY    . TOOTH EXTRACTION  11/22/2011   Procedure: DENTAL RESTORATION/EXTRACTIONS;  Surgeon: Marcelo Baldy, MD;  Location: [facility];  Service: Dentistry;  Laterality: N/A;  dental restoration and cleaning; NO EXTRACTIONS       Home Medications    Prior to Admission medications   Medication Sig Start Date End Date Taking? Authorizing Provider  acetaminophen (TYLENOL) 325 MG tablet Take 650 mg by mouth every 6 (six) hours as needed. For pain    Historical Provider, MD  baclofen (LIORESAL) 20 MG tablet Take 1 tablet (20 mg total) by mouth 4 (four) times daily. 07/05/15   Dennie Bible, NP  calcium carbonate (TUMS - DOSED IN MG ELEMENTAL CALCIUM) 500 MG chewable tablet Chew 1 tablet by mouth 3 (three) times daily.    Historical Provider, MD  cetirizine (ZYRTEC) 10 MG tablet Take 10 mg by mouth daily.    Historical Provider, MD  cholecalciferol (VITAMIN D) 1000 UNITS  tablet Take 1,000 Units by mouth 2 (two) times daily.    Historical Provider, MD  cyclobenzaprine (FLEXERIL) 10 MG tablet 1 at 8am and 12pm and 2 tabs at 8pm. 07/05/15   Dennie Bible, NP  diazepam (VALIUM) 2 MG tablet Take 1 tablet (2 mg total) by mouth 2 (two) times daily. 07/05/15   Dennie Bible, NP  glycerin adult (GLYCERIN ADULT) 2 G SUPP Place 1 suppository rectally every other day.    Historical Provider, MD  hydrochlorothiazide (HYDRODIURIL) 25 MG tablet Take 25 mg  by mouth daily.    Historical Provider, MD  loperamide (IMODIUM) 2 MG capsule Take 2 mg by mouth 3 (three) times daily as needed for diarrhea or loose stools. Reported on 05/11/2015    Historical Provider, MD  metoprolol (TOPROL-XL) 100 MG 24 hr tablet Take 100 mg by mouth daily.     Historical Provider, MD  Multiple Vitamin (MULITIVITAMIN WITH MINERALS) TABS Take 1 tablet by mouth daily.    Historical Provider, MD  nitrofurantoin (MACRODANTIN) 50 MG capsule Take 1 capsule (50 mg total) by mouth daily. 05/23/15   Thurnell Lose, MD  senna-docusate (SENOKOT-S) 8.6-50 MG tablet Take 3 tablets by mouth at bedtime.    Historical Provider, MD  sodium phosphate (FLEET) enema Place 1 enema rectally See admin instructions. Reported on 05/11/2015    Historical Provider, MD  tizanidine (ZANAFLEX) 2 MG capsule Take 1 capsule (2 mg total) by mouth 3 (three) times daily. 07/05/15   Dennie Bible, NP  traMADol (ULTRAM) 50 MG tablet Take 50 mg by mouth every 6 (six) hours as needed for moderate pain.     Historical Provider, MD  warfarin (COUMADIN) 1 MG tablet Take 1 mg by mouth as directed. Take along with 3mg  tablet on  Tuesday, Thursday, sat, and sun to make a total of 4    Historical Provider, MD  warfarin (COUMADIN) 3 MG tablet Take 3 mg by mouth daily. Take 1 tablet every day but on Tuesday, Thursday, Saturday, & Sunday take along with 0.5mg  tablet    Historical Provider, MD    Family History Family History  Problem Relation Age of Onset  . Family history unknown: Yes    Social History Social History  Substance Use Topics  . Smoking status: Never Smoker  . Smokeless tobacco: Never Used  . Alcohol use No     Allergies   Betadine [povidone iodine]; Ciprofloxacin; and Morphine   Review of Systems Review of Systems  All other systems reviewed and are negative.    Physical Exam Updated Vital Signs BP 116/72 (BP Location: Left Arm)   Pulse 109   Temp 98.6 F (37 C) (Oral)   Resp 20    Wt 81.6 kg   SpO2 100%   BMI 25.83 kg/m   Physical Exam  Constitutional: No distress.  Chronically ill-appearing male laying in bed in no acute discomfort.  HENT:  Head: Atraumatic.  Eyes: Conjunctivae are normal.  Neck: Neck supple.  Cardiovascular: Regular rhythm and intact distal pulses.   Mild tachycardia without murmurs rubs or gallops  Pulmonary/Chest: Effort normal and breath sounds normal.  Abdominal: Soft. There is no tenderness.  Musculoskeletal: He exhibits edema (2+ pitting edema to left lower extremity).  Neurological: He is alert.  Skin: No rash (LLE: a dime-size area of erythema noted to lateral distal leg without induration or fluctuant. Mild tender to palpation.) noted.  Psychiatric: He has a normal mood and affect.  Nursing note  and vitals reviewed.    ED Treatments / Results  Labs (all labs ordered are listed, but only abnormal results are displayed) Labs Reviewed  PROTIME-INR - Abnormal; Notable for the following:       Result Value   Prothrombin Time 24.0 (*)    All other components within normal limits  CBC WITH DIFFERENTIAL/PLATELET  COMPREHENSIVE METABOLIC PANEL    EKG  EKG Interpretation None       Radiology No results found.  Procedures Procedures (including critical care time)  Iantha Fallen, RVS  Vascular Lab    [] Hide copied text [] Hover for attribution information VASCULAR LAB PRELIMINARY  PRELIMINARY  PRELIMINARY  PRELIMINARY  Left lower extremity venous duplex completed.    Preliminary report:  There is no obvious evidence of DVT or SVT noted in the left lower extremity.  Cannot rule out AF fistula in the left groin.  Called report to Domenic Moras, PA-C  KANADY, [place], RVT 11/03/2015, 12:13 PM         Medications Ordered in ED Medications - No data to display   Initial Impression / Assessment and Plan / ED Course  I have reviewed the triage vital signs and the nursing notes.  Pertinent labs &  imaging results that were available during my care of the patient were reviewed by me and considered in my medical decision making (see chart for details).  Clinical Course    BP 108/69   Pulse 86   Temp 98.6 F (37 C) (Oral)   Resp 16   Wt 81.6 kg   SpO2 100%   BMI 25.83 kg/m    Final Clinical Impressions(s) / ED Diagnoses   Final diagnoses:  Left leg swelling  Left leg cellulitis    New Prescriptions New Prescriptions   CEPHALEXIN (KEFLEX) 500 MG CAPSULE    Take 1 capsule (500 mg total) by mouth 3 (three) times daily.   9:42 AM Patient here for increased left lower extremity swelling. Prior history of DVT. He will need to have a Doppler study to evaluate for potential recurrent DVT. He is on warfarin currently and was told that it was subtherapeutic earlier in the week. I will recheck PT and INR. He does have a small area of erythema to the lateral distal left leg which may suggest early cellulitis. He is afebrile without any systemic manifestation. Plan to treat with abx  12:17 PM Vascular tech performed LLE venous doppler.  NO evidence of DVT but cannot r/o AV fistula to L groin. I discussed this with Dr. Darl Householder, Plan to obtain arterior doppler US of LLE for further evaluation.  On reexamination, no obvious vascular bruits appreciated in L groin.    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 Therefore, the finding on the left lower extremity ultrasound is not new. Patient will be discharged with doxycycline as treatment for his cellulitis, he will need to follow-up with his primary care provider in 48 hours for a wound recheck to ensure improvement of symptoms. Return precaution discussed. Care discussed. Patient and caregiver who is at bedside. All questions answered to patient's satisfaction.  Care discussed with Dr. Darl Householder.     Domenic Moras, PA-C 11/03/15 [place], MD 11/03/15 [phone]

## 2015-11-03 NOTE — ED Triage Notes (Signed)
Caregiver reports pt having swelling to entire left leg with areas of redness and warmth. Hx of dvt and cellulitis.

## 2015-11-21 ENCOUNTER — Ambulatory Visit: Payer: Medicare Other | Admitting: Family Medicine

## 2015-12-28 ENCOUNTER — Telehealth: Payer: Self-pay | Admitting: Nurse Practitioner

## 2015-12-28 NOTE — Telephone Encounter (Signed)
Done

## 2015-12-28 NOTE — Telephone Encounter (Signed)
Patient's caregiver is calling to give the name of the patient's new PCP. His name is Dr. [name]. Please change in chart.

## 2016-01-03 ENCOUNTER — Ambulatory Visit (INDEPENDENT_AMBULATORY_CARE_PROVIDER_SITE_OTHER): Payer: Medicare Other | Admitting: Neurology

## 2016-01-03 ENCOUNTER — Encounter: Payer: Self-pay | Admitting: Neurology

## 2016-01-03 VITALS — BP 115/80 | HR 85

## 2016-01-03 DIAGNOSIS — G809 Cerebral palsy, unspecified: Secondary | ICD-10-CM | POA: Diagnosis not present

## 2016-01-03 DIAGNOSIS — R252 Cramp and spasm: Secondary | ICD-10-CM | POA: Diagnosis not present

## 2016-01-03 DIAGNOSIS — N319 Neuromuscular dysfunction of bladder, unspecified: Secondary | ICD-10-CM

## 2016-01-03 MED ORDER — DIAZEPAM 2 MG PO TABS: 2.0000 mg | ORAL_TABLET | Freq: Two times a day (BID) | ORAL | 3 refills | Status: DC

## 2016-01-03 MED ORDER — TIZANIDINE HCL 2 MG PO CAPS: 2.0000 mg | ORAL_CAPSULE | Freq: Three times a day (TID) | ORAL | 4 refills | Status: DC

## 2016-01-03 MED ORDER — BACLOFEN 20 MG PO TABS
20.0000 mg | ORAL_TABLET | Freq: Four times a day (QID) | ORAL | 4 refills | Status: DC
Start: 2016-01-03 — End: 2016-01-08

## 2016-01-03 MED ORDER — CYCLOBENZAPRINE HCL 10 MG PO TABS: ORAL_TABLET | ORAL | 4 refills | Status: DC

## 2016-01-03 NOTE — Progress Notes (Addendum)
[facility]  PATIENT: [name] DOB: Jun 03, 1980   HISTORY OF PRESENT ILLNESS: [name] is a 35 years old right-handed male, accompanied by his caregiver [name] for evaluation of spasticity. He was born with cerebral palsy, has been wheelchair-bound, now lives alone, with his caregiver lives in with him, his father, stepmother, grandmother, is actively involved in his care. 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 He suffered bilateral lower extremity DVT in 2000, and 2005,on chronic Coumadin treatment, Over the years, his spasticity has been controlled by medications, baclofen 20 mg 4 times a day, Valium 2 mg twice a day, Flexeril 10 mg 3 times a day, but a spasticity has gradually worsened over time, sometimes it was so forceful, he ripped the arm rest  UPDATE May 19 2014:He is with his caregiver daily, he still works full time, he likes current medication changes, baclofen 20 mg 4 times a day, Valium 2 mg twice a day, Flexeril 10 mg 3 times a day, I have added on tizanidine 2 mg 3 times a day, works well,  UPDATE 12/06/14: [name] returns with his caregiver. He is having more spasticity first thing in the morning and before bed. He is currently on baclofen 20 mg, 4 times a day, Flexeril 10 mg 3 times a day, Valium 2 mg twice daily and tizanidine 2 mg 3 times daily. He returns for reevaluation  UPDATE Dec 6th 2017: He still works fulltime, Medical sales representative work, data entry, he wears helmet, his spasticity is under good conrol, he is now  baclofen 20mg  4 times a day, valium 2mg  twice, flexeril 10mg /10/20mg , tizanidine 2mg  tid.   REVIEW OF SYSTEMS: Full 14 system review of systems performed and notable only for those listed, all others are neg:  As above   ALLERGIES: Allergies  Allergen Reactions  . Betadine [Povidone Iodine] Other (See Comments)    Unknown reaction per caregiver  . Ciprofloxacin Other (See Comments)    Unknown reaction per caregiver  . Morphine Other (See Comments)    Unknown reaction per caregiver    HOME MEDICATIONS: Outpatient Medications Prior to Visit  Medication Sig Dispense Refill  . acetaminophen (TYLENOL) 325 MG tablet Take 650 mg by mouth every 6 (six) hours as needed. For pain    . baclofen (LIORESAL) 20 MG tablet Take 1 tablet (20 mg total) by mouth 4 (four) times daily. 360 each 3  . calcium carbonate (TUMS - DOSED IN MG ELEMENTAL CALCIUM) 500 MG chewable tablet Chew 1 tablet by mouth 3 (three) times daily.    . cetirizine (ZYRTEC) 10 MG tablet Take 10 mg by mouth daily.    . cholecalciferol (VITAMIN D) 1000 UNITS tablet Take 1,000 Units by mouth 2 (two) times daily.    . cyclobenzaprine (FLEXERIL) 10 MG tablet 1 at 8am and 12pm and 2 tabs at 8pm. 360 tablet 1  . diazepam (VALIUM) 2 MG tablet Take 1 tablet (2 mg total) by mouth 2 (two) times daily. 60 tablet 5  . diphenhydrAMINE (BENADRYL) 25 MG tablet Take 25 mg by mouth every 6 (six) hours as needed for allergies.    Marland Kitchen  fluticasone (VERAMYST) 27.5 MCG/SPRAY nasal spray Place 1 spray into the nose at bedtime as needed for rhinitis.    Marland Kitchen glycerin adult (GLYCERIN ADULT) 2 G SUPP Place 1 suppository rectally every other day.    . hydrochlorothiazide (HYDRODIURIL) 25 MG tablet Take 25 mg by mouth daily.    Marland Kitchen loperamide (IMODIUM) 2 MG capsule Take 2 mg by mouth 3 (three) times daily as needed for diarrhea or loose stools. Reported on 05/11/2015    . metoprolol (TOPROL-XL) 100 MG 24 hr tablet Take 100 mg by mouth daily.     . mineral oil enema  Place 1 enema rectally as needed for severe constipation.    . Multiple Vitamin (MULITIVITAMIN WITH MINERALS) TABS Take 1 tablet by mouth daily.    . nitrofurantoin (MACRODANTIN) 50 MG capsule Take 1 capsule (50 mg total) by mouth daily.    Marland Kitchen senna-docusate (SENOKOT-S) 8.6-50 MG tablet Take 3 tablets by mouth at bedtime.    . tizanidine (ZANAFLEX) 2 MG capsule Take 1 capsule (2 mg total) by mouth 3 (three) times daily. 270 capsule 1  . traMADol (ULTRAM) 50 MG tablet Take 50 mg by mouth every 6 (six) hours as needed for moderate pain.     Marland Kitchen warfarin (COUMADIN) 1 MG tablet Take 1 mg by mouth as directed. Take along with 3mg  tablet on  Tuesday, Thursday, sat, and sun to make a total of 4    . warfarin (COUMADIN) 3 MG tablet Take 3 mg by mouth daily. Take 1 tablet every day but on Tuesday, Thursday, Saturday, & Sunday take along with 0.5mg  tablet    . cephALEXin (KEFLEX) 500 MG capsule Take 1 capsule (500 mg total) by mouth 3 (three) times daily. 21 capsule 0   No facility-administered medications prior to visit.     PAST MEDICAL HISTORY: Past Medical History:  Diagnosis Date  . Cerebral palsy ([place])   . Chronic constipation   . Coagulopathy ([place])   . Hypertension   . Insomnia   . Recurrent UTI   . Tachycardia     PAST SURGICAL HISTORY: Past Surgical History:  Procedure Laterality Date  . ELBOW SURGERY  09/12/10   left  . INCISE AND DRAIN ABCESS  09/11/10  . SMALL INTESTINE SURGERY    . TOOTH EXTRACTION  11/22/2011   Procedure: DENTAL RESTORATION/EXTRACTIONS;  Surgeon: Marcelo Baldy, MD;  Location: [facility];  Service: Dentistry;  Laterality: N/A;  dental restoration and cleaning; NO EXTRACTIONS    FAMILY HISTORY: Family History  Problem Relation Age of Onset  . Family history unknown: Yes    SOCIAL HISTORY: Social History   Social History  . Marital status: Single    Spouse name: N/A  . Number of children: N/A  . Years of education: N/A   Occupational History  . Not on file.     Social History Main Topics  . Smoking status: Never Smoker  . Smokeless tobacco: Never Used  . Alcohol use No  . Drug use: No  . Sexual activity: No   Other Topics Concern  . Not on file   Social History Narrative  . No narrative on file     PHYSICAL EXAM  Vitals:   01/03/16 0851  BP: 115/80  Pulse: 85   There is no height or weight on file to calculate BMI. Generalized: In no acute distress Neck: Supple, no carotid bruits  Cardiac: Regular rate rhythm Pulmonary: Clear to auscultation bilaterally  Neurological examination Mentation: he sits in  wheelchair, slow spastic dysarthria, Cranial nerve II-XII: Pupils were equal round reactive to light. Extraocular movements were full. Visual field were full on confrontational test.Facial sensation and strength were normal. Hearing was intact to finger rubbing bilaterally. Uvula tongue midline. He has slow spastic tongue movement, Motor: Profound spasticity of bilateral upper and lower extremities,right arm has relatively free movement,tends to stay in right shoulder extension, elbow extension, wrist finger flexion, more profound left upper extremity spasticity, with left shoulder extension, elbow flexion, wrist extension, finger flexion, Trace movement of left foot, bilateral knee contraction. Limbs were hold in place by straps. Sensory: Intact to fine touch, pinprick,Vibratory Gait: deferred Deep tendon reflexes: Hypoactive and symmetric  DIAGNOSTIC DATA (LABS, IMAGING, TESTING) - I reviewed patient records, labs, notes, testing and imaging myself where available.  Lab Results  Component Value Date   WBC 6.8 11/03/2015   HGB 14.3 11/03/2015   HCT 43.8 11/03/2015   MCV 90.1 11/03/2015   PLT 219 11/03/2015      Component Value Date/Time   NA 142 11/03/2015 1215   K 4.4 11/03/2015 1215   CL 103 11/03/2015 1215   CO2 31 11/03/2015 1215   GLUCOSE 89 11/03/2015 1215   BUN 10 11/03/2015 1215   CREATININE 0.97  11/03/2015 1215   CREATININE 1.00 07/26/2015 1426   CALCIUM 9.4 11/03/2015 1215   PROT 6.5 11/03/2015 1215   ALBUMIN 3.7 11/03/2015 1215   AST 23 11/03/2015 1215   ALT 22 11/03/2015 1215   ALKPHOS 54 11/03/2015 1215   BILITOT 0.8 11/03/2015 1215   GFRNONAA >60 11/03/2015 1215   GFRNONAA >89 07/26/2015 1426   GFRAA >60 11/03/2015 1215   GFRAA >89 07/26/2015 1426    ASSESSMENT AND PLAN 35 y.o. year old male  Cerebral palsy Spastic quadriplegia  Refill his medications, Flexeril 10 mg 1 tablet 8 a.m. 1 tablet around noon and 2 tabs at bedtime will renew prescription  Continue Valium 2 mg twice daily will renew   Continue Zanaflex 2 mg 3 times daily   Baclofen 20 mg 3 times a day  Marcial Pacas, M.D. Ph.D.  [facility] [place], [place] [address] Phone: [phone] Fax:      [phone]

## 2016-01-04 ENCOUNTER — Other Ambulatory Visit: Payer: Self-pay | Admitting: *Deleted

## 2016-01-08 ENCOUNTER — Other Ambulatory Visit: Payer: Self-pay | Admitting: *Deleted

## 2016-01-08 MED ORDER — TIZANIDINE HCL 2 MG PO CAPS: 2.0000 mg | ORAL_CAPSULE | Freq: Three times a day (TID) | ORAL | 3 refills | Status: DC

## 2016-01-08 MED ORDER — CYCLOBENZAPRINE HCL 10 MG PO TABS: ORAL_TABLET | ORAL | 3 refills | Status: DC

## 2016-01-08 MED ORDER — BACLOFEN 20 MG PO TABS: 20.0000 mg | ORAL_TABLET | Freq: Four times a day (QID) | ORAL | 3 refills | Status: DC

## 2016-05-15 ENCOUNTER — Telehealth: Payer: Self-pay | Admitting: Neurology

## 2016-05-15 ENCOUNTER — Other Ambulatory Visit: Payer: Self-pay | Admitting: *Deleted

## 2016-05-15 MED ORDER — DIAZEPAM 2 MG PO TABS: 2.0000 mg | ORAL_TABLET | Freq: Two times a day (BID) | ORAL | 5 refills | Status: DC

## 2016-05-15 NOTE — Telephone Encounter (Signed)
Pt's caregiver [name] [phone] request new RX for diazepam (VALIUM) 2 MG tablet sent to [facility]. Pt will be out of the medication as of this evening.

## 2016-05-15 NOTE — Telephone Encounter (Signed)
Pt caregiver called again concerned about refill.  He stated the pharmacy will deliver the medication but they have to have the order before the end of business. He is asking to be called at [phone])

## 2016-05-15 NOTE — Telephone Encounter (Addendum)
I returned call to caregiver, [name] and he verbalized understanding.  They will have the prescription filled at [facility] in [place], [place].

## 2016-05-15 NOTE — Telephone Encounter (Signed)
Patient's caregiver was provided a printed prescription at his last appt on 01/02/17.  I have spoken to [place] (caregiver) and he is unsure what happened to this prescription.  I called the pharmacy and the last refill on record was 02/22/16 from Alvester Chou, NP.  There are no refills on file.  Dr. Krista Blue has agreed to offer a one time replacement for this prescription.  They must have it filled in [place] so there is a record of refills in the [facility] Narcotic Registry.

## 2016-07-16 ENCOUNTER — Ambulatory Visit (INDEPENDENT_AMBULATORY_CARE_PROVIDER_SITE_OTHER): Payer: Medicare Other | Admitting: Nurse Practitioner

## 2016-07-16 ENCOUNTER — Encounter: Payer: Self-pay | Admitting: Nurse Practitioner

## 2016-07-16 VITALS — BP 117/74 | HR 94 | Ht 70.0 in | Wt 197.0 lb

## 2016-07-16 DIAGNOSIS — G809 Cerebral palsy, unspecified: Secondary | ICD-10-CM

## 2016-07-16 DIAGNOSIS — R252 Cramp and spasm: Secondary | ICD-10-CM

## 2016-07-16 NOTE — Progress Notes (Signed)
I have reviewed and agreed above plan. 

## 2016-07-16 NOTE — Patient Instructions (Signed)
Per medical consult form

## 2016-07-16 NOTE — Progress Notes (Signed)
[facility]  PATIENT: [name] DOB: 10/02/1980   REASON FOR VISIT: Follow-up for spasticity, cerebral palsy HISTORY FROM: Patient and caregiver    HISTORY OF PRESENT ILLNESS:YYChristopher [name] is a 36 years old right-handed male, accompanied by his caregiver [name] for evaluation of spasticity. He was born with cerebral palsy, has been wheelchair-bound, now lives alone, with his caregiver lives in with him, his father, stepmother, grandmother, is actively involved in his care. 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 He suffered bilateral lower extremity DVT in 2000, and 2005,on chronic Coumadin treatment, Over the years, his spasticity has been controlled by medications, baclofen 20 mg 4 times a day, Valium 2 mg twice a day, Flexeril 10 mg 3 times a day, but a spasticity has gradually worsened over time, sometimes it was so forceful, he ripped the arm rest  UPDATE May 19 2014:YYHe is with his caregiver daily, he still works full time, he likes current medication changes, baclofen 20 mg 4 times a day, Valium 2 mg twice a day, Flexeril 10 mg 3 times a day, I have added on tizanidine 2 mg 3 times a day, works well,  UPDATE 12/06/14: YYChristopher returns with his caregiver. He is having more spasticity first thing in the morning and before bed. He is currently on baclofen 20 mg, 4 times a day, Flexeril 10 mg 3 times a day, Valium 2 mg twice daily and tizanidine 2 mg 3 times daily. He returns for reevaluation  UPDATE Dec 6th 2017:YY He  still works fulltime, Medical sales representative work, data entry, he wears helmet, his spasticity is under good conrol, he is now baclofen 20mg  4 times a day, valium 2mg  twice, flexeril 10mg /10/20mg , tizanidine 2mg  tid.  UPDATE 06/19/2018CM [name], 36 year old male returns for follow-up with his caregiver. He continues to live in an alternative family situation. He has a history of CP and significant spasticity which is currently well-controlled. He is wheelchair-bound. He does have some control of his right upper extremity minimal use of his lower extremities. 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 Appetite is good and he sleeps well REVIEW OF SYSTEMS: Full 14 system review of systems performed and notable only for those listed, all others are neg:  Constitutional: neg  Cardiovascular: neg Ear/Nose/Throat: neg  Skin: neg Eyes: neg Respiratory: neg Gastroitestinal: Constipation Hematology/Lymphatic: neg  Endocrine: neg Musculoskeletal: Wheelchair-bound Allergy/Immunology: neg Neurological: Speech difficulty Psychiatric: neg Sleep : neg   ALLERGIES: Allergies  Allergen Reactions  . Betadine [Povidone Iodine] Other (See Comments)    Unknown reaction per caregiver  . Ciprofloxacin Other (See Comments)    Unknown reaction per caregiver  . Morphine Other (See Comments)    Unknown reaction per caregiver    HOME MEDICATIONS: Outpatient Medications Prior to Visit  Medication Sig Dispense Refill  . acetaminophen (TYLENOL) 325 MG tablet Take 650 mg by mouth every 6 (six) hours as needed. For pain    . baclofen (LIORESAL) 20 MG tablet Take 1 tablet (20 mg total) by mouth 4 (four) times daily. 360 each 3  . calcium carbonate (TUMS - DOSED  IN MG ELEMENTAL CALCIUM) 500 MG chewable tablet Chew 1 tablet by mouth 3 (three) times daily.    . cetirizine (ZYRTEC) 10 MG tablet Take 10 mg by mouth daily.    . cholecalciferol (VITAMIN D) 1000 UNITS tablet Take 1,000 Units by mouth 2 (two)  times daily.    . cyclobenzaprine (FLEXERIL) 10 MG tablet 1 at 8am and 12pm and 2 tabs at 8pm. 360 tablet 3  . diazepam (VALIUM) 2 MG tablet Take 1 tablet (2 mg total) by mouth 2 (two) times daily. Must last 30 days. 60 tablet 5  . diphenhydrAMINE (BENADRYL) 25 MG tablet Take 25 mg by mouth every 6 (six) hours as needed for allergies.    . fluticasone (VERAMYST) 27.5 MCG/SPRAY nasal spray Place 1 spray into the nose at bedtime as needed for rhinitis.    Marland Kitchen glycerin adult (GLYCERIN ADULT) 2 G SUPP Place 1 suppository rectally every other day.    . hydrochlorothiazide (HYDRODIURIL) 25 MG tablet Take 25 mg by mouth daily.    Marland Kitchen loperamide (IMODIUM) 2 MG capsule Take 2 mg by mouth 3 (three) times daily as needed for diarrhea or loose stools. Reported on 05/11/2015    . metoprolol (TOPROL-XL) 100 MG 24 hr tablet Take 100 mg by mouth daily.     . mineral oil enema Place 1 enema rectally as needed for severe constipation.    . Multiple Vitamin (MULITIVITAMIN WITH MINERALS) TABS Take 1 tablet by mouth daily.    . nitrofurantoin (MACRODANTIN) 50 MG capsule Take 1 capsule (50 mg total) by mouth daily.    Marland Kitchen senna-docusate (SENOKOT-S) 8.6-50 MG tablet Take 3 tablets by mouth at bedtime.    . tizanidine (ZANAFLEX) 2 MG capsule Take 1 capsule (2 mg total) by mouth 3 (three) times daily. 270 capsule 3  . traMADol (ULTRAM) 50 MG tablet Take 50 mg by mouth every 6 (six) hours as needed for moderate pain.     Marland Kitchen warfarin (COUMADIN) 1 MG tablet Take by mouth as directed. Takes 0.5mg  Tues, Thurs, Sat, Sun. Along with 3 mg.    . warfarin (COUMADIN) 3 MG tablet Take 3 mg by mouth daily. Take 1 tablet every day but on Tuesday, Thursday, Saturday, & Sunday take along with 1/2 tab of 1 mg.     No facility-administered medications prior to visit.     PAST MEDICAL HISTORY: Past Medical History:  Diagnosis Date  . Cerebral palsy ([place])   . Chronic constipation   . Coagulopathy ([place])   . Hypertension   . Insomnia   .  Recurrent UTI   . Tachycardia     PAST SURGICAL HISTORY: Past Surgical History:  Procedure Laterality Date  . ELBOW SURGERY  09/12/10   left  . INCISE AND DRAIN ABCESS  09/11/10  . SMALL INTESTINE SURGERY    . TOOTH EXTRACTION  11/22/2011   Procedure: DENTAL RESTORATION/EXTRACTIONS;  Surgeon: Marcelo Baldy, MD;  Location: [facility];  Service: Dentistry;  Laterality: N/A;  dental restoration and cleaning; NO EXTRACTIONS    FAMILY HISTORY: Family History  Problem Relation Age of Onset  . Family history unknown: Yes    SOCIAL HISTORY: Social History   Social History  . Marital status: Single    Spouse name: N/A  . Number of children: N/A  . Years of education: N/A   Occupational History  . Not on file.   Social History Main Topics  . Smoking status: Never Smoker  . Smokeless tobacco: Never Used  .  Alcohol use No  . Drug use: No  . Sexual activity: No   Other Topics Concern  . Not on file   Social History Narrative  . No narrative on file     PHYSICAL EXAM  Vitals:   07/16/16 0818  BP: 117/74  Pulse: 94  Weight: 197 lb (89.4 kg)  Height: 5\' 10"  (1.778 m)   Body mass index is 28.27 kg/m.  Generalized: Well developed, in no acute distress  Head: normocephalic and atraumatic,. Oropharynx benign  Neck: Supple,   Cardiac: Regular rate rhythm, no murmur  Musculoskeletal: No deformity   Neurological examination   Mentation: Alert Slow spastic dysarthria, follows most commands  Cranial nerve II-XII: Pupils were equal round reactive to light extraocular movements were full, visual field were full on confrontational test. Facial sensation and strength were normal. hearing was intact to finger rubbing bilaterally. Uvula tongue midline. head turning and shoulder shrug were normal and symmetric.he has slow spastic tongue movement  Motor: Profound spasticity of bilateral upper and lower extremities, right arm has relatively frequent movement tends to stay in right  shoulder extension elbow extension risk finger flexion or profound left upper extremity spasticity with left shoulder extension elbow flexion and wrist and finger flexion. Trace movement of left but bilateral knee contractions limbs are held in place by straps. Sensory: normal and symmetric to light touch, pinprick, and  Vibration, proprioception  Coordination: finger-nose-finger, heel-to-shin bilaterally, no dysmetria Reflexes: Hypoactive and symmetric  Gait and Station: Deferred  DIAGNOSTIC DATA (LABS, IMAGING, TESTING) - I reviewed patient records, labs, notes, testing and imaging myself where available.  Lab Results  Component Value Date   WBC 6.8 11/03/2015   HGB 14.3 11/03/2015   HCT 43.8 11/03/2015   MCV 90.1 11/03/2015   PLT 219 11/03/2015      Component Value Date/Time   NA 142 11/03/2015 1215   K 4.4 11/03/2015 1215   CL 103 11/03/2015 1215   CO2 31 11/03/2015 1215   GLUCOSE 89 11/03/2015 1215   BUN 10 11/03/2015 1215   CREATININE 0.97 11/03/2015 1215   CREATININE 1.00 07/26/2015 1426   CALCIUM 9.4 11/03/2015 1215   PROT 6.5 11/03/2015 1215   ALBUMIN 3.7 11/03/2015 1215   AST 23 11/03/2015 1215   ALT 22 11/03/2015 1215   ALKPHOS 54 11/03/2015 1215   BILITOT 0.8 11/03/2015 1215   GFRNONAA >60 11/03/2015 1215   GFRNONAA >89 07/26/2015 1426   GFRAA >60 11/03/2015 1215   GFRAA >89 07/26/2015 1426   Lab Results  Component Value Date   CHOL  09/30/2008    87        ATP III CLASSIFICATION:  <200     mg/dL   Desirable  200-239  mg/dL   Borderline High  >=240    mg/dL   High          TRIG 50 09/30/2008   No results found for: HGBA1C No results found for: VITAMINB12 Lab Results  Component Value Date   TSH 0.225 (L) 06/03/2011      ASSESSMENT AND PLAN  36 y.o. year old male  has a past medical history of Cerebral palsy ([place]); Chronic constipation; Coagulopathy ([place]); Hypertension; Insomnia; Recurrent UTI; and Tachycardia. 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

## 2016-10-17 ENCOUNTER — Ambulatory Visit (HOSPITAL_COMMUNITY)
Admission: RE | Admit: 2016-10-17 | Discharge: 2016-10-17 | Disposition: A | Discharge status: Home / Self Care | Payer: Medicare Other | Source: Ambulatory Visit | Attending: Vascular Surgery | Admitting: Vascular Surgery

## 2016-10-17 ENCOUNTER — Other Ambulatory Visit (HOSPITAL_COMMUNITY): Payer: Self-pay | Admitting: Internal Medicine

## 2016-10-17 DIAGNOSIS — R600 Localized edema: Secondary | ICD-10-CM

## 2016-10-17 DIAGNOSIS — I82412 Acute embolism and thrombosis of left femoral vein: Secondary | ICD-10-CM | POA: Diagnosis not present

## 2016-10-17 DIAGNOSIS — M7989 Other specified soft tissue disorders: Secondary | ICD-10-CM | POA: Diagnosis present

## 2016-11-07 ENCOUNTER — Other Ambulatory Visit: Payer: Self-pay

## 2016-11-07 MED ORDER — DIAZEPAM 2 MG PO TABS: 2.0000 mg | ORAL_TABLET | Freq: Two times a day (BID) | ORAL | 5 refills | Status: DC

## 2016-11-07 NOTE — Telephone Encounter (Signed)
Faxed to pharmacy

## 2016-11-07 NOTE — Telephone Encounter (Signed)
Ok to refill diazepam? 

## 2016-11-07 NOTE — Telephone Encounter (Signed)
Refill request for Diazepam 2mg  tablets received from [facility], fax number [phone]. Rx was last given on 05/15/16 for #60 with 5 additional refills. Ok to refill?

## 2016-11-07 NOTE — Addendum Note (Signed)
Addended by: Marcial Pacas on: 11/07/2016 11:33 AM   Modules accepted: Orders

## 2016-11-11 NOTE — Telephone Encounter (Signed)
Closing encounter

## 2016-11-26 ENCOUNTER — Other Ambulatory Visit: Payer: Self-pay | Admitting: Neurology

## 2017-02-21 ENCOUNTER — Telehealth: Payer: Self-pay | Admitting: Nurse Practitioner

## 2017-02-21 MED ORDER — BACLOFEN 20 MG PO TABS: 20.0000 mg | ORAL_TABLET | Freq: Four times a day (QID) | ORAL | 3 refills | Status: DC

## 2017-02-21 MED ORDER — CYCLOBENZAPRINE HCL 10 MG PO TABS: ORAL_TABLET | ORAL | 3 refills | Status: DC

## 2017-02-21 MED ORDER — TIZANIDINE HCL 2 MG PO CAPS: 2.0000 mg | ORAL_CAPSULE | Freq: Three times a day (TID) | ORAL | 3 refills | Status: DC

## 2017-02-21 NOTE — Telephone Encounter (Signed)
Called Jobba with [facility]. She stated the patient receives financial assistance from them. They must have updated Rxs on file to assist patient. She requested new Rx for flexeril, tizanidine and baclofen, all expiring in the next 1-2 weeks sent to [facility]. She stated [facility] needs paper Rxs with today's date, ordering physician for his file. This RN reordered, and faxed all three to [facility], attention Jobba Penn.

## 2017-02-21 NOTE — Telephone Encounter (Signed)
Jobba Penn/[facility] [phone] Needs a recent order for tizanidine (ZANAFLEX) 2 MG capsule faxed to [phone]

## 2017-05-19 ENCOUNTER — Other Ambulatory Visit: Payer: Self-pay | Admitting: Nurse Practitioner

## 2017-07-17 ENCOUNTER — Ambulatory Visit: Payer: 59 | Admitting: Nurse Practitioner

## 2017-07-21 NOTE — Progress Notes (Signed)
[facility]  PATIENT: [name] DOB: Mar 04, 1980   REASON FOR VISIT: Follow-up for spasticity, cerebral palsy HISTORY FROM: Patient and caregiver [name] is a 37 years old right-handed male, accompanied by his caregiver [name] for evaluation of spasticity. He was born with cerebral palsy, has been wheelchair-bound, now lives alone, with his caregiver lives in with him, his father, stepmother, grandmother, is actively involved in his care. 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 He suffered bilateral lower extremity DVT in 2000, and 2005,on chronic Coumadin treatment, Over the years, his spasticity has been controlled by medications, baclofen 20 mg 4 times a day, Valium 2 mg twice a day, Flexeril 10 mg 3 times a day, but a spasticity has gradually worsened over time, sometimes it was so forceful, he ripped the arm rest  UPDATE May 19 2014:YYHe is with his caregiver daily, he still works full time, he likes current medication changes, baclofen 20 mg 4 times a day, Valium 2 mg twice a day, Flexeril 10 mg 3 times a day, I have added on tizanidine 2 mg 3 times a day, works well,  UPDATE 12/06/14: YYChristopher returns with his caregiver. He is having more spasticity first thing in the morning and before bed. He is currently on baclofen 20 mg, 4 times a day, Flexeril 10 mg 3 times a day, Valium 2 mg twice daily and tizanidine 2 mg 3 times daily. He returns for reevaluation  UPDATE Dec 6th  2017:YY He still works fulltime, Medical sales representative work, data entry, he wears helmet, his spasticity is under good conrol, he is now baclofen 20mg  4 times a day, valium 2mg  twice, flexeril 10mg /10/20mg , tizanidine 2mg  tid.  UPDATE 06/19/2018CM [name], 37 year old male returns for follow-up with his caregiver. He continues to live in an alternative family situation. He has a history of CP and significant spasticity which is currently well-controlled. He is wheelchair-bound. He does have some control of his right upper extremity minimal use of his lower extremities. 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 Appetite is good and he sleeps well  UPDATE 6/25/ 2019CM [name], 37 year old male returns for follow-up with his caregiver.  He has a history of significant spasticity and cerebral palsy.  His symptoms are well controlled on medications.  He is wheelchair-bound.  He continues to live in an alternative family situation with 24/7 care.  He does have some control of his right upper extremity minimal use of lower extremities.  He continues at [facility].  Appetite is good and he is sleeping well.  He had recent ultrasound for DVT due to thigh swelling.  They do not know the results.  Encouraged to check with primary care who ordered this.  He remains on Coumadin most recent INR this week 2.1.  He returns for reevaluation REVIEW OF SYSTEMS: Full 14 system review of systems performed and notable only for those listed, all others are neg:  Constitutional: neg  Cardiovascular: Leg swelling on the left Ear/Nose/Throat: neg  Skin: neg Eyes: neg Respiratory: neg Gastroitestinal: Constipation Hematology/Lymphatic: neg  Endocrine: neg Musculoskeletal: Wheelchair-bound Allergy/Immunology: neg Neurological: Speech difficulty Psychiatric: neg Sleep : neg   ALLERGIES: Allergies  Allergen Reactions  . Betadine [Povidone Iodine] Other (See Comments)    Unknown reaction per caregiver  .  Ciprofloxacin Other (See Comments)    Unknown reaction per caregiver  . Morphine Other (See Comments)    Unknown reaction per caregiver    HOME MEDICATIONS: Outpatient Medications Prior to Visit  Medication Sig Dispense Refill  . acetaminophen (TYLENOL) 325 MG tablet Take 650 mg by mouth every 6 (six) hours as needed. For pain    . baclofen (LIORESAL) 20 MG tablet Take 1 tablet (20 mg total) by mouth 4 (four) times daily. 360 each 3  . calcium carbonate (TUMS - DOSED IN MG ELEMENTAL CALCIUM) 500 MG chewable tablet Chew 1 tablet by mouth 3 (three) times daily.    . cetirizine (ZYRTEC) 10 MG tablet Take 10 mg by mouth daily.    . cholecalciferol (VITAMIN D) 1000 UNITS tablet Take 1,000 Units by mouth 2 (two) times daily.    . cyclobenzaprine (FLEXERIL) 10 MG tablet 1 at 8am and 12pm and 2 tabs at 8pm. 360 tablet 3  . Dextromethorphan Polistirex (DELSYM PO) Take by mouth. Takes 38ml po BID prn cough    . diazepam (VALIUM) 2 MG tablet Take 1 tablet (2 mg total) by mouth 2 (two) times daily. Must last 30 days. 60 tablet 5  . diphenhydrAMINE (BENADRYL) 25 MG tablet Take 25 mg by mouth every 6 (six) hours as needed for allergies.    . fluticasone (VERAMYST) 27.5 MCG/SPRAY nasal spray Place 1 spray into the nose at bedtime as needed for rhinitis.    Marland Kitchen glycerin adult (GLYCERIN ADULT) 2 G SUPP Place 1 suppository rectally every other day.    . hydrochlorothiazide (HYDRODIURIL) 25 MG tablet Take 25 mg by mouth daily.    Marland Kitchen loperamide (IMODIUM) 2 MG capsule Take 2 mg by mouth 3 (three) times daily as needed for diarrhea or loose stools. Reported on 05/11/2015    . metoprolol (TOPROL-XL) 100 MG 24 hr tablet Take 100 mg by mouth daily.     . mineral oil enema Place 1 enema rectally as needed for severe constipation.    . Multiple Vitamin (MULITIVITAMIN WITH MINERALS) TABS Take 1 tablet by mouth daily.    . nitrofurantoin (MACRODANTIN) 50 MG capsule Take 1 capsule (50 mg total) by mouth daily.    Marland Kitchen  senna-docusate (SENOKOT-S) 8.6-50 MG tablet Take 3 tablets by mouth at bedtime.    . tizanidine (ZANAFLEX) 2 MG capsule Take 1 capsule (2 mg total) by mouth 3 (three) times daily. 270 capsule 3  . traMADol (ULTRAM) 50 MG tablet Take 50 mg by mouth every 6 (six) hours as needed for moderate pain.     Marland Kitchen warfarin (COUMADIN) 3 MG tablet Take 3 mg by mouth daily at 6 PM.     . warfarin (COUMADIN) 1 MG tablet Take by mouth as directed. Takes 0.5mg  Tues, Thurs, Sat, Sun. Along with 3 mg.     No facility-administered medications prior to visit.     PAST MEDICAL HISTORY: Past Medical History:  Diagnosis Date  . Cerebral palsy ([place])   . Chronic constipation   . Coagulopathy ([place])   . Hypertension   . Insomnia   . Recurrent UTI   . Tachycardia     PAST SURGICAL HISTORY: Past Surgical History:  Procedure Laterality Date  . ELBOW SURGERY  09/12/10   left  .  INCISE AND DRAIN ABCESS  09/11/10  . SMALL INTESTINE SURGERY    . TOOTH EXTRACTION  11/22/2011   Procedure: DENTAL RESTORATION/EXTRACTIONS;  Surgeon: Marcelo Baldy, MD;  Location: [facility];  Service: Dentistry;  Laterality: N/A;  dental restoration and cleaning; NO EXTRACTIONS    FAMILY HISTORY: Family History  Family history unknown: Yes    SOCIAL HISTORY: Social History   Socioeconomic History  . Marital status: Single    Spouse name: Not on file  . Number of children: Not on file  . Years of education: Not on file  . Highest education level: Not on file  Occupational History  . Not on file  Social Needs  . Financial resource strain: Not on file  . Food insecurity:    Worry: Not on file    Inability: Not on file  . Transportation needs:    Medical: Not on file    Non-medical: Not on file  Tobacco Use  . Smoking status: Never Smoker  . Smokeless tobacco: Never Used  Substance and Sexual Activity  . Alcohol use: No  . Drug use: No  . Sexual activity: Never  Lifestyle  . Physical activity:    Days per week: Not on file      Minutes per session: Not on file  . Stress: Not on file  Relationships  . Social connections:    Talks on phone: Not on file    Gets together: Not on file    Attends religious service: Not on file    Active member of club or organization: Not on file    Attends meetings of clubs or organizations: Not on file    Relationship status: Not on file  . Intimate partner violence:    Fear of current or ex partner: Not on file    Emotionally abused: Not on file    Physically abused: Not on file    Forced sexual activity: Not on file  Other Topics Concern  . Not on file  Social History Narrative  . Not on file     PHYSICAL EXAM  Vitals:   07/22/17 0850  BP: 117/79  Pulse: 96  Height: 5\' 10"  (1.778 m)   Body mass index is 28.27 kg/m.  Generalized: Well developed, in no acute distress  Head: normocephalic and atraumatic,. Oropharynx benign  Neck: Supple,   Musculoskeletal: No deformity   Neurological examination   Mentation: Alert Slow spastic dysarthria, follows most commands  Cranial nerve II-XII: Pupils were equal round reactive to light extraocular movements were full, visual field were full on confrontational test. Facial sensation and strength were normal. hearing was intact to finger rubbing bilaterally. Uvula tongue midline. head turning and shoulder shrug were normal and symmetric.He has slow spastic tongue movement  Motor: Profound spasticity of bilateral upper and lower extremities, right arm has relatively frequent movement tends to stay in right shoulder extension elbow extension wrist finger flexion and profound left upper extremity spasticity with left shoulder extension elbow flexion and wrist and finger flexion. Trace movement of left but bilateral knee contractions limbs are held in place by straps. Sensory: normal and symmetric to light touch, pinprick, and  Vibration, in the upper and lower extremities Coordination: Unable to perform Reflexes: Hypoactive and  symmetric  Gait and Station: Deferred  DIAGNOSTIC DATA (LABS, IMAGING, TESTING) - I reviewed patient records, labs, notes, testing and imaging myself where available.  Lab Results  Component Value Date   WBC 6.8 11/03/2015   HGB 14.3 11/03/2015  HCT 43.8 11/03/2015   MCV 90.1 11/03/2015   PLT 219 11/03/2015      Component Value Date/Time   NA 142 11/03/2015 1215   K 4.4 11/03/2015 1215   CL 103 11/03/2015 1215   CO2 31 11/03/2015 1215   GLUCOSE 89 11/03/2015 1215   BUN 10 11/03/2015 1215   CREATININE 0.97 11/03/2015 1215   CREATININE 1.00 07/26/2015 1426   CALCIUM 9.4 11/03/2015 1215   PROT 6.5 11/03/2015 1215   ALBUMIN 3.7 11/03/2015 1215   AST 23 11/03/2015 1215   ALT 22 11/03/2015 1215   ALKPHOS 54 11/03/2015 1215   BILITOT 0.8 11/03/2015 1215   GFRNONAA >60 11/03/2015 1215   GFRNONAA >89 07/26/2015 1426   GFRAA >60 11/03/2015 1215   GFRAA >89 07/26/2015 1426   Lab Results  Component Value Date   CHOL  09/30/2008    87        ATP III CLASSIFICATION:  <200     mg/dL   Desirable  200-239  mg/dL   Borderline High  >=240    mg/dL   High          TRIG 50 09/30/2008    Lab Results  Component Value Date   TSH 0.225 (L) 06/03/2011      ASSESSMENT AND PLAN  37 y.o. 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

## 2017-07-22 ENCOUNTER — Encounter: Payer: Self-pay | Admitting: Nurse Practitioner

## 2017-07-22 ENCOUNTER — Ambulatory Visit (INDEPENDENT_AMBULATORY_CARE_PROVIDER_SITE_OTHER): Payer: Medicare Other | Admitting: Nurse Practitioner

## 2017-07-22 VITALS — BP 117/79 | HR 96 | Ht 70.0 in

## 2017-07-22 DIAGNOSIS — G809 Cerebral palsy, unspecified: Secondary | ICD-10-CM | POA: Diagnosis not present

## 2017-07-22 DIAGNOSIS — R252 Cramp and spasm: Secondary | ICD-10-CM

## 2017-07-22 DIAGNOSIS — Z993 Dependence on wheelchair: Secondary | ICD-10-CM | POA: Insufficient documentation

## 2017-07-22 MED ORDER — TIZANIDINE HCL 2 MG PO CAPS: 2.0000 mg | ORAL_CAPSULE | Freq: Three times a day (TID) | ORAL | 3 refills | Status: DC

## 2017-07-22 MED ORDER — DIAZEPAM 2 MG PO TABS: 2.0000 mg | ORAL_TABLET | Freq: Two times a day (BID) | ORAL | 5 refills | Status: DC

## 2017-07-22 MED ORDER — CYCLOBENZAPRINE HCL 10 MG PO TABS: ORAL_TABLET | ORAL | 3 refills | Status: DC

## 2017-07-22 MED ORDER — BACLOFEN 20 MG PO TABS: 20.0000 mg | ORAL_TABLET | Freq: Four times a day (QID) | ORAL | 3 refills | Status: DC

## 2017-07-22 NOTE — Patient Instructions (Signed)
Continue Flexeril 10 mg   at current dose    does not need refills    Continue Valium 2 mg twice daily    Continue Zanaflex 2 mg 3 times daily    Baclofen 20 mg 3 times a day Follow-up yearly and when necessary

## 2017-07-28 NOTE — Progress Notes (Signed)
I have reviewed and agreed above plan. 

## 2017-12-14 ENCOUNTER — Emergency Department (HOSPITAL_COMMUNITY): Payer: Medicare Other

## 2017-12-14 ENCOUNTER — Inpatient Hospital Stay (HOSPITAL_COMMUNITY)
Admission: EM | Admit: 2017-12-14 | Discharge: 2017-12-28 | DRG: 335 | Disposition: A | Discharge status: Home / Self Care | Payer: Medicare Other | Attending: Internal Medicine | Admitting: Internal Medicine

## 2017-12-14 ENCOUNTER — Encounter (HOSPITAL_COMMUNITY): Payer: Self-pay

## 2017-12-14 ENCOUNTER — Other Ambulatory Visit: Payer: Self-pay

## 2017-12-14 DIAGNOSIS — K660 Peritoneal adhesions (postprocedural) (postinfection): Secondary | ICD-10-CM | POA: Diagnosis present

## 2017-12-14 DIAGNOSIS — J95821 Acute postprocedural respiratory failure: Secondary | ICD-10-CM | POA: Diagnosis not present

## 2017-12-14 DIAGNOSIS — N319 Neuromuscular dysfunction of bladder, unspecified: Secondary | ICD-10-CM | POA: Diagnosis present

## 2017-12-14 DIAGNOSIS — K56609 Unspecified intestinal obstruction, unspecified as to partial versus complete obstruction: Principal | ICD-10-CM | POA: Diagnosis present

## 2017-12-14 DIAGNOSIS — Z79899 Other long term (current) drug therapy: Secondary | ICD-10-CM | POA: Diagnosis not present

## 2017-12-14 DIAGNOSIS — Z885 Allergy status to narcotic agent status: Secondary | ICD-10-CM | POA: Diagnosis not present

## 2017-12-14 DIAGNOSIS — Z87440 Personal history of urinary (tract) infections: Secondary | ICD-10-CM | POA: Diagnosis not present

## 2017-12-14 DIAGNOSIS — G9341 Metabolic encephalopathy: Secondary | ICD-10-CM | POA: Diagnosis not present

## 2017-12-14 DIAGNOSIS — E46 Unspecified protein-calorie malnutrition: Secondary | ICD-10-CM | POA: Diagnosis present

## 2017-12-14 DIAGNOSIS — Z888 Allergy status to other drugs, medicaments and biological substances status: Secondary | ICD-10-CM | POA: Diagnosis not present

## 2017-12-14 DIAGNOSIS — R609 Edema, unspecified: Secondary | ICD-10-CM | POA: Diagnosis not present

## 2017-12-14 DIAGNOSIS — Z7901 Long term (current) use of anticoagulants: Secondary | ICD-10-CM

## 2017-12-14 DIAGNOSIS — Z4659 Encounter for fitting and adjustment of other gastrointestinal appliance and device: Secondary | ICD-10-CM

## 2017-12-14 DIAGNOSIS — G809 Cerebral palsy, unspecified: Secondary | ICD-10-CM | POA: Diagnosis present

## 2017-12-14 DIAGNOSIS — R252 Cramp and spasm: Secondary | ICD-10-CM | POA: Diagnosis present

## 2017-12-14 DIAGNOSIS — N35911 Unspecified urethral stricture, male, meatal: Secondary | ICD-10-CM | POA: Diagnosis present

## 2017-12-14 DIAGNOSIS — M62838 Other muscle spasm: Secondary | ICD-10-CM | POA: Diagnosis not present

## 2017-12-14 DIAGNOSIS — G9340 Encephalopathy, unspecified: Secondary | ICD-10-CM

## 2017-12-14 DIAGNOSIS — J9690 Respiratory failure, unspecified, unspecified whether with hypoxia or hypercapnia: Secondary | ICD-10-CM

## 2017-12-14 DIAGNOSIS — R1310 Dysphagia, unspecified: Secondary | ICD-10-CM | POA: Diagnosis not present

## 2017-12-14 DIAGNOSIS — J9600 Acute respiratory failure, unspecified whether with hypoxia or hypercapnia: Secondary | ICD-10-CM | POA: Diagnosis not present

## 2017-12-14 DIAGNOSIS — E876 Hypokalemia: Secondary | ICD-10-CM | POA: Diagnosis present

## 2017-12-14 DIAGNOSIS — B965 Pseudomonas (aeruginosa) (mallei) (pseudomallei) as the cause of diseases classified elsewhere: Secondary | ICD-10-CM | POA: Diagnosis present

## 2017-12-14 DIAGNOSIS — Z95828 Presence of other vascular implants and grafts: Secondary | ICD-10-CM

## 2017-12-14 DIAGNOSIS — K567 Ileus, unspecified: Secondary | ICD-10-CM | POA: Diagnosis not present

## 2017-12-14 DIAGNOSIS — R451 Restlessness and agitation: Secondary | ICD-10-CM | POA: Diagnosis not present

## 2017-12-14 DIAGNOSIS — E872 Acidosis, unspecified: Secondary | ICD-10-CM

## 2017-12-14 DIAGNOSIS — Z881 Allergy status to other antibiotic agents status: Secondary | ICD-10-CM

## 2017-12-14 DIAGNOSIS — I10 Essential (primary) hypertension: Secondary | ICD-10-CM | POA: Diagnosis present

## 2017-12-14 DIAGNOSIS — I480 Paroxysmal atrial fibrillation: Secondary | ICD-10-CM | POA: Diagnosis present

## 2017-12-14 DIAGNOSIS — Z9911 Dependence on respirator [ventilator] status: Secondary | ICD-10-CM

## 2017-12-14 DIAGNOSIS — K5909 Other constipation: Secondary | ICD-10-CM | POA: Diagnosis present

## 2017-12-14 DIAGNOSIS — R000 Tachycardia, unspecified: Secondary | ICD-10-CM | POA: Diagnosis present

## 2017-12-14 DIAGNOSIS — Z0189 Encounter for other specified special examinations: Secondary | ICD-10-CM

## 2017-12-14 DIAGNOSIS — M7989 Other specified soft tissue disorders: Secondary | ICD-10-CM | POA: Diagnosis not present

## 2017-12-14 DIAGNOSIS — Z86718 Personal history of other venous thrombosis and embolism: Secondary | ICD-10-CM

## 2017-12-14 DIAGNOSIS — R066 Hiccough: Secondary | ICD-10-CM | POA: Diagnosis not present

## 2017-12-14 DIAGNOSIS — K3184 Gastroparesis: Secondary | ICD-10-CM | POA: Diagnosis present

## 2017-12-14 DIAGNOSIS — R569 Unspecified convulsions: Secondary | ICD-10-CM | POA: Diagnosis not present

## 2017-12-14 DIAGNOSIS — L8990 Pressure ulcer of unspecified site, unspecified stage: Secondary | ICD-10-CM

## 2017-12-14 DIAGNOSIS — Z978 Presence of other specified devices: Secondary | ICD-10-CM

## 2017-12-14 DIAGNOSIS — Z9289 Personal history of other medical treatment: Secondary | ICD-10-CM

## 2017-12-14 DIAGNOSIS — I4891 Unspecified atrial fibrillation: Secondary | ICD-10-CM | POA: Diagnosis not present

## 2017-12-14 DIAGNOSIS — N390 Urinary tract infection, site not specified: Secondary | ICD-10-CM | POA: Diagnosis present

## 2017-12-14 DIAGNOSIS — J9601 Acute respiratory failure with hypoxia: Secondary | ICD-10-CM | POA: Diagnosis not present

## 2017-12-14 LAB — URINALYSIS, ROUTINE W REFLEX MICROSCOPIC
BILIRUBIN URINE: NEGATIVE
Bacteria, UA: NONE SEEN
Glucose, UA: NEGATIVE mg/dL
HGB URINE DIPSTICK: NEGATIVE
Ketones, ur: 20 mg/dL — AB
LEUKOCYTES UA: NEGATIVE
Nitrite: NEGATIVE
PH: 5 (ref 5.0–8.0)
Protein, ur: 100 mg/dL — AB

## 2017-12-14 LAB — COMPREHENSIVE METABOLIC PANEL
ALBUMIN: 3.9 g/dL (ref 3.5–5.0)
ALT: 28 U/L (ref 0–44)
ANION GAP: 12 (ref 5–15)
AST: 35 U/L (ref 15–41)
Alkaline Phosphatase: 65 U/L (ref 38–126)
BILIRUBIN TOTAL: 1.4 mg/dL — AB (ref 0.3–1.2)
BUN: 16 mg/dL (ref 6–20)
CO2: 30 mmol/L (ref 22–32)
Calcium: 9.6 mg/dL (ref 8.9–10.3)
Chloride: 98 mmol/L (ref 98–111)
Creatinine, Ser: 1.14 mg/dL (ref 0.61–1.24)
GFR calc Af Amer: >60 mL/min (ref >60)
GFR calc non Af Amer: >60 mL/min (ref >60)
GLUCOSE: 153 mg/dL — AB (ref 70–99)
POTASSIUM: 3.4 mmol/L — AB (ref 3.5–5.1)
SODIUM: 140 mmol/L (ref 135–145)
TOTAL PROTEIN: 7.3 g/dL (ref 6.5–8.1)

## 2017-12-14 LAB — I-STAT CG4 LACTIC ACID, ED
Lactic Acid, Venous: 6.5 mmol/L (ref 0.5–1.9)
Lactic Acid, Venous: 5.92 mmol/L (ref 0.5–1.9)
Lactic Acid, Venous: 3.01 mmol/L (ref 0.5–1.9)

## 2017-12-14 LAB — I-STAT ARTERIAL BLOOD GAS, ED
ACID-BASE EXCESS: 1 mmol/L (ref 0.0–2.0)
BICARBONATE: 26.4 mmol/L (ref 20.0–28.0)
O2 Saturation: 90 %
PCO2 ART: 40.3 mmHg (ref 32.0–48.0)
PH ART: 7.42 (ref 7.350–7.450)
PO2 ART: 55 mmHg — AB (ref 83.0–108.0)
Patient temperature: 97.1
TCO2: 28 mmol/L (ref 22–32)

## 2017-12-14 LAB — CBC WITH DIFFERENTIAL/PLATELET
ABS IMMATURE GRANULOCYTES: 0.03 10*3/uL (ref 0.00–0.07)
BASOS PCT: 0 %
Basophils Absolute: 0 10*3/uL (ref 0.0–0.1)
Eosinophils Absolute: 0 10*3/uL (ref 0.0–0.5)
Eosinophils Relative: 0 %
HCT: 48 % (ref 39.0–52.0)
Hemoglobin: 15.5 g/dL (ref 13.0–17.0)
IMMATURE GRANULOCYTES: 0 %
LYMPHS PCT: 2 %
Lymphs Abs: 0.3 10*3/uL — ABNORMAL LOW (ref 0.7–4.0)
MCH: 29.2 pg (ref 26.0–34.0)
MCHC: 32.3 g/dL (ref 30.0–36.0)
MCV: 90.4 fL (ref 80.0–100.0)
MONOS PCT: 8 %
Monocytes Absolute: 1.1 10*3/uL — ABNORMAL HIGH (ref 0.1–1.0)
NEUTROS ABS: 11.9 10*3/uL — AB (ref 1.7–7.7)
NEUTROS PCT: 90 %
PLATELETS: 241 10*3/uL (ref 150–400)
RBC: 5.31 MIL/uL (ref 4.22–5.81)
RDW: 13.9 % (ref 11.5–15.5)
WBC: 13.3 10*3/uL — ABNORMAL HIGH (ref 4.0–10.5)
nRBC: 0 % (ref 0.0–0.2)

## 2017-12-14 LAB — MAGNESIUM: Magnesium: 2 mg/dL (ref 1.7–2.4)

## 2017-12-14 LAB — PROTIME-INR
INR: 2.35
Prothrombin Time: 25.4 seconds — ABNORMAL HIGH (ref 11.4–15.2)

## 2017-12-14 LAB — LIPASE, BLOOD: Lipase: 23 U/L (ref 11–51)

## 2017-12-14 MED ORDER — LACTATED RINGERS IV BOLUS
1000.0000 mL | Freq: Once | INTRAVENOUS | Status: AC
  Administered 2017-12-14: 1000 mL via INTRAVENOUS

## 2017-12-14 MED ORDER — VANCOMYCIN HCL 10 G IV SOLR
1750.0000 mg | Freq: Once | INTRAVENOUS | Status: AC
  Administered 2017-12-14: 1750 mg via INTRAVENOUS
  Filled 2017-12-14: qty 1750

## 2017-12-14 MED ORDER — METRONIDAZOLE IN NACL 5-0.79 MG/ML-% IV SOLN
500.0000 mg | Freq: Three times a day (TID) | INTRAVENOUS | Status: DC
  Administered 2017-12-14: 500 mg via INTRAVENOUS
  Filled 2017-12-14 (×3): qty 100

## 2017-12-14 MED ORDER — LACTATED RINGERS IV BOLUS
2000.0000 mL | Freq: Once | INTRAVENOUS | Status: AC
  Administered 2017-12-14: 2000 mL via INTRAVENOUS

## 2017-12-14 MED ORDER — PROPOFOL 10 MG/ML IV BOLUS
INTRAVENOUS | Status: AC
  Filled 2017-12-14: qty 20

## 2017-12-14 MED ORDER — SODIUM CHLORIDE 0.9 % IV SOLN: INTRAVENOUS | Status: DC

## 2017-12-14 MED ORDER — FAMOTIDINE IN NACL 20-0.9 MG/50ML-% IV SOLN
20.0000 mg | Freq: Two times a day (BID) | INTRAVENOUS | Status: DC
  Administered 2017-12-15: 20 mg via INTRAVENOUS
  Filled 2017-12-14: qty 50

## 2017-12-14 MED ORDER — METHOCARBAMOL 1000 MG/10ML IJ SOLN
500.0000 mg | Freq: Three times a day (TID) | INTRAVENOUS | Status: DC
  Administered 2017-12-15 – 2017-12-17 (×7): 500 mg via INTRAVENOUS
  Filled 2017-12-14 (×2): qty 5
  Filled 2017-12-14: qty 500
  Filled 2017-12-14 (×2): qty 5
  Filled 2017-12-14: qty 500
  Filled 2017-12-14 (×4): qty 5

## 2017-12-14 MED ORDER — MIDAZOLAM HCL 2 MG/2ML IJ SOLN
INTRAMUSCULAR | Status: AC
  Filled 2017-12-14: qty 2

## 2017-12-14 MED ORDER — LORAZEPAM 2 MG/ML IJ SOLN
1.0000 mg | Freq: Once | INTRAMUSCULAR | Status: AC
  Administered 2017-12-14: 1 mg via INTRAVENOUS
  Filled 2017-12-14: qty 1

## 2017-12-14 MED ORDER — ONDANSETRON HCL 4 MG/2ML IJ SOLN
4.0000 mg | Freq: Four times a day (QID) | INTRAMUSCULAR | Status: DC | PRN
  Administered 2017-12-24 – 2017-12-25 (×2): 4 mg via INTRAVENOUS
  Filled 2017-12-14 (×2): qty 2

## 2017-12-14 MED ORDER — MIDAZOLAM HCL 2 MG/2ML IJ SOLN: 1.0000 mg | Freq: Once | INTRAMUSCULAR | Status: DC

## 2017-12-14 MED ORDER — METOPROLOL TARTRATE 5 MG/5ML IV SOLN: 5.0000 mg | Freq: Four times a day (QID) | INTRAVENOUS | Status: DC

## 2017-12-14 MED ORDER — SODIUM CHLORIDE 0.9 % IV SOLN
2.0000 g | Freq: Once | INTRAVENOUS | Status: DC
  Filled 2017-12-14: qty 2

## 2017-12-14 MED ORDER — FENTANYL CITRATE (PF) 250 MCG/5ML IJ SOLN
INTRAMUSCULAR | Status: AC
  Filled 2017-12-14: qty 5

## 2017-12-14 MED ORDER — VANCOMYCIN HCL IN DEXTROSE 1-5 GM/200ML-% IV SOLN
1000.0000 mg | Freq: Three times a day (TID) | INTRAVENOUS | Status: DC
  Administered 2017-12-15: 1000 mg via INTRAVENOUS
  Filled 2017-12-14: qty 200

## 2017-12-14 MED ORDER — ONDANSETRON HCL 4 MG/2ML IJ SOLN
4.0000 mg | Freq: Once | INTRAMUSCULAR | Status: AC
  Administered 2017-12-14: 4 mg via INTRAVENOUS
  Filled 2017-12-14: qty 2

## 2017-12-14 MED ORDER — VITAMIN K1 10 MG/ML IJ SOLN
10.0000 mg | INTRAVENOUS | Status: AC
  Administered 2017-12-14: 10 mg via INTRAVENOUS
  Filled 2017-12-14: qty 1

## 2017-12-14 MED ORDER — FLEET ENEMA 7-19 GM/118ML RE ENEM: 1.0000 | ENEMA | Freq: Every day | RECTAL | Status: DC | PRN

## 2017-12-14 MED ORDER — SODIUM CHLORIDE 0.9 % IV BOLUS: 1000.0000 mL | Freq: Once | INTRAVENOUS | Status: AC

## 2017-12-14 MED ORDER — KCL IN DEXTROSE-NACL 20-5-0.45 MEQ/L-%-% IV SOLN
INTRAVENOUS | Status: DC
  Administered 2017-12-15: 04:00:00 via INTRAVENOUS
  Filled 2017-12-14 (×2): qty 1000

## 2017-12-14 MED ORDER — SODIUM CHLORIDE 0.9 % IV SOLN
2.0000 g | Freq: Three times a day (TID) | INTRAVENOUS | Status: DC
  Administered 2017-12-14: 2 g via INTRAVENOUS
  Filled 2017-12-14 (×2): qty 2

## 2017-12-14 MED ORDER — IOPAMIDOL (ISOVUE-300) INJECTION 61%: 100.0000 mL | Freq: Once | INTRAVENOUS | Status: DC | PRN

## 2017-12-14 MED ORDER — IOHEXOL 300 MG/ML  SOLN
100.0000 mL | Freq: Once | INTRAMUSCULAR | Status: AC | PRN
  Administered 2017-12-14: 100 mL via INTRAVENOUS

## 2017-12-14 MED ORDER — SORBITOL 70 % SOLN
960.0000 mL | TOPICAL_OIL | Freq: Once | ORAL | Status: DC
  Filled 2017-12-14: qty 473

## 2017-12-14 MED ORDER — LORAZEPAM 2 MG/ML IJ SOLN
0.5000 mg | Freq: Four times a day (QID) | INTRAMUSCULAR | Status: DC | PRN
  Administered 2017-12-15 – 2017-12-25 (×6): 0.5 mg via INTRAVENOUS
  Filled 2017-12-14 (×7): qty 1

## 2017-12-14 MED ORDER — METOCLOPRAMIDE HCL 5 MG/ML IJ SOLN
10.0000 mg | Freq: Once | INTRAMUSCULAR | Status: AC
  Administered 2017-12-14: 10 mg via INTRAVENOUS
  Filled 2017-12-14: qty 2

## 2017-12-14 MED ORDER — PROTHROMBIN COMPLEX CONC HUMAN 500 UNITS IV KIT
2200.0000 [IU] | PACK | Status: AC
  Administered 2017-12-14: 2200 [IU] via INTRAVENOUS
  Filled 2017-12-14: qty 2200

## 2017-12-14 MED ORDER — VANCOMYCIN HCL IN DEXTROSE 1-5 GM/200ML-% IV SOLN
1000.0000 mg | Freq: Once | INTRAVENOUS | Status: DC
  Filled 2017-12-14: qty 200

## 2017-12-14 NOTE — Consult Note (Addendum)
Reason for Consult: abdominal pain Referring Physician: Hanley, [name] is an 37 y.o. male.  HPI: 37 yo male with CP and distant history of small bowel resection presents with abdominal pain and vomiting for one day. He woke up with abdominal pain and it worsened throughout the day. He has had bowel obstructions in the past. He takes coumadin for history of DVTs and has been taking that this week. Pain is diffuse in his abdomen  Past Medical History:  Diagnosis Date  . Cerebral palsy ([place])   . Chronic constipation   . Coagulopathy ([place])   . Hypertension   . Insomnia   . Recurrent UTI   . Tachycardia     Past Surgical History:  Procedure Laterality Date  . ELBOW SURGERY  09/12/10   left  . INCISE AND DRAIN ABCESS  09/11/10  . SMALL INTESTINE SURGERY    . TOOTH EXTRACTION  11/22/2011   Procedure: DENTAL RESTORATION/EXTRACTIONS;  Surgeon: Michael Baldy, MD;  Location: [facility];  Service: Dentistry;  Laterality: N/A;  dental restoration and cleaning; NO EXTRACTIONS    Family History  Family history unknown: Yes    Social History:  reports that he has never smoked. He has never used smokeless tobacco. He reports that he does not drink alcohol or use drugs.  Allergies:  Allergies  Allergen Reactions  . Betadine [Povidone Iodine] Other (See Comments)    Unknown reaction per caregiver  . Ciprofloxacin Other (See Comments)    Unknown reaction per caregiver  . Morphine Other (See Comments)    Unknown reaction per caregiver    Medications: I have reviewed the patient's current medications.  Results for orders placed or performed during the hospital encounter of 12/14/17 (from the past 48 hour(s))  Comprehensive metabolic panel     Status: Abnormal   Collection Time: 12/14/17  5:27 PM  Result Value Ref Range   Sodium 140 135 - 145 mmol/L   Potassium 3.4 (L) 3.5 - 5.1 mmol/L   Chloride 98 98 - 111 mmol/L   CO2 30 22 - 32 mmol/L   Glucose, Bld 153 (H) 70 - 99 mg/dL   BUN  16 6 - 20 mg/dL   Creatinine, Ser 1.14 0.61 - 1.24 mg/dL   Calcium 9.6 8.9 - 10.3 mg/dL   Total Protein 7.3 6.5 - 8.1 g/dL   Albumin 3.9 3.5 - 5.0 g/dL   AST 35 15 - 41 U/L   ALT 28 0 - 44 U/L   Alkaline Phosphatase 65 38 - 126 U/L   Total Bilirubin 1.4 (H) 0.3 - 1.2 mg/dL   GFR calc non Af Amer >60 >60 mL/min   GFR calc Af Amer >60 >60 mL/min    Comment: (NOTE) The eGFR has been calculated using the CKD EPI equation. This calculation has not been validated in all clinical situations. eGFR's persistently <60 mL/min signify possible Chronic Kidney Disease.    Anion gap 12 5 - 15    Comment: Performed at [facility] [address], [place], [place] [address]  CBC WITH DIFFERENTIAL     Status: Abnormal   Collection Time: 12/14/17  5:27 PM  Result Value Ref Range   WBC 13.3 (H) 4.0 - 10.5 K/uL   RBC 5.31 4.22 - 5.81 MIL/uL   Hemoglobin 15.5 13.0 - 17.0 g/dL   HCT 48.0 39.0 - 52.0 %   MCV 90.4 80.0 - 100.0 fL   MCH 29.2 26.0 - 34.0 pg  MCHC 32.3 30.0 - 36.0 g/dL   RDW 13.9 11.5 - 15.5 %   Platelets 241 150 - 400 K/uL   nRBC 0.0 0.0 - 0.2 %   Neutrophils Relative % 90 %   Neutro Abs 11.9 (H) 1.7 - 7.7 K/uL   Lymphocytes Relative 2 %   Lymphs Abs 0.3 (L) 0.7 - 4.0 K/uL   Monocytes Relative 8 %   Monocytes Absolute 1.1 (H) 0.1 - 1.0 K/uL   Eosinophils Relative 0 %   Eosinophils Absolute 0.0 0.0 - 0.5 K/uL   Basophils Relative 0 %   Basophils Absolute 0.0 0.0 - 0.1 K/uL   Immature Granulocytes 0 %   Abs Immature Granulocytes 0.03 0.00 - 0.07 K/uL    Comment: Performed at [facility] [address], [place], [place] [address]  Lipase, blood     Status: None   Collection Time: 12/14/17  5:27 PM  Result Value Ref Range   Lipase 23 11 - 51 U/L    Comment: Performed at [facility] [address], [place], [place] [address]  Protime-INR     Status: Abnormal   Collection Time: 12/14/17  5:27 PM  Result Value Ref Range   Prothrombin Time 25.4 (H) 11.4 -  15.2 seconds   INR 2.35     Comment: Performed at [facility] [address], [place], [place] [address]  I-Stat CG4 Lactic Acid, ED     Status: Abnormal   Collection Time: 12/14/17  5:36 PM  Result Value Ref Range   Lactic Acid, Venous 6.50 (HH) 0.5 - 1.9 mmol/L   Comment NOTIFIED PHYSICIAN   I-Stat CG4 Lactic Acid, ED     Status: Abnormal   Collection Time: 12/14/17  7:45 PM  Result Value Ref Range   Lactic Acid, Venous 5.92 (HH) 0.5 - 1.9 mmol/L   Comment NOTIFIED PHYSICIAN   Urinalysis, Routine w reflex microscopic     Status: Abnormal   Collection Time: 12/14/17  7:48 PM  Result Value Ref Range   Color, Urine AMBER (A) YELLOW    Comment: BIOCHEMICALS MAY BE AFFECTED BY COLOR   APPearance CLEAR CLEAR   Specific Gravity, Urine >1.046 (H) 1.005 - 1.030   pH 5.0 5.0 - 8.0   Glucose, UA NEGATIVE NEGATIVE mg/dL   Hgb urine dipstick NEGATIVE NEGATIVE   Bilirubin Urine NEGATIVE NEGATIVE   Ketones, ur 20 (A) NEGATIVE mg/dL   Protein, ur 100 (A) NEGATIVE mg/dL   Nitrite NEGATIVE NEGATIVE   Leukocytes, UA NEGATIVE NEGATIVE   RBC / HPF 6-10 0 - 5 RBC/hpf   WBC, UA 6-10 0 - 5 WBC/hpf   Bacteria, UA NONE SEEN NONE SEEN   Squamous Epithelial / LPF 0-5 0 - 5   Mucus PRESENT    Hyaline Casts, UA PRESENT     Comment: Performed at [facility], [address] [place], [place] [address]  I-Stat Arterial Blood Gas, ED - (order at [facility] and [facility] only)     Status: Abnormal   Collection Time: 12/14/17  9:51 PM  Result Value Ref Range   pH, Arterial 7.420 7.350 - 7.450   pCO2 arterial 40.3 32.0 - 48.0 mmHg   pO2, Arterial 55.0 (L) 83.0 - 108.0 mmHg   Bicarbonate 26.4 20.0 - 28.0 mmol/L   TCO2 28 22 - 32 mmol/L   O2 Saturation 90.0 %   Acid-Base Excess 1.0 0.0 - 2.0 mmol/L   Patient temperature 97.1 F  Collection site RADIAL, ALLEN'S TEST ACCEPTABLE    Sample type ARTERIAL     Ct Abdomen Pelvis W Contrast  Result Date: 12/14/2017 CLINICAL DATA:  Acute abdominal pain,  nausea, vomiting. EXAM: CT ABDOMEN AND PELVIS WITH CONTRAST TECHNIQUE: Multidetector CT imaging of the abdomen and pelvis was performed using the standard protocol following bolus administration of intravenous contrast. CONTRAST:  159m OMNIPAQUE IOHEXOL 300 MG/ML  SOLN COMPARISON:  06/07/2011 FINDINGS: Lower chest: Cardiomegaly.  Bibasilar atelectasis.  No effusions. Hepatobiliary: To gallstones within the gallbladder, the largest 1.8 cm. No focal hepatic abnormality. Pancreas: No focal abnormality or ductal dilatation. Spleen: No focal abnormality.  Normal size. Adrenals/Urinary Tract: No adrenal abnormality. No focal renal abnormality. No stones or hydronephrosis. Urinary bladder is unremarkable. Stomach/Bowel: Large stool burden within the rectosigmoid colon. Dilated small bowel loops in the abdomen and pelvis. The distal ileum is decompressed. Findings compatible with distal small bowel obstruction. Exact cause or transition point is not visualized. Stomach is mildly dilated. Vascular/Lymphatic: No evidence of aneurysm or adenopathy. Reproductive: Mildly prominent prostate. Other: No free fluid or free air. Musculoskeletal: No acute bony abnormality. IMPRESSION: Dilated small bowel with air-fluid levels to the distal small bowel compatible with distal small bowel obstruction. Exact cause and transition point not visualized. Large stool burden in the rectosigmoid colon. Mildly prominent prostate. Cholelithiasis. Bibasilar atelectasis. Electronically Signed   By: KRolm BaptiseM.D.   On: 12/14/2017 19:02   Dg Chest Port 1 View  Result Date: 12/14/2017 CLINICAL DATA:  Tachypnea EXAM: PORTABLE CHEST 1 VIEW COMPARISON:  11/14/2011 FINDINGS: Cardiomegaly. Vascular congestion. Very low lung volumes with bibasilar atelectasis. No definite visible effusions or acute bony abnormality. IMPRESSION: Very low lung volumes with cardiomegaly, vascular congestion and bibasilar atelectasis. Electronically Signed   By: KRolm BaptiseM.D.   On: 12/14/2017 18:14    Review of Systems  Unable to perform ROS: Medical condition   Blood pressure 122/73, pulse (!) 138, temperature (!) 97.1 F (36.2 C), resp. rate (!) 31, weight 90.7 kg, SpO2 96 %. Physical Exam  Vitals reviewed. Constitutional: He is oriented to person, place, and time. He appears well-developed and well-nourished.  HENT:  Head: Normocephalic and atraumatic.  Eyes: Pupils are equal, round, and reactive to light. Conjunctivae and EOM are normal.  Neck: Normal range of motion. Neck supple.  Cardiovascular: An irregular rhythm present. Tachycardia present.  Respiratory: Effort normal. Tachypnea noted.  GI: He exhibits distension. There is tenderness. No hernia. Hernia confirmed negative in the ventral area.  Musculoskeletal:  Contracted extremities  Neurological: He is alert and oriented to person, place, and time.  Skin: Skin is warm and dry.  Psychiatric: He has a normal mood and affect. His behavior is normal.    Assessment/Plan: 37yo male with small bowel obstruction and lactic acidosis. He has tachycardia, tachypnea, and leukocytosis. Given these findings, I do not think nonoperative management is a good idea and will plan to proceed with immediate reversal of coumadin and abdominal exploration -continue broad spectrum antibiotics -continue bowel rest -NG tube decompression -plan for ex lap due to lactic acidosis  LArta BruceKinsinger 12/14/2017, 10:39 PM   After initial review of the CT scan I did go over to radiology to review with the in house radiologist as I did not seen definitive decompressed small intestine. The radiologist showed 2 areas of decompressed small intestine consistent with SBO.

## 2017-12-14 NOTE — ED Triage Notes (Signed)
Pt here from home by EMS for evaluation of nausea and vomiting since this AM. Hx of cerebral palsy and neurogenic bladder.  Condom cath in place prior to arrival.  Pt noted to be tachycardic, afebrile, complains of abdominal pain with palpation.

## 2017-12-14 NOTE — ED Notes (Signed)
Admitting Dr informed of lactic acid results 3.01

## 2017-12-14 NOTE — ED Notes (Signed)
Dr Laverta Baltimore informed of lactic acid results 5.92

## 2017-12-14 NOTE — ED Notes (Signed)
I Stat Lac Acid result of 6.50 reported to Dr. Lavell Islam.

## 2017-12-14 NOTE — Progress Notes (Signed)
ANTICOAGULATION CONSULT NOTE - Initial Consult  Pharmacy Consult for heparin Indication: h/o VTE  Allergies  Allergen Reactions  . Betadine [Povidone Iodine] Other (See Comments)    Unknown reaction per caregiver  . Ciprofloxacin Other (See Comments)    Unknown reaction per caregiver  . Morphine Other (See Comments)    Unknown reaction per caregiver    Patient Measurements: Weight: 200 lb (90.7 kg)  Vital Signs: Temp: 97.1 F (36.2 C) (11/17 1641) BP: 122/73 (11/17 2000) Pulse Rate: 138 (11/17 2000)  Labs: Recent Labs    12/14/17 1727  HGB 15.5  HCT 48.0  PLT 241  LABPROT 25.4*  INR 2.35  CREATININE 1.14    Estimated Creatinine Clearance: 99.5 mL/min (by C-G formula based on SCr of 1.14 mg/dL).   Medical History: Past Medical History:  Diagnosis Date  . Cerebral palsy ([place])   . Chronic constipation   . Coagulopathy ([place])   . Hypertension   . Insomnia   . Recurrent UTI   . Tachycardia     Assessment/Plan:  37yo male requiring emergent surgery for ischemic bowel to have INR reversed w/ Eppie Gibson, then to bridge with heparin gtt when appropriate.  Will follow.  Wynona Neat, PharmD, BCPS  12/14/2017,10:52 PM

## 2017-12-14 NOTE — ED Notes (Signed)
IV to R FA appears swollen and red

## 2017-12-14 NOTE — ED Notes (Signed)
IV team at bedside to attempt to obtain 2nd IV site

## 2017-12-14 NOTE — Progress Notes (Signed)
Pharmacy Antibiotic Note  [name] is a 37 y.o. male admitted on 12/14/2017 with sepsis.  Pharmacy has been consulted for Vancomycin and Cefepime dosing.  Plan: Vanc 1750 mg IV x 1 then 1000 mg IV q8hr Cefepime 2 gms IV q8hr Will monitor renal function, cultures and sensitivities Will monitor vancomycin levels as needed  Weight: 200 lb (90.7 kg)  Temp (24hrs), Avg:97.1 F (36.2 C), Min:97.1 F (36.2 C), Max:97.1 F (36.2 C)  Recent Labs  Lab 12/14/17 1727 12/14/17 1736  WBC 13.3*  --   CREATININE 1.14  --   LATICACIDVEN  --  6.50*    Estimated Creatinine Clearance: 99.5 mL/min (by C-G formula based on SCr of 1.14 mg/dL).    Allergies  Allergen Reactions  . Betadine [Povidone Iodine] Other (See Comments)    Unknown reaction per caregiver  . Ciprofloxacin Other (See Comments)    Unknown reaction per caregiver  . Morphine Other (See Comments)    Unknown reaction per caregiver    Antimicrobials this admission: Vanc 11/17 >>  Cefepime 11/17 >>   Thank you for allowing pharmacy to be a part of this patient's care.  Alanda Slim, PharmD, [facility] Clinical Pharmacist Please see AMION for all Pharmacists' Contact Phone Numbers 12/14/2017, 6:41 PM

## 2017-12-14 NOTE — ED Notes (Signed)
Pt to OR in @ 1 hr. Pt to receive Kcentra and Vit K prior to leaving ED

## 2017-12-14 NOTE — ED Provider Notes (Signed)
[facility] EMERGENCY DEPARTMENT Provider Note   CSN: [id number] Arrival date & time: 12/14/17  1629     History   Chief Complaint Chief Complaint  Patient presents with  . Nausea  . Emesis    HPI [name] is a 37 y.o. male.  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  Patient symptoms began early this morning.  Since the onset of his symptoms patient has had multiple episodes of nonbloody emesis. Does report that the emesis was greenish in coloration, but the caregiver believes that this is related to patient eating peas for dinner the night before.  He has had difficulty keeping down both solids and liquids. States that his abdominal pain is been getting progressively worse since the onset.  He is unable to localize one area of pain, but does state that his pain is worst in his lower abdomen.  Patient is also accompanied by his home caregiver who is able to provide more history.  Per the caregiver patient has not had any known fevers, but has felt warm.  Prior to the onset of his nausea, vomiting, abdominal pain he was at his baseline state of health.  When asked to describe the abdominal pain, patient describes it as generalized and achy.  Does not radiate currently.  Is worse with episodes of emesis.  Unsure if related to eating and drinking as he is been unable to eat or drink much today secondary to nausea.  Remaining review of systems as below.   Past Medical History:  Diagnosis Date  . Cerebral palsy ([place])   . Chronic constipation   . Coagulopathy ([place])   . Hypertension   . Insomnia   . Recurrent UTI   . Tachycardia     Patient Active Problem List   Diagnosis  Date Noted  . Lactic acidosis 12/14/2017  . Wheelchair bound 07/22/2017  . Cellulitis of left leg 05/13/2015  . Sepsis ([place]) 05/13/2015  . History of DVT (deep vein thrombosis) 05/13/2015  . Neurogenic bladder 05/13/2015  . Spasticity 12/02/2013  . Small bowel obstruction ([place]) 06/05/2011  . Acute renal failure ([place]) 06/05/2011  . Chronic constipation 06/05/2011  . Hypertension 06/05/2011  . Hypokalemia 06/05/2011  . Low TSH level 06/05/2011  . Groin mass 06/05/2011  . Loose stools 11/15/2010  . Bacteremia 10/16/2010  . MSSA (methicillin susceptible Staphylococcus aureus) septicemia ([place]) 10/16/2010  . Abscess of arm, left 10/16/2010  . CONSTIPATION 11/29/2009  . SHOULDR&UPPR ARM ABRASION/FRICTION BURN INFECTED 01/16/2007  . SORE THROAT 10/30/2006  . Atrial fibrillation ([place]) 08/27/2006  . ANEMIA, PERNICIOUS 07/25/2006  . Infantile cerebral palsy ([place]) 07/25/2006    Past Surgical History:  Procedure Laterality Date  . ELBOW SURGERY  09/12/10   left  . INCISE AND DRAIN ABCESS  09/11/10  . SMALL INTESTINE SURGERY    . TOOTH EXTRACTION  11/22/2011   Procedure: DENTAL RESTORATION/EXTRACTIONS;  Surgeon: Marcelo Baldy, MD;  Location: [facility];  Service: Dentistry;  Laterality: N/A;  dental restoration and cleaning; NO EXTRACTIONS        Home Medications    Prior to Admission medications   Medication Sig Start Date End Date Taking? Authorizing Provider  acetaminophen (TYLENOL) 325 MG tablet Take 650 mg by mouth every 6 (six)  hours as needed. For pain   Yes [provider]  baclofen (LIORESAL) 20 MG tablet Take 1 tablet (20 mg total) by mouth 4 (four) times daily. 07/22/17  Yes Dennie Bible, NP  calcium carbonate (TUMS - DOSED IN MG ELEMENTAL CALCIUM) 500 MG chewable tablet Chew 1 tablet by mouth 3 (three) times daily.   Yes [provider]  cetirizine (ZYRTEC) 10 MG tablet Take 10 mg by mouth daily.   Yes [provider]  cholecalciferol (VITAMIN  D) 1000 UNITS tablet Take 1,000 Units by mouth 2 (two) times daily.   Yes [provider]  cyclobenzaprine (FLEXERIL) 10 MG tablet 1 at 8am and 12pm and 2 tabs at 8pm. 07/22/17  Yes Dennie Bible, NP  diazepam (VALIUM) 2 MG tablet Take 1 tablet (2 mg total) by mouth 2 (two) times daily. Must last 30 days. 07/22/17  Yes Dennie Bible, NP  diphenhydrAMINE (BENADRYL) 25 MG tablet Take 25 mg by mouth every 6 (six) hours as needed for allergies.   Yes [provider]  fluticasone (VERAMYST) 27.5 MCG/SPRAY nasal spray Place 1 spray into the nose at bedtime as needed for rhinitis.   Yes [provider]  hydrochlorothiazide (HYDRODIURIL) 25 MG tablet Take 12.5 mg by mouth daily.    Yes [provider]  loperamide (IMODIUM) 2 MG capsule Take 2 mg by mouth 3 (three) times daily as needed for diarrhea or loose stools. Reported on 05/11/2015   Yes [provider]  metoprolol (TOPROL-XL) 100 MG 24 hr tablet Take 100 mg by mouth daily.    Yes [provider]  mineral oil enema Place 1 enema rectally as needed for severe constipation.   Yes [provider]  Multiple Vitamin (MULITIVITAMIN WITH MINERALS) TABS Take 1 tablet by mouth daily.   Yes [provider]  nitrofurantoin (MACRODANTIN) 50 MG capsule Take 1 capsule (50 mg total) by mouth daily. 05/23/15  Yes Thurnell Lose, MD  senna-docusate (SENOKOT-S) 8.6-50 MG tablet Take 3 tablets by mouth at bedtime.   Yes [provider]  tizanidine (ZANAFLEX) 2 MG capsule Take 1 capsule (2 mg total) by mouth 3 (three) times daily. 07/22/17  Yes Dennie Bible, NP  warfarin (COUMADIN) 3 MG tablet Take 3 mg by mouth daily at 6 PM.    Yes [provider]    Family History Family History  Family history unknown: Yes    Social History Social History   Tobacco Use  . Smoking status: Never Smoker  . Smokeless tobacco: Never Used  Substance Use Topics  .  Alcohol use: No  . Drug use: No     Allergies   Betadine [povidone iodine]; Ciprofloxacin; and Morphine   Review of Systems Review of Systems  Constitutional: Negative for chills and fever.  HENT: Negative for ear pain and sore throat.   Eyes: Negative for pain and visual disturbance.  Respiratory: Negative for cough and shortness of breath.   Cardiovascular: Negative for chest pain and palpitations.  Gastrointestinal: Positive for abdominal pain, nausea and vomiting.  Genitourinary: Negative for dysuria and hematuria.  Musculoskeletal: Negative for arthralgias and back pain.  Skin: Negative for color change and rash.  Neurological: Negative for seizures and syncope.  All other systems reviewed and are negative.    Physical Exam Updated Vital Signs BP 108/66   Pulse (!) 146   Temp (!) 97.1 F (36.2 C)   Resp (!) 36   Wt 90.7 kg  SpO2 96%   BMI 28.70 kg/m   Physical Exam  Constitutional: He appears well-developed and well-nourished.  HENT:  Head: Normocephalic and atraumatic.  Eyes: Conjunctivae are normal.  Neck: Neck supple.  Cardiovascular: Regular rhythm.  Pulmonary/Chest: Breath sounds normal. Tachypnea noted.  Abdominal: Soft. He exhibits distension. There is tenderness.  Midline scar from previous surgery  Genitourinary:  Genitourinary Comments: Condom cath in place  Musculoskeletal: He exhibits no edema.  Neurological: He is alert.  Skin: Skin is warm and dry.  Psychiatric: He has a normal mood and affect.  Nursing note and vitals reviewed.    ED Treatments / Results  Labs (all labs ordered are listed, but only abnormal results are displayed) Labs Reviewed  COMPREHENSIVE METABOLIC PANEL - Abnormal; Notable for the following components:      Result Value   Potassium 3.4 (*)    Glucose, Bld 153 (*)    Total Bilirubin 1.4 (*)    All other components within normal limits  CBC WITH DIFFERENTIAL/PLATELET - Abnormal; Notable for the following  components:   WBC 13.3 (*)    Neutro Abs 11.9 (*)    Lymphs Abs 0.3 (*)    Monocytes Absolute 1.1 (*)    All other components within normal limits  URINALYSIS, ROUTINE W REFLEX MICROSCOPIC - Abnormal; Notable for the following components:   Color, Urine AMBER (*)    Specific Gravity, Urine >1.046 (*)    Ketones, ur 20 (*)    Protein, ur 100 (*)    All other components within normal limits  PROTIME-INR - Abnormal; Notable for the following components:   Prothrombin Time 25.4 (*)    All other components within normal limits  I-STAT CG4 LACTIC ACID, ED - Abnormal; Notable for the following components:   Lactic Acid, Venous 6.50 (*)    All other components within normal limits  I-STAT CG4 LACTIC ACID, ED - Abnormal; Notable for the following components:   Lactic Acid, Venous 5.92 (*)    All other components within normal limits  I-STAT CG4 LACTIC ACID, ED - Abnormal; Notable for the following components:   Lactic Acid, Venous 3.01 (*)    All other components within normal limits  I-STAT ARTERIAL BLOOD GAS, ED - Abnormal; Notable for the following components:   pO2, Arterial 55.0 (*)    All other components within normal limits  CULTURE, BLOOD (ROUTINE X 2)  CULTURE, BLOOD (ROUTINE X 2)  CULTURE, BLOOD (SINGLE)  URINE CULTURE  URINE CULTURE  LIPASE, BLOOD  MAGNESIUM  HIV ANTIBODY (ROUTINE TESTING W REFLEX)  PROCALCITONIN  CBC  BASIC METABOLIC PANEL  MAGNESIUM  PROTIME-INR    EKG EKG Interpretation  Date/Time:  Sunday December 14 2017 16:42:34 EST Ventricular Rate:  133 PR Interval:    QRS Duration: 112 QT Interval:  401 QTC Calculation: 597 R Axis:   -102 Text Interpretation:  Sinus tachycardia Probable left atrial enlargement Incomplete right bundle branch block Probable inferior infarct, recent Probable anterior infarct, age indeterminate Prolonged QT interval No recent tracing. No STEMI.  Confirmed by Nanda Quinton [phone]) on 12/14/2017 4:50:18 PM   Radiology Ct  Abdomen Pelvis W Contrast  Result Date: 12/14/2017 CLINICAL DATA:  Acute abdominal pain, nausea, vomiting. EXAM: CT ABDOMEN AND PELVIS WITH CONTRAST TECHNIQUE: Multidetector CT imaging of the abdomen and pelvis was performed using the standard protocol following bolus administration of intravenous contrast. CONTRAST:  122mL OMNIPAQUE IOHEXOL 300 MG/ML  SOLN COMPARISON:  06/07/2011 FINDINGS: Lower chest: Cardiomegaly.  Bibasilar atelectasis.  No effusions. Hepatobiliary: To gallstones within the gallbladder, the largest 1.8 cm. No focal hepatic abnormality. Pancreas: No focal abnormality or ductal dilatation. Spleen: No focal abnormality.  Normal size. Adrenals/Urinary Tract: No adrenal abnormality. No focal renal abnormality. No stones or hydronephrosis. Urinary bladder is unremarkable. Stomach/Bowel: Large stool burden within the rectosigmoid colon. Dilated small bowel loops in the abdomen and pelvis. The distal ileum is decompressed. Findings compatible with distal small bowel obstruction. Exact cause or transition point is not visualized. Stomach is mildly dilated. Vascular/Lymphatic: No evidence of aneurysm or adenopathy. Reproductive: Mildly prominent prostate. Other: No free fluid or free air. Musculoskeletal: No acute bony abnormality. IMPRESSION: Dilated small bowel with air-fluid levels to the distal small bowel compatible with distal small bowel obstruction. Exact cause and transition point not visualized. Large stool burden in the rectosigmoid colon. Mildly prominent prostate. Cholelithiasis. Bibasilar atelectasis. Electronically Signed   By: Rolm Baptise M.D.   On: 12/14/2017 19:02   Dg Chest Port 1 View  Result Date: 12/14/2017 CLINICAL DATA:  Tachypnea EXAM: PORTABLE CHEST 1 VIEW COMPARISON:  11/14/2011 FINDINGS: Cardiomegaly. Vascular congestion. Very low lung volumes with bibasilar atelectasis. No definite visible effusions or acute bony abnormality. IMPRESSION: Very low lung volumes with  cardiomegaly, vascular congestion and bibasilar atelectasis. Electronically Signed   By: Rolm Baptise M.D.   On: 12/14/2017 18:14    Procedures Procedures (including critical care time)  Medications Ordered in ED Medications  metroNIDAZOLE (FLAGYL) IVPB 500 mg (0 mg Intravenous Stopped 12/14/17 2223)  iopamidol (ISOVUE-300) 61 % injection 100 mL ( Intravenous Canceled Entry 12/14/17 1830)  iopamidol (ISOVUE-300) 61 % injection 100 mL ( Intravenous Canceled Entry 12/14/17 1830)  vancomycin (VANCOCIN) IVPB 1000 mg/200 mL premix (has no administration in time range)  ceFEPIme (MAXIPIME) 2 g in sodium chloride 0.9 % 100 mL IVPB (0 g Intravenous Stopped 12/14/17 2038)  sodium chloride 0.9 % bolus 1,000 mL (has no administration in time range)  dextrose 5 % and 0.45 % NaCl with KCl 20 mEq/L infusion (has no administration in time range)  ondansetron (ZOFRAN) injection 4 mg (has no administration in time range)  famotidine (PEPCID) IVPB 20 mg premix (has no administration in time range)  methocarbamol (ROBAXIN) 500 mg in dextrose 5 % 50 mL IVPB (has no administration in time range)  LORazepam (ATIVAN) injection 0.5 mg (has no administration in time range)  prothrombin complex conc human (KCENTRA) IVPB 2,200 Units (has no administration in time range)  lactated ringers bolus 1,000 mL (0 mLs Intravenous Stopped 12/14/17 1836)  ondansetron (ZOFRAN) injection 4 mg (4 mg Intravenous Given 12/14/17 1736)  LORazepam (ATIVAN) injection 1 mg (1 mg Intravenous Given 12/14/17 1736)  lactated ringers bolus 2,000 mL (0 mLs Intravenous Stopped 12/14/17 2038)  iohexol (OMNIPAQUE) 300 MG/ML solution 100 mL (100 mLs Intravenous Contrast Given 12/14/17 1831)  vancomycin (VANCOCIN) 1,750 mg in sodium chloride 0.9 % 500 mL IVPB (1,750 mg Intravenous New Bag/Given 12/14/17 2035)  metoCLOPramide (REGLAN) injection 10 mg (10 mg Intravenous Given 12/14/17 2103)  phytonadione (VITAMIN K) 10 mg in dextrose 5 % 50 mL IVPB  (10 mg Intravenous New Bag/Given 12/14/17 2306)     Initial Impression / Assessment and Plan / ED Course  I have reviewed the triage vital signs and the nursing notes.  Pertinent labs & imaging results that were available during my care of the patient were reviewed by me and considered in my medical decision making (see chart for details).     Patient is  a 37 year old male with past medical history as detailed above who presents to the emergency department for evaluation of abdominal pain, nausea, and vomiting.  Patient symptoms began this afternoon.  Given patient's significant past medical history as well as his presenting complaint multiple laboratory and imaging studies were obtained.  Patient's labs show a significant lactic acidosis, elevated white blood cell count, and patient's CT of the abdomen and pelvis does show findings concerning for small bowel obstruction.  As a result of patient's tachycardia and tachypnea he was made code sepsis.  Patient given broad-spectrum antibiotics.  Given 3 L of fluid resuscitation secondary to his elevated lactic acid.  NG tube was ordered to be placed due to concern for small bowel obstruction, however the patient and his caregiver declined to allow [place] to place the NG tube on the emergency department.  Given imaging findings, the general surgery physician on-call was consulted regarding patient's care.  Initial recommendation was to admit the patient to the hospital service for medical management of his presumed small bowel obstruction.  Hospital service was consulted and evaluated patient in the emergency department.  Given the fact that patient had continued worsening tachycardia and no significant change in his lactic acid despite fluid resuscitation, hospitalist recommended ICU admission.  The ICU service was contacted and evaluated the patient emergency department.  The hospitalist and intensivist were both concerned for possible ischemic bowel given  patient's presentation and persistently elevated lactic acid despite fluid resuscitation.  As a result the surgeon was asked to reevaluate the patient in the emergency department.  After reevaluation the patient was posted for the OR and will go directly from the emergency department to the OR for further care.  For further information regarding patient's continued hospital course please see admitting physician's documentation.  The care of this patient was discussed with my attending physician Dr. Laverta Baltimore, who voices agreement with work-up and ED disposition.  Final Clinical Impressions(s) / ED Diagnoses   Final diagnoses:  Lactic acidosis  SBO (small bowel obstruction) [name])    ED Discharge Orders    None       Arpi Diebold, Chanda Busing, MD 12/14/17 2338    Margette Fast, MD 12/15/17 [phone]

## 2017-12-14 NOTE — Progress Notes (Signed)
RT gave MD ABG results,  placed pt on 4L [place] at this time. RT will continue to monitor as needed.

## 2017-12-14 NOTE — H&P (Signed)
PULMONARY / CRITICAL CARE MEDICINE   NAME:  [name], MRN:  [id number], DOB:  04-16-1980, LOS: 0 ADMISSION DATE:  12/14/2017, CONSULTATION DATE:  12/14/2017 REFERRING MD:  Dr. Laverta Baltimore, CHIEF COMPLAINT:  N/V  BRIEF HISTORY:    37 yo w/CP presenting with progressive 1 day nausea/vomiting and abdominal pain.  SIRS and normotensive in ER.  SBO on CT abd/ pelvis. Concern for ischemic bowel given high lactate despite IVF.  Surgery consulted.  PCCM to admit to ICU.  HISTORY OF PRESENT ILLNESS   HPI obtained from medical chart review and limited information from patient.  No caregiver at bedside.   37 year old male with past medical history significant for but not limited to cerebral palsy, DVT on coumadin, HTN, and chronic constipation presenting with nausea, vomiting and abdominal pain that started this morning and progressively worse.    Patient lives at home with a caregiver.  Caregiver not present at this time.  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  Patient with prior abdominal surgery for small bowel obstruction but the time is unknown and last hospitalization for SBO was in 2013.  Patient reports last bowel movement was yesterday.  Unable to take any of medications today.  Denies fever.  Denies having flatus today.  In ER, patient noted to afebrile 97.1, normotensive with tachycardia and tachypnea.  Normal O2 saturations.  Abnormal significant for K 3.4, glucose 153, WBC 13.3, and Lactate 6.5 -> 5.92 after 2.5L of LR.  Normal LFTs, INR 2.35, and sCr normal at 1.14.  UA negative, noted high specific gravity. CXR noted for cardiomegaly, mild vascular congestion, low volumes and bibasilar atelectasis.  CT abd/pelvis noted for distal SBO and large stool burden.  Empirically covered on vanc, cefepime, and flagyl after blood cultures sent.  Surgery to be consulted by ER. Hospitalist is concerned for ischemic  bowel, given high lactate, ongoing tachycardia and abdominal pain out of portion to exam.  PCCM to admit to ICU for further medical management.   SIGNIFICANT PAST MEDICAL HISTORY   Cerebral palsy, chronic constipation, DVT on coumadin, HTN, insomnia, recurrent UTI, neurogenic bladder  SIGNIFICANT EVENTS:  12/14/2017 Admit  STUDIES:   11/17 CXR >> low lung volumes, cardiomegaly, vascular congestion, bibasilar atelectasis  11/17 CT abd/pelvis >> distal SBO, large stool burden in rectosigmoid colon, mildly prominent prostate, cholelithiasis, bibasilar atelectasis  CULTURES:  11/17 BCx 2 >> 11/17 UA >>  ANTIBIOTICS:  11/17 vanc >> 11/17 cefepime >> 11/17 flagyl >>  LINES/TUBES:  PIV x 2  CONSULTANTS:  11/17 CCS   SUBJECTIVE:   CONSTITUTIONAL: BP 122/73   Pulse (!) 138   Temp (!) 97.1 F (36.2 C)   Resp (!) 31   Wt 90.7 kg   SpO2 96%   BMI 28.70 kg/m   I/O last 3 completed shifts: In: 1000 [IV Piggyback:1000] Out: -   PHYSICAL EXAM: General:  Adult male lying in bed in moderate distress HEENT: MM very dry with cracked lips, wearing glasses, pupils 3/reactive Neuro: Alert, answers questions with simple one to two word responses, f/c CV: ST 150's, distant, weak peripheral pulses  PULM: even/non-labored, R exp wheeze, rhonchi, diminished in bases GI:  Some distention and diffuse tenderness, hypo BS, previous midline scar Extremities: cool/dry, LLE edema +2, trace RLE, contractures of all extremities, less so in RUE -still able to fully extend Skin: no rashes  RESOLVED PROBLEM LIST  ASSESSMENT AND PLAN    Small Bowel obstruction in the setting of chronic constipation - previous SBO and surgery, year unknown  - concern for ischemic bowel given ongoing elevated lactate despite IVFs P:  CCS consulted  Insert NGT to ILWS Ongoing hydration as below Goal K > / mag >2  SIRS P:  Tele monitoring Additional 1 L NS D5/ 0.45 NS w/ KCL 20 meq  Trend  lactate Monitor renal function goal MAP > 65  Leukocytosis -UA neg, CXR without infiltrate, concern abd process P:  Follow BC and UC Continue vanc, cefepime, and flagyl for now Assess PCT Trend WBC/ fever curve  Lactic acidosis - in the setting of normotension, normal renal function and acute abd pain concerning for ischemic bowel P:  IV fluids as above/ monitor UOP Trend lactate Surgery consulted  Cerebral Palsy with chronic spasticity  P:  Hold home baclofen, flexeril, oral valium, zanaflex IV robaxin q 8hrs May consider IV valium if needed prn Ativan prn anxiety  DVT on chronic coumadin with INR 2.35 P:  Hold coumadin Will need transition to heparin gtt after CCS evaluation  Hx of hypertension P:  Hold home metoprolol and HCTZ  SUMMARY OF TODAY'S PLAN:  Admit to ICU for close monitoring, trending lactate, surgery eval   Best Practice / Goals of Care / Disposition.   DVT PROPHYLAXIS: heparin gtt after surgery evaluation SUP: pepcid NUTRITION: NPO MOBILITY: BR GOALS OF CARE: Full FAMILY DISCUSSIONS: no family/ caregiver at bedside DISPOSITION ICU  LABS  Glucose No results for input(s): GLUCAP in the last 168 hours.  BMET Recent Labs  Lab 12/14/17 1727  NA 140  K 3.4*  CL 98  CO2 30  BUN 16  CREATININE 1.14  GLUCOSE 153*    Liver Enzymes Recent Labs  Lab 12/14/17 1727  AST 35  ALT 28  ALKPHOS 65  BILITOT 1.4*  ALBUMIN 3.9    Electrolytes Recent Labs  Lab 12/14/17 1727  CALCIUM 9.6    CBC Recent Labs  Lab 12/14/17 1727  WBC 13.3*  HGB 15.5  HCT 48.0  PLT 241    ABG No results for input(s): PHART, PCO2ART, PO2ART in the last 168 hours.  Coag's Recent Labs  Lab 12/14/17 1727  INR 2.35    Sepsis Markers Recent Labs  Lab 12/14/17 1736 12/14/17 1945  LATICACIDVEN 6.50* 5.92*    Cardiac Enzymes No results for input(s): TROPONINI, PROBNP in the last 168 hours.  PAST MEDICAL HISTORY :   He  has a past medical  history of Cerebral palsy ([place]), Chronic constipation, Coagulopathy ([place]), Hypertension, Insomnia, Recurrent UTI, and Tachycardia.  PAST SURGICAL HISTORY:  He  has a past surgical history that includes Small intestine surgery; Incise and drain abcess (09/11/10); Elbow surgery (09/12/10); and Tooth Extraction (11/22/2011).  Allergies  Allergen Reactions  . Betadine [Povidone Iodine] Other (See Comments)    Unknown reaction per caregiver  . Ciprofloxacin Other (See Comments)    Unknown reaction per caregiver  . Morphine Other (See Comments)    Unknown reaction per caregiver    No current facility-administered medications on file prior to encounter.    Current Outpatient Medications on File Prior to Encounter  Medication Sig  . acetaminophen (TYLENOL) 325 MG tablet Take 650 mg by mouth every 6 (six) hours as needed. For pain  . baclofen (LIORESAL) 20 MG tablet Take 1 tablet (20 mg total) by mouth 4 (four) times daily.  . calcium carbonate (TUMS - DOSED IN MG  ELEMENTAL CALCIUM) 500 MG chewable tablet Chew 1 tablet by mouth 3 (three) times daily.  . cetirizine (ZYRTEC) 10 MG tablet Take 10 mg by mouth daily.  . cholecalciferol (VITAMIN D) 1000 UNITS tablet Take 1,000 Units by mouth 2 (two) times daily.  . cyclobenzaprine (FLEXERIL) 10 MG tablet 1 at 8am and 12pm and 2 tabs at 8pm.  . diazepam (VALIUM) 2 MG tablet Take 1 tablet (2 mg total) by mouth 2 (two) times daily. Must last 30 days.  . diphenhydrAMINE (BENADRYL) 25 MG tablet Take 25 mg by mouth every 6 (six) hours as needed for allergies.  . fluticasone (VERAMYST) 27.5 MCG/SPRAY nasal spray Place 1 spray into the nose at bedtime as needed for rhinitis.  . hydrochlorothiazide (HYDRODIURIL) 25 MG tablet Take 12.5 mg by mouth daily.   Marland Kitchen loperamide (IMODIUM) 2 MG capsule Take 2 mg by mouth 3 (three) times daily as needed for diarrhea or loose stools. Reported on 05/11/2015  . metoprolol (TOPROL-XL) 100 MG 24 hr tablet Take 100 mg by mouth  daily.   . mineral oil enema Place 1 enema rectally as needed for severe constipation.  . Multiple Vitamin (MULITIVITAMIN WITH MINERALS) TABS Take 1 tablet by mouth daily.  . nitrofurantoin (MACRODANTIN) 50 MG capsule Take 1 capsule (50 mg total) by mouth daily.  Marland Kitchen senna-docusate (SENOKOT-S) 8.6-50 MG tablet Take 3 tablets by mouth at bedtime.  . tizanidine (ZANAFLEX) 2 MG capsule Take 1 capsule (2 mg total) by mouth 3 (three) times daily.  Marland Kitchen warfarin (COUMADIN) 3 MG tablet Take 3 mg by mouth daily at 6 PM.     FAMILY HISTORY:   His Family history is unknown by patient.  SOCIAL HISTORY:  He  reports that he has never smoked. He has never used smokeless tobacco. He reports that he does not drink alcohol or use drugs.  REVIEW OF SYSTEMS:    Unable to complete full ROS   Kennieth Rad, AGACNP-BC [facility] Pgr: [phone] or if no answer [phone] 12/14/2017, 11:00 PM

## 2017-12-15 ENCOUNTER — Inpatient Hospital Stay (HOSPITAL_COMMUNITY): Payer: Medicare Other

## 2017-12-15 ENCOUNTER — Inpatient Hospital Stay (HOSPITAL_COMMUNITY): Payer: Medicare Other | Admitting: Certified Registered"

## 2017-12-15 ENCOUNTER — Encounter (HOSPITAL_COMMUNITY)
Admission: EM | Disposition: A | Discharge status: Home / Self Care | Payer: Self-pay | Source: Home / Self Care | Attending: Internal Medicine

## 2017-12-15 DIAGNOSIS — J9601 Acute respiratory failure with hypoxia: Secondary | ICD-10-CM

## 2017-12-15 DIAGNOSIS — K56609 Unspecified intestinal obstruction, unspecified as to partial versus complete obstruction: Principal | ICD-10-CM

## 2017-12-15 DIAGNOSIS — J9690 Respiratory failure, unspecified, unspecified whether with hypoxia or hypercapnia: Secondary | ICD-10-CM

## 2017-12-15 DIAGNOSIS — Z7901 Long term (current) use of anticoagulants: Secondary | ICD-10-CM

## 2017-12-15 HISTORY — PX: LAPAROTOMY: SHX154

## 2017-12-15 LAB — CBC
HEMATOCRIT: 41.5 % (ref 39.0–52.0)
HEMOGLOBIN: 13.1 g/dL (ref 13.0–17.0)
MCH: 28.5 pg (ref 26.0–34.0)
MCHC: 31.6 g/dL (ref 30.0–36.0)
MCV: 90.4 fL (ref 80.0–100.0)
Platelets: 230 10*3/uL (ref 150–400)
RBC: 4.59 MIL/uL (ref 4.22–5.81)
RDW: 14.4 % (ref 11.5–15.5)
WBC: 6 10*3/uL (ref 4.0–10.5)
nRBC: 0 % (ref 0.0–0.2)

## 2017-12-15 LAB — BLOOD CULTURE ID PANEL (REFLEXED)
ACINETOBACTER BAUMANNII: NOT DETECTED
Candida albicans: NOT DETECTED
Candida glabrata: NOT DETECTED
Candida krusei: NOT DETECTED
Candida parapsilosis: NOT DETECTED
Candida tropicalis: NOT DETECTED
Enterobacter cloacae complex: NOT DETECTED
Enterobacteriaceae species: NOT DETECTED
Enterococcus species: NOT DETECTED
Escherichia coli: NOT DETECTED
Haemophilus influenzae: NOT DETECTED
Klebsiella oxytoca: NOT DETECTED
Klebsiella pneumoniae: NOT DETECTED
LISTERIA MONOCYTOGENES: NOT DETECTED
METHICILLIN RESISTANCE: DETECTED — AB
NEISSERIA MENINGITIDIS: NOT DETECTED
PROTEUS SPECIES: NOT DETECTED
PSEUDOMONAS AERUGINOSA: NOT DETECTED
SERRATIA MARCESCENS: NOT DETECTED
STAPHYLOCOCCUS AUREUS BCID: NOT DETECTED
STREPTOCOCCUS SPECIES: NOT DETECTED
Staphylococcus species: DETECTED — AB
Streptococcus agalactiae: NOT DETECTED
Streptococcus pneumoniae: NOT DETECTED
Streptococcus pyogenes: NOT DETECTED

## 2017-12-15 LAB — POCT I-STAT 3, ART BLOOD GAS (G3+)
ACID-BASE EXCESS: 1 mmol/L (ref 0.0–2.0)
Bicarbonate: 24.2 mmol/L (ref 20.0–28.0)
O2 SAT: 100 %
PH ART: 7.49 — AB (ref 7.350–7.450)
Patient temperature: 98.3
TCO2: 25 mmol/L (ref 22–32)
pCO2 arterial: 31.7 mmHg — ABNORMAL LOW (ref 32.0–48.0)
pO2, Arterial: 204 mmHg — ABNORMAL HIGH (ref 83.0–108.0)

## 2017-12-15 LAB — MRSA PCR SCREENING: MRSA by PCR: NEGATIVE

## 2017-12-15 LAB — PROTIME-INR
INR: 1.33
Prothrombin Time: 16.3 seconds — ABNORMAL HIGH (ref 11.4–15.2)

## 2017-12-15 LAB — GLUCOSE, CAPILLARY: GLUCOSE-CAPILLARY: 123 mg/dL — AB (ref 70–99)

## 2017-12-15 LAB — BASIC METABOLIC PANEL
ANION GAP: 8 (ref 5–15)
BUN: 12 mg/dL (ref 6–20)
CHLORIDE: 106 mmol/L (ref 98–111)
CO2: 24 mmol/L (ref 22–32)
Calcium: 7.8 mg/dL — ABNORMAL LOW (ref 8.9–10.3)
Creatinine, Ser: 0.92 mg/dL (ref 0.61–1.24)
GFR calc non Af Amer: >60 mL/min (ref >60)
Glucose, Bld: 154 mg/dL — ABNORMAL HIGH (ref 70–99)
POTASSIUM: 3.8 mmol/L (ref 3.5–5.1)
SODIUM: 138 mmol/L (ref 135–145)

## 2017-12-15 LAB — HIV ANTIBODY (ROUTINE TESTING W REFLEX): HIV SCREEN 4TH GENERATION: NONREACTIVE

## 2017-12-15 LAB — LACTIC ACID, PLASMA: Lactic Acid, Venous: 1.9 mmol/L (ref 0.5–1.9)

## 2017-12-15 LAB — MAGNESIUM: MAGNESIUM: 1.5 mg/dL — AB (ref 1.7–2.4)

## 2017-12-15 LAB — PROCALCITONIN: PROCALCITONIN: 0.77 ng/mL

## 2017-12-15 SURGERY — LAPAROTOMY, EXPLORATORY
Anesthesia: General

## 2017-12-15 MED ORDER — SODIUM CHLORIDE 0.9% FLUSH
10.0000 mL | INTRAVENOUS | Status: DC | PRN
  Administered 2017-12-18: 10 mL
  Filled 2017-12-15: qty 40

## 2017-12-15 MED ORDER — FENTANYL CITRATE (PF) 100 MCG/2ML IJ SOLN
INTRAMUSCULAR | Status: DC | PRN
  Administered 2017-12-15: 50 ug via INTRAVENOUS
  Administered 2017-12-15: 100 ug via INTRAVENOUS

## 2017-12-15 MED ORDER — HYDROMORPHONE HCL 1 MG/ML IJ SOLN
INTRAMUSCULAR | Status: AC
  Filled 2017-12-15: qty 1

## 2017-12-15 MED ORDER — DEXMEDETOMIDINE HCL IN NACL 400 MCG/100ML IV SOLN
0.4000 ug/kg/h | INTRAVENOUS | Status: DC
  Administered 2017-12-15 (×2): 1.2 ug/kg/h via INTRAVENOUS
  Administered 2017-12-15: 0.4 ug/kg/h via INTRAVENOUS
  Administered 2017-12-16 (×3): 1.2 ug/kg/h via INTRAVENOUS
  Administered 2017-12-16: 1 ug/kg/h via INTRAVENOUS
  Administered 2017-12-16 – 2017-12-17 (×8): 1.2 ug/kg/h via INTRAVENOUS
  Administered 2017-12-17: 1 ug/kg/h via INTRAVENOUS
  Administered 2017-12-18: 0.8 ug/kg/h via INTRAVENOUS
  Administered 2017-12-18: 0.9 ug/kg/h via INTRAVENOUS
  Administered 2017-12-18: 0.798 ug/kg/h via INTRAVENOUS
  Administered 2017-12-18: 0.4 ug/kg/h via INTRAVENOUS
  Administered 2017-12-19: 0.8 ug/kg/h via INTRAVENOUS
  Administered 2017-12-19: 0.9 ug/kg/h via INTRAVENOUS
  Administered 2017-12-19: 1 ug/kg/h via INTRAVENOUS
  Administered 2017-12-19: 1.001 ug/kg/h via INTRAVENOUS
  Administered 2017-12-19 – 2017-12-20 (×2): 1 ug/kg/h via INTRAVENOUS
  Administered 2017-12-20: 1.1 ug/kg/h via INTRAVENOUS
  Administered 2017-12-20 – 2017-12-21 (×4): 1 ug/kg/h via INTRAVENOUS
  Administered 2017-12-21: 1.1 ug/kg/h via INTRAVENOUS
  Administered 2017-12-21: 0.9 ug/kg/h via INTRAVENOUS
  Administered 2017-12-21: 1.1 ug/kg/h via INTRAVENOUS
  Administered 2017-12-21: 1 ug/kg/h via INTRAVENOUS
  Administered 2017-12-21: 0.9 ug/kg/h via INTRAVENOUS
  Administered 2017-12-22 (×2): 1 ug/kg/h via INTRAVENOUS
  Administered 2017-12-22: 0.7 ug/kg/h via INTRAVENOUS
  Administered 2017-12-23 (×2): 1 ug/kg/h via INTRAVENOUS
  Administered 2017-12-24: 0.4 ug/kg/h via INTRAVENOUS
  Filled 2017-12-15 (×6): qty 100
  Filled 2017-12-15: qty 200
  Filled 2017-12-15 (×7): qty 100
  Filled 2017-12-15: qty 200
  Filled 2017-12-15 (×9): qty 100
  Filled 2017-12-15: qty 200
  Filled 2017-12-15 (×3): qty 100
  Filled 2017-12-15: qty 200
  Filled 2017-12-15 (×11): qty 100

## 2017-12-15 MED ORDER — SODIUM CHLORIDE 0.9% FLUSH
10.0000 mL | Freq: Two times a day (BID) | INTRAVENOUS | Status: DC
  Administered 2017-12-15 – 2017-12-19 (×8): 10 mL
  Administered 2017-12-20: 30 mL
  Administered 2017-12-20 – 2017-12-24 (×8): 10 mL
  Administered 2017-12-25: 20 mL
  Administered 2017-12-26 (×2): 10 mL

## 2017-12-15 MED ORDER — FENTANYL BOLUS VIA INFUSION
50.0000 ug | INTRAVENOUS | Status: DC | PRN
  Administered 2017-12-15 – 2017-12-19 (×11): 50 ug via INTRAVENOUS
  Filled 2017-12-15: qty 50

## 2017-12-15 MED ORDER — MEPERIDINE HCL 50 MG/ML IJ SOLN: 6.2500 mg | INTRAMUSCULAR | Status: DC | PRN

## 2017-12-15 MED ORDER — ONDANSETRON HCL 4 MG/2ML IJ SOLN
INTRAMUSCULAR | Status: DC | PRN
  Administered 2017-12-15: 4 mg via INTRAVENOUS

## 2017-12-15 MED ORDER — HYDROMORPHONE HCL 1 MG/ML IJ SOLN
0.2500 mg | INTRAMUSCULAR | Status: DC | PRN
  Administered 2017-12-15 (×3): 0.5 mg via INTRAVENOUS

## 2017-12-15 MED ORDER — PROPOFOL 10 MG/ML IV BOLUS
INTRAVENOUS | Status: DC | PRN
  Administered 2017-12-15: 150 mg via INTRAVENOUS

## 2017-12-15 MED ORDER — FENTANYL CITRATE (PF) 100 MCG/2ML IJ SOLN
50.0000 ug | INTRAMUSCULAR | Status: DC | PRN
  Administered 2017-12-15: 50 ug via INTRAVENOUS
  Filled 2017-12-15: qty 2

## 2017-12-15 MED ORDER — SODIUM CHLORIDE 0.9 % IV SOLN
INTRAVENOUS | Status: DC
  Administered 2017-12-15: 20:00:00 via INTRAVENOUS
  Administered 2017-12-16: 10 mL/h via INTRAVENOUS
  Administered 2017-12-17 – 2017-12-18 (×4): via INTRAVENOUS

## 2017-12-15 MED ORDER — LACTATED RINGERS IV SOLN
INTRAVENOUS | Status: DC | PRN
  Administered 2017-12-15 (×2): via INTRAVENOUS

## 2017-12-15 MED ORDER — MAGNESIUM SULFATE 2 GM/50ML IV SOLN
2.0000 g | Freq: Once | INTRAVENOUS | Status: AC
  Administered 2017-12-15: 2 g via INTRAVENOUS
  Filled 2017-12-15: qty 50

## 2017-12-15 MED ORDER — SUGAMMADEX SODIUM 200 MG/2ML IV SOLN
INTRAVENOUS | Status: DC | PRN
  Administered 2017-12-15: 200 mg via INTRAVENOUS

## 2017-12-15 MED ORDER — ROCURONIUM BROMIDE 50 MG/5ML IV SOLN
60.0000 mg | Freq: Once | INTRAVENOUS | Status: AC
  Administered 2017-12-15: 60 mg via INTRAVENOUS
  Filled 2017-12-15: qty 6

## 2017-12-15 MED ORDER — SODIUM CHLORIDE 0.9 % IV BOLUS
1000.0000 mL | Freq: Once | INTRAVENOUS | Status: AC
  Administered 2017-12-15: 1000 mL via INTRAVENOUS

## 2017-12-15 MED ORDER — PROMETHAZINE HCL 25 MG/ML IJ SOLN: 6.2500 mg | INTRAMUSCULAR | Status: DC | PRN

## 2017-12-15 MED ORDER — CHLORHEXIDINE GLUCONATE CLOTH 2 % EX PADS
6.0000 | MEDICATED_PAD | Freq: Every day | CUTANEOUS | Status: DC
  Administered 2017-12-16 – 2017-12-28 (×13): 6 via TOPICAL

## 2017-12-15 MED ORDER — CALCIUM GLUCONATE-NACL 1-0.675 GM/50ML-% IV SOLN
1.0000 g | Freq: Once | INTRAVENOUS | Status: AC
  Administered 2017-12-15: 1000 mg via INTRAVENOUS
  Filled 2017-12-15: qty 50

## 2017-12-15 MED ORDER — ORAL CARE MOUTH RINSE
15.0000 mL | OROMUCOSAL | Status: DC
  Administered 2017-12-15 – 2017-12-23 (×82): 15 mL via OROMUCOSAL

## 2017-12-15 MED ORDER — MIDAZOLAM HCL 5 MG/5ML IJ SOLN
INTRAMUSCULAR | Status: DC | PRN
  Administered 2017-12-15: 2 mg via INTRAVENOUS

## 2017-12-15 MED ORDER — FENTANYL CITRATE (PF) 100 MCG/2ML IJ SOLN: 50.0000 ug | Freq: Once | INTRAMUSCULAR | Status: AC

## 2017-12-15 MED ORDER — ACETAMINOPHEN 10 MG/ML IV SOLN
1000.0000 mg | Freq: Four times a day (QID) | INTRAVENOUS | Status: AC | PRN
  Filled 2017-12-15: qty 100

## 2017-12-15 MED ORDER — LIDOCAINE 2% (20 MG/ML) 5 ML SYRINGE
INTRAMUSCULAR | Status: DC | PRN
  Administered 2017-12-15: 100 mg via INTRAVENOUS

## 2017-12-15 MED ORDER — FENTANYL CITRATE (PF) 100 MCG/2ML IJ SOLN
25.0000 ug | INTRAMUSCULAR | Status: DC | PRN
  Administered 2017-12-15 (×2): 50 ug via INTRAVENOUS
  Filled 2017-12-15 (×2): qty 2

## 2017-12-15 MED ORDER — CHLORHEXIDINE GLUCONATE CLOTH 2 % EX PADS: 6.0000 | MEDICATED_PAD | Freq: Every day | CUTANEOUS | Status: DC

## 2017-12-15 MED ORDER — ETOMIDATE 2 MG/ML IV SOLN
20.0000 mg | Freq: Once | INTRAVENOUS | Status: AC
  Administered 2017-12-15: 20 mg via INTRAVENOUS

## 2017-12-15 MED ORDER — ROCURONIUM BROMIDE 100 MG/10ML IV SOLN
INTRAVENOUS | Status: DC | PRN
  Administered 2017-12-15: 70 mg via INTRAVENOUS

## 2017-12-15 MED ORDER — KCL IN DEXTROSE-NACL 20-5-0.45 MEQ/L-%-% IV SOLN
INTRAVENOUS | Status: AC
  Administered 2017-12-15: 18:00:00 via INTRAVENOUS
  Administered 2017-12-16: 70 mL/h via INTRAVENOUS
  Administered 2017-12-16: 09:00:00 via INTRAVENOUS
  Filled 2017-12-15 (×4): qty 1000

## 2017-12-15 MED ORDER — TRAVASOL 10 % IV SOLN
INTRAVENOUS | Status: DC
  Administered 2017-12-15: 18:00:00 via INTRAVENOUS
  Filled 2017-12-15: qty 630

## 2017-12-15 MED ORDER — CHLORHEXIDINE GLUCONATE 0.12% ORAL RINSE (MEDLINE KIT)
15.0000 mL | Freq: Two times a day (BID) | OROMUCOSAL | Status: DC
  Administered 2017-12-15 – 2017-12-23 (×17): 15 mL via OROMUCOSAL

## 2017-12-15 MED ORDER — ETOMIDATE 2 MG/ML IV SOLN
INTRAVENOUS | Status: AC
  Administered 2017-12-15: 20 mg
  Filled 2017-12-15: qty 10

## 2017-12-15 MED ORDER — METOPROLOL TARTRATE 5 MG/5ML IV SOLN
2.5000 mg | Freq: Four times a day (QID) | INTRAVENOUS | Status: DC | PRN
  Administered 2017-12-15: 5 mg via INTRAVENOUS
  Filled 2017-12-15: qty 5

## 2017-12-15 MED ORDER — METOPROLOL TARTRATE 5 MG/5ML IV SOLN
5.0000 mg | Freq: Four times a day (QID) | INTRAVENOUS | Status: DC | PRN
  Administered 2017-12-23 – 2017-12-25 (×4): 5 mg via INTRAVENOUS
  Filled 2017-12-15 (×3): qty 5

## 2017-12-15 MED ORDER — 0.9 % SODIUM CHLORIDE (POUR BTL) OPTIME
TOPICAL | Status: DC | PRN
  Administered 2017-12-15: 1000 mL

## 2017-12-15 MED ORDER — ETOMIDATE 2 MG/ML IV SOLN: 20.0000 mg | Freq: Once | INTRAVENOUS | Status: DC

## 2017-12-15 MED ORDER — FENTANYL 2500MCG IN NS 250ML (10MCG/ML) PREMIX INFUSION
25.0000 ug/h | INTRAVENOUS | Status: DC
  Administered 2017-12-15: 25 ug/h via INTRAVENOUS
  Administered 2017-12-15: 350 ug/h via INTRAVENOUS
  Administered 2017-12-16 (×2): 400 ug/h via INTRAVENOUS
  Administered 2017-12-16: 200 ug/h via INTRAVENOUS
  Administered 2017-12-16 – 2017-12-17 (×4): 400 ug/h via INTRAVENOUS
  Administered 2017-12-17 – 2017-12-18 (×2): 300 ug/h via INTRAVENOUS
  Filled 2017-12-15 (×10): qty 250

## 2017-12-15 SURGICAL SUPPLY — 47 items
CHLORAPREP W/TINT 26ML (MISCELLANEOUS) ×3 IMPLANT
CLIP VESOCCLUDE LG 6/CT (CLIP) ×3 IMPLANT
CLIP VESOCCLUDE MED 6/CT (CLIP) ×3 IMPLANT
CLIP VESOCCLUDE SM WIDE 6/CT (CLIP) ×3 IMPLANT
COVER SURGICAL LIGHT HANDLE (MISCELLANEOUS) ×3 IMPLANT
COVER WAND RF STERILE (DRAPES) ×3 IMPLANT
DRAPE LAPAROSCOPIC ABDOMINAL (DRAPES) ×3 IMPLANT
DRAPE WARM FLUID 44X44 (DRAPE) ×3 IMPLANT
DRSG OPSITE POSTOP 4X10 (GAUZE/BANDAGES/DRESSINGS) IMPLANT
DRSG OPSITE POSTOP 4X8 (GAUZE/BANDAGES/DRESSINGS) IMPLANT
DRSG PAD ABDOMINAL 8X10 ST (GAUZE/BANDAGES/DRESSINGS) ×2 IMPLANT
DRSG TELFA 3X8 NADH (GAUZE/BANDAGES/DRESSINGS) ×6 IMPLANT
ELECT BLADE 6.5 EXT (BLADE) IMPLANT
ELECT CAUTERY BLADE 6.4 (BLADE) ×3 IMPLANT
ELECT REM PT RETURN 9FT ADLT (ELECTROSURGICAL) ×3
ELECTRODE REM PT RTRN 9FT ADLT (ELECTROSURGICAL) ×1 IMPLANT
GLOVE BIOGEL PI IND STRL 6.5 (GLOVE) IMPLANT
GLOVE BIOGEL PI IND STRL 7.0 (GLOVE) ×1 IMPLANT
GLOVE BIOGEL PI INDICATOR 6.5 (GLOVE) ×2
GLOVE BIOGEL PI INDICATOR 7.0 (GLOVE) ×2
GLOVE SURG SS PI 7.0 STRL IVOR (GLOVE) ×3 IMPLANT
GOWN STRL REUS W/ TWL LRG LVL3 (GOWN DISPOSABLE) ×2 IMPLANT
GOWN STRL REUS W/TWL LRG LVL3 (GOWN DISPOSABLE) ×6
KIT BASIN OR (CUSTOM PROCEDURE TRAY) ×3 IMPLANT
LIGASURE IMPACT 36 18CM CVD LR (INSTRUMENTS) IMPLANT
LOOP VESSEL MAXI BLUE (MISCELLANEOUS) IMPLANT
LOOP VESSEL MINI RED (MISCELLANEOUS) IMPLANT
NEEDLE 22X1 1/2 (OR ONLY) (NEEDLE) ×3 IMPLANT
PACK GENERAL/GYN (CUSTOM PROCEDURE TRAY) ×3 IMPLANT
PAD DRESSING TELFA 3X8 NADH (GAUZE/BANDAGES/DRESSINGS) IMPLANT
PENCIL BUTTON HOLSTER BLD 10FT (ELECTRODE) ×3 IMPLANT
SPONGE LAP 18X18 X RAY DECT (DISPOSABLE) IMPLANT
STAPLER VISISTAT 35W (STAPLE) ×5 IMPLANT
SUCTION POOLE TIP (SUCTIONS) ×3 IMPLANT
SUT PDS AB 0 CT 36 (SUTURE) ×4 IMPLANT
SUT PDS AB 1 TP1 96 (SUTURE) IMPLANT
SUT SILK 2 0 (SUTURE) ×3
SUT SILK 2 0 SH CR/8 (SUTURE) ×3 IMPLANT
SUT SILK 2-0 18XBRD TIE 12 (SUTURE) ×1 IMPLANT
SUT SILK 3 0 (SUTURE) ×3
SUT SILK 3 0 SH CR/8 (SUTURE) ×3 IMPLANT
SUT SILK 3-0 18XBRD TIE 12 (SUTURE) ×1 IMPLANT
TOWEL OR 17X26 10 PK STRL BLUE (TOWEL DISPOSABLE) ×3 IMPLANT
TRAY FOLEY MTR SLVR 16FR STAT (SET/KITS/TRAYS/PACK) ×3 IMPLANT
TUBE CONNECTING 12'X1/4 (SUCTIONS) ×1
TUBE CONNECTING 12X1/4 (SUCTIONS) ×1 IMPLANT
YANKAUER SUCT BULB TIP NO VENT (SUCTIONS) ×6 IMPLANT

## 2017-12-15 NOTE — Anesthesia Procedure Notes (Signed)
Procedure Name: Intubation Date/Time: 12/15/2017 1:23 AM Performed by: Babs Bertin, CRNA Pre-anesthesia Checklist: Patient identified, Emergency Drugs available, Suction available and Patient being monitored Patient Re-evaluated:Patient Re-evaluated prior to induction Oxygen Delivery Method: Circle System Utilized Preoxygenation: Pre-oxygenation with 100% oxygen Induction Type: IV induction, Rapid sequence and Cricoid Pressure applied Laryngoscope Size: Mac and 3 Grade View: Grade I Tube type: Subglottic suction tube Tube size: 7.5 mm Number of attempts: 1 Airway Equipment and Method: Stylet and Oral airway Placement Confirmation: ETT inserted through vocal cords under direct vision,  positive ETCO2 and breath sounds checked- equal and bilateral Secured at: 22 cm Tube secured with: Tape Dental Injury: Teeth and Oropharynx as per pre-operative assessment

## 2017-12-15 NOTE — Progress Notes (Signed)
Post intubation ABG obtained on patient on ventilator settings of tidal volume of 490, respiratory rate of 18, PEEP of 5, and FIO2 of 100%.  Decreased FIO2 to 60%.  Results given to MD.    Ref. Range 12/15/2017 10:48  Sample type Unknown ARTERIAL  pH, Arterial Latest Ref Range: 7.350 - 7.450  7.490 (H)  pCO2 arterial Latest Ref Range: 32.0 - 48.0 mmHg 31.7 (L)  pO2, Arterial Latest Ref Range: 83.0 - 108.0 mmHg 204.0 (H)  TCO2 Latest Ref Range: 22 - 32 mmol/L 25  Acid-Base Excess Latest Ref Range: 0.0 - 2.0 mmol/L 1.0  Bicarbonate Latest Ref Range: 20.0 - 28.0 mmol/L 24.2  O2 Saturation Latest Units: % 100.0  Patient temperature Unknown 98.3 F  Collection site Unknown RADIAL, ALLEN'S TEST ACCEPTABLE

## 2017-12-15 NOTE — Progress Notes (Signed)
Chaplain responded to medical alert.  Pt was being intubated after a Code Blue was cancelled.  Pt's caregiver was not present. Chaplain provided ministry of presence.  Will continue to follow as unit chaplain not at hospital today. Tamsen Snider [phone] [facility]. Of Spiritual Care

## 2017-12-15 NOTE — Progress Notes (Signed)
[facility] Progress Note Patient Name: [name] DOB: 12/05/1980 MRN: [id number]   Date of Service  12/15/2017  HPI/Events of Note  Mg++ = 1.5 and Creatinine = 0.92.  eICU Interventions  Will replace Mg++.      Intervention Category Major Interventions: Electrolyte abnormality - evaluation and management  Sommer,Steven Eugene 12/15/2017, 7:02 AM

## 2017-12-15 NOTE — Progress Notes (Signed)
[facility] Progress Note Patient Name: [name] DOB: [birth year] MRN: [id number]   Date of Service  12/15/2017  HPI/Events of Note  Oliguria - Bladder scan with residual of 350 mL and CVP = 9.   eICU Interventions  Will order: 1. Place Foley Catheter. 2. Bolus with 0.9 NaCl IV over 1 hour now.      Intervention Category Intermediate Interventions: Oliguria - evaluation and management  Sommer,Steven Eugene 12/15/2017, 8:10 PM

## 2017-12-15 NOTE — Transfer of Care (Signed)
Immediate Anesthesia Transfer of Care Note  Patient: [name]  Procedure(s) Performed: EXPLORATORY LAPAROTOMY (N/A )  Patient Location: PACU  Anesthesia Type:General  Level of Consciousness: awake, alert  and oriented  Airway & Oxygen Therapy: Patient Spontanous Breathing and Patient connected to face mask oxygen  Post-op Assessment: Report given to RN and Post -op Vital signs reviewed and stable  Post vital signs: Reviewed and stable  Last Vitals:  Vitals Value Taken Time  BP    Temp    Pulse    Resp    SpO2      Last Pain:  Vitals:   12/14/17 1633  PainSc: 5          Complications: No apparent anesthesia complications

## 2017-12-15 NOTE — Progress Notes (Addendum)
Received pt from PACU nursing staff. Pt hr 140s oxygen sat 76% and abd pain 3/10, placed on venturi face mask and hob elevated. Lower right arm with patchy redness and purple discoloration, extremity warm with 2+ radial and brachial pulse, pt denies discomfort. Pt placed on venturi face mask and hob elevated, rr unlabored, pt denies sob. [facility] nurse informed of pt condition. Pt left thigh to shin grossly larger than right leg, pt caregiver at bedside to explain size of left leg is at pts baseline.

## 2017-12-15 NOTE — Progress Notes (Addendum)
NAME:  [name], MRN:  [id number], DOB:  03/26/1980, LOS: 1 ADMISSION DATE:  12/14/2017, CONSULTATION DATE:  12/15/2017 REFERRING MD:  N/A, CHIEF COMPLAINT:  Abdominal pain with n/v  Brief History   [name] is a 37y/o male with PMH of Cerebral palsy, DVT on coumadin, HTN, and chronic constipation. He has also had a history of skin ulceration and breakdown.  Past Medical History  Cerebral palsy, SBO, DVT on coumadin, HTN,   Significant Hospital Events   11/18 - Exploratory Laparotomy with lysis of adhesions 11/18 - Desaturation into 50s and initially unresponsive, now intubated and sedated  Consults:  General Surgery  Procedures:  Exploratory lapartomy  Significant Diagnostic Tests:  CT Abd: Dilated small bowel with air-fluid levels to the distal small bowel compatible with distal small bowel obstruction.  Micro Data:  BCx: pending Urine Cx: pending  Antimicrobials:  Vancomycin 1000mg  q8hrs, Metronidazole 500mg  q8hrs, Cefepime 2g q8hrs  Interim history/subjective:  Patient had documented desaturations in the the room this morning after receiving his scheduled dose of Fentanyl. Patient was bagged successfully, did open eyes and respond but was not showing good respiratory drive. Decision was made to sedate and intubate patient.   Objective   Blood pressure 116/76, pulse (!) 121, temperature 98.3 F (36.8 C), temperature source Axillary, resp. rate 17, weight 90.7 kg, SpO2 93 %.        Intake/Output Summary (Last 24 hours) at 12/15/2017 0826 Last data filed at 12/15/2017 0700 Gross per 24 hour  Intake 2457.39 ml  Output 500 ml  Net 1957.39 ml   Filed Weights   12/14/17 1823  Weight: 90.7 kg    Examination: General: sedated HENT: pupils reactive, NG tube in place suctioning well, ETT in place Lungs: CTAB with lung sound bilaterally Cardiovascular: regular rhythm, tachycardia, no murmurs Abdomen: soft, hypoactive bowel sounds present,  midline bandage in place and clean Extremities: +2 pedal pulses, no edema or cyanosis Neuro: RASS  Score -1 to -2   Resolved Hospital Problem list   Lactic Acidosis  Assessment & Plan:   Acute Hypoxic Respiratory Failure - Unclear etiology but likely multifactorial due to narcotics, aspiration, and post surgical respiratory depression - Daily monitoring for SBT - CXR to evaluate for PNA  Small Bowel Obstruction s/p Exploratory Laparotomy - General Surgery following; continue NG tube - Cont Abx Vanc, Cefepime, and Metronidazole - Start TNA as patient has had this before and typically takes him 1 week to recover - Start airbed due to complications from skin ulcers and tissue breakdown from prior admissions - Cont D51/2NS @ 181ml/hr  Cerebral Palsy with chronic spasticity - Holding home meds: baclofen, flexeril, oral valium, zanaflex - Robaxin IV q8hrs - Ativan prn  DVT on chronic coumadin INR 1.33 - Coumadin held and Vit K given along with [facility] for exploratory lap - Follow INR; transition to heparin then bridge to coumadin after 24 hours post-op  HTN - Holding home meds metoprolol and HCTZ - Well controlled for now, cont to monitor will restart home meds as needed  Paroxymsal Atrial Fibrilation - controlled with home medications Metoprolol and coumadin - Holding metoprolol for now, continue to monitor heart rate  Best practice:  Diet: NPO, NG tube in place Pain/Anxiety/Delirium protocol (if indicated): Ativan prn VAP protocol (if indicated): Initiated on 11/18 DVT prophylaxis: SDCs for now -> heparin to warfarin bridge GI prophylaxis: Pepcid 20mg  BID Mobility: bed/wheelchair bound Code Status: full Family Communication: Sister is HCPOA Disposition: ICU  Labs  CBC: Recent Labs  Lab 12/14/17 1727 12/15/17 0440  WBC 13.3* 6.0  NEUTROABS 11.9*  --   HGB 15.5 13.1  HCT 48.0 41.5  MCV 90.4 90.4  PLT 241 563    Basic Metabolic Panel: Recent Labs  Lab  12/14/17 1727 12/14/17 1939 12/15/17 0440  NA 140  --  138  K 3.4*  --  3.8  CL 98  --  106  CO2 30  --  24  GLUCOSE 153*  --  154*  BUN 16  --  12  CREATININE 1.14  --  0.92  CALCIUM 9.6  --  7.8*  MG  --  2.0 1.5*   GFR: Estimated Creatinine Clearance: 123.3 mL/min (by C-G formula based on SCr of 0.92 mg/dL). Recent Labs  Lab 12/14/17 1727 12/14/17 1736 12/14/17 1939 12/14/17 1945 12/14/17 2240 12/15/17 0440 12/15/17 0444  PROCALCITON  --   --  0.77  --   --   --   --   WBC 13.3*  --   --   --   --  6.0  --   LATICACIDVEN  --  6.50*  --  5.92* 3.01*  --  1.9    Liver Function Tests: Recent Labs  Lab 12/14/17 1727  AST 35  ALT 28  ALKPHOS 65  BILITOT 1.4*  PROT 7.3  ALBUMIN 3.9   Recent Labs  Lab 12/14/17 1727  LIPASE 23   No results for input(s): AMMONIA in the last 168 hours.  ABG    Component Value Date/Time   PHART 7.420 12/14/2017 2151   PCO2ART 40.3 12/14/2017 2151   PO2ART 55.0 (L) 12/14/2017 2151   HCO3 26.4 12/14/2017 2151   TCO2 28 12/14/2017 2151   O2SAT 90.0 12/14/2017 2151     Coagulation Profile: Recent Labs  Lab 12/14/17 1727 12/15/17 0440  INR 2.35 1.33    Cardiac Enzymes: No results for input(s): CKTOTAL, CKMB, CKMBINDEX, TROPONINI in the last 168 hours.  HbA1C: No results found for: HGBA1C  CBG: Recent Labs  Lab 12/15/17 0342  GLUCAP 123*    Review of Systems:   Unable to obtain ROS as patient is intubated and sedated  Past Medical History  He,  has a past medical history of Cerebral palsy ([place]), Chronic constipation, Coagulopathy ([place]), Hypertension, Insomnia, Recurrent UTI, and Tachycardia.   Surgical History    Past Surgical History:  Procedure Laterality Date  . ELBOW SURGERY  09/12/10   left  . INCISE AND DRAIN ABCESS  09/11/10  . SMALL INTESTINE SURGERY    . TOOTH EXTRACTION  11/22/2011   Procedure: DENTAL RESTORATION/EXTRACTIONS;  Surgeon: Marcelo Baldy, MD;  Location: [facility];  Service: Dentistry;   Laterality: N/A;  dental restoration and cleaning; NO EXTRACTIONS     Social History   reports that he has never smoked. He has never used smokeless tobacco. He reports that he does not drink alcohol or use drugs.   Family History   His Family history is unknown by patient.   Allergies Allergies  Allergen Reactions  . Betadine [Povidone Iodine] Other (See Comments)    Unknown reaction per caregiver  . Ciprofloxacin Other (See Comments)    Unknown reaction per caregiver  . Morphine Other (See Comments)    Unknown reaction per caregiver     Home Medications  Prior to Admission medications   Medication Sig Start Date End Date Taking? Authorizing Provider  acetaminophen (TYLENOL) 325 MG tablet Take 650 mg by mouth every 6 (  six) hours as needed. For pain   Yes [provider]  baclofen (LIORESAL) 20 MG tablet Take 1 tablet (20 mg total) by mouth 4 (four) times daily. 07/22/17  Yes Dennie Bible, NP  calcium carbonate (TUMS - DOSED IN MG ELEMENTAL CALCIUM) 500 MG chewable tablet Chew 1 tablet by mouth 3 (three) times daily.   Yes [provider]  cetirizine (ZYRTEC) 10 MG tablet Take 10 mg by mouth daily.   Yes [provider]  cholecalciferol (VITAMIN D) 1000 UNITS tablet Take 1,000 Units by mouth 2 (two) times daily.   Yes [provider]  cyclobenzaprine (FLEXERIL) 10 MG tablet 1 at 8am and 12pm and 2 tabs at 8pm. 07/22/17  Yes Dennie Bible, NP  diazepam (VALIUM) 2 MG tablet Take 1 tablet (2 mg total) by mouth 2 (two) times daily. Must last 30 days. 07/22/17  Yes Dennie Bible, NP  diphenhydrAMINE (BENADRYL) 25 MG tablet Take 25 mg by mouth every 6 (six) hours as needed for allergies.   Yes [provider]  fluticasone (VERAMYST) 27.5 MCG/SPRAY nasal spray Place 1 spray into the nose at bedtime as needed for rhinitis.   Yes [provider]  hydrochlorothiazide (HYDRODIURIL) 25 MG tablet Take 12.5 mg by mouth  daily.    Yes [provider]  loperamide (IMODIUM) 2 MG capsule Take 2 mg by mouth 3 (three) times daily as needed for diarrhea or loose stools. Reported on 05/11/2015   Yes [provider]  metoprolol (TOPROL-XL) 100 MG 24 hr tablet Take 100 mg by mouth daily.    Yes [provider]  mineral oil enema Place 1 enema rectally as needed for severe constipation.   Yes [provider]  Multiple Vitamin (MULITIVITAMIN WITH MINERALS) TABS Take 1 tablet by mouth daily.   Yes [provider]  nitrofurantoin (MACRODANTIN) 50 MG capsule Take 1 capsule (50 mg total) by mouth daily. 05/23/15  Yes Thurnell Lose, MD  senna-docusate (SENOKOT-S) 8.6-50 MG tablet Take 3 tablets by mouth at bedtime.   Yes [provider]  tizanidine (ZANAFLEX) 2 MG capsule Take 1 capsule (2 mg total) by mouth 3 (three) times daily. 07/22/17  Yes Dennie Bible, NP  warfarin (COUMADIN) 3 MG tablet Take 3 mg by mouth daily at 6 PM.    Yes [provider]     Critical care time: >30 minutes    Harolyn Rutherford, DO [place], PGY-2

## 2017-12-15 NOTE — Procedures (Signed)
Intubation Procedure Note [name] [id number] April 01, 1980  Procedure: Intubation Indications: Respiratory insufficiency  Procedure Details Consent: Unable to obtain consent because of emergent medical necessity. Time Out: Verified patient identification, verified procedure, site/side was marked, verified correct patient position, special equipment/implants available, medications/allergies/relevent history reviewed, required imaging and test results available.  Performed  Maximum sterile technique was used including cap, gloves, hand hygiene and mask.  MAC and 4    Evaluation Hemodynamic Status: BP stable throughout; O2 sats: stable throughout Patient's Current Condition: stable Complications: No apparent complications Patient did tolerate procedure well. Chest X-ray ordered to verify placement.  CXR: pending.   Clementeen Graham 12/15/2017  Erick Colace ACNP-BC [place] Pager # [phone] OR # [phone] if no answer

## 2017-12-15 NOTE — Progress Notes (Signed)
PHARMACY - ADULT TOTAL PARENTERAL NUTRITION CONSULT NOTE   Pharmacy Consult for TPN Indication: Nutrition s/p surgery for small bowel obstruction  Patient Measurements: Height: 5' 5" (165.1 cm) Weight: 200 lb (90.7 kg) IBW/kg (Calculated) : 61.5  AdjBW = 68.8 kg   Body mass index is 33.28 kg/m. Usual Weight: 90.7 kg  Assessment:  Patient admitted 11/17 by ED p/w abdominal pain, N/V, lactic acidosis to 6, WBC 13, CT scan supportive of SBO. Pt required immediate surgery. 11/18 0208 s/p surgery for SBO; found diffusely dilated small intestine without transition point, small amount of adhesions, no ischemia. Patient extubated in OR, transferred to 66M,  intubated due to insufficient oxygenation at 0918. We are starting TPN because historically patient has had long post-op illeuses.  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 INR 2.35 admit > 1.33 11/18  TPN Access: 11/18 Central line to be placed today. TPN start date: 11/18 Nutritional Goals (awaiting RD assesment): KCal: 1,200 kcal/d (per 11-14 kCal/kg/d x 90.7, BMI 33)  Protein: 120 g (2 g/kg/d IBW for BMI 33) Fluid: 2914 mL   Current Nutrition:  Patient report a few days of N/V, but otherwise normal diet. Patient looks appropriately nourished.  Plan:  Initiate TPN at 35 mL/hr (eventual goal 70 mL/hr). Hold 20% lipid emulsion for  first 7 days for ICU patients per ASPEN guidelines (Start date 11/25) This TPN provides 63 g of protein, 109 g of dextrose, and 0 g of lipids which provides 617 kCals per day, meeting 51% of patient needs. Electrolytes in TPN: Standard, additional 14mq K+  Supplementing Calcium 1g x 1 outside of TPN. Add MVI, trace elements.  Currently no insulin requirements (0 units of regular insulin, and 0 to TPN), continue to monitor BG and  initiate SSI q4h as needed. IVMF D5W with 1/2NS with 20 mEq KCl at 100 ml/hr will be reduced to 70 mL/h once TPN starts. DC famotidine, add to TPN. Monitor TPN labs, Phos, Mg, BMP. F/U 11/19  [name] 12/15/2017,10:25 AM

## 2017-12-15 NOTE — Progress Notes (Signed)
CCM md and nurse practitioner to bedside to assess pt. This rn notes  improvement of discolored lower right arm., pulses remain intact. Pt tolerating decrease titration of venturi face mask to nasal cannula.

## 2017-12-15 NOTE — Procedures (Signed)
Central Venous Catheter Insertion Procedure Note [name] [id number] 08-Sep-1980  Procedure: Insertion of Central Venous Catheter Indications: Assessment of intravascular volume, Drug and/or fluid administration and Frequent blood sampling  Procedure Details Consent: Risks of procedure as well as the alternatives and risks of each were explained to the (patient/caregiver).  Consent for procedure obtained. Time Out: Verified patient identification, verified procedure, site/side was marked, verified correct patient position, special equipment/implants available, medications/allergies/relevent history reviewed, required imaging and test results available.  Performed Real time [place] used to ID and cannulate vessel   Maximum sterile technique was used including antiseptics, cap, gloves, gown, hand hygiene, mask and sheet. Skin prep: Chlorhexidine; local anesthetic administered A antimicrobial bonded/coated triple lumen catheter was placed in the right internal jugular vein using the Seldinger technique.  Evaluation Blood flow good Complications: No apparent complications Patient did tolerate procedure well. Chest X-ray ordered to verify placement.  CXR: pending.  Clementeen Graham 12/15/2017, 4:28 PM  Erick Colace ACNP-BC [place] Pager # [phone] OR # [phone] if no answer

## 2017-12-15 NOTE — Progress Notes (Addendum)
Day of Surgery   Subjective/Chief Complaint: Patient having significant abdominal pain - PRN fentanyl not adequate at current dose Lactate normal Tachycardic secondary to pain   Objective: Vital signs in last 24 hours: Temp:  [97.1 F (36.2 C)-99.9 F (37.7 C)] 98.3 F (36.8 C) (11/18 0737) Pulse Rate:  [118-146] 121 (11/18 0700) Resp:  [11-36] 17 (11/18 0700) BP: (94-143)/(66-99) 116/76 (11/18 0700) SpO2:  [93 %-100 %] 93 % (11/18 0700) Weight:  [90.7 kg] 90.7 kg (11/17 1823) Last BM Date: 12/13/17(per caregiver)  Intake/Output from previous day: 11/17 0701 - 11/18 0700 In: 2457.4 [I.V.:1360.4; IV Piggyback:1097] Out: 500 [Urine:430; Emesis/NG output:20; Blood:50] Intake/Output this shift: No intake/output data recorded.  WDWN; uncomfortable Contracted extremities Abd - distended, no bowel sounds; tender to palpation  Lab Results:  Recent Labs    12/14/17 1727 12/15/17 0440  WBC 13.3* 6.0  HGB 15.5 13.1  HCT 48.0 41.5  PLT 241 230   BMET Recent Labs    12/14/17 1727 12/15/17 0440  NA 140 138  K 3.4* 3.8  CL 98 106  CO2 30 24  GLUCOSE 153* 154*  BUN 16 12  CREATININE 1.14 0.92  CALCIUM 9.6 7.8*   PT/INR Recent Labs    12/14/17 1727 12/15/17 0440  LABPROT 25.4* 16.3*  INR 2.35 1.33   ABG Recent Labs    12/14/17 2151  PHART 7.420  HCO3 26.4    Studies/Results: Ct Abdomen Pelvis W Contrast  Result Date: 12/14/2017 CLINICAL DATA:  Acute abdominal pain, nausea, vomiting. EXAM: CT ABDOMEN AND PELVIS WITH CONTRAST TECHNIQUE: Multidetector CT imaging of the abdomen and pelvis was performed using the standard protocol following bolus administration of intravenous contrast. CONTRAST:  120mL OMNIPAQUE IOHEXOL 300 MG/ML  SOLN COMPARISON:  06/07/2011 FINDINGS: Lower chest: Cardiomegaly.  Bibasilar atelectasis.  No effusions. Hepatobiliary: To gallstones within the gallbladder, the largest 1.8 cm. No focal hepatic abnormality. Pancreas: No focal  abnormality or ductal dilatation. Spleen: No focal abnormality.  Normal size. Adrenals/Urinary Tract: No adrenal abnormality. No focal renal abnormality. No stones or hydronephrosis. Urinary bladder is unremarkable. Stomach/Bowel: Large stool burden within the rectosigmoid colon. Dilated small bowel loops in the abdomen and pelvis. The distal ileum is decompressed. Findings compatible with distal small bowel obstruction. Exact cause or transition point is not visualized. Stomach is mildly dilated. Vascular/Lymphatic: No evidence of aneurysm or adenopathy. Reproductive: Mildly prominent prostate. Other: No free fluid or free air. Musculoskeletal: No acute bony abnormality. IMPRESSION: Dilated small bowel with air-fluid levels to the distal small bowel compatible with distal small bowel obstruction. Exact cause and transition point not visualized. Large stool burden in the rectosigmoid colon. Mildly prominent prostate. Cholelithiasis. Bibasilar atelectasis. Electronically Signed   By: Rolm Baptise M.D.   On: 12/14/2017 19:02   Dg Chest Port 1 View  Result Date: 12/14/2017 CLINICAL DATA:  Tachypnea EXAM: PORTABLE CHEST 1 VIEW COMPARISON:  11/14/2011 FINDINGS: Cardiomegaly. Vascular congestion. Very low lung volumes with bibasilar atelectasis. No definite visible effusions or acute bony abnormality. IMPRESSION: Very low lung volumes with cardiomegaly, vascular congestion and bibasilar atelectasis. Electronically Signed   By: Rolm Baptise M.D.   On: 12/14/2017 18:14   Dg Abd Portable 1v  Result Date: 12/15/2017 CLINICAL DATA:  NG tube placement EXAM: PORTABLE ABDOMEN - 1 VIEW COMPARISON:  None. FINDINGS: Enteric tube is been placed. Tip is at the expected location of the EG junction, likely in the distal esophagus. Gaseous distention of visualized small bowel. Skin clips along the midline consistent  with recent surgery. Calcifications in the right upper quadrant likely gallstones. IMPRESSION: Enteric tube tip  is at the expected location of the EG junction, likely in the distal esophagus. Electronically Signed   By: Lucienne Capers M.D.   On: 12/15/2017 05:50    Anti-infectives: Anti-infectives (From admission, onward)   Start     Dose/Rate Route Frequency Ordered Stop   12/15/17 0300  vancomycin (VANCOCIN) IVPB 1000 mg/200 mL premix     1,000 mg 200 mL/hr over 60 Minutes Intravenous Every 8 hours 12/14/17 1844     12/14/17 1900  vancomycin (VANCOCIN) 1,750 mg in sodium chloride 0.9 % 500 mL IVPB     1,750 mg 250 mL/hr over 120 Minutes Intravenous  Once 12/14/17 1844 12/14/17 2343   12/14/17 1845  ceFEPIme (MAXIPIME) 2 g in sodium chloride 0.9 % 100 mL IVPB     2 g 200 mL/hr over 30 Minutes Intravenous Every 8 hours 12/14/17 1844     12/14/17 1800  ceFEPIme (MAXIPIME) 2 g in sodium chloride 0.9 % 100 mL IVPB  Status:  Discontinued     2 g 200 mL/hr over 30 Minutes Intravenous  Once 12/14/17 1753 12/14/17 1844   12/14/17 1800  metroNIDAZOLE (FLAGYL) IVPB 500 mg     500 mg 100 mL/hr over 60 Minutes Intravenous Every 8 hours 12/14/17 1753     12/14/17 1800  vancomycin (VANCOCIN) IVPB 1000 mg/200 mL premix  Status:  Discontinued     1,000 mg 200 mL/hr over 60 Minutes Intravenous  Once 12/14/17 1753 12/14/17 1844      Assessment/Plan: Abdominal pain/ CT findings of SBO/ lactic acidosis of 6 S/p exploratory laparotomy with lysis of adhesions, but no clear transition point.  Lactate normalized WBC normal  Increase Fentanyl for better pain control Await bowel function - continue NG tube Would start TNA today since previous surgeries have always resulted in very long post-operative ileus High risk for skin breakdown - usually placed on airbed early in admission.  Has had previous plastic surgery flaps for previous pressure ulcers  Medical management of other issues per primary team.  LOS: 1 day    Maia Petties 12/15/2017

## 2017-12-15 NOTE — Anesthesia Preprocedure Evaluation (Signed)
Anesthesia Evaluation  Patient identified by MRN, date of birth, ID band Patient awake    Reviewed: Allergy & Precautions, H&P , NPO status , Patient's Chart, lab work & pertinent test results, reviewed documented beta blocker date and time   History of Anesthesia Complications Negative for: history of anesthetic complications  Airway Mallampati: II  TM Distance: >3 FB Neck ROM: Limited    Dental  (+) Teeth Intact, Dental Advisory Given   Pulmonary    breath sounds clear to auscultation       Cardiovascular hypertension, Pt. on medications and Pt. on home beta blockers + DVT (On coumadin)  + dysrhythmias Atrial Fibrillation  Rhythm:Regular Rate:Tachycardia     Neuro/Psych Cerebral palsy    GI/Hepatic   Endo/Other    Renal/GU Renal disease     Musculoskeletal   Abdominal   Peds  Hematology  (+) anemia ,   Anesthesia Other Findings   Reproductive/Obstetrics                             Anesthesia Physical  Anesthesia Plan  ASA: III  Anesthesia Plan: General   Post-op Pain Management:    Induction: Intravenous, Rapid sequence and Cricoid pressure planned  PONV Risk Score and Plan: 4 or greater and Ondansetron, Dexamethasone and Midazolam  Airway Management Planned: Oral ETT  Additional Equipment: None  Intra-op Plan:   Post-operative Plan: Extubation in OR  Informed Consent: I have reviewed the patients History and Physical, chart, labs and discussed the procedure including the risks, benefits and alternatives for the proposed anesthesia with the patient or authorized representative who has indicated his/her understanding and acceptance.     Plan Discussed with: CRNA and Surgeon  Anesthesia Plan Comments:        Anesthesia Quick Evaluation

## 2017-12-15 NOTE — Progress Notes (Signed)
  PHARMACY - PHYSICIAN COMMUNICATION CRITICAL VALUE ALERT - BLOOD CULTURE IDENTIFICATION (BCID)  Results for orders placed or performed during the hospital encounter of 12/14/17  Blood Culture ID Panel (Reflexed) (Collected: 12/14/2017  5:20 PM)  Result Value Ref Range   Enterococcus species NOT DETECTED NOT DETECTED   Listeria monocytogenes NOT DETECTED NOT DETECTED   Staphylococcus species DETECTED (A) NOT DETECTED   Staphylococcus aureus (BCID) NOT DETECTED NOT DETECTED   Methicillin resistance DETECTED (A) NOT DETECTED   Streptococcus species NOT DETECTED NOT DETECTED   Streptococcus agalactiae NOT DETECTED NOT DETECTED   Streptococcus pneumoniae NOT DETECTED NOT DETECTED   Streptococcus pyogenes NOT DETECTED NOT DETECTED   Acinetobacter baumannii NOT DETECTED NOT DETECTED   Enterobacteriaceae species NOT DETECTED NOT DETECTED   Enterobacter cloacae complex NOT DETECTED NOT DETECTED   Escherichia coli NOT DETECTED NOT DETECTED   Klebsiella oxytoca NOT DETECTED NOT DETECTED   Klebsiella pneumoniae NOT DETECTED NOT DETECTED   Proteus species NOT DETECTED NOT DETECTED   Serratia marcescens NOT DETECTED NOT DETECTED   Haemophilus influenzae NOT DETECTED NOT DETECTED   Neisseria meningitidis NOT DETECTED NOT DETECTED   Pseudomonas aeruginosa NOT DETECTED NOT DETECTED   Candida albicans NOT DETECTED NOT DETECTED   Candida glabrata NOT DETECTED NOT DETECTED   Candida krusei NOT DETECTED NOT DETECTED   Candida parapsilosis NOT DETECTED NOT DETECTED   Candida tropicalis NOT DETECTED NOT DETECTED    Name of physician (or Provider) Contacted: Dr Halford Chessman  Changes to prescribed antibiotics required: no abx for now Bcx 1\5 CNS probable contaminant WBC wnl, spike fever this evening will watch  Bonnita Nasuti Pharm.D. CPP, BCPS Clinical Pharmacist [phone] 12/15/2017 5:56 PM

## 2017-12-15 NOTE — Op Note (Addendum)
Preoperative diagnosis: small bowel obstruction  Postoperative diagnosis: diffuse dilation of small intestine  Procedure: exploratory laparotomy, lysis of adhesions  Surgeon: Gurney Maxin, M.D.  Asst: none  Anesthesia: general  Indications for procedure: [name] is a 37 y.o. year old male with symptoms of abdominal pain, nausea and vomiting. He presented to the ER with lactic acidosis to 6, WBC 13, CT scan concerning for SBO.  Description of procedure: The patient was brought into the operative suite. Anesthesia was administered with General endotracheal anesthesia. WHO checklist was applied. The patient was then placed in supine position. The area was prepped and draped in the usual sterile fashion.  Next, a midline incision was made. Cautery was used to dissect down through the subcutaneous fat and the fascia. There were some adhesions of the omentum to the abdominal wall. The bowel was dilated and immediately pushed beyond the skin edge. The ligament of Venita Lick was identified and the small intestine was run from there to the terminal ileum. The small intestine was dilated throughout, there were no areas of ischemia, there was one area that was narrowed temporally but was not a full stricture and dilated up was mobile. In the last 30 cm of the small intestine there was an small bowel anastomosis that appeared at somewhat of an angle and an ileocecal anastomosis. The colon was normal in appearance. The stomach was palpated and the NG was manipulated to be in the body of the stomach. Once appropriately placed a large amount of succus was removed. Additional gas and air was pushed retrograde from the jejunum to the stomach to decrease the bowel distension. The omentum was replaced over top the small intestine. Next, the fascia was closed with 0 PDS in running fashion. Staples were used to appose the skin. Dressing was put in place. The patient tolerated procedure well and was brought to  pacu in stable condition.  Findings: diffusely dilated small intestine without transition point, small amount of adhesions, no ischemia  Specimen: none  Implant: none   Blood loss: 74ml  Local anesthesia: none  Complications: none  Gurney Maxin, M.D. General, Bariatric, & Minimally Invasive Surgery [facility]

## 2017-12-16 ENCOUNTER — Inpatient Hospital Stay (HOSPITAL_COMMUNITY): Payer: Medicare Other

## 2017-12-16 ENCOUNTER — Encounter (HOSPITAL_COMMUNITY): Payer: Self-pay | Admitting: General Surgery

## 2017-12-16 DIAGNOSIS — G9340 Encephalopathy, unspecified: Secondary | ICD-10-CM

## 2017-12-16 DIAGNOSIS — J9600 Acute respiratory failure, unspecified whether with hypoxia or hypercapnia: Secondary | ICD-10-CM

## 2017-12-16 DIAGNOSIS — M7989 Other specified soft tissue disorders: Secondary | ICD-10-CM

## 2017-12-16 LAB — PHOSPHORUS: Phosphorus: 1.3 mg/dL — ABNORMAL LOW (ref 2.5–4.6)

## 2017-12-16 LAB — DIFFERENTIAL
Abs Immature Granulocytes: 0.01 K/uL (ref 0.00–0.07)
Basophils Absolute: 0 K/uL (ref 0.0–0.1)
Basophils Relative: 1 %
Eosinophils Absolute: 0.1 K/uL (ref 0.0–0.5)
Eosinophils Relative: 1 %
Immature Granulocytes: 0 %
Lymphocytes Relative: 22 %
Lymphs Abs: 1.1 K/uL (ref 0.7–4.0)
Monocytes Absolute: 0.7 K/uL (ref 0.1–1.0)
Monocytes Relative: 15 %
Neutro Abs: 3 K/uL (ref 1.7–7.7)
Neutrophils Relative %: 61 %

## 2017-12-16 LAB — COMPREHENSIVE METABOLIC PANEL WITH GFR
ALT: 15 U/L (ref 0–44)
AST: 17 U/L (ref 15–41)
Albumin: 2.3 g/dL — ABNORMAL LOW (ref 3.5–5.0)
Alkaline Phosphatase: 39 U/L (ref 38–126)
Anion gap: 4 — ABNORMAL LOW (ref 5–15)
BUN: 12 mg/dL (ref 6–20)
CO2: 26 mmol/L (ref 22–32)
Calcium: 7.1 mg/dL — ABNORMAL LOW (ref 8.9–10.3)
Chloride: 106 mmol/L (ref 98–111)
Creatinine, Ser: 0.94 mg/dL (ref 0.61–1.24)
GFR calc Af Amer: >60 mL/min
GFR calc non Af Amer: >60 mL/min
Glucose, Bld: 151 mg/dL — ABNORMAL HIGH (ref 70–99)
Potassium: 3.4 mmol/L — ABNORMAL LOW (ref 3.5–5.1)
Sodium: 136 mmol/L (ref 135–145)
Total Bilirubin: 1.1 mg/dL (ref 0.3–1.2)
Total Protein: 4.4 g/dL — ABNORMAL LOW (ref 6.5–8.1)

## 2017-12-16 LAB — CBC
HCT: 33.1 % — ABNORMAL LOW (ref 39.0–52.0)
Hemoglobin: 10.4 g/dL — ABNORMAL LOW (ref 13.0–17.0)
MCH: 29.2 pg (ref 26.0–34.0)
MCHC: 31.4 g/dL (ref 30.0–36.0)
MCV: 93 fL (ref 80.0–100.0)
NRBC: 0 % (ref 0.0–0.2)
PLATELETS: 146 10*3/uL — AB (ref 150–400)
RBC: 3.56 MIL/uL — ABNORMAL LOW (ref 4.22–5.81)
RDW: 14.3 % (ref 11.5–15.5)
WBC: 4.9 10*3/uL (ref 4.0–10.5)

## 2017-12-16 LAB — CK: Total CK: 135 U/L (ref 49–397)

## 2017-12-16 LAB — GLUCOSE, CAPILLARY
GLUCOSE-CAPILLARY: 99 mg/dL (ref 70–99)
Glucose-Capillary: 97 mg/dL (ref 70–99)
Glucose-Capillary: 97 mg/dL (ref 70–99)

## 2017-12-16 LAB — LACTIC ACID, PLASMA
LACTIC ACID, VENOUS: 0.8 mmol/L (ref 0.5–1.9)
Lactic Acid, Venous: 0.7 mmol/L (ref 0.5–1.9)

## 2017-12-16 LAB — PREALBUMIN: PREALBUMIN: 11.7 mg/dL — AB (ref 18–38)

## 2017-12-16 LAB — TRIGLYCERIDES: Triglycerides: 96 mg/dL (ref <150)

## 2017-12-16 LAB — MAGNESIUM: Magnesium: 1.9 mg/dL (ref 1.7–2.4)

## 2017-12-16 MED ORDER — TRAVASOL 10 % IV SOLN
INTRAVENOUS | Status: AC
  Administered 2017-12-16: 18:00:00 via INTRAVENOUS
  Filled 2017-12-16: qty 1080

## 2017-12-16 MED ORDER — MIDAZOLAM HCL 2 MG/2ML IJ SOLN
1.0000 mg | INTRAMUSCULAR | Status: DC | PRN
  Administered 2017-12-16 – 2017-12-17 (×5): 2 mg via INTRAVENOUS
  Administered 2017-12-17 (×2): 4 mg via INTRAVENOUS
  Administered 2017-12-18 – 2017-12-20 (×5): 2 mg via INTRAVENOUS
  Administered 2017-12-20: 3 mg via INTRAVENOUS
  Administered 2017-12-20 (×3): 2 mg via INTRAVENOUS
  Administered 2017-12-21: 3 mg via INTRAVENOUS
  Administered 2017-12-22 (×2): 2 mg via INTRAVENOUS
  Administered 2017-12-22: 4 mg via INTRAVENOUS
  Administered 2017-12-22 (×2): 3 mg via INTRAVENOUS
  Administered 2017-12-23 – 2017-12-28 (×10): 2 mg via INTRAVENOUS
  Administered 2017-12-28: 4 mg via INTRAVENOUS
  Filled 2017-12-16: qty 2
  Filled 2017-12-16: qty 4
  Filled 2017-12-16 (×3): qty 2
  Filled 2017-12-16: qty 4
  Filled 2017-12-16 (×11): qty 2
  Filled 2017-12-16: qty 4
  Filled 2017-12-16 (×3): qty 2

## 2017-12-16 MED ORDER — POTASSIUM CHLORIDE 10 MEQ/50ML IV SOLN
10.0000 meq | INTRAVENOUS | Status: AC
  Administered 2017-12-16 (×3): 10 meq via INTRAVENOUS
  Filled 2017-12-16 (×3): qty 50

## 2017-12-16 MED ORDER — TRAVASOL 10 % IV SOLN
INTRAVENOUS | Status: AC
  Filled 2017-12-16: qty 630

## 2017-12-16 MED ORDER — HEPARIN SODIUM (PORCINE) 5000 UNIT/ML IJ SOLN
5000.0000 [IU] | Freq: Three times a day (TID) | INTRAMUSCULAR | Status: DC
  Administered 2017-12-16 – 2017-12-17 (×3): 5000 [IU] via SUBCUTANEOUS
  Filled 2017-12-16 (×2): qty 1

## 2017-12-16 MED ORDER — DIAZEPAM 5 MG/ML IJ SOLN
2.0000 mg | Freq: Two times a day (BID) | INTRAMUSCULAR | Status: DC
  Administered 2017-12-16 – 2017-12-28 (×25): 2 mg via INTRAVENOUS
  Filled 2017-12-16 (×26): qty 2

## 2017-12-16 MED ORDER — SODIUM PHOSPHATES 45 MMOLE/15ML IV SOLN
30.0000 mmol | Freq: Once | INTRAVENOUS | Status: AC
  Administered 2017-12-16: 30 mmol via INTRAVENOUS
  Filled 2017-12-16: qty 10

## 2017-12-16 MED ORDER — SODIUM CHLORIDE 0.9 % IV SOLN
2.0000 g | Freq: Three times a day (TID) | INTRAVENOUS | Status: DC
  Administered 2017-12-16 (×2): 2 g via INTRAVENOUS
  Filled 2017-12-16 (×5): qty 2

## 2017-12-16 MED ORDER — INSULIN ASPART 100 UNIT/ML ~~LOC~~ SOLN
0.0000 [IU] | SUBCUTANEOUS | Status: DC
  Administered 2017-12-17 (×3): 1 [IU] via SUBCUTANEOUS

## 2017-12-16 NOTE — Progress Notes (Signed)
Initial Nutrition Assessment  DOCUMENTATION CODES:   Obesity unspecified  INTERVENTION:    TPN dosing per Pharmacy to meet nutrition needs as able.  NUTRITION DIAGNOSIS:   Inadequate oral intake related to inability to eat as evidenced by NPO status.  GOAL:   Provide needs based on ASPEN/SCCM guidelines  MONITOR:   Vent status, Labs, I & O's  REASON FOR ASSESSMENT:   Ventilator, Consult New TPN/TNA  ASSESSMENT:   37 yo male with PMH of cerebral palsy, recurrent UTIs, tachycardia, chronic constipation, and HTN who was admitted on 11/17 with SBO. S/P ex lap with lysis of adhesions 11/18.   Patient is currently intubated on ventilator support MV: 7.1 L/min Temp (24hrs), Avg:99.6 F (37.6 C), Min:98.8 F (37.1 C), Max:100.8 F (38.2 C)   TPN was started 11/18 due to history of prolonged ileus after surgery. Lipids being held for 7 days per ASPEN guidelines.  TPN currently at 35 ml/h, increasing to 60 ml/h today to provide 1068 kcal and 108 gm protein daily.  NG tube to suction. 50 ml output overnight.  Labs reviewed. Potassium 3.4 (L), phosphorus 1.3 (L), prealbumin 11.7 (L), triglycerides 96 (WNL) Medications reviewed and include novolog, sodium phosphate.   NUTRITION - FOCUSED PHYSICAL EXAM:    Most Recent Value  Orbital Region  No depletion  Upper Arm Region  No depletion  Thoracic and Lumbar Region  No depletion  Buccal Region  No depletion  Temple Region  No depletion  Clavicle Bone Region  No depletion  Clavicle and Acromion Bone Region  No depletion  Scapular Bone Region  Unable to assess  Dorsal Hand  No depletion  Patellar Region  No depletion  Anterior Thigh Region  No depletion  Posterior Calf Region  No depletion  Edema (RD Assessment)  Mild  Hair  Reviewed  Eyes  Unable to assess  Mouth  Unable to assess  Skin  Reviewed  Nails  Reviewed       Diet Order:   Diet Order            Diet NPO time specified  Diet effective now               EDUCATION NEEDS:   No education needs have been identified at this time  Skin:  Skin Assessment: (MASD to buttocks & thigh)  Last BM:  11/18  Height:   Ht Readings from Last 1 Encounters:  12/15/17 5\' 5"  (1.651 m)    Weight:   Wt Readings from Last 1 Encounters:  12/14/17 90.7 kg    Ideal Body Weight:  61.8 kg  BMI:  Body mass index is 33.28 kg/m.  Estimated Nutritional Needs:   Kcal:  0488-8916  Protein:  124 gm  Fluid:  1.5-2 L    Molli Barrows, RD, LDN, [place] Pager [phone] After Hours Pager [phone]

## 2017-12-16 NOTE — Progress Notes (Signed)
Spoke with Dr. Gloriann Loan Urologist concerning Pseudomonas Aeruginosa >100k colony growth in urine culture with a negative U/A.  After discussing with him given his risk factors for sepsis and neurogenic bladder we decided it would be best to treat for infection.

## 2017-12-16 NOTE — Progress Notes (Signed)
1 Day Post-Op   Subjective/Chief Complaint: Patient required reintubation yesterday for poor oxygen saturations Tachycardia improved On TNA via Central Line in Right IJ No BM recorded Minimal NG output   Objective: Vital signs in last 24 hours: Temp:  [98.9 F (37.2 C)-101.7 F (38.7 C)] 99.6 F (37.6 C) (11/19 0748) Pulse Rate:  [80-130] 80 (11/19 0700) Resp:  [14-23] 14 (11/19 0700) BP: (87-129)/(34-109) 92/60 (11/19 0700) SpO2:  [95 %-100 %] 100 % (11/19 0821) FiO2 (%):  [40 %-60 %] 40 % (11/19 0821) Last BM Date: 12/15/17  Intake/Output from previous day: 11/18 0701 - 11/19 0700 In: 4146.7 [I.V.:2857.1; NG/GT:40; IV Piggyback:1249.6] Out: 350 [Urine:300; Emesis/NG output:50] Intake/Output this shift: No intake/output data recorded.  WDWN, intubated, sedated Edema in all four extremities Abd - quiet, distended, tender to palpation  Lab Results:  Recent Labs    12/15/17 0440 12/16/17 0303  WBC 6.0 4.9  HGB 13.1 10.4*  HCT 41.5 33.1*  PLT 230 146*   BMET Recent Labs    12/15/17 0440 12/16/17 0303  NA 138 136  K 3.8 3.4*  CL 106 106  CO2 24 26  GLUCOSE 154* 151*  BUN 12 12  CREATININE 0.92 0.94  CALCIUM 7.8* 7.1*   PT/INR Recent Labs    12/14/17 1727 12/15/17 0440  LABPROT 25.4* 16.3*  INR 2.35 1.33   ABG Recent Labs    12/14/17 2151 12/15/17 1048  PHART 7.420 7.490*  HCO3 26.4 24.2    Studies/Results: Dg Chest 1 View  Result Date: 12/15/2017 CLINICAL DATA:  37 year old male with history of central line placement. EXAM: CHEST  1 VIEW COMPARISON:  Chest x-ray 12/15/2017. FINDINGS: An endotracheal tube is in place with tip 3.0 cm above the carina. There is a right-sided internal jugular central venous catheter with tip terminating in the superior cavoatrial junction. Nasogastric tube extends into the proximal stomach. Lung volumes are low. Opacity in the left base with blunting of the left hemidiaphragm. Right lung appears clear. No  pneumothorax. No evidence of pulmonary edema. Heart size is normal. The patient is rotated to the left on today's exam, resulting in distortion of the mediastinal contours and reduced diagnostic sensitivity and specificity for mediastinal pathology. IMPRESSION: 1. Support apparatus, as above. 2. Low lung volumes with left basilar opacity which likely reflects a small left pleural effusion as well as left lower lobe atelectasis and/or airspace consolidation. Electronically Signed   By: Vinnie Langton M.D.   On: 12/15/2017 16:47   Ct Abdomen Pelvis W Contrast  Result Date: 12/14/2017 CLINICAL DATA:  Acute abdominal pain, nausea, vomiting. EXAM: CT ABDOMEN AND PELVIS WITH CONTRAST TECHNIQUE: Multidetector CT imaging of the abdomen and pelvis was performed using the standard protocol following bolus administration of intravenous contrast. CONTRAST:  140mL OMNIPAQUE IOHEXOL 300 MG/ML  SOLN COMPARISON:  06/07/2011 FINDINGS: Lower chest: Cardiomegaly.  Bibasilar atelectasis.  No effusions. Hepatobiliary: To gallstones within the gallbladder, the largest 1.8 cm. No focal hepatic abnormality. Pancreas: No focal abnormality or ductal dilatation. Spleen: No focal abnormality.  Normal size. Adrenals/Urinary Tract: No adrenal abnormality. No focal renal abnormality. No stones or hydronephrosis. Urinary bladder is unremarkable. Stomach/Bowel: Large stool burden within the rectosigmoid colon. Dilated small bowel loops in the abdomen and pelvis. The distal ileum is decompressed. Findings compatible with distal small bowel obstruction. Exact cause or transition point is not visualized. Stomach is mildly dilated. Vascular/Lymphatic: No evidence of aneurysm or adenopathy. Reproductive: Mildly prominent prostate. Other: No free fluid or free air.  Musculoskeletal: No acute bony abnormality. IMPRESSION: Dilated small bowel with air-fluid levels to the distal small bowel compatible with distal small bowel obstruction. Exact cause  and transition point not visualized. Large stool burden in the rectosigmoid colon. Mildly prominent prostate. Cholelithiasis. Bibasilar atelectasis. Electronically Signed   By: Rolm Baptise M.D.   On: 12/14/2017 19:02   Dg Chest Port 1 View  Result Date: 12/16/2017 CLINICAL DATA:  Respiratory failure, shortness of breath. EXAM: PORTABLE CHEST 1 VIEW COMPARISON:  Radiograph of December 05, 2017. FINDINGS: Stable cardiomegaly. Endotracheal and nasogastric tubes are unchanged in position. Right internal jugular catheter is unchanged in position. No pneumothorax is noted. Stable bibasilar atelectasis is noted. Bony thorax is unremarkable. IMPRESSION: Stable support apparatus.  Stable bibasilar opacities. Electronically Signed   By: Marijo Conception, M.D.   On: 12/16/2017 07:14   Portable Chest X-ray  Result Date: 12/15/2017 CLINICAL DATA:  Ventilator patient. EXAM: PORTABLE CHEST 1 VIEW COMPARISON:  12/14/2017 and 11/14/2011. FINDINGS: 0934 hours. Interval intubation. The tip of the endotracheal tube is approximately 1.8 cm above the carina. Enteric tube projects into the stomach. There is overall improved aeration of the lung bases with mild left-greater-than-right probable atelectasis. No pleural effusion or pneumothorax. Heart size and mediastinal contours are stable. There is a convex right thoracic scoliosis. IMPRESSION: Endotracheal tube tip 1.8 cm above the carina. This could be retracted 2 cm for more optimal positioning. Enteric tube extends into the stomach. Interval improved aeration of the lung bases with probable left-greater-than-right basilar atelectasis. Electronically Signed   By: Richardean Sale M.D.   On: 12/15/2017 09:51   Dg Chest Port 1 View  Result Date: 12/14/2017 CLINICAL DATA:  Tachypnea EXAM: PORTABLE CHEST 1 VIEW COMPARISON:  11/14/2011 FINDINGS: Cardiomegaly. Vascular congestion. Very low lung volumes with bibasilar atelectasis. No definite visible effusions or acute bony  abnormality. IMPRESSION: Very low lung volumes with cardiomegaly, vascular congestion and bibasilar atelectasis. Electronically Signed   By: Rolm Baptise M.D.   On: 12/14/2017 18:14   Dg Abd Portable 1v  Result Date: 12/15/2017 CLINICAL DATA:  NG tube placement EXAM: PORTABLE ABDOMEN - 1 VIEW COMPARISON:  None. FINDINGS: Enteric tube is been placed. Tip is at the expected location of the EG junction, likely in the distal esophagus. Gaseous distention of visualized small bowel. Skin clips along the midline consistent with recent surgery. Calcifications in the right upper quadrant likely gallstones. IMPRESSION: Enteric tube tip is at the expected location of the EG junction, likely in the distal esophagus. Electronically Signed   By: Lucienne Capers M.D.   On: 12/15/2017 05:50    Anti-infectives: Anti-infectives (From admission, onward)   Start     Dose/Rate Route Frequency Ordered Stop   12/15/17 0300  vancomycin (VANCOCIN) IVPB 1000 mg/200 mL premix  Status:  Discontinued     1,000 mg 200 mL/hr over 60 Minutes Intravenous Every 8 hours 12/14/17 1844 12/15/17 1133   12/14/17 1900  vancomycin (VANCOCIN) 1,750 mg in sodium chloride 0.9 % 500 mL IVPB     1,750 mg 250 mL/hr over 120 Minutes Intravenous  Once 12/14/17 1844 12/14/17 2343   12/14/17 1845  ceFEPIme (MAXIPIME) 2 g in sodium chloride 0.9 % 100 mL IVPB  Status:  Discontinued     2 g 200 mL/hr over 30 Minutes Intravenous Every 8 hours 12/14/17 1844 12/15/17 1133   12/14/17 1800  ceFEPIme (MAXIPIME) 2 g in sodium chloride 0.9 % 100 mL IVPB  Status:  Discontinued  2 g 200 mL/hr over 30 Minutes Intravenous  Once 12/14/17 1753 12/14/17 1844   12/14/17 1800  metroNIDAZOLE (FLAGYL) IVPB 500 mg  Status:  Discontinued     500 mg 100 mL/hr over 60 Minutes Intravenous Every 8 hours 12/14/17 1753 12/15/17 1133   12/14/17 1800  vancomycin (VANCOCIN) IVPB 1000 mg/200 mL premix  Status:  Discontinued     1,000 mg 200 mL/hr over 60 Minutes  Intravenous  Once 12/14/17 1753 12/14/17 1844      Assessment/Plan: Abdominal pain/ CT findings of SBO/ lactic acidosis of 6 S/p exploratory laparotomy with lysis of adhesions, but no clear transition point.  Lactate normalized WBC normal  VDRF per CCM Await bowel function - continue NG tube On TNA since previous surgeries have always resulted in very long post-operative ileus High risk for skin breakdown - usually placed on airbed early in admission.  Has had previous plastic surgery flap for previous pressure ulcers   LOS: 2 days    Maia Petties 12/16/2017

## 2017-12-16 NOTE — Anesthesia Postprocedure Evaluation (Signed)
Anesthesia Post Note  Patient: [name]  Procedure(s) Performed: EXPLORATORY LAPAROTOMY (N/A )     Patient location during evaluation: PACU Anesthesia Type: General Level of consciousness: sedated and patient cooperative Pain management: pain level controlled Vital Signs Assessment: post-procedure vital signs reviewed and stable Respiratory status: spontaneous breathing Cardiovascular status: stable Anesthetic complications: no    Last Vitals:  Vitals:   12/16/17 1242 12/16/17 1507  BP:    Pulse:    Resp:    Temp:  (!) 38.1 C  SpO2: 100%     Last Pain:  Vitals:   12/16/17 1507  TempSrc: Axillary  PainSc:                  Nolon Nations

## 2017-12-16 NOTE — Progress Notes (Signed)
NAME:  [name], MRN:  [id number], DOB:  July 05, 1980, LOS: 2 ADMISSION DATE:  12/14/2017, CONSULTATION DATE:  12/15/2017 REFERRING MD:  N/A, CHIEF COMPLAINT:  Abdominal pain with n/v  Brief History   [name] is a 37y/o male with PMH of Cerebral palsy, DVT on coumadin, HTN, and chronic constipation. He has also had a history of skin ulceration and breakdown.  Past Medical History  Cerebral palsy, SBO s/p exploratory lap, DVT on coumadin, HTN   Significant Hospital Events   11/18 - Exploratory Laparotomy with lysis of adhesions 11/18 - Desaturation into 50s and initially unresponsive, now intubated and sedated 11/18 - Central Line placed, TPN started  Consults:  General Surgery  Procedures:  Exploratory lapartomy  Significant Diagnostic Tests:  CT Abd: Dilated small bowel with air-fluid levels to the distal small bowel compatible with distal small bowel obstruction.  Micro Data:  BCx: 1/2 positive for Staph Aureus; likely contaminate cont to monitor Urine Cx: >100k Pseudomonas Aeruginosa MRSA screen: neg  Antimicrobials:  None  Interim history/subjective:  Patient remains sedated but will awaken to verbal commands. He is not speaking but can acknowledge he understands with moving his head. Easily goes back to sleep.  Objective   Blood pressure 92/60, pulse 80, temperature 99.6 F (37.6 C), temperature source Oral, resp. rate 14, height 5\' 5"  (1.651 m), weight 90.7 kg, SpO2 100 %. CVP:  [9 mmHg-16 mmHg] 16 mmHg  Vent Mode: PRVC FiO2 (%):  [40 %-100 %] 40 % Set Rate:  [14 bmp-18 bmp] 14 bmp Vt Set:  [490 mL] 490 mL PEEP:  [5 cmH20] 5 cmH20 Plateau Pressure:  [19 cmH20-23 cmH20] 22 cmH20   Intake/Output Summary (Last 24 hours) at 12/16/2017 0749 Last data filed at 12/16/2017 0700 Gross per 24 hour  Intake 4146.68 ml  Output 350 ml  Net 3796.68 ml   Filed Weights   12/14/17 1823  Weight: 90.7 kg    Examination: General: sedated, awakens to  verbal commands HENT: pupils reactive, ETT in place, NG tube in place Lungs: CTAB, no rhonchi or wheezing Cardiovascular: RRR, no murmurs Abdomen: soft, non-tender, + bowel sounds, midline bandage clean and in place Extremities: trace edema on LLE, no cyanosis Neuro: RASS  Score 0 to -1   Resolved Hospital Problem list   Lactic Acidosis  Assessment & Plan:   Acute Hypoxic Respiratory Failure - Stable on vent. Still unclear etiology but likely multifactorial due to narcotics, aspiration, and post surgical respiratory depression - Daily monitoring for SBT - Remains agitated   Small Bowel Obstruction s/p Exploratory Laparotomy - General Surgery following; continue NG tube - Started TNA on 11/18; dosed per pharm - Cont airbed due to complications from skin ulcers and tissue breakdown from prior admissions - Cont D51/2NS @ 51ml/hr  Cerebral Palsy with chronic spasticity - Holding home meds: baclofen, flexeril, oral valium, zanaflex - Robaxin IV q8hrs - Ativan prn  DVT on chronic coumadin  - Start Heparin today to transition to warfarin - Follow INR; transition to heparin then bridge to coumadin after 24 hours post-op  HTN - Holding home meds metoprolol and HCTZ - Stable for now, IV Lopressor for  SBP >170  Paroxymsal Atrial Fibrilation - Holding metoprolol for now, continue to monitor heart rate, IV Lopressor for HR > 120  Best practice:  Diet: NPO, NG tube in place Pain/Anxiety/Delirium protocol (if indicated): Ativan prn VAP protocol (if indicated): Initiated on 11/18 DVT prophylaxis: SDCs for now -> heparin to warfarin bridge  GI prophylaxis: Pepcid 20mg  BID Mobility: bed/wheelchair bound Code Status: full Family Communication: Sister is HCPOA Disposition: ICU  Labs   CBC: Recent Labs  Lab 12/14/17 1727 12/15/17 0440 12/16/17 0303  WBC 13.3* 6.0 4.9  NEUTROABS 11.9*  --  3.0  HGB 15.5 13.1 10.4*  HCT 48.0 41.5 33.1*  MCV 90.4 90.4 93.0  PLT 241 230 146*     Basic Metabolic Panel: Recent Labs  Lab 12/14/17 1727 12/14/17 1939 12/15/17 0440 12/16/17 0303  NA 140  --  138 136  K 3.4*  --  3.8 3.4*  CL 98  --  106 106  CO2 30  --  24 26  GLUCOSE 153*  --  154* 151*  BUN 16  --  12 12  CREATININE 1.14  --  0.92 0.94  CALCIUM 9.6  --  7.8* 7.1*  MG  --  2.0 1.5* 1.9  PHOS  --   --   --  1.3*   GFR: Estimated Creatinine Clearance: 110.3 mL/min (by C-G formula based on SCr of 0.94 mg/dL). Recent Labs  Lab 12/14/17 1727 12/14/17 1736 12/14/17 1939 12/14/17 1945 12/14/17 2240 12/15/17 0440 12/15/17 0444 12/16/17 0303  PROCALCITON  --   --  0.77  --   --   --   --   --   WBC 13.3*  --   --   --   --  6.0  --  4.9  LATICACIDVEN  --  6.50*  --  5.92* 3.01*  --  1.9  --     Liver Function Tests: Recent Labs  Lab 12/14/17 1727 12/16/17 0303  AST 35 17  ALT 28 15  ALKPHOS 65 39  BILITOT 1.4* 1.1  PROT 7.3 4.4*  ALBUMIN 3.9 2.3*   Recent Labs  Lab 12/14/17 1727  LIPASE 23   No results for input(s): AMMONIA in the last 168 hours.  ABG    Component Value Date/Time   PHART 7.490 (H) 12/15/2017 1048   PCO2ART 31.7 (L) 12/15/2017 1048   PO2ART 204.0 (H) 12/15/2017 1048   HCO3 24.2 12/15/2017 1048   TCO2 25 12/15/2017 1048   O2SAT 100.0 12/15/2017 1048     Coagulation Profile: Recent Labs  Lab 12/14/17 1727 12/15/17 0440  INR 2.35 1.33    Cardiac Enzymes: No results for input(s): CKTOTAL, CKMB, CKMBINDEX, TROPONINI in the last 168 hours.  HbA1C: No results found for: HGBA1C  CBG: Recent Labs  Lab 12/15/17 0342  GLUCAP 123*    Review of Systems:   Unable to obtain ROS as patient is intubated and sedated  Past Medical History  He,  has a past medical history of Cerebral palsy ([place]), Chronic constipation, Coagulopathy ([place]), Hypertension, Insomnia, Recurrent UTI, and Tachycardia.   Surgical History    Past Surgical History:  Procedure Laterality Date  . ELBOW SURGERY  09/12/10   left  . INCISE  AND DRAIN ABCESS  09/11/10  . SMALL INTESTINE SURGERY    . TOOTH EXTRACTION  11/22/2011   Procedure: DENTAL RESTORATION/EXTRACTIONS;  Surgeon: Marcelo Baldy, MD;  Location: [facility];  Service: Dentistry;  Laterality: N/A;  dental restoration and cleaning; NO EXTRACTIONS     Social History   reports that he has never smoked. He has never used smokeless tobacco. He reports that he does not drink alcohol or use drugs.   Family History   His Family history is unknown by patient.   Allergies Allergies  Allergen Reactions  . Betadine [Povidone  Iodine] Other (See Comments)    Unknown reaction per caregiver  . Ciprofloxacin Other (See Comments)    Unknown reaction per caregiver  . Morphine Other (See Comments)    Unknown reaction per caregiver     Home Medications  Prior to Admission medications   Medication Sig Start Date End Date Taking? Authorizing Provider  acetaminophen (TYLENOL) 325 MG tablet Take 650 mg by mouth every 6 (six) hours as needed. For pain   Yes [provider]  baclofen (LIORESAL) 20 MG tablet Take 1 tablet (20 mg total) by mouth 4 (four) times daily. 07/22/17  Yes Dennie Bible, NP  calcium carbonate (TUMS - DOSED IN MG ELEMENTAL CALCIUM) 500 MG chewable tablet Chew 1 tablet by mouth 3 (three) times daily.   Yes [provider]  cetirizine (ZYRTEC) 10 MG tablet Take 10 mg by mouth daily.   Yes [provider]  cholecalciferol (VITAMIN D) 1000 UNITS tablet Take 1,000 Units by mouth 2 (two) times daily.   Yes [provider]  cyclobenzaprine (FLEXERIL) 10 MG tablet 1 at 8am and 12pm and 2 tabs at 8pm. 07/22/17  Yes Dennie Bible, NP  diazepam (VALIUM) 2 MG tablet Take 1 tablet (2 mg total) by mouth 2 (two) times daily. Must last 30 days. 07/22/17  Yes Dennie Bible, NP  diphenhydrAMINE (BENADRYL) 25 MG tablet Take 25 mg by mouth every 6 (six) hours as needed for allergies.   Yes [provider]  fluticasone  (VERAMYST) 27.5 MCG/SPRAY nasal spray Place 1 spray into the nose at bedtime as needed for rhinitis.   Yes [provider]  hydrochlorothiazide (HYDRODIURIL) 25 MG tablet Take 12.5 mg by mouth daily.    Yes [provider]  loperamide (IMODIUM) 2 MG capsule Take 2 mg by mouth 3 (three) times daily as needed for diarrhea or loose stools. Reported on 05/11/2015   Yes [provider]  metoprolol (TOPROL-XL) 100 MG 24 hr tablet Take 100 mg by mouth daily.    Yes [provider]  mineral oil enema Place 1 enema rectally as needed for severe constipation.   Yes [provider]  Multiple Vitamin (MULITIVITAMIN WITH MINERALS) TABS Take 1 tablet by mouth daily.   Yes [provider]  nitrofurantoin (MACRODANTIN) 50 MG capsule Take 1 capsule (50 mg total) by mouth daily. 05/23/15  Yes Thurnell Lose, MD  senna-docusate (SENOKOT-S) 8.6-50 MG tablet Take 3 tablets by mouth at bedtime.   Yes [provider]  tizanidine (ZANAFLEX) 2 MG capsule Take 1 capsule (2 mg total) by mouth 3 (three) times daily. 07/22/17  Yes Dennie Bible, NP  warfarin (COUMADIN) 3 MG tablet Take 3 mg by mouth daily at 6 PM.    Yes [provider]     Critical care time: >30 minutes    Harolyn Rutherford, DO [place], PGY-2

## 2017-12-16 NOTE — Progress Notes (Addendum)
LE venous duplex prelim: negative for DVT in visualized veins.  Shequilla Goodgame Eunice, RDMS, RVT  

## 2017-12-16 NOTE — Progress Notes (Signed)
Patient without UOP overnight and bladder scan with increasing retention.  Urology consulted. Spoke with Dr. Gloriann Loan who will see patient.  Urology cart requested and at bedside.   Kennieth Rad, AGACNP-BC [facility] Pgr: [phone] or if no answer [phone] 12/16/2017, 7:18 AM

## 2017-12-16 NOTE — Progress Notes (Signed)
[facility] Progress Note Patient Name: [name] DOB: December 06, 1980 MRN: [id number]   Date of Service  12/16/2017  HPI/Events of Note  K+ = 3.4 and Creatinine = 0.94.  eICU Interventions  Will replace K+.      Intervention Category Major Interventions: Electrolyte abnormality - evaluation and management  [name] 12/16/2017, 4:27 AM

## 2017-12-16 NOTE — Progress Notes (Signed)
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 Will continue to bladder scan and monitor for urine output.

## 2017-12-16 NOTE — Progress Notes (Signed)
PHARMACY - ADULT TOTAL PARENTERAL NUTRITION CONSULT NOTE   Pharmacy Consult for TPN Indication: Nutrition s/p surgery for small bowel obstruction  Patient Measurements: Height: 5\' 5"  (165.1 cm) Weight: 200 lb (90.7 kg) IBW/kg (Calculated) : 61.5  AdjBW = 68.8 kg   Body mass index is 33.28 kg/m. Usual Weight: 90.7 kg  Assessment:  Patient admitted 11/17 by ED p/w abdominal pain, N/V, lactic acidosis to 6, WBC 13, CT scan supportive of SBO. Pt required immediate surgery. 11/18 0208 s/p surgery for SBO; found diffusely dilated small intestine without transition point, small amount of adhesions, no ischemia. Patient extubated in OR, transferred to 94M,  intubated due to insufficient oxygenation at 0918. We are starting TPN because historically patient has had long post-op illeuses.  GI: s.p ExLap with LoA on 11/18; albumin 3.9 on admit. Pre-albumin 11.7. NG o/p 50 mL Endo: No h/o DM, BG <180 Insulin requirements in the past 24 hours: 0 u Lytes: Mg 1.9; Ca2+ 8.1, K+ 3.4 (repleted w/ 3 runs of KCl), Phos 1.3  Renal: Now with no UOP and bladder scan with increasing retention. Urology placed foley with 600 mL out, SCr 0.94. +5.8 L since admission  Pulm: Intubated, PRVC, FiO2 100%, PEEP 5 Cards: MAP >65, no pressors, NSR, BP OK, HR 120s Hepatobil: LFTs wnl, TG 96  Neuro: PAD to RASS -2, baseline cerebal palsy, requires chronic antispasmodics  ID: WBC down to 4.9, antibiotics d/c'ed due to low suspicion for infection Heme: Pt on warfarin PTA for hx DVTs, plan to resume warfarin today. INR 2.35 admit > 1.33   TPN Access: PICC 11/18>> TPN start date: 11/18 Nutritional Goals (awaiting RD assesment): KCal: 1,200 kcal/d (per 11-14 kCal/kg/d x 90.7, BMI 33)  Protein: 120 g (2 g/kg/d IBW for BMI 33) Fluid: 2914 mL   Current Nutrition:  TPN @ 35 mL/hr   Plan:  Increase TPN to 60 mL/hr (eventual goal 70 mL/hr). Hold 20% lipid emulsion for first 7 days for ICU patients per ASPEN guidelines  (Lipid start date 11/25) This TPN provides 108 g of protein, 187 g of dextrose, and 0 g of lipids which provides 1068 kCals per day, meeting 89% of patient needs. Electrolytes in TPN: Cl:Ac 1:1, Increase Phos  Add MVI, trace elements and famotidine to TPN  Currently no insulin requirements, continue to monitor BG  Initiate sensitive SSI with increasing TPN rate  IVMF D5W with 1/2NS with 20 mEq KCl at 70 ml/hr will be reduced to 40 mL/h once TPN rate is increased Monitor TPN labs, Phos, Mg, BMP.  Albertina Parr, PharmD., BCPS Clinical Pharmacist Clinical phone for 12/16/17 until 3:30pm: [phone] If after 3:30pm, please refer to [facility] for unit-specific pharmacist

## 2017-12-16 NOTE — Consult Note (Signed)
H&P Physician requesting consult: Brand Males, MD  Chief Complaint: Difficult Foley catheter  History of Present Illness: 37 year old male reportedly has a neurogenic bladder.  He is currently intubated and sedated.  A Foley catheter attempted to be placed but could not be placed due to meatal stenosis.  I was consulted.  Medical history per chart review since the patient is intubated and sedated and no caregiver was available at the time of assessment.  Past Medical History:  Diagnosis Date  . Cerebral palsy ([place])   . Chronic constipation   . Coagulopathy ([place])   . Hypertension   . Insomnia   . Recurrent UTI   . Tachycardia    Past Surgical History:  Procedure Laterality Date  . ELBOW SURGERY  09/12/10   left  . INCISE AND DRAIN ABCESS  09/11/10  . LAPAROTOMY N/A 12/15/2017   Procedure: EXPLORATORY LAPAROTOMY;  Surgeon: Kieth Brightly Arta Bruce, MD;  Location: [facility];  Service: General;  Laterality: N/A;  . SMALL INTESTINE SURGERY    . TOOTH EXTRACTION  11/22/2011   Procedure: DENTAL RESTORATION/EXTRACTIONS;  Surgeon: Marcelo Baldy, MD;  Location: [facility];  Service: Dentistry;  Laterality: N/A;  dental restoration and cleaning; NO EXTRACTIONS    Home Medications:  Medications Prior to Admission  Medication Sig Dispense Refill Last Dose  . acetaminophen (TYLENOL) 325 MG tablet Take 650 mg by mouth every 6 (six) hours as needed. For pain   unk  . baclofen (LIORESAL) 20 MG tablet Take 1 tablet (20 mg total) by mouth 4 (four) times daily. 360 each 3 12/14/2017 at Unknown time  . calcium carbonate (TUMS - DOSED IN MG ELEMENTAL CALCIUM) 500 MG chewable tablet Chew 1 tablet by mouth 3 (three) times daily.   12/14/2017 at Unknown time  . cetirizine (ZYRTEC) 10 MG tablet Take 10 mg by mouth daily.   12/14/2017 at Unknown time  . cholecalciferol (VITAMIN D) 1000 UNITS tablet Take 1,000 Units by mouth 2 (two) times daily.   12/14/2017 at Unknown time  . cyclobenzaprine (FLEXERIL) 10 MG tablet 1  at 8am and 12pm and 2 tabs at 8pm. 360 tablet 3 12/14/2017 at Unknown time  . diazepam (VALIUM) 2 MG tablet Take 1 tablet (2 mg total) by mouth 2 (two) times daily. Must last 30 days. 60 tablet 5 12/14/2017 at Unknown time  . diphenhydrAMINE (BENADRYL) 25 MG tablet Take 25 mg by mouth every 6 (six) hours as needed for allergies.   unk  . fluticasone (VERAMYST) 27.5 MCG/SPRAY nasal spray Place 1 spray into the nose at bedtime as needed for rhinitis.   prn  . hydrochlorothiazide (HYDRODIURIL) 25 MG tablet Take 12.5 mg by mouth daily.    12/14/2017 at Unknown time  . loperamide (IMODIUM) 2 MG capsule Take 2 mg by mouth 3 (three) times daily as needed for diarrhea or loose stools. Reported on 05/11/2015   prn  . metoprolol (TOPROL-XL) 100 MG 24 hr tablet Take 100 mg by mouth daily.    12/14/2017 at 0800  . mineral oil enema Place 1 enema rectally as needed for severe constipation.   prn  . Multiple Vitamin (MULITIVITAMIN WITH MINERALS) TABS Take 1 tablet by mouth daily.   12/14/2017 at Unknown time  . nitrofurantoin (MACRODANTIN) 50 MG capsule Take 1 capsule (50 mg total) by mouth daily.   12/14/2017 at Unknown time  . senna-docusate (SENOKOT-S) 8.6-50 MG tablet Take 3 tablets by mouth at bedtime.   12/13/2017 at Unknown time  . tizanidine (ZANAFLEX)  2 MG capsule Take 1 capsule (2 mg total) by mouth 3 (three) times daily. 270 capsule 3 12/14/2017 at Unknown time  . warfarin (COUMADIN) 3 MG tablet Take 3 mg by mouth daily at 6 PM.    12/13/2017 at Unknown time   Allergies:  Allergies  Allergen Reactions  . Betadine [Povidone Iodine] Other (See Comments)    Unknown reaction per caregiver  . Ciprofloxacin Other (See Comments)    Unknown reaction per caregiver  . Morphine Other (See Comments)    Unknown reaction per caregiver    Family History  Family history unknown: Yes   Social History:  reports that he has never smoked. He has never used smokeless tobacco. He reports that he does not drink  alcohol or use drugs.  ROS: A complete review of systems was performed.  All systems are negative except for pertinent findings as noted. ROS   Physical Exam:  Vital signs in last 24 hours: Temp:  [98.8 F (37.1 C)-100.8 F (38.2 C)] 100.6 F (38.1 C) (11/19 1507) Pulse Rate:  [80-111] 89 (11/19 1230) Resp:  [14-19] 14 (11/19 1230) BP: (87-122)/(34-83) 98/62 (11/19 1230) SpO2:  [95 %-100 %] 100 % (11/19 1550) FiO2 (%):  [40 %-50 %] 40 % (11/19 1550) General: Intubated and sedated  HEENT: Normocephalic, atraumatic Neck: No JVD or lymphadenopathy Cardiovascular: Regular rate and rhythm Lungs: On the ventilator Abdomen: Mildly distended. Into urinary: Bilateral testicles normal.  Patient is circumcised with meatal stenosis Back: No CVA tenderness Extremities: No edema Neurologic: Grossly intact  Laboratory Data:  Results for orders placed or performed during the hospital encounter of 12/14/17 (from the past 24 hour(s))  Comprehensive metabolic panel     Status: Abnormal   Collection Time: 12/16/17  3:03 AM  Result Value Ref Range   Sodium 136 135 - 145 mmol/L   Potassium 3.4 (L) 3.5 - 5.1 mmol/L   Chloride 106 98 - 111 mmol/L   CO2 26 22 - 32 mmol/L   Glucose, Bld 151 (H) 70 - 99 mg/dL   BUN 12 6 - 20 mg/dL   Creatinine, Ser 0.94 0.61 - 1.24 mg/dL   Calcium 7.1 (L) 8.9 - 10.3 mg/dL   Total Protein 4.4 (L) 6.5 - 8.1 g/dL   Albumin 2.3 (L) 3.5 - 5.0 g/dL   AST 17 15 - 41 U/L   ALT 15 0 - 44 U/L   Alkaline Phosphatase 39 38 - 126 U/L   Total Bilirubin 1.1 0.3 - 1.2 mg/dL   GFR calc non Af Amer >60 >60 mL/min   GFR calc Af Amer >60 >60 mL/min   Anion gap 4 (L) 5 - 15  Prealbumin     Status: Abnormal   Collection Time: 12/16/17  3:03 AM  Result Value Ref Range   Prealbumin 11.7 (L) 18 - 38 mg/dL  Magnesium     Status: None   Collection Time: 12/16/17  3:03 AM  Result Value Ref Range   Magnesium 1.9 1.7 - 2.4 mg/dL  Phosphorus     Status: Abnormal   Collection Time:  12/16/17  3:03 AM  Result Value Ref Range   Phosphorus 1.3 (L) 2.5 - 4.6 mg/dL  Triglycerides     Status: None   Collection Time: 12/16/17  3:03 AM  Result Value Ref Range   Triglycerides 96 <150 mg/dL  CBC     Status: Abnormal   Collection Time: 12/16/17  3:03 AM  Result Value Ref Range   WBC 4.9  4.0 - 10.5 K/uL   RBC 3.56 (L) 4.22 - 5.81 MIL/uL   Hemoglobin 10.4 (L) 13.0 - 17.0 g/dL   HCT 33.1 (L) 39.0 - 52.0 %   MCV 93.0 80.0 - 100.0 fL   MCH 29.2 26.0 - 34.0 pg   MCHC 31.4 30.0 - 36.0 g/dL   RDW 14.3 11.5 - 15.5 %   Platelets 146 (L) 150 - 400 K/uL   nRBC 0.0 0.0 - 0.2 %  Differential     Status: None   Collection Time: 12/16/17  3:03 AM  Result Value Ref Range   Neutrophils Relative % 61 %   Neutro Abs 3.0 1.7 - 7.7 K/uL   Lymphocytes Relative 22 %   Lymphs Abs 1.1 0.7 - 4.0 K/uL   Monocytes Relative 15 %   Monocytes Absolute 0.7 0.1 - 1.0 K/uL   Eosinophils Relative 1 %   Eosinophils Absolute 0.1 0.0 - 0.5 K/uL   Basophils Relative 1 %   Basophils Absolute 0.0 0.0 - 0.1 K/uL   Immature Granulocytes 0 %   Abs Immature Granulocytes 0.01 0.00 - 0.07 K/uL  Glucose, capillary     Status: None   Collection Time: 12/16/17 11:09 AM  Result Value Ref Range   Glucose-Capillary 97 70 - 99 mg/dL   Comment 1 Capillary Specimen   Lactic acid, plasma     Status: None   Collection Time: 12/16/17 11:15 AM  Result Value Ref Range   Lactic Acid, Venous 0.8 0.5 - 1.9 mmol/L  CK     Status: None   Collection Time: 12/16/17 11:15 AM  Result Value Ref Range   Total CK 135 49 - 397 U/L  Glucose, capillary     Status: None   Collection Time: 12/16/17  3:07 PM  Result Value Ref Range   Glucose-Capillary 97 70 - 99 mg/dL   Comment 1 Capillary Specimen   Lactic acid, plasma     Status: None   Collection Time: 12/16/17  3:27 PM  Result Value Ref Range   Lactic Acid, Venous 0.7 0.5 - 1.9 mmol/L   Recent Results (from the past 240 hour(s))  Blood Culture (routine x 2)     Status:  Abnormal (Preliminary result)   Collection Time: 12/14/17  5:20 PM  Result Value Ref Range Status   Specimen Description BLOOD RIGHT ANTECUBITAL  Final   Special Requests   Final    BOTTLES DRAWN AEROBIC AND ANAEROBIC Blood Culture results may not be optimal due to an inadequate volume of blood received in culture bottles   Culture  Setup Time   Final    GRAM POSITIVE COCCI IN BOTH AEROBIC AND ANAEROBIC BOTTLES CRITICAL RESULT CALLED TO, READ BACK BY AND VERIFIED WITH: L. Florene Glen PharmD 17:00 12/15/17 ([email or web address])    Culture (A)  Final    STAPHYLOCOCCUS SPECIES (COAGULASE NEGATIVE) THE SIGNIFICANCE OF ISOLATING THIS ORGANISM FROM A SINGLE SET OF BLOOD CULTURES WHEN MULTIPLE SETS ARE DRAWN IS UNCERTAIN. PLEASE NOTIFY THE MICROBIOLOGY DEPARTMENT WITHIN ONE WEEK IF SPECIATION AND SENSITIVITIES ARE REQUIRED. Performed at [facility], [place] [address] [place], [place] [address]    Report Status PENDING  Incomplete  Blood Culture ID Panel (Reflexed)     Status: Abnormal   Collection Time: 12/14/17  5:20 PM  Result Value Ref Range Status   Enterococcus species NOT DETECTED NOT DETECTED Final   Listeria monocytogenes NOT DETECTED NOT DETECTED Final   Staphylococcus species DETECTED (A) NOT DETECTED Final  Comment: Methicillin (oxacillin) resistant coagulase negative staphylococcus. Possible blood culture contaminant (unless isolated from more than one blood culture draw or clinical case suggests pathogenicity). No antibiotic treatment is indicated for blood  culture contaminants. CRITICAL RESULT CALLED TO, READ BACK BY AND VERIFIED WITH: L. Florene Glen PharmD 17:00 12/15/17 ([email or web address])    Staphylococcus aureus (BCID) NOT DETECTED NOT DETECTED Final   Methicillin resistance DETECTED (A) NOT DETECTED Final    Comment: CRITICAL RESULT CALLED TO, READ BACK BY AND VERIFIED WITH: L. Florene Glen PharmD 17:00 12/15/17 ([email or web address])    Streptococcus species NOT DETECTED NOT DETECTED Final   Streptococcus  agalactiae NOT DETECTED NOT DETECTED Final   Streptococcus pneumoniae NOT DETECTED NOT DETECTED Final   Streptococcus pyogenes NOT DETECTED NOT DETECTED Final   Acinetobacter baumannii NOT DETECTED NOT DETECTED Final   Enterobacteriaceae species NOT DETECTED NOT DETECTED Final   Enterobacter cloacae complex NOT DETECTED NOT DETECTED Final   Escherichia coli NOT DETECTED NOT DETECTED Final   Klebsiella oxytoca NOT DETECTED NOT DETECTED Final   Klebsiella pneumoniae NOT DETECTED NOT DETECTED Final   Proteus species NOT DETECTED NOT DETECTED Final   Serratia marcescens NOT DETECTED NOT DETECTED Final   Haemophilus influenzae NOT DETECTED NOT DETECTED Final   Neisseria meningitidis NOT DETECTED NOT DETECTED Final   Pseudomonas aeruginosa NOT DETECTED NOT DETECTED Final   Candida albicans NOT DETECTED NOT DETECTED Final   Candida glabrata NOT DETECTED NOT DETECTED Final   Candida krusei NOT DETECTED NOT DETECTED Final   Candida parapsilosis NOT DETECTED NOT DETECTED Final   Candida tropicalis NOT DETECTED NOT DETECTED Final    Comment: Performed at [facility], [place] [address] [place], [place] [address]  Blood Culture (routine x 2)     Status: None (Preliminary result)   Collection Time: 12/14/17  5:55 PM  Result Value Ref Range Status   Specimen Description BLOOD RIGHT FOREARM  Final   Special Requests   Final    BOTTLES DRAWN AEROBIC AND ANAEROBIC Blood Culture adequate volume   Culture   Final    NO GROWTH 2 DAYS Performed at [facility], [place] [address], [place], [place] [address]    Report Status PENDING  Incomplete  Urine culture     Status: Abnormal (Preliminary result)   Collection Time: 12/14/17  7:48 PM  Result Value Ref Range Status   Specimen Description URINE, RANDOM  Final   Special Requests   Final    NONE Performed at [facility], [place] [address] [place], [place] [address]    Culture >=100,000 COLONIES/mL PSEUDOMONAS AERUGINOSA (A)  Final    Report Status PENDING  Incomplete  MRSA PCR Screening     Status: None   Collection Time: 12/15/17  3:56 AM  Result Value Ref Range Status   MRSA by PCR NEGATIVE NEGATIVE Final    Comment:        The GeneXpert MRSA Assay (FDA approved for NASAL specimens only), is one component of a comprehensive MRSA colonization surveillance program. It is not intended to diagnose MRSA infection nor to guide or monitor treatment for MRSA infections. Performed at [facility], [place] [address] [place], [place] [address]   Blood culture (routine single)     Status: None (Preliminary result)   Collection Time: 12/15/17  4:40 AM  Result Value Ref Range Status   Specimen Description BLOOD RIGHT HAND  Final   Special Requests   Final    BOTTLES DRAWN AEROBIC  ONLY Blood Culture adequate volume   Culture   Final    NO GROWTH 1 DAY Performed at [facility], [place] [address] [place], [place] [address]    Report Status PENDING  Incomplete   Creatinine: Recent Labs    12/14/17 1727 12/15/17 0440 12/16/17 0303  CREATININE 1.14 0.92 0.94   Procedure: Under sterile conditions, the urethral meatus was sequentially dilated with sounds from 12 [place] up to 20 [place].  Under sterile conditions, a Foley catheter 76 [place] was then easily passed into the bladder with return of light red urine that soon cleared up.   Impression/Assessment:  Difficult Foley catheter placement Meatal stenosis Hematuria  Plan:  Keep Foley catheter until patient recovers.  Hematuria can be observed as it was likely traumatic in nature.  Marton Redwood, III 12/16/2017, 4:31 PM

## 2017-12-17 ENCOUNTER — Inpatient Hospital Stay (HOSPITAL_COMMUNITY): Payer: Medicare Other

## 2017-12-17 DIAGNOSIS — M62838 Other muscle spasm: Secondary | ICD-10-CM

## 2017-12-17 DIAGNOSIS — R569 Unspecified convulsions: Secondary | ICD-10-CM

## 2017-12-17 LAB — BASIC METABOLIC PANEL
ANION GAP: 3 — AB (ref 5–15)
BUN: 11 mg/dL (ref 6–20)
CALCIUM: 6.9 mg/dL — AB (ref 8.9–10.3)
CO2: 24 mmol/L (ref 22–32)
CREATININE: 0.66 mg/dL (ref 0.61–1.24)
Chloride: 108 mmol/L (ref 98–111)
Glucose, Bld: 144 mg/dL — ABNORMAL HIGH (ref 70–99)
Potassium: 4 mmol/L (ref 3.5–5.1)
SODIUM: 135 mmol/L (ref 135–145)

## 2017-12-17 LAB — GLUCOSE, CAPILLARY
GLUCOSE-CAPILLARY: 108 mg/dL — AB (ref 70–99)
GLUCOSE-CAPILLARY: 83 mg/dL (ref 70–99)
Glucose-Capillary: 123 mg/dL — ABNORMAL HIGH (ref 70–99)
Glucose-Capillary: 137 mg/dL — ABNORMAL HIGH (ref 70–99)
Glucose-Capillary: 108 mg/dL — ABNORMAL HIGH (ref 70–99)
Glucose-Capillary: 143 mg/dL — ABNORMAL HIGH (ref 70–99)
Glucose-Capillary: 79 mg/dL (ref 70–99)

## 2017-12-17 LAB — URINE CULTURE: Culture: >=100000 — AB

## 2017-12-17 LAB — CBC
HCT: 32.5 % — ABNORMAL LOW (ref 39.0–52.0)
Hemoglobin: 10.4 g/dL — ABNORMAL LOW (ref 13.0–17.0)
MCH: 29.3 pg (ref 26.0–34.0)
MCHC: 32 g/dL (ref 30.0–36.0)
MCV: 91.5 fL (ref 80.0–100.0)
PLATELETS: 141 10*3/uL — AB (ref 150–400)
RBC: 3.55 MIL/uL — AB (ref 4.22–5.81)
RDW: 14.1 % (ref 11.5–15.5)
WBC: 4.8 10*3/uL (ref 4.0–10.5)
nRBC: 0 % (ref 0.0–0.2)

## 2017-12-17 LAB — CULTURE, BLOOD (ROUTINE X 2)

## 2017-12-17 LAB — PHOSPHORUS: PHOSPHORUS: 2.2 mg/dL — AB (ref 2.5–4.6)

## 2017-12-17 LAB — MAGNESIUM: Magnesium: 1.8 mg/dL (ref 1.7–2.4)

## 2017-12-17 LAB — HEPARIN LEVEL (UNFRACTIONATED): HEPARIN UNFRACTIONATED: 0.29 [IU]/mL — AB (ref 0.30–0.70)

## 2017-12-17 MED ORDER — METHOCARBAMOL 1000 MG/10ML IJ SOLN
1000.0000 mg | Freq: Three times a day (TID) | INTRAVENOUS | Status: DC
  Administered 2017-12-17 – 2017-12-21 (×12): 1000 mg via INTRAVENOUS
  Filled 2017-12-17 (×13): qty 10

## 2017-12-17 MED ORDER — MAGNESIUM SULFATE 2 GM/50ML IV SOLN
2.0000 g | Freq: Once | INTRAVENOUS | Status: AC
  Administered 2017-12-17: 2 g via INTRAVENOUS
  Filled 2017-12-17: qty 50

## 2017-12-17 MED ORDER — SODIUM CHLORIDE 0.9 % IV SOLN
INTRAVENOUS | Status: DC | PRN
  Administered 2017-12-18 – 2017-12-20 (×2): via INTRAVENOUS

## 2017-12-17 MED ORDER — TRAVASOL 10 % IV SOLN
INTRAVENOUS | Status: AC
  Administered 2017-12-17: 17:00:00 via INTRAVENOUS
  Filled 2017-12-17: qty 1260

## 2017-12-17 MED ORDER — SODIUM CHLORIDE 0.9 % IV SOLN
0.0000 mg/h | INTRAVENOUS | Status: DC
  Administered 2017-12-17: 0.5 mg/h via INTRAVENOUS
  Administered 2017-12-18: 3 mg/h via INTRAVENOUS
  Administered 2017-12-19 – 2017-12-20 (×2): 4 mg/h via INTRAVENOUS
  Filled 2017-12-17 (×3): qty 10

## 2017-12-17 MED ORDER — SODIUM CHLORIDE 0.9 % IV SOLN
1.0000 g | Freq: Three times a day (TID) | INTRAVENOUS | Status: AC
  Administered 2017-12-17 – 2017-12-23 (×18): 1 g via INTRAVENOUS
  Filled 2017-12-17 (×18): qty 1

## 2017-12-17 MED ORDER — SODIUM CHLORIDE 0.9 % IV SOLN
1.0000 g | Freq: Three times a day (TID) | INTRAVENOUS | Status: DC
  Filled 2017-12-17: qty 1

## 2017-12-17 MED ORDER — KCL IN DEXTROSE-NACL 20-5-0.45 MEQ/L-%-% IV SOLN
INTRAVENOUS | Status: DC
  Administered 2017-12-17 (×2): via INTRAVENOUS
  Administered 2017-12-19: 30 mL/h via INTRAVENOUS
  Filled 2017-12-17 (×3): qty 1000

## 2017-12-17 MED ORDER — SODIUM PHOSPHATES 45 MMOLE/15ML IV SOLN
30.0000 mmol | Freq: Once | INTRAVENOUS | Status: AC
  Administered 2017-12-17: 30 mmol via INTRAVENOUS
  Filled 2017-12-17: qty 10

## 2017-12-17 MED ORDER — HEPARIN (PORCINE) 25000 UT/250ML-% IV SOLN
1800.0000 [IU]/h | INTRAVENOUS | Status: DC
  Administered 2017-12-17: 1300 [IU]/h via INTRAVENOUS
  Administered 2017-12-18 – 2017-12-24 (×9): 1450 [IU]/h via INTRAVENOUS
  Administered 2017-12-25: 1550 [IU]/h via INTRAVENOUS
  Administered 2017-12-25 – 2017-12-27 (×3): 1700 [IU]/h via INTRAVENOUS
  Administered 2017-12-28: 1800 [IU]/h via INTRAVENOUS
  Filled 2017-12-17 (×11): qty 250
  Filled 2017-12-17: qty 500
  Filled 2017-12-17 (×3): qty 250

## 2017-12-17 NOTE — Progress Notes (Signed)
ETT hold changed to re-secure tube at 24.

## 2017-12-17 NOTE — Progress Notes (Signed)
ANTICOAGULATION CONSULT NOTE - Initial Consult  Pharmacy Consult for heparin  Indication: DVT  Patient Measurements: Height: 5\' 5"  (165.1 cm) Weight: 222 lb 10.6 oz (101 kg) IBW/kg (Calculated) : 61.5 Heparin Dosing Weight: 84 kg  Vital Signs: Temp: 98.9 F (37.2 C) (11/20 1930) Temp Source: Oral (11/20 1930) BP: 97/68 (11/20 2000) Pulse Rate: 74 (11/20 2000)  Labs: Recent Labs    12/15/17 0440 12/16/17 0303 12/16/17 1115 12/17/17 0541 12/17/17 1044 12/17/17 1957  HGB 13.1 10.4*  --   --  10.4*  --   HCT 41.5 33.1*  --   --  32.5*  --   PLT 230 146*  --   --  141*  --   LABPROT 16.3*  --   --   --   --   --   INR 1.33  --   --   --   --   --   HEPARINUNFRC  --   --   --   --   --  0.29*  CREATININE 0.92 0.94  --  0.66  --   --   CKTOTAL  --   --  135  --   --   --    Assessment: 37 year old male with cerebral palsy. He was admitted with abdominal pain due to a SBO. He went for an ex lap which only showed a dilated small intestine without adhesions. He is currently on TPN while awaiting recovery of bowel function. 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 His INR and warfarin were reversed upon admission for surgery. Separately, he was found to have a urinary obstruction, he was a difficult cath placement and had some minor bleeding.   Hep infusion until can be restarted on warfarin Initial hep lvl 0.29  Goal of Therapy:  Heparin level 0.3-0.7 units/ml Monitor platelets by anticoagulation protocol: Yes   Plan:  Increase heparin to 1450 units/hr Next lvl 0230  Levester Fresh, PharmD, BCPS, BCCCP Clinical Pharmacist [phone]  Please check AMION for all [place] numbers  12/17/2017 8:21 PM

## 2017-12-17 NOTE — Progress Notes (Addendum)
NAME:  [name], MRN:  [id number], DOB:  [birth year], LOS: 3 ADMISSION DATE:  12/14/2017, CONSULTATION DATE:  12/15/2017 REFERRING MD:  N/A, CHIEF COMPLAINT:  Abdominal pain with n/v  Brief History   [name] is a 37y/o male with PMH of Cerebral palsy, DVT on coumadin, HTN, and chronic constipation. He has also had a history of skin ulceration and breakdown.  Past Medical History  Cerebral palsy, SBO s/p exploratory lap, DVT on coumadin, HTN, Neurogenic bladder  Significant Hospital Events   11/18 - Exploratory Laparotomy with lysis of adhesions 11/18 - Desaturation into 50s and initially unresponsive, now intubated and sedated 11/18 - Central Line placed, TPN started 11/19 - Foley placed requiring Urology 11/19 - UCx grew >100k Pseudomonas Aerg. 11/19 - Bil. Duplex U/S of LE neg for DVTs  Consults:  General Surgery  Procedures:  Exploratory lapartomy  Significant Diagnostic Tests:  CT Abd: Dilated small bowel with air-fluid levels to the distal small bowel compatible with distal small bowel obstruction.  Micro Data:  BCx: 1/2 positive for Staph Aureus; likely contaminate cont to monitor Urine Cx: >100k Pseudomonas Aeruginosa MRSA screen: neg  Antimicrobials:  None  Interim history/subjective:  Patient remains sedated but arousable. Report from nurse states he remains agitated and tries to self-extubate but responds well to Versed.  Episode this am approximately at 8:15 concerning for seizure-like activity with whole body tonic clonic activity with desats and increase in HR. Lasted less than one minute and responded to Versed.  Objective   Blood pressure 100/63, pulse 74, temperature 98.9 F (37.2 C), temperature source Oral, resp. rate 14, height 5\' 5"  (1.651 m), weight 101 kg, SpO2 94 %. CVP:  [14 mmHg-18 mmHg] 18 mmHg  Vent Mode: PRVC FiO2 (%):  [40 %] 40 % Set Rate:  [14 bmp] 14 bmp Vt Set:  [460 mL-490 mL] 490 mL PEEP:  [5 cmH20] 5  cmH20 Plateau Pressure:  [20 cmH20-27 cmH20] 27 cmH20   Intake/Output Summary (Last 24 hours) at 12/17/2017 0643 Last data filed at 12/17/2017 0600 Gross per 24 hour  Intake 4396.6 ml  Output 1400 ml  Net 2996.6 ml   Filed Weights   12/14/17 1823 12/17/17 0500  Weight: 90.7 kg 101 kg    Examination: General: sedated, awakens to verbal commands HENT: pupils reactive, ETT in place, NG tube in place Lungs: CTAB, no rhonchi or wheezing Cardiovascular: RRR, no murmurs Abdomen: soft, non-tender, + bowel sounds, midline bandage clean and in place Extremities: left LE is larger than right with +1 edema, =1 pedal pulses Skin: no rashes, dusky appearance of feet bilaterally  Neuro: RASS  Score 0 to -1  Labs   CBC: Recent Labs  Lab 12/14/17 1727 12/15/17 0440 12/16/17 0303  WBC 13.3* 6.0 4.9  NEUTROABS 11.9*  --  3.0  HGB 15.5 13.1 10.4*  HCT 48.0 41.5 33.1*  MCV 90.4 90.4 93.0  PLT 241 230 146*    Basic Metabolic Panel: Recent Labs  Lab 12/14/17 1727 12/14/17 1939 12/15/17 0440 12/16/17 0303 12/17/17 0541  NA 140  --  138 136 135  K 3.4*  --  3.8 3.4* 4.0  CL 98  --  106 106 108  CO2 30  --  24 26 24   GLUCOSE 153*  --  154* 151* 144*  BUN 16  --  12 12 11   CREATININE 1.14  --  0.92 0.94 0.66  CALCIUM 9.6  --  7.8* 7.1* 6.9*  MG  --  2.0 1.5* 1.9 1.8  PHOS  --   --   --  1.3* 2.2*   GFR: Estimated Creatinine Clearance: 136.9 mL/min (by C-G formula based on SCr of 0.66 mg/dL). Recent Labs  Lab 12/14/17 1727  12/14/17 1939  12/14/17 2240 12/15/17 0440 12/15/17 0444 12/16/17 0303 12/16/17 1115 12/16/17 1527  PROCALCITON  --   --  0.77  --   --   --   --   --   --   --   WBC 13.3*  --   --   --   --  6.0  --  4.9  --   --   LATICACIDVEN  --    < >  --    < > 3.01*  --  1.9  --  0.8 0.7   < > = values in this interval not displayed.    Liver Function Tests: Recent Labs  Lab 12/14/17 1727 12/16/17 0303  AST 35 17  ALT 28 15  ALKPHOS 65 39  BILITOT  1.4* 1.1  PROT 7.3 4.4*  ALBUMIN 3.9 2.3*   Recent Labs  Lab 12/14/17 1727  LIPASE 23   No results for input(s): AMMONIA in the last 168 hours.  ABG    Component Value Date/Time   PHART 7.490 (H) 12/15/2017 1048   PCO2ART 31.7 (L) 12/15/2017 1048   PO2ART 204.0 (H) 12/15/2017 1048   HCO3 24.2 12/15/2017 1048   TCO2 25 12/15/2017 1048   O2SAT 100.0 12/15/2017 1048     Coagulation Profile: Recent Labs  Lab 12/14/17 1727 12/15/17 0440  INR 2.35 1.33    Cardiac Enzymes: Recent Labs  Lab 12/16/17 1115  CKTOTAL 135    HbA1C: No results found for: HGBA1C  CBG: Recent Labs  Lab 12/16/17 1109 12/16/17 1507 12/16/17 1926 12/16/17 2343 12/17/17 0350  GLUCAP 97 97 99 137* 123*    Resolved Hospital Problem list   Lactic Acidosis  Assessment & Plan:   Acute Hypoxic Respiratory Failure - Stable on vent. Still unclear etiology but likely multifactorial due to narcotics, aspiration, and post surgical respiratory depression - Daily monitoring for SBT - Remains agitated most likely due to not being on at home anti-spasmatic medications - Consider discussing possibility of having a trach tube placed with family, should be here today  Small Bowel Obstruction s/p Exploratory Laparotomy - General Surgery following; continue NG tube - Started TNA on 11/18; dosed per pharm - Cont airbed due to complications from skin ulcers and tissue breakdown from prior admissions - Cont D51/2NS @ 74ml/hr  Concern for seizure-like activity - stable, responding to versed and on scheduled vallium 2mg  q12. Has Atvian prn - Consult to Neurology; appreciate their recommendations - Spot EEG ordered  UTI - Stable. Patient had clean U/A but grew >100k Pseudomonas colonies on UCx. - Ceftazidime 2g q8hrs started on 11/19 - cont to monitor clinically   Cerebral Palsy with chronic spasticity - Holding home meds: baclofen, flexeril, oral valium, zanaflex - Robaxin IV q8hrs - Vallium 2mg   q12 IV - Ativan 1mg  IV prn  Electrolyte Abnormalities - Give 2g of Mag for hypomagnesemia of 1.8 - On TPN for hypophosphotemia (2.2); management per pharm  DVT on chronic coumadin  - Cont Heparin today to transition to warfarin; pharmacy to dose - Follow INR  HTN - Holding home meds metoprolol and HCTZ - Stable for now, IV Lopressor for  SBP >170  Paroxymsal Atrial Fibrilation. - Stable. Holding metoprolol for now, continue to monitor  heart rate, IV Lopressor for HR > 120 - Restart home meds once he is taking oral  Best practice:  Diet: NPO, NG tube in place Pain/Anxiety/Delirium protocol (if indicated): Ativan prn VAP protocol (if indicated): Initiated on 11/18 DVT prophylaxis: SDCs for now -> heparin to warfarin bridge GI prophylaxis: Pepcid 20mg  BID Mobility: bed/wheelchair bound Code Status: full Family Communication: Sister is HCPOA Disposition: ICU  Critical care time: >30 minutes    Harolyn Rutherford, DO [place], PGY-2

## 2017-12-17 NOTE — Progress Notes (Signed)
ANTICOAGULATION CONSULT NOTE - Initial Consult  Pharmacy Consult for heparin  Indication: DVT  Patient Measurements: Height: 5\' 5"  (165.1 cm) Weight: 222 lb 10.6 oz (101 kg) IBW/kg (Calculated) : 61.5 Heparin Dosing Weight: 84 kg  Vital Signs: Temp: 98.1 F (36.7 C) (11/20 0700) Temp Source: Oral (11/20 0700) BP: 89/58 (11/20 0900) Pulse Rate: 85 (11/20 0900)  Labs: Recent Labs    12/14/17 1727 12/15/17 0440 12/16/17 0303 12/16/17 1115 12/17/17 0541  HGB 15.5 13.1 10.4*  --   --   HCT 48.0 41.5 33.1*  --   --   PLT 241 230 146*  --   --   LABPROT 25.4* 16.3*  --   --   --   INR 2.35 1.33  --   --   --   CREATININE 1.14 0.92 0.94  --  0.66  CKTOTAL  --   --   --  135  --     Medical History: Past Medical History:  Diagnosis Date  . Cerebral palsy ([place])   . Chronic constipation   . Coagulopathy ([place])   . Hypertension   . Insomnia   . Recurrent UTI   . Tachycardia     Assessment: 37 year old male with cerebral palsy. He was admitted with abdominal pain due to a SBO. He went for an ex lap which only showed a dilated small intestine without adhesions. He is currently on TPN while awaiting recovery of bowel function. 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 His INR and warfarin were reversed upon admission for surgery. Separately, he was found to have a urinary obstruction, he was a difficult cath placement and had some meatal bleeding. This is now resolved. He was placed on heparin sq for prophylaxis yesterday, which he has tolerated. Now he will be placed on a heparin infusion until warfarin can be safely resumed.    Goal of Therapy:  Heparin level 0.3-0.7 units/ml Monitor platelets by anticoagulation protocol: Yes   Plan:  -Heparin infusion 1300 units/hr -Daily HL, CBC -Check HL in 6 hours   Kaitlyne Friedhoff, Jake Church 12/17/2017,10:45 AM

## 2017-12-17 NOTE — Progress Notes (Signed)
Rt responded to dropping sats and low VT alarms.  Pt having possible seizure or spasm.  Pt's mouth is dropped open and not biting on the endotracheal tube, but his vent VT's are in the low 100's.  Pt manually ventilated using an ambu bag until his sats recovered to 100%.  Pt placed back on full vent supoort.  RN at bedside.  MD called to pt's room.

## 2017-12-17 NOTE — Progress Notes (Signed)
PHARMACY - ADULT TOTAL PARENTERAL NUTRITION CONSULT NOTE   Pharmacy Consult for TPN Indication: Nutrition s/p surgery for small bowel obstruction  Patient Measurements: Height: 5\' 5"  (165.1 cm) Weight: 222 lb 10.6 oz (101 kg) IBW/kg (Calculated) : 61.5  AdjBW = 68.8 kg   Body mass index is 37.05 kg/m. Usual Weight: 90.7 kg  Assessment:  Patient admitted 11/17 by ED p/w abdominal pain, N/V, lactic acidosis to 6, WBC 13, CT scan supportive of SBO. Pt required immediate surgery. 11/18 0208 s/p surgery for SBO; found diffusely dilated small intestine without transition point, small amount of adhesions, no ischemia. Patient extubated in OR, transferred to 29M,  intubated due to insufficient oxygenation at 0918. We are starting TPN because historically patient has had long post-op illeuses.  GI: s.p ExLap with LoA on 11/18; albumin 3.9 on admit. Pre-albumin 11.7. NG o/p 0 mL, NGT to LIWS  Endo: No h/o DM, BG <180 Insulin requirements in the past 24 hours: 2 u Lytes: Mg 1.8; Ca2+ 8.1 (corrected), K+ 4, Phos 1.3 > 2.2 (s/p 30 mmol NaPhos)  Renal: SCr down to 0.66, UOP 0.6 ml/kg/hr . +8.6 L since admission  Pulm: Intubated, PRVC, FiO2 down to 40%, PEEP 5 Cards: MAP 70s, no pressors, NSR, BP soft, HR 70s Hepatobil: LFTs wnl, TG 96  Neuro: On fent @ 400, Dex @ 1.2, baseline cerebal palsy, requires chronic antispasmodics. 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 Hold 20% lipid emulsion for first 7 days for ICU patients per ASPEN guidelines (Lipid start date 11/25) This TPN provides 125 g of protein, 184 g of dextrose, and 0 g of lipids  which provides 1125 kCals per day, meeting 100% of patient needs. Electrolytes in TPN: Cl:Ac 1:1, Increase Na Add MVI, trace elements and famotidine to TPN  Currently no insulin requirements, continue to monitor BG. If CBGs remains controlled after next TPN increase, will d/c CBG checks  IVMF D5W with 1/2NS with 20 mEq KCl at 40 ml/hr will be reduced to 30 mL/h once TPN rate is increased NaPhos 30 mmol IV once  Monitor TPN labs, Phos, Mg, BMP.  Albertina Parr, PharmD., BCPS Clinical Pharmacist Clinical phone for 12/17/17 until 3:30pm: [phone] If after 3:30pm, please refer to [facility] for unit-specific pharmacist

## 2017-12-17 NOTE — Progress Notes (Signed)
[facility] Progress Note  2 Days Post-Op  Subjective: CC-  On the vent. Concern for possible seizure activity, primary team has consulted neurology.   Objective: Vital signs in last 24 hours: Temp:  [98.1 F (36.7 C)-100.6 F (38.1 C)] 98.1 F (36.7 C) (11/20 0700) Pulse Rate:  [71-110] 71 (11/20 0700) Resp:  [14-22] 14 (11/20 0700) BP: (90-130)/(55-83) 92/62 (11/20 0700) SpO2:  [88 %-100 %] 100 % (11/20 0755) FiO2 (%):  [40 %] 40 % (11/20 0755) Weight:  [101 kg] 101 kg (11/20 0500) Last BM Date: 12/15/17  Intake/Output from previous day: 11/19 0701 - 11/20 0700 In: 4222.4 [I.V.:3445.7; IV Piggyback:776.7] Out: 1400 [Urine:1400] Intake/Output this shift: No intake/output data recorded.  Vent: Vent Mode: PRVC FiO2 (%):  [40 %] 40 % Set Rate:  [14 bmp] 14 bmp Vt Set:  [460 mL-490 mL] 490 mL PEEP:  [5 cmH20] 5 cmH20 Plateau Pressure:  [20 cmH20-27 cmH20] 22 cmH20   PE: Gen:  Sedated, opens eyes to verbal stimulus, NAD HEENT: EOM's intact, pupils equal and round, NG tube in place/no output recorded but does not seem to be working Pulm:  Mechanically ventilated Abd: distended but soft, nontender, hypoactive BS, midline incision with trace serosanguinous drainage/staples intact  Lab Results:  Recent Labs    12/15/17 0440 12/16/17 0303  WBC 6.0 4.9  HGB 13.1 10.4*  HCT 41.5 33.1*  PLT 230 146*   BMET Recent Labs    12/16/17 0303 12/17/17 0541  NA 136 135  K 3.4* 4.0  CL 106 108  CO2 26 24  GLUCOSE 151* 144*  BUN 12 11  CREATININE 0.94 0.66  CALCIUM 7.1* 6.9*   PT/INR Recent Labs    12/14/17 1727 12/15/17 0440  LABPROT 25.4* 16.3*  INR 2.35 1.33   CMP     Component Value Date/Time   NA 135 12/17/2017 0541   K 4.0 12/17/2017 0541   CL 108 12/17/2017 0541   CO2 24 12/17/2017 0541   GLUCOSE 144 (H) 12/17/2017 0541   BUN 11 12/17/2017 0541   CREATININE 0.66 12/17/2017 0541   CREATININE 1.00 07/26/2015 1426   CALCIUM 6.9 (L)  12/17/2017 0541   PROT 4.4 (L) 12/16/2017 0303   ALBUMIN 2.3 (L) 12/16/2017 0303   AST 17 12/16/2017 0303   ALT 15 12/16/2017 0303   ALKPHOS 39 12/16/2017 0303   BILITOT 1.1 12/16/2017 0303   GFRNONAA >60 12/17/2017 0541   GFRNONAA >89 07/26/2015 1426   GFRAA >60 12/17/2017 0541   GFRAA >89 07/26/2015 1426   Lipase     Component Value Date/Time   LIPASE 23 12/14/2017 1727       Studies/Results: Dg Chest 1 View  Result Date: 12/15/2017 CLINICAL DATA:  37 year old male with history of central line placement. EXAM: CHEST  1 VIEW COMPARISON:  Chest x-ray 12/15/2017. FINDINGS: An endotracheal tube is in place with tip 3.0 cm above the carina. There is a right-sided internal jugular central venous catheter with tip terminating in the superior cavoatrial junction. Nasogastric tube extends into the proximal stomach. Lung volumes are low. Opacity in the left base with blunting of the left hemidiaphragm. Right lung appears clear. No pneumothorax. No evidence of pulmonary edema. Heart size is normal. The patient is rotated to the left on today's exam, resulting in distortion of the mediastinal contours and reduced diagnostic sensitivity and specificity for mediastinal pathology. IMPRESSION: 1. Support apparatus, as above. 2. Low lung volumes with left basilar opacity which likely reflects a  small left pleural effusion as well as left lower lobe atelectasis and/or airspace consolidation. Electronically Signed   By: Vinnie Langton M.D.   On: 12/15/2017 16:47   Dg Chest Port 1 View  Result Date: 12/16/2017 CLINICAL DATA:  Respiratory failure, shortness of breath. EXAM: PORTABLE CHEST 1 VIEW COMPARISON:  Radiograph of December 05, 2017. FINDINGS: Stable cardiomegaly. Endotracheal and nasogastric tubes are unchanged in position. Right internal jugular catheter is unchanged in position. No pneumothorax is noted. Stable bibasilar atelectasis is noted. Bony thorax is unremarkable. IMPRESSION: Stable  support apparatus.  Stable bibasilar opacities. Electronically Signed   By: Marijo Conception, M.D.   On: 12/16/2017 07:14   Portable Chest X-ray  Result Date: 12/15/2017 CLINICAL DATA:  Ventilator patient. EXAM: PORTABLE CHEST 1 VIEW COMPARISON:  12/14/2017 and 11/14/2011. FINDINGS: 0934 hours. Interval intubation. The tip of the endotracheal tube is approximately 1.8 cm above the carina. Enteric tube projects into the stomach. There is overall improved aeration of the lung bases with mild left-greater-than-right probable atelectasis. No pleural effusion or pneumothorax. Heart size and mediastinal contours are stable. There is a convex right thoracic scoliosis. IMPRESSION: Endotracheal tube tip 1.8 cm above the carina. This could be retracted 2 cm for more optimal positioning. Enteric tube extends into the stomach. Interval improved aeration of the lung bases with probable left-greater-than-right basilar atelectasis. Electronically Signed   By: Richardean Sale M.D.   On: 12/15/2017 09:51   Vas [place] Lower Extremity Venous (dvt)  Result Date: 12/16/2017  Lower Venous Study Indications: Swelling, and warmth.  Limitations: Poor ultrasound/tissue interface and patient position. Comparison Study: 10/17/16 Performing Technologist: Landry Mellow RDMS, RVT  Examination Guidelines: A complete evaluation includes B-mode imaging, spectral Doppler, color Doppler, and power Doppler as needed of all accessible portions of each vessel. Bilateral testing is considered an integral part of a complete examination. Limited examinations for reoccurring indications may be performed as noted.  Right Venous Findings: +---------+---------------+---------+-----------+----------+-------------------+          CompressibilityPhasicitySpontaneityPropertiesSummary             +---------+---------------+---------+-----------+----------+-------------------+ CFV      Full           Yes      Yes                                       +---------+---------------+---------+-----------+----------+-------------------+ SFJ      Full                                                             +---------+---------------+---------+-----------+----------+-------------------+ FV Prox  Full                                                             +---------+---------------+---------+-----------+----------+-------------------+ FV Mid                           Yes                                      +---------+---------------+---------+-----------+----------+-------------------+  FV Distal                                             not visualized due                                                        to medially turned                                                        leg                 +---------+---------------+---------+-----------+----------+-------------------+ PFV      Full                                                             +---------+---------------+---------+-----------+----------+-------------------+ POP      Full           Yes      Yes                                      +---------+---------------+---------+-----------+----------+-------------------+ PTV                                                   not visualized      +---------+---------------+---------+-----------+----------+-------------------+ PERO                                                  not visualized      +---------+---------------+---------+-----------+----------+-------------------+  Left Venous Findings: +---------+---------------+---------+-----------+----------+-------------------+          CompressibilityPhasicitySpontaneityPropertiesSummary             +---------+---------------+---------+-----------+----------+-------------------+ CFV                     Yes      Yes                  poorly visualized    +---------+---------------+---------+-----------+----------+-------------------+ FV Prox  Full                                         poorly visualized   +---------+---------------+---------+-----------+----------+-------------------+ FV Mid  not visualized      +---------+---------------+---------+-----------+----------+-------------------+ FV Distal                                             not visualized-                                                           poor tissue                                                               interface           +---------+---------------+---------+-----------+----------+-------------------+ POP                     Yes      Yes                                      +---------+---------------+---------+-----------+----------+-------------------+ PTV                                                   poorly visualized   +---------+---------------+---------+-----------+----------+-------------------+ PERO                                                  poorly visualized   +---------+---------------+---------+-----------+----------+-------------------+ AV malformation left groin    Summary: Right: There is no evidence of deep vein thrombosis in the lower extremity. However, portions of this examination were limited- see technologist comments above. Left: There is no evidence of deep vein thrombosis in the lower extremity. However, portions of this examination were limited- see technologist comments above. 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  *See table(s) above for measurements and observations. Electronically signed by Deitra Mayo MD on 12/16/2017 at 3:53:10 PM.    Final     Anti-infectives: Anti-infectives (From admission, onward)   Start     Dose/Rate Route Frequency Ordered Stop   12/17/17 1600  cefTAZidime (FORTAZ) 1 g in sodium chloride 0.9  % 100 mL IVPB  Status:  Discontinued     1 g 200 mL/hr over 30 Minutes Intravenous Every 8 hours 12/17/17 0800 12/17/17 0815   12/17/17 0900  cefTAZidime (FORTAZ) 1 g in sodium chloride 0.9 % 100 mL IVPB     1 g 200 mL/hr over 30 Minutes Intravenous Every 8 hours 12/17/17 0815 12/23/17 0759   12/16/17 1400  cefTAZidime (FORTAZ) 2 g in sodium chloride 0.9 % 100 mL IVPB  Status:  Discontinued     2 g 200 mL/hr over 30 Minutes Intravenous Every 8 hours 12/16/17 1125 12/17/17 0800   12/15/17 0300  vancomycin (VANCOCIN) IVPB 1000  mg/200 mL premix  Status:  Discontinued     1,000 mg 200 mL/hr over 60 Minutes Intravenous Every 8 hours 12/14/17 1844 12/15/17 1133   12/14/17 1900  vancomycin (VANCOCIN) 1,750 mg in sodium chloride 0.9 % 500 mL IVPB     1,750 mg 250 mL/hr over 120 Minutes Intravenous  Once 12/14/17 1844 12/14/17 2343   12/14/17 1845  ceFEPIme (MAXIPIME) 2 g in sodium chloride 0.9 % 100 mL IVPB  Status:  Discontinued     2 g 200 mL/hr over 30 Minutes Intravenous Every 8 hours 12/14/17 1844 12/15/17 1133   12/14/17 1800  ceFEPIme (MAXIPIME) 2 g in sodium chloride 0.9 % 100 mL IVPB  Status:  Discontinued     2 g 200 mL/hr over 30 Minutes Intravenous  Once 12/14/17 1753 12/14/17 1844   12/14/17 1800  metroNIDAZOLE (FLAGYL) IVPB 500 mg  Status:  Discontinued     500 mg 100 mL/hr over 60 Minutes Intravenous Every 8 hours 12/14/17 1753 12/15/17 1133   12/14/17 1800  vancomycin (VANCOCIN) IVPB 1000 mg/200 mL premix  Status:  Discontinued     1,000 mg 200 mL/hr over 60 Minutes Intravenous  Once 12/14/17 1753 12/14/17 1844       Assessment/Plan 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 - POD 2 - BID dry dressing changes to midline wound, monitor closely - no bowel function  ID - currently fortaz 11/19>> FEN - IVF, NPO/NGT to LIWS, TNA VTE - SCDs, sq heparin Foley - in place/per urology  Plan: CBC pending. RN to change out NG tube, continue to LIWS. Continue NPO and await return in bowel function.   LOS: 3 days    Wellington Hampshire , [facility] 12/17/2017, 9:35 AM Pager: [phone] Mon 7:00 am -11:30 AM Tues-Fri 7:00 am-4:30 pm Sat-Sun 7:00 am-11:30 am

## 2017-12-17 NOTE — Procedures (Signed)
ELECTROENCEPHALOGRAM REPORT   Patient: [name]       Room #: 1M21R EEG No. ID: [id number] Age: 37 y.o.        Sex: male Referring Physician: Garlan Fillers Report Date:  12/17/2017        Interpreting Physician: Alexis Goodell  History: Saliou Barnier is an 37 y.o. male with seizure-like activity  Medications:  Fortaz, Precedex, Fentanyl, Valium, Insulin, Robaxin, Heparin  Conditions of Recording:  This is a 21 channel routine scalp EEG performed with bipolar and monopolar montages arranged in accordance to the international 10/20 system of electrode placement. One channel was dedicated to EKG recording.  The patient is in the intubated and sedated state.  Description:  The background activity is slow and poorly organized.   It consists of a low voltage, polymorphic delta rhythm that is diffusely distributed and continuous.  The patient has multiple episodes of of muscle spasms and stiffening during the recording.  There is some muscle artifact noted but no change otherwise in the background rhythm during these episodes.  Hyperventilation and intermittent photic stimulation were not performed.   IMPRESSION: This is an abnormal EEG secondary to general background slowing.  This finding may be seen with a diffuse disturbance that is etiologically nonspecific, but may include a metabolic encephalopathy or medication effect, among other possibilities.  No epileptiform activity was noted.   The patient has multiple episodes of muscle spasm during the recording without change in background rhythm noted.     Alexis Goodell, MD Neurology [phone] 12/17/2017, 11:42 AM

## 2017-12-17 NOTE — Care Management Important Message (Signed)
Important Message  Patient Details  Name: [name] MRN: [id number] Date of Birth: 1980-07-09   Medicare Important Message Given:  Yes    Yandel Zeiner [address] 12/17/2017, 2:57 PM

## 2017-12-17 NOTE — Progress Notes (Signed)
RT responded to vent alarms, pt has low VT due to a possible muscle spasm.  Pt is not biting on the endotracheal tube but his VT's on the vent are in the low 100's and his sats dropped to mid 80's.  RT used ambu bag for manual ventilation.  The pt bags easily with the ambu bag and sats recovered to 100%.  RN gave some sedation medications, the pt became more relaxed and was returned to full vent support.

## 2017-12-17 NOTE — Progress Notes (Signed)
EEG completed, results pending. 

## 2017-12-17 NOTE — Consult Note (Addendum)
Neurology Consultation  Reason for Consult: Possible seizure activity Referring Physician: Chase Caller  CC: Possible seizure activity versus spasms  History is obtained from: Chart, as patient is significantly sedated  HPI: [name] is a 37 y.o. male with history of tachycardia, recurrent UTI, hypertension, coagulopathy, cerebral palsy and DVT on chronic Coumadin.  "Patient does have a history of chronic constipation with nausea and vomiting however on the morning of admission it was progressively worse.  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  While in the hospital patient has been unable to receive much of his antispasmodics.  In addition he has received a exploratory laparotomy with lysis of adhesions, Foley placed requiring urology, noted urine culture grew greater than 8000 K Pseudomonas.  Today patient was noted to have flailing of arms and intermittent jerking which concerned PCCM for possible seizure due to the fact that he desatted and became tachycardic during these episodes.  Patient did respond to Versed, Precedex 1.2 mcg/kilogram/per hour, fentanyl 400 mcg/per hour, Robaxin 500 mg every 8 hours via IV.  Unfortunately patient cannot take p.o. medications.  Patient's home meds include: - Flexeril 1 at 8 AM and 12 PM along with 2 tabs at 8 PM - Valium 2 mg by mouth twice daily - Zanaflex 2 mg by mouth 3 times daily  While entering the room I did note 1 of the events described by the ICU team. During the spell the patient had 1 full body jerk in which his arms went up and his abdomen flexed and then he relaxed.  At that time it looked more like a muscle jerk/myoclonic jerk and less like seizure.   Currently he is also on Valium 2 mg twice daily.   FUX:NATFTD to obtain due to altered mental status.   Past Medical History:  Diagnosis Date  . Cerebral palsy ([place])   . Chronic constipation   . Coagulopathy ([place])   . Hypertension   .  Insomnia   . Recurrent UTI   . Tachycardia     Family History  Family history unknown: Yes   Social History:   reports that he has never smoked. He has never used smokeless tobacco. He reports that he does not drink alcohol or use drugs.  Medications  Current Facility-Administered Medications:  .  0.9 %  sodium chloride infusion, , Intravenous, Continuous, Chesley Mires, MD, Stopped at 12/17/17 0840 .  cefTAZidime (FORTAZ) 1 g in sodium chloride 0.9 % 100 mL IVPB, 1 g, Intravenous, Q8H, Masters, Jake Church, RPH .  chlorhexidine gluconate (MEDLINE KIT) (PERIDEX) 0.12 % solution 15 mL, 15 mL, Mouth Rinse, BID, Renee Pain, MD, 15 mL at 12/17/17 0814 .  Chlorhexidine Gluconate Cloth 2 % PADS 6 each, 6 each, Topical, Daily, Anders Simmonds, MD, 6 each at 12/17/17 0330 .  dexmedetomidine (PRECEDEX) 400 MCG/100ML (4 mcg/mL) infusion, 0.4-1.2 mcg/kg/hr, Intravenous, Titrated, Erick Colace, NP, Last Rate: 27.2 mL/hr at 12/17/17 0936, 1.2 mcg/kg/hr at 12/17/17 0936 .  dextrose 5 % and 0.45 % NaCl with KCl 20 mEq/L infusion, , Intravenous, Continuous, Mancheril, Darnell Level, RPH, Last Rate: 40 mL/hr at 12/17/17 0900 .  diazepam (VALIUM) injection 2 mg, 2 mg, Intravenous, BID, Lockamy, Timothy, DO, 2 mg at 12/17/17 0717 .  etomidate (AMIDATE) injection 20 mg, 20 mg, Intravenous, Once, Salvadore Dom E, NP .  fentaNYL (SUBLIMAZE) bolus via infusion 50 mcg, 50 mcg, Intravenous, Q1H PRN, Erick Colace, NP, 50 mcg at 12/17/17  0901 .  fentaNYL 2533mg in NS 2577m(1037mml) infusion-PREMIX, 25-400 mcg/hr, Intravenous, Continuous, BabErick ColaceP, Last Rate: 40 mL/hr at 12/17/17 0900, 400 mcg/hr at 12/17/17 0900 .  heparin injection 5,000 Units, 5,000 Units, Subcutaneous, Q8H, MasHarvel QualePH, 5,000 Units at 12/17/17 0516 .  insulin aspart (novoLOG) injection 0-9 Units, 0-9 Units, Subcutaneous, Q4H, Mancheril, BenDarnell LevelPH, 1 Units at 12/17/17 0500 .  LORazepam (ATIVAN) injection 0.5  mg, 0.5 mg, Intravenous, Q6H PRN, JeoRenee PainD, 0.5 mg at 12/16/17 0520 .  magnesium sulfate IVPB 2 g 50 mL, 2 g, Intravenous, Once, Lockamy, Timothy, DO .  MEDLINE mouth rinse, 15 mL, Mouth Rinse, 10 times per day, JeoRenee PainD, 15 mL at 12/17/17 0500 .  methocarbamol (ROBAXIN) 500 mg in dextrose 5 % 50 mL IVPB, 500 mg, Intravenous, Q8H, JeoRenee PainD, Last Rate: 100 mL/hr at 12/17/17 0518, 500 mg at 12/17/17 0518 .  metoprolol tartrate (LOPRESSOR) injection 5 mg, 5 mg, Intravenous, Q6H PRN, SooChesley MiresD .  midazolam (VERSED) injection 1-4 mg, 1-4 mg, Intravenous, Q2H PRN, RamBrand MalesD, 2 mg at 12/17/17 0813 .  ondansetron (ZOFRAN) injection 4 mg, 4 mg, Intravenous, Q6H PRN, JeoRenee PainD .  sodium chloride flush (NS) 0.9 % injection 10-40 mL, 10-40 mL, Intracatheter, Q12H, Sood, Vineet, MD, 10 mL at 12/16/17 2200 .  sodium chloride flush (NS) 0.9 % injection 10-40 mL, 10-40 mL, Intracatheter, PRN, Sood, Vineet, MD .  sodium phosphate 30 mmol in dextrose 5 % 250 mL infusion, 30 mmol, Intravenous, Once, Mancheril, BenDarnell LevelPH .  TPN ADULT (ION), , Intravenous, Continuous TPN, Mancheril, BenDarnell LevelPH, Last Rate: 60 mL/hr at 12/17/17 0900   Exam: Current vital signs: BP (!) 89/58   Pulse 85   Temp 98.1 F (36.7 C) (Oral)   Resp 14   Ht 5' 5" (1.651 m)   Wt 101 kg   SpO2 98%   BMI 37.05 kg/m  Vital signs in last 24 hours: Temp:  [98.1 F (36.7 C)-100.6 F (38.1 C)] 98.1 F (36.7 C) (11/20 0700) Pulse Rate:  [71-110] 85 (11/20 0900) Resp:  [14-22] 14 (11/20 0900) BP: (89-130)/(55-83) 89/58 (11/20 0900) SpO2:  [88 %-100 %] 98 % (11/20 0900) FiO2 (%):  [40 %] 40 % (11/20 0755) Weight:  [101 kg] 101 kg (11/20 0500)  Physical Exam  Constitutional: Appears well-developed and well-nourished.  Psych: Intubated and sedated Eyes: No scleral injection HENT: No OP obstrucion Head: Normocephalic.  Cardiovascular: Normal rate and regular  rhythm.  Respiratory: Intubated and not breathing over the vent secondary due to sedation GI: Distended Skin: WDI  Neuro: Mental Status: Intubated and sedated. Cranial Nerves: II: No blink to threat  III,IV, VI: Doll's intact. pupils are equal, round, and reactive to light.   V: Patient is significantly sedated and makes no grimace to noxious stimuli VII: Face appears symmetrical VIII: No response to voice X: Unable to visualize Motor: Increased tone in bilateral lower extremities and decreased tone in bilateral upper extremities on initial NP exam. During attending follow up exam all 4 extremities were flaccid under the current sedation regimen without withdrawal to noxious stimuli.  Sensory: No response to noxious stimuli throughout Deep Tendon Reflexes: I cannot elicit any reflexes in lower extremities. Trace reflexes elicited in the upper extremities Plantars: Toes are mute Cerebellar: Unable to obtain   Labs I have reviewed labs in epic and the results pertinent to this consultation are:   CBC  Component Value Date/Time   WBC 4.9 12/16/2017 0303   RBC 3.56 (L) 12/16/2017 0303   HGB 10.4 (L) 12/16/2017 0303   HCT 33.1 (L) 12/16/2017 0303   PLT 146 (L) 12/16/2017 0303   MCV 93.0 12/16/2017 0303   MCV 86.2 07/26/2015 1425   MCH 29.2 12/16/2017 0303   MCHC 31.4 12/16/2017 0303   RDW 14.3 12/16/2017 0303   LYMPHSABS 1.1 12/16/2017 0303   MONOABS 0.7 12/16/2017 0303   EOSABS 0.1 12/16/2017 0303   BASOSABS 0.0 12/16/2017 0303    CMP     Component Value Date/Time   NA 135 12/17/2017 0541   K 4.0 12/17/2017 0541   CL 108 12/17/2017 0541   CO2 24 12/17/2017 0541   GLUCOSE 144 (H) 12/17/2017 0541   BUN 11 12/17/2017 0541   CREATININE 0.66 12/17/2017 0541   CREATININE 1.00 07/26/2015 1426   CALCIUM 6.9 (L) 12/17/2017 0541   PROT 4.4 (L) 12/16/2017 0303   ALBUMIN 2.3 (L) 12/16/2017 0303   AST 17 12/16/2017 0303   ALT 15 12/16/2017 0303   ALKPHOS 39 12/16/2017  0303   BILITOT 1.1 12/16/2017 0303   GFRNONAA >60 12/17/2017 0541   GFRNONAA >89 07/26/2015 1426   GFRAA >60 12/17/2017 0541   GFRAA >89 07/26/2015 1426    Lipid Panel     Component Value Date/Time   CHOL  09/30/2008 0350    87        ATP III CLASSIFICATION:  <200     mg/dL   Desirable  200-239  mg/dL   Borderline High  >=240    mg/dL   High          TRIG 96 12/16/2017 0303     Imaging I have reviewed the images obtained:  Etta Quill PA-C [facility] [phone] 12/17/2017, 9:56 AM    Assessment:  37 year old male with cerebral palsy and significant muscle spasms treated with medications at home, who is 2 days post-op from ex-lap for evaluation/management of SBO seen on CT of abdomen/pelvis. Due to inability to sufficiently continue to treat intermittent spasms with home po antispasmodics, spasms have worsened post-operatively.  1. EEG confirms that the muscle spasms are not due to seizures. EEG preliminary review: Diffuse symmetric slowing of the background is consistent with an encephalopathy, most likely medication related due to sedation. No electrographic seizures are seen.  2. Patient was on multiple muscle relaxants at home, but has missed doses due to inability to take p.o. medications in the context of post-op status from GI surgery with ileus. It is felt that muscle relaxants may worsen his ileus. However, he is on baclofen at this time.   3. At the time of the attending evaluation, the patient is on fentanyl, Versed and Precedex for management of agitation/spasms and cannot be aroused from an unresponsive, heavily sedated state. 4. While NP was assessing the patient, he actually was awake enough to nod his head.  He did have 1 of the episodes of muscle spasm. He was asked if this is a muscle spasm and he nodded yes. When asked if he was in pain he also nodded yes.  Recommendations: Pharmacy has looked at all of his muscle relaxants that possibly could  work and the only one is Robaxin at this time. When able to start to re-administer his home anti-spasmodics, would gradually titrate upwards after discontinuing his precedex, while tapering down on the fentanyl, Versed drips.  Addendum:  Official EEG report resulted: This is an abnormal  EEG secondary to general background slowing.  This finding may be seen with a diffuse disturbance that is etiologically nonspecific, but may include a metabolic encephalopathy or medication effect, among other possibilities. No epileptiform activity was noted. The patient has multiple episodes of muscle spasm during the recording without change in background rhythm noted.    40 minutes spent in the neurological evaluation and management of this critically ill patient with muscle spasms versus seizures  I have examined the patient. He is post-op from ex-lap for SBO. Exam findings include flaccid extremities under sedation. EEG shows diffuse slowing without electrographic seizures. I have formulated the assessment and recommendations above.   Electronically signed: Dr. Kerney Elbe

## 2017-12-18 ENCOUNTER — Inpatient Hospital Stay (HOSPITAL_COMMUNITY): Payer: Medicare Other

## 2017-12-18 LAB — CBC
HCT: 34.6 % — ABNORMAL LOW (ref 39.0–52.0)
Hemoglobin: 11 g/dL — ABNORMAL LOW (ref 13.0–17.0)
MCH: 29.6 pg (ref 26.0–34.0)
MCHC: 31.8 g/dL (ref 30.0–36.0)
MCV: 93 fL (ref 80.0–100.0)
NRBC: 0 % (ref 0.0–0.2)
PLATELETS: 157 10*3/uL (ref 150–400)
RBC: 3.72 MIL/uL — ABNORMAL LOW (ref 4.22–5.81)
RDW: 13.9 % (ref 11.5–15.5)
WBC: 6.1 10*3/uL (ref 4.0–10.5)

## 2017-12-18 LAB — COMPREHENSIVE METABOLIC PANEL
ALT: 13 U/L (ref 0–44)
ANION GAP: 3 — AB (ref 5–15)
AST: 17 U/L (ref 15–41)
Albumin: 2.1 g/dL — ABNORMAL LOW (ref 3.5–5.0)
Alkaline Phosphatase: 45 U/L (ref 38–126)
BILIRUBIN TOTAL: 0.3 mg/dL (ref 0.3–1.2)
BUN: 12 mg/dL (ref 6–20)
CALCIUM: 7 mg/dL — AB (ref 8.9–10.3)
CO2: 24 mmol/L (ref 22–32)
Chloride: 108 mmol/L (ref 98–111)
Creatinine, Ser: 0.63 mg/dL (ref 0.61–1.24)
Glucose, Bld: 96 mg/dL (ref 70–99)
POTASSIUM: 3.9 mmol/L (ref 3.5–5.1)
Sodium: 135 mmol/L (ref 135–145)
TOTAL PROTEIN: 4.5 g/dL — AB (ref 6.5–8.1)

## 2017-12-18 LAB — HEPARIN LEVEL (UNFRACTIONATED)
HEPARIN UNFRACTIONATED: 0.45 [IU]/mL (ref 0.30–0.70)
Heparin Unfractionated: 0.38 IU/mL (ref 0.30–0.70)

## 2017-12-18 LAB — GLUCOSE, CAPILLARY
GLUCOSE-CAPILLARY: 85 mg/dL (ref 70–99)
GLUCOSE-CAPILLARY: 98 mg/dL (ref 70–99)
Glucose-Capillary: 78 mg/dL (ref 70–99)

## 2017-12-18 LAB — MAGNESIUM: MAGNESIUM: 2.3 mg/dL (ref 1.7–2.4)

## 2017-12-18 LAB — CK: Total CK: 272 U/L (ref 49–397)

## 2017-12-18 LAB — PHOSPHORUS: Phosphorus: 2.7 mg/dL (ref 2.5–4.6)

## 2017-12-18 MED ORDER — POTASSIUM CHLORIDE 10 MEQ/100ML IV SOLN
10.0000 meq | INTRAVENOUS | Status: AC
  Administered 2017-12-18 (×2): 10 meq via INTRAVENOUS
  Filled 2017-12-18 (×2): qty 100

## 2017-12-18 MED ORDER — TRACE MINERALS CR-CU-MN-SE-ZN 10-1000-500-60 MCG/ML IV SOLN
INTRAVENOUS | Status: AC
  Administered 2017-12-18: 18:00:00 via INTRAVENOUS
  Filled 2017-12-18: qty 840

## 2017-12-18 MED ORDER — FUROSEMIDE 10 MG/ML IJ SOLN
40.0000 mg | Freq: Once | INTRAMUSCULAR | Status: AC
  Administered 2017-12-18: 40 mg via INTRAVENOUS
  Filled 2017-12-18: qty 4

## 2017-12-18 MED ORDER — FENTANYL CITRATE (PF) 2500 MCG/50ML IJ SOLN
25.0000 ug/h | Status: DC
  Administered 2017-12-18: 250 ug/h via INTRAVENOUS
  Administered 2017-12-19: 300 ug/h via INTRAVENOUS
  Filled 2017-12-18 (×2): qty 100

## 2017-12-18 NOTE — Progress Notes (Signed)
NAME:  [name], MRN:  [id number], DOB:  1980-10-17, LOS: 4 ADMISSION DATE:  12/14/2017, CONSULTATION DATE:  12/15/2017 REFERRING MD:  N/A, CHIEF COMPLAINT:  Abd pain with N/V  Brief History   [name] is a 37y/o male with PMH of Cerebral palsy, DVT on coumadin, HTN, and chronic constipation. He has also had a history of skin ulceration and breakdown.  Past Medical History Cerebral palsy, SBO s/p exploratory lap, DVT on coumadin, HTN, Neurogenic bladder  Significant Hospital Events   11/18 - Exploratory Laparotomy with lysis of adhesions 11/18 - Desaturation into 50s and initially unresponsive, now intubated and sedated 11/18 - Central Line placed, TPN started 11/19 - Foley placed requiring Urology 11/19 - UCx grew >100k Pseudomonas Aerg. 11/19 - Bil. Duplex U/S of LE neg for DVTs 11/20 - EEG: slowly most likely due to sedating medications; no signs of seizure activity  Consults:  [place]  Procedures:  Exploratory Lapartomy Bilateral doppler LE U/S EGG  Significant Diagnostic Tests:  CT Abd: Dilated small bowel with air-fluid levels to the distal small bowel compatible with distal small bowel obstruction. EEG: slowing most likely due to sedating medications; no signs of seizure acitivity  Micro Data:  BCx: 1/2 positive for Staph Aureus; likely contaminate cont to monitor Urine Cx: >100k Pseudomonas Aeruginosa MRSA screen: neg  Antimicrobials:  None  Interim history/subjective:  Patient remains sedated but arousable. Patient continued to have agitation and was transitioned to Versed drip to which he has responded well.    Objective   Blood pressure 101/69, pulse 91, temperature 98.4 F (36.9 C), temperature source Oral, resp. rate 14, height 5\' 5"  (1.651 m), weight 101 kg, SpO2 100 %. CVP:  [12 mmHg-26 mmHg] 12 mmHg  Vent Mode: PRVC FiO2 (%):  [40 %] 40 % Set Rate:  [14 bmp] 14 bmp Vt Set:  [490 mL] 490 mL PEEP:  [5 cmH20] 5 cmH20 Plateau  Pressure:  [20 IRS85-46 cmH20] 28 cmH20   Intake/Output Summary (Last 24 hours) at 12/18/2017 2703 Last data filed at 12/18/2017 0700 Gross per 24 hour  Intake 6393.53 ml  Output 1255 ml  Net 5138.53 ml   Filed Weights   12/14/17 1823 12/17/17 0500  Weight: 90.7 kg 101 kg    Examination: General: sedated, arousable HENT: NG tube in place, ETT in place Lungs: CTAB no wheezing or rhonchi Cardiovascular: RRR, no murmurs Abdomen: soft, mild distention, non-tender, midline incision clean, dry, no oozing or bleeding, +bowel sounds Extremities: air cushions on both feet, left leg remains swollen and edematous Skin: no rashes Neuro: RASS 0 to -1 GU: foley in place, no drainage or blood  Labs   CBC: Recent Labs  Lab 12/14/17 1727 12/15/17 0440 12/16/17 0303 12/17/17 1044 12/18/17 0341  WBC 13.3* 6.0 4.9 4.8 6.1  NEUTROABS 11.9*  --  3.0  --   --   HGB 15.5 13.1 10.4* 10.4* 11.0*  HCT 48.0 41.5 33.1* 32.5* 34.6*  MCV 90.4 90.4 93.0 91.5 93.0  PLT 241 230 146* 141* 500    Basic Metabolic Panel: Recent Labs  Lab 12/14/17 1727 12/14/17 1939 12/15/17 0440 12/16/17 0303 12/17/17 0541 12/18/17 0341  NA 140  --  138 136 135 135  K 3.4*  --  3.8 3.4* 4.0 3.9  CL 98  --  106 106 108 108  CO2 30  --  24 26 24 24   GLUCOSE 153*  --  154* 151* 144* 96  BUN 16  --  12  12 11 12   CREATININE 1.14  --  0.92 0.94 0.66 0.63  CALCIUM 9.6  --  7.8* 7.1* 6.9* 7.0*  MG  --  2.0 1.5* 1.9 1.8 2.3  PHOS  --   --   --  1.3* 2.2* 2.7   GFR: Estimated Creatinine Clearance: 136.9 mL/min (by C-G formula based on SCr of 0.63 mg/dL). Recent Labs  Lab 12/14/17 1939  12/14/17 2240 12/15/17 0440 12/15/17 0444 12/16/17 0303 12/16/17 1115 12/16/17 1527 12/17/17 1044 12/18/17 0341  PROCALCITON 0.77  --   --   --   --   --   --   --   --   --   WBC  --   --   --  6.0  --  4.9  --   --  4.8 6.1  LATICACIDVEN  --    < > 3.01*  --  1.9  --  0.8 0.7  --   --    < > = values in this interval  not displayed.    Liver Function Tests: Recent Labs  Lab 12/14/17 1727 12/16/17 0303 12/18/17 0341  AST 35 17 17  ALT 28 15 13   ALKPHOS 65 39 45  BILITOT 1.4* 1.1 0.3  PROT 7.3 4.4* 4.5*  ALBUMIN 3.9 2.3* 2.1*   Recent Labs  Lab 12/14/17 1727  LIPASE 23   No results for input(s): AMMONIA in the last 168 hours.  ABG    Component Value Date/Time   PHART 7.490 (H) 12/15/2017 1048   PCO2ART 31.7 (L) 12/15/2017 1048   PO2ART 204.0 (H) 12/15/2017 1048   HCO3 24.2 12/15/2017 1048   TCO2 25 12/15/2017 1048   O2SAT 100.0 12/15/2017 1048     Coagulation Profile: Recent Labs  Lab 12/14/17 1727 12/15/17 0440  INR 2.35 1.33    Cardiac Enzymes: Recent Labs  Lab 12/16/17 1115 12/18/17 0341  CKTOTAL 135 272    HbA1C: No results found for: HGBA1C  CBG: Recent Labs  Lab 12/17/17 1119 12/17/17 1510 12/17/17 1928 12/17/17 2326 12/18/17 0348  GLUCAP 108* 143* 79 83 85    Resolved Hospital Problem list   Lactic Acidosis  Assessment & Plan:  Acute Hypoxic Respiratory Failure - Stable on vent. Still unclear etiology but likely multifactorial due to narcotics, aspiration, and post surgical respiratory depression - Daily monitoring for SBT - Remains agitated most likely due to not being on at home anti-spasmatic medications - Family is not wanting trach tube placed  Small Bowel Obstruction s/p Exploratory Laparotomy - General Surgery following; continue NG tube - Started TNA on 11/18; dosed per pharm - Cont airbed due to complications from skin ulcers and tissue breakdown from prior admissions - Cont D51/2NS @ 49ml/hr  UTI - Stable. Patient had clean U/A but grew >100k Pseudomonas colonies on UCx. - Ceftazidime 2g q8hrs started on 11/19 - cont to monitor clinically   Cerebral Palsy with chronic spasticity - Holding home meds: baclofen, flexeril, oral valium, zanaflex - Will resume home meds and titrate up slowly once he can receive PO - Robaxin IV  q8hrs - Vallium 2mg  q12 IV - Ativan 1mg  IV prn  DVT on chronic coumadin  - Cont Heparin today to transition to warfarin; pharmacy to dose - Follow INR  HTN - Holding home meds metoprolol and HCTZ - Stable for now, IV Lopressor for  SBP >170  Paroxymsal Atrial Fibrilation. - Stable. Holding metoprolol for now, continue to monitor heart rate, IV Lopressor for HR >  73 - Restart home meds once he is taking oral  Best practice:  Diet: NPO, NG tube in place Pain/Anxiety/Delirium protocol (if indicated): Ativan prn VAP protocol (if indicated): Initiated on 11/18 DVT prophylaxis: SDCs for now -> heparin to warfarin bridge GI prophylaxis: Pepcid 20mg  BID Mobility: bed/wheelchair bound Code Status: full Family Communication: Sister is HCPOA Disposition: ICU  Critical care time: > 30 min    Harolyn Rutherford, DO [place], PGY-2

## 2017-12-18 NOTE — Progress Notes (Signed)
3 Days Post-Op   Subjective/Chief Complaint: Intubated No acute changes   Objective: Vital signs in last 24 hours: Temp:  [98 F (36.7 C)-99.3 F (37.4 C)] 99.3 F (37.4 C) (11/21 0729) Pulse Rate:  [73-109] 91 (11/21 0700) Resp:  [13-20] 14 (11/21 0700) BP: (75-115)/(56-82) 101/69 (11/21 0700) SpO2:  [96 %-100 %] 100 % (11/21 0700) FiO2 (%):  [40 %] 40 % (11/21 0400) Last BM Date: (P) 12/15/17  Intake/Output from previous day: 11/20 0701 - 11/21 0700 In: 6393.5 [I.V.:5432.7; NG/GT:90; IV Piggyback:870.8] Out: 5409 [Urine:1105; Emesis/NG output:150] Intake/Output this shift: No intake/output data recorded.  Exam: On vent Abdomen distended, erythema of abdominal incision, mild serous drainage  Lab Results:  Recent Labs    12/17/17 1044 12/18/17 0341  WBC 4.8 6.1  HGB 10.4* 11.0*  HCT 32.5* 34.6*  PLT 141* 157   BMET Recent Labs    12/17/17 0541 12/18/17 0341  NA 135 135  K 4.0 3.9  CL 108 108  CO2 24 24  GLUCOSE 144* 96  BUN 11 12  CREATININE 0.66 0.63  CALCIUM 6.9* 7.0*   PT/INR No results for input(s): LABPROT, INR in the last 72 hours. ABG Recent Labs    12/15/17 1048  PHART 7.490*  HCO3 24.2    Studies/Results: Dg Abd Portable 1v  Result Date: 12/17/2017 CLINICAL DATA:  Assess nasogastric tube placement EXAM: PORTABLE ABDOMEN - 1 VIEW COMPARISON:  Abdominal radiograph of December 15, 2017 FINDINGS: The esophagogastric tube tip and proximal port project in the proximal gastric body and gastric cardia respectively. There remain loops of moderately distended bowel in the mid abdomen. The visualized portions of the left colon and rectum appear decompressed with a small amount of stool present. IMPRESSION: The esophagogastric tube tip and proximal port project in the gastric cardia-proximal body. Electronically Signed   By: David  Martinique M.D.   On: 12/17/2017 12:25   Vas [place] Lower Extremity Venous (dvt)  Result Date: 12/16/2017  Lower Venous  Study Indications: Swelling, and warmth.  Limitations: Poor ultrasound/tissue interface and patient position. Comparison Study: 10/17/16 Performing Technologist: Landry Mellow RDMS, RVT  Examination Guidelines: A complete evaluation includes B-mode imaging, spectral Doppler, color Doppler, and power Doppler as needed of all accessible portions of each vessel. Bilateral testing is considered an integral part of a complete examination. Limited examinations for reoccurring indications may be performed as noted.  Right Venous Findings: +---------+---------------+---------+-----------+----------+-------------------+          CompressibilityPhasicitySpontaneityPropertiesSummary             +---------+---------------+---------+-----------+----------+-------------------+ CFV      Full           Yes      Yes                                      +---------+---------------+---------+-----------+----------+-------------------+ SFJ      Full                                                             +---------+---------------+---------+-----------+----------+-------------------+ FV Prox  Full                                                             +---------+---------------+---------+-----------+----------+-------------------+  FV Mid                           Yes                                      +---------+---------------+---------+-----------+----------+-------------------+ FV Distal                                             not visualized due                                                        to medially turned                                                        leg                 +---------+---------------+---------+-----------+----------+-------------------+ PFV      Full                                                             +---------+---------------+---------+-----------+----------+-------------------+ POP      Full           Yes       Yes                                      +---------+---------------+---------+-----------+----------+-------------------+ PTV                                                   not visualized      +---------+---------------+---------+-----------+----------+-------------------+ PERO                                                  not visualized      +---------+---------------+---------+-----------+----------+-------------------+  Left Venous Findings: +---------+---------------+---------+-----------+----------+-------------------+          CompressibilityPhasicitySpontaneityPropertiesSummary             +---------+---------------+---------+-----------+----------+-------------------+ CFV                     Yes      Yes                  poorly visualized   +---------+---------------+---------+-----------+----------+-------------------+ FV Prox  Full  poorly visualized   +---------+---------------+---------+-----------+----------+-------------------+ FV Mid                                                not visualized      +---------+---------------+---------+-----------+----------+-------------------+ FV Distal                                             not visualized-                                                           poor tissue                                                               interface           +---------+---------------+---------+-----------+----------+-------------------+ POP                     Yes      Yes                                      +---------+---------------+---------+-----------+----------+-------------------+ PTV                                                   poorly visualized   +---------+---------------+---------+-----------+----------+-------------------+ PERO                                                  poorly visualized    +---------+---------------+---------+-----------+----------+-------------------+ AV malformation left groin    Summary: Right: There is no evidence of deep vein thrombosis in the lower extremity. However, portions of this examination were limited- see technologist comments above. Left: There is no evidence of deep vein thrombosis in the lower extremity. However, portions of this examination were limited- see technologist comments above. 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  *See table(s) above for measurements and observations. Electronically signed by Deitra Mayo MD on 12/16/2017 at 3:53:10 PM.    Final     Anti-infectives: Anti-infectives (From admission, onward)   Start     Dose/Rate Route Frequency Ordered Stop   12/17/17 1600  cefTAZidime (FORTAZ) 1 g in sodium chloride 0.9 % 100 mL IVPB  Status:  Discontinued     1 g 200 mL/hr over 30 Minutes Intravenous Every 8 hours 12/17/17 0800 12/17/17 0815   12/17/17 0900  cefTAZidime (FORTAZ) 1 g in sodium chloride 0.9 % 100 mL IVPB     1 g 200 mL/hr over 30 Minutes Intravenous Every 8  hours 12/17/17 0815 12/23/17 0959   12/16/17 1400  cefTAZidime (FORTAZ) 2 g in sodium chloride 0.9 % 100 mL IVPB  Status:  Discontinued     2 g 200 mL/hr over 30 Minutes Intravenous Every 8 hours 12/16/17 1125 12/17/17 0800   12/15/17 0300  vancomycin (VANCOCIN) IVPB 1000 mg/200 mL premix  Status:  Discontinued     1,000 mg 200 mL/hr over 60 Minutes Intravenous Every 8 hours 12/14/17 1844 12/15/17 1133   12/14/17 1900  vancomycin (VANCOCIN) 1,750 mg in sodium chloride 0.9 % 500 mL IVPB     1,750 mg 250 mL/hr over 120 Minutes Intravenous  Once 12/14/17 1844 12/14/17 2343   12/14/17 1845  ceFEPIme (MAXIPIME) 2 g in sodium chloride 0.9 % 100 mL IVPB  Status:  Discontinued     2 g 200 mL/hr over 30 Minutes Intravenous Every 8 hours 12/14/17 1844 12/15/17 1133   12/14/17 1800  ceFEPIme (MAXIPIME) 2 g in sodium chloride 0.9 % 100 mL IVPB  Status:   Discontinued     2 g 200 mL/hr over 30 Minutes Intravenous  Once 12/14/17 1753 12/14/17 1844   12/14/17 1800  metroNIDAZOLE (FLAGYL) IVPB 500 mg  Status:  Discontinued     500 mg 100 mL/hr over 60 Minutes Intravenous Every 8 hours 12/14/17 1753 12/15/17 1133   12/14/17 1800  vancomycin (VANCOCIN) IVPB 1000 mg/200 mL premix  Status:  Discontinued     1,000 mg 200 mL/hr over 60 Minutes Intravenous  Once 12/14/17 1753 12/14/17 1844      Assessment/Plan: s/p Procedure(s): EXPLORATORY LAPAROTOMY (N/A)  Post op ileus  Continue NPO and NG If incision continues to have increasing erythema, will have to remove staples   LOS: 4 days    Acelynn Dejonge A 12/18/2017

## 2017-12-18 NOTE — Progress Notes (Signed)
Attempted for the 2nd time to place pt on CPAP/PS for wean.  Pt did not tolerate.  Pt still too sleepy from sedation even after turning down.  RN at bedside as well.  RT will continue to monitor.

## 2017-12-18 NOTE — Progress Notes (Signed)
Pharmacy Antibiotic Note  [name] is a 37 y.o. male admitted on 12/14/2017 with SBO.Urine culture positive for Pseudomonas aeruginosa. UA was not consistent with a UTI. However, patient has multiple risk factors for UTI (foley catheter, and neurogenic bladder), and a history of Pseudomonas aeruginosa UTI (2017), so team supported decision to treat for Pseudomonas aeruginosa UTI.   Pharmacy has been consulted for ceftazidime dosing.  CrCL >120 mL/min, and stable.  Plan: Currently, patient is treated with ceftazidime 1g IV q8h. This patient's current antibiotics will be continued without adjustments.  Height: 5\' 5"  (165.1 cm) Weight: 222 lb 10.6 oz (101 kg) IBW/kg (Calculated) : 61.5  Temp (24hrs), Avg:98.6 F (37 C), Min:98 F (36.7 C), Max:99.3 F (37.4 C)  Recent Labs  Lab 12/14/17 1727  12/14/17 1945 12/14/17 2240 12/15/17 0440 12/15/17 0444 12/16/17 0303 12/16/17 1115 12/16/17 1527 12/17/17 0541 12/17/17 1044 12/18/17 0341  WBC 13.3*  --   --   --  6.0  --  4.9  --   --   --  4.8 6.1  CREATININE 1.14  --   --   --  0.92  --  0.94  --   --  0.66  --  0.63  LATICACIDVEN  --    < > 5.92* 3.01*  --  1.9  --  0.8 0.7  --   --   --    < > = values in this interval not displayed.    Estimated Creatinine Clearance: 136.9 mL/min (by C-G formula based on SCr of 0.63 mg/dL).    Allergies  Allergen Reactions  . Betadine [Povidone Iodine] Other (See Comments)    Unknown reaction per caregiver  . Ciprofloxacin Other (See Comments)    Unknown reaction per caregiver  . Morphine Other (See Comments)    Unknown reaction per caregiver    Antimicrobials this admission: Vancomycin  >> 11/17 - 11/18 Metronidazole >> 11/17 - 11/17 Cefepime >> 11/17 - 11/17 Ceftazidime >> 11/19 -   Dose adjustments this admission: N/a Continue ceftazidime 1g IV q8h.  Microbiology results: 11/18 BCx: No growth x 2d 11/17 UCx: Pseudomonas aeruginosa 11/18 MRSA PCR: Negative  Thank  you for allowing pharmacy to be a part of this patient's care.  Evalyn Casco 12/18/2017 9:10 AM

## 2017-12-18 NOTE — Progress Notes (Signed)
PHARMACY - ADULT TOTAL PARENTERAL NUTRITION CONSULT NOTE   Pharmacy Consult for TPN Indication: Nutrition s/p surgery for small bowel obstruction  Patient Measurements: Height: 5\' 5"  (165.1 cm) Weight: 222 lb 10.6 oz (101 kg) IBW/kg (Calculated) : 61.5  AdjBW = 68.8 kg   Body mass index is 37.05 kg/m. Usual Weight: 90.7 kg  Assessment:  Patient admitted 11/17 by ED p/w abdominal pain, N/V, lactic acidosis to 6, WBC 13, CT scan supportive of SBO. Pt required immediate surgery. 11/18 0208 s/p surgery for SBO; found diffusely dilated small intestine without transition point, small amount of adhesions, no ischemia. Patient extubated in OR, transferred to 55M,  intubated due to insufficient oxygenation at 0918. We are starting TPN because historically patient has had long post-op illeuses.  GI: s.p ExLap with LoA on 11/18; albumin 3.9 on admit. Pre-albumin 11.7. NG o/p 150 mL, NGT to LIWS  Endo: No h/o DM, BG <180 Insulin requirements in the past 24 hours: 1 u Lytes: Mg 2.3; Ca2+ 8.1 (corrected), K 3.9, Phos 2.7 Renal: SCr down to 0.63, UOP 0.5 ml/kg/hr . +13.7 L since admission  Pulm: Intubated, PRVC, FiO2 down to 40%, PEEP 5 Cards: MAP 70s, no pressors, NSR, BP soft, HR 90s Hepatobil: LFTs wnl, TG 96  Neuro: On fent @ 400, midazolam @ 5, Dex @ 0.8, baseline cerebal palsy, requires chronic antispasmodics. 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 Max concentrating TPN today.   Hold 20% lipid emulsion for first 7 days for ICU patients per ASPEN guidelines (Lipid start date 11/25) This TPN provides 125 g of protein, 180 g of dextrose, and 0 g of lipids which provides  1125 kCals per day, meeting 100% of patient needs. Electrolytes in TPN: Change Cl:Ac to 1:2 Add MVI, trace elements and famotidine to TPN  No insulin requirements on goal TPN - d/c CBG checks  Concentrate fentanyl drip  Monitor TPN labs, Phos, Mg, BMP.  Albertina Parr, PharmD., BCPS Clinical Pharmacist Clinical phone for 12/18/17 until 3:30pm: [phone] If after 3:30pm, please refer to [facility] for unit-specific pharmacist

## 2017-12-18 NOTE — Progress Notes (Signed)
[facility] for Heparin (warfarin on hold) Indication: DVT  Patient Measurements: Height: 5\' 5"  (165.1 cm) Weight: 222 lb 10.6 oz (101 kg) IBW/kg (Calculated) : 61.5 Heparin Dosing Weight: 84 kg  Vital Signs: Temp: 98.4 F (36.9 C) (11/21 0356) Temp Source: Oral (11/21 0356) BP: 101/72 (11/21 0600) Pulse Rate: 109 (11/21 0500)  Labs: Recent Labs    12/16/17 0303 12/16/17 1115 12/17/17 0541 12/17/17 1044 12/17/17 1957 12/18/17 0341  HGB 10.4*  --   --  10.4*  --  11.0*  HCT 33.1*  --   --  32.5*  --  34.6*  PLT 146*  --   --  141*  --  157  HEPARINUNFRC  --   --   --   --  0.29* 0.45  CREATININE 0.94  --  0.66  --   --  0.63  CKTOTAL  --  135  --   --   --  272   Assessment: 37 year old male with cerebral palsy. He was admitted with abdominal pain due to a SBO. He went for an ex lap which only showed a dilated small intestine without adhesions. He is currently on TPN while awaiting recovery of bowel function. 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 His INR and warfarin were reversed upon admission for surgery. Separately, he was found to have a urinary obstruction, he was a difficult cath placement and had some minor bleeding.   11/21 AM update: heparin level therapeutic x 1 after rate increase, CBC stable  Goal of Therapy:  Heparin level 0.3-0.7 units/ml Monitor platelets by anticoagulation protocol: Yes   Plan:  Cont heparin at 1450 units/hr Confirmatory heparin level at [place], PharmD, [place] Pharmacist Phone: [phone]

## 2017-12-19 ENCOUNTER — Inpatient Hospital Stay (HOSPITAL_COMMUNITY): Payer: Medicare Other

## 2017-12-19 DIAGNOSIS — R252 Cramp and spasm: Secondary | ICD-10-CM

## 2017-12-19 LAB — BASIC METABOLIC PANEL
Anion gap: 3 — ABNORMAL LOW (ref 5–15)
BUN: 13 mg/dL (ref 6–20)
CO2: 25 mmol/L (ref 22–32)
Calcium: 7.3 mg/dL — ABNORMAL LOW (ref 8.9–10.3)
Chloride: 107 mmol/L (ref 98–111)
Creatinine, Ser: 0.71 mg/dL (ref 0.61–1.24)
GFR calc non Af Amer: >60 mL/min (ref >60)
GLUCOSE: 122 mg/dL — AB (ref 70–99)
POTASSIUM: 4.2 mmol/L (ref 3.5–5.1)
SODIUM: 135 mmol/L (ref 135–145)

## 2017-12-19 LAB — CBC
HCT: 32.8 % — ABNORMAL LOW (ref 39.0–52.0)
Hemoglobin: 10.3 g/dL — ABNORMAL LOW (ref 13.0–17.0)
MCH: 29 pg (ref 26.0–34.0)
MCHC: 31.4 g/dL (ref 30.0–36.0)
MCV: 92.4 fL (ref 80.0–100.0)
PLATELETS: 177 10*3/uL (ref 150–400)
RBC: 3.55 MIL/uL — AB (ref 4.22–5.81)
RDW: 14 % (ref 11.5–15.5)
WBC: 6.3 10*3/uL (ref 4.0–10.5)
nRBC: 0 % (ref 0.0–0.2)

## 2017-12-19 LAB — CULTURE, BLOOD (ROUTINE X 2)
CULTURE: NO GROWTH
SPECIAL REQUESTS: ADEQUATE

## 2017-12-19 LAB — PHOSPHORUS: Phosphorus: 2.2 mg/dL — ABNORMAL LOW (ref 2.5–4.6)

## 2017-12-19 LAB — HEPARIN LEVEL (UNFRACTIONATED): HEPARIN UNFRACTIONATED: 0.37 [IU]/mL (ref 0.30–0.70)

## 2017-12-19 MED ORDER — FENTANYL CITRATE (PF) 100 MCG/2ML IJ SOLN
INTRAMUSCULAR | Status: AC
  Filled 2017-12-19: qty 2

## 2017-12-19 MED ORDER — NALOXONE HCL 0.4 MG/ML IJ SOLN
0.4000 mg | INTRAMUSCULAR | Status: DC | PRN
  Administered 2017-12-19: 0.4 mg via INTRAVENOUS

## 2017-12-19 MED ORDER — NALOXONE HCL 0.4 MG/ML IJ SOLN
INTRAMUSCULAR | Status: AC
  Filled 2017-12-19: qty 1

## 2017-12-19 MED ORDER — FENTANYL BOLUS VIA INFUSION
50.0000 ug | INTRAVENOUS | Status: DC | PRN
  Filled 2017-12-19: qty 200

## 2017-12-19 MED ORDER — TRACE MINERALS CR-CU-MN-SE-ZN 10-1000-500-60 MCG/ML IV SOLN
INTRAVENOUS | Status: AC
  Administered 2017-12-19: 18:00:00 via INTRAVENOUS
  Filled 2017-12-19: qty 840

## 2017-12-19 MED ORDER — SODIUM CHLORIDE 0.9 % IV SOLN
0.5000 mg/h | INTRAVENOUS | Status: DC
  Filled 2017-12-19: qty 10

## 2017-12-19 MED ORDER — MIDAZOLAM HCL 2 MG/2ML IJ SOLN
2.0000 mg | Freq: Once | INTRAMUSCULAR | Status: AC
  Administered 2017-12-19: 2 mg via INTRAVENOUS

## 2017-12-19 MED ORDER — ETOMIDATE 2 MG/ML IV SOLN
0.3000 mg/kg | Freq: Once | INTRAVENOUS | Status: AC
  Administered 2017-12-19: 20 mg via INTRAVENOUS

## 2017-12-19 MED ORDER — FENTANYL BOLUS VIA INFUSION
25.0000 ug | INTRAVENOUS | Status: DC | PRN
  Filled 2017-12-19: qty 100

## 2017-12-19 MED ORDER — ROCURONIUM BROMIDE 50 MG/5ML IV SOLN
1.0000 mg/kg | Freq: Once | INTRAVENOUS | Status: AC
  Administered 2017-12-19: 50 mg via INTRAVENOUS

## 2017-12-19 MED ORDER — MIDAZOLAM HCL 2 MG/2ML IJ SOLN
INTRAMUSCULAR | Status: AC
  Filled 2017-12-19: qty 2

## 2017-12-19 MED ORDER — FENTANYL CITRATE (PF) 100 MCG/2ML IJ SOLN
100.0000 ug | Freq: Once | INTRAMUSCULAR | Status: AC
  Administered 2017-12-19: 100 ug via INTRAVENOUS

## 2017-12-19 NOTE — Procedures (Signed)
Extubation Procedure Note  Patient Details:   Name: [name] DOB: 17-Jan-1981 MRN: [id number]   Airway Documentation:    Vent end date: 12/19/17 Vent end time: 0930   Evaluation  O2 sats: transiently fell during during procedure Complications: No apparent complications Patient did not tolerate procedure well. Bilateral Breath Sounds: Rhonchi   No, Pt bit pilot ballon had to extubate  Gonzella Lex 12/19/2017, 10:32 AM

## 2017-12-19 NOTE — Progress Notes (Signed)
NAME:  [name], MRN:  [id number], DOB:  January 30, 1980, LOS: 5 ADMISSION DATE:  12/14/2017, CONSULTATION DATE:  12/15/2017 REFERRING MD:  N/A, CHIEF COMPLAINT:  Abd pain with N/V  Brief History   [name] is a 37y/o male with PMH of Cerebral palsy, DVT on coumadin, HTN, and chronic constipation. He has also had a history of skin ulceration and breakdown.  Past Medical History Cerebral palsy, SBO s/p exploratory lap, DVT on coumadin, HTN, Neurogenic bladder  Significant Hospital Events   11/18 - Exploratory Laparotomy with lysis of adhesions 11/18 - Desaturation into 50s and initially unresponsive, now intubated and sedated 11/18 - Central Line placed, TPN started 11/19 - Foley placed requiring Urology 11/19 - UCx grew >100k Pseudomonas Aerg. 11/19 - Bil. Duplex U/S of LE neg for DVTs 11/20 - EEG: slowly most likely due to sedating medications; no signs of seizure activity  Consults:  [place]  Procedures:  Exploratory Lapartomy Bilateral doppler LE U/S EGG  Significant Diagnostic Tests:  CT Abd: Dilated small bowel with air-fluid levels to the distal small bowel compatible with distal small bowel obstruction. EEG: slowing most likely due to sedating medications; no signs of seizure acitivity  Micro Data:  BCx: 1/2 positive for Staph Aureus; likely contaminate cont to monitor Urine Cx: >100k Pseudomonas Aeruginosa MRSA screen: neg  Antimicrobials:  None  Interim history/subjective:  Patient has been successfully weaned off sedation and is tolerating it well. He is alert and responds with head movements. NG tube came out this am, will replace.  Objective   Blood pressure 113/66, pulse 82, temperature 98.2 F (36.8 C), temperature source Oral, resp. rate 14, height 5\' 5"  (1.651 m), weight 101 kg, SpO2 (!) 87 %. CVP:  [14 mmHg-21 mmHg] 21 mmHg  Vent Mode: PRVC FiO2 (%):  [40 %] 40 % Set Rate:  [14 bmp] 14 bmp Vt Set:  [490 mL] 490 mL PEEP:  [5 cmH20] 5  cmH20 Plateau Pressure:  [16 cmH20-28 cmH20] 23 cmH20   Intake/Output Summary (Last 24 hours) at 12/19/2017 0718 Last data filed at 12/19/2017 0700 Gross per 24 hour  Intake 5009.03 ml  Output 3675 ml  Net 1334.03 ml   Filed Weights   12/14/17 1823 12/17/17 0500  Weight: 90.7 kg 101 kg    Examination: General: alert, responds to verbal commands with with head movements HENT: NG tube in place, ETT in place Lungs: CTAB no wheezing or rhonchi Cardiovascular: RRR, no murmurs Abdomen: soft, mild distention, non-tender, midline incision clean, dry, no oozing or bleeding, +bowel sounds Extremities: air cushions on both feet, left leg remains swollen and edematous Skin: no rashes Neuro: RASS 0 to -1 GU: foley in place, no drainage or blood  Labs   CBC: Recent Labs  Lab 12/14/17 1727 12/15/17 0440 12/16/17 0303 12/17/17 1044 12/18/17 0341 12/19/17 0516  WBC 13.3* 6.0 4.9 4.8 6.1 6.3  NEUTROABS 11.9*  --  3.0  --   --   --   HGB 15.5 13.1 10.4* 10.4* 11.0* 10.3*  HCT 48.0 41.5 33.1* 32.5* 34.6* 32.8*  MCV 90.4 90.4 93.0 91.5 93.0 92.4  PLT 241 230 146* 141* 157 782    Basic Metabolic Panel: Recent Labs  Lab 12/14/17 1939 12/15/17 0440 12/16/17 0303 12/17/17 0541 12/18/17 0341 12/19/17 0516  NA  --  138 136 135 135 135  K  --  3.8 3.4* 4.0 3.9 4.2  CL  --  106 106 108 108 107  CO2  --  24 26 24 24 25   GLUCOSE  --  154* 151* 144* 96 122*  BUN  --  12 12 11 12 13   CREATININE  --  0.92 0.94 0.66 0.63 0.71  CALCIUM  --  7.8* 7.1* 6.9* 7.0* 7.3*  MG 2.0 1.5* 1.9 1.8 2.3  --   PHOS  --   --  1.3* 2.2* 2.7 2.2*   GFR: Estimated Creatinine Clearance: 136.9 mL/min (by C-G formula based on SCr of 0.71 mg/dL). Recent Labs  Lab 12/14/17 1939  12/14/17 2240  12/15/17 0444 12/16/17 0303 12/16/17 1115 12/16/17 1527 12/17/17 1044 12/18/17 0341 12/19/17 0516  PROCALCITON 0.77  --   --   --   --   --   --   --   --   --   --   WBC  --   --   --    < >  --  4.9  --    --  4.8 6.1 6.3  LATICACIDVEN  --    < > 3.01*  --  1.9  --  0.8 0.7  --   --   --    < > = values in this interval not displayed.    Liver Function Tests: Recent Labs  Lab 12/14/17 1727 12/16/17 0303 12/18/17 0341  AST 35 17 17  ALT 28 15 13   ALKPHOS 65 39 45  BILITOT 1.4* 1.1 0.3  PROT 7.3 4.4* 4.5*  ALBUMIN 3.9 2.3* 2.1*   Recent Labs  Lab 12/14/17 1727  LIPASE 23   No results for input(s): AMMONIA in the last 168 hours.  ABG    Component Value Date/Time   PHART 7.490 (H) 12/15/2017 1048   PCO2ART 31.7 (L) 12/15/2017 1048   PO2ART 204.0 (H) 12/15/2017 1048   HCO3 24.2 12/15/2017 1048   TCO2 25 12/15/2017 1048   O2SAT 100.0 12/15/2017 1048     Coagulation Profile: Recent Labs  Lab 12/14/17 1727 12/15/17 0440  INR 2.35 1.33    Cardiac Enzymes: Recent Labs  Lab 12/16/17 1115 12/18/17 0341  CKTOTAL 135 272    HbA1C: No results found for: HGBA1C  CBG: Recent Labs  Lab 12/17/17 1928 12/17/17 2326 12/18/17 0348 12/18/17 0727 12/18/17 1513  GLUCAP 79 83 85 78 98    Resolved Hospital Problem list   Lactic Acidosis  Assessment & Plan:  Acute Hypoxic Respiratory Failure - Stable on vent. Still unclear etiology but likely multifactorial due to narcotics, aspiration, and post surgical respiratory depression - Weaning down on vent - Daily monitoring for SBT - Remains agitated most likely due to not being on at home anti-spasmatic medications - Family is not wanting trach tube placed  Small Bowel Obstruction s/p Exploratory Laparotomy - General Surgery following; continue NG tube; do not want to give oral trial until he shows bowel function - Started TNA on 11/18; dosed per pharm - Cont airbed due to complications from skin ulcers and tissue breakdown from prior admissions - Cont D51/2NS @ 45ml/hr  UTI - Stable. Patient had clean U/A but grew >100k Pseudomonas colonies on UCx. - Ceftazidime 2g q8hrs started on 11/19 - cont to monitor  clinically   Cerebral Palsy with chronic spasticity - Holding home meds: baclofen, flexeril, oral valium, zanaflex - Will resume home meds and titrate up slowly once he can receive PO - Robaxin IV q8hrs - Vallium 2mg  q12 IV - Ativan 1mg  IV prn  DVT on chronic coumadin  - Cont Heparin today to  transition to warfarin; pharmacy to dose - Follow INR  HTN - Holding home meds metoprolol and HCTZ - Stable for now, IV Lopressor for  SBP >170  Paroxymsal Atrial Fibrilation. - Stable. Holding metoprolol for now, continue to monitor heart rate, IV Lopressor for HR > 120 - Restart home meds once he is taking oral  Best practice:  Diet: NPO, NG tube in place Pain/Anxiety/Delirium protocol (if indicated): Ativan prn VAP protocol (if indicated): Initiated on 11/18 DVT prophylaxis: SDCs for now -> heparin to warfarin bridge GI prophylaxis: Pepcid 20mg  BID Mobility: bed/wheelchair bound Code Status: full Family Communication: Sister is HCPOA Disposition: ICU  Critical care time: > 30 min    Harolyn Rutherford, DO [place], PGY-2

## 2017-12-19 NOTE — Progress Notes (Addendum)
ANTICOAGULATION CONSULT NOTE - Follow Up Consult  Pharmacy Consult for heparin Indication: DVT  Allergies  Allergen Reactions  . Betadine [Povidone Iodine] Other (See Comments)    Unknown reaction per caregiver  . Ciprofloxacin Other (See Comments)    Unknown reaction per caregiver  . Morphine Other (See Comments)    Unknown reaction per caregiver    Patient Measurements: Height: _0  (165.1 cm) Weight: 222 lb 10.6 oz (101 kg) IBW/kg (Calculated) : 61.5 Heparin Dosing Weight: 84 kg  Vital Signs: Temp: 98.2 F (36.8 C) (11/22 0326) Temp Source: Oral (11/22 0326) BP: 113/66 (11/22 0700) Pulse Rate: 82 (11/22 0700)  Labs: Recent Labs    12/16/17 1115 12/17/17 0541  12/17/17 1044 12/17/17 1957 12/18/17 0341 12/18/17 1300 12/19/17 0516  HGB  --   --    < > 10.4*  --  11.0*  --  10.3*  HCT  --   --   --  32.5*  --  34.6*  --  32.8*  PLT  --   --   --  141*  --  157  --  177  HEPARINUNFRC  --   --   --   --  0.29* 0.45 0.38  --   CREATININE  --  0.66  --   --   --  0.63  --  0.71  CKTOTAL 135  --   --   --   --  272  --   --    < > = values in this interval not displayed.    Estimated Creatinine Clearance: 136.9 mL/min (by C-G formula based on SCr of 0.71 mg/dL).   Medications:  Scheduled:  . chlorhexidine gluconate (MEDLINE KIT)  15 mL Mouth Rinse BID  . Chlorhexidine Gluconate Cloth  6 each Topical Daily  . diazepam  2 mg Intravenous BID  . etomidate  20 mg Intravenous Once  . mouth rinse  15 mL Mouth Rinse 10 times per day  . sodium chloride flush  10-40 mL Intracatheter Q12H    Assessment: 37 year old male with cerebral palsy. He was admitted with abdominal pain due to a SBO. He went for an ex lap which only showed a dilated small intestine without adhesions. He is currently on TPN while awaiting recovery of bowel function. 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 His INR and warfarin were reversed upon admission for surgery. Separately, he was found to  have a urinary obstruction, he was a difficult cath placement and had some meatal bleeding. This is now resolved. He was placed on heparin sq for prophylaxis 11/19, which he has tolerated. He was placed on a heparin infusion 11/20, to be continued until warfarin can be safely resumed. Patient has not been cleared for oral medications by surgery yet.  11/20 Heparin infusion 1300 units/hour >> HL 0.29,  Increased to heparin 1450 units/hour >> HL 0.45 & 0.38 on 11/21, and HL 0.37 on 11/22.  Patient current therapeutic & @ goal HL.    Goal of Therapy:  Heparin level 0.3-0.7 units/ml Monitor platelets by anticoagulation protocol: Yes   Plan:  Continue current heparin infusion of 1450 units/hour. Continue to monitor H&H and platelets, & HL daily.  Continue to assess when oral medications can be reintroduced.  [name] 12/19/2017,8:48 AM

## 2017-12-19 NOTE — Progress Notes (Signed)
PHARMACY - ADULT TOTAL PARENTERAL NUTRITION CONSULT NOTE   Pharmacy Consult for TPN Indication: Nutrition s/p surgery for small bowel obstruction  Patient Measurements: Height: 5\' 5"  (165.1 cm) Weight: 222 lb 10.6 oz (101 kg) IBW/kg (Calculated) : 61.5  AdjBW = 68.8 kg   Body mass index is 37.05 kg/m. Usual Weight: 90.7 kg  Assessment:  Patient admitted 11/17 by ED p/w abdominal pain, N/V, lactic acidosis to 6, WBC 13, CT scan supportive of SBO. Pt required immediate surgery. 11/18 0208 s/p surgery for SBO; found diffusely dilated small intestine without transition point, small amount of adhesions, no ischemia. Patient extubated in OR, transferred to 22M,  intubated due to insufficient oxygenation at 0918. We are starting TPN because historically patient has had long post-op illeuses.  GI: s.p ExLap with LoA on 11/18; albumin 3.9 on admit. Pre-albumin 11.7. NG o/p 150 mL, NGT to LIWS  Endo: No h/o DM, BG <180 Insulin requirements in the past 24 hours: 0 Lytes: Na 135, K 4.2 Renal: SCr down to 0.71, UOP 3.5 L . +15 L since admission  Pulm: Intubated, PRVC, FiO2 down to 40%, PEEP 5 Cards: MAP 70s, no pressors, NSR, BP soft, HR 90s Hepatobil: LFTs wnl, TG 96  Neuro: On fent @ 50, Dex @ 1, baseline cerebal palsy, requires chronic antispasmodics. Now with concern for new seizures ID: WBC 6.3, on ceftazidime for pseudomonas UTI  Heme: Pt on warfarin PTA for hx DVTs- currently on hold  - on IV Hep  TPN Access: PICC 11/18>> TPN start date: 11/18 Nutritional Goals (per RD assessment on 11/19): Kcal:  8638-1771 Protein:  124 gm Fluid:  1.5-2 L  Current Nutrition:  TPN @ 70 mL/hr   Plan:  Continue TPN at 50 mL/hr. Max concentrating TPN today.   Hold 20% lipid emulsion for first 7 days for ICU patients per ASPEN guidelines (Lipid start date 11/25) This TPN provides 125 g of protein, 180 g of dextrose, and 0 g of lipids which provides 1125 kCals per day, meeting 100% of patient  needs. Electrolytes in TPN: Change Cl:Ac to 1:2 Continue MVI, trace elements and famotidine to TPN  No insulin requirements on goal TPN Monitor TPN labs, Phos, Mg, BMP - no labs ordered for Sat 11/23  Levester Fresh, PharmD, BCPS, BCCCP Clinical Pharmacist [phone]  Please check AMION for all [facility] numbers  12/19/2017 8:53 AM

## 2017-12-19 NOTE — Procedures (Signed)
Intubation Procedure Note [name] [id number] Sep 09, 1980  Procedure: Intubation Indications: Respiratory insufficiency  Procedure Details Consent: Risks of procedure as well as the alternatives and risks of each were explained to the (patient/caregiver).  Consent for procedure obtained. Time Out: Verified patient identification, verified procedure, site/side was marked, verified correct patient position, special equipment/implants available, medications/allergies/relevent history reviewed, required imaging and test results available.  Performed  MAC and 4 Medications:  Fentanyl 100 mcg Etomidate 20 mg Versed 2 mg NMB rocuronium 50 mg   Evaluation Hemodynamic Status: BP stable throughout; O2 sats: stable throughout Patient's Current Condition: stable Complications: No apparent complications Patient did tolerate procedure well. Chest X-ray ordered to verify placement.  CXR: pending.   [name] ACNP [facility] PCCM Pager [phone] till 3 pm If no answer page [phone] 12/19/2017, 9:47 AM

## 2017-12-19 NOTE — Progress Notes (Signed)
  Interdisciplinary Goals of Care Family Meeting   Date carried out:: 12/19/2017  Location of the meeting: Bedside  Member's involved: Physician, Family Member or next of kin and Other: dad and mom  Durable Power of Tour manager: parents    Discussion: We discussed goals of care for [employer] .  - they are concerned about the prolonged intubated state. Explained ileus barrier to give his anti spasmodics. They want fent gtt turned off and they are ok with benzo gtt with precedex gtt. They feel opioids contributing to ileus. They also want repeat duplex LE. They disagree with caretaker assssment LLE swellin > RLE is baseline  They did say patient is very strong and breaks wheelchairs   Code status: Full Code  Disposition: Continue current acute care  Time spent for the meeting: 15 min  Martinique Pizzimenti 12/19/2017, 3:03 PM

## 2017-12-19 NOTE — Progress Notes (Signed)
[facility] Progress Note  4 Days Post-Op  Subjective: CC-  Intubated, weaning some.  Patient pulled NG out over night, about to have it replaced. No BM.  Objective: Vital signs in last 24 hours: Temp:  [98.2 F (36.8 C)-102 F (38.9 C)] 98.2 F (36.8 C) (11/22 0326) Pulse Rate:  [80-142] 82 (11/22 0700) Resp:  [11-16] 14 (11/22 0700) BP: (90-130)/(59-115) 113/66 (11/22 0700) SpO2:  [86 %-100 %] 86 % (11/22 0808) FiO2 (%):  [40 %] 40 % (11/22 0808) Last BM Date: 12/15/17  Intake/Output from previous day: 11/21 0701 - 11/22 0700 In: 5009 [I.V.:4305.5; IV Piggyback:703.5] Out: 3675 [Urine:3125; Emesis/NG output:550] Intake/Output this shift: No intake/output data recorded.  PE: Gen:  Alert, agitated HEENT: EOM's intact, pupils equal and round Pulm:  Mechanically ventilated Abd: distended but soft, nontender, few BS heard, midline incision with trace serous drainage and mild surrounding erythema/staples intact  Lab Results:  Recent Labs    12/18/17 0341 12/19/17 0516  WBC 6.1 6.3  HGB 11.0* 10.3*  HCT 34.6* 32.8*  PLT 157 177   BMET Recent Labs    12/18/17 0341 12/19/17 0516  NA 135 135  K 3.9 4.2  CL 108 107  CO2 24 25  GLUCOSE 96 122*  BUN 12 13  CREATININE 0.63 0.71  CALCIUM 7.0* 7.3*   PT/INR No results for input(s): LABPROT, INR in the last 72 hours. CMP     Component Value Date/Time   NA 135 12/19/2017 0516   K 4.2 12/19/2017 0516   CL 107 12/19/2017 0516   CO2 25 12/19/2017 0516   GLUCOSE 122 (H) 12/19/2017 0516   BUN 13 12/19/2017 0516   CREATININE 0.71 12/19/2017 0516   CREATININE 1.00 07/26/2015 1426   CALCIUM 7.3 (L) 12/19/2017 0516   PROT 4.5 (L) 12/18/2017 0341   ALBUMIN 2.1 (L) 12/18/2017 0341   AST 17 12/18/2017 0341   ALT 13 12/18/2017 0341   ALKPHOS 45 12/18/2017 0341   BILITOT 0.3 12/18/2017 0341   GFRNONAA >60 12/19/2017 0516   GFRNONAA >89 07/26/2015 1426   GFRAA >60 12/19/2017 0516   GFRAA >89  07/26/2015 1426   Lipase     Component Value Date/Time   LIPASE 23 12/14/2017 1727       Studies/Results: Dg Abd Portable 1v  Result Date: 12/19/2017 CLINICAL DATA:  NG tube placement EXAM: PORTABLE ABDOMEN - 1 VIEW COMPARISON:  12/17/2017 FINDINGS: Enteric tube tip is in the left upper quadrant consistent with location in the body of the stomach. IMPRESSION: Enteric tube tip is in the left upper quadrant consistent with location in the body of the stomach. Electronically Signed   By: Lucienne Capers M.D.   On: 12/19/2017 00:08   Dg Abd Portable 1v  Result Date: 12/17/2017 CLINICAL DATA:  Assess nasogastric tube placement EXAM: PORTABLE ABDOMEN - 1 VIEW COMPARISON:  Abdominal radiograph of December 15, 2017 FINDINGS: The esophagogastric tube tip and proximal port project in the proximal gastric body and gastric cardia respectively. There remain loops of moderately distended bowel in the mid abdomen. The visualized portions of the left colon and rectum appear decompressed with a small amount of stool present. IMPRESSION: The esophagogastric tube tip and proximal port project in the gastric cardia-proximal body. Electronically Signed   By: David  Martinique M.D.   On: 12/17/2017 12:25    Anti-infectives: Anti-infectives (From admission, onward)   Start     Dose/Rate Route Frequency Ordered Stop   12/17/17 1600  cefTAZidime (  FORTAZ) 1 g in sodium chloride 0.9 % 100 mL IVPB  Status:  Discontinued     1 g 200 mL/hr over 30 Minutes Intravenous Every 8 hours 12/17/17 0800 12/17/17 0815   12/17/17 0900  cefTAZidime (FORTAZ) 1 g in sodium chloride 0.9 % 100 mL IVPB     1 g 200 mL/hr over 30 Minutes Intravenous Every 8 hours 12/17/17 0815 12/23/17 0959   12/16/17 1400  cefTAZidime (FORTAZ) 2 g in sodium chloride 0.9 % 100 mL IVPB  Status:  Discontinued     2 g 200 mL/hr over 30 Minutes Intravenous Every 8 hours 12/16/17 1125 12/17/17 0800   12/15/17 0300  vancomycin (VANCOCIN) IVPB 1000 mg/200  mL premix  Status:  Discontinued     1,000 mg 200 mL/hr over 60 Minutes Intravenous Every 8 hours 12/14/17 1844 12/15/17 1133   12/14/17 1900  vancomycin (VANCOCIN) 1,750 mg in sodium chloride 0.9 % 500 mL IVPB     1,750 mg 250 mL/hr over 120 Minutes Intravenous  Once 12/14/17 1844 12/14/17 2343   12/14/17 1845  ceFEPIme (MAXIPIME) 2 g in sodium chloride 0.9 % 100 mL IVPB  Status:  Discontinued     2 g 200 mL/hr over 30 Minutes Intravenous Every 8 hours 12/14/17 1844 12/15/17 1133   12/14/17 1800  ceFEPIme (MAXIPIME) 2 g in sodium chloride 0.9 % 100 mL IVPB  Status:  Discontinued     2 g 200 mL/hr over 30 Minutes Intravenous  Once 12/14/17 1753 12/14/17 1844   12/14/17 1800  metroNIDAZOLE (FLAGYL) IVPB 500 mg  Status:  Discontinued     500 mg 100 mL/hr over 60 Minutes Intravenous Every 8 hours 12/14/17 1753 12/15/17 1133   12/14/17 1800  vancomycin (VANCOCIN) IVPB 1000 mg/200 mL premix  Status:  Discontinued     1,000 mg 200 mL/hr over 60 Minutes Intravenous  Once 12/14/17 1753 12/14/17 1844       Assessment/Plan 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 - POD 4 - BID dry dressing changes to midline wound due to serous drainage/trace surrounding erythema - no bowel function  ID - currently fortaz 11/19>> FEN - IVF, NPO/NGT to LIWS, TNA VTE - SCDs, sq heparin Foley - in place/per urology  Plan: Persistent ileus, NG tube being replaced as patient pulled it out. Will watch for follow up abdominal xray. Continue TNA. Monitor midline wound closely as we may need to remove staples in a few days if drainage or erythema worsens.   LOS: 5 days     Wellington Hampshire , [facility] 12/19/2017, 8:12 AM Pager: [phone] Mon 7:00 am -11:30 AM Tues-Fri 7:00 am-4:30 pm Sat-Sun 7:00 am-11:30 am

## 2017-12-19 NOTE — Procedures (Signed)
Intubation Procedure Note Khallid Pasillas [id number] Mar 21, 1980   Procedure: Intubation Indications   Procedure Details Consent: Unable to obtain consent because of emergent medical necessity. Time Out: Verified patient identification, verified procedure, site/side was marked, verified correct patient position, special equipment/implants available, medications/allergies/relevent history reviewed, required imaging and test results available.  Performed  Maximum sterile technique was used including cap, gloves, gown, hand hygiene and mask.  4    Evaluation Hemodynamic Status: BP stable throughout; O2 sats: stable throughout Patient's Current Condition: stable Complications: No apparent complications Patient did tolerate procedure well. Chest X-ray ordered to verify placement.  CXR: pending.     Gonzella Lex 12/19/2017

## 2017-12-20 ENCOUNTER — Inpatient Hospital Stay (HOSPITAL_COMMUNITY): Payer: Medicare Other

## 2017-12-20 DIAGNOSIS — M7989 Other specified soft tissue disorders: Secondary | ICD-10-CM

## 2017-12-20 DIAGNOSIS — R609 Edema, unspecified: Secondary | ICD-10-CM

## 2017-12-20 LAB — CULTURE, BLOOD (SINGLE)
Culture: NO GROWTH
Special Requests: ADEQUATE

## 2017-12-20 LAB — CBC
HEMATOCRIT: 31.6 % — AB (ref 39.0–52.0)
HEMOGLOBIN: 9.8 g/dL — AB (ref 13.0–17.0)
MCH: 28.7 pg (ref 26.0–34.0)
MCHC: 31 g/dL (ref 30.0–36.0)
MCV: 92.4 fL (ref 80.0–100.0)
Platelets: 196 10*3/uL (ref 150–400)
RBC: 3.42 MIL/uL — ABNORMAL LOW (ref 4.22–5.81)
RDW: 13.9 % (ref 11.5–15.5)
WBC: 5.4 10*3/uL (ref 4.0–10.5)
nRBC: 0 % (ref 0.0–0.2)

## 2017-12-20 LAB — GLUCOSE, CAPILLARY
GLUCOSE-CAPILLARY: 98 mg/dL (ref 70–99)
Glucose-Capillary: 125 mg/dL — ABNORMAL HIGH (ref 70–99)
Glucose-Capillary: 100 mg/dL — ABNORMAL HIGH (ref 70–99)
Glucose-Capillary: 102 mg/dL — ABNORMAL HIGH (ref 70–99)

## 2017-12-20 LAB — HEPARIN LEVEL (UNFRACTIONATED): Heparin Unfractionated: 0.36 IU/mL (ref 0.30–0.70)

## 2017-12-20 LAB — BASIC METABOLIC PANEL
Anion gap: 4 — ABNORMAL LOW (ref 5–15)
BUN: 15 mg/dL (ref 6–20)
CALCIUM: 7.7 mg/dL — AB (ref 8.9–10.3)
CO2: 25 mmol/L (ref 22–32)
CREATININE: 0.59 mg/dL — AB (ref 0.61–1.24)
Chloride: 105 mmol/L (ref 98–111)
GLUCOSE: 137 mg/dL — AB (ref 70–99)
Potassium: 3.9 mmol/L (ref 3.5–5.1)
Sodium: 134 mmol/L — ABNORMAL LOW (ref 135–145)

## 2017-12-20 LAB — PHOSPHORUS: PHOSPHORUS: 2.3 mg/dL — AB (ref 2.5–4.6)

## 2017-12-20 LAB — MAGNESIUM: Magnesium: 1.9 mg/dL (ref 1.7–2.4)

## 2017-12-20 MED ORDER — SODIUM CHLORIDE 0.9 % IV SOLN
0.5000 mg/h | INTRAVENOUS | Status: DC
  Administered 2017-12-20: 10 mg/h via INTRAVENOUS
  Administered 2017-12-21: 8 mg/h via INTRAVENOUS
  Administered 2017-12-22: 6 mg/h via INTRAVENOUS
  Administered 2017-12-23: 4 mg/h via INTRAVENOUS
  Filled 2017-12-20 (×5): qty 20

## 2017-12-20 MED ORDER — MAGNESIUM SULFATE 2 GM/50ML IV SOLN
2.0000 g | Freq: Once | INTRAVENOUS | Status: AC
  Administered 2017-12-20: 2 g via INTRAVENOUS

## 2017-12-20 MED ORDER — FUROSEMIDE 10 MG/ML IJ SOLN
40.0000 mg | Freq: Three times a day (TID) | INTRAMUSCULAR | Status: DC
  Administered 2017-12-20 – 2017-12-24 (×12): 40 mg via INTRAVENOUS
  Filled 2017-12-20 (×11): qty 4

## 2017-12-20 MED ORDER — SODIUM PHOSPHATES 45 MMOLE/15ML IV SOLN
30.0000 mmol | Freq: Once | INTRAVENOUS | Status: AC
  Administered 2017-12-20: 30 mmol via INTRAVENOUS
  Filled 2017-12-20: qty 10

## 2017-12-20 MED ORDER — TRACE MINERALS CR-CU-MN-SE-ZN 10-1000-500-60 MCG/ML IV SOLN
INTRAVENOUS | Status: AC
  Administered 2017-12-20: 17:00:00 via INTRAVENOUS
  Filled 2017-12-20: qty 840

## 2017-12-20 MED ORDER — MAGNESIUM SULFATE 2 GM/50ML IV SOLN
INTRAVENOUS | Status: AC
  Administered 2017-12-20: 2 g via INTRAVENOUS
  Filled 2017-12-20: qty 50

## 2017-12-20 MED ORDER — FUROSEMIDE 10 MG/ML IJ SOLN
INTRAMUSCULAR | Status: AC
  Administered 2017-12-20: 40 mg via INTRAVENOUS
  Filled 2017-12-20: qty 4

## 2017-12-20 MED ORDER — SODIUM CHLORIDE 0.9 % IV SOLN
0.5000 mg/h | INTRAVENOUS | Status: DC
  Administered 2017-12-20: 10 mg/h via INTRAVENOUS
  Filled 2017-12-20: qty 20

## 2017-12-20 NOTE — Progress Notes (Signed)
*  Preliminary Results* Bilateral lower extremity venous duplex completed. There is no obvious DVT in the bilateral lower extremities. There is no evidence of Baker's cyst bilaterally. This exam was very limited.   12/20/2017 3:05 PM Carling Liberman Dawna Part

## 2017-12-20 NOTE — Progress Notes (Signed)
PHARMACY - ADULT TOTAL PARENTERAL NUTRITION CONSULT NOTE   Pharmacy Consult for TPN Indication: Nutrition s/p surgery for small bowel obstruction  Patient Measurements: Height: 5\' 5"  (165.1 cm) Weight: 222 lb 10.6 oz (101 kg) IBW/kg (Calculated) : 61.5  AdjBW = 68.8 kg   Body mass index is 37.05 kg/m. Usual Weight: 90.7 kg  Assessment:  Patient admitted 11/17 by ED p/w abdominal pain, N/V, lactic acidosis to 6, WBC 13, CT scan supportive of SBO. Pt required immediate surgery. 11/18 0208 s/p surgery for SBO; found diffusely dilated small intestine without transition point, small amount of adhesions, no ischemia. Patient extubated in OR, transferred to 31M,  intubated due to insufficient oxygenation at 0918. We are starting TPN because historically patient has had long post-op illeuses.  GI: s.p ExLap with LoA on 11/18; albumin 3.9 on admit. Pre-albumin 11.7. NG o/p increasing 150>800 mL/d, NGT to LIWS  Endo: No h/o DM, BG <180 Insulin requirements in the past 24 hours: 0 Lytes: Na 134, K 3.9, Mg 1.9, phos 2.3 Renal: SCr 0.59, UOP 0.58mL/kg/hr . +16 L since admission  Pulm: Intubated, PRVC, FiO2 down to 40%, PEEP 5 Cards: MAP >60, no pressors, NSR, BP soft, HR 70s, CVP 13 Hepatobil: LFTs wnl, TG 96 (11/19) Neuro: Change from fent>versed @4 , Dex @ 1, baseline cerebal palsy, requires chronic antispasmodics. Now with concern for new seizures ID: WBC wnl, on ceftazidime for pseudomonas UTI  Heme: Pt on warfarin PTA for hx DVTs- currently on hold  - on IV Hep  TPN Access: PICC 11/18>> TPN start date: 11/18 Nutritional Goals (per RD assessment on 11/19): Kcal:  8676-1950 Protein:  124 gm Fluid:  1.5-2 L  Current Nutrition:  TPN @ 50 mL/hr   Plan:  Continue TPN at 50 mL/hr. Max conc TPN since 11/21.   Hold 20% lipid emulsion for first 7 days for ICU patients per ASPEN guidelines (Lipid start date 11/25) This TPN provides 126 g of protein, 180 g of dextrose, and 0 g of lipids which  provides 1116 kCals per day, meeting 100% of patient needs. Electrolytes in TPN: Cl:Ac 1:2, inc phos Fluids: D5W/1/2 NS + KCL 17meq/L @30  Continue MVI, trace elements and famotidine to TPN  No insulin requirements on goal TPN NaPhos 30 mmol IV x 1 F/u phos lvl in AM with BMP, TPN labs Monday  Bertis Ruddy, PharmD Clinical Pharmacist Please check AMION for all [facility] numbers 12/20/2017 7:18 AM

## 2017-12-20 NOTE — Progress Notes (Signed)
NAME:  Andre Swander, MRN:  [id number], DOB:  10-24-1980, LOS: 6 ADMISSION DATE:  12/14/2017, CONSULTATION DATE:  12/15/2017 REFERRING MD:  N/A, CHIEF COMPLAINT:  Abd pain with N/V  Brief History   Mr. Mitch Arquette is a 37y/o male with PMH of Cerebral palsy, DVT on coumadin, HTN, and chronic constipation. He has also had a history of skin ulceration and breakdown.  Past Medical History Cerebral palsy, SBO s/p exploratory lap, DVT on coumadin, HTN, Neurogenic bladder  Significant Hospital Events   11/18 - Exploratory Laparotomy with lysis of adhesions 11/18 - Desaturation into 50s and initially unresponsive, now intubated and sedated 11/18 - Central Line placed, TPN started 11/19 - Foley placed requiring Urology 11/19 - UCx grew >100k Pseudomonas Aerg. 11/19 - Bil. Duplex U/S of LE neg for DVTs 11/20 - EEG: slowly most likely due to sedating medications; no signs of seizure activity  Consults:  Sugery  Procedures:  Exploratory Lapartomy Bilateral doppler LE U/S EGG  Significant Diagnostic Tests:  CT Abd: Dilated small bowel with air-fluid levels to the distal small bowel compatible with distal small bowel obstruction. EEG: slowing most likely due to sedating medications; no signs of seizure acitivity  Micro Data:  BCx: 1/2 positive for Staph Aureus; likely contaminate cont to monitor Urine Cx: >100k Pseudomonas Aeruginosa MRSA screen: neg  Antimicrobials:  None  Interim history/subjective:  Weaning on vent this a.m. 20/5 Remains on sedation No acute events overnight NGT output (800cc x 24) , No BM -remains NPO   Objective   Blood pressure (!) 94/57, pulse 70, temperature 98.6 F (37 C), temperature source Axillary, resp. rate 13, height 5\' 5"  (1.651 m), weight 101 kg, SpO2 98 %. CVP:  [11 mmHg-15 mmHg] 11 mmHg  Vent Mode: PSV;CPAP FiO2 (%):  [40 %] 40 % Set Rate:  [14 bmp] 14 bmp Vt Set:  [490 mL] 490 mL PEEP:  [5 cmH20] 5 cmH20 Pressure Support:  [20  cmH20] 20 cmH20 Plateau Pressure:  [21 cmH20-24 cmH20] 22 cmH20   Intake/Output Summary (Last 24 hours) at 12/20/2017 1140 Last data filed at 12/20/2017 0800 Gross per 24 hour  Intake 3094.73 ml  Output 1900 ml  Net 1194.73 ml   Filed Weights   12/14/17 1823 12/17/17 0500  Weight: 90.7 kg 101 kg    Examination: General: Alert , opens eyes and responds to voice  HENT: Dry mucosa, ETT in place  Lungs: Diminished breath sounds in the bases  Cardiovascular: RRR, no MRG  Abdomen: Soft, obese, positive bowel sounds dressing intact, NG   Extremities: Air cushions on both feet.  Positive edema Skin: Intact without rash Neuro: Alert opens eyes, responds to voice GU: Foley in place  Labs   CBC: Recent Labs  Lab 12/14/17 1727  12/16/17 0303 12/17/17 1044 12/18/17 0341 12/19/17 0516 12/20/17 0403  WBC 13.3*   < > 4.9 4.8 6.1 6.3 5.4  NEUTROABS 11.9*  --  3.0  --   --   --   --   HGB 15.5   < > 10.4* 10.4* 11.0* 10.3* 9.8*  HCT 48.0   < > 33.1* 32.5* 34.6* 32.8* 31.6*  MCV 90.4   < > 93.0 91.5 93.0 92.4 92.4  PLT 241   < > 146* 141* 157 177 196   < > = values in this interval not displayed.    Basic Metabolic Panel: Recent Labs  Lab 12/15/17 0440 12/16/17 0303 12/17/17 0541 12/18/17 0341 12/19/17 0516 12/20/17 0403  NA 138  136 135 135 135 134*  K 3.8 3.4* 4.0 3.9 4.2 3.9  CL 106 106 108 108 107 105  CO2 24 26 24 24 25 25   GLUCOSE 154* 151* 144* 96 122* 137*  BUN 12 12 11 12 13 15   CREATININE 0.92 0.94 0.66 0.63 0.71 0.59*  CALCIUM 7.8* 7.1* 6.9* 7.0* 7.3* 7.7*  MG 1.5* 1.9 1.8 2.3  --  1.9  PHOS  --  1.3* 2.2* 2.7 2.2* 2.3*   GFR: Estimated Creatinine Clearance: 136.9 mL/min (A) (by C-G formula based on SCr of 0.59 mg/dL (L)). Recent Labs  Lab 12/14/17 1939  12/14/17 2240  12/15/17 0444  12/16/17 1115 12/16/17 1527 12/17/17 1044 12/18/17 0341 12/19/17 0516 12/20/17 0403  PROCALCITON 0.77  --   --   --   --   --   --   --   --   --   --   --   WBC  --    --   --    < >  --    < >  --   --  4.8 6.1 6.3 5.4  LATICACIDVEN  --    < > 3.01*  --  1.9  --  0.8 0.7  --   --   --   --    < > = values in this interval not displayed.    Liver Function Tests: Recent Labs  Lab 12/14/17 1727 12/16/17 0303 12/18/17 0341  AST 35 17 17  ALT 28 15 13   ALKPHOS 65 39 45  BILITOT 1.4* 1.1 0.3  PROT 7.3 4.4* 4.5*  ALBUMIN 3.9 2.3* 2.1*   Recent Labs  Lab 12/14/17 1727  LIPASE 23   No results for input(s): AMMONIA in the last 168 hours.  ABG    Component Value Date/Time   PHART 7.490 (H) 12/15/2017 1048   PCO2ART 31.7 (L) 12/15/2017 1048   PO2ART 204.0 (H) 12/15/2017 1048   HCO3 24.2 12/15/2017 1048   TCO2 25 12/15/2017 1048   O2SAT 100.0 12/15/2017 1048     Coagulation Profile: Recent Labs  Lab 12/14/17 1727 12/15/17 0440  INR 2.35 1.33    Cardiac Enzymes: Recent Labs  Lab 12/16/17 1115 12/18/17 0341  CKTOTAL 135 272    HbA1C: No results found for: HGBA1C  CBG: Recent Labs  Lab 12/17/17 2326 12/18/17 0348 12/18/17 0727 12/18/17 1513 12/20/17 0736  GLUCAP 83 85 78 98 125*    Resolved Hospital Problem list   Lactic Acidosis  Assessment & Plan:  Acute Hypoxic Respiratory Failure - Stable on vent. Still unclear etiology but likely multifactorial due to narcotics, aspiration, and post surgical respiratory depression - Weaning down on vent-tolerating some weaning this am.  - Daily monitoring for SBT - intermittent agitated most likely due to not being on at home anti-spasmatic medications - Family is not wanting trach tube placed  Small Bowel Obstruction s/p Exploratory Laparotomy - General Surgery following; continue NG tube; do not want to give oral trial until he shows bowel function - Started TNA on 11/18; dosed per pharm - Cont airbed due to complications from skin ulcers and tissue breakdown from prior admissions - Cont D51/2NS w/ KCL  @ 86ml/hr  UTI - Stable. Patient had clean U/A but grew >100k  Pseudomonas colonies on UCx. - Ceftazidime 2g q8hrs started on 11/19 - cont to monitor clinically   Cerebral Palsy with chronic spasticity - Holding home meds: baclofen, flexeril, oral valium, zanaflex - Will resume home  meds and titrate up slowly once he can receive PO - Robaxin IV q8hrs - Vallium 2mg  q12 IV - Ativan 1mg  IV prn  DVT on chronic coumadin  - Cont Heparin today to transition to warfarin; pharmacy to dose - Follow INR  HTN - Holding home meds metoprolol and HCTZ - Stable for now, IV Lopressor for  SBP >170  Paroxymsal Atrial Fibrilation. - Stable. Holding metoprolol for now, continue to monitor heart rate, IV Lopressor for HR > 120 - Restart home meds once he is taking oral  Best practice:  Diet: NPO, NG tube in place Pain/Anxiety/Delirium protocol (if indicated): Ativan prn VAP protocol (if indicated): Initiated on 11/18 DVT prophylaxis: SDCs for now -> heparin to warfarin bridge GI prophylaxis: Pepcid 20mg  BID Mobility: bed/wheelchair bound Code Status: full Family Communication: Sister is HCPOA Disposition: ICU  Critical care time: > 30 min   Lavontae Cornia NP-C  [facility]  [phone]   12/20/2017

## 2017-12-20 NOTE — Progress Notes (Signed)
[facility] Progress Note  5 Days Post-Op  Subjective: CC-  No bowel movements. NG out 800, bilious. Has not been able to wean from vent.   Objective: Vital signs in last 24 hours: Temp:  [97.4 F (36.3 C)-100 F (37.8 C)] 98.2 F (36.8 C) (11/23 0738) Pulse Rate:  [68-115] 68 (11/23 0804) Resp:  [14-15] 15 (11/23 0804) BP: (90-134)/(50-97) 96/56 (11/23 0804) SpO2:  [90 %-100 %] 98 % (11/23 0804) FiO2 (%):  [40 %] 40 % (11/23 0804) Last BM Date: 12/15/17  Intake/Output from previous day: 11/22 0701 - 11/23 0700 In: 3421.7 [I.V.:2971.8; IV Piggyback:450] Out: 2000 [Urine:1200; Emesis/NG output:800] Intake/Output this shift: No intake/output data recorded.  PE: Gen:  Sedated/ somnolent Pulm:  Mechanically ventilated Abd: distended but soft, nontender, midline incision clean dry and intact, no drainage on dressing this morning, faint reactive erythema  Lab Results:  Recent Labs    12/19/17 0516 12/20/17 0403  WBC 6.3 5.4  HGB 10.3* 9.8*  HCT 32.8* 31.6*  PLT 177 196   BMET Recent Labs    12/19/17 0516 12/20/17 0403  NA 135 134*  K 4.2 3.9  CL 107 105  CO2 25 25  GLUCOSE 122* 137*  BUN 13 15  CREATININE 0.71 0.59*  CALCIUM 7.3* 7.7*   PT/INR No results for input(s): LABPROT, INR in the last 72 hours. CMP     Component Value Date/Time   NA 134 (L) 12/20/2017 0403   K 3.9 12/20/2017 0403   CL 105 12/20/2017 0403   CO2 25 12/20/2017 0403   GLUCOSE 137 (H) 12/20/2017 0403   BUN 15 12/20/2017 0403   CREATININE 0.59 (L) 12/20/2017 0403   CREATININE 1.00 07/26/2015 1426   CALCIUM 7.7 (L) 12/20/2017 0403   PROT 4.5 (L) 12/18/2017 0341   ALBUMIN 2.1 (L) 12/18/2017 0341   AST 17 12/18/2017 0341   ALT 13 12/18/2017 0341   ALKPHOS 45 12/18/2017 0341   BILITOT 0.3 12/18/2017 0341   GFRNONAA >60 12/20/2017 0403   GFRNONAA >89 07/26/2015 1426   GFRAA >60 12/20/2017 0403   GFRAA >89 07/26/2015 1426   Lipase     Component Value Date/Time    LIPASE 23 12/14/2017 1727       Studies/Results: Portable Chest X-ray  Result Date: 12/19/2017 CLINICAL DATA:  Post intubation EXAM: PORTABLE CHEST 1 VIEW COMPARISON:  12/16/2017 FINDINGS: Endotracheal tube is at the level of the carina. Very low lung volumes with bibasilar atelectasis. Possible layering effusions. Heart is borderline in size. NG tube is in the stomach. IMPRESSION: Endotracheal tube near the level of the carina. Very low lung volumes with bibasilar atelectasis and suspected layering effusions. Electronically Signed   By: Rolm Baptise M.D.   On: 12/19/2017 10:29   Dg Abd Portable 1v  Result Date: 12/19/2017 CLINICAL DATA:  NG tube placement EXAM: PORTABLE ABDOMEN - 1 VIEW COMPARISON:  12/17/2017 FINDINGS: Enteric tube tip is in the left upper quadrant consistent with location in the body of the stomach. IMPRESSION: Enteric tube tip is in the left upper quadrant consistent with location in the body of the stomach. Electronically Signed   By: Lucienne Capers M.D.   On: 12/19/2017 00:08    Anti-infectives: Anti-infectives (From admission, onward)   Start     Dose/Rate Route Frequency Ordered Stop   12/17/17 1600  cefTAZidime (FORTAZ) 1 g in sodium chloride 0.9 % 100 mL IVPB  Status:  Discontinued     1 g 200 mL/hr over  30 Minutes Intravenous Every 8 hours 12/17/17 0800 12/17/17 0815   12/17/17 0900  cefTAZidime (FORTAZ) 1 g in sodium chloride 0.9 % 100 mL IVPB     1 g 200 mL/hr over 30 Minutes Intravenous Every 8 hours 12/17/17 0815 12/23/17 0959   12/16/17 1400  cefTAZidime (FORTAZ) 2 g in sodium chloride 0.9 % 100 mL IVPB  Status:  Discontinued     2 g 200 mL/hr over 30 Minutes Intravenous Every 8 hours 12/16/17 1125 12/17/17 0800   12/15/17 0300  vancomycin (VANCOCIN) IVPB 1000 mg/200 mL premix  Status:  Discontinued     1,000 mg 200 mL/hr over 60 Minutes Intravenous Every 8 hours 12/14/17 1844 12/15/17 1133   12/14/17 1900  vancomycin (VANCOCIN) 1,750 mg in  sodium chloride 0.9 % 500 mL IVPB     1,750 mg 250 mL/hr over 120 Minutes Intravenous  Once 12/14/17 1844 12/14/17 2343   12/14/17 1845  ceFEPIme (MAXIPIME) 2 g in sodium chloride 0.9 % 100 mL IVPB  Status:  Discontinued     2 g 200 mL/hr over 30 Minutes Intravenous Every 8 hours 12/14/17 1844 12/15/17 1133   12/14/17 1800  ceFEPIme (MAXIPIME) 2 g in sodium chloride 0.9 % 100 mL IVPB  Status:  Discontinued     2 g 200 mL/hr over 30 Minutes Intravenous  Once 12/14/17 1753 12/14/17 1844   12/14/17 1800  metroNIDAZOLE (FLAGYL) IVPB 500 mg  Status:  Discontinued     500 mg 100 mL/hr over 60 Minutes Intravenous Every 8 hours 12/14/17 1753 12/15/17 1133   12/14/17 1800  vancomycin (VANCOCIN) IVPB 1000 mg/200 mL premix  Status:  Discontinued     1,000 mg 200 mL/hr over 60 Minutes Intravenous  Once 12/14/17 1753 12/14/17 1844       Assessment/Plan 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 - POD 5 - BID dry dressing changes to midline wound due to serous drainage/trace surrounding erythema - no bowel function  ID - currently fortaz 11/19>> FEN - IVF, NPO/NGT to LIWS, TNA VTE - SCDs, sq heparin Foley - in place/per urology  Plan: Persistent ileus, Continue NG to suction. Continue TNA. Monitor midline wound closely as we may need to remove staples in a few days if drainage or erythema worsens. Await bowel function.   LOS: 6 days    Clovis Riley , [facility] 12/20/2017, 8:33 AM

## 2017-12-20 NOTE — Progress Notes (Signed)
ANTICOAGULATION CONSULT NOTE   Pharmacy Consult for heparin Indication: DVT   Patient Measurements: Height: 5' 5"  (165.1 cm) Weight: 222 lb 10.6 oz (101 kg) IBW/kg (Calculated) : 61.5 Heparin Dosing Weight: 84 kg  Vital Signs: Temp: 98.2 F (36.8 C) (11/23 0738) Temp Source: Axillary (11/23 0738) BP: 94/57 (11/23 1000) Pulse Rate: 70 (11/23 1000)  Labs: Recent Labs    12/18/17 0341 12/18/17 1300 12/19/17 0516 12/19/17 0944 12/20/17 0403  HGB 11.0*  --  10.3*  --  9.8*  HCT 34.6*  --  32.8*  --  31.6*  PLT 157  --  177  --  196  HEPARINUNFRC 0.45 0.38  --  0.37 0.36  CREATININE 0.63  --  0.71  --  0.59*  CKTOTAL 272  --   --   --   --     Estimated Creatinine Clearance: 136.9 mL/min (A) (by C-G formula based on SCr of 0.59 mg/dL (L)).   Medications:  Scheduled:  . chlorhexidine gluconate (MEDLINE KIT)  15 mL Mouth Rinse BID  . Chlorhexidine Gluconate Cloth  6 each Topical Daily  . diazepam  2 mg Intravenous BID  . mouth rinse  15 mL Mouth Rinse 10 times per day  . sodium chloride flush  10-40 mL Intracatheter Q12H    Assessment: 37 year old male with cerebral palsy. He was admitted with abdominal pain due to a SBO. He went for an ex lap which only showed a dilated small intestine without adhesions. He is currently on TPN while awaiting recovery of bowel function. 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 His INR and warfarin were reversed upon admission for surgery. Separately, he was found to have a urinary obstruction, he was a difficult cath placement and had some meatal bleeding. This is now resolved. He was placed on a heparin infusion 11/20, to be continued until warfarin can be safely resumed.   Heparin level is therapeutic. hgb 9.8, plts wnl.   Goal of Therapy:  Heparin level 0.3-0.7 units/ml Monitor platelets by anticoagulation protocol: Yes   Plan:  Continue current heparin infusion of 1450 units/hour. Daily HL, CBC  Nyshawn Gowdy, [facility] 12/20/2017,11:01 AM

## 2017-12-21 ENCOUNTER — Inpatient Hospital Stay (HOSPITAL_COMMUNITY): Payer: Medicare Other

## 2017-12-21 LAB — CBC
HCT: 32.2 % — ABNORMAL LOW (ref 39.0–52.0)
HEMOGLOBIN: 10.2 g/dL — AB (ref 13.0–17.0)
MCH: 28.7 pg (ref 26.0–34.0)
MCHC: 31.7 g/dL (ref 30.0–36.0)
MCV: 90.7 fL (ref 80.0–100.0)
NRBC: 0 % (ref 0.0–0.2)
PLATELETS: 217 10*3/uL (ref 150–400)
RBC: 3.55 MIL/uL — AB (ref 4.22–5.81)
RDW: 13.6 % (ref 11.5–15.5)
WBC: 4.7 10*3/uL (ref 4.0–10.5)

## 2017-12-21 LAB — GLUCOSE, CAPILLARY
GLUCOSE-CAPILLARY: 121 mg/dL — AB (ref 70–99)
GLUCOSE-CAPILLARY: 127 mg/dL — AB (ref 70–99)
Glucose-Capillary: 96 mg/dL (ref 70–99)
Glucose-Capillary: 82 mg/dL (ref 70–99)
Glucose-Capillary: 95 mg/dL (ref 70–99)
Glucose-Capillary: 98 mg/dL (ref 70–99)

## 2017-12-21 LAB — BASIC METABOLIC PANEL
Anion gap: 5 (ref 5–15)
BUN: 14 mg/dL (ref 6–20)
CHLORIDE: 101 mmol/L (ref 98–111)
CO2: 29 mmol/L (ref 22–32)
CREATININE: 0.65 mg/dL (ref 0.61–1.24)
Calcium: 7.7 mg/dL — ABNORMAL LOW (ref 8.9–10.3)
GFR calc non Af Amer: >60 mL/min (ref >60)
Glucose, Bld: 132 mg/dL — ABNORMAL HIGH (ref 70–99)
POTASSIUM: 3.3 mmol/L — AB (ref 3.5–5.1)
Sodium: 135 mmol/L (ref 135–145)

## 2017-12-21 LAB — PHOSPHORUS: PHOSPHORUS: 2.9 mg/dL (ref 2.5–4.6)

## 2017-12-21 LAB — MAGNESIUM: Magnesium: 1.9 mg/dL (ref 1.7–2.4)

## 2017-12-21 LAB — HEPARIN LEVEL (UNFRACTIONATED): Heparin Unfractionated: 0.33 IU/mL (ref 0.30–0.70)

## 2017-12-21 MED ORDER — HALOPERIDOL LACTATE 5 MG/ML IJ SOLN
5.0000 mg | Freq: Four times a day (QID) | INTRAMUSCULAR | Status: DC | PRN
  Administered 2017-12-26: 5 mg via INTRAVENOUS
  Filled 2017-12-21: qty 1

## 2017-12-21 MED ORDER — TIZANIDINE HCL 2 MG PO TABS
2.0000 mg | ORAL_TABLET | Freq: Three times a day (TID) | ORAL | Status: DC
  Administered 2017-12-21 – 2017-12-25 (×13): 2 mg
  Filled 2017-12-21 (×15): qty 1

## 2017-12-21 MED ORDER — CYCLOBENZAPRINE HCL 10 MG PO TABS
10.0000 mg | ORAL_TABLET | Freq: Every day | ORAL | Status: DC
  Administered 2017-12-21 – 2017-12-24 (×4): 10 mg
  Filled 2017-12-21 (×4): qty 1

## 2017-12-21 MED ORDER — TRACE MINERALS CR-CU-MN-SE-ZN 10-1000-500-60 MCG/ML IV SOLN
INTRAVENOUS | Status: AC
  Administered 2017-12-21: 18:00:00 via INTRAVENOUS
  Filled 2017-12-21: qty 840

## 2017-12-21 MED ORDER — CYCLOBENZAPRINE HCL 10 MG PO TABS
5.0000 mg | ORAL_TABLET | Freq: Two times a day (BID) | ORAL | Status: DC
  Administered 2017-12-22 – 2017-12-25 (×8): 5 mg
  Filled 2017-12-21 (×8): qty 1

## 2017-12-21 MED ORDER — BACLOFEN 20 MG PO TABS
20.0000 mg | ORAL_TABLET | Freq: Four times a day (QID) | ORAL | Status: DC
  Administered 2017-12-21 – 2017-12-25 (×17): 20 mg
  Filled 2017-12-21 (×20): qty 1

## 2017-12-21 MED ORDER — POTASSIUM CHLORIDE 10 MEQ/100ML IV SOLN
10.0000 meq | INTRAVENOUS | Status: AC
  Administered 2017-12-21 (×4): 10 meq via INTRAVENOUS
  Filled 2017-12-21 (×4): qty 100

## 2017-12-21 MED ORDER — MAGNESIUM SULFATE 2 GM/50ML IV SOLN
2.0000 g | Freq: Once | INTRAVENOUS | Status: AC
  Administered 2017-12-21: 2 g via INTRAVENOUS
  Filled 2017-12-21: qty 50

## 2017-12-21 NOTE — Progress Notes (Signed)
Patient ID: [name], male   DOB: [birth year], 37 y.o.   MRN: [id number]    6 Days Post-Op  Subjective: Patient sedated on vent.  RN reports no BMs.  Sounds like he had close to 600cc of output from NGT.  Objective: Vital signs in last 24 hours: Temp:  [97.9 F (36.6 C)-98.8 F (37.1 C)] 97.9 F (36.6 C) (11/24 0747) Pulse Rate:  [68-114] 82 (11/24 0730) Resp:  [12-22] 16 (11/24 0730) BP: (87-140)/(41-93) 102/70 (11/24 0730) SpO2:  [92 %-100 %] 94 % (11/24 0730) FiO2 (%):  [40 %] 40 % (11/24 0515) Last BM Date: 12/15/17  Intake/Output from previous day: 11/23 0701 - 11/24 0700 In: 2515.3 [I.V.:1814.1; IV Piggyback:701.2] Out: 6350 [Urine:5350; Emesis/NG output:1000] Intake/Output this shift: No intake/output data recorded.  PE: Abd: soft, midline wound is c/d/i with staples.  NGT with about 300cc currently, but output in tubing is very light green.  Few BS  Lab Results:  Recent Labs    12/20/17 0403 12/21/17 0437  WBC 5.4 4.7  HGB 9.8* 10.2*  HCT 31.6* 32.2*  PLT 196 217   BMET Recent Labs    12/20/17 0403 12/21/17 0437  NA 134* 135  K 3.9 3.3*  CL 105 101  CO2 25 29  GLUCOSE 137* 132*  BUN 15 14  CREATININE 0.59* 0.65  CALCIUM 7.7* 7.7*   PT/INR No results for input(s): LABPROT, INR in the last 72 hours. CMP     Component Value Date/Time   NA 135 12/21/2017 0437   K 3.3 (L) 12/21/2017 0437   CL 101 12/21/2017 0437   CO2 29 12/21/2017 0437   GLUCOSE 132 (H) 12/21/2017 0437   BUN 14 12/21/2017 0437   CREATININE 0.65 12/21/2017 0437   CREATININE 1.00 07/26/2015 1426   CALCIUM 7.7 (L) 12/21/2017 0437   PROT 4.5 (L) 12/18/2017 0341   ALBUMIN 2.1 (L) 12/18/2017 0341   AST 17 12/18/2017 0341   ALT 13 12/18/2017 0341   ALKPHOS 45 12/18/2017 0341   BILITOT 0.3 12/18/2017 0341   GFRNONAA >60 12/21/2017 0437   GFRNONAA >89 07/26/2015 1426   GFRAA >60 12/21/2017 0437   GFRAA >89 07/26/2015 1426   Lipase     Component Value Date/Time   LIPASE 23 12/14/2017 1727       Studies/Results: Dg Chest Portable 1 View  Result Date: 12/20/2017 CLINICAL DATA:  Patient on the ventilator. EXAM: PORTABLE CHEST 1 VIEW COMPARISON:  Chest radiograph 12/19/2017 FINDINGS: ET tube mid trachea. Monitoring leads overlie the patient. Right IJ central venous catheter tip projects over the superior vena cava. Cardiomegaly. Low lung volumes. Bibasilar opacities. Probable small bilateral pleural effusions. No pneumothorax. IMPRESSION: Support apparatus as above. Low lung volumes with basilar atelectasis and small effusions. Electronically Signed   By: Lovey Newcomer M.D.   On: 12/20/2017 08:36   Portable Chest X-ray  Result Date: 12/19/2017 CLINICAL DATA:  Post intubation EXAM: PORTABLE CHEST 1 VIEW COMPARISON:  12/16/2017 FINDINGS: Endotracheal tube is at the level of the carina. Very low lung volumes with bibasilar atelectasis. Possible layering effusions. Heart is borderline in size. NG tube is in the stomach. IMPRESSION: Endotracheal tube near the level of the carina. Very low lung volumes with bibasilar atelectasis and suspected layering effusions. Electronically Signed   By: Rolm Baptise M.D.   On: 12/19/2017 10:29    Anti-infectives: Anti-infectives (From admission, onward)   Start     Dose/Rate Route Frequency Ordered Stop   12/17/17 1600  cefTAZidime (  FORTAZ) 1 g in sodium chloride 0.9 % 100 mL IVPB  Status:  Discontinued     1 g 200 mL/hr over 30 Minutes Intravenous Every 8 hours 12/17/17 0800 12/17/17 0815   12/17/17 0900  cefTAZidime (FORTAZ) 1 g in sodium chloride 0.9 % 100 mL IVPB     1 g 200 mL/hr over 30 Minutes Intravenous Every 8 hours 12/17/17 0815 12/23/17 0959   12/16/17 1400  cefTAZidime (FORTAZ) 2 g in sodium chloride 0.9 % 100 mL IVPB  Status:  Discontinued     2 g 200 mL/hr over 30 Minutes Intravenous Every 8 hours 12/16/17 1125 12/17/17 0800   12/15/17 0300  vancomycin (VANCOCIN) IVPB 1000 mg/200 mL premix  Status:   Discontinued     1,000 mg 200 mL/hr over 60 Minutes Intravenous Every 8 hours 12/14/17 1844 12/15/17 1133   12/14/17 1900  vancomycin (VANCOCIN) 1,750 mg in sodium chloride 0.9 % 500 mL IVPB     1,750 mg 250 mL/hr over 120 Minutes Intravenous  Once 12/14/17 1844 12/14/17 2343   12/14/17 1845  ceFEPIme (MAXIPIME) 2 g in sodium chloride 0.9 % 100 mL IVPB  Status:  Discontinued     2 g 200 mL/hr over 30 Minutes Intravenous Every 8 hours 12/14/17 1844 12/15/17 1133   12/14/17 1800  ceFEPIme (MAXIPIME) 2 g in sodium chloride 0.9 % 100 mL IVPB  Status:  Discontinued     2 g 200 mL/hr over 30 Minutes Intravenous  Once 12/14/17 1753 12/14/17 1844   12/14/17 1800  metroNIDAZOLE (FLAGYL) IVPB 500 mg  Status:  Discontinued     500 mg 100 mL/hr over 60 Minutes Intravenous Every 8 hours 12/14/17 1753 12/15/17 1133   12/14/17 1800  vancomycin (VANCOCIN) IVPB 1000 mg/200 mL premix  Status:  Discontinued     1,000 mg 200 mL/hr over 60 Minutes Intravenous  Once 12/14/17 1753 12/14/17 1844       Assessment/Plan 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 - POD 6 - no dressing over abdominal incision needed as no further drainage noted. - no BM yet,  Few BS, but very thin NGT output.  Will try to clamp NGT today and check residuals.  If <200, then will leave clamped and if tolerates, then can likely try trickle TFs maybe tomorrow.  If residual >200, then will return to [facility].  Ok to clamp NGT for 30-60 minutes to give some of his home meds.  ID -currently fortaz 11/19>> FEN -IVF, NPO/NGT clamping trial, TNA VTE -SCDs, sq heparin Foley -in place/per urology    LOS: 7 days    Henreitta Cea , [facility] 12/21/2017, 7:55 AM Pager: [phone]

## 2017-12-21 NOTE — Progress Notes (Signed)
PHARMACY - ADULT TOTAL PARENTERAL NUTRITION CONSULT NOTE   Pharmacy Consult for TPN Indication: Nutrition s/p surgery for small bowel obstruction  Patient Measurements: Height: 5\' 5"  (165.1 cm) Weight: 222 lb 10.6 oz (101 kg) IBW/kg (Calculated) : 61.5  AdjBW = 68.8 kg   Body mass index is 37.05 kg/m. Usual Weight: 90.7 kg  Assessment:  Patient admitted 11/17 by ED p/w abdominal pain, N/V, lactic acidosis to 6, WBC 13, CT scan supportive of SBO. Pt required immediate surgery. 11/18 0208 s/p surgery for SBO; found diffusely dilated small intestine without transition point, small amount of adhesions, no ischemia. Patient extubated in OR, transferred to 53M,  intubated due to insufficient oxygenation at 0918. We are starting TPN because historically patient has had long post-op illeuses.  GI: s.p ExLap with LoA on 11/18; albumin 3.9 on admit. Pre-albumin 11.7. NG o/p increasing to 1000 mL/d, NGT to LIWS  Endo: No h/o DM, BG <180 Insulin requirements in the past 24 hours: 0 Lytes: K 3.3 (fluids w/K d/c + lasix + NG outpt), Mg 1.9, phos 2.9 s/p 54mmol + inc in TPN Renal: SCr 0.65, UOP 2.1 mL/kg/hr on lasix 40 IV q8 +13 L since admission  Pulm: Intubated, PRVC, FiO2 down to 40%, PEEP 5 Cards: MAP70s, no pressors, NSR, BP improving, HR 70s, CVP 19 Hepatobil: LFTs wnl, TG 96 (11/19) Neuro: Change from fent>versed @4 , Dex @ 1, baseline cerebal palsy, requires chronic antispasmodics. Now with concern for new seizures ID: WBC wnl, on ceftazidime for pseudomonas UTI  Heme: Pt on warfarin PTA for hx DVTs- currently on hold  - on IV Hep  TPN Access: PICC 11/18>> TPN start date: 11/18 Nutritional Goals (per RD assessment on 11/19): Kcal:  8563-1497 Protein:  124 gm Fluid:  1.5-2 L  Current Nutrition:  TPN @ 50 mL/hr   Plan:  Continue TPN at 50 mL/hr. Max conc TPN since 11/21.   Hold 20% lipid emulsion for first 7 days for ICU patients per ASPEN guidelines (Lipid start date 11/25) This  TPN provides 126 g of protein, 180 g of dextrose, and 0 g of lipids which provides 1116 kCals per day, meeting 100% of patient needs. Electrolytes in TPN: Cl:Ac 1:2, inc K (adjust with NG outpt/diuresis) Continue MVI, trace elements and famotidine to TPN  No insulin requirements on goal TPN KCl 10mg  IV x 4 today TPN labs Monday  Bertis Ruddy, PharmD Clinical Pharmacist Please check AMION for all [place] numbers 12/21/2017 7:07 AM

## 2017-12-21 NOTE — Progress Notes (Signed)
NAME:  Kalup Jaquith, MRN:  [id number], DOB:  [birth year], LOS: 7 ADMISSION DATE:  12/14/2017, CONSULTATION DATE:  12/15/2017 REFERRING MD:  N/A, CHIEF COMPLAINT:  Abd pain with N/V  Brief History   Mr. Myer Bohlman is a 37y/o male with PMH of Cerebral palsy, DVT on coumadin, HTN, and chronic constipation. He has also had a history of skin ulceration and breakdown.  Past Medical History Cerebral palsy, SBO s/p exploratory lap, DVT on coumadin, HTN, Neurogenic bladder  Significant Hospital Events   11/18 - Exploratory Laparotomy with lysis of adhesions 11/18 - Desaturation into 50s and initially unresponsive, now intubated and sedated 11/18 - Central Line placed, TPN started 11/19 - Foley placed requiring Urology 11/19 - UCx grew >100k Pseudomonas Aerg. 11/19 - Bil. Duplex U/S of LE neg for DVTs 11/20 - EEG: slowly most likely due to sedating medications; no signs of seizure activity  Consults:  Sugery  Procedures:  Exploratory Lapartomy Bilateral doppler LE U/S EGG  Significant Diagnostic Tests:  CT Abd: Dilated small bowel with air-fluid levels to the distal small bowel compatible with distal small bowel obstruction. EEG: slowing most likely due to sedating medications; no signs of seizure acitivity  Micro Data:  BCx: 1/2 positive for Staph Aureus; likely contaminate cont to monitor Urine Cx: >100k Pseudomonas Aeruginosa MRSA screen: neg  Antimicrobials:  Tressie Ellis 11/20 >>  Interim history/subjective:  Increased agitation overnight , required haldol  Not weaning this am.  Good UOP with neg 3.4 L x 24 hr , -kidney fxn stable    Objective   Blood pressure 116/75, pulse 89, temperature 97.9 F (36.6 C), temperature source Axillary, resp. rate 13, height 5\' 5"  (1.651 m), weight 101 kg, SpO2 100 %.    Vent Mode: PRVC FiO2 (%):  [40 %] 40 % Set Rate:  [14 bmp] 14 bmp Vt Set:  [490 mL] 490 mL PEEP:  [5 cmH20] 5 cmH20 Pressure Support:  [20 cmH20] 20  cmH20 Plateau Pressure:  [18 cmH20-22 cmH20] 20 cmH20   Intake/Output Summary (Last 24 hours) at 12/21/2017 1012 Last data filed at 12/21/2017 1000 Gross per 24 hour  Intake 2604.73 ml  Output 6700 ml  Net -4095.27 ml   Filed Weights   12/14/17 1823 12/17/17 0500  Weight: 90.7 kg 101 kg    Examination: General: mild agitation, moves to voice and touch  HENT:  Dry mucosa , ETT  Lungs: Breath sounds in the bases   Cardiovascular: RRR, no MRG  Abdomen: Soft obese , mid line abdominal incision staples intact no redness absent bowel sounds, NGT in place Extremities: Positive edema  Skin: Intact without rash  Neuro: Alert, opens eyes, responds to voice  GU: Foley in place   Labs   CBC: Recent Labs  Lab 12/14/17 1727  12/16/17 0303 12/17/17 1044 12/18/17 0341 12/19/17 0516 12/20/17 0403 12/21/17 0437  WBC 13.3*   < > 4.9 4.8 6.1 6.3 5.4 4.7  NEUTROABS 11.9*  --  3.0  --   --   --   --   --   HGB 15.5   < > 10.4* 10.4* 11.0* 10.3* 9.8* 10.2*  HCT 48.0   < > 33.1* 32.5* 34.6* 32.8* 31.6* 32.2*  MCV 90.4   < > 93.0 91.5 93.0 92.4 92.4 90.7  PLT 241   < > 146* 141* 157 177 196 217   < > = values in this interval not displayed.    Basic Metabolic Panel: Recent Labs  Lab 12/16/17 0303  12/17/17 0541 12/18/17 0341 12/19/17 0516 12/20/17 0403 12/21/17 0437  NA 136 135 135 135 134* 135  K 3.4* 4.0 3.9 4.2 3.9 3.3*  CL 106 108 108 107 105 101  CO2 26 24 24 25 25 29   GLUCOSE 151* 144* 96 122* 137* 132*  BUN 12 11 12 13 15 14   CREATININE 0.94 0.66 0.63 0.71 0.59* 0.65  CALCIUM 7.1* 6.9* 7.0* 7.3* 7.7* 7.7*  MG 1.9 1.8 2.3  --  1.9 1.9  PHOS 1.3* 2.2* 2.7 2.2* 2.3* 2.9   GFR: Estimated Creatinine Clearance: 136.9 mL/min (by C-G formula based on SCr of 0.65 mg/dL). Recent Labs  Lab 12/14/17 1939  12/14/17 2240  12/15/17 0444  12/16/17 1115 12/16/17 1527  12/18/17 0341 12/19/17 0516 12/20/17 0403 12/21/17 0437  PROCALCITON 0.77  --   --   --   --   --   --   --    --   --   --   --   --   WBC  --   --   --    < >  --    < >  --   --    < > 6.1 6.3 5.4 4.7  LATICACIDVEN  --    < > 3.01*  --  1.9  --  0.8 0.7  --   --   --   --   --    < > = values in this interval not displayed.    Liver Function Tests: Recent Labs  Lab 12/14/17 1727 12/16/17 0303 12/18/17 0341  AST 35 17 17  ALT 28 15 13   ALKPHOS 65 39 45  BILITOT 1.4* 1.1 0.3  PROT 7.3 4.4* 4.5*  ALBUMIN 3.9 2.3* 2.1*   Recent Labs  Lab 12/14/17 1727  LIPASE 23   No results for input(s): AMMONIA in the last 168 hours.  ABG    Component Value Date/Time   PHART 7.490 (H) 12/15/2017 1048   PCO2ART 31.7 (L) 12/15/2017 1048   PO2ART 204.0 (H) 12/15/2017 1048   HCO3 24.2 12/15/2017 1048   TCO2 25 12/15/2017 1048   O2SAT 100.0 12/15/2017 1048     Coagulation Profile: Recent Labs  Lab 12/14/17 1727 12/15/17 0440  INR 2.35 1.33    Cardiac Enzymes: Recent Labs  Lab 12/16/17 1115 12/18/17 0341  CKTOTAL 135 272    HbA1C: No results found for: HGBA1C  CBG: Recent Labs  Lab 12/20/17 1525 12/20/17 1936 12/21/17 0004 12/21/17 0357 12/21/17 0745  GLUCAP 102* 98 121* 127* 96    Resolved Hospital Problem list   Lactic Acidosis  Assessment & Plan:  Acute Hypoxic Respiratory Failure - Stable on vent. Still unclear etiology but likely multifactorial due to narcotics, aspiration, and post surgical respiratory depression Possible component of volume overload  -Assess for daily weaning .  - Daily monitoring for SBT -Tatian intermittent agitation contributing factor - Family is not wanting trach tube placed -continue on Lasix   Small Bowel Obstruction s/p Exploratory Laparotomy - General Surgery following; continue NG tube; do not want to give oral trial until he shows bowel function - Started TNA on 11/18; dosed per pharm - Cont airbed due to complications from skin ulcers and tissue breakdown from prior admissions  UTI - Stable. Patient had clean U/A but grew  >100k Pseudomonas colonies on UCx. - Ceftazidime 2g q8hrs started on 11/19 - cont to monitor clinically   Cerebral Palsy with chronic spasticity - Holding home meds:  baclofen, flexeril, oral valium, zanaflex - Will resume home meds and titrate up slowly once he can receive PO - Robaxin IV q8hrs - Vallium 2mg  q12 IV - Ativan 1mg  IV prn  DVT on chronic coumadin  - Cont Heparin today to transition to warfarin; pharmacy to dose - Follow INR  HTN - Holding home meds metoprolol and HCTZ - Stable for now, IV Lopressor for  SBP >170  Paroxymsal Atrial Fibrilation. - Stable. Holding metoprolol for now, continue to monitor heart rate, IV Lopressor for HR > 120 - Restart home meds once he is taking oral  Hypokalemia- -K replaced   Best practice:  Diet: NPO, NG tube in place Pain/Anxiety/Delirium protocol (if indicated): Ativan prn VAP protocol (if indicated): Initiated on 11/18 DVT prophylaxis: SDCs for now -> heparin to warfarin bridge GI prophylaxis: Pepcid 20mg  BID Mobility: bed/wheelchair bound Code Status: full Family Communication: Sister is HCPOA Disposition: ICU  Critical care time: > 30 min   Tammy Parrett NP-C  [facility]  [phone]   12/21/2017

## 2017-12-21 NOTE — Progress Notes (Signed)
PCCM INTERVAL PROGRESS NOTE  Patient agitated despite two sedative infusions. He is biting tube very hard and staff is concerned he may damage the ETT. QTC 430. Will order PRN haldol.   Georgann Housekeeper, AGACNP-BC [place] Pager [phone] or [phone]  12/21/2017 12:10 AM

## 2017-12-21 NOTE — Progress Notes (Signed)
ANTICOAGULATION CONSULT NOTE   Pharmacy Consult for heparin Indication: DVT   Patient Measurements: Height: 5' 5"  (165.1 cm) Weight: 222 lb 10.6 oz (101 kg) IBW/kg (Calculated) : 61.5 Heparin Dosing Weight: 84 kg  Vital Signs: Temp: 99 F (37.2 C) (11/24 1119) Temp Source: Axillary (11/24 1119) BP: 114/92 (11/24 1300) Pulse Rate: 94 (11/24 1300)  Labs: Recent Labs    12/19/17 0516 12/19/17 0944 12/20/17 0403 12/21/17 0437  HGB 10.3*  --  9.8* 10.2*  HCT 32.8*  --  31.6* 32.2*  PLT 177  --  196 217  HEPARINUNFRC  --  0.37 0.36 0.33  CREATININE 0.71  --  0.59* 0.65    Estimated Creatinine Clearance: 136.9 mL/min (by C-G formula based on SCr of 0.65 mg/dL).   Medications:  Scheduled:  . chlorhexidine gluconate (MEDLINE KIT)  15 mL Mouth Rinse BID  . Chlorhexidine Gluconate Cloth  6 each Topical Daily  . diazepam  2 mg Intravenous BID  . furosemide  40 mg Intravenous Q8H  . mouth rinse  15 mL Mouth Rinse 10 times per day  . sodium chloride flush  10-40 mL Intracatheter Q12H    Assessment: 37 year old male with cerebral palsy. He was admitted with abdominal pain due to a SBO. He went for an ex lap which only showed a dilated small intestine without adhesions. He is currently on TPN while awaiting recovery of bowel function. 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 His INR and warfarin were reversed upon admission for surgery. Separately, he was found to have a urinary obstruction, he was a difficult cath placement and had some meatal bleeding. This is now resolved. He was placed on a heparin infusion 11/20, to be continued until warfarin can be safely resumed.   Heparin level is therapeutic. hgb 10.2, plts wnl.   Goal of Therapy:  Heparin level 0.3-0.7 units/ml Monitor platelets by anticoagulation protocol: Yes   Plan:  Continue current heparin infusion of 1450 units/hour. Daily HL, CBC   Kattleya Kuhnert, Jake Church 12/21/2017,1:12 PM

## 2017-12-22 ENCOUNTER — Inpatient Hospital Stay (HOSPITAL_COMMUNITY): Payer: Medicare Other

## 2017-12-22 LAB — CBC
HEMATOCRIT: 33.2 % — AB (ref 39.0–52.0)
Hemoglobin: 10.4 g/dL — ABNORMAL LOW (ref 13.0–17.0)
MCH: 28.3 pg (ref 26.0–34.0)
MCHC: 31.3 g/dL (ref 30.0–36.0)
MCV: 90.2 fL (ref 80.0–100.0)
NRBC: 0 % (ref 0.0–0.2)
Platelets: 270 10*3/uL (ref 150–400)
RBC: 3.68 MIL/uL — AB (ref 4.22–5.81)
RDW: 13.7 % (ref 11.5–15.5)
WBC: 5.9 10*3/uL (ref 4.0–10.5)

## 2017-12-22 LAB — GLUCOSE, CAPILLARY
GLUCOSE-CAPILLARY: 109 mg/dL — AB (ref 70–99)
GLUCOSE-CAPILLARY: 116 mg/dL — AB (ref 70–99)
Glucose-Capillary: 100 mg/dL — ABNORMAL HIGH (ref 70–99)
Glucose-Capillary: 104 mg/dL — ABNORMAL HIGH (ref 70–99)
Glucose-Capillary: 121 mg/dL — ABNORMAL HIGH (ref 70–99)
Glucose-Capillary: 125 mg/dL — ABNORMAL HIGH (ref 70–99)
Glucose-Capillary: 129 mg/dL — ABNORMAL HIGH (ref 70–99)

## 2017-12-22 LAB — PHOSPHORUS: Phosphorus: 3.1 mg/dL (ref 2.5–4.6)

## 2017-12-22 LAB — COMPREHENSIVE METABOLIC PANEL
ALT: 23 U/L (ref 0–44)
AST: 27 U/L (ref 15–41)
Albumin: 2.2 g/dL — ABNORMAL LOW (ref 3.5–5.0)
Alkaline Phosphatase: 53 U/L (ref 38–126)
Anion gap: 5 (ref 5–15)
BILIRUBIN TOTAL: 0.4 mg/dL (ref 0.3–1.2)
BUN: 15 mg/dL (ref 6–20)
CALCIUM: 8 mg/dL — AB (ref 8.9–10.3)
CO2: 30 mmol/L (ref 22–32)
CREATININE: 0.67 mg/dL (ref 0.61–1.24)
Chloride: 100 mmol/L (ref 98–111)
GFR calc Af Amer: >60 mL/min (ref >60)
Glucose, Bld: 142 mg/dL — ABNORMAL HIGH (ref 70–99)
POTASSIUM: 3.9 mmol/L (ref 3.5–5.1)
Sodium: 135 mmol/L (ref 135–145)
TOTAL PROTEIN: 5.1 g/dL — AB (ref 6.5–8.1)

## 2017-12-22 LAB — DIFFERENTIAL
Abs Immature Granulocytes: 0.09 10*3/uL — ABNORMAL HIGH (ref 0.00–0.07)
BASOS ABS: 0 10*3/uL (ref 0.0–0.1)
Basophils Relative: 0 %
EOS PCT: 3 %
Eosinophils Absolute: 0.2 10*3/uL (ref 0.0–0.5)
Immature Granulocytes: 2 %
LYMPHS ABS: 0.9 10*3/uL (ref 0.7–4.0)
LYMPHS PCT: 16 %
MONO ABS: 0.8 10*3/uL (ref 0.1–1.0)
MONOS PCT: 13 %
NEUTROS ABS: 3.9 10*3/uL (ref 1.7–7.7)
Neutrophils Relative %: 66 %

## 2017-12-22 LAB — HEPARIN LEVEL (UNFRACTIONATED): Heparin Unfractionated: 0.31 IU/mL (ref 0.30–0.70)

## 2017-12-22 LAB — PREALBUMIN: Prealbumin: 10.6 mg/dL — ABNORMAL LOW (ref 18–38)

## 2017-12-22 LAB — TRIGLYCERIDES: Triglycerides: 140 mg/dL (ref <150)

## 2017-12-22 LAB — MAGNESIUM: MAGNESIUM: 2.1 mg/dL (ref 1.7–2.4)

## 2017-12-22 MED ORDER — ACETAMINOPHEN 325 MG PO TABS
650.0000 mg | ORAL_TABLET | Freq: Four times a day (QID) | ORAL | Status: DC | PRN
  Administered 2017-12-22 – 2017-12-23 (×2): 650 mg via NASOGASTRIC
  Filled 2017-12-22 (×2): qty 2

## 2017-12-22 MED ORDER — TRACE MINERALS CR-CU-MN-SE-ZN 10-1000-500-60 MCG/ML IV SOLN
INTRAVENOUS | Status: AC
  Administered 2017-12-22: 17:00:00 via INTRAVENOUS
  Filled 2017-12-22: qty 824

## 2017-12-22 MED ORDER — METOCLOPRAMIDE HCL 5 MG/ML IJ SOLN
10.0000 mg | Freq: Three times a day (TID) | INTRAMUSCULAR | Status: DC
  Administered 2017-12-22 – 2017-12-24 (×6): 10 mg via INTRAVENOUS
  Filled 2017-12-22 (×7): qty 2

## 2017-12-22 MED ORDER — ETOMIDATE 2 MG/ML IV SOLN
20.0000 mg | Freq: Once | INTRAVENOUS | Status: AC
  Administered 2017-12-22: 20 mg via INTRAVENOUS

## 2017-12-22 MED ORDER — FENTANYL CITRATE (PF) 100 MCG/2ML IJ SOLN
INTRAMUSCULAR | Status: AC
  Filled 2017-12-22: qty 2

## 2017-12-22 MED ORDER — POTASSIUM CHLORIDE 10 MEQ/100ML IV SOLN
10.0000 meq | INTRAVENOUS | Status: AC
  Administered 2017-12-22 (×2): 10 meq via INTRAVENOUS
  Filled 2017-12-22 (×2): qty 100

## 2017-12-22 NOTE — Procedures (Signed)
Intubation Procedure Note Ahmod Gillespie [id number] 01/16/1981  Procedure: Intubation Indications: ETT needed to be changed  Procedure Details Consent: Unable to obtain consent because of emergent medical necessity. Time Out: Verified patient identification, verified procedure, site/side was marked, verified correct patient position, special equipment/implants available, medications/allergies/relevent history reviewed, required imaging and test results available.  Performed  Maximum sterile technique was used including gloves, hand hygiene and mask.  MAC    Evaluation Hemodynamic Status: BP stable throughout; O2 sats: stable throughout Patient's Current Condition: stable Complications: No apparent complications Patient did tolerate procedure well. Chest X-ray ordered to verify placement.  CXR: pending.   Jennet Maduro 12/22/2017

## 2017-12-22 NOTE — Progress Notes (Signed)
Helping unit RT, called to bedside d/t pt vent alarming low minute volume, pt appears agitated and biting on ETT.  RN gave sedation, and pt was placed pt back on full vent support. VSS, no distress noted currently.

## 2017-12-22 NOTE — Progress Notes (Signed)
Nutrition Follow-up  DOCUMENTATION CODES:   Obesity unspecified  INTERVENTION:    Continue TPN at 50 ml/h to meet nutrition needs.    When able to begin TF, recommend Vital High Protein at 20 ml/h. When tolerance established, increase by 10 ml every 4-8 hours to goal rate of 40 ml/h with Pro-stat 30 ml TID to provide 1260 kcal, 129 gm protein, 803 ml free water daily.  NUTRITION DIAGNOSIS:   Inadequate oral intake related to inability to eat as evidenced by NPO status.  Ongoing  GOAL:   Provide needs based on ASPEN/SCCM guidelines  Met with TPN  MONITOR:   Vent status, Labs, I & O's  ASSESSMENT:   37 yo male with PMH of cerebral palsy, recurrent UTIs, tachycardia, chronic constipation, and HTN who was admitted on 11/17 with SBO. S/P ex lap with lysis of adhesions 11/18.   Patient was extubated 11/22, but quickly required reintubation.   Receiving TPN at 50 ml/h providing 1099 kcal and 124 gm protein per day. Meeting 100% of kcal needs and 100% of protein needs. Lipids were resumed 11/25.   NGT clamping trials ongoing. May be able to begin TF within the next 24-48 hours. Reglan has been ordered for a 5 day course.    Patient is currently intubated on ventilator support Temp (24hrs), Avg:99.4 F (37.4 C), Min:98.7 F (37.1 C), Max:100.3 F (37.9 C)   Labs and medications reviewed.  Weight up with positive volume status. I/O +8.8 L since admission. Receiving Lasix.    Diet Order:   Diet Order            Diet NPO time specified  Diet effective now              EDUCATION NEEDS:   No education needs have been identified at this time  Skin:  Skin Assessment: (MASD to buttocks & thigh)  Last BM:  11/18  Height:   Ht Readings from Last 1 Encounters:  12/15/17 5' 5"  (1.651 m)    Weight:   Wt Readings from Last 1 Encounters:  12/17/17 101 kg    Ideal Body Weight:  61.8 kg  BMI:  Body mass index is 37.05 kg/m.  Estimated Nutritional Needs:    Kcal:  0459-9774  Protein:  124 gm  Fluid:  1.5-2 L    Molli Barrows, RD, LDN, [place] Pager [phone] After Hours Pager [phone]

## 2017-12-22 NOTE — Progress Notes (Signed)
PHARMACY - ADULT TOTAL PARENTERAL NUTRITION CONSULT NOTE   Pharmacy Consult for TPN Indication: Nutrition s/p surgery for small bowel obstruction  Patient Measurements: Height: 5\' 5"  (165.1 cm) Weight: 222 lb 10.6 oz (101 kg) IBW/kg (Calculated) : 61.5  AdjBW = 68.8 kg   Body mass index is 37.05 kg/m. Usual Weight: 90.7 kg  Assessment:  Patient admitted 11/17 by ED p/w abdominal pain, N/V, lactic acidosis to 6, WBC 13, CT scan supportive of SBO. Pt required immediate surgery. 11/18 0208 s/p surgery for SBO; found diffusely dilated small intestine without transition point, small amount of adhesions, no ischemia. Patient extubated in OR, transferred to 54M,  intubated due to insufficient oxygenation at 0918. TPN started because historically patient has had long post-op ileus.  GI: s.p ExLap with LoA on 11/18; Pre-albumin 11.7>10.6. No BM yet, few BS. NG o/p 410 cc/24h, NGT clamping trials continued 11/25 - may start TF in next day or two if tolerates. Start reglan x 5 days 11/25 Endo: No h/o DM, BG <180. No SSI Insulin requirements in the past 24 hours: 0 Lytes: K up to 3.9 (S/p lasix + K runs x 4), Mg up to 2.1 (s/p Mag sulf 2g IV x1), others WNL Renal: SCr 0.67 stable, UOP 2.6 mL/kg/hr on lasix 40 IV q8h. +9.1 L since admission  Pulm: Intubated, PRVC, FiO2 40%, PEEP 5 Cards: MAP 70-90s, NSR Hepatobil: LFTs wnl, TG 96>140 Neuro: Change from fent>versed @4 , Dex @0 .8, baseline cerebal palsy, requires chronic antispasmodics. Now with concern for new seizures - none seen on EEG. Oral meds for spasms resumed in PO trial ID: Afeb, WBC wnl, on ceftazidime for pseudomonas UTI to stop 11/25 Heme: Pt on warfarin PTA for hx DVTs- currently on hold  - on IV Hep  TPN Access: PICC 11/18>> TPN start date: 11/18 Nutritional Goals (per RD assessment on 11/19): Kcal:  1000-1275 Protein:  124 gm Fluid:  1.5-2 L  Current Nutrition:  TPN @ 50 mL/hr NPO  Plan:  Continue TPN at 50 mL/hr. Max conc  TPN since 11/21. Lipids started 11/25 after holding for first 7 days for ICU patients per ASPEN guidelines This TPN provides 124 g of protein, 72 g of dextrose, and 36 g of lipids which provides 1099 kCals per day, meeting 100% of patient needs. Electrolytes in TPN: Cl:Ac 1:2, Continue 11/24 K increase (adjust with NG outpt/diuresis) Continue MVI, trace elements, and famotidine 40mg  added to TPN  No insulin requirements on goal TPN - monitor CBGs MD ordered K runs x 2 Monitor TPN labs, toleration of NGT clamping trial and ability to start TF  Elicia Lamp, PharmD, BCPS Clinical Pharmacist Clinical phone [phone] Please check AMION for all [place] contact numbers 12/22/2017 7:52 AM

## 2017-12-22 NOTE — Progress Notes (Signed)
ANTICOAGULATION CONSULT NOTE - Follow Up Consult  Pharmacy Consult for heparin Indication: DVT  Allergies  Allergen Reactions  . Betadine [Povidone Iodine] Other (See Comments)    Unknown reaction per caregiver  . Ciprofloxacin Other (See Comments)    Unknown reaction per caregiver  . Morphine Other (See Comments)    Unknown reaction per caregiver    Patient Measurements: Height: 5' 5"  (165.1 cm) Weight: 222 lb 10.6 oz (101 kg) IBW/kg (Calculated) : 61.5 Heparin Dosing Weight: 84 kg  Vital Signs: Temp: 98.7 F (37.1 C) (11/25 0721) Temp Source: Axillary (11/25 0721) BP: 90/70 (11/25 0800) Pulse Rate: 89 (11/25 0800)  Labs: Recent Labs    12/20/17 0403 12/21/17 0437 12/22/17 0506  HGB 9.8* 10.2* 10.4*  HCT 31.6* 32.2* 33.2*  PLT 196 217 270  HEPARINUNFRC 0.36 0.33 0.31  CREATININE 0.59* 0.65 0.67    Estimated Creatinine Clearance: 136.9 mL/min (by C-G formula based on SCr of 0.67 mg/dL).   Medications:  Scheduled:  . baclofen  20 mg Per Tube QID  . chlorhexidine gluconate (MEDLINE KIT)  15 mL Mouth Rinse BID  . Chlorhexidine Gluconate Cloth  6 each Topical Daily  . cyclobenzaprine  5 mg Per Tube BID   And  . cyclobenzaprine  10 mg Per Tube QHS  . diazepam  2 mg Intravenous BID  . furosemide  40 mg Intravenous Q8H  . mouth rinse  15 mL Mouth Rinse 10 times per day  . sodium chloride flush  10-40 mL Intracatheter Q12H  . tiZANidine  2 mg Per Tube TID    Assessment: 37 year old male with cerebral palsy. He was admitted with abdominal pain due to a SBO. He went for an ex lap which only showed a dilated small intestine without adhesions. He is currently on TPN while awaiting recovery of bowel function. 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 His INR and warfarin were reversed upon admission for surgery. Separately, he was found to have a urinary obstruction, he was a difficult cath placement and had some meatal bleeding. This is now resolved. He was placed  on a heparin infusion 11/20, to be continued until warfarin can be safely resumed.  Heparin level is therapeutic. hgb 10.4, plts wnl.  Goal of Therapy:  Heparin level 0.3-0.7 units/ml Monitor platelets by anticoagulation protocol: Yes   Plan:  Continue current heparin infusion of 1450 units/hour. Continue to monitor H&H and platelets, CBC, and HL daily.  Evalyn Casco 12/22/2017,8:21 AM

## 2017-12-22 NOTE — Progress Notes (Signed)
Pt's ETT changed due to cuff leaking.

## 2017-12-22 NOTE — Progress Notes (Signed)
[facility] Progress Note  7 Days Post-Op  Subjective: CC:  Sedated on the vent, in soft restraints.  Objective: Vital signs in last 24 hours: Temp:  [98.7 F (37.1 C)-100.3 F (37.9 C)] 98.7 F (37.1 C) (11/25 0721) Pulse Rate:  [72-99] 80 (11/25 0830) Resp:  [14-28] 20 (11/25 0830) BP: (90-127)/(47-92) 106/76 (11/25 0830) SpO2:  [95 %-100 %] 99 % (11/25 0830) FiO2 (%):  [40 %] 40 % (11/25 0804) Last BM Date: 12/15/17  Intake/Output from previous day: 11/24 0701 - 11/25 0700 In: 2780.1 [I.V.:1612.7; NG/GT:375; IV Piggyback:792.4] Out: 6435 [Urine:6025; Emesis/NG output:410] Intake/Output this shift: Total I/O In: 305.3 [I.V.:155.8; NG/GT:140; IV Piggyback:9.4] Out: 475 [Urine:475]  PE: Gen:  Sedated  Abd: Soft, mild distention, midline staples clean and dry, +BS all 4 quadrants, NG tube clamped  Skin: warm and dry, no rashes  Psych: A&Ox3    Lab Results:  Recent Labs    12/21/17 0437 12/22/17 0506  WBC 4.7 5.9  HGB 10.2* 10.4*  HCT 32.2* 33.2*  PLT 217 270   BMET Recent Labs    12/21/17 0437 12/22/17 0506  NA 135 135  K 3.3* 3.9  CL 101 100  CO2 29 30  GLUCOSE 132* 142*  BUN 14 15  CREATININE 0.65 0.67  CALCIUM 7.7* 8.0*   PT/INR No results for input(s): LABPROT, INR in the last 72 hours. CMP     Component Value Date/Time   NA 135 12/22/2017 0506   K 3.9 12/22/2017 0506   CL 100 12/22/2017 0506   CO2 30 12/22/2017 0506   GLUCOSE 142 (H) 12/22/2017 0506   BUN 15 12/22/2017 0506   CREATININE 0.67 12/22/2017 0506   CREATININE 1.00 07/26/2015 1426   CALCIUM 8.0 (L) 12/22/2017 0506   PROT 5.1 (L) 12/22/2017 0506   ALBUMIN 2.2 (L) 12/22/2017 0506   AST 27 12/22/2017 0506   ALT 23 12/22/2017 0506   ALKPHOS 53 12/22/2017 0506   BILITOT 0.4 12/22/2017 0506   GFRNONAA >60 12/22/2017 0506   GFRNONAA >89 07/26/2015 1426   GFRAA >60 12/22/2017 0506   GFRAA >89 07/26/2015 1426   Lipase     Component Value Date/Time   LIPASE 23  12/14/2017 1727       Studies/Results: Dg Chest Portable 1 View  Result Date: 12/22/2017 CLINICAL DATA:  Hypoxia EXAM: PORTABLE CHEST 1 VIEW COMPARISON:  December 21, 2017 FINDINGS: Endotracheal tube tip is 4.3 cm above the carina. Nasogastric tube tip and side port are below the diaphragm. No pneumothorax. There is airspace consolidation throughout the left lower lobe. There are left pleural effusions. Heart is upper normal in size with pulmonary vascularity normal. No adenopathy. No bone lesions. IMPRESSION: Tube positions as described without pneumothorax. Left lower lobe consolidation. There are fairly small pleural effusions bilaterally. Stable cardiac silhouette. Electronically Signed   By: Lowella Grip III M.D.   On: 12/22/2017 07:20   Dg Chest Portable 1 View  Result Date: 12/21/2017 CLINICAL DATA:  Respiratory failure EXAM: PORTABLE CHEST 1 VIEW COMPARISON:  12/20/2017 and prior exams FINDINGS: An endotracheal tube, NG tube and RIGHT IJ central venous catheter are unchanged. This is a very low volume film. Continued RIGHT basilar atelectasis and LEFT LOWER lung consolidation/atelectasis noted. There is no evidence of pneumothorax. Little significant change noted from the prior study. IMPRESSION: Unchanged appearance of the chest with RIGHT basilar atelectasis and LEFT LOWER lung consolidation/atelectasis. Electronically Signed   By: Margarette Canada M.D.   On: 12/21/2017 07:53  Vas [place] Lower Extremity Venous (dvt)  Result Date: 12/21/2017  Lower Venous Study Indications: Edema, and Swelling. Other Indications: Known Left AVM. Limitations: Body habitus, poor ultrasound/tissue interface and patient positioning. Performing Technologist: Abram Sander RVS  Examination Guidelines: A complete evaluation includes B-mode imaging, spectral Doppler, color Doppler, and power Doppler as needed of all accessible portions of each vessel. Bilateral testing is considered an integral part of a complete  examination. Limited examinations for reoccurring indications may be performed as noted.  Right Venous Findings: +---------+---------------+---------+-----------+----------+--------------+          CompressibilityPhasicitySpontaneityPropertiesSummary        +---------+---------------+---------+-----------+----------+--------------+ CFV      Full           Yes      Yes                                 +---------+---------------+---------+-----------+----------+--------------+ SFJ      Full                                                        +---------+---------------+---------+-----------+----------+--------------+ FV Prox  Full                                                        +---------+---------------+---------+-----------+----------+--------------+ FV Mid   Full                                                        +---------+---------------+---------+-----------+----------+--------------+ FV DistalFull                                                        +---------+---------------+---------+-----------+----------+--------------+ PFV      Full                                                        +---------+---------------+---------+-----------+----------+--------------+ POP      Full           Yes      Yes                                 +---------+---------------+---------+-----------+----------+--------------+ PTV                                                   not visualized +---------+---------------+---------+-----------+----------+--------------+ PERO  not visualized +---------+---------------+---------+-----------+----------+--------------+  Left Venous Findings: +---------+---------------+---------+-----------+----------+--------------+          CompressibilityPhasicitySpontaneityPropertiesSummary         +---------+---------------+---------+-----------+----------+--------------+ CFV      Full           Yes      Yes                  AVM            +---------+---------------+---------+-----------+----------+--------------+ SFJ      Full                                                        +---------+---------------+---------+-----------+----------+--------------+ FV Prox  Full                                                        +---------+---------------+---------+-----------+----------+--------------+ FV Mid                                                not visualized +---------+---------------+---------+-----------+----------+--------------+ FV Distal                                             not visualized +---------+---------------+---------+-----------+----------+--------------+ POP      Full           Yes      Yes                  limited        +---------+---------------+---------+-----------+----------+--------------+ PTV                                                   not visualized +---------+---------------+---------+-----------+----------+--------------+ PERO                                                  not visualized +---------+---------------+---------+-----------+----------+--------------+    Summary: Right: There is no evidence of deep vein thrombosis in the lower extremity. However, portions of this examination were limited- see technologist comments above. No cystic structure found in the popliteal fossa. Left: There is no evidence of deep vein thrombosis in the lower extremity. However, portions of this examination were limited- see technologist comments above. No cystic structure found in the popliteal fossa.  *See table(s) above for measurements and observations. Electronically signed by Harold Barban MD on 12/21/2017 at 5:31:59 PM.    Final     Anti-infectives: Anti-infectives (From admission, onward)   Start     Dose/Rate  Route Frequency Ordered Stop   12/17/17 1600  cefTAZidime (FORTAZ) 1 g in sodium chloride 0.9 % 100 mL IVPB  Status:  Discontinued  1 g 200 mL/hr over 30 Minutes Intravenous Every 8 hours 12/17/17 0800 12/17/17 0815   12/17/17 0900  cefTAZidime (FORTAZ) 1 g in sodium chloride 0.9 % 100 mL IVPB     1 g 200 mL/hr over 30 Minutes Intravenous Every 8 hours 12/17/17 0815 12/23/17 0959   12/16/17 1400  cefTAZidime (FORTAZ) 2 g in sodium chloride 0.9 % 100 mL IVPB  Status:  Discontinued     2 g 200 mL/hr over 30 Minutes Intravenous Every 8 hours 12/16/17 1125 12/17/17 0800   12/15/17 0300  vancomycin (VANCOCIN) IVPB 1000 mg/200 mL premix  Status:  Discontinued     1,000 mg 200 mL/hr over 60 Minutes Intravenous Every 8 hours 12/14/17 1844 12/15/17 1133   12/14/17 1900  vancomycin (VANCOCIN) 1,750 mg in sodium chloride 0.9 % 500 mL IVPB     1,750 mg 250 mL/hr over 120 Minutes Intravenous  Once 12/14/17 1844 12/14/17 2343   12/14/17 1845  ceFEPIme (MAXIPIME) 2 g in sodium chloride 0.9 % 100 mL IVPB  Status:  Discontinued     2 g 200 mL/hr over 30 Minutes Intravenous Every 8 hours 12/14/17 1844 12/15/17 1133   12/14/17 1800  ceFEPIme (MAXIPIME) 2 g in sodium chloride 0.9 % 100 mL IVPB  Status:  Discontinued     2 g 200 mL/hr over 30 Minutes Intravenous  Once 12/14/17 1753 12/14/17 1844   12/14/17 1800  metroNIDAZOLE (FLAGYL) IVPB 500 mg  Status:  Discontinued     500 mg 100 mL/hr over 60 Minutes Intravenous Every 8 hours 12/14/17 1753 12/15/17 1133   12/14/17 1800  vancomycin (VANCOCIN) IVPB 1000 mg/200 mL premix  Status:  Discontinued     1,000 mg 200 mL/hr over 60 Minutes Intravenous  Once 12/14/17 1753 12/14/17 1844       Assessment/Plan 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 - POD7 - midline with staples, will need removal POD#14 - no BM yet - continue clamping trials today, check residuals q 6h, if tolerates NG clamped and residuals decreasing then may be able to start TF tomorrow or Wednesday.  - start reglan, 5 day course  ID -currently ceftazidime 11/19>> FEN -IVF, NPO/NGT clamping trials, TNA VTE -SCDs, sq heparin Foley -in place/per urology   LOS: 8 days    Obie Dredge, [facility] Pager: [phone]

## 2017-12-22 NOTE — Progress Notes (Signed)
Called to the patient's room by RT and two other RNs on the floor. Patient was vomiting and NG put to low intermittent suction. Will hold meds per tube for now. Also, patient's ETT cuff leaking and was changed per MD.

## 2017-12-22 NOTE — Progress Notes (Signed)
NAME:  [name], MRN:  [id number], DOB:  1980/07/11, LOS: 8 ADMISSION DATE:  12/14/2017, CONSULTATION DATE:  12/15/2017 REFERRING MD:  N/A, CHIEF COMPLAINT:  Abd pain with N/V  Brief History   [name] is a 37y/o male with PMH of Cerebral palsy, DVT on coumadin, HTN, and chronic constipation. He has also had a history of skin ulceration and breakdown.  Past Medical History Cerebral palsy, SBO s/p exploratory lap, DVT on coumadin, HTN, Neurogenic bladder  Significant Hospital Events   11/18 - Exploratory Laparotomy with lysis of adhesions 11/18 - Desaturation into 50s and initially unresponsive, now intubated and sedated 11/18 - Central Line placed, TPN started 11/19 - Foley placed requiring Urology 11/19 - UCx grew >100k Pseudomonas Aerg. 11/19 - Bil. Duplex U/S of LE neg for DVTs 11/20 - EEG: slowly most likely due to sedating medications; no signs of seizure activity  Consults:  [place]  Procedures:  Exploratory Lapartomy Bilateral doppler LE U/S EGG  Significant Diagnostic Tests:  CT Abd: Dilated small bowel with air-fluid levels to the distal small bowel compatible with distal small bowel obstruction. EEG: slowing most likely due to sedating medications; no signs of seizure acitivity  Micro Data:  BCx: 1/2 positive for Staph Aureus; likely contaminate cont to monitor Urine Cx: >100k Pseudomonas Aeruginosa MRSA screen: neg  Antimicrobials:  Ceftazidime 1g daily 11/20 >> 11/25  Interim history/subjective:  Remains sedated, continuing to wean off vent. Still no BMs with on acute events overnight.  Objective   Blood pressure 113/74, pulse 72, temperature 99.1 F (37.3 C), temperature source Oral, resp. rate 14, height 5\' 5"  (1.651 m), weight 101 kg, SpO2 98 %.    Vent Mode: PRVC FiO2 (%):  [40 %] 40 % Set Rate:  [14 bmp] 14 bmp Vt Set:  [490 mL] 490 mL PEEP:  [5 cmH20] 5 cmH20 Pressure Support:  [12 cmH20] 12 cmH20 Plateau Pressure:  [15  cmH20-20 cmH20] 19 cmH20   Intake/Output Summary (Last 24 hours) at 12/22/2017 0643 Last data filed at 12/22/2017 8099 Gross per 24 hour  Intake 2741.36 ml  Output 6685 ml  Net -3943.64 ml   Filed Weights   12/14/17 1823 12/17/17 0500  Weight: 90.7 kg 101 kg    Examination: General: more alert, opens eyes to verbal command HENT: ETT in place Lungs: CTAB, no wheezing or rhonchi Cardiovascular: RRR, no murmurs Abdomen: soft, obese, hypoactive bowel sounds, midline incision intact with no bleeding or drainage Extremities: no cyanosis, +1 edema bilat, air cushions Skin: no rashes Neuro: alert, opens eyes, can respond with head movement to voice GU: foley in place  Labs   CBC: Recent Labs  Lab 12/16/17 0303  12/18/17 0341 12/19/17 0516 12/20/17 0403 12/21/17 0437 12/22/17 0506  WBC 4.9   < > 6.1 6.3 5.4 4.7 5.9  NEUTROABS 3.0  --   --   --   --   --  3.9  HGB 10.4*   < > 11.0* 10.3* 9.8* 10.2* 10.4*  HCT 33.1*   < > 34.6* 32.8* 31.6* 32.2* 33.2*  MCV 93.0   < > 93.0 92.4 92.4 90.7 90.2  PLT 146*   < > 157 177 196 217 270   < > = values in this interval not displayed.    Basic Metabolic Panel: Recent Labs  Lab 12/17/17 0541 12/18/17 0341 12/19/17 0516 12/20/17 0403 12/21/17 0437 12/22/17 0506  NA 135 135 135 134* 135 135  K 4.0 3.9 4.2 3.9 3.3* 3.9  CL 108 108 107 105 101 100  CO2 24 24 25 25 29 30   GLUCOSE 144* 96 122* 137* 132* 142*  BUN 11 12 13 15 14 15   CREATININE 0.66 0.63 0.71 0.59* 0.65 0.67  CALCIUM 6.9* 7.0* 7.3* 7.7* 7.7* 8.0*  MG 1.8 2.3  --  1.9 1.9 2.1  PHOS 2.2* 2.7 2.2* 2.3* 2.9 3.1   GFR: Estimated Creatinine Clearance: 136.9 mL/min (by C-G formula based on SCr of 0.67 mg/dL). Recent Labs  Lab 12/16/17 1115 12/16/17 1527  12/19/17 0516 12/20/17 0403 12/21/17 0437 12/22/17 0506  WBC  --   --    < > 6.3 5.4 4.7 5.9  LATICACIDVEN 0.8 0.7  --   --   --   --   --    < > = values in this interval not displayed.    Liver Function  Tests: Recent Labs  Lab 12/16/17 0303 12/18/17 0341 12/22/17 0506  AST 17 17 27   ALT 15 13 23   ALKPHOS 39 45 53  BILITOT 1.1 0.3 0.4  PROT 4.4* 4.5* 5.1*  ALBUMIN 2.3* 2.1* 2.2*   No results for input(s): LIPASE, AMYLASE in the last 168 hours. No results for input(s): AMMONIA in the last 168 hours.  ABG    Component Value Date/Time   PHART 7.490 (H) 12/15/2017 1048   PCO2ART 31.7 (L) 12/15/2017 1048   PO2ART 204.0 (H) 12/15/2017 1048   HCO3 24.2 12/15/2017 1048   TCO2 25 12/15/2017 1048   O2SAT 100.0 12/15/2017 1048     Coagulation Profile: No results for input(s): INR, PROTIME in the last 168 hours.  Cardiac Enzymes: Recent Labs  Lab 12/16/17 1115 12/18/17 0341  CKTOTAL 135 272    HbA1C: No results found for: HGBA1C  CBG: Recent Labs  Lab 12/21/17 1119 12/21/17 1548 12/21/17 1923 12/21/17 2356 12/22/17 0400  GLUCAP 82 95 98 121* 109*    Resolved Hospital Problem list   Lactic Acidosis  Assessment & Plan:  Acute Hypoxic Respiratory Failure - Stable on vent. Still unclear etiology but likely multifactorial due to narcotics, aspiration, and post surgical respiratory depression - Cont to weaning down on vent-tolerating some weaning this am.  - Daily monitoring for SBT - intermittent agitation is improving; consider some of "agitation" may be baseline anti-spasmatic movement (per parents)  Small Bowel Obstruction s/p Exploratory Laparotomy - General Surgery following; continue NG tube with clamp trial; trial of meds per tube - Started TNA on 11/18; dosed per pharm - Cont airbed due to complications from skin ulcers and tissue breakdown from prior admissions - Cont D51/2NS w/ KCL  @ 29ml/hr  UTI - Stable. Patient had clean U/A but grew >100k Pseudomonas colonies on UCx. - Ceftazidime 2g q8hrs started on 11/20 - 11/25; scheduled to stop today - cont to monitor clinically   Cerebral Palsy with chronic spasticity - Resuming home meds per tube:  baclofen, flexeril, oral valium, zanaflex - Ativan 1mg  IV prn  DVT on chronic coumadin  - Cont Heparin drip with plan to transition to warfarin per home med; pharmacy to dose - Follow INR  HTN - Holding home meds metoprolol and HCTZ - Stable for now, IV Lopressor for  SBP >170  Paroxymsal Atrial Fibrilation. - Stable. Holding metoprolol for now, continue to monitor heart rate, IV Lopressor for HR > 120 - Restart home meds once he is taking oral  Best practice:  Diet: NPO, NG tube in place Pain/Anxiety/Delirium protocol (if indicated): Ativan prn VAP protocol (  if indicated): Initiated on 11/18 DVT prophylaxis: SDCs for now -> heparin to warfarin bridge GI prophylaxis: Pepcid 20mg  BID Mobility: bed/wheelchair bound Code Status: full Family Communication: Sister is HCPOA Disposition: ICU  Critical care time: > 30 min   Harolyn Rutherford, DO [place], PGY-2 12/22/2017

## 2017-12-23 LAB — GLUCOSE, CAPILLARY
GLUCOSE-CAPILLARY: 110 mg/dL — AB (ref 70–99)
GLUCOSE-CAPILLARY: 123 mg/dL — AB (ref 70–99)
GLUCOSE-CAPILLARY: 117 mg/dL — AB (ref 70–99)
GLUCOSE-CAPILLARY: 116 mg/dL — AB (ref 70–99)
Glucose-Capillary: 108 mg/dL — ABNORMAL HIGH (ref 70–99)

## 2017-12-23 LAB — BASIC METABOLIC PANEL
ANION GAP: 7 (ref 5–15)
BUN: 19 mg/dL (ref 6–20)
CHLORIDE: 99 mmol/L (ref 98–111)
CO2: 31 mmol/L (ref 22–32)
Calcium: 8.5 mg/dL — ABNORMAL LOW (ref 8.9–10.3)
Creatinine, Ser: 0.77 mg/dL (ref 0.61–1.24)
GFR calc non Af Amer: >60 mL/min (ref >60)
Glucose, Bld: 137 mg/dL — ABNORMAL HIGH (ref 70–99)
POTASSIUM: 3.9 mmol/L (ref 3.5–5.1)
SODIUM: 137 mmol/L (ref 135–145)

## 2017-12-23 LAB — CBC
HEMATOCRIT: 34.1 % — AB (ref 39.0–52.0)
HEMOGLOBIN: 10.9 g/dL — AB (ref 13.0–17.0)
MCH: 29.2 pg (ref 26.0–34.0)
MCHC: 32 g/dL (ref 30.0–36.0)
MCV: 91.4 fL (ref 80.0–100.0)
NRBC: 0 % (ref 0.0–0.2)
Platelets: 291 10*3/uL (ref 150–400)
RBC: 3.73 MIL/uL — AB (ref 4.22–5.81)
RDW: 14.2 % (ref 11.5–15.5)
WBC: 8.4 10*3/uL (ref 4.0–10.5)

## 2017-12-23 LAB — PHOSPHORUS: PHOSPHORUS: 4.1 mg/dL (ref 2.5–4.6)

## 2017-12-23 LAB — HEPARIN LEVEL (UNFRACTIONATED): Heparin Unfractionated: 0.33 IU/mL (ref 0.30–0.70)

## 2017-12-23 LAB — MAGNESIUM: Magnesium: 2 mg/dL (ref 1.7–2.4)

## 2017-12-23 MED ORDER — SODIUM CHLORIDE 0.9 % IV SOLN
300.0000 mg | Freq: Three times a day (TID) | INTRAVENOUS | Status: DC
  Administered 2017-12-23 – 2017-12-24 (×3): 300 mg via INTRAVENOUS
  Filled 2017-12-23 (×4): qty 6

## 2017-12-23 MED ORDER — SODIUM CHLORIDE 0.9 % IV SOLN: 3.0000 mg/kg | Freq: Three times a day (TID) | INTRAVENOUS | Status: DC

## 2017-12-23 MED ORDER — TRACE MINERALS CR-CU-MN-SE-ZN 10-1000-500-60 MCG/ML IV SOLN
INTRAVENOUS | Status: AC
  Administered 2017-12-23: 17:00:00 via INTRAVENOUS
  Filled 2017-12-23: qty 824

## 2017-12-23 MED ORDER — POTASSIUM CHLORIDE 20 MEQ/15ML (10%) PO SOLN
40.0000 meq | Freq: Three times a day (TID) | ORAL | Status: AC
  Administered 2017-12-23 (×2): 40 meq
  Filled 2017-12-23 (×2): qty 30

## 2017-12-23 MED ORDER — METOPROLOL TARTRATE 5 MG/5ML IV SOLN
5.0000 mg | Freq: Two times a day (BID) | INTRAVENOUS | Status: DC
  Administered 2017-12-23 – 2017-12-25 (×3): 5 mg via INTRAVENOUS
  Filled 2017-12-23 (×4): qty 5

## 2017-12-23 MED ORDER — ORAL CARE MOUTH RINSE
15.0000 mL | Freq: Two times a day (BID) | OROMUCOSAL | Status: DC
  Administered 2017-12-23 – 2017-12-28 (×10): 15 mL via OROMUCOSAL

## 2017-12-23 NOTE — Progress Notes (Signed)
ANTICOAGULATION CONSULT NOTE - Follow Up Consult  Pharmacy Consult for heparin Indication: DVT  Allergies  Allergen Reactions  . Betadine [Povidone Iodine] Other (See Comments)    Unknown reaction per caregiver  . Ciprofloxacin Other (See Comments)    Unknown reaction per caregiver  . Morphine Other (See Comments)    Unknown reaction per caregiver    Patient Measurements: Height: 5' 5" (165.1 cm) Weight: 222 lb 10.6 oz (101 kg) IBW/kg (Calculated) : 61.5 Heparin Dosing Weight: 84 kg  Vital Signs: Temp: 99.2 F (37.3 C) (11/26 0733) Temp Source: Oral (11/26 0733) BP: 100/53 (11/26 0845) Pulse Rate: 109 (11/26 0845)  Labs: Recent Labs    12/21/17 0437 12/22/17 0506 12/23/17 0502  HGB 10.2* 10.4* 10.9*  HCT 32.2* 33.2* 34.1*  PLT 217 270 291  HEPARINUNFRC 0.33 0.31 0.33  CREATININE 0.65 0.67 0.77    Estimated Creatinine Clearance: 136.9 mL/min (by C-G formula based on SCr of 0.77 mg/dL).   Medications:  Scheduled:  . baclofen  20 mg Per Tube QID  . chlorhexidine gluconate (MEDLINE KIT)  15 mL Mouth Rinse BID  . Chlorhexidine Gluconate Cloth  6 each Topical Daily  . cyclobenzaprine  5 mg Per Tube BID   And  . cyclobenzaprine  10 mg Per Tube QHS  . diazepam  2 mg Intravenous BID  . furosemide  40 mg Intravenous Q8H  . mouth rinse  15 mL Mouth Rinse 10 times per day  . metoCLOPramide (REGLAN) injection  10 mg Intravenous Q8H  . sodium chloride flush  10-40 mL Intracatheter Q12H  . tiZANidine  2 mg Per Tube TID    Assessment: 37 year old male with cerebral palsy. He was admitted with abdominal pain due to a SBO. He went for an ex lap which only showed a dilated small intestine without adhesions. He is currently on TPN while awaiting recovery of bowel function. 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 His INR and warfarin were reversed upon admission for surgery. Separately, he was found to have a urinary obstruction, he was a difficult cath placement and had  some meatal bleeding. This is now resolved. He was placed on a heparin infusion 11/20, to be continued until warfarin can be safely resumed.  Heparin level is therapeutic. hgb10.9, plts wnl.  Goal of Therapy:  Heparin level 0.3-0.7 units/ml Monitor platelets by anticoagulation protocol: Yes   Plan:  Continue current heparin infusion of 1450 units/hour. Continue to monitor H&H and platelets, CBC and HL daily.     12/23/2017,9:23 AM  

## 2017-12-23 NOTE — Plan of Care (Signed)
  Problem: Elimination: Goal: Will not experience complications related to bowel motility Outcome: Not Progressing   

## 2017-12-23 NOTE — Progress Notes (Signed)
Progress Note: General Surgery Service   Assessment/Plan: Principal Problem:   Small bowel obstruction (HCC) Active Problems:   Infantile cerebral palsy (HCC)   Atrial fibrillation (HCC)   Chronic constipation   Hypertension   Spasticity   Neurogenic bladder   Lactic acidosis   Anticoagulated   Respiratory failure ([place])   Encephalopathy acute  s/p Procedure(s): EXPLORATORY LAPAROTOMY 12/15/2017 -continue NG to suction, if no output throughout the morning may trial trophic TF -continue reglan for motility    LOS: 9 days  Chief Complaint/Subjective: Vomited during NG clamping trial yesterday  Objective: Vital signs in last 24 hours: Temp:  [99 F (37.2 C)-100.7 F (38.2 C)] 99.2 F (37.3 C) (11/26 0733) Pulse Rate:  [77-226] 226 (11/26 0930) Resp:  [14-26] 20 (11/26 0930) BP: (73-141)/(45-99) 122/78 (11/26 0930) SpO2:  [88 %-100 %] 88 % (11/26 0930) FiO2 (%):  [40 %] 40 % (11/26 0800) Last BM Date: 12/15/17  Intake/Output from previous day: 11/25 0701 - 11/26 0700 In: 3276.9 [I.V.:2286.6; NG/GT:495; IV Piggyback:495.3] Out: 1027 [OZDGU:4403; Emesis/NG output:700] Intake/Output this shift: Total I/O In: 171.5 [I.V.:171.5] Out: 284 [Urine:284]  Lungs: intubated on spon breathing trial  Cardiovascular: tachycardic  Abd: soft, mod distension, incision c/d/i  Extremities: trace edema  Neuro: moving all extremities  Lab Results: CBC  Recent Labs    12/22/17 0506 12/23/17 0502  WBC 5.9 8.4  HGB 10.4* 10.9*  HCT 33.2* 34.1*  PLT 270 291   BMET Recent Labs    12/22/17 0506 12/23/17 0502  NA 135 137  K 3.9 3.9  CL 100 99  CO2 30 31  GLUCOSE 142* 137*  BUN 15 19  CREATININE 0.67 0.77  CALCIUM 8.0* 8.5*   PT/INR No results for input(s): LABPROT, INR in the last 72 hours. ABG No results for input(s): PHART, HCO3 in the last 72 hours.  Invalid input(s): PCO2, PO2  Studies/Results:  Anti-infectives: Anti-infectives (From admission,  onward)   Start     Dose/Rate Route Frequency Ordered Stop   12/17/17 1600  cefTAZidime (FORTAZ) 1 g in sodium chloride 0.9 % 100 mL IVPB  Status:  Discontinued     1 g 200 mL/hr over 30 Minutes Intravenous Every 8 hours 12/17/17 0800 12/17/17 0815   12/17/17 0900  cefTAZidime (FORTAZ) 1 g in sodium chloride 0.9 % 100 mL IVPB     1 g 200 mL/hr over 30 Minutes Intravenous Every 8 hours 12/17/17 0815 12/23/17 0246   12/16/17 1400  cefTAZidime (FORTAZ) 2 g in sodium chloride 0.9 % 100 mL IVPB  Status:  Discontinued     2 g 200 mL/hr over 30 Minutes Intravenous Every 8 hours 12/16/17 1125 12/17/17 0800   12/15/17 0300  vancomycin (VANCOCIN) IVPB 1000 mg/200 mL premix  Status:  Discontinued     1,000 mg 200 mL/hr over 60 Minutes Intravenous Every 8 hours 12/14/17 1844 12/15/17 1133   12/14/17 1900  vancomycin (VANCOCIN) 1,750 mg in sodium chloride 0.9 % 500 mL IVPB     1,750 mg 250 mL/hr over 120 Minutes Intravenous  Once 12/14/17 1844 12/14/17 2343   12/14/17 1845  ceFEPIme (MAXIPIME) 2 g in sodium chloride 0.9 % 100 mL IVPB  Status:  Discontinued     2 g 200 mL/hr over 30 Minutes Intravenous Every 8 hours 12/14/17 1844 12/15/17 1133   12/14/17 1800  ceFEPIme (MAXIPIME) 2 g in sodium chloride 0.9 % 100 mL IVPB  Status:  Discontinued     2 g 200 mL/hr  over 30 Minutes Intravenous  Once 12/14/17 1753 12/14/17 1844   12/14/17 1800  metroNIDAZOLE (FLAGYL) IVPB 500 mg  Status:  Discontinued     500 mg 100 mL/hr over 60 Minutes Intravenous Every 8 hours 12/14/17 1753 12/15/17 1133   12/14/17 1800  vancomycin (VANCOCIN) IVPB 1000 mg/200 mL premix  Status:  Discontinued     1,000 mg 200 mL/hr over 60 Minutes Intravenous  Once 12/14/17 1753 12/14/17 1844      Medications: Scheduled Meds: . baclofen  20 mg Per Tube QID  . chlorhexidine gluconate (MEDLINE KIT)  15 mL Mouth Rinse BID  . Chlorhexidine Gluconate Cloth  6 each Topical Daily  . cyclobenzaprine  5 mg Per Tube BID   And  .  cyclobenzaprine  10 mg Per Tube QHS  . diazepam  2 mg Intravenous BID  . furosemide  40 mg Intravenous Q8H  . mouth rinse  15 mL Mouth Rinse 10 times per day  . metoCLOPramide (REGLAN) injection  10 mg Intravenous Q8H  . sodium chloride flush  10-40 mL Intracatheter Q12H  . tiZANidine  2 mg Per Tube TID   Continuous Infusions: . sodium chloride 10 mL/hr at 12/22/17 1900  . sodium chloride 10 mL/hr at 12/20/17 1329  . dexmedetomidine (PRECEDEX) IV infusion 0.4 mcg/kg/hr (12/23/17 0900)  . heparin 1,450 Units/hr (12/23/17 0900)  . midazolam (VERSED) infusion 2 mg/hr (12/23/17 0900)  . TPN ADULT (ION) 50 mL/hr at 12/23/17 0900   PRN Meds:.sodium chloride, acetaminophen, fentaNYL, haloperidol lactate, LORazepam, metoprolol tartrate, midazolam, naLOXone (NARCAN)  injection, ondansetron (ZOFRAN) IV, sodium chloride flush  Mickeal Skinner, MD Office number [phone] [facility]

## 2017-12-23 NOTE — Progress Notes (Signed)
Wasted 120ml versed in sink with Forestine Na, RN

## 2017-12-23 NOTE — Procedures (Signed)
Extubation Procedure Note  Patient Details:   Name: [name] DOB: November 09, 1980 MRN: [id number]   Airway Documentation:   Patient extubated per orders. Patient had positive cuff leak prior to extubation. Placed on 4 lpm humidified oxygen. RN at bedside Vent end date: 12/23/17 Vent end time: 1225   Evaluation  O2 sats: stable throughout Complications: No apparent complications Patient did tolerate procedure well. Bilateral Breath Sounds: Clear, Diminished   Yes  Ander Purpura 12/23/2017, 12:26 PM

## 2017-12-23 NOTE — Progress Notes (Signed)
PHARMACY - ADULT TOTAL PARENTERAL NUTRITION CONSULT NOTE   Pharmacy Consult for TPN Indication: Nutrition s/p surgery for small bowel obstruction  Patient Measurements: Height: 5\' 5"  (165.1 cm) Weight: 222 lb 10.6 oz (101 kg) IBW/kg (Calculated) : 61.5  AdjBW = 68.8 kg   Body mass index is 37.05 kg/m. Usual Weight: 90.7 kg  Assessment:  Patient admitted 11/17 by ED p/w abdominal pain, N/V, lactic acidosis to 6, WBC 13, CT scan supportive of SBO. Pt required immediate surgery. 11/18 0208 s/p surgery for SBO; found diffusely dilated small intestine without transition point, small amount of adhesions, no ischemia. Patient extubated in OR, transferred to 86M,  intubated due to insufficient oxygenation at 0918. TPN started because historically patient has had long post-op ileus.  GI: s.p ExLap with LoA on 11/18; Pre-albumin 11.7>10.6. No BM yet, few BS. NG o/p 600 cc/24h, NGT clamping trials continued 11/25 - may start TF in next day or two if tolerates. Start reglan x 5 days 11/25 -Vomiting last night 11/25 x 1 - no further episodes Endo: No h/o DM, BG <180. No SSI Insulin requirements in the past 24 hours: 0 Lytes: K stable 3.9 (S/p lasix + K runs x 2), phos up to 4.1, others WNL Renal: SCr 0.77 stable, UOP 1.4 mL/kg/hr on lasix 40 IV q8h. +8.4 L since admission  Pulm: Intubated, PRVC, FiO2 40%, PEEP 5 Cards: MAP 50-80s, NSR 80s Hepatobil: LFTs wnl, TG 96>140 Neuro: Change from fent>versed @4 , Dex @0 .5, baseline cerebal palsy, requires chronic antispasmodics. Intermittent agitation. Oral meds for spasms resumed in PO trial ID: Tmax/24h 100.7, WBC wnl, S/p ceftazidime for pseudomonas UTI 11/20-11/25 Heme: Pt on warfarin PTA for hx DVTs- currently on hold - on IV Hep. CBC stable  TPN Access: PICC 11/18>> TPN start date: 11/18 Nutritional Goals (per RD assessment on 11/19): Kcal:  8250-0370 Protein:  124 gm Fluid:  1.5-2 L  Current Nutrition:  TPN @ 50 mL/hr NPO  Plan:   Continue TPN at 50 mL/hr. Max conc TPN since 11/21. Lipids started 11/25 after holding for first 7 days for ICU patients per ASPEN guidelines This TPN provides 124 g of protein, 72 g of dextrose, and 36 g of lipids which provides 1099 kCals per day, meeting 100% of patient needs. Electrolytes in TPN: Cl:Ac 1:2, Continue 11/24 K increase (adjust with NG outpt/diuresis); Decrease phos 11/26 Continue MVI, trace elements, and famotidine 40mg  added to TPN  No insulin requirements on goal TPN - monitor CBGs Monitor TPN labs, toleration of NGT clamping trial and ability to start TF  Elicia Lamp, PharmD, BCPS Clinical Pharmacist Clinical phone [phone] Please check AMION for all [place] contact numbers 12/23/2017 7:55 AM

## 2017-12-23 NOTE — Progress Notes (Addendum)
NAME:  [name], MRN:  [id number], DOB:  May 31, 1980, LOS: 9 ADMISSION DATE:  12/14/2017, CONSULTATION DATE:  12/15/2017 REFERRING MD:  N/A, CHIEF COMPLAINT:  Abd pain with N/V  Brief History   [name] is a 37y/o male with PMH of Cerebral palsy, DVT on coumadin, HTN, and chronic constipation. He has also had a history of skin ulceration and breakdown.  Past Medical History Cerebral palsy, SBO s/p exploratory lap, DVT on coumadin, HTN, Neurogenic bladder  Significant Hospital Events   11/18 - Exploratory Laparotomy with lysis of adhesions 11/18 - Desaturation into 50s and initially unresponsive, now intubated and sedated 11/18 - Central Line placed, TPN started 11/19 - Foley placed requiring Urology 11/19 - UCx grew >100k Pseudomonas Aerg. 11/19 - Bil. Duplex U/S of LE neg for DVTs 11/20 - EEG: slowly most likely due to sedating medications; no signs of seizure activity  Consults:  Sugery  Procedures:  Exploratory Lapartomy Bilateral doppler LE U/S EGG  Significant Diagnostic Tests:  CT Abd: Dilated small bowel with air-fluid levels to the distal small bowel compatible with distal small bowel obstruction. EEG: slowing most likely due to sedating medications; no signs of seizure acitivity  Micro Data:  BCx: 1/2 positive for Staph Aureus; likely contaminate cont to monitor Urine Cx: >100k Pseudomonas Aeruginosa MRSA screen: neg  Antimicrobials:  Ceftazidime 1g daily 11/20 >> 11/25  Interim history/subjective:  Remains intubated but is more alert this am, had episode of emesis overnight with a cuff leak and a desaturation. ETT tube was changed and patient was sedated. Has done well since.  Objective   Blood pressure 93/78, pulse 83, temperature 99.2 F (37.3 C), temperature source Oral, resp. rate 18, height 5\' 5"  (1.651 m), weight 101 kg, SpO2 100 %.    Vent Mode: PSV;CPAP FiO2 (%):  [40 %] 40 % Set Rate:  [14 bmp-15 bmp] 14 bmp Vt Set:  [490 mL]  490 mL PEEP:  [5 cmH20] 5 cmH20 Pressure Support:  [5 cmH20-10 cmH20] 10 cmH20 Plateau Pressure:  [18 cmH20-21 cmH20] 21 cmH20   Intake/Output Summary (Last 24 hours) at 12/23/2017 0813 Last data filed at 12/23/2017 0700 Gross per 24 hour  Intake 3190.27 ml  Output 3900 ml  Net -709.73 ml   Filed Weights   12/14/17 1823 12/17/17 0500  Weight: 90.7 kg 101 kg    Examination: General: alert  Opens eyes to verbal stimuli and can keep them open HENT: ETT in place, NG in place Lungs: CTAB, no wheezing or rhonchi Cardiovascular: RRR, no murmurs Abdomen: soft, obese, improved distention, midline incision clean, dry, no leakage, +bowel sounds Extremities: no cyanosis, +1 edema, air cushions in place Skin: no rashes Neuro: alert, opens eyes to commands GU: foley in place  Labs   CBC: Recent Labs  Lab 12/19/17 0516 12/20/17 0403 12/21/17 0437 12/22/17 0506 12/23/17 0502  WBC 6.3 5.4 4.7 5.9 8.4  NEUTROABS  --   --   --  3.9  --   HGB 10.3* 9.8* 10.2* 10.4* 10.9*  HCT 32.8* 31.6* 32.2* 33.2* 34.1*  MCV 92.4 92.4 90.7 90.2 91.4  PLT 177 196 217 270 948    Basic Metabolic Panel: Recent Labs  Lab 12/18/17 0341 12/19/17 0516 12/20/17 0403 12/21/17 0437 12/22/17 0506 12/23/17 0502  NA 135 135 134* 135 135 137  K 3.9 4.2 3.9 3.3* 3.9 3.9  CL 108 107 105 101 100 99  CO2 24 25 25 29 30 31   GLUCOSE 96 122* 137*  132* 142* 137*  BUN 12 13 15 14 15 19   CREATININE 0.63 0.71 0.59* 0.65 0.67 0.77  CALCIUM 7.0* 7.3* 7.7* 7.7* 8.0* 8.5*  MG 2.3  --  1.9 1.9 2.1 2.0  PHOS 2.7 2.2* 2.3* 2.9 3.1 4.1   GFR: Estimated Creatinine Clearance: 136.9 mL/min (by C-G formula based on SCr of 0.77 mg/dL). Recent Labs  Lab 12/16/17 1115 12/16/17 1527  12/20/17 0403 12/21/17 0437 12/22/17 0506 12/23/17 0502  WBC  --   --    < > 5.4 4.7 5.9 8.4  LATICACIDVEN 0.8 0.7  --   --   --   --   --    < > = values in this interval not displayed.    Liver Function Tests: Recent Labs  Lab  12/18/17 0341 12/22/17 0506  AST 17 27  ALT 13 23  ALKPHOS 45 53  BILITOT 0.3 0.4  PROT 4.5* 5.1*  ALBUMIN 2.1* 2.2*   No results for input(s): LIPASE, AMYLASE in the last 168 hours. No results for input(s): AMMONIA in the last 168 hours.  ABG    Component Value Date/Time   PHART 7.490 (H) 12/15/2017 1048   PCO2ART 31.7 (L) 12/15/2017 1048   PO2ART 204.0 (H) 12/15/2017 1048   HCO3 24.2 12/15/2017 1048   TCO2 25 12/15/2017 1048   O2SAT 100.0 12/15/2017 1048     Coagulation Profile: No results for input(s): INR, PROTIME in the last 168 hours.  Cardiac Enzymes: Recent Labs  Lab 12/16/17 1115 12/18/17 0341  CKTOTAL 135 272    HbA1C: No results found for: HGBA1C  CBG: Recent Labs  Lab 12/22/17 1138 12/22/17 1552 12/22/17 1950 12/22/17 2319 12/23/17 0332  GLUCAP 125* 129* 100* 116* 110*    Resolved Hospital Problem list   Lactic Acidosis  Assessment & Plan:  Acute Hypoxic Respiratory Failure - Stable on vent. Still unclear etiology but likely multifactorial due to narcotics, aspiration, and post surgical respiratory depression - Cont to weaning down on vent, possible extubation today 11/26  - Daily monitoring for SBT - intermittent agitation is improving; consider some of "agitation" may be baseline anti-spasmatic movement (per parents)  Small Bowel Obstruction s/p Exploratory Laparotomy - General Surgery following; continue NG tube with clamp trial; trial of meds per tube - On 5 day course of reglan - Started TNA on 11/18; dosed per pharm - Cont airbed due to complications from skin ulcers and tissue breakdown from prior admissions  UTI. Treated, resolved - Stable. Patient had clean U/A but grew >100k Pseudomonas colonies on UCx. - Ceftazidime 2g q8hrs started on 11/20 - 11/25 - cont to monitor clinically   Cerebral Palsy with chronic spasticity - cont home meds per tube: baclofen, flexeril, oral valium, zanaflex - Ativan 1mg  IV prn  DVT on  chronic coumadin  - Cont Heparin drip with plan to transition to warfarin per home med; pharmacy to dose - Follow INR  HTN - Holding home meds metoprolol and HCTZ - Stable for now, IV Lopressor for  SBP >170  Paroxymsal Atrial Fibrilation. - Stable. Holding metoprolol for now, continue to monitor heart rate, IV Lopressor for HR > 120 - Restart home meds once he is taking oral  Best practice:  Diet: NPO, NG tube in place Pain/Anxiety/Delirium protocol (if indicated): Ativan prn VAP protocol (if indicated): Initiated on 11/18 DVT prophylaxis: SDCs for now -> heparin to warfarin bridge GI prophylaxis: Pepcid 20mg  BID Mobility: bed/wheelchair bound Code Status: full Family Communication: Sister is [employer] [facility]  Disposition: ICU  Critical care time: > 30 min   Harolyn Rutherford, [place], PGY-2 12/23/2017   Attending Note:  37 year old male presenting with SBO post op for adhesions who presents to PCCM with respiratory failure and sepsis.  Patient's bowel sounds remain quite with coarse BS on exam.  I reviewed CXR myself, ETT is in a good position with very low lung volumes.  Discussed with PCCM-resident.  Patient is off abx with low grade fever and no WBC.  Will d/c versed today.  Continue precedex.  Restarted home baclofen, cyclobenzaprine and valium by injection.  No family bedside to communicate with.  Will begin aggressive weaning.  Diureses for 2 doses of lasix today and replace K.  PCCM will continue to follow.  The patient is critically ill with multiple organ systems failure and requires high complexity decision making for assessment and support, frequent evaluation and titration of therapies, application of advanced monitoring technologies and extensive interpretation of multiple databases.   Critical Care Time devoted to patient care services described in this note is  33  Minutes. This time reflects time of care of this signee Dr Jennet Maduro. This critical care time does  not reflect procedure time, or teaching time or supervisory time of PA/NP/Med student/Med Resident etc but could involve care discussion time.  Rush Farmer, M.D. [facility] Pulmonary/Critical Care Medicine. Pager: [phone]. After hours pager: [phone].

## 2017-12-24 ENCOUNTER — Inpatient Hospital Stay (HOSPITAL_COMMUNITY): Payer: Medicare Other

## 2017-12-24 DIAGNOSIS — L8990 Pressure ulcer of unspecified site, unspecified stage: Secondary | ICD-10-CM

## 2017-12-24 LAB — CBC
HEMATOCRIT: 33.6 % — AB (ref 39.0–52.0)
HEMOGLOBIN: 10.4 g/dL — AB (ref 13.0–17.0)
MCH: 28.6 pg (ref 26.0–34.0)
MCHC: 31 g/dL (ref 30.0–36.0)
MCV: 92.3 fL (ref 80.0–100.0)
Platelets: 280 10*3/uL (ref 150–400)
RBC: 3.64 MIL/uL — ABNORMAL LOW (ref 4.22–5.81)
RDW: 14.2 % (ref 11.5–15.5)
WBC: 6.1 10*3/uL (ref 4.0–10.5)
nRBC: 0 % (ref 0.0–0.2)

## 2017-12-24 LAB — COMPREHENSIVE METABOLIC PANEL
ALBUMIN: 2.4 g/dL — AB (ref 3.5–5.0)
ALK PHOS: 198 U/L — AB (ref 38–126)
ALT: 102 U/L — ABNORMAL HIGH (ref 0–44)
AST: 146 U/L — ABNORMAL HIGH (ref 15–41)
Anion gap: 5 (ref 5–15)
BILIRUBIN TOTAL: 2.6 mg/dL — AB (ref 0.3–1.2)
BUN: 23 mg/dL — ABNORMAL HIGH (ref 6–20)
CALCIUM: 8.7 mg/dL — AB (ref 8.9–10.3)
CO2: 32 mmol/L (ref 22–32)
Chloride: 104 mmol/L (ref 98–111)
Creatinine, Ser: 0.78 mg/dL (ref 0.61–1.24)
GFR calc Af Amer: >60 mL/min (ref >60)
GFR calc non Af Amer: >60 mL/min (ref >60)
Glucose, Bld: 121 mg/dL — ABNORMAL HIGH (ref 70–99)
Potassium: 3.9 mmol/L (ref 3.5–5.1)
Sodium: 141 mmol/L (ref 135–145)
TOTAL PROTEIN: 5.6 g/dL — AB (ref 6.5–8.1)

## 2017-12-24 LAB — HEPARIN LEVEL (UNFRACTIONATED)
Heparin Unfractionated: <0.1 IU/mL — ABNORMAL LOW (ref 0.30–0.70)
Heparin Unfractionated: <0.1 IU/mL — ABNORMAL LOW (ref 0.30–0.70)
Heparin Unfractionated: 0.24 IU/mL — ABNORMAL LOW (ref 0.30–0.70)

## 2017-12-24 LAB — BLOOD GAS, ARTERIAL
ACID-BASE EXCESS: 7.3 mmol/L — AB (ref 0.0–2.0)
BICARBONATE: 31.3 mmol/L — AB (ref 20.0–28.0)
Drawn by: 345601
O2 CONTENT: 3 L/min
O2 Saturation: 94.9 %
PCO2 ART: 44.5 mmHg (ref 32.0–48.0)
PO2 ART: 74.7 mmHg — AB (ref 83.0–108.0)
Patient temperature: 98.6
pH, Arterial: 7.461 — ABNORMAL HIGH (ref 7.350–7.450)

## 2017-12-24 LAB — GLUCOSE, CAPILLARY
GLUCOSE-CAPILLARY: 115 mg/dL — AB (ref 70–99)
GLUCOSE-CAPILLARY: 128 mg/dL — AB (ref 70–99)
Glucose-Capillary: 103 mg/dL — ABNORMAL HIGH (ref 70–99)
Glucose-Capillary: 122 mg/dL — ABNORMAL HIGH (ref 70–99)
Glucose-Capillary: 90 mg/dL (ref 70–99)
Glucose-Capillary: 80 mg/dL (ref 70–99)
Glucose-Capillary: 93 mg/dL (ref 70–99)

## 2017-12-24 LAB — PHOSPHORUS: PHOSPHORUS: 3.9 mg/dL (ref 2.5–4.6)

## 2017-12-24 LAB — CK: Total CK: 95 U/L (ref 49–397)

## 2017-12-24 LAB — MAGNESIUM: MAGNESIUM: 2.1 mg/dL (ref 1.7–2.4)

## 2017-12-24 MED ORDER — ORAL CARE MOUTH RINSE
15.0000 mL | Freq: Two times a day (BID) | OROMUCOSAL | Status: DC
  Administered 2017-12-25 (×2): 15 mL via OROMUCOSAL

## 2017-12-24 MED ORDER — TRACE MINERALS CR-CU-MN-SE-ZN 10-1000-500-60 MCG/ML IV SOLN
INTRAVENOUS | Status: DC
  Administered 2017-12-24: 18:00:00 via INTRAVENOUS
  Filled 2017-12-24: qty 824

## 2017-12-24 MED ORDER — ERYTHROMYCIN BASE 250 MG PO TABS
250.0000 mg | ORAL_TABLET | Freq: Three times a day (TID) | ORAL | Status: DC
  Administered 2017-12-24 – 2017-12-28 (×10): 250 mg via ORAL
  Filled 2017-12-24 (×15): qty 1

## 2017-12-24 MED ORDER — CHLORHEXIDINE GLUCONATE 0.12 % MT SOLN
15.0000 mL | Freq: Two times a day (BID) | OROMUCOSAL | Status: DC
  Administered 2017-12-24 – 2017-12-28 (×8): 15 mL via OROMUCOSAL
  Filled 2017-12-24 (×8): qty 15

## 2017-12-24 MED ORDER — ZINC OXIDE 40 % EX OINT
TOPICAL_OINTMENT | Freq: Four times a day (QID) | CUTANEOUS | Status: DC
  Administered 2017-12-24 (×2): 1 via TOPICAL
  Administered 2017-12-25 – 2017-12-28 (×12): via TOPICAL
  Filled 2017-12-24 (×3): qty 57

## 2017-12-24 NOTE — Evaluation (Signed)
Clinical/Bedside Swallow Evaluation Patient Details  Name: [name] MRN: [id number] Date of Birth: 1980/02/08  Today's Date: 12/24/2017 Time: SLP Start Time (ACUTE ONLY): 1346 SLP Stop Time (ACUTE ONLY): 1405 SLP Time Calculation (min) (ACUTE ONLY): 19 min  Past Medical History:  Past Medical History:  Diagnosis Date  . Cerebral palsy ([place])   . Chronic constipation   . Coagulopathy ([place])   . Hypertension   . Insomnia   . Recurrent UTI   . Tachycardia    Past Surgical History:  Past Surgical History:  Procedure Laterality Date  . ELBOW SURGERY  09/12/10   left  . INCISE AND DRAIN ABCESS  09/11/10  . LAPAROTOMY N/A 12/15/2017   Procedure: EXPLORATORY LAPAROTOMY;  Surgeon: Kieth Brightly Arta Bruce, MD;  Location: [place];  Service: General;  Laterality: N/A;  . SMALL INTESTINE SURGERY    . TOOTH EXTRACTION  11/22/2011   Procedure: DENTAL RESTORATION/EXTRACTIONS;  Surgeon: Marcelo Baldy, MD;  Location: [place];  Service: Dentistry;  Laterality: N/A;  dental restoration and cleaning; NO EXTRACTIONS   HPI:  Pt is a 37y/o male presenting with SBO s/p ex lap 11/18 and acute respiratory failure requiring ETT 11/18-11/26. PMH of Cerebral palsy, DVT on coumadin, HTN, and chronic constipation.   Assessment / Plan / Recommendation Clinical Impression  Pt's RR is elevated up to the low 40s throughout evaluation and although his vocal quality sounds clear, his cough appears weak and non-productive. He has consistent, delayed coughing with even small straw sips of water. He also has multiple swallows, facial grimacing, and whole body jerking as he tries to swallow. He has minimal oral manipulation with ice chips, admitting to swallowing them whole before they melt or can be masticated. Recommend waiting to start PO diet for respiratory status to further stabilize and s/s of aspiration to decrease. Could offer small amounts of water via swab after oral care if well positioned and not tachypneic. He  may benefit from FEES to assess readiness to start diet, but prognosis overall is good with additional time post-extubation. SLP Visit Diagnosis: Dysphagia, unspecified (R13.10)    Aspiration Risk  Moderate aspiration risk    Diet Recommendation NPO   Medication Administration: Via alternative means    Other  Recommendations Oral Care Recommendations: Oral care QID Other Recommendations: Have oral suction available   Follow up Recommendations (tba)      Frequency and Duration min 2x/week  2 weeks       Prognosis Prognosis for Safe Diet Advancement: Good      Swallow Study   General HPI: Pt is a 37y/o male presenting with SBO s/p ex lap 11/18 and acute respiratory failure requiring ETT 11/18-11/26. PMH of Cerebral palsy, DVT on coumadin, HTN, and chronic constipation. Type of Study: Bedside Swallow Evaluation Previous Swallow Assessment: none in chart; per RN, pt with no diet restrictions at baseline Diet Prior to this Study: NPO;Other (Comment)(NGT in place for suction but currently turned off) Temperature Spikes Noted: Yes(101.3) Respiratory Status: Nasal cannula History of Recent Intubation: Yes Length of Intubations (days): 10 days Date extubated: 12/23/17 Behavior/Cognition: Alert;Cooperative;Pleasant mood Oral Cavity Assessment: Excessive secretions Oral Care Completed by SLP: Recent completion by staff(RN completed during eval) Oral Cavity - Dentition: Adequate natural dentition;Poor condition Self-Feeding Abilities: Total assist Patient Positioning: Postural control adequate for testing Baseline Vocal Quality: Normal Volitional Cough: Weak Volitional Swallow: Able to elicit    Oral/Motor/Sensory Function Overall Oral Motor/Sensory Function: (reduced ROM that pt reports is baseline)   Ice Chips  Ice chips: Impaired Presentation: Spoon Oral Phase Impairments: Impaired mastication(pt swallows them whole) Pharyngeal Phase Impairments: Multiple swallows   Thin  Liquid Thin Liquid: Impaired Presentation: Spoon;Straw(swab) Oral Phase Impairments: Reduced lingual movement/coordination Pharyngeal  Phase Impairments: Suspected delayed Swallow;Multiple swallows;Cough - Delayed    Nectar Thick Nectar Thick Liquid: Not tested   Honey Thick Honey Thick Liquid: Not tested   Puree Puree: Not tested   Solid     Solid: Not tested      Germain Osgood 12/24/2017,2:40 PM  Germain Osgood, M.A. [place] Acute Environmental education officer [phone] Office [phone]

## 2017-12-24 NOTE — Progress Notes (Signed)
PHARMACY - ADULT TOTAL PARENTERAL NUTRITION CONSULT NOTE   Pharmacy Consult for TPN Indication: Nutrition s/p surgery for small bowel obstruction  Patient Measurements: Height: 5' 5"  (165.1 cm) Weight: 202 lb 9.6 oz (91.9 kg) IBW/kg (Calculated) : 61.5  AdjBW = 68.8 kg   Body mass index is 33.71 kg/m. Usual Weight: 90.7 kg  Assessment:  Patient admitted 11/17 by ED p/w abdominal pain, N/V, lactic acidosis to 6, WBC 13, CT scan supportive of SBO. Pt required immediate surgery. 11/18 0208 s/p surgery for SBO; found diffusely dilated small intestine without transition point, small amount of adhesions, no ischemia. Patient extubated in OR, transferred to 93M,  intubated due to insufficient oxygenation at 0918. TPN started because historically patient has had long post-op ileus.  GI: s.p ExLap with LoA on 11/18; Pre-albumin 11.7>10.6. No BM yet, +flatus. NG o/p 200 cc/24h, NGT clamping today, may remove this afternoon - may start TF in next day or two if tolerates. Start reglan x 5 days 11/25, erythromycin 300 IV q8h -Vomiting night of 11/25 x 1 - no further episodes Endo: No h/o DM, BG <180. No SSI Insulin requirements in the past 24 hours: 0 Lytes: K stable 3.9 (S/p lasix + KCl), others WNL Renal: SCr 0.78 stable, UOP 2.2 mL/kg/hr on lasix 40 IV q8h. +5.3 L since admission  Pulm: Extubated 11/26 > 2L [place] Cards: MAP 50-80s, NSR 80s Hepatobil: LFTs and alk phos now elevated, Tbili 2.6, TG 96>140 Neuro: Change from fent>versed @4 , Dex @0 .4, baseline cerebal palsy, requires chronic antispasmodics. Intermittent agitation. Oral meds for spasms resumed in PO trial ID: Tmax/24h 101.3, WBC wnl, S/p ceftazidime for pseudomonas UTI 11/20-11/25 Heme: Pt on warfarin PTA for hx DVTs- currently on hold - on IV Hep. CBC stable  TPN Access: PICC 11/18>> TPN start date: 11/18 Nutritional Goals (per RD assessment on 11/25): Kcal:  3112-1624 Protein:  124 gm Fluid:  1.5-2 L  Current Nutrition:  TPN  @ 50 mL/hr NPO  Plan:  Continue TPN at 50 mL/hr. Max conc TPN since 11/21. Lipids started 11/25 after holding for first 7 days for ICU patients per ASPEN guidelines This TPN provides 124 g of protein, 72 g of dextrose, and 36 g of lipids which provides 1099 kCals per day, meeting 100% of patient needs. Electrolytes in TPN: Cl:Ac 1:2, Continue 11/24 K increase (adjust with NG outpt/diuresis); Decrease phos 11/26 Continue MVI, trace elements, and famotidine 44m added to TPN  No insulin requirements on goal TPN - monitor CBGs Monitor TPN labs, toleration of NGT clamping trial and ability to start TF   JRenold Genta PharmD, BCPS Clinical Pharmacist Clinical phone for 12/24/2017 until 3p is [phone] 12/24/2017 7:06 AM  **Pharmacist phone directory can now be found on [email or web address] listed under [facility] [place]*

## 2017-12-24 NOTE — Consult Note (Signed)
[place] Nurse wound consult note Patient receiving care in [facility] 2M12.  No family present. Reason for Consult: sacral wound Wound type: the gluteal fold and buttocks tissue immediately surrounding the upper gluteal fold are impaired from Moisture associated skin damage (MASD).  On the upper right portion of the buttock/gluteal fold area is a 1 cm x 0.3 cm maroon colored linear mark that is consistent with a DTPI.  All other areas around this are MASD. Pressure Injury POA: No Dressing procedure/placement/frequency: Apply 40% Desitin to sacrum, coccyx, buttocks, gluteal fold.  Do NOT cover with a foam dressing. Monitor the wound area(s) for worsening of condition such as: Signs/symptoms of infection,  Increase in size,  Development of or worsening of odor, Development of pain, or increased pain at the affected locations.  Notify the medical team if any of these develop.  Thank you for the consult.  Discussed plan of care with the patient and bedside nurse.  [place] nurse will not follow at this time.  Please re-consult the [place] team if needed.  Val Riles, RN, MSN, CWOCN, CNS-BC, pager [phone]

## 2017-12-24 NOTE — Progress Notes (Addendum)
NAME:  [name], MRN:  [id number], DOB:  [birth year], LOS: 10 ADMISSION DATE:  12/14/2017, CONSULTATION DATE:  12/15/2017 REFERRING MD:  N/A, CHIEF COMPLAINT:  Abd pain with N/V  Brief History   [name] is a 37y/o male with PMH of Cerebral palsy, DVT on coumadin, HTN, and chronic constipation. He has also had a history of skin ulceration and breakdown.  Past Medical History Cerebral palsy, SBO s/p exploratory lap, DVT on coumadin, HTN, Neurogenic bladder  Significant Hospital Events   11/18 - Exploratory Laparotomy with lysis of adhesions 11/18 - Desaturation into 50s and initially unresponsive, now intubated and sedated 11/18 - Central Line placed, TPN started 11/19 - Foley placed requiring Urology 11/19 - UCx grew >100k Pseudomonas Aerg. 11/19 - Bil. Duplex U/S of LE neg for DVTs 11/20 - EEG: slowly most likely due to sedating medications; no signs of seizure activity  Consults:  Sugery  Procedures:  Exploratory Lapartomy Bilateral doppler LE U/S EGG  Significant Diagnostic Tests:  CT Abd: Dilated small bowel with air-fluid levels to the distal small bowel compatible with distal small bowel obstruction. EEG: slowing most likely due to sedating medications; no signs of seizure acitivity  Micro Data:  BCx: 1/2 positive for Staph Aureus; likely contaminate cont to monitor Urine Cx: >100k Pseudomonas Aeruginosa MRSA screen: neg  Antimicrobials:  Ceftazidime 1g daily 11/20 >> 11/25  Interim history/subjective:  Patient extubated yesterday and was able to maintain good O2 and protect airway. He is alert this am and able to verbally communicate although it is limited. He appears to be feeling better and nods "yes" when he is asked.  Objective   Blood pressure 108/70, pulse (!) 101, temperature 98.6 F (37 C), temperature source Oral, resp. rate (!) 24, height 5\' 5"  (1.651 m), weight 91.9 kg, SpO2 100 %.    Vent Mode: PSV;CPAP FiO2 (%):  [40 %] 40  % PEEP:  [5 cmH20] 5 cmH20 Pressure Support:  [5 cmH20] 5 cmH20   Intake/Output Summary (Last 24 hours) at 12/24/2017 0846 Last data filed at 12/24/2017 0645 Gross per 24 hour  Intake 1988.07 ml  Output 4832 ml  Net -2843.93 ml   Filed Weights   12/17/17 0500 12/23/17 2300 12/24/17 0415  Weight: 101 kg 91.9 kg 91.9 kg    Examination: General: alert, able to communicate verbally HENT: NG in place Lungs: CTAB no wheezing or rhonchi Cardiovascular: Reg rhythm, tachycardia, no murmurs Abdomen: soft, obese, midline incision clean, dry with staples, no oozing, +bowel sounds Extremities: no cyanosis, mild edema in left lower extremity Skin: no rash Neuro: alert, able to communicate verbally, spastic quadrapelgic GU: foley in place  Labs   CBC: Recent Labs  Lab 12/20/17 0403 12/21/17 0437 12/22/17 0506 12/23/17 0502 12/24/17 0418  WBC 5.4 4.7 5.9 8.4 6.1  NEUTROABS  --   --  3.9  --   --   HGB 9.8* 10.2* 10.4* 10.9* 10.4*  HCT 31.6* 32.2* 33.2* 34.1* 33.6*  MCV 92.4 90.7 90.2 91.4 92.3  PLT 196 217 270 291 767    Basic Metabolic Panel: Recent Labs  Lab 12/20/17 0403 12/21/17 0437 12/22/17 0506 12/23/17 0502 12/24/17 0418  NA 134* 135 135 137 141  K 3.9 3.3* 3.9 3.9 3.9  CL 105 101 100 99 104  CO2 25 29 30 31  32  GLUCOSE 137* 132* 142* 137* 121*  BUN 15 14 15 19  23*  CREATININE 0.59* 0.65 0.67 0.77 0.78  CALCIUM 7.7* 7.7* 8.0* 8.5*  8.7*  MG 1.9 1.9 2.1 2.0 2.1  PHOS 2.3* 2.9 3.1 4.1 3.9   GFR: Estimated Creatinine Clearance: 130.5 mL/min (by C-G formula based on SCr of 0.78 mg/dL). Recent Labs  Lab 12/21/17 0437 12/22/17 0506 12/23/17 0502 12/24/17 0418  WBC 4.7 5.9 8.4 6.1    Liver Function Tests: Recent Labs  Lab 12/18/17 0341 12/22/17 0506 12/24/17 0418  AST 17 27 146*  ALT 13 23 102*  ALKPHOS 45 53 198*  BILITOT 0.3 0.4 2.6*  PROT 4.5* 5.1* 5.6*  ALBUMIN 2.1* 2.2* 2.4*   No results for input(s): LIPASE, AMYLASE in the last 168  hours. No results for input(s): AMMONIA in the last 168 hours.  ABG    Component Value Date/Time   PHART 7.461 (H) 12/24/2017 0330   PCO2ART 44.5 12/24/2017 0330   PO2ART 74.7 (L) 12/24/2017 0330   HCO3 31.3 (H) 12/24/2017 0330   TCO2 25 12/15/2017 1048   O2SAT 94.9 12/24/2017 0330     Coagulation Profile: No results for input(s): INR, PROTIME in the last 168 hours.  Cardiac Enzymes: Recent Labs  Lab 12/18/17 0341  CKTOTAL 272    HbA1C: No results found for: HGBA1C  CBG: Recent Labs  Lab 12/23/17 1524 12/23/17 1952 12/23/17 2358 12/24/17 0405 12/24/17 0738  GLUCAP 116* 108* 128* 103* [name]  Assessment & Plan:  Acute Hypoxic Respiratory Failure, now resolved - Still unclear etiology but likely multifactorial due to narcotics, aspiration, and post surgical respiratory depression - Extubated on 11/26, doing well  - Transfer to Stepdown, not needing ventilation or pressors - cont to monitor  Small Bowel Obstruction s/p Exploratory Laparotomy - General Surgery following; continue NG tube with clamp trial; meds per tube - Stopped reglan started on 11/25 - Erythromycin to oral Via tube for gastroparesis - Started TNA on 11/18; dosed per pharm - Cont airbed due to complications from skin ulcers and tissue breakdown from prior admissions  UTI. Treated, resolved - Stable. Patient had clean U/A but grew >100k Pseudomonas colonies on UCx. - Ceftazidime 2g q8hrs started on 11/20 - 11/25 - cont to monitor clinically   Cerebral Palsy with chronic spasticity - cont home meds per tube: baclofen, flexeril, oral valium, zanaflex - Ativan 1mg  IV prn  DVT on chronic coumadin  - Cont Heparin drip with plan to transition to warfarin per home med; pharmacy to dose - Follow INR  HTN - Holding home meds metoprolol and HCTZ - Stable for now, IV Lopressor for  SBP >170  Paroxymsal Atrial Fibrilation. - Stable.  Holding metoprolol for now, continue to monitor heart rate, IV Lopressor for HR > 120 - Restart home meds once he is taking oral  Best practice:  Diet: NPO, NG tube in place Pain/Anxiety/Delirium protocol (if indicated): Ativan prn VAP protocol (if indicated): Initiated on 11/18 DVT prophylaxis: SDCs for now -> heparin to warfarin bridge GI prophylaxis: Pepcid 20mg  BID Mobility: bed/wheelchair bound Code Status: full Family Communication: Sister is HCPOA Disposition: ICU  Critical care time: > 30 min   Harolyn Rutherford, DO [place], PGY-2 12/24/2017

## 2017-12-24 NOTE — Progress Notes (Signed)
ANTICOAGULATION CONSULT NOTE - Follow Up Consult  Pharmacy Consult for heparin Indication: DVT  Allergies  Allergen Reactions  . Betadine [Povidone Iodine] Other (See Comments)    Unknown reaction per caregiver  . Ciprofloxacin Other (See Comments)    Unknown reaction per caregiver  . Morphine Other (See Comments)    Unknown reaction per caregiver    Patient Measurements: Height: 5\' 5"  (165.1 cm) Weight: 202 lb 9.6 oz (91.9 kg) IBW/kg (Calculated) : 61.5 Heparin Dosing Weight: 84 kg  Vital Signs: Temp: 98.6 F (37 C) (11/27 0700) Temp Source: Oral (11/27 0700) BP: 108/70 (11/27 0700) Pulse Rate: 101 (11/27 0700)  Labs: Recent Labs    12/22/17 0506 12/23/17 0502 12/24/17 0418 12/24/17 0621  HGB 10.4* 10.9* 10.4*  --   HCT 33.2* 34.1* 33.6*  --   PLT 270 291 280  --   HEPARINUNFRC 0.31 0.33 <0.10* <0.10*  CREATININE 0.67 0.77 0.78  --     Estimated Creatinine Clearance: 130.5 mL/min (by C-G formula based on SCr of 0.78 mg/dL).   Medications:  Scheduled:  . baclofen  20 mg Per Tube QID  . Chlorhexidine Gluconate Cloth  6 each Topical Daily  . cyclobenzaprine  5 mg Per Tube BID   And  . cyclobenzaprine  10 mg Per Tube QHS  . diazepam  2 mg Intravenous BID  . furosemide  40 mg Intravenous Q8H  . mouth rinse  15 mL Mouth Rinse BID  . metoCLOPramide (REGLAN) injection  10 mg Intravenous Q8H  . metoprolol tartrate  5 mg Intravenous Q12H  . sodium chloride flush  10-40 mL Intracatheter Q12H  . tiZANidine  2 mg Per Tube TID    Assessment: 37 year old male with cerebral palsy. He was admitted with abdominal pain due to a SBO. He went for an ex lap which only showed a dilated small intestine without adhesions. He is currently on TPN while awaiting recovery of bowel function. 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 His INR and warfarin were reversed upon admission for surgery. Separately, he was found to have a urinary obstruction, he was a difficult cath  placement and had some meatal bleeding. This is now resolved. He was placed on a heparin infusion 11/20, to be continued until warfarin can be safely resumed.  11/27: AM heparin level undetectable on 1450 units/hr. Heparin previously at the lower end of therapeutic on 1450 units/hr. Discussed with RN and the pump was set to 14.5 units/hr instead of 14.40mL/hr.  Adjusted to appropriate rate. Hgb 10.4 Pltc 202. Will recheck level this afternoon.  Goal of Therapy:  Heparin level 0.3-0.7 units/ml  Monitor platelets by anticoagulation protocol: Yes   Plan:  Continue heparin at 1450 units/hr Heparin level in 6 hours Continue to monitor H&H and platelets, CBC and HL daily.  Thank you for involving pharmacy in this patient's care.  Janae Bridgeman, PharmD PGY1 Pharmacy Resident Phone: [phone] 12/24/2017 8:10 AM

## 2017-12-24 NOTE — Progress Notes (Signed)
Nutrition Follow-up  DOCUMENTATION CODES:   Obesity unspecified  INTERVENTION:    Continue TPN. See re-estimated needs below.   Consider feeding via NGT with Jevity 1.2 at 70 ml/h with Pro-stat 30 ml once daily to provide 2116 kcal, 108 gm protein, 1361 ml free water daily  NUTRITION DIAGNOSIS:   Inadequate oral intake related to inability to eat as evidenced by NPO status.  Ongoing  GOAL:   Provide needs based on ASPEN/SCCM guidelines  Met with TPN  MONITOR:   Diet advancement, PO intake, Skin, I & O's  ASSESSMENT:   37 yo male with PMH of cerebral palsy, recurrent UTIs, tachycardia, chronic constipation, and HTN who was admitted on 11/17 with SBO. S/P ex lap with lysis of adhesions 11/18.   Patient was extubated 11/26.  NGT in place to suction.  Receiving TPN at 50 ml/h providing 1099 kcal and 124 gm protein per day.   S/P SLP bedside swallow evaluation today. Remains NPO, plans for FEES soon for hopeful diet advancement.  Labs and medications reviewed.    Diet Order:   Diet Order            Diet NPO time specified  Diet effective now              EDUCATION NEEDS:   No education needs have been identified at this time  Skin:  Skin Assessment: Skin Integrity Issues: Skin Integrity Issues:: Stage II Stage II: coccyx  Last BM:  11/18  Height:   Ht Readings from Last 1 Encounters:  12/15/17 5' 5"  (1.651 m)    Weight:   Wt Readings from Last 1 Encounters:  12/24/17 91.9 kg    Ideal Body Weight:  61.8 kg  BMI:  Body mass index is 33.71 kg/m.  Estimated Nutritional Needs:   Kcal:  1900-2100  Protein:  100-115 gm  Fluid:  2 L    Molli Barrows, RD, LDN, [place] Pager [phone] After Hours Pager [phone]

## 2017-12-24 NOTE — Progress Notes (Signed)
ANTICOAGULATION CONSULT NOTE - Follow Up Consult  Pharmacy Consult for heparin Indication: DVT  Allergies  Allergen Reactions  . Betadine [Povidone Iodine] Other (See Comments)    Unknown reaction per caregiver  . Ciprofloxacin Other (See Comments)    Unknown reaction per caregiver  . Morphine Other (See Comments)    Unknown reaction per caregiver    Patient Measurements: Height: 5\' 5"  (165.1 cm) Weight: 202 lb 9.6 oz (91.9 kg) IBW/kg (Calculated) : 61.5 Heparin Dosing Weight: 84 kg  Vital Signs: Temp: 98.4 F (36.9 C) (11/27 1100) Temp Source: Oral (11/27 1100) BP: 117/86 (11/27 1500) Pulse Rate: 126 (11/27 1600)  Labs: Recent Labs    12/22/17 0506 12/23/17 0502 12/24/17 0418 12/24/17 0621 12/24/17 1500  HGB 10.4* 10.9* 10.4*  --   --   HCT 33.2* 34.1* 33.6*  --   --   PLT 270 291 280  --   --   HEPARINUNFRC 0.31 0.33 <0.10* <0.10* 0.24*  CREATININE 0.67 0.77 0.78  --   --     Estimated Creatinine Clearance: 130.5 mL/min (by C-G formula based on SCr of 0.78 mg/dL).   Medications:  Scheduled:  . baclofen  20 mg Per Tube QID  . chlorhexidine  15 mL Mouth Rinse BID  . Chlorhexidine Gluconate Cloth  6 each Topical Daily  . cyclobenzaprine  5 mg Per Tube BID   And  . cyclobenzaprine  10 mg Per Tube QHS  . diazepam  2 mg Intravenous BID  . erythromycin  250 mg Oral TID  . liver oil-zinc oxide   Topical QID  . mouth rinse  15 mL Mouth Rinse BID  . mouth rinse  15 mL Mouth Rinse q12n4p  . mouth rinse  15 mL Mouth Rinse q12n4p  . metoprolol tartrate  5 mg Intravenous Q12H  . sodium chloride flush  10-40 mL Intracatheter Q12H  . tiZANidine  2 mg Per Tube TID    Assessment: 37 year old male with cerebral palsy. He was admitted with abdominal pain due to a SBO. He went for an ex lap which only showed a dilated small intestine without adhesions. He is currently on TPN while awaiting recovery of bowel function. 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 His INR  and warfarin were reversed upon admission for surgery. Separately, he was found to have a urinary obstruction, he was a difficult cath placement and had some meatal bleeding. This is now resolved. He was placed on a heparin infusion 11/20, to be continued until warfarin can be safely resumed.  11/27: PM heparin level still slightly below goal.  No known infusion with IV heparin infusion.  Goal of Therapy:  Heparin level 0.3-0.7 units/ml  Monitor platelets by anticoagulation protocol: Yes   Plan:  Increase IV heparin to 1550 units/hr. Heparin level in 6 hours Continue to monitor H&H and platelets, CBC and HL daily.  Thank you for involving pharmacy in this patient's care.  Marguerite Olea, [facility] Clinical Pharmacist Phone [phone]  12/24/2017 4:48 PM

## 2017-12-24 NOTE — Progress Notes (Signed)
Progress Note: General Surgery Service   Assessment/Plan: Principal Problem:   Small bowel obstruction (HCC) Active Problems:   Infantile cerebral palsy (HCC)   Atrial fibrillation (HCC)   Chronic constipation   Hypertension   Spasticity   Neurogenic bladder   Lactic acidosis   Anticoagulated   Respiratory failure (HCC)   Encephalopathy acute   Pressure injury of skin  s/p Procedure(s): EXPLORATORY LAPAROTOMY 12/15/2017 -clamp Ng tube today, possible removal in afternoon -swallow study today due to prolonged intubation    LOS: 10 days  Chief Complaint/Subjective: Pain controlled, +flatus, no nausea, no BM  Objective: Vital signs in last 24 hours: Temp:  [98.6 F (37 C)-101.3 F (38.5 C)] 98.6 F (37 C) (11/27 0700) Pulse Rate:  [85-226] 101 (11/27 0700) Resp:  [16-39] 24 (11/27 0700) BP: (93-144)/(57-97) 108/70 (11/27 0700) SpO2:  [79 %-100 %] 100 % (11/27 0700) FiO2 (%):  [40 %] 40 % (11/26 1116) Weight:  [91.9 kg] 91.9 kg (11/27 0415) Last BM Date: 12/15/17  Intake/Output from previous day: 11/26 0701 - 11/27 0700 In: 2070.1 [I.V.:1686.6; NG/GT:160; IV Piggyback:223.6] Out: 3500 [XFGHW:2993; Emesis/NG output:200] Intake/Output this shift: No intake/output data recorded.  Lungs: nonlabored breathing  Cardiovascular: tachycardic  Abd: soft, NT, distension improved, incision c/d/i  Extremities: no edema  Neuro: AOx3  Lab Results: CBC  Recent Labs    12/23/17 0502 12/24/17 0418  WBC 8.4 6.1  HGB 10.9* 10.4*  HCT 34.1* 33.6*  PLT 291 280   BMET Recent Labs    12/23/17 0502 12/24/17 0418  NA 137 141  K 3.9 3.9  CL 99 104  CO2 31 32  GLUCOSE 137* 121*  BUN 19 23*  CREATININE 0.77 0.78  CALCIUM 8.5* 8.7*   PT/INR No results for input(s): LABPROT, INR in the last 72 hours. ABG Recent Labs    12/24/17 0330  PHART 7.461*  HCO3 31.3*    Studies/Results:  Anti-infectives: Anti-infectives (From admission, onward)   Start      Dose/Rate Route Frequency Ordered Stop   12/23/17 1400  erythromycin 305 mg in sodium chloride 0.9 % 100 mL IVPB  Status:  Discontinued     3 mg/kg  101 kg 100 mL/hr over 60 Minutes Intravenous Every 8 hours 12/23/17 1128 12/23/17 1142   12/23/17 1400  erythromycin 300 mg in sodium chloride 0.9 % 100 mL IVPB     300 mg 100 mL/hr over 60 Minutes Intravenous Every 8 hours 12/23/17 1143     12/17/17 1600  cefTAZidime (FORTAZ) 1 g in sodium chloride 0.9 % 100 mL IVPB  Status:  Discontinued     1 g 200 mL/hr over 30 Minutes Intravenous Every 8 hours 12/17/17 0800 12/17/17 0815   12/17/17 0900  cefTAZidime (FORTAZ) 1 g in sodium chloride 0.9 % 100 mL IVPB     1 g 200 mL/hr over 30 Minutes Intravenous Every 8 hours 12/17/17 0815 12/23/17 0246   12/16/17 1400  cefTAZidime (FORTAZ) 2 g in sodium chloride 0.9 % 100 mL IVPB  Status:  Discontinued     2 g 200 mL/hr over 30 Minutes Intravenous Every 8 hours 12/16/17 1125 12/17/17 0800   12/15/17 0300  vancomycin (VANCOCIN) IVPB 1000 mg/200 mL premix  Status:  Discontinued     1,000 mg 200 mL/hr over 60 Minutes Intravenous Every 8 hours 12/14/17 1844 12/15/17 1133   12/14/17 1900  vancomycin (VANCOCIN) 1,750 mg in sodium chloride 0.9 % 500 mL IVPB     1,750 mg 250  mL/hr over 120 Minutes Intravenous  Once 12/14/17 1844 12/14/17 2343   12/14/17 1845  ceFEPIme (MAXIPIME) 2 g in sodium chloride 0.9 % 100 mL IVPB  Status:  Discontinued     2 g 200 mL/hr over 30 Minutes Intravenous Every 8 hours 12/14/17 1844 12/15/17 1133   12/14/17 1800  ceFEPIme (MAXIPIME) 2 g in sodium chloride 0.9 % 100 mL IVPB  Status:  Discontinued     2 g 200 mL/hr over 30 Minutes Intravenous  Once 12/14/17 1753 12/14/17 1844   12/14/17 1800  metroNIDAZOLE (FLAGYL) IVPB 500 mg  Status:  Discontinued     500 mg 100 mL/hr over 60 Minutes Intravenous Every 8 hours 12/14/17 1753 12/15/17 1133   12/14/17 1800  vancomycin (VANCOCIN) IVPB 1000 mg/200 mL premix  Status:  Discontinued       1,000 mg 200 mL/hr over 60 Minutes Intravenous  Once 12/14/17 1753 12/14/17 1844      Medications: Scheduled Meds: . baclofen  20 mg Per Tube QID  . Chlorhexidine Gluconate Cloth  6 each Topical Daily  . cyclobenzaprine  5 mg Per Tube BID   And  . cyclobenzaprine  10 mg Per Tube QHS  . diazepam  2 mg Intravenous BID  . furosemide  40 mg Intravenous Q8H  . liver oil-zinc oxide   Topical QID  . mouth rinse  15 mL Mouth Rinse BID  . metoCLOPramide (REGLAN) injection  10 mg Intravenous Q8H  . metoprolol tartrate  5 mg Intravenous Q12H  . sodium chloride flush  10-40 mL Intracatheter Q12H  . tiZANidine  2 mg Per Tube TID   Continuous Infusions: . sodium chloride 10 mL/hr at 12/22/17 1900  . sodium chloride 10 mL/hr at 12/20/17 1329  . dexmedetomidine (PRECEDEX) IV infusion 0.4 mcg/kg/hr (12/24/17 0600)  . erythromycin 100 mL/hr at 12/24/17 0600  . heparin 1,450 Units/hr (12/24/17 0145)  . midazolam (VERSED) infusion Stopped (12/23/17 1108)  . TPN ADULT (ION) 50 mL/hr at 12/24/17 0600   PRN Meds:.sodium chloride, acetaminophen, fentaNYL, haloperidol lactate, LORazepam, metoprolol tartrate, midazolam, naLOXone (NARCAN)  injection, ondansetron (ZOFRAN) IV, sodium chloride flush  Mickeal Skinner, MD Office number [phone] [facility]

## 2017-12-25 ENCOUNTER — Inpatient Hospital Stay: Payer: Self-pay

## 2017-12-25 LAB — COMPREHENSIVE METABOLIC PANEL
ALBUMIN: 2.8 g/dL — AB (ref 3.5–5.0)
ALT: 126 U/L — ABNORMAL HIGH (ref 0–44)
ANION GAP: 10 (ref 5–15)
AST: 96 U/L — ABNORMAL HIGH (ref 15–41)
Alkaline Phosphatase: 173 U/L — ABNORMAL HIGH (ref 38–126)
BUN: 25 mg/dL — ABNORMAL HIGH (ref 6–20)
CHLORIDE: 105 mmol/L (ref 98–111)
CO2: 27 mmol/L (ref 22–32)
Calcium: 8.8 mg/dL — ABNORMAL LOW (ref 8.9–10.3)
Creatinine, Ser: 0.86 mg/dL (ref 0.61–1.24)
GFR calc Af Amer: >60 mL/min (ref >60)
GFR calc non Af Amer: >60 mL/min (ref >60)
GLUCOSE: 108 mg/dL — AB (ref 70–99)
POTASSIUM: 4.2 mmol/L (ref 3.5–5.1)
SODIUM: 142 mmol/L (ref 135–145)
Total Bilirubin: 0.9 mg/dL (ref 0.3–1.2)
Total Protein: 6.1 g/dL — ABNORMAL LOW (ref 6.5–8.1)

## 2017-12-25 LAB — GLUCOSE, CAPILLARY
GLUCOSE-CAPILLARY: 110 mg/dL — AB (ref 70–99)
GLUCOSE-CAPILLARY: 115 mg/dL — AB (ref 70–99)
Glucose-Capillary: 103 mg/dL — ABNORMAL HIGH (ref 70–99)
Glucose-Capillary: 118 mg/dL — ABNORMAL HIGH (ref 70–99)
Glucose-Capillary: 126 mg/dL — ABNORMAL HIGH (ref 70–99)

## 2017-12-25 LAB — HEPARIN LEVEL (UNFRACTIONATED): Heparin Unfractionated: 0.49 IU/mL (ref 0.30–0.70)

## 2017-12-25 LAB — MAGNESIUM: Magnesium: 2 mg/dL (ref 1.7–2.4)

## 2017-12-25 LAB — PHOSPHORUS: Phosphorus: 3.7 mg/dL (ref 2.5–4.6)

## 2017-12-25 MED ORDER — TIZANIDINE HCL 2 MG PO TABS
2.0000 mg | ORAL_TABLET | Freq: Three times a day (TID) | ORAL | Status: DC
  Administered 2017-12-26 – 2017-12-28 (×6): 2 mg via ORAL
  Filled 2017-12-25 (×10): qty 1

## 2017-12-25 MED ORDER — METOCLOPRAMIDE HCL 10 MG/10ML PO SOLN
10.0000 mg | Freq: Once | ORAL | Status: AC | PRN
  Administered 2017-12-25: 10 mg via ORAL
  Filled 2017-12-25 (×2): qty 10

## 2017-12-25 MED ORDER — CYCLOBENZAPRINE HCL 10 MG PO TABS
5.0000 mg | ORAL_TABLET | Freq: Two times a day (BID) | ORAL | Status: DC
  Administered 2017-12-27 – 2017-12-28 (×4): 5 mg via ORAL
  Filled 2017-12-25 (×4): qty 1

## 2017-12-25 MED ORDER — BACLOFEN 20 MG PO TABS
20.0000 mg | ORAL_TABLET | Freq: Four times a day (QID) | ORAL | Status: DC
  Administered 2017-12-26 – 2017-12-28 (×8): 20 mg via ORAL
  Filled 2017-12-25 (×8): qty 1

## 2017-12-25 MED ORDER — METOPROLOL TARTRATE 5 MG/5ML IV SOLN
5.0000 mg | Freq: Four times a day (QID) | INTRAVENOUS | Status: DC
  Administered 2017-12-26 – 2017-12-27 (×6): 5 mg via INTRAVENOUS
  Filled 2017-12-25 (×7): qty 5

## 2017-12-25 MED ORDER — CYCLOBENZAPRINE HCL 10 MG PO TABS
10.0000 mg | ORAL_TABLET | Freq: Every day | ORAL | Status: DC
  Administered 2017-12-26 – 2017-12-27 (×2): 10 mg via ORAL
  Filled 2017-12-25 (×2): qty 1

## 2017-12-25 MED ORDER — TRACE MINERALS CR-CU-MN-SE-ZN 10-1000-500-60 MCG/ML IV SOLN
INTRAVENOUS | Status: DC
  Administered 2017-12-25: 17:00:00 via INTRAVENOUS
  Filled 2017-12-25: qty 744

## 2017-12-25 MED ORDER — ACETAMINOPHEN 325 MG PO TABS: 650.0000 mg | ORAL_TABLET | Freq: Four times a day (QID) | ORAL | Status: DC | PRN
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
## 2017-12-25 NOTE — Progress Notes (Signed)
[facility] Progress Note Patient Name: [name] DOB: 1980/10/02 MRN: [id number]   Date of Service  12/25/2017  HPI/Events of Note  Notified of persistent hiccups, positive bowel sounds but hypoactive, had BP  eICU Interventions  Will give a trial of metoclopromide     Intervention Category Evaluation Type: Kreg Shropshire 12/25/2017, 4:29 AM

## 2017-12-25 NOTE — Progress Notes (Signed)
Call placed to MD re: Nausea and constant hiccups, HR 110=130's and he had Metoprolol 5 mg IV at 12 AM, will give again soon. Pt has an N/G clamped at this time for meds, checked for placement and reinserted a little bit and retaped, will give Reglan per N/G tube x 1 as per MD and continue to monitor.

## 2017-12-25 NOTE — Progress Notes (Signed)
ANTICOAGULATION CONSULT NOTE - Follow Up Consult  Pharmacy Consult for heparin Indication: DVT  Allergies  Allergen Reactions  . Betadine [Povidone Iodine] Other (See Comments)    Unknown reaction per caregiver  . Ciprofloxacin Other (See Comments)    Unknown reaction per caregiver  . Morphine Other (See Comments)    Unknown reaction per caregiver    Patient Measurements: Height: 5\' 5"  (165.1 cm) Weight: 202 lb 9.6 oz (91.9 kg) IBW/kg (Calculated) : 61.5 Heparin Dosing Weight: 84 kg  Vital Signs: Temp: 98.3 F (36.8 C) (11/28 0732) Temp Source: Oral (11/28 0732) BP: 127/67 (11/28 0732) Pulse Rate: 129 (11/28 0732)  Labs: Recent Labs    12/23/17 0502 12/24/17 0418 12/24/17 0621 12/24/17 1500 12/24/17 1704  HGB 10.9* 10.4*  --   --   --   HCT 34.1* 33.6*  --   --   --   PLT 291 280  --   --   --   HEPARINUNFRC 0.33 <0.10* <0.10* 0.24*  --   CREATININE 0.77 0.78  --   --   --   CKTOTAL  --   --   --   --  95    Estimated Creatinine Clearance: 130.5 mL/min (by C-G formula based on SCr of 0.78 mg/dL).   Medications:  Scheduled:  . baclofen  20 mg Per Tube QID  . chlorhexidine  15 mL Mouth Rinse BID  . Chlorhexidine Gluconate Cloth  6 each Topical Daily  . cyclobenzaprine  5 mg Per Tube BID   And  . cyclobenzaprine  10 mg Per Tube QHS  . diazepam  2 mg Intravenous BID  . erythromycin  250 mg Oral TID  . liver oil-zinc oxide   Topical QID  . mouth rinse  15 mL Mouth Rinse BID  . mouth rinse  15 mL Mouth Rinse q12n4p  . mouth rinse  15 mL Mouth Rinse q12n4p  . metoprolol tartrate  5 mg Intravenous Q12H  . sodium chloride flush  10-40 mL Intracatheter Q12H  . tiZANidine  2 mg Per Tube TID    Assessment: 37 year old male with cerebral palsy. He was admitted with abdominal pain due to a SBO. He went for an ex lap which only showed a dilated small intestine without adhesions. He is currently on TPN while awaiting recovery of bowel function. 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 His INR and warfarin were reversed upon admission for surgery. He remains on heparin while coumadin on hold -heparin level= 0.24   Goal of Therapy:  Heparin level 0.3-0.7 units/ml  Monitor platelets by anticoagulation protocol: Yes   Plan:  -increase heparin to 1700 units/hr -Heparin level in 6 hours and daily wth CBC daily  Hildred Laser, PharmD Clinical Pharmacist **Pharmacist phone directory can now be found on [email or web address] ([email or web address]).  Listed under [place].

## 2017-12-25 NOTE — Progress Notes (Signed)
[facility] TEAM 1 - Stepdown/ICU TEAM  Courvoisier Hamblen  YCX:[id number] DOB: 21-Aug-1980 DOA: 12/14/2017 PCP: [name], MD    Brief Narrative:  37yo male with a hx of Cerebral palsy, DVT on coumadin, HTN, chronic constipation, and skin ulceration w/ breakdown who originally presented with nausea vomiting and abdominal pain of acute onset on 12/14/2017.  Evaluation in the ED revealed a distal small bowel obstruction and large toe burden as noted on CT abdomen/pelvis.  Significant Events: 11/17 - admit to [facility] 11/18 - Exploratory Laparotomy with lysis of adhesions 11/18 - Desaturation into 50s and initially unresponsive, now intubated and sedated 11/18 - Central Line placed, TPN started 11/19 - Foley placed requiring Urology 11/19 - UCx grew >100k Pseudomonas Aerg. 11/19 - Bil. Duplex U/S of LE neg for DVTs 11/20 - EEG: slowly most likely due to sedating medications; no signs of seizure activity 11/26 - extubated  11/28 - [facility] assumed care   Subjective: Resting comfortably. No new complaints. Wants to eat.  Assessment & Plan:  Acute Hypoxic Respiratory Failure multifactorial due to narcotics, aspiration, and post surgical respiratory depression - extubated on 11/26 - resolved  Small Bowel Obstruction s/p Exploratory Laparotomy General Surgery directing care - NG removed today   Dysphagia Likely due to prolonged intubation - SLP following - not yet safe to swallow  Pseudomonas UTI Has completed a course of IV abx   Cerebral Palsy with chronic spasticity cont home meds per tube: baclofen, flexeril, oral valium, zanaflex  DVT on chronic coumadin Heparin drip per Pharm  HTN  Paroxymsal Atrial Fibrilation Rate not at goal - adjust tx and follow  DVT prophylaxis: heparin Code Status: FULL CODE Family Communication: no family present at time of exam  Disposition Plan: stable for tele bed  Consultants:  Gen Surgery  PCCM  Antimicrobials:  Ceftazidime 11/20  > 11/25  Objective: Blood pressure 124/73, pulse (!) 117, temperature 98.7 F (37.1 C), temperature source Oral, resp. rate 20, height 5\' 5"  (1.651 m), weight 91.9 kg, SpO2 95 %.  Intake/Output Summary (Last 24 hours) at 12/25/2017 1818 Last data filed at 12/25/2017 1703 Gross per 24 hour  Intake 787.49 ml  Output 2600 ml  Net -1812.51 ml   Filed Weights   12/17/17 0500 12/23/17 2300 12/24/17 0415  Weight: 101 kg 91.9 kg 91.9 kg    Examination: General: No acute respiratory distress Lungs: Clear to auscultation bilaterally without wheezes or crackles Cardiovascular: tachycardic - no M or rub Abdomen: Nontender, nondistended, soft, bowel sounds positive Extremities: No significant cyanosis, clubbing, or edema bilateral lower extremities  CBC: Recent Labs  Lab 12/22/17 0506 12/23/17 0502 12/24/17 0418  WBC 5.9 8.4 6.1  NEUTROABS 3.9  --   --   HGB 10.4* 10.9* 10.4*  HCT 33.2* 34.1* 33.6*  MCV 90.2 91.4 92.3  PLT 270 291 497   Basic Metabolic Panel: Recent Labs  Lab 12/23/17 0502 12/24/17 0418 12/25/17 0917  NA 137 141 142  K 3.9 3.9 4.2  CL 99 104 105  CO2 31 32 27  GLUCOSE 137* 121* 108*  BUN 19 23* 25*  CREATININE 0.77 0.78 0.86  CALCIUM 8.5* 8.7* 8.8*  MG 2.0 2.1 2.0  PHOS 4.1 3.9 3.7   GFR: Estimated Creatinine Clearance: 121.4 mL/min (by C-G formula based on SCr of 0.86 mg/dL).  Liver Function Tests: Recent Labs  Lab 12/22/17 0506 12/24/17 0418 12/25/17 0917  AST 27 146* 96*  ALT 23 102* 126*  ALKPHOS 53 198* 173*  BILITOT 0.4 2.6* 0.9  PROT 5.1* 5.6* 6.1*  ALBUMIN 2.2* 2.4* 2.8*    Cardiac Enzymes: Recent Labs  Lab 12/24/17 1704  CKTOTAL 95    CBG: Recent Labs  Lab 12/24/17 1622 12/24/17 2104 12/25/17 0234 12/25/17 0555 12/25/17 0731  GLUCAP 80 93 115* 103* 118*    Scheduled Meds: . baclofen  20 mg Per Tube QID  . chlorhexidine  15 mL Mouth Rinse BID  . Chlorhexidine Gluconate Cloth  6 each Topical Daily  .  cyclobenzaprine  5 mg Per Tube BID   And  . cyclobenzaprine  10 mg Per Tube QHS  . diazepam  2 mg Intravenous BID  . erythromycin  250 mg Oral TID  . liver oil-zinc oxide   Topical QID  . mouth rinse  15 mL Mouth Rinse BID  . mouth rinse  15 mL Mouth Rinse q12n4p  . mouth rinse  15 mL Mouth Rinse q12n4p  . metoprolol tartrate  5 mg Intravenous Q12H  . sodium chloride flush  10-40 mL Intracatheter Q12H  . tiZANidine  2 mg Per Tube TID   Continuous Infusions: . sodium chloride 10 mL/hr at 12/22/17 1900  . heparin 1,700 Units/hr (12/25/17 0900)  . TPN ADULT (ION) 75 mL/hr at 12/25/17 1721     LOS: 11 days   Cherene Altes, MD [facility] Office  [phone] Pager - Text Page per Amion  If 7PM-7AM, please contact night-coverage per Amion 12/25/2017, 6:18 PM

## 2017-12-25 NOTE — Progress Notes (Signed)
Pt had a large liquid BM and during cleaning him up had projectile vomiting, hooked N/G up to low wall suction and he had 300cc green liquid bowl came out, suctioned mouth and did mouthcare, lung sounds remain fairly clear, will continue to monitor.

## 2017-12-25 NOTE — Progress Notes (Signed)
ANTICOAGULATION CONSULT NOTE - Follow Up Consult  Pharmacy Consult for heparin Indication: DVT  Allergies  Allergen Reactions  . Betadine [Povidone Iodine] Other (See Comments)    Unknown reaction per caregiver  . Ciprofloxacin Other (See Comments)    Unknown reaction per caregiver  . Morphine Other (See Comments)    Unknown reaction per caregiver    Patient Measurements: Height: 5\' 5"  (165.1 cm) Weight: 202 lb 9.6 oz (91.9 kg) IBW/kg (Calculated) : 61.5 Heparin Dosing Weight: 84 kg  Vital Signs: Temp: 98.7 F (37.1 C) (11/28 1703) Temp Source: Oral (11/28 1703) BP: 124/73 (11/28 1703) Pulse Rate: 117 (11/28 1703)  Labs: Recent Labs    12/23/17 0502 12/24/17 0418 12/24/17 0621 12/24/17 1500 12/24/17 1704 12/25/17 0917 12/25/17 1500  HGB 10.9* 10.4*  --   --   --   --   --   HCT 34.1* 33.6*  --   --   --   --   --   PLT 291 280  --   --   --   --   --   HEPARINUNFRC 0.33 <0.10* <0.10* 0.24*  --   --  0.49  CREATININE 0.77 0.78  --   --   --  0.86  --   CKTOTAL  --   --   --   --  95  --   --     Estimated Creatinine Clearance: 121.4 mL/min (by C-G formula based on SCr of 0.86 mg/dL).   Medications:  Scheduled:  . baclofen  20 mg Per Tube QID  . chlorhexidine  15 mL Mouth Rinse BID  . Chlorhexidine Gluconate Cloth  6 each Topical Daily  . cyclobenzaprine  5 mg Per Tube BID   And  . cyclobenzaprine  10 mg Per Tube QHS  . diazepam  2 mg Intravenous BID  . erythromycin  250 mg Oral TID  . liver oil-zinc oxide   Topical QID  . mouth rinse  15 mL Mouth Rinse BID  . mouth rinse  15 mL Mouth Rinse q12n4p  . mouth rinse  15 mL Mouth Rinse q12n4p  . metoprolol tartrate  5 mg Intravenous Q12H  . sodium chloride flush  10-40 mL Intracatheter Q12H  . tiZANidine  2 mg Per Tube TID    Assessment: 37 year old male with cerebral palsy. He was admitted with abdominal pain due to a SBO. He went for an ex-lap, which only showed a dilated small intestine without  adhesions. He is currently on TPN while awaiting recovery of bowel function. 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 His INR and warfarin were reversed upon admission for surgery. He remains on heparin while coumadin on hold.  -heparin level therapeutic at 0.49 after rate increases. CBC stable - none drawn today. No active bleed issues documented.   Goal of Therapy:  Heparin level 0.3-0.7 units/ml  Monitor platelets by anticoagulation protocol: Yes   Plan:  Continue heparin at 1700 units/hr Confirmatory heparin level with AM labs Monitor daily heparin level and CBC, s/sx bleeding F/u plans to resume warfarin as appropriate  Elicia Lamp, PharmD, BCPS Please check AMION for all [place] contact numbers Clinical Pharmacist 12/25/2017 5:35 PM

## 2017-12-25 NOTE — Progress Notes (Signed)
PHARMACY - ADULT TOTAL PARENTERAL NUTRITION CONSULT NOTE   Pharmacy Consult for TPN Indication: Nutrition s/p surgery for small bowel obstruction  Patient Measurements: Height: 5' 5" (165.1 cm) Weight: 202 lb 9.6 oz (91.9 kg) IBW/kg (Calculated) : 61.5  AdjBW = 68.8 kg   Body mass index is 33.71 kg/m. Usual Weight: 90.7 kg  Assessment:  Patient admitted 11/17 by ED p/w abdominal pain, N/V, lactic acidosis to 6, WBC 13, CT scan supportive of SBO. Pt required immediate surgery. 11/18 0208 s/p surgery for SBO; found diffusely dilated small intestine without transition point, small amount of adhesions, no ischemia. Patient extubated in OR, transferred to 61M,  intubated due to insufficient oxygenation at 0918. TPN started because historically patient has had long post-op ileus. 11/27 - failed swallow exam - recommendation is to continue NPO status for now.  RD suggesting consideration of NGT with Jevity.  GI: s.p ExLap with LoA on 11/18; Pre-albumin 11.7>10.6. No BM yet, +flatus. NG o/p 200 cc/24h, NGT clamping today, may remove this afternoon - may start TF in next day or two if tolerates. Start reglan x 5 days 11/25, erythromycin 300 IV q8h -Vomiting night of 11/25 x 1 - no further episodes Endo: No h/o DM, BG <180. No SSI Insulin requirements in the past 24 hours: 0 Lytes: No new labs today but has been stable as of 11/27 with K+ = 3.9 (S/p lasix + KCl), and others WNL Renal: Renal function is stable with good UOP - 0.6 mL/kg/hr now off lasix 40 IV q8h. +4.5 L since admission  Pulm: Extubated 11/26 > 2L Impact Cards: MAP 50-80s, HR elevated this AM 120's Hepatobil: LFTs and alk phos now elevated, Tbili 2.6, TG 96>140 Neuro: Change from fent>versed _0 , Dex _1 .4, baseline cerebal palsy, requires chronic antispasmodics. Intermittent agitation. Oral meds for spasms resumed in PO trial ID: Tmax/24h 101.3 - now afebrile, WBC wnl, S/p ceftazidime for pseudomonas UTI 11/20-11/25 Heme: Pt on  warfarin PTA for hx DVTs- currently on hold - on IV Hep. CBC stable  TPN Access: PICC 11/18>> TPN start date: 11/18 Nutritional Goals (per RD assessment on 11/27): Kcal:  1900-2100 Protein:  100-115gm Fluid:  2 L  Current Nutrition:  TPN @ 50 mL/hr NPO  Plan:  - Adjust TPN to infuse at 75 mL/hr and continue Lipids at current rate. Will adjust protein content to provide new goals as noted by RD on 11/27.  This TPN provides 111 g of protein,  306 g of dextrose, and 54 g of lipids which provides 2026 kCals per day, meeting 100% of patient needs. Electrolytes in TPN: Cl:Ac 1:2, Change potassium to 41mq/L and sodium to 40 meq/L with discontinuation of IV Lasix.  Will continue remaining electrolytes and follow up morning labs. Continue MVI, trace elements, and famotidine 481madded to TPN  No insulin requirements on goal TPN - monitor CBGs Monitor TPN labs, toleration of NGT clamping trial and ability to start TF   NiRober MinionPharmD., MS Clinical Pharmacist Pager:  [phone] you for allowing pharmacy to be part of this patients care team.  12/25/2017 7:35 AM  **Pharmacist phone directory can now be found on [facility] listed under [facility]

## 2017-12-25 NOTE — Progress Notes (Signed)
MD aware PICC will be done tomorrow.

## 2017-12-25 NOTE — Evaluation (Signed)
Physical Therapy Evaluation Patient Details Name: [name] MRN: [id number] DOB: 09-25-1980 Today's Date: 12/25/2017   History of Present Illness  Pt is a 37 y.o. M presenting with SBO s/p ex lap 11/18 and acute respiratory failure requiring ETT 11/18-11/26. PMH of mixed cerebral palsy, DVT on coumadin, HTN, and chronic constipation  Clinical Impression  Patient admitted with above diagnosis. At baseline, patient is dependent for transfers using two person total assistance or lift. He mobilizes independently using power wheelchair adapted with a seatbelt and head control adaptation. Patient has 24/7 assistance via two caregivers and is dependent for ADL's. Evaluation focused on PROM of all extremities, repositioning, and education on cervical AROM and isometric strengthening for preparing for return to driving power wheelchair. Recommended air bed to RN for pressure relief. No further acute PT needs at this time. PT signing off.      Follow Up Recommendations No PT follow up;Supervision/Assistance - 24 hour    Equipment Recommendations  None recommended by PT    Recommendations for Other Services       Precautions / Restrictions Precautions Precautions: Fall Precaution Comments: BUE spasticity  Restrictions Weight Bearing Restrictions: No      Mobility  Bed Mobility               General bed mobility comments: deferred  Transfers                 General transfer comment: deferred  Ambulation/Gait             General Gait Details: non-ambulatory  Stairs            Wheelchair Mobility    Modified Rankin (Stroke Patients Only)       Balance                                             Pertinent Vitals/Pain Pain Assessment: No/denies pain    Home Living Family/patient expects to be discharged to:: Private residence Living Arrangements: Other (Comment)(Caregiver) Available Help at Discharge: Personal care  attendant;Available 24 hours/day   Home Access: Level entry     Home Layout: One level Home Equipment: Wheelchair - power;Hospital bed;Other (comment);Shower seat(Hoyer lift) Additional Comments: Pt has 2 caregivers and 24/7 care. Patient resides in private residence through [facility] [employer] and participates in the day program.    Prior Function Level of Independence: Needs assistance   Gait / Transfers Assistance Needed: Pt mobilizes independently using a head control power wheelchair. Wheelchair adapted with seatbelt, adductors, and arm restraints. Requires "two person total assist," in and out of wheelchair but does have a hoyer lift that they use occasionally as well.  ADL's / Homemaking Assistance Needed: requires assist for all ADL's including feeding  Comments: Uses [employer] for transportation     Hand Dominance        Extremity/Trunk Assessment   Upper Extremity Assessment Upper Extremity Assessment: RUE deficits/detail;LUE deficits/detail RUE Deficits / Details: Spastic, extensor posturing. PROM WFL RUE Coordination: decreased fine motor;decreased gross motor LUE Deficits / Details: Spastic, extensor posturing. PROM WFL LUE Coordination: decreased fine motor;decreased gross motor    Lower Extremity Assessment Lower Extremity Assessment: RLE deficits/detail;LLE deficits/detail RLE Deficits / Details: Rests in foot inversion (baseline) and otherwise PROM WFL LLE Deficits / Details: Increased edema. PROM WFL       Communication   Communication: Expressive difficulties((baseline  due to CP))  Cognition Arousal/Alertness: Awake/alert Behavior During Therapy: WFL for tasks assessed/performed Overall Cognitive Status: History of cognitive impairments - at baseline                                 General Comments: Pt at baseline cognition based on pt parents report. He is able to state one word, basic answers and occasionally spells out longer words        General Comments      Exercises Other Exercises Other Exercises: PROM bilateral ankle dorsiflexion, hip/knee flexion, hip abduction/adduction, finger flexion/extension, elbow flexion/extension, shoulder flexion Other Exercises: AROM cervical rotation to left and right, isometric chin tucks x 5 with 5 s holds    Assessment/Plan    PT Assessment Patent does not need any further PT services  PT Problem List         PT Treatment Interventions      PT Goals (Current goals can be found in the Care Plan section)  Acute Rehab PT Goals Patient Stated Goal: none stated by pt; pt parents would like air bed PT Goal Formulation: All assessment and education complete, DC therapy    Frequency     Barriers to discharge        Co-evaluation               AM-PAC PT "6 Clicks" Mobility  Outcome Measure Help needed turning from your back to your side while in a flat bed without using bedrails?: Total Help needed moving from lying on your back to sitting on the side of a flat bed without using bedrails?: Total Help needed moving to and from a bed to a chair (including a wheelchair)?: Total Help needed standing up from a chair using your arms (e.g., wheelchair or bedside chair)?: Total Help needed to walk in hospital room?: Total Help needed climbing 3-5 steps with a railing? : Total 6 Click Score: 6    End of Session   Activity Tolerance: Patient tolerated treatment well Patient left: in bed;with call bell/phone within reach;with nursing/sitter in room Nurse Communication: Other (comment)(request for air bed) PT Visit Diagnosis: Other abnormalities of gait and mobility (R26.89)    Time: 7829-5621 PT Time Calculation (min) (ACUTE ONLY): 32 min   Charges:   PT Evaluation $PT Eval Moderate Complexity: 1 Mod PT Treatments $Therapeutic Exercise: 8-22 mins       Ellamae Sia, PT, DPT Acute Rehabilitation Services Pager [phone] Office [phone]  Willy Eddy 12/25/2017, 1:37 PM

## 2017-12-26 DIAGNOSIS — K5909 Other constipation: Secondary | ICD-10-CM

## 2017-12-26 DIAGNOSIS — I4891 Unspecified atrial fibrillation: Secondary | ICD-10-CM

## 2017-12-26 DIAGNOSIS — I10 Essential (primary) hypertension: Secondary | ICD-10-CM

## 2017-12-26 DIAGNOSIS — Z7901 Long term (current) use of anticoagulants: Secondary | ICD-10-CM

## 2017-12-26 DIAGNOSIS — E872 Acidosis: Secondary | ICD-10-CM

## 2017-12-26 DIAGNOSIS — G809 Cerebral palsy, unspecified: Secondary | ICD-10-CM

## 2017-12-26 LAB — GLUCOSE, CAPILLARY
GLUCOSE-CAPILLARY: 111 mg/dL — AB (ref 70–99)
Glucose-Capillary: 107 mg/dL — ABNORMAL HIGH (ref 70–99)
Glucose-Capillary: 116 mg/dL — ABNORMAL HIGH (ref 70–99)
Glucose-Capillary: 115 mg/dL — ABNORMAL HIGH (ref 70–99)
Glucose-Capillary: 105 mg/dL — ABNORMAL HIGH (ref 70–99)

## 2017-12-26 LAB — CBC
HCT: 37.5 % — ABNORMAL LOW (ref 39.0–52.0)
Hemoglobin: 11 g/dL — ABNORMAL LOW (ref 13.0–17.0)
MCH: 28 pg (ref 26.0–34.0)
MCHC: 29.3 g/dL — ABNORMAL LOW (ref 30.0–36.0)
MCV: 95.4 fL (ref 80.0–100.0)
Platelets: 414 10*3/uL — ABNORMAL HIGH (ref 150–400)
RBC: 3.93 MIL/uL — ABNORMAL LOW (ref 4.22–5.81)
RDW: 14.6 % (ref 11.5–15.5)
WBC: 8.9 10*3/uL (ref 4.0–10.5)
nRBC: 0 % (ref 0.0–0.2)

## 2017-12-26 LAB — BASIC METABOLIC PANEL
Anion gap: 7 (ref 5–15)
BUN: 22 mg/dL — ABNORMAL HIGH (ref 6–20)
CO2: 27 mmol/L (ref 22–32)
CREATININE: 0.85 mg/dL (ref 0.61–1.24)
Calcium: 9 mg/dL (ref 8.9–10.3)
Chloride: 111 mmol/L (ref 98–111)
GFR calc Af Amer: >60 mL/min (ref >60)
GFR calc non Af Amer: >60 mL/min (ref >60)
Glucose, Bld: 115 mg/dL — ABNORMAL HIGH (ref 70–99)
Potassium: 4.3 mmol/L (ref 3.5–5.1)
Sodium: 145 mmol/L (ref 135–145)

## 2017-12-26 LAB — HEPARIN LEVEL (UNFRACTIONATED): Heparin Unfractionated: 0.63 IU/mL (ref 0.30–0.70)

## 2017-12-26 MED ORDER — TRACE MINERALS CR-CU-MN-SE-ZN 10-1000-500-60 MCG/ML IV SOLN
INTRAVENOUS | Status: AC
  Administered 2017-12-26: 18:00:00 via INTRAVENOUS
  Filled 2017-12-26: qty 744

## 2017-12-26 MED ORDER — SODIUM CHLORIDE 0.9% FLUSH
10.0000 mL | Freq: Two times a day (BID) | INTRAVENOUS | Status: DC
  Administered 2017-12-26 – 2017-12-27 (×3): 10 mL
  Administered 2017-12-27: 30 mL
  Administered 2017-12-28: 10 mL

## 2017-12-26 MED ORDER — SODIUM CHLORIDE 0.9% FLUSH: 10.0000 mL | INTRAVENOUS | Status: DC | PRN

## 2017-12-26 NOTE — Evaluation (Signed)
Occupational Therapy Evaluation Patient Details Name: [name] MRN: [id number] DOB: 06-Sep-1980 Today's Date: 12/26/2017    History of Present Illness Pt is a 37 y.o. M presenting with SBO s/p ex lap 11/18 and acute respiratory failure requiring ETT 11/18-11/26. PMH of mixed cerebral palsy, DVT on coumadin, HTN, and chronic constipation   Clinical Impression   Pt is dependent at baseline in transfers and ADL/IADL. He is able to communicate his needs verbally or by spelling. Called his caregiver, [name], at pt's request to bring his eye glasses and phone. Requested unit secretary order soft touch call button for pt to activate with head movement. No further OT needs.    Follow Up Recommendations  No OT follow up    Equipment Recommendations  None recommended by OT    Recommendations for Other Services       Precautions / Restrictions Precautions Precautions: Fall Precaution Comments: athetoid CP Restrictions Weight Bearing Restrictions: No      Mobility Bed Mobility Overal bed mobility: Needs Assistance Bed Mobility: Rolling Rolling: Max assist         General bed mobility comments: for ADL  Transfers                 General transfer comment: pt declined OOB    Balance                                           ADL either performed or assessed with clinical judgement   ADL Overall ADL's : At baseline                                             Vision Baseline Vision/History: Wears glasses Wears Glasses: At all times Patient Visual Report: No change from baseline Additional Comments: called caregiver [name] to bring glasses     Perception     Praxis      Pertinent Vitals/Pain Pain Assessment: No/denies pain     Hand Dominance Right   Extremity/Trunk Assessment Upper Extremity Assessment Upper Extremity Assessment: RUE deficits/detail;LUE deficits/detail RUE Deficits / Details: athetoid  movements, no functional use RUE Coordination: decreased fine motor;decreased gross motor LUE Deficits / Details: athetoid movement, no functional use LUE Coordination: decreased fine motor;decreased gross motor   Lower Extremity Assessment Lower Extremity Assessment: Defer to PT evaluation       Communication Communication Communication: Expressive difficulties(can spell if listener does not understand speech)   Cognition Arousal/Alertness: Awake/alert Behavior During Therapy: WFL for tasks assessed/performed Overall Cognitive Status: Within Functional Limits for tasks assessed                                 General Comments: pt able to state his phong number, that he wanted his eyeglasses and phone to be brought from home, identified what channel he wanted on tv   General Comments       Exercises     Shoulder Instructions      Home Living Family/patient expects to be discharged to:: Private residence Living Arrangements: Other (Comment)(24 hour caregivers) Available Help at Discharge: Personal care attendant;Available 24 hours/day   Home Access: Level entry     Home Layout: One level  Bathroom Shower/Tub: Other (comment)(roll in shower)   Bathroom Toilet: Handicapped height Bathroom Accessibility: Yes How Accessible: Accessible via wheelchair Home Equipment: Wheelchair - power;Hospital bed;Other (comment);Shower seat(lift equipment)   Additional Comments: Pt has 2 caregivers and 24/7 care. Patient resides in private residence through [facility] [employer] and participates in the day program.      Prior Functioning/Environment Level of Independence: Needs assistance  Gait / Transfers Assistance Needed: Pt mobilizes independently using a head control power wheelchair. Wheelchair adapted with seatbelt, adductors, and arm restraints. Requires "two person total assist," in and out of wheelchair but does have a hoyer lift that they use occasionally as  well. ADL's / Homemaking Assistance Needed: requires assist for all ADL's including feeding   Comments: Uses [employer] for transportation        OT Problem List:        OT Treatment/Interventions:      OT Goals(Current goals can be found in the care plan section) Acute Rehab OT Goals Patient Stated Goal: to get his glasses and phone  OT Frequency:     Barriers to D/C:            Co-evaluation              AM-PAC OT "6 Clicks" Daily Activity     Outcome Measure Help from another person eating meals?: Total Help from another person taking care of personal grooming?: Total Help from another person toileting, which includes using toliet, bedpan, or urinal?: Total Help from another person bathing (including washing, rinsing, drying)?: Total Help from another person to put on and taking off regular upper body clothing?: Total Help from another person to put on and taking off regular lower body clothing?: Total 6 Click Score: 6   End of Session Nurse Communication: Other (comment)(requested soft touch call button to be activated by head)  Activity Tolerance: Patient tolerated treatment well Patient left: in bed;with bed alarm set  OT Visit Diagnosis: Muscle weakness (generalized) (M62.81)                Time: 1010-1055 OT Time Calculation (min): 45 min Charges:  OT General Charges $OT Visit: 1 Visit OT Evaluation $OT Eval Moderate Complexity: 1 Mod OT Treatments $Therapeutic Activity: 23-37 mins  Nestor Lewandowsky, OTR/L Acute Rehabilitation Services Pager: [phone] Office: [phone]  [name] 12/26/2017, 1:07 PM

## 2017-12-26 NOTE — Progress Notes (Signed)
Peripherally Inserted Central Catheter/Midline Placement  The IV Nurse has discussed with the patient and/or persons authorized to consent for the patient, the purpose of this procedure and the potential benefits and risks involved with this procedure.  The benefits include less needle sticks, lab draws from the catheter, and the patient may be discharged home with the catheter. Risks include, but not limited to, infection, bleeding, blood clot (thrombus formation), and puncture of an artery; nerve damage and irregular heartbeat and possibility to perform a PICC exchange if needed/ordered by physician.  Alternatives to this procedure were also discussed.  Bard Power PICC patient education guide, fact sheet on infection prevention and patient information card has been provided to patient /or left at bedside.    PICC/Midline Placement Documentation  PICC Double Lumen 12/26/17 PICC Right Brachial 38 cm 0 cm (Active)  Indication for Insertion or Continuance of Line Administration of hyperosmolar/irritating solutions (i.e. TPN, Vancomycin, etc.) 12/26/2017  2:11 PM  Exposed Catheter (cm) 0 cm 12/26/2017  2:11 PM  Site Assessment Clean;Dry;Intact 12/26/2017  2:11 PM  Lumen #1 Status Flushed;Saline locked;Blood return noted 12/26/2017  2:11 PM  Lumen #2 Status Flushed;Saline locked;Blood return noted 12/26/2017  2:11 PM  Dressing Type Transparent;Securing device 12/26/2017  2:11 PM  Dressing Status Clean;Dry;Intact;Antimicrobial disc in place 12/26/2017  2:11 PM  Dressing Change Due 01/02/18 12/26/2017  2:11 PM       [name] 12/26/2017, 2:14 PM

## 2017-12-26 NOTE — Progress Notes (Signed)
PROGRESS NOTE  [name] VEL:[id number] DOB: [birth year] DOA: 12/14/2017 PCP: [name], MD   LOS: 12 days   Brief narrative:  Patient is a 37 years old male with past medical history of cerebral palsy, DVT on Coumadin, hypertension, chronic constipation, currently living at home with caregiver, presented to the hospital with complaints of nausea vomiting and abdominal pain for 1 day.  CT scan done in the ED showed small bowel obstruction.  There was some concern for ischemic bowel given the high lactate despite IV fluid hydration so surgery was consulted.  Patient was then admitted to the ICU on 12/14/2017.  Patient underwent laparotomy on 12/15/2017 but was found to have diffusely dilated small bowel without transition point and a small amount of adhesions but no bowel ischemia.  Patient was then extubated in the OR and transferred to ICU.  Patient had a prolonged postop ileus so TPN was initiated.  Patient has a failed swallow evaluation as per the evaluation on 11/27.    Significant Events: 11/17 - admit to [facility] 11/18 - Exploratory Laparotomy with lysis of adhesions 11/18 - Desaturation into 50s and initially unresponsive, now intubated and sedated 11/18 - Central Line placed, TPN started 11/19 - Foley placed requiring Urology 11/19 - UCx grew >100k Pseudomonas Aerg. 11/19 - Bil. Duplex U/S of LE neg for DVTs 11/20 - EEG: slowly most likely due to sedating medications; no signs of seizure activity 11/26 - extubated  11/28 - [facility] assumed care   Assessment/Plan:  Principal Problem:   Small bowel obstruction (HCC) Active Problems:   Infantile cerebral palsy (HCC)   Atrial fibrillation (HCC)   Chronic constipation   Hypertension   Spasticity   Neurogenic bladder   Lactic acidosis   Anticoagulated   Respiratory failure (HCC)   Encephalopathy acute   Pressure injury of skin  Small bowel obstruction likely ileus.  Patient has had prolonged postop ileus and is receiving  TPN.  Surgery on board.  Consider swallow evaluation again to see if the patient would be safe for oral intake.  Spoke with charge nurse  Cerebral palsy, neurogenic bladder spasticity.  Continue supportive care.  Continue on diazepam Flexeril tizanidine  Dysphagia likely secondary to prolonged intubation.  Speech therapy on board.  Pseudomonas UTI.  Patient has completed a course of IV antibiotic.  Hypertension.  Continue to monitor closely.  On metoprolol IV every 6H.  History of DVT on Coumadin at home.  Currently on heparin drip due to lack of oral route.  History of paroxysmal atrial fibrillation.  On heparin drip.  Elevated LFTs.  Will trend.   Code Status:  Full code  Family Communication: No one available at bedside.  Disposition Plan: With home health services   Consultants:  Surgery, PCCM  Procedures:    laparotomy on 12/15/2017  Antibiotics:  None now, Ceftazidime 11/20 > 11/25  Subjective: Patient states that he wants to eat.  He denies any pain nausea vomiting.  Objective: Vitals:   12/26/17 0645 12/26/17 0757  BP: 123/74 115/71  Pulse: (!) 109 98  Resp: (!) 26 (!) 29  Temp: 98.8 F (37.1 C) 98.7 F (37.1 C)  SpO2: 100% 100%    Intake/Output Summary (Last 24 hours) at 12/26/2017 1124 Last data filed at 12/26/2017 0646 Gross per 24 hour  Intake 789 ml  Output 2475 ml  Net -1686 ml   Filed Weights   12/17/17 0500 12/23/17 2300 12/24/17 0415  Weight: 101 kg 91.9 kg 91.9 kg  Physical Examination: General exam: Appears to be agitated at times, anxious.  History of cerebral palsy. HEENT:PERRL,Oral mucosa moist.  Internal jugular central line in place. Respiratory system: Bilateral equal air entry, normal vesicular breath sounds, no wheezes or crackles  Cardiovascular system: S1 & S2 heard, RRR.  Gastrointestinal system: Abdomen is nondistended, soft and nontender. No organomegaly or masses felt. Normal bowel sounds heard.  Surgical incision  site appears healthy without any induration erythema or tenderness Central nervous system: Communicative.  Alert or awake.  Agitated at times. Extremities: Spasticity noted over the extremities.. Skin: No rashes, lesions or ulcers,no icterus ,no pallor  Data Review: I have personally reviewed the following laboratory data and studies,  CBC: Recent Labs  Lab 12/21/17 0437 12/22/17 0506 12/23/17 0502 12/24/17 0418 12/26/17 0623  WBC 4.7 5.9 8.4 6.1 8.9  NEUTROABS  --  3.9  --   --   --   HGB 10.2* 10.4* 10.9* 10.4* 11.0*  HCT 32.2* 33.2* 34.1* 33.6* 37.5*  MCV 90.7 90.2 91.4 92.3 95.4  PLT 217 270 291 280 287*   Basic Metabolic Panel: Recent Labs  Lab 12/21/17 0437 12/22/17 0506 12/23/17 0502 12/24/17 0418 12/25/17 0917 12/26/17 0623  NA 135 135 137 141 142 145  K 3.3* 3.9 3.9 3.9 4.2 4.3  CL 101 100 99 104 105 111  CO2 29 30 31  32 27 27  GLUCOSE 132* 142* 137* 121* 108* 115*  BUN 14 15 19  23* 25* 22*  CREATININE 0.65 0.67 0.77 0.78 0.86 0.85  CALCIUM 7.7* 8.0* 8.5* 8.7* 8.8* 9.0  MG 1.9 2.1 2.0 2.1 2.0  --   PHOS 2.9 3.1 4.1 3.9 3.7  --    Liver Function Tests: Recent Labs  Lab 12/22/17 0506 12/24/17 0418 12/25/17 0917  AST 27 146* 96*  ALT 23 102* 126*  ALKPHOS 53 198* 173*  BILITOT 0.4 2.6* 0.9  PROT 5.1* 5.6* 6.1*  ALBUMIN 2.2* 2.4* 2.8*   No results for input(s): LIPASE, AMYLASE in the last 168 hours. No results for input(s): AMMONIA in the last 168 hours. Cardiac Enzymes: Recent Labs  Lab 12/24/17 1704  CKTOTAL 95   BNP (last 3 results) No results for input(s): BNP in the last 8760 hours.  ProBNP (last 3 results) No results for input(s): PROBNP in the last 8760 hours.  CBG: Recent Labs  Lab 12/25/17 0731 12/25/17 1942 12/25/17 2342 12/26/17 0605 12/26/17 0756  GLUCAP 118* 126* 110* 107* 111*   No results found for this or any previous visit (from the past 240 hour(s)).   Studies: [place] Ekg Site Rite  Result Date: 12/25/2017 If [facility] image not attached, placement could not be confirmed due to current cardiac rhythm.   Scheduled Meds: . baclofen  20 mg Oral QID  . chlorhexidine  15 mL Mouth Rinse BID  . Chlorhexidine Gluconate Cloth  6 each Topical Daily  . cyclobenzaprine  5 mg Oral BID   And  . cyclobenzaprine  10 mg Oral QHS  . diazepam  2 mg Intravenous BID  . erythromycin  250 mg Oral TID  . liver oil-zinc oxide   Topical QID  . mouth rinse  15 mL Mouth Rinse BID  . metoprolol tartrate  5 mg Intravenous Q6H  . sodium chloride flush  10-40 mL Intracatheter Q12H  . tiZANidine  2 mg Oral TID   Continuous Infusions: . sodium chloride 10 mL/hr at 12/22/17 1900  . heparin 1,700 Units/hr (12/25/17 2115)  . TPN  ADULT (ION) 75 mL/hr at 12/25/17 1721  . TPN ADULT (ION)      Time spent: 25 minutes. More than 50% of that time was spent in counseling and/or coordination of care.  Nariya Neumeyer  [facility] Pager [phone]  If 7PM-7AM, please contact night-coverage at [email or web address], password [facility] 12/26/2017, 11:24 AM

## 2017-12-26 NOTE — Progress Notes (Addendum)
  Speech Language Pathology Treatment: Dysphagia  Patient Details Name: Keinan Brouillet MRN: [id number] DOB: 1980/12/16 Today's Date: 12/26/2017 Time: 8295-6213 SLP Time Calculation (min) (ACUTE ONLY): 30 min  Assessment / Plan / Recommendation Clinical Impression  Pt's sister present during po trial for potential initiation of diet. Positioning posed a significant challenge d/t involuntary, spastic movements from cerebral palsy led to body extending to supine and sliding. Head of bed and legs partially elevated using the reverse trendelenburg position with pillows or folded up blankets between pt's feet and end of bed found to be optimal. Improvements observed today with food/liquid trials including more coordinated swallow and respiratory cycle, stable RR, no jerking movements during swallow. Pt consumed regular textures at home and today able to masticate with decreased mandibular excursion and incomplete labial closure often characteristics of pt's with CP. Mastication was timely and without residue. No s/s aspiration with solid or liquid via straw although risk is increased due to cerebral palsy and positioning. Recommend regular texture, thin liquids, pills whole in applesauce and full supervision and assist. Please ensure optimal positioning using reverse trendelenburg. ST will follow up.    HPI HPI: Pt is a 37 y/o male presenting with SBO s/p ex lap 11/18 and acute respiratory failure requiring ETT 11/18-11/26. PMH of Cerebral palsy, DVT on coumadin, HTN, and chronic constipation.      SLP Plan  Continue with current plan of care       Recommendations  Diet recommendations: Regular;Thin liquid Liquids provided via: Cup;Straw Medication Administration: Whole meds with puree Supervision: Staff to assist with self feeding;Full supervision/cueing for compensatory strategies Compensations: Slow rate;Small sips/bites Postural Changes and/or Swallow Maneuvers: Seated upright 90 degrees                 Oral Care Recommendations: Oral care BID Follow up Recommendations: None SLP Visit Diagnosis: Dysphagia, unspecified (R13.10) Plan: Continue with current plan of care                      Houston Siren 12/26/2017, 3:49 PM  Orbie Pyo Rykin Route M.Ed Risk analyst [phone] Office [phone]

## 2017-12-26 NOTE — Progress Notes (Signed)
PHARMACY - ADULT TOTAL PARENTERAL NUTRITION CONSULT NOTE  Pharmacy Consult:  TPN Indication:  SBO  Patient Measurements: Height: _0  (165.1 cm) Weight: (bed was not zeroed prior to putting pt in ) IBW/kg (Calculated) : 61.5  AdjBW = 68.8 kg   Body mass index is 33.71 kg/m. Usual Weight: 90.7 kg  Assessment:  Patient admitted 11/17 with abdominal pain, N/V, lactic acidosis to 6 and CT scan supportive of SBO. Patient underwent emergent ex-lap with LoA on 12/15/17.  Patient was extubated in OR, transferred to 83M, then intubated again due to insufficient oxygenation.  TPN was started because historically patient has had long post-op ileus.  GI: prealbumin 11.7>10.6, NG O/P 373m vomited 11/25.  Erythromycin, s/p 5d Reglan.  May start TF in next day or two if tolerates. Endo: no hx DM - CBGs controlled, off SSI Lytes: all WNL (Na high normal) Renal: SCr 0.85 - UOP 1.1 ml/kg/hr Pulm: extubated 11/26, now on RA Cards: BP ok, tachy - IV metoprolol Heme/AC: Coumadin PTA for hx DVTs >> Heparin, therapeutic, CBC stable Hepatobil: LFTs and alk phos now elevated, Tbili 2.6, TG 96>140 Neuro: cerebal palsy, requires chronic antispasmodics. Intermittent agitation.  Baclofen, Flexeril, diazepam, tizanidine, PRN Haldol/Versed ID: s/p Fortaz for Pseudomonas UTI 11/20-11/25, afebrile, WBC WNL TPN Access: triple lumen placed 12/15/17 TPN start date: 12/15/17  Nutritional Goals (per RD assessment on 11/27): 1900-2100 kCal, 100-115gm protein, 2L fluid per day  Current Nutrition:  TPN   Plan:  Continue TPN at 75 ml/hr, providing 112g AA, 306g CHO and 54g ILE for a total of 2027 kCal, meeting 100% of patient's needs. Electrolytes in TPN: reduce sodium, Cl:Ac 1:2 Daily multivitamin, trace elements, and famotidine 429min TPN  F/U with ability to start TF, repeat labs on Sunday   Jacob Chamblee D. DaMina MarblePharmD, BCPS, BCLake Stickney1/29/2019, 8:49 AM

## 2017-12-26 NOTE — Progress Notes (Signed)
ANTICOAGULATION CONSULT NOTE - Follow Up Consult  Pharmacy Consult for heparin Indication: DVT  Allergies  Allergen Reactions  . Betadine [Povidone Iodine] Other (See Comments)    Unknown reaction per caregiver  . Ciprofloxacin Other (See Comments)    Unknown reaction per caregiver  . Morphine Other (See Comments)    Unknown reaction per caregiver    Patient Measurements: Height: 5\' 5"  (165.1 cm) Weight: (bed was not zeroed prior to putting pt in ) IBW/kg (Calculated) : 61.5 Heparin Dosing Weight: 84 kg  Vital Signs: Temp: 98.7 F (37.1 C) (11/29 0757) Temp Source: Oral (11/29 0757) BP: 115/71 (11/29 0757) Pulse Rate: 98 (11/29 0757)  Labs: Recent Labs    12/24/17 0418  12/24/17 1500 12/24/17 1704 12/25/17 0917 12/25/17 1500 12/26/17 0622 12/26/17 0623  HGB 10.4*  --   --   --   --   --   --  11.0*  HCT 33.6*  --   --   --   --   --   --  37.5*  PLT 280  --   --   --   --   --   --  414*  HEPARINUNFRC <0.10*   < > 0.24*  --   --  0.49 0.63  --   CREATININE 0.78  --   --   --  0.86  --   --  0.85  CKTOTAL  --   --   --  95  --   --   --   --    < > = values in this interval not displayed.    Estimated Creatinine Clearance: 122.8 mL/min (by C-G formula based on SCr of 0.85 mg/dL).   Medications:  Scheduled:  . baclofen  20 mg Oral QID  . chlorhexidine  15 mL Mouth Rinse BID  . Chlorhexidine Gluconate Cloth  6 each Topical Daily  . cyclobenzaprine  5 mg Oral BID   And  . cyclobenzaprine  10 mg Oral QHS  . diazepam  2 mg Intravenous BID  . erythromycin  250 mg Oral TID  . liver oil-zinc oxide   Topical QID  . mouth rinse  15 mL Mouth Rinse BID  . metoprolol tartrate  5 mg Intravenous Q6H  . sodium chloride flush  10-40 mL Intracatheter Q12H  . tiZANidine  2 mg Oral TID    Assessment: 37 year old male with cerebral palsy. He was admitted with abdominal pain due to a SBO. He went for an ex lap which only showed a dilated small intestine without adhesions.  He is currently on TPN while awaiting recovery of bowel function. 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 His INR and warfarin were reversed upon admission for surgery. He remains on heparin while coumadin on hold. He is tolerating po meds -heparin level= 0.63, CBC stable   Goal of Therapy:  Heparin level 0.3-0.7 units/ml  Monitor platelets by anticoagulation protocol: Yes   Plan:  -No heparin changes needed -Daily heparin level and CBC -Consider restarting coumadin?  Hildred Laser, PharmD Clinical Pharmacist **Pharmacist phone directory can now be found on [email or web address] (PW [email or web address]).  Listed under [place].

## 2017-12-26 NOTE — Care Management Important Message (Signed)
Important Message  Patient Details  Name: [name] MRN: [id number] Date of Birth: 1980/12/18   Medicare Important Message Given:  Yes. Patient was not in the room at the time. CMA left IM with patient room.    Tyreck Bell 12/26/2017, 12:07 PM

## 2017-12-27 LAB — CBC
HCT: 37.8 % — ABNORMAL LOW (ref 39.0–52.0)
Hemoglobin: 11.8 g/dL — ABNORMAL LOW (ref 13.0–17.0)
MCH: 28.9 pg (ref 26.0–34.0)
MCHC: 31.2 g/dL (ref 30.0–36.0)
MCV: 92.6 fL (ref 80.0–100.0)
Platelets: 395 10*3/uL (ref 150–400)
RBC: 4.08 MIL/uL — ABNORMAL LOW (ref 4.22–5.81)
RDW: 14.6 % (ref 11.5–15.5)
WBC: 10.3 10*3/uL (ref 4.0–10.5)
nRBC: 0 % (ref 0.0–0.2)

## 2017-12-27 LAB — GLUCOSE, CAPILLARY
GLUCOSE-CAPILLARY: 98 mg/dL (ref 70–99)
Glucose-Capillary: 95 mg/dL (ref 70–99)
Glucose-Capillary: 104 mg/dL — ABNORMAL HIGH (ref 70–99)
Glucose-Capillary: 110 mg/dL — ABNORMAL HIGH (ref 70–99)
Glucose-Capillary: 90 mg/dL (ref 70–99)
Glucose-Capillary: 96 mg/dL (ref 70–99)

## 2017-12-27 LAB — PROTIME-INR
INR: 1.27
Prothrombin Time: 15.8 seconds — ABNORMAL HIGH (ref 11.4–15.2)

## 2017-12-27 LAB — HEPARIN LEVEL (UNFRACTIONATED): HEPARIN UNFRACTIONATED: 0.47 [IU]/mL (ref 0.30–0.70)

## 2017-12-27 MED ORDER — WARFARIN SODIUM 2 MG PO TABS: 3.0000 mg | ORAL_TABLET | Freq: Once | ORAL | Status: DC

## 2017-12-27 MED ORDER — LOPERAMIDE HCL 2 MG PO CAPS
4.0000 mg | ORAL_CAPSULE | ORAL | Status: DC | PRN
  Administered 2017-12-27 (×2): 4 mg via ORAL
  Filled 2017-12-27 (×2): qty 2

## 2017-12-27 MED ORDER — FAMOTIDINE 20 MG PO TABS
40.0000 mg | ORAL_TABLET | Freq: Every day | ORAL | Status: DC
  Administered 2017-12-27: 40 mg via ORAL
  Filled 2017-12-27: qty 2

## 2017-12-27 MED ORDER — METOPROLOL SUCCINATE ER 100 MG PO TB24
100.0000 mg | ORAL_TABLET | Freq: Every day | ORAL | Status: DC
  Administered 2017-12-27 – 2017-12-28 (×2): 100 mg via ORAL
  Filled 2017-12-27 (×2): qty 1

## 2017-12-27 MED ORDER — WARFARIN - PHARMACIST DOSING INPATIENT: Freq: Every day | Status: DC

## 2017-12-27 MED ORDER — WARFARIN SODIUM 2 MG PO TABS
3.0000 mg | ORAL_TABLET | Freq: Once | ORAL | Status: AC
  Administered 2017-12-27: 3 mg via ORAL
  Filled 2017-12-27: qty 1

## 2017-12-27 NOTE — Progress Notes (Signed)
PHARMACY - ADULT TOTAL PARENTERAL NUTRITION CONSULT NOTE  Pharmacy Consult:  TPN Indication:  SBO  Patient Measurements: Height: _0  (165.1 cm) Weight: (bed was not zeroed prior to putting pt in ) IBW/kg (Calculated) : 61.5  AdjBW = 68.8 kg   Body mass index is 33.71 kg/m. Usual Weight: 90.7 kg  Assessment:  Patient admitted 11/17 with abdominal pain, N/V, lactic acidosis to 6 and CT scan supportive of SBO. Patient underwent emergent ex-lap with LoA on 12/15/17.  Patient was extubated in OR, transferred to 36M, then intubated again due to insufficient oxygenation.  TPN was started because historically patient has had long post-op ileus.  GI: prealbumin 11.7>10.6, NG O/P 361m vomited 11/25.  Erythromycin, s/p 5d Reglan, PRN loperamide.   Endo: no hx DM - CBGs acceptable, off SSI Lytes: 11/29 labs - all WNL (Na high normal) Renal: SCr 0.85 - UOP 0.5 ml/kg/hr Pulm: extubated 11/26, stable on RA Cards: BP ok, tachy improving - IV metoprolol Heme/AC: Coumadin PTA for hx DVTs >> Heparin, therapeutic, CBC stable Hepatobil: LFTs and alk phos now elevated, Tbili 2.6, TG 96>140 Neuro: cerebal palsy, requires chronic antispasmodics. Intermittent agitation.  Baclofen, Flexeril, diazepam, tizanidine, PRN Haldol/Versed ID: s/p Fortaz for Pseudomonas UTI 11/20-11/25, afebrile, WBC WNL TPN Access: triple lumen removed, PICC placed 12/26/17 TPN start date: 12/15/17  Nutritional Goals (per RD assessment on 11/27): 1900-2100 kCal, 100-115gm protein, 2L fluid per day  Current Nutrition:  TPN  Regular diet - ate 30-40% of breakfast (didn't like the egg and bacon) and ~50% of lunch.  Denies abd pain, nausea/vomiting.  Plan:  Hold TPN today per discussion with Dr. ADaryll Brod to assess PO intake.  Reduce TPN to 30 ml/hr at 1600, then stop at 1800 to avoid hypoglycemia. Pepcid 423mPO QHS F/U in AM   Ahtziri Jeffries D. DaMina MarblePharmD, BCPS, BCCenterville1/30/2019, 1:06 PM

## 2017-12-27 NOTE — Progress Notes (Signed)
PROGRESS NOTE    [name]  QQV:[id number] DOB: [birth year] DOA: 12/14/2017 PCP: [name], MD   Brief Narrative: Patient is a 37 year old male with past medical history of cerebral palsy, DVT on Coumadin, hypertension, chronic constipation currently living at home with caregiver who presented to the hospital with complaints of nausea, vomiting and abdominal pain.  CT imaging showed small bowel obstruction.  There was also concern for ischemic bowel as he presented with high level of lactic acid.  He underwent laparotomy on 12/15/2017 and was found to have diffusely dilated small bowel without transition point and a small amount of adhesion but no wall ischemia.  He was initially transferred to ICU.  Patient had a prolonged postop ileus so TPN was started.  Started on diet today.  Plan is to taper TPN and discharge him home tomorrow.  Significant Events: 11/17 - admit to [facility] 11/18 - Exploratory Laparotomy with lysis of adhesions 11/18 - Desaturation into 50s and initially unresponsive, now intubated and sedated 11/18 - Central Line placed, TPN started 11/19 - Foley placed requiring Urology 11/19 - UCx grew >100k Pseudomonas Aerg. 11/19 - Bil. Duplex U/S of LE neg for DVTs 11/20 - EEG: slowly most likely due to sedating medications; no signs of seizure activity 11/26 - extubated  11/28 - TRH assumed care  Assessment & Plan:   Principal Problem:   Small bowel obstruction (HCC) Active Problems:   Infantile cerebral palsy (HCC)   Atrial fibrillation (HCC)   Chronic constipation   Hypertension   Spasticity   Neurogenic bladder   Lactic acidosis   Anticoagulated   Respiratory failure (HCC)   Encephalopathy acute   Pressure injury of skin   Small bowel obstruction/ileus: Status post laparotomy.  Had prolonged post op ileus and was receiving TPN.  Surgery was following. Patient has been started on diet now.  He had a bowel movement.  Will taper TPN and completely stop  tomorrow. Likely he will be sent home tomorrow  Cerebral palsy/neurogenic bladder/spasticity: Continue supportive care.  On diazepam, Flexeril,  Tizanidine.  Dysphagia: Secondary to prolonged intubation.  Speech was following.  Started on diet.  Pseudomonas UTI: Completed course of IV antibiotic  Hypertension: Continue current medications.  Sinus tachycardia: Chronic?  Will resume his home medications.On metoprolol succinate.  History of DVT/proximal A. fib: On Coumadin at home.  Was on heparin drip until now.  Will start on Coumadin today.  Elevated LFT: We will check CMP tomorrow.  Nutrition Problem: Inadequate oral intake Etiology: inability to eat      DVT prophylaxis: heparin Code Status: Full Family Communication: Caregiver at bedside Disposition Plan: Home tomorrow   Consultants: General surgery, PCCM  Procedures: Laparotomy  Antimicrobials: None  Subjective: Patient seen and examined the bedside this morning.  Remains comfortable.  Denies any abdominal pain.  Says that he had a bowel movement.  Very happy to hear that he might be soon discharged home.  Objective: Vitals:   12/27/17 0106 12/27/17 0115 12/27/17 0413 12/27/17 0750  BP: (!) 87/67 118/69 (!) 138/104 113/85  Pulse: 96  72 92  Resp:  (!) 32  (!) 24  Temp: 98.6 F (37 C)  98.4 F (36.9 C)   TempSrc: Oral  Oral   SpO2: 100%  96% 93%  Weight:      Height:        Intake/Output Summary (Last 24 hours) at 12/27/2017 1301 Last data filed at 12/27/2017 0349 Gross per 24 hour  Intake 2252.75 ml  Output 1050 ml  Net 1202.75 ml   Filed Weights   12/17/17 0500 12/23/17 2300 12/24/17 0415  Weight: 101 kg 91.9 kg 91.9 kg    Examination:  General exam: Appears calm and comfortable ,Not in distress,average built, mental retardation HEENT:PERRL,Oral mucosa moist, Ear/Nose normal on gross exam Respiratory system: Bilateral equal air entry, normal vesicular breath sounds, no wheezes or crackles    Cardiovascular system: Sinus tachycardia. No JVD, murmurs, rubs, gallops or clicks. No pedal edema. Gastrointestinal system: Abdomen is distended, soft and nontender. No organomegaly or masses felt. Normal bowel sounds heard.  Surgical incision site appears healthy Central nervous system: Alert and oriented. No focal neurological deficits. Extremities: No edema, no clubbing ,no cyanosis, distal peripheral pulses palpable.  Spasticity Skin: No rashes, lesions or ulcers,no icterus ,no pallor Psychiatry: Judgement and insight appear impaired   Data Reviewed: I have personally reviewed following labs and imaging studies  CBC: Recent Labs  Lab 12/22/17 0506 12/23/17 0502 12/24/17 0418 12/26/17 0623 12/27/17 0504  WBC 5.9 8.4 6.1 8.9 10.3  NEUTROABS 3.9  --   --   --   --   HGB 10.4* 10.9* 10.4* 11.0* 11.8*  HCT 33.2* 34.1* 33.6* 37.5* 37.8*  MCV 90.2 91.4 92.3 95.4 92.6  PLT 270 291 280 414* 485   Basic Metabolic Panel: Recent Labs  Lab 12/21/17 0437 12/22/17 0506 12/23/17 0502 12/24/17 0418 12/25/17 0917 12/26/17 0623  NA 135 135 137 141 142 145  K 3.3* 3.9 3.9 3.9 4.2 4.3  CL 101 100 99 104 105 111  CO2 29 30 31  32 27 27  GLUCOSE 132* 142* 137* 121* 108* 115*  BUN 14 15 19  23* 25* 22*  CREATININE 0.65 0.67 0.77 0.78 0.86 0.85  CALCIUM 7.7* 8.0* 8.5* 8.7* 8.8* 9.0  MG 1.9 2.1 2.0 2.1 2.0  --   PHOS 2.9 3.1 4.1 3.9 3.7  --    GFR: Estimated Creatinine Clearance: 122.8 mL/min (by C-G formula based on SCr of 0.85 mg/dL). Liver Function Tests: Recent Labs  Lab 12/22/17 0506 12/24/17 0418 12/25/17 0917  AST 27 146* 96*  ALT 23 102* 126*  ALKPHOS 53 198* 173*  BILITOT 0.4 2.6* 0.9  PROT 5.1* 5.6* 6.1*  ALBUMIN 2.2* 2.4* 2.8*   No results for input(s): LIPASE, AMYLASE in the last 168 hours. No results for input(s): AMMONIA in the last 168 hours. Coagulation Profile: No results for input(s): INR, PROTIME in the last 168 hours. Cardiac Enzymes: Recent Labs  Lab  12/24/17 1704  CKTOTAL 95   BNP (last 3 results) No results for input(s): PROBNP in the last 8760 hours. HbA1C: No results for input(s): HGBA1C in the last 72 hours. CBG: Recent Labs  Lab 12/26/17 2008 12/27/17 0101 12/27/17 0359 12/27/17 0746 12/27/17 1153  GLUCAP 105* 98 95 104* 110*   Lipid Profile: No results for input(s): CHOL, HDL, LDLCALC, TRIG, CHOLHDL, LDLDIRECT in the last 72 hours. Thyroid Function Tests: No results for input(s): TSH, T4TOTAL, FREET4, T3FREE, THYROIDAB in the last 72 hours. Anemia Panel: No results for input(s): VITAMINB12, FOLATE, FERRITIN, TIBC, IRON, RETICCTPCT in the last 72 hours. Sepsis Labs: No results for input(s): PROCALCITON, LATICACIDVEN in the last 168 hours.  No results found for this or any previous visit (from the past 240 hour(s)).       Radiology Studies: [place] Ekg Site Rite  Result Date: 12/25/2017 If [facility] image not attached, placement could not be confirmed due to current cardiac rhythm.  Scheduled Meds: . baclofen  20 mg Oral QID  . chlorhexidine  15 mL Mouth Rinse BID  . Chlorhexidine Gluconate Cloth  6 each Topical Daily  . cyclobenzaprine  5 mg Oral BID   And  . cyclobenzaprine  10 mg Oral QHS  . diazepam  2 mg Intravenous BID  . erythromycin  250 mg Oral TID  . liver oil-zinc oxide   Topical QID  . mouth rinse  15 mL Mouth Rinse BID  . metoprolol tartrate  5 mg Intravenous Q6H  . sodium chloride flush  10-40 mL Intracatheter Q12H  . sodium chloride flush  10-40 mL Intracatheter Q12H  . tiZANidine  2 mg Oral TID   Continuous Infusions: . sodium chloride 10 mL/hr at 12/22/17 1900  . heparin 1,700 Units/hr (12/27/17 0349)  . TPN ADULT (ION) 75 mL/hr at 12/26/17 1732     LOS: 13 days    Time spent: 35 mins.More than 50% of that time was spent in counseling and/or coordination of care.      Shelly Coss, MD [facility] Pager [phone]  If 7PM-7AM, please contact  night-coverage [email or web address] Password [email or web address] 12/27/2017, 1:01 PM

## 2017-12-27 NOTE — Progress Notes (Signed)
ANTICOAGULATION CONSULT NOTE - Follow Up Consult  Pharmacy Consult for heparin Indication: DVT  Allergies  Allergen Reactions  . Betadine [Povidone Iodine] Other (See Comments)    Unknown reaction per caregiver  . Ciprofloxacin Other (See Comments)    Unknown reaction per caregiver  . Morphine Other (See Comments)    Unknown reaction per caregiver    Patient Measurements: Height: 5\' 5"  (165.1 cm) Weight: (bed was not zeroed prior to putting pt in ) IBW/kg (Calculated) : 61.5 Heparin Dosing Weight: 84 kg  Vital Signs: Temp: 98.4 F (36.9 C) (11/30 0413) Temp Source: Oral (11/30 0413) BP: 113/85 (11/30 0750) Pulse Rate: 92 (11/30 0750)  Labs: Recent Labs    12/24/17 1704 12/25/17 0917 12/25/17 1500 12/26/17 0622 12/26/17 0623 12/27/17 0504 12/27/17 0531  HGB  --   --   --   --  11.0* 11.8*  --   HCT  --   --   --   --  37.5* 37.8*  --   PLT  --   --   --   --  414* 395  --   HEPARINUNFRC  --   --  0.49 0.63  --   --  0.47  CREATININE  --  0.86  --   --  0.85  --   --   CKTOTAL 95  --   --   --   --   --   --     Estimated Creatinine Clearance: 122.8 mL/min (by C-G formula based on SCr of 0.85 mg/dL).   Medications:  Scheduled:  . baclofen  20 mg Oral QID  . chlorhexidine  15 mL Mouth Rinse BID  . Chlorhexidine Gluconate Cloth  6 each Topical Daily  . cyclobenzaprine  5 mg Oral BID   And  . cyclobenzaprine  10 mg Oral QHS  . diazepam  2 mg Intravenous BID  . erythromycin  250 mg Oral TID  . liver oil-zinc oxide   Topical QID  . mouth rinse  15 mL Mouth Rinse BID  . metoprolol tartrate  5 mg Intravenous Q6H  . sodium chloride flush  10-40 mL Intracatheter Q12H  . sodium chloride flush  10-40 mL Intracatheter Q12H  . tiZANidine  2 mg Oral TID    Assessment: 37 year old male with cerebral palsy. He was admitted with abdominal pain due to a SBO. He went for an ex lap which only showed a dilated small intestine without adhesions. He is currently on TPN  while awaiting recovery of bowel function. 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 His INR and warfarin were reversed upon admission for surgery. He remains on heparin while coumadin on hold. Swallow eval done 11/29 and can take oral medications -heparin level= 0.47, CBC stable   Goal of Therapy:  Heparin level 0.3-0.7 units/ml  Monitor platelets by anticoagulation protocol: Yes   Plan:  -No heparin changes needed -Daily heparin level and CBC -Consider restarting coumadin?  Hildred Laser, PharmD Clinical Pharmacist **Pharmacist phone directory can now be found on [email or web address] (PW [email or web address]).  Listed under [place].

## 2017-12-27 NOTE — Progress Notes (Signed)
ANTICOAGULATION CONSULT NOTE - Follow Up Consult  Pharmacy Consult for heparin, coumadin Indication: DVT  Allergies  Allergen Reactions  . Betadine [Povidone Iodine] Other (See Comments)    Unknown reaction per caregiver  . Ciprofloxacin Other (See Comments)    Unknown reaction per caregiver  . Morphine Other (See Comments)    Unknown reaction per caregiver    Patient Measurements: Height: 5\' 5"  (165.1 cm) Weight: (bed was not zeroed prior to putting pt in ) IBW/kg (Calculated) : 61.5 Heparin Dosing Weight: 84 kg  Vital Signs: Temp: 98.4 F (36.9 C) (11/30 0413) Temp Source: Oral (11/30 0413) BP: 113/85 (11/30 0750) Pulse Rate: 92 (11/30 0750)  Labs: Recent Labs    12/24/17 1704 12/25/17 0917 12/25/17 1500 12/26/17 0622 12/26/17 0623 12/27/17 0504 12/27/17 0531  HGB  --   --   --   --  11.0* 11.8*  --   HCT  --   --   --   --  37.5* 37.8*  --   PLT  --   --   --   --  414* 395  --   HEPARINUNFRC  --   --  0.49 0.63  --   --  0.47  CREATININE  --  0.86  --   --  0.85  --   --   CKTOTAL 95  --   --   --   --   --   --     Estimated Creatinine Clearance: 122.8 mL/min (by C-G formula based on SCr of 0.85 mg/dL).   Medications:  Scheduled:  . baclofen  20 mg Oral QID  . chlorhexidine  15 mL Mouth Rinse BID  . Chlorhexidine Gluconate Cloth  6 each Topical Daily  . cyclobenzaprine  5 mg Oral BID   And  . cyclobenzaprine  10 mg Oral QHS  . diazepam  2 mg Intravenous BID  . erythromycin  250 mg Oral TID  . liver oil-zinc oxide   Topical QID  . mouth rinse  15 mL Mouth Rinse BID  . metoprolol succinate  100 mg Oral Daily  . sodium chloride flush  10-40 mL Intracatheter Q12H  . sodium chloride flush  10-40 mL Intracatheter Q12H  . tiZANidine  2 mg Oral TID    Assessment: 37 year old male with cerebral palsy. He was admitted with abdominal pain due to a SBO. He went for an ex lap which only showed a dilated small intestine without adhesions. He is currently on  TPN while awaiting recovery of bowel function. 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 His INR and warfarin were reversed upon admission for surgery. He remains on heparin while coumadin on hold. Swallow eval done 11/29 and may resume coumadin today (he is noted on erythromycin which may increase coumadin sensitivity) -heparin level= 0.47, CBC stable  Home coumadin dose: 3mg /day  Goal of Therapy:  Heparin level 0.3-0.7 units/ml  Monitor platelets by anticoagulation protocol: Yes   Plan:  -No heparin changes needed -Daily heparin level and CBC -Coumadin 3mg  po today -Daily PT/INR   Hildred Laser, PharmD Clinical Pharmacist **Pharmacist phone directory can now be found on [email or web address] (PW [email or web address]).  Listed under [place].
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
## 2017-12-28 LAB — GLUCOSE, CAPILLARY
GLUCOSE-CAPILLARY: 103 mg/dL — AB (ref 70–99)
Glucose-Capillary: 91 mg/dL (ref 70–99)
Glucose-Capillary: 90 mg/dL (ref 70–99)
Glucose-Capillary: 101 mg/dL — ABNORMAL HIGH (ref 70–99)

## 2017-12-28 LAB — COMPREHENSIVE METABOLIC PANEL
ALK PHOS: 89 U/L (ref 38–126)
ALT: 123 U/L — ABNORMAL HIGH (ref 0–44)
AST: 55 U/L — ABNORMAL HIGH (ref 15–41)
Albumin: 2.8 g/dL — ABNORMAL LOW (ref 3.5–5.0)
Anion gap: 9 (ref 5–15)
BUN: 17 mg/dL (ref 6–20)
CALCIUM: 8.6 mg/dL — AB (ref 8.9–10.3)
CO2: 26 mmol/L (ref 22–32)
Chloride: 105 mmol/L (ref 98–111)
Creatinine, Ser: 0.85 mg/dL (ref 0.61–1.24)
GFR calc Af Amer: >60 mL/min (ref >60)
GFR calc non Af Amer: >60 mL/min (ref >60)
GLUCOSE: 90 mg/dL (ref 70–99)
Potassium: 4.1 mmol/L (ref 3.5–5.1)
Sodium: 140 mmol/L (ref 135–145)
TOTAL PROTEIN: 6.1 g/dL — AB (ref 6.5–8.1)
Total Bilirubin: 0.6 mg/dL (ref 0.3–1.2)

## 2017-12-28 LAB — CBC
HCT: 35.5 % — ABNORMAL LOW (ref 39.0–52.0)
Hemoglobin: 10.9 g/dL — ABNORMAL LOW (ref 13.0–17.0)
MCH: 29.1 pg (ref 26.0–34.0)
MCHC: 30.7 g/dL (ref 30.0–36.0)
MCV: 94.9 fL (ref 80.0–100.0)
Platelets: 356 10*3/uL (ref 150–400)
RBC: 3.74 MIL/uL — ABNORMAL LOW (ref 4.22–5.81)
RDW: 14.5 % (ref 11.5–15.5)
WBC: 7.9 10*3/uL (ref 4.0–10.5)
nRBC: 0 % (ref 0.0–0.2)

## 2017-12-28 LAB — HEPARIN LEVEL (UNFRACTIONATED): Heparin Unfractionated: 0.2 IU/mL — ABNORMAL LOW (ref 0.30–0.70)

## 2017-12-28 LAB — PROTIME-INR
INR: 1.22
Prothrombin Time: 15.3 seconds — ABNORMAL HIGH (ref 11.4–15.2)

## 2017-12-28 NOTE — Discharge Instructions (Signed)
POST OP INSTRUCTIONS  1. DIET: As tolerated. Consider avoiding raw/uncooked fruits/vegetables for the next 3 - 4 weeks  2. Take your usually prescribed home medications unless otherwise directed.  3. PAIN CONTROL: a. Pain is best controlled by a usual combination of three different methods TOGETHER: i. Ice/Heat ii. Over the counter pain medication iii. Prescription pain medication b. Most patients will experience some swelling and bruising around the surgical site.  Ice packs or heating pads (30-60 minutes up to 6 times a day) will help. Some people prefer to use ice alone, heat alone, alternating between ice & heat.  Experiment to what works for you.  Swelling and bruising can take several weeks to resolve.   c. It is helpful to take an over-the-counter pain medication regularly for the first few weeks: i. Ibuprofen (Motrin/Advil) - 200mg  tabs - take 3 tabs (600mg ) every 6 hours as needed for pain ii. Acetaminophen (Tylenol) - you may take 650mg  every 6 hours as needed. You can take this with motrin as they act differently on the body. If you are taking a narcotic pain medication that has acetaminophen in it, do not take over the counter tylenol at the same time.  Iii. NOTE: You may take both of these medications together - most patients  find it most helpful when alternating between the two (i.e. Ibuprofen at 6am,  tylenol at 9am, ibuprofen at 12pm ...)  4. Avoid getting constipated.  Between the surgery and the pain medications, it is common to experience some constipation.  Increasing fluid intake and taking a fiber supplement (such as Metamucil, Citrucel, FiberCon, MiraLax, etc) 1-2 times a day regularly will usually help prevent this problem from occurring.  A mild laxative (prune juice, Milk of Magnesia, MiraLax, etc) should be taken according to package directions if there are no bowel movements after 48 hours.    5. Dressing: Ok to get incision wet with soap/water  6. ACTIVITIES as  tolerated:   a. Avoid heavy lifting (>10lbs or 1 gallon of milk) for the next 6 weeks. b. You may resume regular (light) daily activities beginning the next day--such as daily self-care, walking, climbing stairs--gradually increasing activities as tolerated.  If you can walk 30 minutes without difficulty, it is safe to try more intense activity such as jogging, treadmill, bicycling, low-impact aerobics.  c. DO NOT PUSH THROUGH PAIN.  Let pain be your guide: If it hurts to do something, don't do it. d. Dennis Bast may drive when you are no longer taking prescription pain medication, you can comfortably wear a seatbelt, and you can safely maneuver your car and apply brakes. e. Dennis Bast may have sexual intercourse when it is comfortable.   7. FOLLOW UP in our office a. Please call [facility] at [phone] to set up an appointment to see your surgeon in the office for a follow-up appointment approximately 2 weeks after your surgery. b. Make sure that you call for this appointment the day you arrive home to insure a convenient appointment time.  9. If you have disability or family leave forms that need to be completed, you may have them completed by your primary care physician's office; for return to work instructions, please ask our office staff and they will be happy to assist you in obtaining this documentation   When to call us [phone]: 1. Poor pain control 2. Reactions / problems with new medications (rash/itching, etc)  3. Fever over 101.5 F (38.5 C) 4. Inability to urinate 5. Nausea/vomiting  6. Worsening swelling or bruising 7. Continued bleeding from incision. 8. Increased pain, redness, or drainage from the incision  The clinic staff is available to answer your questions during regular business hours (8:30am-5pm).  Please dont hesitate to call and ask to speak to one of our nurses for clinical concerns.   A surgeon from [facility] is always on call at the hospitals   If you have a  medical emergency, go to the nearest emergency room or call 911.  [facility], [place] [address] [place], [place], [place]  [address] MAIN: [phone] FAX: [phone] [email or web address]

## 2017-12-28 NOTE — Progress Notes (Signed)
PHARMACY - ADULT TOTAL PARENTERAL NUTRITION CONSULT NOTE  Pharmacy Consult:  TPN Indication:  SBO  Patient Measurements: Height: _0  (165.1 cm) Weight: (Old bed, scale was misbehaving) IBW/kg (Calculated) : 61.5  AdjBW = 68.8 kg   Body mass index is 33.71 kg/m. Usual Weight: 90.7 kg  Assessment:  Patient admitted 11/17 with abdominal pain, N/V, lactic acidosis to 6 and CT scan supportive of SBO. Patient underwent emergent ex-lap with LoA on 12/15/17.  Patient was extubated in OR, transferred to 6M, then intubated again due to insufficient oxygenation.  TPN was started because historically patient has had long post-op ileus.  GI: prealbumin 11.7>10.6, +BM.  Pepcid PO, erythromycin, s/p 5d Reglan, PRN loperamide.   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 Intermittent agitation.  Baclofen, Flexeril, diazepam, tizanidine, PRN Haldol/Versed ID: s/p Fortaz for Pseudomonas UTI 11/20-11/25, afebrile, WBC WNL TPN Access: triple lumen removed, PICC placed 12/26/17 TPN start date: 12/15/17  Nutritional Goals (per RD assessment on 11/27): 1900-2100 kCal, 100-115gm protein, 2L fluid per day  Current Nutrition:  Regular diet:  ate 50-60% of meals.  Denies abd pain, nausea/vomiting.  Off TPN since 12/27/17  Plan:  D/C TPN consult given plan for discharge and patient consuming 50-60% of meals D/C TPN labs and nursing care orders   Khalee Mazo D. Mina Marble, PharmD, BCPS, [place] 12/28/2017, 12:42 PM

## 2017-12-28 NOTE — Progress Notes (Signed)
Learned for heparin Indication: DVT  Allergies  Allergen Reactions  . Betadine [Povidone Iodine] Other (See Comments)    Unknown reaction per caregiver  . Ciprofloxacin Other (See Comments)    Unknown reaction per caregiver  . Morphine Other (See Comments)    Unknown reaction per caregiver    Patient Measurements: Height: 5\' 5"  (165.1 cm) Weight: (bed was not zeroed prior to putting pt in ) IBW/kg (Calculated) : 61.5 Heparin Dosing Weight: 84 kg  Vital Signs: Temp: 98.5 F (36.9 C) (12/01 0028) Temp Source: Oral (12/01 0028) BP: 127/76 (12/01 0028) Pulse Rate: 111 (12/01 0028)  Labs: Recent Labs    12/25/17 0917  12/26/17 0622  12/26/17 4287 12/27/17 0504 12/27/17 0531 12/27/17 1548 12/28/17 0413  HGB  --   --   --    < > 11.0* 11.8*  --   --  10.9*  HCT  --   --   --   --  37.5* 37.8*  --   --  35.5*  PLT  --   --   --   --  414* 395  --   --  356  LABPROT  --   --   --   --   --   --   --  15.8* 15.3*  INR  --   --   --   --   --   --   --  1.27 1.22  HEPARINUNFRC  --    < > 0.63  --   --   --  0.47  --  0.20*  CREATININE 0.86  --   --   --  0.85  --   --   --   --    < > = values in this interval not displayed.    Estimated Creatinine Clearance: 122.8 mL/min (by C-G formula based on SCr of 0.85 mg/dL).   Medications:  Scheduled:  . baclofen  20 mg Oral QID  . chlorhexidine  15 mL Mouth Rinse BID  . Chlorhexidine Gluconate Cloth  6 each Topical Daily  . cyclobenzaprine  5 mg Oral BID   And  . cyclobenzaprine  10 mg Oral QHS  . diazepam  2 mg Intravenous BID  . erythromycin  250 mg Oral TID  . famotidine  40 mg Oral QHS  . liver oil-zinc oxide   Topical QID  . mouth rinse  15 mL Mouth Rinse BID  . metoprolol succinate  100 mg Oral Daily  . sodium chloride flush  10-40 mL Intracatheter Q12H  . sodium chloride flush  10-40 mL Intracatheter Q12H  . tiZANidine  2 mg Oral TID  . Warfarin - Pharmacist Dosing  Inpatient   Does not apply q1800    Assessment: 37 year old male with cerebral palsy. He was admitted with abdominal pain due to a SBO. He went for an ex lap which only showed a dilated small intestine without adhesions. He is currently on TPN while awaiting recovery of bowel function. 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 His INR and warfarin were reversed upon admission for surgery. He remains on heparin while coumadin on hold. Swallow eval done 11/29 and may resume coumadin today (he is noted on erythromycin which may increase coumadin sensitivity)  Heparin level this morning 0.2 units/ml.  No issues noted with infusion  Goal of Therapy:  Heparin level 0.3-0.7 units/ml  Monitor platelets by anticoagulation protocol: Yes  Plan:  -Increase heparin to 1800 units/hr -Daily heparin level and CBC -Daily PT/INR

## 2017-12-28 NOTE — Discharge Summary (Signed)
Physician Discharge Summary  [name] ZJI:[id number] DOB: 1980/01/30 DOA: 12/14/2017  PCP: [name], MD  Admit date: 12/14/2017 Discharge date: 12/28/2017  Admitted From: Home Disposition:  Home  Discharge Condition:Stable CODE STATUS:FULL Diet recommendation: Heart Healthy   Brief/Interim Summary: Patient is a 37 year old male with past medical history of cerebral palsy, DVT on Coumadin, hypertension, chronic constipation currently living at home with caregiver who presented to the hospital with complaints of nausea, vomiting and abdominal pain.  CT imaging showed small bowel obstruction.  There was also concern for ischemic bowel as he presented with high level of lactic acid.  He underwent laparotomy on 12/15/2017 and was found to have diffusely dilated small bowel without transition point and a small amount of adhesion but no wall ischemia.  He was initially transferred to ICU and was intubated after surgery and was successfully extubated.  Patient had a prolonged postop ileus so TPN was started. Started on oral diet and he has tolerated and he has been having bowel movements. Surgery has cleared him for discharge.  Significant Events: 11/17 - admit to [facility] 11/18 - Exploratory Laparotomy with lysis of adhesions 11/18 - Desaturation into 50s and initially unresponsive, now intubated and sedated 11/18 - Central Line placed, TPN started 11/19 - Foley placed requiring Urology 11/19 - UCx grew >100k Pseudomonas Aerg. 11/19 - Bil. Duplex U/S of LE neg for DVTs 11/20 - EEG: slowly most likely due to sedating medications; no signs of seizure activity 11/26 - extubated  11/28 - [facility] assumed care  Following problems were addressed during his hospitalization:  Small bowel obstruction/ileus: Status post laparotomy.  Had prolonged post op ileus and was receiving TPN.  Surgery was following. Patient has been started on diet now.  He has been having bowel movements.  General  surgery cleared him for discharge to home.  He will follow-up with general surgery as an outpatient.  Cerebral palsy/neurogenic bladder/spasticity: Continue supportive care.    New home medications  Dysphagia: Secondary to prolonged intubation.  Speech was following.  Started on diet and he has been tolerating.  Pseudomonas UTI: Completed course of IV antibiotic  Hypertension: Continue current home medications.  Sinus tachycardia: Much improved after  resuming his home medications.On metoprolol succinate.  History of DVT/proximal A. fib: On Coumadin at home. Needs to regularly  monitor his INR as before.  Elevated LFT: His liver function tests were normal before the surgery.  Most likely he has a mild elevation of liver enzymes secondary to surgery.  Will recommend to check liver function test in 4 weeks as an outpatient.   Discharge Diagnoses:  Principal Problem:   Small bowel obstruction ([place]) Active Problems:   Infantile cerebral palsy (HCC)   Atrial fibrillation (HCC)   Chronic constipation   Hypertension   Spasticity   Neurogenic bladder   Lactic acidosis   Anticoagulated   Respiratory failure (HCC)   Encephalopathy acute   Pressure injury of skin    Discharge Instructions  Discharge Instructions    Diet - low sodium heart healthy   Complete by:  As directed    Discharge instructions   Complete by:  As directed    1) Follow up with your PCP in a week.  Do a CBC, BMP test during the follow-up.  Check your INR regularly as before. 2)Follow up with general surgery as an outpatient in 2 weeks.   Increase activity slowly   Complete by:  As directed      Allergies as of  12/28/2017      Reactions   Betadine [povidone Iodine] Other (See Comments)   Unknown reaction per caregiver   Ciprofloxacin Other (See Comments)   Unknown reaction per caregiver   Morphine Other (See Comments)   Unknown reaction per caregiver      Medication List    STOP taking these  medications   nitrofurantoin 50 MG capsule Commonly known as:  MACRODANTIN     TAKE these medications   acetaminophen 325 MG tablet Commonly known as:  TYLENOL Take 650 mg by mouth every 6 (six) hours as needed. For pain   baclofen 20 MG tablet Commonly known as:  LIORESAL Take 1 tablet (20 mg total) by mouth 4 (four) times daily.   calcium carbonate 500 MG chewable tablet Commonly known as:  TUMS - dosed in mg elemental calcium Chew 1 tablet by mouth 3 (three) times daily.   cetirizine 10 MG tablet Commonly known as:  ZYRTEC Take 10 mg by mouth daily.   cholecalciferol 1000 units tablet Commonly known as:  VITAMIN D Take 1,000 Units by mouth 2 (two) times daily.   cyclobenzaprine 10 MG tablet Commonly known as:  FLEXERIL 1 at 8am and 12pm and 2 tabs at 8pm.   diazepam 2 MG tablet Commonly known as:  VALIUM Take 1 tablet (2 mg total) by mouth 2 (two) times daily. Must last 30 days.   diphenhydrAMINE 25 MG tablet Commonly known as:  BENADRYL Take 25 mg by mouth every 6 (six) hours as needed for allergies.   fluticasone 27.5 MCG/SPRAY nasal spray Commonly known as:  VERAMYST Place 1 spray into the nose at bedtime as needed for rhinitis.   hydrochlorothiazide 25 MG tablet Commonly known as:  HYDRODIURIL Take 12.5 mg by mouth daily.   loperamide 2 MG capsule Commonly known as:  IMODIUM Take 2 mg by mouth 3 (three) times daily as needed for diarrhea or loose stools. Reported on 05/11/2015   metoprolol succinate 100 MG 24 hr tablet Commonly known as:  TOPROL-XL Take 100 mg by mouth daily.   mineral oil enema Place 1 enema rectally as needed for severe constipation.   multivitamin with minerals Tabs tablet Take 1 tablet by mouth daily.   senna-docusate 8.6-50 MG tablet Commonly known as:  Senokot-S Take 3 tablets by mouth at bedtime.   tizanidine 2 MG capsule Commonly known as:  ZANAFLEX Take 1 capsule (2 mg total) by mouth 3 (three) times daily.   warfarin  3 MG tablet Commonly known as:  COUMADIN Take 3 mg by mouth daily at 6 PM.      Follow-up Information    [facility] Follow up in 2 week(s).   Specialty:  General Surgery Contact information: [address] [place] [phone]       [name], MD. Schedule an appointment as soon as possible for a visit in 1 week(s).   Specialty:  Internal Medicine Contact information: [place] [address] [phone]          Allergies  Allergen Reactions  . Betadine [Povidone Iodine] Other (See Comments)    Unknown reaction per caregiver  . Ciprofloxacin Other (See Comments)    Unknown reaction per caregiver  . Morphine Other (See Comments)    Unknown reaction per caregiver    Consultations:  PCCM, general surgery   Procedures/Studies: Dg Chest 1 View  Result Date: 12/15/2017 CLINICAL DATA:  37 year old male with history of central  line placement. EXAM: CHEST  1 VIEW COMPARISON:  Chest x-ray 12/15/2017. FINDINGS: An endotracheal tube is in place with tip 3.0 cm above the carina. There is a right-sided internal jugular central venous catheter with tip terminating in the superior cavoatrial junction. Nasogastric tube extends into the proximal stomach. Lung volumes are low. Opacity in the left base with blunting of the left hemidiaphragm. Right lung appears clear. No pneumothorax. No evidence of pulmonary edema. Heart size is normal. The patient is rotated to the left on today's exam, resulting in distortion of the mediastinal contours and reduced diagnostic sensitivity and specificity for mediastinal pathology. IMPRESSION: 1. Support apparatus, as above. 2. Low lung volumes with left basilar opacity which likely reflects a small left pleural effusion as well as left lower lobe atelectasis and/or airspace consolidation. Electronically Signed   By: Vinnie Langton M.D.   On: 12/15/2017 16:47   Ct  Abdomen Pelvis W Contrast  Result Date: 12/14/2017 CLINICAL DATA:  Acute abdominal pain, nausea, vomiting. EXAM: CT ABDOMEN AND PELVIS WITH CONTRAST TECHNIQUE: Multidetector CT imaging of the abdomen and pelvis was performed using the standard protocol following bolus administration of intravenous contrast. CONTRAST:  160mL OMNIPAQUE IOHEXOL 300 MG/ML  SOLN COMPARISON:  06/07/2011 FINDINGS: Lower chest: Cardiomegaly.  Bibasilar atelectasis.  No effusions. Hepatobiliary: To gallstones within the gallbladder, the largest 1.8 cm. No focal hepatic abnormality. Pancreas: No focal abnormality or ductal dilatation. Spleen: No focal abnormality.  Normal size. Adrenals/Urinary Tract: No adrenal abnormality. No focal renal abnormality. No stones or hydronephrosis. Urinary bladder is unremarkable. Stomach/Bowel: Large stool burden within the rectosigmoid colon. Dilated small bowel loops in the abdomen and pelvis. The distal ileum is decompressed. Findings compatible with distal small bowel obstruction. Exact cause or transition point is not visualized. Stomach is mildly dilated. Vascular/Lymphatic: No evidence of aneurysm or adenopathy. Reproductive: Mildly prominent prostate. Other: No free fluid or free air. Musculoskeletal: No acute bony abnormality. IMPRESSION: Dilated small bowel with air-fluid levels to the distal small bowel compatible with distal small bowel obstruction. Exact cause and transition point not visualized. Large stool burden in the rectosigmoid colon. Mildly prominent prostate. Cholelithiasis. Bibasilar atelectasis. Electronically Signed   By: Rolm Baptise M.D.   On: 12/14/2017 19:02   Dg Chest Port 1 View  Result Date: 12/24/2017 CLINICAL DATA:  Follow-up endotracheal tube EXAM: PORTABLE CHEST 1 VIEW COMPARISON:  12/22/2017 FINDINGS: Endotracheal tube has been removed in the interval. Nasogastric catheter and right jugular central line are again seen. Patient is significantly rotated to the left  accentuating the mediastinal markings. Persistent opacity in the left base is again noted and stable. No other focal infiltrate is seen. IMPRESSION: No significant interval change with the exception of interval removal of endotracheal tube. Electronically Signed   By: Inez Catalina M.D.   On: 12/24/2017 07:47   Dg Chest Port 1 View  Result Date: 12/22/2017 CLINICAL DATA:  Check endotracheal tube placement EXAM: PORTABLE CHEST 1 VIEW COMPARISON:  Film from earlier in the same day. FINDINGS: Endotracheal tube, nasogastric catheter and right jugular central line are again seen and stable. Overall inspiratory effort is poor with mild right basilar atelectasis stable from the prior exam. Persistent left basilar changes are noted as well although mildly improved when compared with the prior exam. No bony abnormality is seen. IMPRESSION: Poor inspiratory effort although slight improvement in the degree of aeration in the left base. Electronically Signed   By: Inez Catalina M.D.   On: 12/22/2017 16:45  Dg Chest Portable 1 View  Result Date: 12/22/2017 CLINICAL DATA:  Hypoxia EXAM: PORTABLE CHEST 1 VIEW COMPARISON:  December 21, 2017 FINDINGS: Endotracheal tube tip is 4.3 cm above the carina. Nasogastric tube tip and side port are below the diaphragm. No pneumothorax. There is airspace consolidation throughout the left lower lobe. There are left pleural effusions. Heart is upper normal in size with pulmonary vascularity normal. No adenopathy. No bone lesions. IMPRESSION: Tube positions as described without pneumothorax. Left lower lobe consolidation. There are fairly small pleural effusions bilaterally. Stable cardiac silhouette. Electronically Signed   By: Lowella Grip III M.D.   On: 12/22/2017 07:20   Dg Chest Portable 1 View  Result Date: 12/21/2017 CLINICAL DATA:  Respiratory failure EXAM: PORTABLE CHEST 1 VIEW COMPARISON:  12/20/2017 and prior exams FINDINGS: An endotracheal tube, NG tube and RIGHT IJ  central venous catheter are unchanged. This is a very low volume film. Continued RIGHT basilar atelectasis and LEFT LOWER lung consolidation/atelectasis noted. There is no evidence of pneumothorax. Little significant change noted from the prior study. IMPRESSION: Unchanged appearance of the chest with RIGHT basilar atelectasis and LEFT LOWER lung consolidation/atelectasis. Electronically Signed   By: Margarette Canada M.D.   On: 12/21/2017 07:53   Dg Chest Portable 1 View  Result Date: 12/20/2017 CLINICAL DATA:  Patient on the ventilator. EXAM: PORTABLE CHEST 1 VIEW COMPARISON:  Chest radiograph 12/19/2017 FINDINGS: ET tube mid trachea. Monitoring leads overlie the patient. Right IJ central venous catheter tip projects over the superior vena cava. Cardiomegaly. Low lung volumes. Bibasilar opacities. Probable small bilateral pleural effusions. No pneumothorax. IMPRESSION: Support apparatus as above. Low lung volumes with basilar atelectasis and small effusions. Electronically Signed   By: Lovey Newcomer M.D.   On: 12/20/2017 08:36   Portable Chest X-ray  Result Date: 12/19/2017 CLINICAL DATA:  Post intubation EXAM: PORTABLE CHEST 1 VIEW COMPARISON:  12/16/2017 FINDINGS: Endotracheal tube is at the level of the carina. Very low lung volumes with bibasilar atelectasis. Possible layering effusions. Heart is borderline in size. NG tube is in the stomach. IMPRESSION: Endotracheal tube near the level of the carina. Very low lung volumes with bibasilar atelectasis and suspected layering effusions. Electronically Signed   By: Rolm Baptise M.D.   On: 12/19/2017 10:29   Dg Chest Port 1 View  Result Date: 12/16/2017 CLINICAL DATA:  Respiratory failure, shortness of breath. EXAM: PORTABLE CHEST 1 VIEW COMPARISON:  Radiograph of December 05, 2017. FINDINGS: Stable cardiomegaly. Endotracheal and nasogastric tubes are unchanged in position. Right internal jugular catheter is unchanged in position. No pneumothorax is noted.  Stable bibasilar atelectasis is noted. Bony thorax is unremarkable. IMPRESSION: Stable support apparatus.  Stable bibasilar opacities. Electronically Signed   By: Marijo Conception, M.D.   On: 12/16/2017 07:14   Portable Chest X-ray  Result Date: 12/15/2017 CLINICAL DATA:  Ventilator patient. EXAM: PORTABLE CHEST 1 VIEW COMPARISON:  12/14/2017 and 11/14/2011. FINDINGS: 0934 hours. Interval intubation. The tip of the endotracheal tube is approximately 1.8 cm above the carina. Enteric tube projects into the stomach. There is overall improved aeration of the lung bases with mild left-greater-than-right probable atelectasis. No pleural effusion or pneumothorax. Heart size and mediastinal contours are stable. There is a convex right thoracic scoliosis. IMPRESSION: Endotracheal tube tip 1.8 cm above the carina. This could be retracted 2 cm for more optimal positioning. Enteric tube extends into the stomach. Interval improved aeration of the lung bases with probable left-greater-than-right basilar atelectasis. Electronically Signed   By:  Richardean Sale M.D.   On: 12/15/2017 09:51   Dg Chest Port 1 View  Result Date: 12/14/2017 CLINICAL DATA:  Tachypnea EXAM: PORTABLE CHEST 1 VIEW COMPARISON:  11/14/2011 FINDINGS: Cardiomegaly. Vascular congestion. Very low lung volumes with bibasilar atelectasis. No definite visible effusions or acute bony abnormality. IMPRESSION: Very low lung volumes with cardiomegaly, vascular congestion and bibasilar atelectasis. Electronically Signed   By: Rolm Baptise M.D.   On: 12/14/2017 18:14   Dg Abd Portable 1v  Result Date: 12/19/2017 CLINICAL DATA:  NG tube placement EXAM: PORTABLE ABDOMEN - 1 VIEW COMPARISON:  12/17/2017 FINDINGS: Enteric tube tip is in the left upper quadrant consistent with location in the body of the stomach. IMPRESSION: Enteric tube tip is in the left upper quadrant consistent with location in the body of the stomach. Electronically Signed   By: Lucienne Capers M.D.   On: 12/19/2017 00:08   Dg Abd Portable 1v  Result Date: 12/17/2017 CLINICAL DATA:  Assess nasogastric tube placement EXAM: PORTABLE ABDOMEN - 1 VIEW COMPARISON:  Abdominal radiograph of December 15, 2017 FINDINGS: The esophagogastric tube tip and proximal port project in the proximal gastric body and gastric cardia respectively. There remain loops of moderately distended bowel in the mid abdomen. The visualized portions of the left colon and rectum appear decompressed with a small amount of stool present. IMPRESSION: The esophagogastric tube tip and proximal port project in the gastric cardia-proximal body. Electronically Signed   By: David  Martinique M.D.   On: 12/17/2017 12:25   Dg Abd Portable 1v  Result Date: 12/15/2017 CLINICAL DATA:  NG tube placement EXAM: PORTABLE ABDOMEN - 1 VIEW COMPARISON:  None. FINDINGS: Enteric tube is been placed. Tip is at the expected location of the EG junction, likely in the distal esophagus. Gaseous distention of visualized small bowel. Skin clips along the midline consistent with recent surgery. Calcifications in the right upper quadrant likely gallstones. IMPRESSION: Enteric tube tip is at the expected location of the EG junction, likely in the distal esophagus. Electronically Signed   By: Lucienne Capers M.D.   On: 12/15/2017 05:50   Vas [place] Lower Extremity Venous (dvt)  Result Date: 12/21/2017  Lower Venous Study Indications: Edema, and Swelling. Other Indications: Known Left AVM. Limitations: Body habitus, poor ultrasound/tissue interface and patient positioning. Performing Technologist: Abram Sander RVS  Examination Guidelines: A complete evaluation includes B-mode imaging, spectral Doppler, color Doppler, and power Doppler as needed of all accessible portions of each vessel. Bilateral testing is considered an integral part of a complete examination. Limited examinations for reoccurring indications may be performed as noted.  Right Venous  Findings: +---------+---------------+---------+-----------+----------+--------------+          CompressibilityPhasicitySpontaneityPropertiesSummary        +---------+---------------+---------+-----------+----------+--------------+ CFV      Full           Yes      Yes                                 +---------+---------------+---------+-----------+----------+--------------+ SFJ      Full                                                        +---------+---------------+---------+-----------+----------+--------------+ FV Prox  Full                                                        +---------+---------------+---------+-----------+----------+--------------+  FV Mid   Full                                                        +---------+---------------+---------+-----------+----------+--------------+ FV DistalFull                                                        +---------+---------------+---------+-----------+----------+--------------+ PFV      Full                                                        +---------+---------------+---------+-----------+----------+--------------+ POP      Full           Yes      Yes                                 +---------+---------------+---------+-----------+----------+--------------+ PTV                                                   not visualized +---------+---------------+---------+-----------+----------+--------------+ PERO                                                  not visualized +---------+---------------+---------+-----------+----------+--------------+  Left Venous Findings: +---------+---------------+---------+-----------+----------+--------------+          CompressibilityPhasicitySpontaneityPropertiesSummary        +---------+---------------+---------+-----------+----------+--------------+ CFV      Full           Yes      Yes                  AVM             +---------+---------------+---------+-----------+----------+--------------+ SFJ      Full                                                        +---------+---------------+---------+-----------+----------+--------------+ FV Prox  Full                                                        +---------+---------------+---------+-----------+----------+--------------+ FV Mid                                                not visualized +---------+---------------+---------+-----------+----------+--------------+ FV  Distal                                             not visualized +---------+---------------+---------+-----------+----------+--------------+ POP      Full           Yes      Yes                  limited        +---------+---------------+---------+-----------+----------+--------------+ PTV                                                   not visualized +---------+---------------+---------+-----------+----------+--------------+ PERO                                                  not visualized +---------+---------------+---------+-----------+----------+--------------+    Summary: Right: There is no evidence of deep vein thrombosis in the lower extremity. However, portions of this examination were limited- see technologist comments above. No cystic structure found in the popliteal fossa. Left: There is no evidence of deep vein thrombosis in the lower extremity. However, portions of this examination were limited- see technologist comments above. No cystic structure found in the popliteal fossa.  *See table(s) above for measurements and observations. Electronically signed by Harold Barban MD on 12/21/2017 at 5:31:59 PM.    Final    Vas [place] Lower Extremity Venous (dvt)  Result Date: 12/16/2017  Lower Venous Study Indications: Swelling, and warmth.  Limitations: Poor ultrasound/tissue interface and patient position. Comparison Study: 10/17/16 Performing  Technologist: Landry Mellow RDMS, RVT  Examination Guidelines: A complete evaluation includes B-mode imaging, spectral Doppler, color Doppler, and power Doppler as needed of all accessible portions of each vessel. Bilateral testing is considered an integral part of a complete examination. Limited examinations for reoccurring indications may be performed as noted.  Right Venous Findings: +---------+---------------+---------+-----------+----------+-------------------+          CompressibilityPhasicitySpontaneityPropertiesSummary             +---------+---------------+---------+-----------+----------+-------------------+ CFV      Full           Yes      Yes                                      +---------+---------------+---------+-----------+----------+-------------------+ SFJ      Full                                                             +---------+---------------+---------+-----------+----------+-------------------+ FV Prox  Full                                                             +---------+---------------+---------+-----------+----------+-------------------+  FV Mid                           Yes                                      +---------+---------------+---------+-----------+----------+-------------------+ FV Distal                                             not visualized due                                                        to medially turned                                                        leg                 +---------+---------------+---------+-----------+----------+-------------------+ PFV      Full                                                             +---------+---------------+---------+-----------+----------+-------------------+ POP      Full           Yes      Yes                                      +---------+---------------+---------+-----------+----------+-------------------+ PTV                                                    not visualized      +---------+---------------+---------+-----------+----------+-------------------+ PERO                                                  not visualized      +---------+---------------+---------+-----------+----------+-------------------+  Left Venous Findings: +---------+---------------+---------+-----------+----------+-------------------+          CompressibilityPhasicitySpontaneityPropertiesSummary             +---------+---------------+---------+-----------+----------+-------------------+ CFV                     Yes      Yes                  poorly visualized   +---------+---------------+---------+-----------+----------+-------------------+ FV Prox  Full  poorly visualized   +---------+---------------+---------+-----------+----------+-------------------+ FV Mid                                                not visualized      +---------+---------------+---------+-----------+----------+-------------------+ FV Distal                                             not visualized-                                                           poor tissue                                                               interface           +---------+---------------+---------+-----------+----------+-------------------+ POP                     Yes      Yes                                      +---------+---------------+---------+-----------+----------+-------------------+ PTV                                                   poorly visualized   +---------+---------------+---------+-----------+----------+-------------------+ PERO                                                  poorly visualized   +---------+---------------+---------+-----------+----------+-------------------+ AV malformation left groin    Summary: Right: There is no evidence of deep vein  thrombosis in the lower extremity. However, portions of this examination were limited- see technologist comments above. Left: There is no evidence of deep vein thrombosis in the lower extremity. However, portions of this examination were limited- see technologist comments above. 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  *See table(s) above for measurements and observations. Electronically signed by Deitra Mayo MD on 12/16/2017 at 3:53:10 PM.    Final    [place] Ekg Site Rite  Result Date: 12/25/2017 If Site Rite image not attached, placement could not be confirmed due to current cardiac rhythm.      Subjective: Patient seen and examined the bedside this morning.  Remains hemodynamically stable.  Stable for discharge home today.  Discharge Exam: Vitals:   12/28/17 0724 12/28/17 1200  BP: 111/68 109/74  Pulse: 96 100  Resp: (!) 25   Temp: 98.3 F (36.8 C) 98.1 F (36.7 C)  SpO2: 98% 97%   Vitals:  12/28/17 0500 12/28/17 0600 12/28/17 0724 12/28/17 1200  BP:  115/73 111/68 109/74  Pulse:   96 100  Resp:   (!) 25   Temp:  98.2 F (36.8 C) 98.3 F (36.8 C) 98.1 F (36.7 C)  TempSrc:  Axillary Axillary Axillary  SpO2:   98% 97%  Weight:      Height: 5\' 5"  (1.651 m)       General: Pt is alert, awake, not in acute distress Cardiovascular: RRR, S1/S2 +, no rubs, no gallops Respiratory: CTA bilaterally, no wheezing, no rhonchi Abdominal: Soft, NT, ND, bowel sounds +,clean surgical incision scar with staples Extremities: no edema, no cyanosis    The results of significant diagnostics from this hospitalization (including imaging, microbiology, ancillary and laboratory) are listed below for reference.     Microbiology: No results found for this or any previous visit (from the past 240 hour(s)).   Labs: BNP (last 3 results) No results for input(s): BNP in the last 8760 hours. Basic Metabolic Panel: Recent Labs  Lab 12/22/17 0506 12/23/17 0502 12/24/17 0418  12/25/17 0917 12/26/17 0623 12/28/17 0413  NA 135 137 141 142 145 140  K 3.9 3.9 3.9 4.2 4.3 4.1  CL 100 99 104 105 111 105  CO2 30 31 32 27 27 26   GLUCOSE 142* 137* 121* 108* 115* 90  BUN 15 19 23* 25* 22* 17  CREATININE 0.67 0.77 0.78 0.86 0.85 0.85  CALCIUM 8.0* 8.5* 8.7* 8.8* 9.0 8.6*  MG 2.1 2.0 2.1 2.0  --   --   PHOS 3.1 4.1 3.9 3.7  --   --    Liver Function Tests: Recent Labs  Lab 12/22/17 0506 12/24/17 0418 12/25/17 0917 12/28/17 0413  AST 27 146* 96* 55*  ALT 23 102* 126* 123*  ALKPHOS 53 198* 173* 89  BILITOT 0.4 2.6* 0.9 0.6  PROT 5.1* 5.6* 6.1* 6.1*  ALBUMIN 2.2* 2.4* 2.8* 2.8*   No results for input(s): LIPASE, AMYLASE in the last 168 hours. No results for input(s): AMMONIA in the last 168 hours. CBC: Recent Labs  Lab 12/22/17 0506 12/23/17 0502 12/24/17 0418 12/26/17 0623 12/27/17 0504 12/28/17 0413  WBC 5.9 8.4 6.1 8.9 10.3 7.9  NEUTROABS 3.9  --   --   --   --   --   HGB 10.4* 10.9* 10.4* 11.0* 11.8* 10.9*  HCT 33.2* 34.1* 33.6* 37.5* 37.8* 35.5*  MCV 90.2 91.4 92.3 95.4 92.6 94.9  PLT 270 291 280 414* 395 356   Cardiac Enzymes: Recent Labs  Lab 12/24/17 1704  CKTOTAL 95   BNP: Invalid input(s): POCBNP CBG: Recent Labs  Lab 12/27/17 1957 12/28/17 0025 12/28/17 0555 12/28/17 0722 12/28/17 1153  GLUCAP 96 103* 91 90 101*   D-Dimer No results for input(s): DDIMER in the last 72 hours. Hgb A1c No results for input(s): HGBA1C in the last 72 hours. Lipid Profile No results for input(s): CHOL, HDL, LDLCALC, TRIG, CHOLHDL, LDLDIRECT in the last 72 hours. Thyroid function studies No results for input(s): TSH, T4TOTAL, T3FREE, THYROIDAB in the last 72 hours.  Invalid input(s): FREET3 Anemia work up No results for input(s): VITAMINB12, FOLATE, FERRITIN, TIBC, IRON, RETICCTPCT in the last 72 hours. Urinalysis    Component Value Date/Time   COLORURINE AMBER (A) 12/14/2017 1948   APPEARANCEUR CLEAR 12/14/2017 1948   LABSPEC >1.046  (H) 12/14/2017 1948   PHURINE 5.0 12/14/2017 1948   GLUCOSEU NEGATIVE 12/14/2017 1948   HGBUR NEGATIVE 12/14/2017 1948   [place]  NEGATIVE 12/14/2017 1948   BILIRUBINUR negative 05/11/2015 1041   KETONESUR 20 (A) 12/14/2017 1948   PROTEINUR 100 (A) 12/14/2017 1948   UROBILINOGEN 1.0 05/11/2015 1041   UROBILINOGEN 0.2 06/03/2011 1809   NITRITE NEGATIVE 12/14/2017 1948   LEUKOCYTESUR NEGATIVE 12/14/2017 1948   Sepsis Labs Invalid input(s): PROCALCITONIN,  WBC,  LACTICIDVEN Microbiology No results found for this or any previous visit (from the past 240 hour(s)).  Please note: You were cared for by a hospitalist during your hospital stay. Once you are discharged, your primary care physician will handle any further medical issues. Please note that NO REFILLS for any discharge medications will be authorized once you are discharged, as it is imperative that you return to your primary care physician (or establish a relationship with a primary care physician if you do not have one) for your post hospital discharge needs so that they can reassess your need for medications and monitor your lab values.    Time coordinating discharge: 40 minutes  SIGNED:   Shelly Coss, MD  [facility] 12/28/2017, 12:01 PM Pager [phone]  If 7PM-7AM, please contact night-coverage [email or web address] Password [email or web address]
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
## 2018-02-14 ENCOUNTER — Inpatient Hospital Stay (HOSPITAL_COMMUNITY)
Admission: EM | Admit: 2018-02-14 | Discharge: 2018-02-18 | DRG: 388 | Disposition: A | Discharge status: Home / Self Care | Payer: Medicare Other | Attending: Internal Medicine | Admitting: Internal Medicine

## 2018-02-14 ENCOUNTER — Emergency Department (HOSPITAL_COMMUNITY): Payer: Medicare Other

## 2018-02-14 ENCOUNTER — Encounter (HOSPITAL_COMMUNITY): Payer: Self-pay | Admitting: Radiology

## 2018-02-14 DIAGNOSIS — D689 Coagulation defect, unspecified: Secondary | ICD-10-CM | POA: Diagnosis present

## 2018-02-14 DIAGNOSIS — G4700 Insomnia, unspecified: Secondary | ICD-10-CM | POA: Diagnosis present

## 2018-02-14 DIAGNOSIS — G801 Spastic diplegic cerebral palsy: Secondary | ICD-10-CM | POA: Diagnosis present

## 2018-02-14 DIAGNOSIS — K5900 Constipation, unspecified: Secondary | ICD-10-CM | POA: Diagnosis not present

## 2018-02-14 DIAGNOSIS — E860 Dehydration: Secondary | ICD-10-CM | POA: Diagnosis present

## 2018-02-14 DIAGNOSIS — J189 Pneumonia, unspecified organism: Secondary | ICD-10-CM | POA: Insufficient documentation

## 2018-02-14 DIAGNOSIS — K56600 Partial intestinal obstruction, unspecified as to cause: Secondary | ICD-10-CM | POA: Diagnosis not present

## 2018-02-14 DIAGNOSIS — E872 Acidosis, unspecified: Secondary | ICD-10-CM | POA: Diagnosis present

## 2018-02-14 DIAGNOSIS — J690 Pneumonitis due to inhalation of food and vomit: Secondary | ICD-10-CM

## 2018-02-14 DIAGNOSIS — Z79899 Other long term (current) drug therapy: Secondary | ICD-10-CM | POA: Diagnosis not present

## 2018-02-14 DIAGNOSIS — Z881 Allergy status to other antibiotic agents status: Secondary | ICD-10-CM

## 2018-02-14 DIAGNOSIS — R682 Dry mouth, unspecified: Secondary | ICD-10-CM | POA: Diagnosis present

## 2018-02-14 DIAGNOSIS — Z7901 Long term (current) use of anticoagulants: Secondary | ICD-10-CM

## 2018-02-14 DIAGNOSIS — K8020 Calculus of gallbladder without cholecystitis without obstruction: Secondary | ICD-10-CM | POA: Diagnosis present

## 2018-02-14 DIAGNOSIS — Z87440 Personal history of urinary (tract) infections: Secondary | ICD-10-CM

## 2018-02-14 DIAGNOSIS — I4821 Permanent atrial fibrillation: Secondary | ICD-10-CM | POA: Diagnosis present

## 2018-02-14 DIAGNOSIS — K567 Ileus, unspecified: Secondary | ICD-10-CM | POA: Diagnosis present

## 2018-02-14 DIAGNOSIS — Z5329 Procedure and treatment not carried out because of patient's decision for other reasons: Secondary | ICD-10-CM | POA: Diagnosis present

## 2018-02-14 DIAGNOSIS — Z885 Allergy status to narcotic agent status: Secondary | ICD-10-CM | POA: Diagnosis not present

## 2018-02-14 DIAGNOSIS — R471 Dysarthria and anarthria: Secondary | ICD-10-CM | POA: Diagnosis present

## 2018-02-14 DIAGNOSIS — I480 Paroxysmal atrial fibrillation: Secondary | ICD-10-CM | POA: Diagnosis not present

## 2018-02-14 DIAGNOSIS — Z888 Allergy status to other drugs, medicaments and biological substances status: Secondary | ICD-10-CM | POA: Diagnosis not present

## 2018-02-14 DIAGNOSIS — K5909 Other constipation: Secondary | ICD-10-CM | POA: Diagnosis present

## 2018-02-14 DIAGNOSIS — N319 Neuromuscular dysfunction of bladder, unspecified: Secondary | ICD-10-CM | POA: Diagnosis present

## 2018-02-14 DIAGNOSIS — I10 Essential (primary) hypertension: Secondary | ICD-10-CM | POA: Diagnosis present

## 2018-02-14 DIAGNOSIS — L89152 Pressure ulcer of sacral region, stage 2: Secondary | ICD-10-CM | POA: Diagnosis present

## 2018-02-14 DIAGNOSIS — R252 Cramp and spasm: Secondary | ICD-10-CM | POA: Diagnosis present

## 2018-02-14 DIAGNOSIS — R000 Tachycardia, unspecified: Secondary | ICD-10-CM | POA: Diagnosis present

## 2018-02-14 DIAGNOSIS — I4891 Unspecified atrial fibrillation: Secondary | ICD-10-CM | POA: Diagnosis present

## 2018-02-14 HISTORY — DX: Acute embolism and thrombosis of unspecified deep veins of unspecified lower extremity: I82.409

## 2018-02-14 LAB — CBC WITH DIFFERENTIAL/PLATELET
Abs Immature Granulocytes: 0.02 10*3/uL (ref 0.00–0.07)
BASOS PCT: 0 %
Basophils Absolute: 0 10*3/uL (ref 0.0–0.1)
Eosinophils Absolute: 0.1 10*3/uL (ref 0.0–0.5)
Eosinophils Relative: 1 %
HCT: 45.1 % (ref 39.0–52.0)
Hemoglobin: 14.2 g/dL (ref 13.0–17.0)
Immature Granulocytes: 0 %
Lymphocytes Relative: 10 %
Lymphs Abs: 1 10*3/uL (ref 0.7–4.0)
MCH: 28.6 pg (ref 26.0–34.0)
MCHC: 31.5 g/dL (ref 30.0–36.0)
MCV: 90.9 fL (ref 80.0–100.0)
Monocytes Absolute: 1 10*3/uL (ref 0.1–1.0)
Monocytes Relative: 9 %
NEUTROS ABS: 8.3 10*3/uL — AB (ref 1.7–7.7)
Neutrophils Relative %: 80 %
PLATELETS: 299 10*3/uL (ref 150–400)
RBC: 4.96 MIL/uL (ref 4.22–5.81)
RDW: 14.5 % (ref 11.5–15.5)
WBC: 10.4 10*3/uL (ref 4.0–10.5)
nRBC: 0 % (ref 0.0–0.2)

## 2018-02-14 LAB — URINALYSIS, ROUTINE W REFLEX MICROSCOPIC
Bacteria, UA: NONE SEEN
Glucose, UA: NEGATIVE mg/dL
Hgb urine dipstick: NEGATIVE
Ketones, ur: 80 mg/dL — AB
Leukocytes, UA: NEGATIVE
Nitrite: NEGATIVE
PROTEIN: 30 mg/dL — AB
Specific Gravity, Urine: 1.032 — ABNORMAL HIGH (ref 1.005–1.030)
pH: 5 (ref 5.0–8.0)

## 2018-02-14 LAB — COMPREHENSIVE METABOLIC PANEL
ALT: 18 U/L (ref 0–44)
ANION GAP: 17 — AB (ref 5–15)
AST: 21 U/L (ref 15–41)
Albumin: 3.7 g/dL (ref 3.5–5.0)
Alkaline Phosphatase: 60 U/L (ref 38–126)
BUN: 12 mg/dL (ref 6–20)
CO2: 22 mmol/L (ref 22–32)
Calcium: 9.3 mg/dL (ref 8.9–10.3)
Chloride: 102 mmol/L (ref 98–111)
Creatinine, Ser: 1.18 mg/dL (ref 0.61–1.24)
GFR calc Af Amer: >60 mL/min (ref >60)
GFR calc non Af Amer: >60 mL/min (ref >60)
Glucose, Bld: 138 mg/dL — ABNORMAL HIGH (ref 70–99)
POTASSIUM: 3.9 mmol/L (ref 3.5–5.1)
Sodium: 141 mmol/L (ref 135–145)
TOTAL PROTEIN: 7.3 g/dL (ref 6.5–8.1)
Total Bilirubin: 1.6 mg/dL — ABNORMAL HIGH (ref 0.3–1.2)

## 2018-02-14 LAB — I-STAT CHEM 8, ED
BUN: 12 mg/dL (ref 6–20)
Calcium, Ion: 1.04 mmol/L — ABNORMAL LOW (ref 1.15–1.40)
Chloride: 104 mmol/L (ref 98–111)
Creatinine, Ser: 0.9 mg/dL (ref 0.61–1.24)
Glucose, Bld: 134 mg/dL — ABNORMAL HIGH (ref 70–99)
HCT: 44 % (ref 39.0–52.0)
Hemoglobin: 15 g/dL (ref 13.0–17.0)
Potassium: 3.8 mmol/L (ref 3.5–5.1)
SODIUM: 139 mmol/L (ref 135–145)
TCO2: 23 mmol/L (ref 22–32)

## 2018-02-14 LAB — I-STAT CG4 LACTIC ACID, ED
Lactic Acid, Venous: 2.58 mmol/L (ref 0.5–1.9)
Lactic Acid, Venous: 1.33 mmol/L (ref 0.5–1.9)

## 2018-02-14 LAB — PROTIME-INR
INR: 1.27
Prothrombin Time: 15.8 seconds — ABNORMAL HIGH (ref 11.4–15.2)

## 2018-02-14 MED ORDER — HEPARIN SODIUM (PORCINE) 5000 UNIT/ML IJ SOLN
5000.0000 [IU] | Freq: Three times a day (TID) | INTRAMUSCULAR | Status: DC
  Administered 2018-02-14 – 2018-02-15 (×2): 5000 [IU] via SUBCUTANEOUS
  Filled 2018-02-14 (×3): qty 1

## 2018-02-14 MED ORDER — SODIUM CHLORIDE 0.9 % IV SOLN
2.0000 g | Freq: Once | INTRAVENOUS | Status: AC
  Administered 2018-02-14: 2 g via INTRAVENOUS
  Filled 2018-02-14: qty 2

## 2018-02-14 MED ORDER — LABETALOL HCL 5 MG/ML IV SOLN
5.0000 mg | INTRAVENOUS | Status: DC | PRN
  Filled 2018-02-14: qty 4

## 2018-02-14 MED ORDER — ONDANSETRON HCL 4 MG/2ML IJ SOLN: 4.0000 mg | Freq: Four times a day (QID) | INTRAMUSCULAR | Status: DC | PRN

## 2018-02-14 MED ORDER — SODIUM CHLORIDE 0.9 % IV SOLN
2.0000 g | Freq: Three times a day (TID) | INTRAVENOUS | Status: DC
  Administered 2018-02-14 – 2018-02-15 (×2): 2 g via INTRAVENOUS
  Filled 2018-02-14 (×3): qty 2

## 2018-02-14 MED ORDER — FLUTICASONE PROPIONATE 50 MCG/ACT NA SUSP
1.0000 | Freq: Every evening | NASAL | Status: DC | PRN
  Filled 2018-02-14: qty 16

## 2018-02-14 MED ORDER — IOHEXOL 300 MG/ML  SOLN
100.0000 mL | Freq: Once | INTRAMUSCULAR | Status: AC | PRN
  Administered 2018-02-14: 100 mL via INTRAVENOUS

## 2018-02-14 MED ORDER — DIAZEPAM 2.5 MG RE GEL: 2.5000 mg | Freq: Two times a day (BID) | RECTAL | Status: DC | PRN

## 2018-02-14 MED ORDER — LORAZEPAM 2 MG/ML IJ SOLN
0.5000 mg | Freq: Once | INTRAMUSCULAR | Status: AC
  Administered 2018-02-14: 0.5 mg via INTRAVENOUS
  Filled 2018-02-14: qty 1

## 2018-02-14 MED ORDER — DIAZEPAM 2.5 MG RE GEL
2.5000 mg | Freq: Once | RECTAL | Status: DC
  Filled 2018-02-14: qty 2.5

## 2018-02-14 MED ORDER — DIAZEPAM 2 MG PO TABS
2.0000 mg | ORAL_TABLET | Freq: Once | ORAL | Status: AC
  Administered 2018-02-14: 2 mg via ORAL
  Filled 2018-02-14: qty 1

## 2018-02-14 MED ORDER — SODIUM CHLORIDE 0.9 % IV BOLUS
1000.0000 mL | Freq: Once | INTRAVENOUS | Status: AC
  Administered 2018-02-14: 1000 mL via INTRAVENOUS

## 2018-02-14 MED ORDER — FLUTICASONE FUROATE 27.5 MCG/SPRAY NA SUSP: 1.0000 | Freq: Every evening | NASAL | Status: DC | PRN

## 2018-02-14 MED ORDER — SODIUM CHLORIDE 0.9 % IV BOLUS
30.0000 mL/kg | Freq: Once | INTRAVENOUS | Status: AC
  Administered 2018-02-14: 1000 mL via INTRAVENOUS

## 2018-02-14 MED ORDER — METRONIDAZOLE IN NACL 5-0.79 MG/ML-% IV SOLN
500.0000 mg | Freq: Three times a day (TID) | INTRAVENOUS | Status: DC
  Administered 2018-02-14 – 2018-02-15 (×3): 500 mg via INTRAVENOUS
  Filled 2018-02-14 (×3): qty 100

## 2018-02-14 MED ORDER — DIAZEPAM 2.5 MG RE GEL
2.5000 mg | Freq: Once | RECTAL | Status: AC
  Administered 2018-02-14: 2.5 mg via RECTAL

## 2018-02-14 MED ORDER — ONDANSETRON HCL 4 MG/2ML IJ SOLN
4.0000 mg | Freq: Once | INTRAMUSCULAR | Status: AC
  Administered 2018-02-14: 4 mg via INTRAVENOUS
  Filled 2018-02-14: qty 2

## 2018-02-14 MED ORDER — SENNOSIDES-DOCUSATE SODIUM 8.6-50 MG PO TABS
3.0000 | ORAL_TABLET | Freq: Every day | ORAL | Status: DC
  Administered 2018-02-14 – 2018-02-17 (×4): 3 via ORAL
  Filled 2018-02-14 (×4): qty 3

## 2018-02-14 NOTE — ED Notes (Signed)
Pt is difficult IV start. IV team paged stat. Unable to start IV antibiotics at this time.

## 2018-02-14 NOTE — H&P (Signed)
History and Physical  [name] TMH:[id number] DOB: [birth year] DOA: 02/14/2018  Referring physician:Rees, Benjamine Mola, MD PCP: Michael Ebbs, MD  [facility] Patient coming from: Home & is   NOTable to ambulate due to cerebral palsy  Chief Complaint: Nausea vomiting abdominal pain  HPI: [name] is a 38 y.o. male with medical history significant for cerebral palsy chronic constipation coagulopathy hypertension insomnia recurrent UTI tachycardia versus atrial fibrillation on anticoagulation.  He also had recent small bowel obstruction with laparotomy around November 2019, he presented today emergency department today with nausea vomiting that started and abdominal distention that started the day prior to presentation they denied any fever but currently his temperature is 100.3 on presentation to the emergency department.  He recently had a dental procedure on Thursday during which his Coumadin was held but it was restarted Thursday after the procedure.  Currently patient denies any nausea he had already received Zofran.  History was obtained from caregiver patient who was speaking through his caregiver and emergency department personnel and nurses note was reviewed** (For level 3, the HPI must include 4+ descriptors: Location, Quality, Severity, Duration, Timing, Context, modifying factors, associated signs/symptoms and/or status of 3+ chronic problems.)   ED Course: Patient presented to the emergency department he underwent CT scan of the abdomen, sepsis code was called because his initial lactic acid was 2.58.  Repeat lactic acid like 3 hours later came back down to 1.3 he received several boluses of IV fluid.  He was started on metronidazole IV every 8 hours and cefepime 2 g every 8 hours.  Pharmacy consult was obtained for medication management  Review of Systems: Patient seen and examined patient stated he no longer has nausea but he still has some abdominal pain he is  complaining of dry mouth requesting for something to drink or ice chips.   Review of systems are otherwise negative   Past Medical History:  Diagnosis Date  . Cerebral palsy ([place])   . Chronic constipation   . Coagulopathy ([place])   . Hypertension   . Insomnia   . Recurrent UTI   . Tachycardia    Past Surgical History:  Procedure Laterality Date  . ELBOW SURGERY  09/12/10   left  . INCISE AND DRAIN ABCESS  09/11/10  . LAPAROTOMY N/A 12/15/2017   Procedure: EXPLORATORY LAPAROTOMY;  Surgeon: Michael Brightly Arta Bruce, MD;  Location: [facility];  Service: General;  Laterality: N/A;  . SMALL INTESTINE SURGERY    . TOOTH EXTRACTION  11/22/2011   Procedure: DENTAL RESTORATION/EXTRACTIONS;  Surgeon: Michael Baldy, MD;  Location: [facility];  Service: Dentistry;  Laterality: N/A;  dental restoration and cleaning; NO EXTRACTIONS    Social History:  reports that he has never smoked. He has never used smokeless tobacco. He reports that he does not drink alcohol or use drugs.   Allergies  Allergen Reactions  . Betadine [Povidone Iodine] Other (See Comments)    Unknown reaction per caregiver  . Ciprofloxacin Other (See Comments)    Unknown reaction per caregiver  . Morphine Other (See Comments)    Unknown reaction per caregiver    Family History  Family history unknown: Yes      Prior to Admission medications   Medication Sig Start Date End Date Taking? Authorizing Provider  acetaminophen (TYLENOL) 325 MG tablet Take 650 mg by mouth every 6 (six) hours as needed. For pain    [provider]  baclofen (LIORESAL) 20 MG tablet Take 1 tablet (20 mg total) by  mouth 4 (four) times daily. 07/22/17   Michael Bible, NP  calcium carbonate (TUMS - DOSED IN MG ELEMENTAL CALCIUM) 500 MG chewable tablet Chew 1 tablet by mouth 3 (three) times daily.    [provider]  cetirizine (ZYRTEC) 10 MG tablet Take 10 mg by mouth daily.    [provider]  cholecalciferol (VITAMIN D) 1000  UNITS tablet Take 1,000 Units by mouth 2 (two) times daily.    [provider]  cyclobenzaprine (FLEXERIL) 10 MG tablet 1 at 8am and 12pm and 2 tabs at 8pm. 07/22/17   Michael Bible, NP  diazepam (VALIUM) 2 MG tablet Take 1 tablet (2 mg total) by mouth 2 (two) times daily. Must last 30 days. 07/22/17   Michael Bible, NP  diphenhydrAMINE (BENADRYL) 25 MG tablet Take 25 mg by mouth every 6 (six) hours as needed for allergies.    [provider]  fluticasone (VERAMYST) 27.5 MCG/SPRAY nasal spray Place 1 spray into the nose at bedtime as needed for rhinitis.    [provider]  hydrochlorothiazide (HYDRODIURIL) 25 MG tablet Take 12.5 mg by mouth daily.     [provider]  loperamide (IMODIUM) 2 MG capsule Take 2 mg by mouth 3 (three) times daily as needed for diarrhea or loose stools. Reported on 05/11/2015    [provider]  metoprolol (TOPROL-XL) 100 MG 24 hr tablet Take 100 mg by mouth daily.     [provider]  mineral oil enema Place 1 enema rectally as needed for severe constipation.    [provider]  Multiple Vitamin (MULITIVITAMIN WITH MINERALS) TABS Take 1 tablet by mouth daily.    [provider]  senna-docusate (SENOKOT-S) 8.6-50 MG tablet Take 3 tablets by mouth at bedtime.    [provider]  tizanidine (ZANAFLEX) 2 MG capsule Take 1 capsule (2 mg total) by mouth 3 (three) times daily. 07/22/17   Michael Bible, NP  warfarin (COUMADIN) 3 MG tablet Take 3 mg by mouth daily at 6 PM.     [provider]    Physical Exam: BP 125/75   Pulse (!) 130   Temp (!) 101.3 F (38.5 C) (Rectal)   Resp (!) 27   Ht 5\' 5"  (1.651 m)   Wt 91.9 kg   SpO2 99%   BMI 33.71 kg/m   Exam:  . General: 38 y.o. year-old male well developed well nourished in no acute distress.  Alert and oriented x3. . Cardiovascular: Tachycardic heart rate in the 1 30-1 32 regular rhythm with no rubs or  gallops.  No thyromegaly or JVD noted.   Marland Kitchen Respiratory: Clear to auscultation with no wheezes or rales. Good inspiratory effort. . Abdomen: Healing laparoscopic scar noted slightly firm distended mildly tender hypoactive bowel sounds were heard  x4 quadrants. . Musculoskeletal:TRACE lower extremity edema. 2/4 pulses in all 4 extremities.  Contracted both lower extremities at the ankle . Skin: No ulcerative lesions noted or rashes, . Psychiatry: Mood is appropriate for condition and setting           Labs on Admission:  Basic Metabolic Panel: Recent Labs  Lab 02/14/18 0938 02/14/18 0946  NA 141 139  K 3.9 3.8  CL 102 104  CO2 22  --   GLUCOSE 138* 134*  BUN 12 12  CREATININE 1.18 0.90  CALCIUM 9.3  --    Liver Function Tests: Recent Labs  Lab 02/14/18 0938  AST 21  ALT  18  ALKPHOS 60  BILITOT 1.6*  PROT 7.3  ALBUMIN 3.7   No results for input(s): LIPASE, AMYLASE in the last 168 hours. No results for input(s): AMMONIA in the last 168 hours. CBC: Recent Labs  Lab 02/14/18 0938 02/14/18 0946  WBC 10.4  --   NEUTROABS 8.3*  --   HGB 14.2 15.0  HCT 45.1 44.0  MCV 90.9  --   PLT 299  --    Cardiac Enzymes: No results for input(s): CKTOTAL, CKMB, CKMBINDEX, TROPONINI in the last 168 hours.  BNP (last 3 results) No results for input(s): BNP in the last 8760 hours.  ProBNP (last 3 results) No results for input(s): PROBNP in the last 8760 hours.  CBG: No results for input(s): GLUCAP in the last 168 hours.  Radiological Exams on Admission: Ct Abdomen Pelvis W Contrast  Result Date: 02/14/2018 CLINICAL DATA:  Abdominal distension with nausea vomiting since yesterday. History of constipation. EXAM: CT ABDOMEN AND PELVIS WITH CONTRAST TECHNIQUE: Multidetector CT imaging of the abdomen and pelvis was performed using the standard protocol following bolus administration of intravenous contrast. CONTRAST:  133mL OMNIPAQUE IOHEXOL 300 MG/ML  SOLN COMPARISON:  12/14/2017  FINDINGS: Lower chest: Dependent opacity at the lung bases, right lower lobe most prominently, consistent with atelectasis. Hepatobiliary: Liver normal in size. No mass or focal lesion. There are 2 discrete gallstones. These are stable. No evidence of acute cholecystitis. No bile duct dilation. Pancreas: Unremarkable. No pancreatic ductal dilatation or surrounding inflammatory changes. Spleen: Normal in size without focal abnormality. Adrenals/Urinary Tract: No adrenal masses. Mild bilateral renal cortical thinning. Small nonobstructing stone in the upper pole the right kidney. Two left and 1 tiny right low-density renal masses most likely cysts. No other masses. No hydronephrosis. Normal ureters. Bladder is unremarkable. Stomach/Bowel: There is distended small bowel throughout most of the abdomen as well as moderate gastric distention. Maximum small bowel dilation is 6 cm. And exact transition point is not defined. Colon is normal in caliber, but fluid-filled. The colon is mostly fluid-filled. The rectum and sigmoid demonstrate stool. There is no bowel wall thickening and no adjacent mesenteric inflammation. An anastomosis staple line is noted along the medial margin the lower ascending colon/cecum. Vascular/Lymphatic: No aortic aneurysm or atherosclerosis. 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 No pathologically enlarged lymph nodes. Reproductive: Unremarkable. Other: No ascites.  No hernia. Musculoskeletal: No fracture or acute finding. No osteoblastic or osteolytic lesions. IMPRESSION: 1. Findings are similar to the prior CT. There is significant small bowel distension, without colonic dilation. The exact point of transition is not defined. Findings are consistent with a distal partial small bowel obstruction. 2. No other acute findings within the abdomen or pelvis. 3. 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 4. Stable  gallstones. 5. Right greater than left lung base opacity most consistent with atelectasis. Consider right lower lobe pneumonia or aspiration pneumonitis if there are consistent clinical findings. Electronically Signed   By: Lajean Manes M.D.   On: 02/14/2018 12:29   Dg Chest Port 1 View  Result Date: 02/14/2018 CLINICAL DATA:  Fever and abdominal pain EXAM: PORTABLE CHEST 1 VIEW COMPARISON:  12/24/2017 chest radiograph. FINDINGS: Low lung volumes. Stable cardiomediastinal silhouette with top-normal heart size. No pneumothorax. No pleural effusion. No pulmonary edema. Mild left basilar scarring versus atelectasis, decreased. No acute consolidative airspace disease. Mild gaseous distention of the visualized stomach. Cholelithiasis. IMPRESSION: Low lung  volumes. Mild left basilar scarring versus atelectasis, decreased. Otherwise no active disease in the chest. Mild gaseous distention of the visualized stomach. Cholelithiasis. Electronically Signed   By: Ilona Sorrel M.D.   On: 02/14/2018 11:06    EKG: None  Assessment/Plan Present on Admission: . Constipation . Atrial fibrillation ([place]) . Neurogenic bladder . Spasticity . Sepsis ([place]) . Lactic acidosis . Community acquired pneumonia . Partial small bowel obstruction ([place]) . Tachycardia, unspecified  Principal Problem:   Partial small bowel obstruction ([facility]) Active Problems:   Atrial fibrillation ([facility])   Constipation   Spasticity   Sepsis ([facility])   Neurogenic bladder   Lactic acidosis   Community acquired pneumonia   Tachycardia, unspecified   1.  Nausea and vomiting due to partial small bowel obstruction patient declined NG tube.  We will continue bowel rest with n.p.o. surgery will be consulted if declined him we can start him on some ice chips.  2.  Sepsis with lactic acid of 2.58, chest x-ray reveals atelectasis versus infiltrate.  His white count is normal but he has a fever and he was started on cefepime 2 g every 8 hours as well  as metronidazole  3.  Chronic gallstone nonobstructing  4.  History of chronic constipation his medications are being held due to small bowel obstruction as well as being n.p.o.  5.Spasticity due to cerebral palsy he is on baclofen Flexeril and diazepam baclofen and Flexeril hold he received diazepam rectally and will use that as needed for spasm  6.  Tachycardia/atrial fibrillation patient was on Coumadin but is being held due to n.p.o. his receiving subcu heparin  His metronidazole is on hold due to being n.p.o. he will be started on IV labetalol as needed tachycardia   Severity of Illness: The appropriate patient status for this patient is INPATIENT. Inpatient status is judged to be reasonable and necessary in order to provide the required intensity of service to ensure the patient's safety. The patient's presenting symptoms, physical exam findings, and initial radiographic and laboratory data in the context of their chronic comorbidities is felt to place them at high risk for further clinical deterioration. Furthermore, it is not anticipated that the patient will be medically stable for discharge from the hospital within 2 midnights of admission. The following factors support the patient status of inpatient.   " The patient's presenting symptoms include mild obstruction dehydration nausea and vomiting inability to take by mouth. " The worrisome physical exam findings include dehydration fever. " The initial radiographic and laboratory data are worrisome because of atelectasis versus pneumonia partial small bowel obstruction. " The chronic co-morbidities include cerebral palsy, permanent atrial fibrillation on Coumadin.  Patient cannot take by mouth so she cannot have his Coumadin skin and need intravenous or subacute anticoagulation for DVT prophylaxis.   * I certify that at the point of admission it is my clinical judgment that the patient will require inpatient hospital care spanning  beyond 2 midnights from the point of admission due to high intensity of service, high risk for further deterioration and high frequency of surveillance required.*    DVT prophylaxis: Subcu heparin  Code Status: Full code.  Patient stated through his interpreter that he wants to be put on full life support for 2 weeks and if he is not doing any better he is power of attorney who is his sister would not make the decision as to whether to continue or discontinue ventilation  Family Communication: Caregiver at bedside  Disposition  Plan: Home  Consults called: Surgery  Admission status: INPatient    Cristal Deer MD [facility] Pager [phone]  If 7PM-7AM, please contact night-coverage [email or web address] Password [facility]  02/14/2018, 2:13 PM

## 2018-02-14 NOTE — Progress Notes (Signed)
Pharmacy Antibiotic Note  [name] is a 38 y.o. male admitted on 02/14/2018 with intra-abdominal infection.  Pharmacy has been consulted for Cefepime dosing. Flagyl ordered per MD.   SCr 0.9 with nCrCl ~113 mL/min. Patient is wheelchair bound and may have falsely low SCr.  WBC is within normal limits. Tmax 101.3.  Patient has history of recurrent UTIs including a pan sensitive pseudomonas back in November.   Plan: Cefepime 2g IV every 8 hours.  Continue Flagyl 500mg  IV every 8 hours as ordered.  Monitor renal function, clinical status, and culture results.    Height: 5\' 5"  (165.1 cm) Weight: 202 lb 9.6 oz (91.9 kg) IBW/kg (Calculated) : 61.5  Temp (24hrs), Avg:101.3 F (38.5 C), Min:101.3 F (38.5 C), Max:101.3 F (38.5 C)  Recent Labs  Lab 02/14/18 0938 02/14/18 0946 02/14/18 0947  WBC 10.4  --   --   CREATININE 1.18 0.90  --   LATICACIDVEN  --   --  2.58*    Estimated Creatinine Clearance: 116 mL/min (by C-G formula based on SCr of 0.9 mg/dL).    Allergies  Allergen Reactions  . Betadine [Povidone Iodine] Other (See Comments)    Unknown reaction per caregiver  . Ciprofloxacin Other (See Comments)    Unknown reaction per caregiver  . Morphine Other (See Comments)    Unknown reaction per caregiver    Antimicrobials this admission: Cefepime 1/18 >> Flagyl 1/18 >>  Dose adjustments this admission:   Microbiology results: pending  Thank you for allowing pharmacy to be a part of this patient's care.  Brain Hilts 02/14/2018 11:05 AM

## 2018-02-14 NOTE — ED Notes (Signed)
Critical I-stat lactic acid results 2.58 reported to Ralene Bathe, MD via face to face

## 2018-02-14 NOTE — ED Triage Notes (Signed)
Pt BIB [facility], pt is coming from his own home where he resides with a caregiver. Pt has a pmh of CP. Pt has nausea and vomiting. Vomited twice this morning and once yesterday. EMS reports pt did have his coumadin on hold recently for a dental procedure and restarted it on Thursday. Pt has abdominal distention that started yesterday and no bowel movement since Wednesday. Does have a PMH of a bowel obstruction. Temp 101.3 rectal.
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
## 2018-02-14 NOTE — ED Notes (Signed)
IV team at bedside 

## 2018-02-14 NOTE — Consult Note (Signed)
CC: Consult by ED-P for pSBO  HPI: [name] is a very pleasant 38 y.o. male with hx of CP and is nonambulatory, HTN, recurrent UTIs, atrial fibrillation (?) on anticoagulation - whom presented to the ED 11/2017 with n/v/abdominal discomfort. He was found to have an elevated lactated, leukocytosis and CT demonstrated SBO. He was taken to the OR 12/15/17 for exploration by my partner [name]. Found to have an area in the distal small bowel associated with a spasm but subsequently opened; patent ileocolic anastomosis was found. No clear transition point however was identified. He had a slow recovery with prolonged ileus and was on TPN. He recovered and was discharged tolerating a diet 12/28/17.  He returned to the ED today with a 1d hx of abdominal "cramps" and associated n/v. He reports having undergone a dental procedure 02/12/2018 - his anticoagulation was held during this but restarted following. There is some concern he may have aspirated by the ED team today.  He reports that he has been passing gas both yesterday and today. Last BM was 2d ago. He denies significant abdominal pain; states its actually less than the last time he was here. Describes the pain as crampy, does not radiate; more central upper abdomen than lower.  Tmax 38.5 Lactate initially elevated but normalized after IVF WBC normal  CT demonstrated findings similar to prior CT 11/2017 - small bowel distention without colonic dilation; consistent with a distal partial small bowel obstruction. Possible RLL pna or aspiration pneumonitis    Past Medical History:  Diagnosis Date  . Cerebral palsy ([place])   . Chronic constipation   . Coagulopathy ([place])   . Hypertension   . Insomnia   . Recurrent UTI   . Tachycardia     Past Surgical History:  Procedure Laterality Date  . ELBOW SURGERY  09/12/10   left  . INCISE AND DRAIN ABCESS  09/11/10  . LAPAROTOMY N/A 12/15/2017   Procedure: EXPLORATORY LAPAROTOMY;   Surgeon: Kieth Brightly Arta Bruce, MD;  Location: [facility];  Service: General;  Laterality: N/A;  . SMALL INTESTINE SURGERY    . TOOTH EXTRACTION  11/22/2011   Procedure: DENTAL RESTORATION/EXTRACTIONS;  Surgeon: Marcelo Baldy, MD;  Location: [facility];  Service: Dentistry;  Laterality: N/A;  dental restoration and cleaning; NO EXTRACTIONS    Family History  Family history unknown: Yes    Social:  reports that he has never smoked. He has never used smokeless tobacco. He reports that he does not drink alcohol or use drugs.  Allergies:  Allergies  Allergen Reactions  . Betadine [Povidone Iodine] Other (See Comments)    Unknown reaction per caregiver  . Ciprofloxacin Other (See Comments)    Unknown reaction per caregiver  . Morphine Other (See Comments)    Unknown reaction per caregiver    Medications: I have reviewed the patient's current medications.  Results for orders placed or performed during the hospital encounter of 02/14/18 (from the past 48 hour(s))  Comprehensive metabolic panel     Status: Abnormal   Collection Time: 02/14/18  9:38 AM  Result Value Ref Range   Sodium 141 135 - 145 mmol/L   Potassium 3.9 3.5 - 5.1 mmol/L   Chloride 102 98 - 111 mmol/L   CO2 22 22 - 32 mmol/L   Glucose, Bld 138 (H) 70 - 99 mg/dL   BUN 12 6 - 20 mg/dL   Creatinine, Ser 1.18 0.61 - 1.24 mg/dL   Calcium 9.3 8.9 - 10.3 mg/dL  Total Protein 7.3 6.5 - 8.1 g/dL   Albumin 3.7 3.5 - 5.0 g/dL   AST 21 15 - 41 U/L   ALT 18 0 - 44 U/L   Alkaline Phosphatase 60 38 - 126 U/L   Total Bilirubin 1.6 (H) 0.3 - 1.2 mg/dL   GFR calc non Af Amer >60 >60 mL/min   GFR calc Af Amer >60 >60 mL/min   Anion gap 17 (H) 5 - 15    Comment: Performed at [facility] [address], [place], [place] [address]  CBC WITH DIFFERENTIAL     Status: Abnormal   Collection Time: 02/14/18  9:38 AM  Result Value Ref Range   WBC 10.4 4.0 - 10.5 K/uL   RBC 4.96 4.22 - 5.81 MIL/uL   Hemoglobin 14.2 13.0 - 17.0 g/dL   HCT  45.1 39.0 - 52.0 %   MCV 90.9 80.0 - 100.0 fL   MCH 28.6 26.0 - 34.0 pg   MCHC 31.5 30.0 - 36.0 g/dL   RDW 14.5 11.5 - 15.5 %   Platelets 299 150 - 400 K/uL   nRBC 0.0 0.0 - 0.2 %   Neutrophils Relative % 80 %   Neutro Abs 8.3 (H) 1.7 - 7.7 K/uL   Lymphocytes Relative 10 %   Lymphs Abs 1.0 0.7 - 4.0 K/uL   Monocytes Relative 9 %   Monocytes Absolute 1.0 0.1 - 1.0 K/uL   Eosinophils Relative 1 %   Eosinophils Absolute 0.1 0.0 - 0.5 K/uL   Basophils Relative 0 %   Basophils Absolute 0.0 0.0 - 0.1 K/uL   Immature Granulocytes 0 %   Abs Immature Granulocytes 0.02 0.00 - 0.07 K/uL    Comment: Performed at [facility] [address] [place], [place] [address]  Protime-INR     Status: Abnormal   Collection Time: 02/14/18  9:38 AM  Result Value Ref Range   Prothrombin Time 15.8 (H) 11.4 - 15.2 seconds   INR 1.27     Comment: Performed at [facility] [address], [place], [place] [address]  I-stat Chem 8, ED     Status: Abnormal   Collection Time: 02/14/18  9:46 AM  Result Value Ref Range   Sodium 139 135 - 145 mmol/L   Potassium 3.8 3.5 - 5.1 mmol/L   Chloride 104 98 - 111 mmol/L   BUN 12 6 - 20 mg/dL   Creatinine, Ser 0.90 0.61 - 1.24 mg/dL   Glucose, Bld 134 (H) 70 - 99 mg/dL   Calcium, Ion 1.04 (L) 1.15 - 1.40 mmol/L   TCO2 23 22 - 32 mmol/L   Hemoglobin 15.0 13.0 - 17.0 g/dL   HCT 44.0 39.0 - 52.0 %  I-Stat CG4 Lactic Acid, ED     Status: Abnormal   Collection Time: 02/14/18  9:47 AM  Result Value Ref Range   Lactic Acid, Venous 2.58 (HH) 0.5 - 1.9 mmol/L   Comment NOTIFIED PHYSICIAN   I-Stat CG4 Lactic Acid, ED     Status: None   Collection Time: 02/14/18 12:13 PM  Result Value Ref Range   Lactic Acid, Venous 1.33 0.5 - 1.9 mmol/L  Urinalysis, Routine w reflex microscopic     Status: Abnormal   Collection Time: 02/14/18  1:05 PM  Result Value Ref Range   Color, Urine AMBER (A) YELLOW    Comment: BIOCHEMICALS MAY BE AFFECTED BY COLOR   APPearance  HAZY (A) CLEAR   Specific Gravity,  Urine 1.032 (H) 1.005 - 1.030   pH 5.0 5.0 - 8.0   Glucose, UA NEGATIVE NEGATIVE mg/dL   Hgb urine dipstick NEGATIVE NEGATIVE   Bilirubin Urine SMALL (A) NEGATIVE   Ketones, ur 80 (A) NEGATIVE mg/dL   Protein, ur 30 (A) NEGATIVE mg/dL   Nitrite NEGATIVE NEGATIVE   Leukocytes, UA NEGATIVE NEGATIVE   RBC / HPF 0-5 0 - 5 RBC/hpf   WBC, UA 6-10 0 - 5 WBC/hpf   Bacteria, UA NONE SEEN NONE SEEN   Squamous Epithelial / LPF 0-5 0 - 5   Mucus PRESENT    Hyaline Casts, UA PRESENT     Comment: Performed at [facility] [address], [place], [place] [address]    Ct Abdomen Pelvis W Contrast  Result Date: 02/14/2018 CLINICAL DATA:  Abdominal distension with nausea vomiting since yesterday. History of constipation. EXAM: CT ABDOMEN AND PELVIS WITH CONTRAST TECHNIQUE: Multidetector CT imaging of the abdomen and pelvis was performed using the standard protocol following bolus administration of intravenous contrast. CONTRAST:  19mL OMNIPAQUE IOHEXOL 300 MG/ML  SOLN COMPARISON:  12/14/2017 FINDINGS: Lower chest: Dependent opacity at the lung bases, right lower lobe most prominently, consistent with atelectasis. Hepatobiliary: Liver normal in size. No mass or focal lesion. There are 2 discrete gallstones. These are stable. No evidence of acute cholecystitis. No bile duct dilation. Pancreas: Unremarkable. No pancreatic ductal dilatation or surrounding inflammatory changes. Spleen: Normal in size without focal abnormality. Adrenals/Urinary Tract: No adrenal masses. Mild bilateral renal cortical thinning. Small nonobstructing stone in the upper pole the right kidney. Two left and 1 tiny right low-density renal masses most likely cysts. No other masses. No hydronephrosis. Normal ureters. Bladder is unremarkable. Stomach/Bowel: There is distended small bowel throughout most of the abdomen as well as moderate gastric distention. Maximum small bowel dilation is 6 cm. And  exact transition point is not defined. Colon is normal in caliber, but fluid-filled. The colon is mostly fluid-filled. The rectum and sigmoid demonstrate stool. There is no bowel wall thickening and no adjacent mesenteric inflammation. An anastomosis staple line is noted along the medial margin the lower ascending colon/cecum. Vascular/Lymphatic: No aortic aneurysm or atherosclerosis. 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 No pathologically enlarged lymph nodes. Reproductive: Unremarkable. Other: No ascites.  No hernia. Musculoskeletal: No fracture or acute finding. No osteoblastic or osteolytic lesions. IMPRESSION: 1. Findings are similar to the prior CT. There is significant small bowel distension, without colonic dilation. The exact point of transition is not defined. Findings are consistent with a distal partial small bowel obstruction. 2. No other acute findings within the abdomen or pelvis. 3. 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 4. Stable gallstones. 5. Right greater than left lung base opacity most consistent with atelectasis. Consider right lower lobe pneumonia or aspiration pneumonitis if there are consistent clinical findings. Electronically Signed   By: Lajean Manes M.D.   On: 02/14/2018 12:29   Dg Chest Port 1 View  Result Date: 02/14/2018 CLINICAL DATA:  Fever and abdominal pain EXAM: PORTABLE CHEST 1 VIEW COMPARISON:  12/24/2017 chest radiograph. FINDINGS: Low lung volumes. Stable cardiomediastinal silhouette with top-normal heart size. No pneumothorax. No pleural effusion. No pulmonary edema. Mild left basilar scarring versus atelectasis, decreased. No acute consolidative airspace disease. Mild gaseous distention of the visualized stomach. Cholelithiasis. IMPRESSION: Low lung volumes. Mild left basilar scarring versus atelectasis, decreased. Otherwise no active disease  in the chest. Mild gaseous distention  of the visualized stomach. Cholelithiasis. Electronically Signed   By: Ilona Sorrel M.D.   On: 02/14/2018 11:06    ROS - all of the below systems have been reviewed with the patient and positives are indicated with bold text General: chills, fever or night sweats Eyes: blurry vision or double vision ENT: epistaxis or sore throat Allergy/Immunology: itchy/watery eyes or nasal congestion Hematologic/Lymphatic: bleeding problems, blood clots or swollen lymph nodes Endocrine: temperature intolerance or unexpected weight changes Breast: new or changing breast lumps or nipple discharge Resp: cough, shortness of breath, or wheezing CV: chest pain or dyspnea on exertion GI: as per HPI GU: dysuria, trouble voiding, or hematuria MSK: joint pain or joint stiffness Neuro: TIA or stroke symptoms Derm: pruritus and skin lesion changes Psych: anxiety and depression  PE Blood pressure 121/71, pulse (!) 128, temperature (!) 101.3 F (38.5 C), temperature source Rectal, resp. rate (!) 28, height 5\' 5"  (1.651 m), weight 91.9 kg, SpO2 100 %. Constitutional: NAD; conversant; no deformities Eyes: Moist conjunctiva; no lid lag; anicteric; PERRL Neck: Trachea midline; no thyromegaly Lungs: Normal respiratory effort; no tactile fremitus CV: RRR; no palpable thrills; no pitting edema GI: Abd soft, moderately distended, not significantly tender in any quadrant; no rebound; no guarding; no palpable hepatosplenomegaly MSK: Bed bound; no clubbing/cyanosis Psychiatric: Appropriate affect; alert and oriented x3 Lymphatic: No palpable cervical or axillary lymphadenopathy  Results for orders placed or performed during the hospital encounter of 02/14/18 (from the past 48 hour(s))  Comprehensive metabolic panel     Status: Abnormal   Collection Time: 02/14/18  9:38 AM  Result Value Ref Range   Sodium 141 135 - 145 mmol/L   Potassium 3.9 3.5 - 5.1 mmol/L   Chloride 102 98 - 111 mmol/L   CO2 22 22 - 32 mmol/L    Glucose, Bld 138 (H) 70 - 99 mg/dL   BUN 12 6 - 20 mg/dL   Creatinine, Ser 1.18 0.61 - 1.24 mg/dL   Calcium 9.3 8.9 - 10.3 mg/dL   Total Protein 7.3 6.5 - 8.1 g/dL   Albumin 3.7 3.5 - 5.0 g/dL   AST 21 15 - 41 U/L   ALT 18 0 - 44 U/L   Alkaline Phosphatase 60 38 - 126 U/L   Total Bilirubin 1.6 (H) 0.3 - 1.2 mg/dL   GFR calc non Af Amer >60 >60 mL/min   GFR calc Af Amer >60 >60 mL/min   Anion gap 17 (H) 5 - 15    Comment: Performed at [facility], [address], [place], [place] [address]  CBC WITH DIFFERENTIAL     Status: Abnormal   Collection Time: 02/14/18  9:38 AM  Result Value Ref Range   WBC 10.4 4.0 - 10.5 K/uL   RBC 4.96 4.22 - 5.81 MIL/uL   Hemoglobin 14.2 13.0 - 17.0 g/dL   HCT 45.1 39.0 - 52.0 %   MCV 90.9 80.0 - 100.0 fL   MCH 28.6 26.0 - 34.0 pg   MCHC 31.5 30.0 - 36.0 g/dL   RDW 14.5 11.5 - 15.5 %   Platelets 299 150 - 400 K/uL   nRBC 0.0 0.0 - 0.2 %   Neutrophils Relative % 80 %   Neutro Abs 8.3 (H) 1.7 - 7.7 K/uL   Lymphocytes Relative 10 %   Lymphs Abs 1.0 0.7 - 4.0 K/uL   Monocytes Relative 9 %   Monocytes Absolute 1.0 0.1 - 1.0 K/uL  Eosinophils Relative 1 %   Eosinophils Absolute 0.1 0.0 - 0.5 K/uL   Basophils Relative 0 %   Basophils Absolute 0.0 0.0 - 0.1 K/uL   Immature Granulocytes 0 %   Abs Immature Granulocytes 0.02 0.00 - 0.07 K/uL    Comment: Performed at [facility] [address] [place], [place] [address]  Protime-INR     Status: Abnormal   Collection Time: 02/14/18  9:38 AM  Result Value Ref Range   Prothrombin Time 15.8 (H) 11.4 - 15.2 seconds   INR 1.27     Comment: Performed at [facility] [address], [place], [place] [address]  I-stat Chem 8, ED     Status: Abnormal   Collection Time: 02/14/18  9:46 AM  Result Value Ref Range   Sodium 139 135 - 145 mmol/L   Potassium 3.8 3.5 - 5.1 mmol/L   Chloride 104 98 - 111 mmol/L   BUN 12 6 - 20 mg/dL   Creatinine, Ser 0.90 0.61 - 1.24 mg/dL   Glucose, Bld 134  (H) 70 - 99 mg/dL   Calcium, Ion 1.04 (L) 1.15 - 1.40 mmol/L   TCO2 23 22 - 32 mmol/L   Hemoglobin 15.0 13.0 - 17.0 g/dL   HCT 44.0 39.0 - 52.0 %  I-Stat CG4 Lactic Acid, ED     Status: Abnormal   Collection Time: 02/14/18  9:47 AM  Result Value Ref Range   Lactic Acid, Venous 2.58 (HH) 0.5 - 1.9 mmol/L   Comment NOTIFIED PHYSICIAN   I-Stat CG4 Lactic Acid, ED     Status: None   Collection Time: 02/14/18 12:13 PM  Result Value Ref Range   Lactic Acid, Venous 1.33 0.5 - 1.9 mmol/L  Urinalysis, Routine w reflex microscopic     Status: Abnormal   Collection Time: 02/14/18  1:05 PM  Result Value Ref Range   Color, Urine AMBER (A) YELLOW    Comment: BIOCHEMICALS MAY BE AFFECTED BY COLOR   APPearance HAZY (A) CLEAR   Specific Gravity, Urine 1.032 (H) 1.005 - 1.030   pH 5.0 5.0 - 8.0   Glucose, UA NEGATIVE NEGATIVE mg/dL   Hgb urine dipstick NEGATIVE NEGATIVE   Bilirubin Urine SMALL (A) NEGATIVE   Ketones, ur 80 (A) NEGATIVE mg/dL   Protein, ur 30 (A) NEGATIVE mg/dL   Nitrite NEGATIVE NEGATIVE   Leukocytes, UA NEGATIVE NEGATIVE   RBC / HPF 0-5 0 - 5 RBC/hpf   WBC, UA 6-10 0 - 5 WBC/hpf   Bacteria, UA NONE SEEN NONE SEEN   Squamous Epithelial / LPF 0-5 0 - 5   Mucus PRESENT    Hyaline Casts, UA PRESENT     Comment: Performed at [facility], [address] [place], [place] [address]    Ct Abdomen Pelvis W Contrast  Result Date: 02/14/2018 CLINICAL DATA:  Abdominal distension with nausea vomiting since yesterday. History of constipation. EXAM: CT ABDOMEN AND PELVIS WITH CONTRAST TECHNIQUE: Multidetector CT imaging of the abdomen and pelvis was performed using the standard protocol following bolus administration of intravenous contrast. CONTRAST:  111mL OMNIPAQUE IOHEXOL 300 MG/ML  SOLN COMPARISON:  12/14/2017 FINDINGS: Lower chest: Dependent opacity at the lung bases, right lower lobe most prominently, consistent with atelectasis. Hepatobiliary: Liver normal in size. No mass or  focal lesion. There are 2 discrete gallstones. These are stable. No evidence of acute cholecystitis. No bile duct dilation. Pancreas: Unremarkable. No pancreatic ductal dilatation or surrounding inflammatory changes. Spleen:  Normal in size without focal abnormality. Adrenals/Urinary Tract: No adrenal masses. Mild bilateral renal cortical thinning. Small nonobstructing stone in the upper pole the right kidney. Two left and 1 tiny right low-density renal masses most likely cysts. No other masses. No hydronephrosis. Normal ureters. Bladder is unremarkable. Stomach/Bowel: There is distended small bowel throughout most of the abdomen as well as moderate gastric distention. Maximum small bowel dilation is 6 cm. And exact transition point is not defined. Colon is normal in caliber, but fluid-filled. The colon is mostly fluid-filled. The rectum and sigmoid demonstrate stool. There is no bowel wall thickening and no adjacent mesenteric inflammation. An anastomosis staple line is noted along the medial margin the lower ascending colon/cecum. Vascular/Lymphatic: No aortic aneurysm or atherosclerosis. 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 No pathologically enlarged lymph nodes. Reproductive: Unremarkable. Other: No ascites.  No hernia. Musculoskeletal: No fracture or acute finding. No osteoblastic or osteolytic lesions. IMPRESSION: 1. Findings are similar to the prior CT. There is significant small bowel distension, without colonic dilation. The exact point of transition is not defined. Findings are consistent with a distal partial small bowel obstruction. 2. No other acute findings within the abdomen or pelvis. 3. 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 4. Stable gallstones. 5. Right greater than left lung base opacity most consistent with atelectasis. Consider right lower lobe pneumonia or aspiration pneumonitis if there are consistent  clinical findings. Electronically Signed   By: Lajean Manes M.D.   On: 02/14/2018 12:29   Dg Chest Port 1 View  Result Date: 02/14/2018 CLINICAL DATA:  Fever and abdominal pain EXAM: PORTABLE CHEST 1 VIEW COMPARISON:  12/24/2017 chest radiograph. FINDINGS: Low lung volumes. Stable cardiomediastinal silhouette with top-normal heart size. No pneumothorax. No pleural effusion. No pulmonary edema. Mild left basilar scarring versus atelectasis, decreased. No acute consolidative airspace disease. Mild gaseous distention of the visualized stomach. Cholelithiasis. IMPRESSION: Low lung volumes. Mild left basilar scarring versus atelectasis, decreased. Otherwise no active disease in the chest. Mild gaseous distention of the visualized stomach. Cholelithiasis. Electronically Signed   By: Ilona Sorrel M.D.   On: 02/14/2018 11:06   A/P: [name] is a very pleasant 38 y.o. male with hx of CP and is nonambulatory, HTN, recurrent UTIs, atrial fibrillation (?) on anticoagulation - here with possible aspiration pneumonitis and recurrent pSBO vs reactive ileus  -He has been passing gas today; his abdominal exam is reassuring - there is no significant tenderness or peritoneal signs; given his similar presentation 10mo ago and a negative exploration, I think the probability of underlying bowel ischemia is quite low. In the setting of CP, metabolic/infectious insults can trigger ileus and partial obstructive type symptoms.  I discussed all of this with him, answered his questions and he is in agreement with the plan for observation.  -I have recommended NPO, MIVF, NG tube with small bowel protocol - he is currently refusing NG tube placement -He has been started on empiric abx for pneumonia by medicine -We will follow with you  Sharon Mt. Dema Severin, M.D. [facility]

## 2018-02-15 DIAGNOSIS — J690 Pneumonitis due to inhalation of food and vomit: Secondary | ICD-10-CM

## 2018-02-15 DIAGNOSIS — E872 Acidosis: Secondary | ICD-10-CM

## 2018-02-15 DIAGNOSIS — L89152 Pressure ulcer of sacral region, stage 2: Secondary | ICD-10-CM

## 2018-02-15 DIAGNOSIS — I480 Paroxysmal atrial fibrillation: Secondary | ICD-10-CM

## 2018-02-15 LAB — URINE CULTURE: Culture: <10000 — AB

## 2018-02-15 LAB — COMPREHENSIVE METABOLIC PANEL
ALT: 10 U/L (ref 0–44)
AST: 28 U/L (ref 15–41)
Albumin: 2.9 g/dL — ABNORMAL LOW (ref 3.5–5.0)
Alkaline Phosphatase: 47 U/L (ref 38–126)
Anion gap: 14 (ref 5–15)
BILIRUBIN TOTAL: 1.7 mg/dL — AB (ref 0.3–1.2)
BUN: 9 mg/dL (ref 6–20)
CO2: 18 mmol/L — ABNORMAL LOW (ref 22–32)
Calcium: 7.9 mg/dL — ABNORMAL LOW (ref 8.9–10.3)
Chloride: 110 mmol/L (ref 98–111)
Creatinine, Ser: 0.92 mg/dL (ref 0.61–1.24)
GFR calc Af Amer: >60 mL/min (ref >60)
GFR calc non Af Amer: >60 mL/min (ref >60)
Glucose, Bld: 100 mg/dL — ABNORMAL HIGH (ref 70–99)
Potassium: 4.6 mmol/L (ref 3.5–5.1)
Sodium: 142 mmol/L (ref 135–145)
TOTAL PROTEIN: 5.7 g/dL — AB (ref 6.5–8.1)

## 2018-02-15 LAB — CBC
HCT: 40 % (ref 39.0–52.0)
Hemoglobin: 12.4 g/dL — ABNORMAL LOW (ref 13.0–17.0)
MCH: 29.7 pg (ref 26.0–34.0)
MCHC: 31 g/dL (ref 30.0–36.0)
MCV: 95.9 fL (ref 80.0–100.0)
Platelets: 220 10*3/uL (ref 150–400)
RBC: 4.17 MIL/uL — ABNORMAL LOW (ref 4.22–5.81)
RDW: 14.6 % (ref 11.5–15.5)
WBC: 5.8 10*3/uL (ref 4.0–10.5)
nRBC: 0 % (ref 0.0–0.2)

## 2018-02-15 LAB — APTT: aPTT: 33 seconds (ref 24–36)

## 2018-02-15 MED ORDER — DIPHENHYDRAMINE HCL 25 MG PO CAPS
25.0000 mg | ORAL_CAPSULE | Freq: Four times a day (QID) | ORAL | Status: DC | PRN
  Administered 2018-02-16 – 2018-02-17 (×3): 25 mg via ORAL
  Filled 2018-02-15 (×6): qty 1

## 2018-02-15 MED ORDER — DIAZEPAM 2 MG PO TABS
2.0000 mg | ORAL_TABLET | Freq: Two times a day (BID) | ORAL | Status: DC
  Administered 2018-02-15 – 2018-02-18 (×7): 2 mg via ORAL
  Filled 2018-02-15 (×7): qty 1

## 2018-02-15 MED ORDER — METOPROLOL SUCCINATE ER 100 MG PO TB24
100.0000 mg | ORAL_TABLET | Freq: Every day | ORAL | Status: DC
  Administered 2018-02-15 – 2018-02-18 (×4): 100 mg via ORAL
  Filled 2018-02-15 (×4): qty 1

## 2018-02-15 MED ORDER — METOPROLOL TARTRATE 5 MG/5ML IV SOLN: 2.5000 mg | Freq: Four times a day (QID) | INTRAVENOUS | Status: DC | PRN

## 2018-02-15 MED ORDER — SODIUM CHLORIDE 0.9 % IV SOLN: INTRAVENOUS | Status: DC

## 2018-02-15 MED ORDER — TRAMADOL HCL 50 MG PO TABS
50.0000 mg | ORAL_TABLET | Freq: Once | ORAL | Status: AC
  Administered 2018-02-15: 50 mg via ORAL
  Filled 2018-02-15: qty 1

## 2018-02-15 MED ORDER — DEXTROSE-NACL 5-0.45 % IV SOLN
INTRAVENOUS | Status: DC
  Administered 2018-02-15 – 2018-02-16 (×3): via INTRAVENOUS

## 2018-02-15 MED ORDER — METOPROLOL TARTRATE 5 MG/5ML IV SOLN: 5.0000 mg | Freq: Four times a day (QID) | INTRAVENOUS | Status: DC

## 2018-02-15 MED ORDER — SODIUM CHLORIDE 0.9 % IV SOLN
1.5000 g | Freq: Four times a day (QID) | INTRAVENOUS | Status: DC
  Filled 2018-02-15 (×2): qty 1.5

## 2018-02-15 MED ORDER — SODIUM CHLORIDE 0.9 % IV SOLN
3.0000 g | Freq: Four times a day (QID) | INTRAVENOUS | Status: DC
  Administered 2018-02-15 – 2018-02-17 (×8): 3 g via INTRAVENOUS
  Filled 2018-02-15 (×10): qty 3

## 2018-02-15 MED ORDER — CYCLOBENZAPRINE HCL 10 MG PO TABS: 10.0000 mg | ORAL_TABLET | Freq: Two times a day (BID) | ORAL | Status: DC

## 2018-02-15 MED ORDER — CYCLOBENZAPRINE HCL 10 MG PO TABS
10.0000 mg | ORAL_TABLET | Freq: Two times a day (BID) | ORAL | Status: DC
  Administered 2018-02-15 – 2018-02-18 (×7): 10 mg via ORAL
  Filled 2018-02-15 (×7): qty 1

## 2018-02-15 MED ORDER — WARFARIN SODIUM 3 MG PO TABS
3.0000 mg | ORAL_TABLET | Freq: Once | ORAL | Status: AC
  Administered 2018-02-15: 3 mg via ORAL
  Filled 2018-02-15: qty 1

## 2018-02-15 MED ORDER — CYCLOBENZAPRINE HCL 10 MG PO TABS
20.0000 mg | ORAL_TABLET | Freq: Every day | ORAL | Status: DC
  Administered 2018-02-15 – 2018-02-17 (×3): 20 mg via ORAL
  Filled 2018-02-15 (×4): qty 2

## 2018-02-15 MED ORDER — WARFARIN - PHARMACIST DOSING INPATIENT: Freq: Every day | Status: DC

## 2018-02-15 MED ORDER — DIPHENHYDRAMINE HCL 50 MG/ML IJ SOLN
25.0000 mg | Freq: Once | INTRAMUSCULAR | Status: AC
  Administered 2018-02-16: 25 mg via INTRAVENOUS

## 2018-02-15 MED ORDER — BACLOFEN 20 MG PO TABS
20.0000 mg | ORAL_TABLET | Freq: Four times a day (QID) | ORAL | Status: DC
  Administered 2018-02-15 – 2018-02-18 (×14): 20 mg via ORAL
  Filled 2018-02-15 (×14): qty 1

## 2018-02-15 MED ORDER — SODIUM CHLORIDE 0.9 % IV BOLUS
500.0000 mL | Freq: Once | INTRAVENOUS | Status: AC
  Administered 2018-02-15: 500 mL via INTRAVENOUS

## 2018-02-15 MED ORDER — TIZANIDINE HCL 2 MG PO TABS
2.0000 mg | ORAL_TABLET | Freq: Three times a day (TID) | ORAL | Status: DC
  Administered 2018-02-15 – 2018-02-18 (×10): 2 mg via ORAL
  Filled 2018-02-15 (×10): qty 1

## 2018-02-15 MED ORDER — SODIUM CHLORIDE 0.9 % IV BOLUS
1000.0000 mL | Freq: Once | INTRAVENOUS | Status: AC
  Administered 2018-02-15: 1000 mL via INTRAVENOUS

## 2018-02-15 NOTE — Progress Notes (Signed)
Paged MD for order for Valium at pt. Request. Received a one time order and administered to pt. Will continue to monitor.

## 2018-02-15 NOTE — Progress Notes (Signed)
[facility] PROGRESS NOTE    Progress Note  [name]  WOE:[id number] DOB: 1980-10-19 DOA: 02/14/2018 PCP: [name], MD     Brief Narrative:   [name] is an 38 y.o. male past medical history of cerebral palsy, chronic constipation, coagulopathy and recurrent UTIs, with atrial fibrillation on Coumadin was held prior to admission for a dental procedure 3 days prior to admission, with a recent laparotomy due to small bowel obstruction on November 2019 comes to the emergency room presented with nausea and abdominal distention he relate a temperature of 100.3  Assessment/Plan:   Partial small bowel obstruction ([place]) Presented with nausea vomiting and abdominal distention the patient decline NG tube. There is no abdominal tenderness or peritoneal signs on physical exam. General surgery was consulted they recommended to continue conservative management and will follow along. We will bolus him with normal saline and continue D5W with dextrose for at least 24 to 48 hours recheck a basic metabolic panel in the morning. Patient is currently n.p.o.  Pulmonary infiltrates on CT and chest x-ray possibly due to aspiration pneumonia: With a fever no leukocytosis in a patient it has been nauseating and throwing up I am more concerned about aspiration pneumonia. We will discontinue cefepime and Flagyl. We will start him on IV Unasyn.,  Will continue antibiotic coverage for 5 days.  Paroxysmal atrial fibrillation ([place]) Start him on IV metoprolol discontinue IV labetalol. Continue cardiac monitoring. Currently sinus tach.  Chronic gallstone nonobstructive: Currently asymptomatic.  History of chronic constipation: Oral medication currently held due to small bowel obstruction.  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  Hx  Neurogenic bladder  Lactic acidosis Lactic acid has resolved. We will continue IV fluid is unlikely due to sepsis.  Sacral  decubitus ulcer stage II: Consult wound care nurse.   DVT prophylaxis: Heparin Family Communication:none Disposition Plan/Barrier to D/C: unabel to detemrine Code Status:     Code Status Orders  (From admission, onward)         Start     Ordered   02/14/18 1509  Full code  Continuous     02/14/18 1508        Code Status History    Date Active Date Inactive Code Status Order ID Comments User Context   12/14/2017 2227 12/28/2017 1912 Full Code [id number]  Renee Pain, MD ED   05/14/2015 0324 05/16/2015 1545 Full Code [id number]  Edwin Dada, MD Inpatient   06/03/2011 1305 06/11/2011 0042 Full Code [id number]  Theodis Blaze, MD ED        IV Access:    Peripheral IV   Procedures and diagnostic studies:   Ct Abdomen Pelvis W Contrast  Result Date: 02/14/2018 CLINICAL DATA:  Abdominal distension with nausea vomiting since yesterday. History of constipation. EXAM: CT ABDOMEN AND PELVIS WITH CONTRAST TECHNIQUE: Multidetector CT imaging of the abdomen and pelvis was performed using the standard protocol following bolus administration of intravenous contrast. CONTRAST:  155mL OMNIPAQUE IOHEXOL 300 MG/ML  SOLN COMPARISON:  12/14/2017 FINDINGS: Lower chest: Dependent opacity at the lung bases, right lower lobe most prominently, consistent with atelectasis. Hepatobiliary: Liver normal in size. No mass or focal lesion. There are 2 discrete gallstones. These are stable. No evidence of acute cholecystitis. No bile duct dilation. Pancreas: Unremarkable. No pancreatic ductal dilatation or surrounding inflammatory changes. Spleen: Normal in size without focal abnormality. Adrenals/Urinary Tract: No adrenal masses. Mild bilateral renal cortical thinning. Small nonobstructing stone in the upper pole the right kidney. Two  left and 1 tiny right low-density renal masses most likely cysts. No other masses. No hydronephrosis. Normal ureters. Bladder is unremarkable. Stomach/Bowel: There is  distended small bowel throughout most of the abdomen as well as moderate gastric distention. Maximum small bowel dilation is 6 cm. And exact transition point is not defined. Colon is normal in caliber, but fluid-filled. The colon is mostly fluid-filled. The rectum and sigmoid demonstrate stool. There is no bowel wall thickening and no adjacent mesenteric inflammation. An anastomosis staple line is noted along the medial margin the lower ascending colon/cecum. Vascular/Lymphatic: No aortic aneurysm or atherosclerosis. 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 No pathologically enlarged lymph nodes. Reproductive: Unremarkable. Other: No ascites.  No hernia. Musculoskeletal: No fracture or acute finding. No osteoblastic or osteolytic lesions. IMPRESSION: 1. Findings are similar to the prior CT. There is significant small bowel distension, without colonic dilation. The exact point of transition is not defined. Findings are consistent with a distal partial small bowel obstruction. 2. No other acute findings within the abdomen or pelvis. 3. 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 4. Stable gallstones. 5. Right greater than left lung base opacity most consistent with atelectasis. Consider right lower lobe pneumonia or aspiration pneumonitis if there are consistent clinical findings. Electronically Signed   By: Lajean Manes M.D.   On: 02/14/2018 12:29   Dg Chest Port 1 View  Result Date: 02/14/2018 CLINICAL DATA:  Fever and abdominal pain EXAM: PORTABLE CHEST 1 VIEW COMPARISON:  12/24/2017 chest radiograph. FINDINGS: Low lung volumes. Stable cardiomediastinal silhouette with top-normal heart size. No pneumothorax. No pleural effusion. No pulmonary edema. Mild left basilar scarring versus atelectasis, decreased. No acute consolidative airspace disease. Mild gaseous distention of the visualized stomach. Cholelithiasis. IMPRESSION: Low  lung volumes. Mild left basilar scarring versus atelectasis, decreased. Otherwise no active disease in the chest. Mild gaseous distention of the visualized stomach. Cholelithiasis. Electronically Signed   By: Ilona Sorrel M.D.   On: 02/14/2018 11:06     Medical Consultants:    None.  Anti-Infectives:   IV Unasyn  Subjective:    [name] denies any abdominal pain, he relates he ate yesterday night, has not thrown up since then.  He denies passing gas or bowel movements.  Objective:    Vitals:   02/14/18 1508 02/14/18 1820 02/14/18 2035 02/15/18 0417  BP: 124/76 118/74 137/69 128/74  Pulse: (!) 128 (!) 126 (!) 123 (!) 118  Resp: (!) 28 (!) 26 (!) 24 (!) 22  Temp: 98.1 F (36.7 C) 99.4 F (37.4 C) 98.4 F (36.9 C) (!) 97.3 F (36.3 C)  TempSrc: Axillary Oral Oral Axillary  SpO2: 93% 92% 95% 100%  Weight: 133.1 kg     Height:        Intake/Output Summary (Last 24 hours) at 02/15/2018 0742 Last data filed at 02/15/2018 0558 Gross per 24 hour  Intake 500 ml  Output 500 ml  Net 0 ml   Filed Weights   02/14/18 1029 02/14/18 1508  Weight: 91.9 kg 133.1 kg    Exam: General exam: In no acute distress. Respiratory system: Good air movement with some crackles on the right Cardiovascular system: S1 & S2 heard, RRR. No JVD. Gastrointestinal system: Positive bowel sounds soft mildly distended no guarding no tenderness Extremities: No pedal edema. Skin: No rashes, lesions or ulcers   Data Reviewed:    Labs: Basic Metabolic Panel: Recent Labs  Lab 02/14/18 0938 02/14/18 0946 02/15/18 0422  NA 141 139 142  K 3.9 3.8 4.6  CL 102 104 110  CO2 22  --  18*  GLUCOSE 138* 134* 100*  BUN 12 12 9   CREATININE 1.18 0.90 0.92  CALCIUM 9.3  --  7.9*   GFR Estimated Creatinine Clearance: 138.7 mL/min (by C-G formula based on SCr of 0.92 mg/dL). Liver Function Tests: Recent Labs  Lab 02/14/18 0938 02/15/18 0422  AST 21 28  ALT 18 10  ALKPHOS 60 47  BILITOT  1.6* 1.7*  PROT 7.3 5.7*  ALBUMIN 3.7 2.9*   No results for input(s): LIPASE, AMYLASE in the last 168 hours. No results for input(s): AMMONIA in the last 168 hours. Coagulation profile Recent Labs  Lab 02/14/18 0938  INR 1.27    CBC: Recent Labs  Lab 02/14/18 0938 02/14/18 0946 02/15/18 0422  WBC 10.4  --  5.8  NEUTROABS 8.3*  --   --   HGB 14.2 15.0 12.4*  HCT 45.1 44.0 40.0  MCV 90.9  --  95.9  PLT 299  --  220   Cardiac Enzymes: No results for input(s): CKTOTAL, CKMB, CKMBINDEX, TROPONINI in the last 168 hours. BNP (last 3 results) No results for input(s): PROBNP in the last 8760 hours. CBG: No results for input(s): GLUCAP in the last 168 hours. D-Dimer: No results for input(s): DDIMER in the last 72 hours. Hgb A1c: No results for input(s): HGBA1C in the last 72 hours. Lipid Profile: No results for input(s): CHOL, HDL, LDLCALC, TRIG, CHOLHDL, LDLDIRECT in the last 72 hours. Thyroid function studies: No results for input(s): TSH, T4TOTAL, T3FREE, THYROIDAB in the last 72 hours.  Invalid input(s): FREET3 Anemia work up: No results for input(s): VITAMINB12, FOLATE, FERRITIN, TIBC, IRON, RETICCTPCT in the last 72 hours. Sepsis Labs: Recent Labs  Lab 02/14/18 3646 02/14/18 0947 02/14/18 1213 02/15/18 0422  WBC 10.4  --   --  5.8  LATICACIDVEN  --  2.58* 1.33  --    Microbiology No results found for this or any previous visit (from the past 240 hour(s)).   Medications:   . heparin  5,000 Units Subcutaneous Q8H  . senna-docusate  3 tablet Oral QHS   Continuous Infusions: . ceFEPime (MAXIPIME) IV 2 g (02/15/18 0309)  . metronidazole 500 mg (02/15/18 0041)      LOS: 1 day   Charlynne Cousins  [facility]   *Please refer to [email or web address], password [email or web address] to get updated schedule on who will round on this patient, as hospitalists switch teams weekly. If 7PM-7AM, please contact night-coverage at [email or web address], password [email or web address] for any overnight  needs.  02/15/2018, 7:42 AM
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
## 2018-02-15 NOTE — Progress Notes (Signed)
Started IVF and Flagyl as ordered, patient "I don't want IV." Reminded patient he had an infection that we were treating and since he was NPO the IVF would help prevent dehydration. Patient agreed to continue.

## 2018-02-15 NOTE — Progress Notes (Signed)
ANTICOAGULATION CONSULT NOTE - Initial Consult  Pharmacy Consult for warfarin Indication: atrial fibrillation  Allergies  Allergen Reactions  . Betadine [Povidone Iodine] Other (See Comments)    Unknown reaction per caregiver  . Ciprofloxacin Other (See Comments)    Unknown reaction per caregiver  . Morphine Other (See Comments)    Unknown reaction per caregiver    Patient Measurements: Height: 5\' 5"  (165.1 cm) Weight: 293 lb 6.9 oz (133.1 kg) IBW/kg (Calculated) : 61.5  Vital Signs: Temp: 98.5 F (36.9 C) (01/19 1429) Temp Source: Axillary (01/19 1429) BP: 125/83 (01/19 1429) Pulse Rate: 97 (01/19 1429)  Labs: Recent Labs    02/14/18 0938 02/14/18 0946 02/15/18 0422  HGB 14.2 15.0 12.4*  HCT 45.1 44.0 40.0  PLT 299  --  220  APTT  --   --  33  LABPROT 15.8*  --   --   INR 1.27  --   --   CREATININE 1.18 0.90 0.92    Estimated Creatinine Clearance: 138.7 mL/min (by C-G formula based on SCr of 0.92 mg/dL).   Medical History: Past Medical History:  Diagnosis Date  . Cerebral palsy ([place])   . Chronic constipation   . Coagulopathy ([place])   . DVT (deep venous thrombosis) ([place])   . Hypertension   . Insomnia   . Recurrent UTI   . Tachycardia     Assessment: 39 yoM admitted with abdominal pain on warfarin PTA for PAF and hx DVT. INR was subtherapeutic on admission after holding for dental procedures (last dose 1/17 afterwards) and was held on admit with NPO status and possible need for surgical procedures. Per MD pharmacy to resume warfarin tonight.  INR yesterday 1.27, no INR drawn today and lab unable to draw this afternoon as pt was combative. Suspect INR still close to 1.3 but will dose cautiously with home dose rather than boosting. Check INR tomorrow with am labs.  *Home warfarin dose = 3mg  daily  Goal of Therapy:  INR 2-3 Monitor platelets by anticoagulation protocol: Yes   Plan:  -Warfarin 3 x1 tonight -Daily protime  Arrie Senate, PharmD,  BCPS Clinical Pharmacist Please check AMION for all [place] numbers 02/15/2018

## 2018-02-15 NOTE — Plan of Care (Signed)
  Problem: Clinical Measurements: Goal: Will remain free from infection Outcome: Progressing   Problem: Coping: Goal: Level of anxiety will decrease Outcome: Progressing   Problem: Elimination: Goal: Will not experience complications related to urinary retention Outcome: Progressing   Problem: Pain Managment: Goal: General experience of comfort will improve Outcome: Progressing   Problem: Safety: Goal: Ability to remain free from injury will improve Outcome: Progressing   Problem: Skin Integrity: Goal: Risk for impaired skin integrity will decrease Outcome: Progressing

## 2018-02-15 NOTE — Progress Notes (Signed)
6N 4 asking for sleep med, specifically IV per his request. Messaged [facility], waiting for return call.

## 2018-02-15 NOTE — Progress Notes (Signed)
PHARMACY NOTE:  ANTIMICROBIAL RENAL DOSAGE ADJUSTMENT  Current antimicrobial regimen includes a mismatch between antimicrobial dosage and estimated renal function.  As per policy approved by the Pharmacy & Therapeutics and Medical Executive Committees, the antimicrobial dosage will be adjusted accordingly.  Current antimicrobial dosage:  Unasyn 1.5g IV every 6 hours  Indication: Aspiration Pneumonia  Renal Function:  Estimated Creatinine Clearance: 138.7 mL/min (by C-G formula based on SCr of 0.92 mg/dL). []      On intermittent HD, scheduled: []      On CRRT    Antimicrobial dosage has been changed to:  Unasyn 3g IV every 6 hours   Additional comments: Increased dose based on indication, age, and renal clearance -Reviewed allergies - none to penicillin listed.    Thank you for allowing pharmacy to be a part of this patient's care.  Brain Hilts, [facility] 02/15/2018 8:07 AM

## 2018-02-16 LAB — PROTIME-INR
INR: 1.67
Prothrombin Time: 19.5 seconds — ABNORMAL HIGH (ref 11.4–15.2)

## 2018-02-16 MED ORDER — WARFARIN SODIUM 3 MG PO TABS
3.0000 mg | ORAL_TABLET | Freq: Once | ORAL | Status: AC
  Administered 2018-02-16: 3 mg via ORAL
  Filled 2018-02-16: qty 1

## 2018-02-16 MED ORDER — BISACODYL 10 MG RE SUPP
10.0000 mg | Freq: Once | RECTAL | Status: DC
  Filled 2018-02-16 (×2): qty 1

## 2018-02-16 MED ORDER — CALCIUM CARBONATE ANTACID 500 MG PO CHEW
1.0000 | CHEWABLE_TABLET | Freq: Three times a day (TID) | ORAL | Status: DC
  Administered 2018-02-16 – 2018-02-18 (×7): 200 mg via ORAL
  Filled 2018-02-16 (×7): qty 1

## 2018-02-16 MED ORDER — DEXTROSE 5 % IV SOLN
INTRAVENOUS | Status: DC
  Administered 2018-02-16 – 2018-02-18 (×4): via INTRAVENOUS

## 2018-02-16 NOTE — Progress Notes (Signed)
ANTICOAGULATION CONSULT NOTE  Pharmacy Consult:  Coumadin Indication: atrial fibrillation  Allergies  Allergen Reactions  . Betadine [Povidone Iodine] Other (See Comments)    Unknown reaction per caregiver  . Ciprofloxacin Other (See Comments)    Unknown reaction per caregiver  . Morphine Other (See Comments)    Unknown reaction per caregiver    Patient Measurements: Height: 5\' 5"  (165.1 cm) Weight: 293 lb 6.9 oz (133.1 kg) IBW/kg (Calculated) : 61.5  Vital Signs: Temp: 98.3 F (36.8 C) (01/20 0553) Temp Source: Axillary (01/20 0553) BP: 109/70 (01/20 0553) Pulse Rate: 86 (01/20 0553)  Labs: Recent Labs    02/14/18 0938 02/14/18 0946 02/15/18 0422 02/16/18 0125  HGB 14.2 15.0 12.4*  --   HCT 45.1 44.0 40.0  --   PLT 299  --  220  --   APTT  --   --  33  --   LABPROT 15.8*  --   --  19.5*  INR 1.27  --   --  1.67  CREATININE 1.18 0.90 0.92  --     Estimated Creatinine Clearance: 138.7 mL/min (by C-G formula based on SCr of 0.92 mg/dL).   Assessment: 22 YOM admitted with abdominal pain on Coumadin PTA for PAF and history of DVT. INR was subtherapeutic on admission after holding for dental procedures (last dose 1/17 afterwards) and was held on admit with NPO status and possible need for surgical procedures.  Coumadin resumed per Pharmacy on 02/12/18.  INR sub-therapeutic but trending up (?sensitive to Coumadin).  No bleeding reported.  Home dose = 3mg  daily  Goal of Therapy:  INR 2-3 Monitor platelets by anticoagulation protocol: Yes   Plan:  Repeat Coumadin 3mg  PO today Daily PT / INR   Cathyrn Deas D. Mina Marble, PharmD, BCPS, [place] 02/16/2018, 10:38 AM

## 2018-02-16 NOTE — Evaluation (Signed)
Physical Therapy Evaluation Patient Details Name: [name] MRN: [id number] DOB: 1980/04/20 Today's Date: 02/16/2018   History of Present Illness  Patient is a 38 y/o male presenting to the ED on 02/14/18 with primary complaints of N&V and abdominal pain. Past medical history significant for cerebral palsy chronic constipation coagulopathy hypertension insomnia recurrent UTI tachycardia versus atrial fibrillation on anticoagulation. Admitted for partial small bowel obstruction.     Clinical Impression  Patient admitted with the above listed diagnosis. Patient at baseline requires total A for all bed mobility, transfers, peri care, and other ADLs. PT speaking with [name], caregiver, with him stating he provides total care for patient along with home nurse, but does feel that patient would benefit from [place] at home as he is currently being lifted by caregiver and nurse to w/c. Patient stating he feels as if he is close to baseline. Will plan to see patient for one more session for patient/caregiver education and to address any other needs. Will follow.     Follow Up Recommendations No PT follow up;Supervision/Assistance - 24 hour    Equipment Recommendations  (hoyer lift)    Recommendations for Other Services       Precautions / Restrictions Precautions Precautions: Fall Restrictions Weight Bearing Restrictions: No      Mobility  Bed Mobility Overal bed mobility: Needs Assistance Bed Mobility: Rolling Rolling: Total assist;+2 for physical assistance         General bed mobility comments: total A for all bed level mobility - noted UE spasticity when UE are not pressed against bed rails  Transfers                 General transfer comment: deferred  Ambulation/Gait                Stairs            Wheelchair Mobility    Modified Rankin (Stroke Patients Only)       Balance                                              Pertinent Vitals/Pain Pain Assessment: No/denies pain    Home Living Family/patient expects to be discharged to:: Private residence Living Arrangements: Non-relatives/Friends(caregiver) Available Help at Discharge: Personal care attendant;Available 24 hours/day   Home Access: Level entry     Home Layout: One level Home Equipment: Wheelchair - power;Shower seat;Hospital bed Additional Comments: spoke to caregiver, [name] - reports patient would benefit from lift for OOB mobility    Prior Function Level of Independence: Needs assistance   Gait / Transfers Assistance Needed: Pt mobilizes independently using a head control power wheelchair. Wheelchair adapted with seatbelt, adductors, and arm restraints. Requires "two person total assist," in and out of w/c  ADL's / Homemaking Assistance Needed: requires assist for all ADL's including feeding        Hand Dominance        Extremity/Trunk Assessment   Upper Extremity Assessment Upper Extremity Assessment: Defer to OT evaluation    Lower Extremity Assessment Lower Extremity Assessment: Generalized weakness;RLE deficits/detail;LLE deficits/detail RLE Deficits / Details: history of CP - nonambulatory LLE Deficits / Details: history of CP - nonambulatory       Communication   Communication: Expressive difficulties(will spell words that are difficult to understand)  Cognition Arousal/Alertness: Awake/alert Behavior During Therapy: WFL for tasks assessed/performed  Overall Cognitive Status: Within Functional Limits for tasks assessed                                        General Comments General comments (skin integrity, edema, etc.): patient very polite - requesting PT to call [name] (caregiver) to bring w/c    Exercises     Assessment/Plan    PT Assessment Patient needs continued PT services  PT Problem List Decreased strength;Decreased activity tolerance;Decreased balance;Decreased  mobility;Decreased knowledge of use of DME;Decreased safety awareness       PT Treatment Interventions DME instruction;Functional mobility training;Therapeutic activities;Therapeutic exercise;Balance training;Patient/family education    PT Goals (Current goals can be found in the Care Plan section)  Acute Rehab PT Goals Patient Stated Goal: none stated PT Goal Formulation: With patient Time For Goal Achievement: 03/02/18 Potential to Achieve Goals: Good    Frequency Min 2X/week   Barriers to discharge        Co-evaluation               AM-PAC PT "6 Clicks" Mobility  Outcome Measure Help needed turning from your back to your side while in a flat bed without using bedrails?: Total Help needed moving from lying on your back to sitting on the side of a flat bed without using bedrails?: Total Help needed moving to and from a bed to a chair (including a wheelchair)?: Total Help needed standing up from a chair using your arms (e.g., wheelchair or bedside chair)?: Total Help needed to walk in hospital room?: Total Help needed climbing 3-5 steps with a railing? : Total 6 Click Score: 6    End of Session   Activity Tolerance: Patient tolerated treatment well Patient left: in bed;with call bell/phone within reach Nurse Communication: Mobility status PT Visit Diagnosis: Muscle weakness (generalized) (M62.81)    Time: 4462-8638 PT Time Calculation (min) (ACUTE ONLY): 15 min   Charges:   PT Evaluation $PT Eval Moderate Complexity: 1 Mod           Lanney Gins, PT, DPT Supplemental Physical Therapist 02/16/18 12:59 PM Pager: [phone] Office: [phone]

## 2018-02-16 NOTE — Discharge Instructions (Signed)

## 2018-02-16 NOTE — Progress Notes (Signed)
Subjective/Chief Complaint: Pt states he is passing flatus, but no BMs   Objective: Vital signs in last 24 hours: Temp:  [98.3 F (36.8 C)-98.6 F (37 C)] 98.3 F (36.8 C) (01/20 0553) Pulse Rate:  [86-99] 86 (01/20 0553) Resp:  [16-22] 16 (01/20 0553) BP: (109-129)/(70-83) 109/70 (01/20 0553) SpO2:  [97 %-100 %] 97 % (01/20 0553) Last BM Date: 02/11/18  Intake/Output from previous day: 01/19 0701 - 01/20 0700 In: 1866.5 [P.O.:25; I.V.:1541.5; IV Piggyback:300] Out: 1000 [Urine:1000] Intake/Output this shift: No intake/output data recorded.  Constitutional: No acute distress, conversant, appears states age. Eyes: Anicteric sclerae, moist conjunctiva, no lid lag Lungs: Clear to auscultation bilaterally, normal respiratory effort CV: regular rate and rhythm, no murmurs, no peripheral edema, pedal pulses 2+ GI: Soft, no masses or hepatosplenomegaly, non-tender to palpation Skin: No rashes, palpation reveals normal turgor Psychiatric: appropriate judgment and insight, oriented to person, place, and time   Lab Results:  Recent Labs    02/14/18 0938 02/14/18 0946 02/15/18 0422  WBC 10.4  --  5.8  HGB 14.2 15.0 12.4*  HCT 45.1 44.0 40.0  PLT 299  --  220   BMET Recent Labs    02/14/18 0938 02/14/18 0946 02/15/18 0422  NA 141 139 142  K 3.9 3.8 4.6  CL 102 104 110  CO2 22  --  18*  GLUCOSE 138* 134* 100*  BUN 12 12 9   CREATININE 1.18 0.90 0.92  CALCIUM 9.3  --  7.9*   PT/INR Recent Labs    02/14/18 0938 02/16/18 0125  LABPROT 15.8* 19.5*  INR 1.27 1.67   ABG No results for input(s): PHART, HCO3 in the last 72 hours.  Invalid input(s): PCO2, PO2  Studies/Results: Ct Abdomen Pelvis W Contrast  Result Date: 02/14/2018 CLINICAL DATA:  Abdominal distension with nausea vomiting since yesterday. History of constipation. EXAM: CT ABDOMEN AND PELVIS WITH CONTRAST TECHNIQUE: Multidetector CT imaging of the abdomen and pelvis was performed using the  standard protocol following bolus administration of intravenous contrast. CONTRAST:  16mL OMNIPAQUE IOHEXOL 300 MG/ML  SOLN COMPARISON:  12/14/2017 FINDINGS: Lower chest: Dependent opacity at the lung bases, right lower lobe most prominently, consistent with atelectasis. Hepatobiliary: Liver normal in size. No mass or focal lesion. There are 2 discrete gallstones. These are stable. No evidence of acute cholecystitis. No bile duct dilation. Pancreas: Unremarkable. No pancreatic ductal dilatation or surrounding inflammatory changes. Spleen: Normal in size without focal abnormality. Adrenals/Urinary Tract: No adrenal masses. Mild bilateral renal cortical thinning. Small nonobstructing stone in the upper pole the right kidney. Two left and 1 tiny right low-density renal masses most likely cysts. No other masses. No hydronephrosis. Normal ureters. Bladder is unremarkable. Stomach/Bowel: There is distended small bowel throughout most of the abdomen as well as moderate gastric distention. Maximum small bowel dilation is 6 cm. And exact transition point is not defined. Colon is normal in caliber, but fluid-filled. The colon is mostly fluid-filled. The rectum and sigmoid demonstrate stool. There is no bowel wall thickening and no adjacent mesenteric inflammation. An anastomosis staple line is noted along the medial margin the lower ascending colon/cecum. Vascular/Lymphatic: No aortic aneurysm or atherosclerosis. 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 No pathologically enlarged lymph nodes. Reproductive: Unremarkable. Other: No ascites.  No hernia. Musculoskeletal: No fracture or acute finding. No osteoblastic or osteolytic lesions. IMPRESSION: 1. Findings are similar to the prior CT. There is significant small bowel  distension, without colonic dilation. The exact point of transition is not defined. Findings are consistent with a distal partial small bowel  obstruction. 2. No other acute findings within the abdomen or pelvis. 3. 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 4. Stable gallstones. 5. Right greater than left lung base opacity most consistent with atelectasis. Consider right lower lobe pneumonia or aspiration pneumonitis if there are consistent clinical findings. Electronically Signed   By: Lajean Manes M.D.   On: 02/14/2018 12:29   Dg Chest Port 1 View  Result Date: 02/14/2018 CLINICAL DATA:  Fever and abdominal pain EXAM: PORTABLE CHEST 1 VIEW COMPARISON:  12/24/2017 chest radiograph. FINDINGS: Low lung volumes. Stable cardiomediastinal silhouette with top-normal heart size. No pneumothorax. No pleural effusion. No pulmonary edema. Mild left basilar scarring versus atelectasis, decreased. No acute consolidative airspace disease. Mild gaseous distention of the visualized stomach. Cholelithiasis. IMPRESSION: Low lung volumes. Mild left basilar scarring versus atelectasis, decreased. Otherwise no active disease in the chest. Mild gaseous distention of the visualized stomach. Cholelithiasis. Electronically Signed   By: Ilona Sorrel M.D.   On: 02/14/2018 11:06    Anti-infectives: Anti-infectives (From admission, onward)   Start     Dose/Rate Route Frequency Ordered Stop   02/15/18 1000  ampicillin-sulbactam (UNASYN) 1.5 g in sodium chloride 0.9 % 100 mL IVPB  Status:  Discontinued     1.5 g 200 mL/hr over 30 Minutes Intravenous Every 6 hours 02/15/18 0747 02/15/18 0808   02/15/18 1000  Ampicillin-Sulbactam (UNASYN) 3 g in sodium chloride 0.9 % 100 mL IVPB     3 g 200 mL/hr over 30 Minutes Intravenous Every 6 hours 02/15/18 0808     02/14/18 1830  ceFEPIme (MAXIPIME) 2 g in sodium chloride 0.9 % 100 mL IVPB  Status:  Discontinued     2 g 200 mL/hr over 30 Minutes Intravenous Every 8 hours 02/14/18 1110 02/15/18 0747   02/14/18 0915  ceFEPIme (MAXIPIME) 2 g in sodium chloride 0.9 % 100 mL IVPB     2 g 200 mL/hr over 30  Minutes Intravenous  Once 02/14/18 0911 02/14/18 1215   02/14/18 0915  metroNIDAZOLE (FLAGYL) IVPB 500 mg  Status:  Discontinued     500 mg 100 mL/hr over 60 Minutes Intravenous Every 8 hours 02/14/18 0911 02/15/18 0745      Assessment/Plan: 26M ileus CP HTN Afib Aspiration pneumonitis  1. Passing some flatus.  Will try supp to stimulate GI 2. Mobilize as tol   LOS: 2 days    Ralene Ok 02/16/2018

## 2018-02-16 NOTE — Progress Notes (Signed)
[facility] PROGRESS NOTE    Progress Note  [name]  PHX:[id number] DOB: Mar 25, 1980 DOA: 02/14/2018 PCP: Michael Ebbs, MD     Brief Narrative:   [name] is an 38 y.o. male past medical history of cerebral palsy, chronic constipation, coagulopathy and recurrent UTIs, with atrial fibrillation on Coumadin was held prior to admission for a dental procedure 3 days prior to admission, with a recent laparotomy due to small bowel obstruction on November 2019 comes to the emergency room presented with nausea and abdominal distention he relate a temperature of 100.3  Assessment/Plan:   Partial small bowel obstruction ([place]) Pt. Decline NG tube. Physical exam is benign General surgery was consulted they recommended to continue conservative management and will follow along. Currently n.p.o. continue D5 half-normal. Patient just had large bowel movement.  Allow some ice chips. Continue IV fluids until patient is able to take orals.  Surgery to dictate diet  Pulmonary infiltrates on CT and chest x-ray possibly due to aspiration pneumonia: Fever has resolved he was started empirically on IV Unasyn. He relates he feels better, saturations at baseline.  Paroxysmal atrial fibrillation (HCC) Currently normal sinus rhythm discontinue telemetry. He was started on his oral rate controlling medication heart rate seems to be controlled. Resume Coumadin per pharmacy.  INRs checked daily  Chronic gallstone nonobstructive: Currently asymptomatic.  History of chronic constipation: Resume home regimen.  Spasticity due to cerebral palsy: Allow ice chips.  Hx  Neurogenic bladder  Lactic acidosis Lactic acid has resolved. There is not sepsis.  Sacral decubitus ulcer stage II: Consult wound care nurse.   DVT prophylaxis: Heparin Family Communication:none Disposition Plan/Barrier to D/C: unabel to detemrine Code Status:     Code Status Orders  (From admission,  onward)         Start     Ordered   02/14/18 1509  Full code  Continuous     02/14/18 1508        Code Status History    Date Active Date Inactive Code Status Order ID Comments User Context   12/14/2017 2227 12/28/2017 1912 Full Code [id number]  Renee Pain, MD ED   05/14/2015 0324 05/16/2015 1545 Full Code [id number]  Edwin Dada, MD Inpatient   06/03/2011 1305 06/11/2011 0042 Full Code [id number]  Theodis Blaze, MD ED        IV Access:    Peripheral IV   Procedures and diagnostic studies:   Ct Abdomen Pelvis W Contrast  Result Date: 02/14/2018 CLINICAL DATA:  Abdominal distension with nausea vomiting since yesterday. History of constipation. EXAM: CT ABDOMEN AND PELVIS WITH CONTRAST TECHNIQUE: Multidetector CT imaging of the abdomen and pelvis was performed using the standard protocol following bolus administration of intravenous contrast. CONTRAST:  161mL OMNIPAQUE IOHEXOL 300 MG/ML  SOLN COMPARISON:  12/14/2017 FINDINGS: Lower chest: Dependent opacity at the lung bases, right lower lobe most prominently, consistent with atelectasis. Hepatobiliary: Liver normal in size. No mass or focal lesion. There are 2 discrete gallstones. These are stable. No evidence of acute cholecystitis. No bile duct dilation. Pancreas: Unremarkable. No pancreatic ductal dilatation or surrounding inflammatory changes. Spleen: Normal in size without focal abnormality. Adrenals/Urinary Tract: No adrenal masses. Mild bilateral renal cortical thinning. Small nonobstructing stone in the upper pole the right kidney. Two left and 1 tiny right low-density renal masses most likely cysts. No other masses. No hydronephrosis. Normal ureters. Bladder is unremarkable. Stomach/Bowel: There is distended small bowel throughout most of the abdomen as well as moderate  gastric distention. Maximum small bowel dilation is 6 cm. And exact transition point is not defined. Colon is normal in caliber, but fluid-filled. The  colon is mostly fluid-filled. The rectum and sigmoid demonstrate stool. There is no bowel wall thickening and no adjacent mesenteric inflammation. An anastomosis staple line is noted along the medial margin the lower ascending colon/cecum. Vascular/Lymphatic: No aortic aneurysm or atherosclerosis. 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 No pathologically enlarged lymph nodes. Reproductive: Unremarkable. Other: No ascites.  No hernia. Musculoskeletal: No fracture or acute finding. No osteoblastic or osteolytic lesions. IMPRESSION: 1. Findings are similar to the prior CT. There is significant small bowel distension, without colonic dilation. The exact point of transition is not defined. Findings are consistent with a distal partial small bowel obstruction. 2. No other acute findings within the abdomen or pelvis. 3. 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 4. Stable gallstones. 5. Right greater than left lung base opacity most consistent with atelectasis. Consider right lower lobe pneumonia or aspiration pneumonitis if there are consistent clinical findings. Electronically Signed   By: Lajean Manes M.D.   On: 02/14/2018 12:29   Dg Chest Port 1 View  Result Date: 02/14/2018 CLINICAL DATA:  Fever and abdominal pain EXAM: PORTABLE CHEST 1 VIEW COMPARISON:  12/24/2017 chest radiograph. FINDINGS: Low lung volumes. Stable cardiomediastinal silhouette with top-normal heart size. No pneumothorax. No pleural effusion. No pulmonary edema. Mild left basilar scarring versus atelectasis, decreased. No acute consolidative airspace disease. Mild gaseous distention of the visualized stomach. Cholelithiasis. IMPRESSION: Low lung volumes. Mild left basilar scarring versus atelectasis, decreased. Otherwise no active disease in the chest. Mild gaseous distention of the visualized stomach. Cholelithiasis. Electronically Signed   By: Ilona Sorrel M.D.    On: 02/14/2018 11:06     Medical Consultants:    None.  Anti-Infectives:   IV Unasyn  Subjective:    [name] relates he had a bowel movement before I went into the room.  Objective:    Vitals:   02/15/18 0417 02/15/18 1429 02/15/18 2042 02/16/18 0553  BP: 128/74 125/83 129/79 109/70  Pulse: (!) 118 97 99 86  Resp: (!) 22 (!) 22 16 16   Temp: (!) 97.3 F (36.3 C) 98.5 F (36.9 C) 98.6 F (37 C) 98.3 F (36.8 C)  TempSrc: Axillary Axillary Axillary Axillary  SpO2: 100% 97% 100% 97%  Weight:      Height:        Intake/Output Summary (Last 24 hours) at 02/16/2018 0959 Last data filed at 02/16/2018 0554 Gross per 24 hour  Intake 1866.51 ml  Output 1000 ml  Net 866.51 ml   Filed Weights   02/14/18 1029 02/14/18 1508  Weight: 91.9 kg 133.1 kg    Exam: General exam: In no acute distress. Respiratory system: Good air movement with some crackles on the right Cardiovascular system: S1 & S2 heard, RRR. No JVD. Gastrointestinal system: Positive bowel sounds soft mildly distended no guarding no tenderness Extremities: No pedal edema. Skin: No rashes, lesions or ulcers   Data Reviewed:    Labs: Basic Metabolic Panel: Recent Labs  Lab 02/14/18 0938 02/14/18 0946 02/15/18 0422  NA 141 139 142  K 3.9 3.8 4.6  CL 102 104 110  CO2 22  --  18*  GLUCOSE 138* 134* 100*  BUN 12 12 9   CREATININE 1.18 0.90 0.92  CALCIUM 9.3  --  7.9*   GFR  Estimated Creatinine Clearance: 138.7 mL/min (by C-G formula based on SCr of 0.92 mg/dL). Liver Function Tests: Recent Labs  Lab 02/14/18 0938 02/15/18 0422  AST 21 28  ALT 18 10  ALKPHOS 60 47  BILITOT 1.6* 1.7*  PROT 7.3 5.7*  ALBUMIN 3.7 2.9*   No results for input(s): LIPASE, AMYLASE in the last 168 hours. No results for input(s): AMMONIA in the last 168 hours. Coagulation profile Recent Labs  Lab 02/14/18 0938 02/16/18 0125  INR 1.27 1.67    CBC: Recent Labs  Lab 02/14/18 0938 02/14/18 0946  02/15/18 0422  WBC 10.4  --  5.8  NEUTROABS 8.3*  --   --   HGB 14.2 15.0 12.4*  HCT 45.1 44.0 40.0  MCV 90.9  --  95.9  PLT 299  --  220   Cardiac Enzymes: No results for input(s): CKTOTAL, CKMB, CKMBINDEX, TROPONINI in the last 168 hours. BNP (last 3 results) No results for input(s): PROBNP in the last 8760 hours. CBG: No results for input(s): GLUCAP in the last 168 hours. D-Dimer: No results for input(s): DDIMER in the last 72 hours. Hgb A1c: No results for input(s): HGBA1C in the last 72 hours. Lipid Profile: No results for input(s): CHOL, HDL, LDLCALC, TRIG, CHOLHDL, LDLDIRECT in the last 72 hours. Thyroid function studies: No results for input(s): TSH, T4TOTAL, T3FREE, THYROIDAB in the last 72 hours.  Invalid input(s): FREET3 Anemia work up: No results for input(s): VITAMINB12, FOLATE, FERRITIN, TIBC, IRON, RETICCTPCT in the last 72 hours. Sepsis Labs: Recent Labs  Lab 02/14/18 1610 02/14/18 0947 02/14/18 1213 02/15/18 0422  WBC 10.4  --   --  5.8  LATICACIDVEN  --  2.58* 1.33  --    Microbiology Recent Results (from the past 240 hour(s))  Blood Culture (routine x 2)     Status: None (Preliminary result)   Collection Time: 02/14/18  9:38 AM  Result Value Ref Range Status   Specimen Description BLOOD BLOOD LEFT WRIST  Final   Special Requests   Final    BOTTLES DRAWN AEROBIC AND ANAEROBIC Blood Culture results may not be optimal due to an inadequate volume of blood received in culture bottles   Culture   Final    NO GROWTH 2 DAYS Performed at [facility] [address] [place], [place] [address]    Report Status PENDING  Incomplete  Blood Culture (routine x 2)     Status: None (Preliminary result)   Collection Time: 02/14/18  9:39 AM  Result Value Ref Range Status   Specimen Description BLOOD BLOOD RIGHT FOREARM  Final   Special Requests   Final    BOTTLES DRAWN AEROBIC AND ANAEROBIC Blood Culture adequate volume   Culture   Final    NO GROWTH 2  DAYS Performed at [facility], [place] [address], [place], [place] [address]    Report Status PENDING  Incomplete  Urine culture     Status: Abnormal   Collection Time: 02/14/18  1:05 PM  Result Value Ref Range Status   Specimen Description URINE, RANDOM  Final   Special Requests NONE  Final   Culture (A)  Final    <10,000 COLONIES/mL INSIGNIFICANT GROWTH Performed at [facility], [place] [address], [place],  [address]    Report Status 02/15/2018 FINAL  Final     Medications:   . baclofen  20 mg Oral QID  . bisacodyl  10 mg Rectal Once  . cyclobenzaprine  10 mg Oral BID   And  . cyclobenzaprine  20 mg Oral QHS  . diazepam  2 mg Oral BID  . metoprolol succinate  100 mg Oral Daily  . senna-docusate  3 tablet Oral QHS  . tiZANidine  2 mg Oral TID  . Warfarin - Pharmacist Dosing Inpatient   Does not apply q1800   Continuous Infusions: . ampicillin-sulbactam (UNASYN) IV 3 g (02/16/18 0947)  . dextrose 5 % and 0.45% NaCl 125 mL/hr at 02/16/18 0701      LOS: 2 days   Charlynne Cousins  [facility]   *Please refer to [email or web address], password [email or web address] to get updated schedule on who will round on this patient, as hospitalists switch teams weekly. If 7PM-7AM, please contact night-coverage at [email or web address], password [email or web address] for any overnight needs.  02/16/2018, 9:59 AM

## 2018-02-16 NOTE — Care Management Note (Signed)
Case Management Note  Patient Details  Name: [name] MRN: [id number] Date of Birth: 11/22/1980  Subjective/Objective:                    Action/Plan: From home, will continue to follow for discharge needs.  Expected Discharge Date:                  Expected Discharge Plan:     In-House Referral:     Discharge planning Services     Post Acute Care Choice:    Choice offered to:     DME Arranged:    DME Agency:     HH Arranged:    HH Agency:     Status of Service:  In process, will continue to follow  If discussed at Long Length of Stay Meetings, dates discussed:    Additional Comments:  Marilu Favre, RN 02/16/2018, 11:25 AM

## 2018-02-17 LAB — PROTIME-INR
INR: 3.21
INR: 1.83
Prothrombin Time: 32.3 seconds — ABNORMAL HIGH (ref 11.4–15.2)
Prothrombin Time: 20.9 seconds — ABNORMAL HIGH (ref 11.4–15.2)

## 2018-02-17 MED ORDER — AMOXICILLIN-POT CLAVULANATE 250-62.5 MG/5ML PO SUSR
500.0000 mg | Freq: Three times a day (TID) | ORAL | Status: DC
  Administered 2018-02-17 – 2018-02-18 (×5): 500 mg via ORAL
  Filled 2018-02-17 (×7): qty 10

## 2018-02-17 MED ORDER — GERHARDT'S BUTT CREAM
TOPICAL_CREAM | Freq: Two times a day (BID) | CUTANEOUS | Status: DC
  Administered 2018-02-17 – 2018-02-18 (×3): via TOPICAL
  Filled 2018-02-17: qty 1

## 2018-02-17 MED ORDER — WARFARIN SODIUM 4 MG PO TABS
4.0000 mg | ORAL_TABLET | Freq: Once | ORAL | Status: AC
  Administered 2018-02-17: 4 mg via ORAL
  Filled 2018-02-17: qty 1

## 2018-02-17 MED ORDER — BISACODYL 10 MG RE SUPP: 10.0000 mg | Freq: Every day | RECTAL | Status: DC | PRN

## 2018-02-17 MED ORDER — DOCUSATE SODIUM 100 MG PO CAPS: 100.0000 mg | ORAL_CAPSULE | Freq: Every day | ORAL | Status: DC | PRN

## 2018-02-17 NOTE — Consult Note (Signed)
[facility] Nurse wound consult note Reason for Consult: Stage 3 pressure injury to sacrum.  Pressure and moisture Wound type:presure and moisture Pressure Injury POA: Yes Measurement:1 cm x 1 cm x 0.2 cm  Wound OIZ:TIWP and moist Drainage (amount, consistency, odor) scant serosanguinous  No odor Periwound:intact  Frequent exposure to moisture from incontinence Dressing procedure/placement/frequency: Cleanse sacral wound with NS. Apply Gerhardts butt paste twice daily and PRN soilage. Turn and reposition every two hours.  Will not follow at this time.  Please re-consult if needed.  Domenic Moras MSN, RN, FNP-BC CWON Wound, Ostomy, Continence Nurse Pager [phone]

## 2018-02-17 NOTE — Progress Notes (Addendum)
[facility] PROGRESS NOTE    Progress Note  [name]  POE:[id number] DOB: 1980/09/09 DOA: 02/14/2018 PCP: [name], MD     Brief Narrative:   [name] is an 38 y.o. male past medical history of cerebral palsy, chronic constipation, coagulopathy and recurrent UTIs, with atrial fibrillation on Coumadin was held prior to admission for a dental procedure 3 days prior to admission, with a recent laparotomy due to small bowel obstruction on November 2019 comes to the emergency room presented with nausea and abdominal distention he relate a temperature of 100.3  Assessment/Plan:   Ileus in the setting of infectious etiology: Physical exam abdomen appears more distended but benign on physical exam. Appreciate general assistance from surgery. Currently on clears and half-normal saline. Patient just had large bowel movement.  Allow some ice chips. Surgery to dictate diet.  Pulmonary infiltrates on CT and chest x-ray possibly due to aspiration pneumonia: Fever has resolved he was started empirically on IV Unasyn. Has remained afebrile with no leukocytosis we will change antibiotics to liquid Augmentin.  Paroxysmal atrial fibrillation (HCC) Currently normal sinus rhythm discontinue telemetry. Rate control INR supratherapeutic, continue Coumadin per pharmacy.  Chronic gallstone nonobstructive: Currently asymptomatic.  History of chronic constipation: Resume home regimen.  Spasticity due to cerebral palsy: Allow ice chips.  Hx  Neurogenic bladder  Lactic acidosis Lactic acid has resolved. This is not sepsis.  Sacral decubitus ulcer stage II, present on admission: Consult wound care nurse.   DVT prophylaxis: Heparin Family Communication:none Disposition Plan/Barrier to D/C: unabel to detemrine Code Status:     Code Status Orders  (From admission, onward)         Start     Ordered   02/14/18 1509  Full code  Continuous     02/14/18 1508          Code Status History    Date Active Date Inactive Code Status Order ID Comments User Context   12/14/2017 2227 12/28/2017 1912 Full Code [id number]  Renee Pain, MD ED   05/14/2015 0324 05/16/2015 1545 Full Code [id number]  Edwin Dada, MD Inpatient   06/03/2011 1305 06/11/2011 0042 Full Code [id number]  Theodis Blaze, MD ED        IV Access:    Peripheral IV   Procedures and diagnostic studies:   No results found.   Medical Consultants:    None.  Anti-Infectives:   Liquids Augmentin.  Subjective:    [name] he only has had a single bowel movement, he denies any abdominal pain feels great wants a regular diet.  Objective:    Vitals:   02/16/18 0553 02/16/18 1529 02/16/18 2149 02/17/18 0517  BP: 109/70 118/70 118/67 115/78  Pulse: 86 91 78 73  Resp: 16 14 (!) 24 18  Temp: 98.3 F (36.8 C) 98.1 F (36.7 C) 98.7 F (37.1 C) (!) 97.3 F (36.3 C)  TempSrc: Axillary Axillary Oral Oral  SpO2: 97% 100% 98% 100%  Weight:      Height:        Intake/Output Summary (Last 24 hours) at 02/17/2018 0844 Last data filed at 02/17/2018 0546 Gross per 24 hour  Intake 1599.35 ml  Output 1075 ml  Net 524.35 ml   Filed Weights   02/14/18 1029 02/14/18 1508  Weight: 91.9 kg 133.1 kg    Exam: General exam: In no acute distress. Respiratory system: Good air movement with some crackles on the right Cardiovascular system: S1 & S2 heard, RRR. No JVD.  Gastrointestinal system: Positive bowel sounds soft mildly distended no guarding no tenderness Extremities: No pedal edema. Skin: No rashes, lesions or ulcers   Data Reviewed:    Labs: Basic Metabolic Panel: Recent Labs  Lab 02/14/18 0938 02/14/18 0946 02/15/18 0422  NA 141 139 142  K 3.9 3.8 4.6  CL 102 104 110  CO2 22  --  18*  GLUCOSE 138* 134* 100*  BUN 12 12 9   CREATININE 1.18 0.90 0.92  CALCIUM 9.3  --  7.9*   GFR Estimated Creatinine Clearance: 138.7 mL/min (by C-G formula  based on SCr of 0.92 mg/dL). Liver Function Tests: Recent Labs  Lab 02/14/18 0938 02/15/18 0422  AST 21 28  ALT 18 10  ALKPHOS 60 47  BILITOT 1.6* 1.7*  PROT 7.3 5.7*  ALBUMIN 3.7 2.9*   No results for input(s): LIPASE, AMYLASE in the last 168 hours. No results for input(s): AMMONIA in the last 168 hours. Coagulation profile Recent Labs  Lab 02/14/18 0938 02/16/18 0125 02/17/18 0623  INR 1.27 1.67 3.21    CBC: Recent Labs  Lab 02/14/18 0938 02/14/18 0946 02/15/18 0422  WBC 10.4  --  5.8  NEUTROABS 8.3*  --   --   HGB 14.2 15.0 12.4*  HCT 45.1 44.0 40.0  MCV 90.9  --  95.9  PLT 299  --  220   Cardiac Enzymes: No results for input(s): CKTOTAL, CKMB, CKMBINDEX, TROPONINI in the last 168 hours. BNP (last 3 results) No results for input(s): PROBNP in the last 8760 hours. CBG: No results for input(s): GLUCAP in the last 168 hours. D-Dimer: No results for input(s): DDIMER in the last 72 hours. Hgb A1c: No results for input(s): HGBA1C in the last 72 hours. Lipid Profile: No results for input(s): CHOL, HDL, LDLCALC, TRIG, CHOLHDL, LDLDIRECT in the last 72 hours. Thyroid function studies: No results for input(s): TSH, T4TOTAL, T3FREE, THYROIDAB in the last 72 hours.  Invalid input(s): FREET3 Anemia work up: No results for input(s): VITAMINB12, FOLATE, FERRITIN, TIBC, IRON, RETICCTPCT in the last 72 hours. Sepsis Labs: Recent Labs  Lab 02/14/18 1610 02/14/18 0947 02/14/18 1213 02/15/18 0422  WBC 10.4  --   --  5.8  LATICACIDVEN  --  2.58* 1.33  --    Microbiology Recent Results (from the past 240 hour(s))  Blood Culture (routine x 2)     Status: None (Preliminary result)   Collection Time: 02/14/18  9:38 AM  Result Value Ref Range Status   Specimen Description BLOOD BLOOD LEFT WRIST  Final   Special Requests   Final    BOTTLES DRAWN AEROBIC AND ANAEROBIC Blood Culture results may not be optimal due to an inadequate volume of blood received in culture  bottles   Culture   Final    NO GROWTH 2 DAYS Performed at [facility] [address], [place], [place] [address]    Report Status PENDING  Incomplete  Blood Culture (routine x 2)     Status: None (Preliminary result)   Collection Time: 02/14/18  9:39 AM  Result Value Ref Range Status   Specimen Description BLOOD BLOOD RIGHT FOREARM  Final   Special Requests   Final    BOTTLES DRAWN AEROBIC AND ANAEROBIC Blood Culture adequate volume   Culture   Final    NO GROWTH 2 DAYS Performed at [facility], [place] [address] [place], [place] [address]    Report Status PENDING  Incomplete  Urine culture  Status: Abnormal   Collection Time: 02/14/18  1:05 PM  Result Value Ref Range Status   Specimen Description URINE, RANDOM  Final   Special Requests NONE  Final   Culture (A)  Final    <10,000 COLONIES/mL INSIGNIFICANT GROWTH Performed at [facility], [place] [address] [place], [place] [address]    Report Status 02/15/2018 FINAL  Final     Medications:   . baclofen  20 mg Oral QID  . bisacodyl  10 mg Rectal Once  . calcium carbonate  1 tablet Oral TID  . cyclobenzaprine  10 mg Oral BID   And  . cyclobenzaprine  20 mg Oral QHS  . diazepam  2 mg Oral BID  . metoprolol succinate  100 mg Oral Daily  . senna-docusate  3 tablet Oral QHS  . tiZANidine  2 mg Oral TID  . Warfarin - Pharmacist Dosing Inpatient   Does not apply q1800   Continuous Infusions: . ampicillin-sulbactam (UNASYN) IV 3 g (02/17/18 0454)  . dextrose 75 mL/hr at 02/17/18 0546      LOS: 3 days   Charlynne Cousins  [facility]   *Please refer to [email or web address], password [email or web address] to get updated schedule on who will round on this patient, as hospitalists switch teams weekly. If 7PM-7AM, please contact night-coverage at [email or web address], password [email or web address] for any overnight needs.  02/17/2018, 8:44 AM

## 2018-02-17 NOTE — Care Management Note (Signed)
Case Management Note  Patient Details  Name: [name] MRN: [id number] Date of Birth: Jun 02, 1980  Subjective/Objective:                    Action/Plan:  Discussed discharge planning with patient at bedside. Patient from home with caregiver and plans to return. Patient does not currently have home health and at this time does not feel that he needs home health. Patient has PCP, insurance and is able to get his prescriptions filled. Has all DME.   Caregiver will transport patient home at time of discharge.  Will continue to follow. Expected Discharge Date:                  Expected Discharge Plan:  Home/Self Care  In-House Referral:     Discharge planning Services  CM Consult  Post Acute Care Choice:    Choice offered to:  Patient  DME Arranged:  N/A DME Agency:  NA  HH Arranged:  NA HH Agency:  NA  Status of Service:  In process, will continue to follow  If discussed at Long Length of Stay Meetings, dates discussed:    Additional Comments:  Marilu Favre, RN 02/17/2018, 2:23 PM

## 2018-02-17 NOTE — Progress Notes (Signed)
ANTICOAGULATION CONSULT NOTE  Pharmacy Consult:  Coumadin Indication: atrial fibrillation  Allergies  Allergen Reactions  . Betadine [Povidone Iodine] Other (See Comments)    Unknown reaction per caregiver  . Ciprofloxacin Other (See Comments)    Unknown reaction per caregiver  . Morphine Other (See Comments)    Unknown reaction per caregiver    Patient Measurements: Height: 5\' 5"  (165.1 cm) Weight: 293 lb 6.9 oz (133.1 kg) IBW/kg (Calculated) : 61.5  Vital Signs: Temp: 97.3 F (36.3 C) (01/21 0517) Temp Source: Oral (01/21 0517) BP: 115/78 (01/21 0517) Pulse Rate: 73 (01/21 0517)  Labs: Recent Labs    02/15/18 0422 02/16/18 0125 02/17/18 0623 02/17/18 1004  HGB 12.4*  --   --   --   HCT 40.0  --   --   --   PLT 220  --   --   --   APTT 33  --   --   --   LABPROT  --  19.5* 32.3* 20.9*  INR  --  1.67 3.21 1.83  CREATININE 0.92  --   --   --     Estimated Creatinine Clearance: 138.7 mL/min (by C-G formula based on SCr of 0.92 mg/dL).   Assessment: 39 YOM admitted with abdominal pain on Coumadin PTA for PAF and history of DVT. INR was subtherapeutic on admission after holding for dental procedures (last dose 1/17 afterwards) and was held on admit with NPO status and possible need for surgical procedures.  Coumadin resumed per Pharmacy on 02/15/18.  Flagyl likely cleared from patient's system.  INR sub-therapeutic but trending up (reported INR of 3.21 is an error, repeat INR 1.83).  No bleeding reported.  Home dose: 3mg  PO daily  Goal of Therapy:  INR 2-3 Monitor platelets by anticoagulation protocol: Yes   Plan:  Coumadin 4mg  PO today Daily PT / INR   Shakeem Stern D. Mina Marble, PharmD, BCPS, [place] 02/17/2018, 11:06 AM

## 2018-02-17 NOTE — Care Management Important Message (Signed)
Important Message  Patient Details  Name: [name] MRN: [id number] Date of Birth: Jan 20, 1981   Medicare Important Message Given:  Yes    Orbie Pyo 02/17/2018, 2:31 PM

## 2018-02-17 NOTE — Final Consult Note (Signed)
Consultant Final Sign-Off Note    Assessment/Final recommendations  [name] is a 38 y.o. male followed by me for ileus   Wound care (if applicable): per WOC recommendations   Diet at discharge: advance as tolerated to soft diet   Activity at discharge: per primary team   Follow-up appointment:  None needed   Pending results:  Unresulted Labs (From admission, onward)    Start     Ordered   02/17/18 [place] - STAT,   R     02/17/18 0836   02/16/18 0500  Protime-INR  Daily,   R     02/15/18 1538           Medication recommendations: colace prn and suppository prn for mild constipation   Other recommendations: Soft diet for a few days at first and then can advance back to patient's usual eating habits.     Thank you for allowing [place] to participate in the care of your patient!  Please consult [place] again if you have further needs for your patient.  Claiborne Billings Rayburn 02/17/2018 9:16 AM    Subjective  CC: ileus Patient denies abdominal pain this AM. Reports he had a BM this AM and is passing flatus. Denies nausea and tolerating ice chips. Wants to know when he can have solid food.   Objective  Vital signs in last 24 hours: Temp:  [97.3 F (36.3 C)-98.7 F (37.1 C)] 97.3 F (36.3 C) (01/21 0517) Pulse Rate:  [73-91] 73 (01/21 0517) Resp:  [14-24] 18 (01/21 0517) BP: (115-118)/(67-78) 115/78 (01/21 0517) SpO2:  [98 %-100 %] 100 % (01/21 0517)  Physical Exam  Constitutional: No distress.  Cardiovascular: Normal rate and regular rhythm.  Pulmonary/Chest: He has wheezes (expiratory R>L).  Uses abdominal muscles to aid in breathing  Abdominal: Soft. He exhibits no distension. There is no abdominal tenderness. There is no rebound and no guarding.  Musculoskeletal:     Comments: Arms contracted bilaterally   Neurological:  Alert and answering questions appropriately  Skin: Skin is warm and dry.     Pertinent labs and Studies: Recent Labs   02/14/18 0938 02/14/18 0946 02/15/18 0422  WBC 10.4  --  5.8  HGB 14.2 15.0 12.4*  HCT 45.1 44.0 40.0   BMET Recent Labs    02/14/18 0938 02/14/18 0946 02/15/18 0422  NA 141 139 142  K 3.9 3.8 4.6  CL 102 104 110  CO2 22  --  18*  GLUCOSE 138* 134* 100*  BUN 12 12 9   CREATININE 1.18 0.90 0.92  CALCIUM 9.3  --  7.9*   No results for input(s): LABURIN in the last 72 hours. Results for orders placed or performed during the hospital encounter of 02/14/18  Blood Culture (routine x 2)     Status: None (Preliminary result)   Collection Time: 02/14/18  9:38 AM  Result Value Ref Range Status   Specimen Description BLOOD BLOOD LEFT WRIST  Final   Special Requests   Final    BOTTLES DRAWN AEROBIC AND ANAEROBIC Blood Culture results may not be optimal due to an inadequate volume of blood received in culture bottles   Culture   Final    NO GROWTH 2 DAYS Performed at [facility] [address] [place], [place] [address]    Report Status PENDING  Incomplete  Blood Culture (routine x 2)     Status: None (Preliminary result)   Collection Time: 02/14/18  9:39 AM  Result Value Ref Range Status   Specimen Description BLOOD BLOOD RIGHT FOREARM  Final   Special Requests   Final    BOTTLES DRAWN AEROBIC AND ANAEROBIC Blood Culture adequate volume   Culture   Final    NO GROWTH 2 DAYS Performed at [facility], [address], [place], [place] [address]    Report Status PENDING  Incomplete  Urine culture     Status: Abnormal   Collection Time: 02/14/18  1:05 PM  Result Value Ref Range Status   Specimen Description URINE, RANDOM  Final   Special Requests NONE  Final   Culture (A)  Final    <10,000 COLONIES/mL INSIGNIFICANT GROWTH Performed at [facility], [place] [address], [place], [place] [address]    Report Status 02/15/2018 FINAL  Final    Imaging: No results found.

## 2018-02-18 DIAGNOSIS — K5900 Constipation, unspecified: Secondary | ICD-10-CM

## 2018-02-18 DIAGNOSIS — K567 Ileus, unspecified: Principal | ICD-10-CM

## 2018-02-18 DIAGNOSIS — N319 Neuromuscular dysfunction of bladder, unspecified: Secondary | ICD-10-CM

## 2018-02-18 LAB — PROTIME-INR
INR: 1.77
Prothrombin Time: 20.4 seconds — ABNORMAL HIGH (ref 11.4–15.2)

## 2018-02-18 MED ORDER — WARFARIN SODIUM 5 MG PO TABS: 5.0000 mg | ORAL_TABLET | Freq: Once | ORAL | Status: DC

## 2018-02-18 MED ORDER — AMOXICILLIN-POT CLAVULANATE 250-62.5 MG/5ML PO SUSR: 500.0000 mg | Freq: Three times a day (TID) | ORAL | 0 refills | Status: AC

## 2018-02-18 NOTE — Progress Notes (Signed)
Discharge instructions reviewed with caregiver, Sherlon Handing.  Patient discharged in his wheelchair.

## 2018-02-18 NOTE — Discharge Summary (Signed)
Physician Discharge Summary  [name] PYP:[id number] DOB: 09/23/1980 DOA: 02/14/2018  PCP: [name], MD  Admit date: 02/14/2018 Discharge date: 02/18/2018  Admitted From: home Disposition:  home  Recommendations for Outpatient Follow-up:  1. Follow up with PCP in 1-2 weeks  Home Health: none  Equipment/Devices: none   Discharge Condition: stable CODE STATUS: Full code Diet recommendation: regular  HPI: Per admitting MD, [name] is a 38 y.o. male with medical history significant for cerebral palsy chronic constipation coagulopathy hypertension insomnia recurrent UTI tachycardia versus atrial fibrillation on anticoagulation.  He also had recent small bowel obstruction with laparotomy around November 2019, he presented today emergency department today with nausea vomiting that started and abdominal distention that started the day prior to presentation they denied any fever but currently his temperature is 100.3 on presentation to the emergency department.  He recently had a dental procedure on Thursday during which his Coumadin was held but it was restarted Thursday after the procedure.  Currently patient denies any nausea he had already received Zofran. History was obtained from caregiver patient who was speaking through his caregiver and emergency department personnel and nurses note was reviewed ED Course: Patient presented to the emergency department he underwent CT scan of the abdomen, sepsis code was called because his initial lactic acid was 2.58.  Repeat lactic acid like 3 hours later came back down to 1.3 he received several boluses of IV fluid.  He was started on metronidazole IV every 8 hours and cefepime 2 g every 8 hours.  Pharmacy consult was obtained for medication management  Hospital Course: Ileus in the setting of infectious etiology -general surgery was consulted and followed patient while hospitalized.  He responded to conservative management,  clinically ileus is resolved, and is tolerating a regular diet. Pulmonary infiltrates on CT and chest x-ray possibly due to aspiration pneumonia -Fever has resolved he was started empirically on IV Unasyn.  Responded well, afebrile, no leukocytosis, his antibiotics will be changed to Augmentin for 5 additional days. Paroxysmal atrial fibrillation (HCC) -Currently normal sinus rhythm, continue Coumadin Chronic gallstone nonobstructive -Currently asymptomatic. History of chronic constipation- Resume home regimen. 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 This is not sepsis. Sacral decubitus ulcer stage II, present on admission -Consult wound care nurse.  Discharge Diagnoses:  Principal Problem:   Ileus ([place]) Active Problems:   Atrial fibrillation (HCC)   Constipation   Spasticity   Neurogenic bladder   Lactic acidosis   Tachycardia, unspecified   Aspiration pneumonia of both lower lobes due to gastric secretions (HCC)   Sacral decubitus ulcer, stage II ([place])     Discharge Instructions   Allergies as of 02/18/2018      Reactions   Betadine [povidone Iodine] Other (See Comments)   Unknown reaction per caregiver   Ciprofloxacin Other (See Comments)   Unknown reaction per caregiver   Morphine Other (See Comments)   Unknown reaction per caregiver      Medication List    TAKE these medications   acetaminophen 325 MG tablet Commonly known as:  TYLENOL Take 650 mg by mouth every 6 (six) hours as needed. For pain   amoxicillin-clavulanate 250-62.5 MG/5ML suspension Commonly known as:  AUGMENTIN Take 10 mLs (500 mg total) by mouth every 8 (eight) hours for 15 doses.   baclofen 20 MG tablet Commonly known as:  LIORESAL Take 1 tablet (20 mg total) by mouth 4 (four) times  daily.   calcium carbonate 500 MG chewable tablet Commonly known as:  TUMS - dosed in mg elemental calcium Chew 1 tablet by mouth  3 (three) times daily.   cetirizine 10 MG tablet Commonly known as:  ZYRTEC Take 10 mg by mouth daily.   cholecalciferol 1000 units tablet Commonly known as:  VITAMIN D Take 1,000 Units by mouth 2 (two) times daily.   cyclobenzaprine 10 MG tablet Commonly known as:  FLEXERIL 1 at 8am and 12pm and 2 tabs at 8pm. What changed:    how much to take  how to take this  when to take this   diazepam 2 MG tablet Commonly known as:  VALIUM Take 1 tablet (2 mg total) by mouth 2 (two) times daily. Must last 30 days.   diphenhydrAMINE 25 MG tablet Commonly known as:  BENADRYL Take 25 mg by mouth every 6 (six) hours as needed for allergies.   fluticasone 27.5 MCG/SPRAY nasal spray Commonly known as:  VERAMYST Place 1 spray into the nose at bedtime as needed for rhinitis.   hydrochlorothiazide 25 MG tablet Commonly known as:  HYDRODIURIL Take 12.5 mg by mouth daily.   loperamide 2 MG capsule Commonly known as:  IMODIUM Take 2 mg by mouth 3 (three) times daily as needed for diarrhea or loose stools. Reported on 05/11/2015   metoprolol succinate 100 MG 24 hr tablet Commonly known as:  TOPROL-XL Take 100 mg by mouth daily.   mineral oil enema Place 1 enema rectally as needed for severe constipation.   multivitamin with minerals Tabs tablet Take 1 tablet by mouth daily.   PEG 3350 Powd Take 17 g by mouth daily as needed for constipation.   senna-docusate 8.6-50 MG tablet Commonly known as:  Senokot-S Take 3 tablets by mouth at bedtime.   tizanidine 2 MG capsule Commonly known as:  ZANAFLEX Take 1 capsule (2 mg total) by mouth 3 (three) times daily.   warfarin 3 MG tablet Commonly known as:  COUMADIN Take 3 mg by mouth daily at 6 PM.      Follow-up Information    [name], MD. Schedule an appointment as soon as possible for a visit in 2 week(s).   Specialty:  Internal Medicine Contact information: [address] [place] [address] [phone]            Consultations:  General surgery   Procedures/Studies:  Ct Abdomen Pelvis W Contrast  Result Date: 02/14/2018 CLINICAL DATA:  Abdominal distension with nausea vomiting since yesterday. History of constipation. EXAM: CT ABDOMEN AND PELVIS WITH CONTRAST TECHNIQUE: Multidetector CT imaging of the abdomen and pelvis was performed using the standard protocol following bolus administration of intravenous contrast. CONTRAST:  164mL OMNIPAQUE IOHEXOL 300 MG/ML  SOLN COMPARISON:  12/14/2017 FINDINGS: Lower chest: Dependent opacity at the lung bases, right lower lobe most prominently, consistent with atelectasis. Hepatobiliary: Liver normal in size. No mass or focal lesion. There are 2 discrete gallstones. These are stable. No evidence of acute cholecystitis. No bile duct dilation. Pancreas: Unremarkable. No pancreatic ductal dilatation or surrounding inflammatory changes. Spleen: Normal in size without focal abnormality. Adrenals/Urinary Tract: No adrenal masses. Mild bilateral renal cortical thinning. Small nonobstructing stone in the upper pole the right kidney. Two left and 1 tiny right low-density renal masses most likely cysts. No other masses. No hydronephrosis. Normal ureters. Bladder is unremarkable. Stomach/Bowel: There is distended small bowel throughout most of the abdomen as well as moderate gastric distention. Maximum small bowel dilation is  6 cm. And exact transition point is not defined. Colon is normal in caliber, but fluid-filled. The colon is mostly fluid-filled. The rectum and sigmoid demonstrate stool. There is no bowel wall thickening and no adjacent mesenteric inflammation. An anastomosis staple line is noted along the medial margin the lower ascending colon/cecum. Vascular/Lymphatic: No aortic aneurysm or atherosclerosis. 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 No pathologically enlarged lymph nodes. Reproductive:  Unremarkable. Other: No ascites.  No hernia. Musculoskeletal: No fracture or acute finding. No osteoblastic or osteolytic lesions. IMPRESSION: 1. Findings are similar to the prior CT. There is significant small bowel distension, without colonic dilation. The exact point of transition is not defined. Findings are consistent with a distal partial small bowel obstruction. 2. No other acute findings within the abdomen or pelvis. 3. 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 4. Stable gallstones. 5. Right greater than left lung base opacity most consistent with atelectasis. Consider right lower lobe pneumonia or aspiration pneumonitis if there are consistent clinical findings. Electronically Signed   By: Lajean Manes M.D.   On: 02/14/2018 12:29   Dg Chest Port 1 View  Result Date: 02/14/2018 CLINICAL DATA:  Fever and abdominal pain EXAM: PORTABLE CHEST 1 VIEW COMPARISON:  12/24/2017 chest radiograph. FINDINGS: Low lung volumes. Stable cardiomediastinal silhouette with top-normal heart size. No pneumothorax. No pleural effusion. No pulmonary edema. Mild left basilar scarring versus atelectasis, decreased. No acute consolidative airspace disease. Mild gaseous distention of the visualized stomach. Cholelithiasis. IMPRESSION: Low lung volumes. Mild left basilar scarring versus atelectasis, decreased. Otherwise no active disease in the chest. Mild gaseous distention of the visualized stomach. Cholelithiasis. Electronically Signed   By: Ilona Sorrel M.D.   On: 02/14/2018 11:06      Subjective: - no chest pain, shortness of breath, no abdominal pain, nausea or vomiting. Excited to go home   Discharge Exam: Vitals:   02/18/18 0630 02/18/18 1406  BP: 110/69 136/72  Pulse: 83 90  Resp: (!) 24 18  Temp: (!) 97.5 F (36.4 C) 97.6 F (36.4 C)  SpO2: 100% 100%    General: Pt is alert, awake, not in acute distress Cardiovascular: RRR, S1/S2 +, no rubs, no gallops Respiratory: CTA  bilaterally, no wheezing, no rhonchi Abdominal: Soft, NT, ND, bowel sounds + Extremities: no edema, no cyanosis    The results of significant diagnostics from this hospitalization (including imaging, microbiology, ancillary and laboratory) are listed below for reference.     Microbiology: Recent Results (from the past 240 hour(s))  Blood Culture (routine x 2)     Status: None (Preliminary result)   Collection Time: 02/14/18  9:38 AM  Result Value Ref Range Status   Specimen Description BLOOD BLOOD LEFT WRIST  Final   Special Requests   Final    BOTTLES DRAWN AEROBIC AND ANAEROBIC Blood Culture results may not be optimal due to an inadequate volume of blood received in culture bottles   Culture   Final    NO GROWTH 4 DAYS Performed at [facility], [place] [address] [place], [place] [address]    Report Status PENDING  Incomplete  Blood Culture (routine x 2)     Status: None (Preliminary result)   Collection Time: 02/14/18  9:39 AM  Result Value Ref Range Status   Specimen Description BLOOD BLOOD RIGHT FOREARM  Final   Special Requests   Final    BOTTLES DRAWN AEROBIC  AND ANAEROBIC Blood Culture adequate volume   Culture   Final    NO GROWTH 4 DAYS Performed at [facility] [address] [place], [place] [address]    Report Status PENDING  Incomplete  Urine culture     Status: Abnormal   Collection Time: 02/14/18  1:05 PM  Result Value Ref Range Status   Specimen Description URINE, RANDOM  Final   Special Requests NONE  Final   Culture (A)  Final    <10,000 COLONIES/mL INSIGNIFICANT GROWTH Performed at [facility], [place] [address], [place], [place] [address]    Report Status 02/15/2018 FINAL  Final     Labs: BNP (last 3 results) No results for input(s): BNP in the last 8760 hours. Basic Metabolic Panel: Recent Labs  Lab 02/14/18 0938 02/14/18 0946 02/15/18 0422  NA 141 139 142  K 3.9 3.8 4.6  CL 102 104 110  CO2 22  --  18*  GLUCOSE 138* 134*  100*  BUN 12 12 9   CREATININE 1.18 0.90 0.92  CALCIUM 9.3  --  7.9*   Liver Function Tests: Recent Labs  Lab 02/14/18 0938 02/15/18 0422  AST 21 28  ALT 18 10  ALKPHOS 60 47  BILITOT 1.6* 1.7*  PROT 7.3 5.7*  ALBUMIN 3.7 2.9*   No results for input(s): LIPASE, AMYLASE in the last 168 hours. No results for input(s): AMMONIA in the last 168 hours. CBC: Recent Labs  Lab 02/14/18 0938 02/14/18 0946 02/15/18 0422  WBC 10.4  --  5.8  NEUTROABS 8.3*  --   --   HGB 14.2 15.0 12.4*  HCT 45.1 44.0 40.0  MCV 90.9  --  95.9  PLT 299  --  220   Cardiac Enzymes: No results for input(s): CKTOTAL, CKMB, CKMBINDEX, TROPONINI in the last 168 hours. BNP: Invalid input(s): POCBNP CBG: No results for input(s): GLUCAP in the last 168 hours. D-Dimer No results for input(s): DDIMER in the last 72 hours. Hgb A1c No results for input(s): HGBA1C in the last 72 hours. Lipid Profile No results for input(s): CHOL, HDL, LDLCALC, TRIG, CHOLHDL, LDLDIRECT in the last 72 hours. Thyroid function studies No results for input(s): TSH, T4TOTAL, T3FREE, THYROIDAB in the last 72 hours.  Invalid input(s): FREET3 Anemia work up No results for input(s): VITAMINB12, FOLATE, FERRITIN, TIBC, IRON, RETICCTPCT in the last 72 hours. Urinalysis    Component Value Date/Time   COLORURINE AMBER (A) 02/14/2018 1305   APPEARANCEUR HAZY (A) 02/14/2018 1305   LABSPEC 1.032 (H) 02/14/2018 1305   PHURINE 5.0 02/14/2018 1305   GLUCOSEU NEGATIVE 02/14/2018 1305   HGBUR NEGATIVE 02/14/2018 1305   BILIRUBINUR SMALL (A) 02/14/2018 1305   BILIRUBINUR negative 05/11/2015 1041   KETONESUR 80 (A) 02/14/2018 1305   PROTEINUR 30 (A) 02/14/2018 1305   UROBILINOGEN 1.0 05/11/2015 1041   UROBILINOGEN 0.2 06/03/2011 1809   NITRITE NEGATIVE 02/14/2018 1305   LEUKOCYTESUR NEGATIVE 02/14/2018 1305   Sepsis Labs Invalid input(s): PROCALCITONIN,  WBC,  LACTICIDVEN   Time coordinating discharge: 35  minutes  SIGNED:  Marzetta Board, MD  [facility] 02/18/2018, 3:54 PM

## 2018-02-18 NOTE — Progress Notes (Signed)
PT Cancellation Note  Patient Details Name: Aquila Delaughter MRN: [id number] DOB: 1980/02/08   Cancelled Treatment:    Reason Eval/Treat Not Completed: Patient declined, no reason specified    Mr. Lady declined transfer practice, education in mechanical lift, and he also declined HHPT;   Updated Case Mgr and RN;  Noted for discharge today;   Roney Marion, PT  Acute Rehabilitation Services Pager [phone] Office [phone]      Colletta [place] 02/18/2018, 10:08 AM

## 2018-02-18 NOTE — Progress Notes (Signed)
ANTICOAGULATION CONSULT NOTE  Pharmacy Consult:  Coumadin Indication: atrial fibrillation  Allergies  Allergen Reactions  . Betadine [Povidone Iodine] Other (See Comments)    Unknown reaction per caregiver  . Ciprofloxacin Other (See Comments)    Unknown reaction per caregiver  . Morphine Other (See Comments)    Unknown reaction per caregiver    Patient Measurements: Height: 5\' 5"  (165.1 cm) Weight: 293 lb 6.9 oz (133.1 kg) IBW/kg (Calculated) : 61.5  Vital Signs: Temp: 97.5 F (36.4 C) (01/22 0630) Temp Source: Oral (01/22 0630) BP: 110/69 (01/22 0630) Pulse Rate: 83 (01/22 0630)  Labs: Recent Labs    02/17/18 0623 02/17/18 1004 02/18/18 0800  LABPROT 32.3* 20.9* 20.4*  INR 3.21 1.83 1.77    Estimated Creatinine Clearance: 138.7 mL/min (by C-G formula based on SCr of 0.92 mg/dL).   Assessment: 83 YOM admitted with abdominal pain on Coumadin PTA for PAF and history of DVT. INR was subtherapeutic on admission after holding for dental procedures (last dose 1/17 afterwards) and was held on admit with NPO status and possible need for surgical procedures.  Coumadin resumed per Pharmacy on 02/15/18.  Flagyl likely cleared from patient's system.  INR sub-therapeutic.  No bleeding reported.  Home dose: 3mg  PO daily  Goal of Therapy:  INR 2-3 Monitor platelets by anticoagulation protocol: Yes   Plan:  Coumadin 5mg  PO today Daily PT / INR    Lujain Kraszewski D. Mina Marble, PharmD, BCPS, [place] 02/18/2018, 1:27 PM

## 2018-02-19 LAB — CULTURE, BLOOD (ROUTINE X 2)
Culture: NO GROWTH
Culture: NO GROWTH
Special Requests: ADEQUATE

## 2018-03-04 ENCOUNTER — Telehealth: Payer: Self-pay | Admitting: *Deleted

## 2018-03-04 NOTE — Telephone Encounter (Signed)
Fax confirmation received for copies of prescription (order report) on baclofen, tizanidine and cyclobenzaprine.  Spoke to shasaty at [facility] [phone], fax [phone] for clarification on what she needed.  Not prescriptions but order report stating dates prescribed and expirations meds what dose.

## 2018-03-30 ENCOUNTER — Encounter: Payer: Self-pay | Admitting: Nurse Practitioner

## 2018-06-25 ENCOUNTER — Telehealth: Payer: Self-pay | Admitting: Nurse Practitioner

## 2018-06-25 NOTE — Telephone Encounter (Signed)
06-25-18 Pt has called (with care taker [name]) Pt gave verbal consent to file insurance for [email or web address] on 06-03 E-mail confirmed as [email or web address]  Pt understands that although there may be some limitations with this type of visit, we will take all precautions to reduce any security or privacy concerns.  Pt understands that this will be treated like an in office visit and we will file with pt's insurance, and there may be a patient responsible charge related to this service. *email being sent*

## 2018-07-01 ENCOUNTER — Ambulatory Visit (INDEPENDENT_AMBULATORY_CARE_PROVIDER_SITE_OTHER): Payer: Medicare Other | Admitting: Neurology

## 2018-07-01 ENCOUNTER — Other Ambulatory Visit: Payer: Self-pay

## 2018-07-01 ENCOUNTER — Encounter: Payer: Self-pay | Admitting: Neurology

## 2018-07-01 DIAGNOSIS — R252 Cramp and spasm: Secondary | ICD-10-CM

## 2018-07-01 MED ORDER — DIAZEPAM 2 MG PO TABS: 2.0000 mg | ORAL_TABLET | Freq: Two times a day (BID) | ORAL | 5 refills | Status: DC

## 2018-07-01 MED ORDER — BACLOFEN 20 MG PO TABS: 20.0000 mg | ORAL_TABLET | Freq: Four times a day (QID) | ORAL | 3 refills | Status: DC

## 2018-07-01 MED ORDER — CYCLOBENZAPRINE HCL 10 MG PO TABS: ORAL_TABLET | ORAL | 3 refills | Status: DC

## 2018-07-01 MED ORDER — TIZANIDINE HCL 2 MG PO CAPS: 2.0000 mg | ORAL_CAPSULE | Freq: Three times a day (TID) | ORAL | 3 refills | Status: DC

## 2018-07-01 NOTE — Progress Notes (Signed)
Virtual Visit via Video Note  I connected with [name] on 07/01/18 at  1:45 PM EDT by a video enabled telemedicine application and verified that I am speaking with the correct person using two identifiers.  Location: Patient: At his home Provider: In the office    I discussed the limitations of evaluation and management by telemedicine and the availability of in person appointments. The patient expressed understanding and agreed to proceed.  History of Present Illness: HISTORY OF PRESENT ILLNESS:YYChristopher [name] is a 38 years old right-handed male, accompanied by his caregiver [name] for evaluation of spasticity. He was born with cerebral palsy, has been wheelchair-bound, now lives alone, with his caregiver lives in with him, his father, stepmother, grandmother, is actively involved in his care. 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 He suffered bilateral lower extremity DVT in 2000, and 2005,on chronic Coumadin treatment, Over the years, his spasticity has been controlled by medications, baclofen 20 mg 4 times a day, Valium 2 mg twice a day, Flexeril 10 mg 3 times a day, but a spasticity has gradually worsened over time, sometimes it was so forceful, he ripped the arm rest  UPDATE May 19 2014:YYHe is with his caregiver daily, he still works full time, he likes current medication changes, baclofen 20 mg 4 times a day, Valium 2 mg twice a day, Flexeril 10 mg 3 times a day, I have added on tizanidine 2 mg 3 times a day, works well,  UPDATE  12/06/14:YYChristopher returns with his caregiver. He is having more spasticity first thing in the morning and before bed. He is currently on baclofen 20 mg, 4 times a day, Flexeril 10 mg 3 times a day, Valium 2 mg twice daily and tizanidine 2 mg 3 times daily. He returns for reevaluation  UPDATE Dec 6th 2017:YY He still works fulltime, Medical sales representative work, data entry, he wears helmet, his spasticity is under good conrol, he is now baclofen 20mg  4 times a day, valium 2mg  twice, flexeril 10mg /10/20mg , tizanidine 2mg  tid.  UPDATE 06/19/2018CM [name], 38 year old male returns for follow-up with his caregiver. He continues to live in an alternative family situation. He has a history of CP and significant spasticity which is currently well-controlled. He is wheelchair-bound. He does have some control of his right upper extremity minimal use of his lower extremities. 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 Appetite is good and he sleeps well  UPDATE 6/25/ 2019CM [name], 38 year old male returns for follow-up with his caregiver.  He has a history of significant spasticity and cerebral palsy.  His symptoms are well controlled on medications.  He is wheelchair-bound.  He continues to live in an alternative family situation with 24/7 care.  He does have some control of his right upper extremity minimal use of lower extremities.  [facility].  Appetite is good and he is sleeping well.  He had recent ultrasound for DVT due to thigh swelling.  They do not know the results.  Encouraged to check with primary care who ordered this.  He remains on Coumadin most recent  INR this week 2.1.  He returns for reevaluation  Update 07/01/2018 SS: [name] 38 year old male history of spasticity and cerebral palsy. He is taking Flexeril 10 mg (1 tablet at 8 AM, 12 PM, 2 tablets at 8 PM), valium 2 mg twice daily, Zanaflex 2 mg three times daily, baclofen 20 mg 4 times daily   Video visit with  [name] and his caregiver [name], they live in an apartment.  He has been doing very well.  He has not been going to his day program due to the COVID-19.  He is currently in a wheelchair, during the day his wrist are strapped down, has more spasticity in his left arm.  Has some movement of his right arm.  He has a good appetite, caregivers feed him.  He was recently in the hospital for a small bowel obstruction.  There are no new problems or concerns.  His spasticity medication regimen is working well for him.   Observations/Objective: Video visit, is alert, answers questions with assistance, some difficulty understanding him, able to move his right arm, right leg, in a wheelchair full-time  Assessment and Plan: 1. Cerebral Palsy with severe spastic quadriplegia  Continue current regimen for spasticity: -Baclofen 20 mg 4 times daily (8 am, 12, 3 pm, 8 pm) -Flexeril 10 mg, 8 am, 12pm, 2 tabs bedtime -Valium 2 mg, 8 am, 8 pm -Zanaflex 2 mg (8am, 12 pm, 8 pm)  I will send an AVS in the mail for documentation purposes to home address.  I will send refills of his medications.  Follow Up Instructions: I have made him an appointment for 6 months December 9 at 815.   I discussed the assessment and treatment plan with the patient. The patient was provided an opportunity to ask questions and all were answered. The patient agreed with the plan and demonstrated an understanding of the instructions.   The patient was advised to call back or seek an in-person evaluation if the symptoms worsen or if the condition fails to improve as anticipated.  I provided 15 minutes of non-face-to-face time during this encounter.   Evangeline Dakin, DNP  [facility] [address] [place], [place] [address] [phone]

## 2018-07-01 NOTE — Patient Instructions (Signed)
Continue current regimen for spasticity: -Baclofen 20 mg 4 times daily (8 am, 12, 3 pm, 8 pm) -Flexeril 10 mg, 8 am, 12pm, 2 tabs bedtime -Valium 2 mg, 8 am, 8 pm -Zanaflex 2 mg (8am, 12 pm, 8 pm)  I will send in refills. I have made a 6 month f/u 12/9 8:15 am

## 2018-07-03 NOTE — Progress Notes (Signed)
I have reviewed and agreed above plan. 

## 2018-07-21 ENCOUNTER — Telehealth: Payer: Self-pay

## 2018-07-21 ENCOUNTER — Telehealth: Payer: Self-pay | Admitting: Neurology

## 2018-07-21 NOTE — Telephone Encounter (Signed)
Shasity Taylor@ [facility] is calling for current or new orders re: pt's:tizanidine (ZANAFLEX) 2 MG capsule,baclofen (LIORESAL) 20 MG tablet & cyclobenzaprine (FLEXERIL) 10 MG tablet

## 2018-07-21 NOTE — Telephone Encounter (Signed)
I sent refills of the medication in 07/01/2018. Please find out what is needed. I also sent AVS in the mail for documentation purposes.

## 2018-07-21 NOTE — Telephone Encounter (Signed)
Opened in error

## 2018-07-22 NOTE — Telephone Encounter (Signed)
Spoke with [employer] and she stated that he patient attends a day program. She stated that she needs a copy of the order with the written date and expiration date.

## 2018-07-22 NOTE — Telephone Encounter (Signed)
Orders have been faxed to [facility] attention Leonie Douglas at [phone]. Confirmation fax has been received.

## 2018-07-27 ENCOUNTER — Ambulatory Visit: Payer: 59 | Admitting: Nurse Practitioner

## 2018-11-24 NOTE — Progress Notes (Signed)
PATIENT: [name] DOB: 1980/09/08  REASON FOR VISIT: follow up HISTORY FROM: patient  HISTORY OF PRESENT ILLNESS: Today 11/25/18  HISTORY  HISTORY OF PRESENT ILLNESS:YYChristopher Walker is a 38 years old right-handed male, accompanied by his caregiver Daleen Snook for evaluation of spasticity. He was born with cerebral palsy, has been wheelchair-bound, now lives alone, with his caregiver lives in with him, his father, stepmother, grandmother, is actively involved in his care. 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 He suffered bilateral lower extremity DVT in 2000, and 2005,on chronic Coumadin treatment, Over the years, his spasticity has been controlled by medications, baclofen 20 mg 4 times a day, Valium 2 mg twice a day, Flexeril 10 mg 3 times a day, but a spasticity has gradually worsened over time, sometimes it was so forceful, he ripped the arm rest  UPDATE May 19 2014:YYHe is with his caregiver daily, he still works full time, he likes current medication changes, baclofen 20 mg 4 times a day, Valium 2 mg twice a day, Flexeril 10 mg 3 times a day, I have added on tizanidine 2 mg 3 times a day, works well,  UPDATE 12/06/14:YYChristopher returns with his caregiver. He is having more spasticity first thing in the morning and before bed. He is currently on baclofen 20 mg, 4 times a day, Flexeril 10 mg 3 times a day, Valium 2 mg twice daily and tizanidine 2 mg 3 times daily. He returns for reevaluation  UPDATE Dec 6th 2017:YY He still works fulltime,  Medical sales representative work, data entry, he wears helmet, his spasticity is under good conrol, he is now baclofen 20mg  4 times a day, valium 2mg  twice, flexeril 10mg /10/20mg , tizanidine 2mg  tid.  UPDATE 06/19/2018CMMr.80, 38 year old male returns for follow-up with his caregiver. He continues to live in an alternative family situation. He has a history of CP and significant spasticity which is currently well-controlled. He is wheelchair-bound. He does have some control of his right upper extremity minimal use of his lower extremities. 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 Appetite is good and he sleeps well  UPDATE6/25/2019CMMr.Greggs,38 year old male returns for follow-up with his caregiver. He has a history of significant spasticity and cerebral palsy. His symptoms are well controlled on medications. He is wheelchair-bound. He continues to live in an alternative family situation with 24/7 care. He does have some control of his right upper extremity minimal use of lower extremities. [facility]. Appetite is good and he is sleeping well. He had recent ultrasound for DVT due to thigh swelling. They do not know the results. Encouraged to check with primary care who ordered this. He remains on Coumadin most recent INR this week 2.1.He returns for reevaluation  Update 07/01/2018 SS: Mr. Agreda 38 year old male history of spasticity and cerebral palsy. He is taking Flexeril 10 mg (1 tablet at 8 AM, 12 PM, 2 tablets at 8 PM), valium 2 mg twice daily, Zanaflex 2 mg three times daily, baclofen 20 mg 4 times daily   Video visit with Michiah and his caregiver [name], they live in an apartment.  He has been doing very well.  He has not been going to his day program due to the COVID-19.  He is currently in a wheelchair, during the day his wrist are strapped down, has more spasticity in his left arm.  Has some movement of his right arm.  He has a good appetite,  caregivers feed him.  He was recently in the hospital for a small bowel obstruction.  There are no new problems or concerns.  His spasticity medication regimen is working well for him.  Update November 25, 2018 SS: Hamdan is here today with his caregiver, [name].  He indicates he feels like he is having more spasms in his left arm, worse in the afternoon.  He is sleeping well.  He is taking scheduled Flexeril, Valium, baclofen, and tizanidine.  He says he has been on the same medication regimen for awhile.  No new health problems have come up in the interim.   REVIEW OF SYSTEMS: Out of a complete 14 system review of symptoms, the patient complains only of the following symptoms, and all other reviewed systems are negative.  Muscle spasms  ALLERGIES: Allergies  Allergen Reactions  . Betadine [Povidone Iodine] Other (See Comments)    Unknown reaction per caregiver  . Ciprofloxacin Other (See Comments)    Unknown reaction per caregiver  . Morphine Other (See Comments)    Unknown reaction per caregiver    HOME MEDICATIONS: Outpatient Medications Prior to Visit  Medication Sig Dispense Refill  . acetaminophen (TYLENOL) 325 MG tablet Take 650 mg by mouth every 6 (six) hours as needed. For pain    . baclofen (LIORESAL) 20 MG tablet Take 1 tablet (20 mg total) by mouth 4 (four) times daily. 360 each 3  . calcium carbonate (TUMS - DOSED IN MG ELEMENTAL CALCIUM) 500 MG chewable tablet Chew 1 tablet by mouth 3 (three) times daily.    . cetirizine (ZYRTEC) 10 MG tablet Take 10 mg by mouth daily.    . cholecalciferol (VITAMIN D) 1000 UNITS tablet Take 1,000 Units by mouth 2 (two) times daily.    . cyclobenzaprine (FLEXERIL) 10 MG tablet 1 at 8am and 12pm and 2 tabs at 8pm. 360 tablet 3  . diazepam (VALIUM) 2 MG tablet Take 1 tablet (2 mg total) by mouth 2 (two) times daily. Must last 30 days. 60 tablet 5  . diphenhydrAMINE (BENADRYL) 25 MG tablet Take 25 mg by mouth every 6 (six) hours as needed  for allergies.    . fluticasone (VERAMYST) 27.5 MCG/SPRAY nasal spray Place 1 spray into the nose at bedtime as needed for rhinitis.    . hydrochlorothiazide (HYDRODIURIL) 25 MG tablet Take 12.5 mg by mouth daily.     Marland Kitchen loperamide (IMODIUM) 2 MG capsule Take 2 mg by mouth 3 (three) times daily as needed for diarrhea or loose stools. Reported on 05/11/2015    . metoprolol (TOPROL-XL) 100 MG 24 hr tablet Take 100 mg by mouth daily.     . mineral oil enema Place 1 enema rectally as needed for severe constipation.    . Multiple Vitamin (MULITIVITAMIN WITH MINERALS) TABS Take 1 tablet by mouth daily.    . Polyethylene Glycol 3350 (PEG 3350) POWD Take 17 g by mouth daily as needed for constipation.    . senna-docusate (SENOKOT-S) 8.6-50 MG tablet Take 3 tablets by mouth at bedtime.    Marland Kitchen warfarin (COUMADIN) 3 MG tablet Take 3 mg by mouth daily at 6 PM.     .  tizanidine (ZANAFLEX) 2 MG capsule Take 1 capsule (2 mg total) by mouth 3 (three) times daily. 270 capsule 3   No facility-administered medications prior to visit.     PAST MEDICAL HISTORY: Past Medical History:  Diagnosis Date  . Cerebral palsy ([place])   . Chronic constipation   . Coagulopathy ([place])   . DVT (deep venous thrombosis) ([place])   . Hypertension   . Insomnia   . Recurrent UTI   . Tachycardia     PAST SURGICAL HISTORY: Past Surgical History:  Procedure Laterality Date  . ELBOW SURGERY  09/12/10   left  . INCISE AND DRAIN ABCESS  09/11/10  . LAPAROTOMY N/A 12/15/2017   Procedure: EXPLORATORY LAPAROTOMY;  Surgeon: Kieth Brightly Arta Bruce, MD;  Location: [facility];  Service: General;  Laterality: N/A;  . SMALL INTESTINE SURGERY    . TOOTH EXTRACTION  11/22/2011   Procedure: DENTAL RESTORATION/EXTRACTIONS;  Surgeon: Marcelo Baldy, MD;  Location: [facility];  Service: Dentistry;  Laterality: N/A;  dental restoration and cleaning; NO EXTRACTIONS    FAMILY HISTORY: Family History  Family history unknown: Yes    SOCIAL HISTORY: Social  History   Socioeconomic History  . Marital status: Single    Spouse name: Not on file  . Number of children: Not on file  . Years of education: Not on file  . Highest education level: Not on file  Occupational History  . Not on file  Social Needs  . Financial resource strain: Not on file  . Food insecurity    Worry: Not on file    Inability: Not on file  . Transportation needs    Medical: No    Non-medical: No  Tobacco Use  . Smoking status: Never Smoker  . Smokeless tobacco: Never Used  Substance and Sexual Activity  . Alcohol use: No  . Drug use: No  . Sexual activity: Never  Lifestyle  . Physical activity    Days per week: Not on file    Minutes per session: Not on file  . Stress: Not on file  Relationships  . Social Herbalist on phone: Patient refused    Gets together: Patient refused    Attends religious service: Patient refused    Active member of club or organization: Patient refused    Attends meetings of clubs or organizations: Patient refused    Relationship status: Patient refused  . Intimate partner violence    Fear of current or ex partner: Not on file    Emotionally abused: Not on file    Physically abused: Not on file    Forced sexual activity: Not on file  Other Topics Concern  . Not on file  Social History Narrative  . Not on file   PHYSICAL EXAM  Vitals:   11/25/18 1310  BP: 121/81  Pulse: 89  Temp: 98.8 F (37.1 C)  TempSrc: Oral  Height: 5\' 5"  (1.651 m)   Body mass index is 48.83 kg/m.  Generalized: Well developed, in no acute distress   Neurological examination  Mentation: Alert, speech is dysarthric, able to follow commands Cranial nerve II-XII: Pupils were equal round reactive to light. Extraocular movements were full, visual field were full on confrontational test. Facial sensation and strength were normal.  Head turning and shoulder shrug  were normal and symmetric. Motor: Significant spasticity of upper and lower  extremities, right arm is strapped to wheelchair with frequent movement, trace movement of left upper extremity significant spasticity Sensory: Sensory  testing is intact to soft touch on all 4 extremities. No evidence of extinction is noted.  Coordination: Unable to perform Gait and station: Is not able to ambulate Reflexes: Deep tendon reflexes are symmetric decreased  DIAGNOSTIC DATA (LABS, IMAGING, TESTING) - I reviewed patient records, labs, notes, testing and imaging myself where available.  Lab Results  Component Value Date   WBC 5.8 02/15/2018   HGB 12.4 (L) 02/15/2018   HCT 40.0 02/15/2018   MCV 95.9 02/15/2018   PLT 220 02/15/2018      Component Value Date/Time   NA 142 02/15/2018 0422   K 4.6 02/15/2018 0422   CL 110 02/15/2018 0422   CO2 18 (L) 02/15/2018 0422   GLUCOSE 100 (H) 02/15/2018 0422   BUN 9 02/15/2018 0422   CREATININE 0.92 02/15/2018 0422   CREATININE 1.00 07/26/2015 1426   CALCIUM 7.9 (L) 02/15/2018 0422   PROT 5.7 (L) 02/15/2018 0422   ALBUMIN 2.9 (L) 02/15/2018 0422   AST 28 02/15/2018 0422   ALT 10 02/15/2018 0422   ALKPHOS 47 02/15/2018 0422   BILITOT 1.7 (H) 02/15/2018 0422   GFRNONAA >60 02/15/2018 0422   GFRNONAA >89 07/26/2015 1426   GFRAA >60 02/15/2018 0422   GFRAA >89 07/26/2015 1426   Lab Results  Component Value Date   CHOL  09/30/2008    87        ATP III CLASSIFICATION:  <200     mg/dL   Desirable  200-239  mg/dL   Borderline High  >=240    mg/dL   High          TRIG 140 12/22/2017   No results found for: HGBA1C No results found for: VITAMINB12 Lab Results  Component Value Date   TSH 0.225 (L) 06/03/2011    ASSESSMENT AND PLAN 38 y.o. year old male  has a past medical history of Cerebral palsy ([place]), Chronic constipation, Coagulopathy ([place]), DVT (deep venous thrombosis) ([place]), Hypertension, Insomnia, Recurrent UTI, and Tachycardia. here with:  1.  Cerebral palsy with severe spastic quadriplegia -Reports increased  spasticity to left upper extremity in the afternoon -Increase Zanaflex 2 mg 8 AM, 4 mg 12 PM, 2 mg 8 PM, may increase evening dose as well if needed in the future -Continue baclofen 20 mg 4 times a day (8, 12, 3, 8), already at max dose -Continue Flexeril 10 mg, 8 AM, 12 PM, 2 tabs at bedtime -Continue Valium 2 mg, 8 AM, 8 PM, we may add in a midday dose if needed  -I will fax a printed prescription to his pharmacy, his caregiver [name], was given a voided copy of the prescription for his records, and additional copy will be sent with day program -He will follow-up in 6 months with Dr. Krista Blue or sooner if needed  I spent 15 minutes with the patient. 50% of this time was spent discussing his plan of care.  Butler Denmark, AGNP-C, DNP 11/25/2018, 1:40 PM [facility] [address], [place], [place] [address] [phone]

## 2018-11-25 ENCOUNTER — Encounter: Payer: Self-pay | Admitting: Neurology

## 2018-11-25 ENCOUNTER — Telehealth: Payer: Self-pay

## 2018-11-25 ENCOUNTER — Other Ambulatory Visit: Payer: Self-pay

## 2018-11-25 ENCOUNTER — Ambulatory Visit (INDEPENDENT_AMBULATORY_CARE_PROVIDER_SITE_OTHER): Payer: Medicare Other | Admitting: Neurology

## 2018-11-25 VITALS — BP 121/81 | HR 89 | Temp 98.8°F | Ht 65.0 in

## 2018-11-25 DIAGNOSIS — R252 Cramp and spasm: Secondary | ICD-10-CM

## 2018-11-25 MED ORDER — TIZANIDINE HCL 2 MG PO CAPS: ORAL_CAPSULE | ORAL | 3 refills | Status: DC

## 2018-11-25 NOTE — Progress Notes (Signed)
Faxed over a paper script to pts pharmacy.   Faxed over a Void Paper script to [facility]. (Pt request for documental purposes)  Medication: Tinazadine (zanaflex) 2MG .  Directions per script: Take 1 in the morning, Take 2 at 12 P.M., Take 1 in the evening.

## 2018-11-25 NOTE — Telephone Encounter (Signed)
Faxed over a paper script to pts pharmacy.   Faxed over a Void Paper script to [facility]. (Pt request for documental purposes)  Medication: Tinazadine (zanaflex) 2MG .  Directions per script: Take 1 in the morning, Take 2 at 12 P.M., Take 1 in the evening.

## 2018-11-25 NOTE — Patient Instructions (Signed)
Increase Tizanidine take 2 mg in the morning, 4 mg at 12 pm, 2 mg at 8 pm. If this is not helpful, we may increase the evening dose as well.

## 2018-11-26 NOTE — Progress Notes (Signed)
I have reviewed and agreed above plan. 

## 2018-11-30 NOTE — Telephone Encounter (Signed)
Script needs to be sent to     [facility], [place] [address]

## 2018-12-02 NOTE — Telephone Encounter (Signed)
Refaxed paper script to pharmacy. (2nd time)  [employer].

## 2018-12-21 ENCOUNTER — Other Ambulatory Visit: Payer: Self-pay | Admitting: Neurology

## 2019-01-06 ENCOUNTER — Ambulatory Visit: Payer: Medicare Other | Admitting: Neurology

## 2019-02-16 ENCOUNTER — Inpatient Hospital Stay (HOSPITAL_COMMUNITY): Payer: Medicare Other

## 2019-02-16 ENCOUNTER — Emergency Department (HOSPITAL_COMMUNITY): Payer: Medicare Other

## 2019-02-16 ENCOUNTER — Inpatient Hospital Stay (HOSPITAL_COMMUNITY)
Admission: EM | Admit: 2019-02-16 | Discharge: 2019-02-25 | DRG: 388 | Disposition: A | Discharge status: Home / Self Care | Payer: Medicare Other | Attending: General Surgery | Admitting: General Surgery

## 2019-02-16 DIAGNOSIS — Z7901 Long term (current) use of anticoagulants: Secondary | ICD-10-CM | POA: Diagnosis not present

## 2019-02-16 DIAGNOSIS — D689 Coagulation defect, unspecified: Secondary | ICD-10-CM | POA: Diagnosis present

## 2019-02-16 DIAGNOSIS — Z6826 Body mass index (BMI) 26.0-26.9, adult: Secondary | ICD-10-CM

## 2019-02-16 DIAGNOSIS — K219 Gastro-esophageal reflux disease without esophagitis: Secondary | ICD-10-CM | POA: Diagnosis present

## 2019-02-16 DIAGNOSIS — E43 Unspecified severe protein-calorie malnutrition: Secondary | ICD-10-CM | POA: Diagnosis present

## 2019-02-16 DIAGNOSIS — Z87440 Personal history of urinary (tract) infections: Secondary | ICD-10-CM | POA: Diagnosis not present

## 2019-02-16 DIAGNOSIS — G809 Cerebral palsy, unspecified: Secondary | ICD-10-CM | POA: Diagnosis present

## 2019-02-16 DIAGNOSIS — Z993 Dependence on wheelchair: Secondary | ICD-10-CM

## 2019-02-16 DIAGNOSIS — G4700 Insomnia, unspecified: Secondary | ICD-10-CM | POA: Diagnosis present

## 2019-02-16 DIAGNOSIS — Z20822 Contact with and (suspected) exposure to covid-19: Secondary | ICD-10-CM | POA: Diagnosis present

## 2019-02-16 DIAGNOSIS — Z0189 Encounter for other specified special examinations: Secondary | ICD-10-CM

## 2019-02-16 DIAGNOSIS — Z79899 Other long term (current) drug therapy: Secondary | ICD-10-CM

## 2019-02-16 DIAGNOSIS — Z888 Allergy status to other drugs, medicaments and biological substances status: Secondary | ICD-10-CM

## 2019-02-16 DIAGNOSIS — M7989 Other specified soft tissue disorders: Secondary | ICD-10-CM | POA: Diagnosis not present

## 2019-02-16 DIAGNOSIS — I724 Aneurysm of artery of lower extremity: Secondary | ICD-10-CM | POA: Diagnosis present

## 2019-02-16 DIAGNOSIS — Z86718 Personal history of other venous thrombosis and embolism: Secondary | ICD-10-CM

## 2019-02-16 DIAGNOSIS — I10 Essential (primary) hypertension: Secondary | ICD-10-CM | POA: Diagnosis present

## 2019-02-16 DIAGNOSIS — K56609 Unspecified intestinal obstruction, unspecified as to partial versus complete obstruction: Principal | ICD-10-CM | POA: Diagnosis present

## 2019-02-16 DIAGNOSIS — Z4659 Encounter for fitting and adjustment of other gastrointestinal appliance and device: Secondary | ICD-10-CM

## 2019-02-16 DIAGNOSIS — Z885 Allergy status to narcotic agent status: Secondary | ICD-10-CM | POA: Diagnosis not present

## 2019-02-16 LAB — COMPREHENSIVE METABOLIC PANEL
ALT: 16 U/L (ref 0–44)
AST: 23 U/L (ref 15–41)
Albumin: 4.2 g/dL (ref 3.5–5.0)
Alkaline Phosphatase: 63 U/L (ref 38–126)
Anion gap: 15 (ref 5–15)
BUN: 12 mg/dL (ref 6–20)
CO2: 29 mmol/L (ref 22–32)
Calcium: 9.5 mg/dL (ref 8.9–10.3)
Chloride: 97 mmol/L — ABNORMAL LOW (ref 98–111)
Creatinine, Ser: 1.09 mg/dL (ref 0.61–1.24)
GFR calc Af Amer: >60 mL/min (ref >60)
GFR calc non Af Amer: >60 mL/min (ref >60)
Glucose, Bld: 146 mg/dL — ABNORMAL HIGH (ref 70–99)
Potassium: 4.1 mmol/L (ref 3.5–5.1)
Sodium: 141 mmol/L (ref 135–145)
Total Bilirubin: 1.1 mg/dL (ref 0.3–1.2)
Total Protein: 7.3 g/dL (ref 6.5–8.1)

## 2019-02-16 LAB — LACTIC ACID, PLASMA
Lactic Acid, Venous: 3.7 mmol/L (ref 0.5–1.9)
Lactic Acid, Venous: 4.8 mmol/L (ref 0.5–1.9)

## 2019-02-16 LAB — CBC WITH DIFFERENTIAL/PLATELET
Abs Immature Granulocytes: 0.04 10*3/uL (ref 0.00–0.07)
Basophils Absolute: 0 10*3/uL (ref 0.0–0.1)
Basophils Relative: 0 %
Eosinophils Absolute: 0 10*3/uL (ref 0.0–0.5)
Eosinophils Relative: 0 %
HCT: 53.8 % — ABNORMAL HIGH (ref 39.0–52.0)
Hemoglobin: 17.6 g/dL — ABNORMAL HIGH (ref 13.0–17.0)
Immature Granulocytes: 0 %
Lymphocytes Relative: 5 %
Lymphs Abs: 0.7 10*3/uL (ref 0.7–4.0)
MCH: 30.3 pg (ref 26.0–34.0)
MCHC: 32.7 g/dL (ref 30.0–36.0)
MCV: 92.8 fL (ref 80.0–100.0)
Monocytes Absolute: 1.3 10*3/uL — ABNORMAL HIGH (ref 0.1–1.0)
Monocytes Relative: 9 %
Neutro Abs: 11.8 10*3/uL — ABNORMAL HIGH (ref 1.7–7.7)
Neutrophils Relative %: 86 %
Platelets: 266 10*3/uL (ref 150–400)
RBC: 5.8 MIL/uL (ref 4.22–5.81)
RDW: 13.4 % (ref 11.5–15.5)
WBC: 13.9 10*3/uL — ABNORMAL HIGH (ref 4.0–10.5)
nRBC: 0 % (ref 0.0–0.2)

## 2019-02-16 LAB — POC SARS CORONAVIRUS 2 AG -  ED: SARS Coronavirus 2 Ag: NEGATIVE

## 2019-02-16 LAB — LIPASE, BLOOD: Lipase: 19 U/L (ref 11–51)

## 2019-02-16 LAB — PROTIME-INR
INR: 2.1 — ABNORMAL HIGH (ref 0.8–1.2)
Prothrombin Time: 23.4 seconds — ABNORMAL HIGH (ref 11.4–15.2)

## 2019-02-16 LAB — RESPIRATORY PANEL BY RT PCR (FLU A&B, COVID)
Influenza A by PCR: NEGATIVE
Influenza B by PCR: NEGATIVE
SARS Coronavirus 2 by RT PCR: NEGATIVE

## 2019-02-16 LAB — POC OCCULT BLOOD, ED: Fecal Occult Bld: NEGATIVE

## 2019-02-16 MED ORDER — SODIUM CHLORIDE 0.9 % IV BOLUS: 1000.0000 mL | Freq: Once | INTRAVENOUS | Status: DC

## 2019-02-16 MED ORDER — LACTATED RINGERS IV BOLUS
1000.0000 mL | Freq: Once | INTRAVENOUS | Status: AC
  Administered 2019-02-16: 23:00:00 1000 mL via INTRAVENOUS

## 2019-02-16 MED ORDER — PIPERACILLIN-TAZOBACTAM 3.375 G IVPB 30 MIN
3.3750 g | Freq: Once | INTRAVENOUS | Status: AC
  Administered 2019-02-16: 23:00:00 3.375 g via INTRAVENOUS
  Filled 2019-02-16: qty 50

## 2019-02-16 MED ORDER — DOCUSATE SODIUM 100 MG PO CAPS
100.0000 mg | ORAL_CAPSULE | Freq: Two times a day (BID) | ORAL | Status: DC
  Administered 2019-02-17 (×2): 100 mg via ORAL
  Filled 2019-02-16 (×2): qty 1

## 2019-02-16 MED ORDER — ONDANSETRON 4 MG PO TBDP: 4.0000 mg | ORAL_TABLET | Freq: Four times a day (QID) | ORAL | Status: DC | PRN

## 2019-02-16 MED ORDER — ONDANSETRON HCL 4 MG/2ML IJ SOLN
4.0000 mg | Freq: Once | INTRAMUSCULAR | Status: AC
  Administered 2019-02-16: 19:00:00 4 mg via INTRAVENOUS
  Filled 2019-02-16: qty 2

## 2019-02-16 MED ORDER — LACTATED RINGERS IV BOLUS
1000.0000 mL | Freq: Once | INTRAVENOUS | Status: AC
  Administered 2019-02-16: 22:00:00 1000 mL via INTRAVENOUS

## 2019-02-16 MED ORDER — HEPARIN (PORCINE) 25000 UT/250ML-% IV SOLN
1000.0000 [IU]/h | INTRAVENOUS | Status: DC
  Filled 2019-02-16 (×2): qty 250

## 2019-02-16 MED ORDER — ONDANSETRON HCL 4 MG/2ML IJ SOLN
4.0000 mg | Freq: Four times a day (QID) | INTRAMUSCULAR | Status: DC | PRN
  Administered 2019-02-16 – 2019-02-17 (×3): 4 mg via INTRAVENOUS
  Filled 2019-02-16 (×3): qty 2

## 2019-02-16 MED ORDER — DIATRIZOATE MEGLUMINE & SODIUM 66-10 % PO SOLN
90.0000 mL | Freq: Once | ORAL | Status: AC
  Administered 2019-02-16: 23:00:00 90 mL via NASOGASTRIC
  Filled 2019-02-16: qty 90

## 2019-02-16 MED ORDER — LACTATED RINGERS IV SOLN: INTRAVENOUS | Status: DC

## 2019-02-16 MED ORDER — ACETAMINOPHEN 650 MG RE SUPP
650.0000 mg | Freq: Four times a day (QID) | RECTAL | Status: DC | PRN
  Administered 2019-02-18: 02:00:00 650 mg via RECTAL
  Filled 2019-02-16: qty 1

## 2019-02-16 MED ORDER — METHOCARBAMOL 500 MG PO TABS: 500.0000 mg | ORAL_TABLET | Freq: Four times a day (QID) | ORAL | Status: DC | PRN

## 2019-02-16 MED ORDER — ACETAMINOPHEN 325 MG PO TABS
650.0000 mg | ORAL_TABLET | Freq: Four times a day (QID) | ORAL | Status: DC | PRN
  Filled 2019-02-16: qty 2

## 2019-02-16 MED ORDER — SODIUM CHLORIDE 0.9 % IV SOLN: INTRAVENOUS | Status: DC

## 2019-02-16 MED ORDER — SODIUM CHLORIDE 0.9 % IV BOLUS
1000.0000 mL | Freq: Once | INTRAVENOUS | Status: AC
  Administered 2019-02-16: 17:00:00 1000 mL via INTRAVENOUS

## 2019-02-16 MED ORDER — MIDAZOLAM HCL 2 MG/2ML IJ SOLN
0.5000 mg | INTRAMUSCULAR | Status: DC | PRN
  Administered 2019-02-16 – 2019-02-20 (×2): 0.5 mg via INTRAVENOUS
  Filled 2019-02-16 (×2): qty 2
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
## 2019-02-16 NOTE — Progress Notes (Addendum)
ANTICOAGULATION CONSULT NOTE - Initial Consult  Pharmacy Consult for Heparin Indication: VTE prophylaxis  Allergies  Allergen Reactions  . Betadine [Povidone Iodine] Other (See Comments)    Unknown reaction per caregiver  . Ciprofloxacin Other (See Comments)    Unknown reaction per caregiver  . Fentanyl Other (See Comments)    Previous reaction resulting in intubation, unclear details per family  . Morphine Other (See Comments)    Unknown reaction per caregiver    Patient Measurements: Height: 5\' 5"  (165.1 cm) IBW/kg (Calculated) : 61.5 Heparin Dosing Weight: 73 kg  Vital Signs: Temp: 100 F (37.8 C) (01/19 1649) Temp Source: Rectal (01/19 1649) BP: 110/71 (01/19 1641) Pulse Rate: 95 (01/19 1641)  Labs: Recent Labs    02/16/19 1730  HGB 17.6*  HCT 53.8*  PLT 266  LABPROT 23.4*  INR 2.1*  CREATININE 1.09    CrCl cannot be calculated (Unknown ideal weight.).   Medical History: Past Medical History:  Diagnosis Date  . Cerebral palsy ([place])   . Chronic constipation   . Coagulopathy ([place])   . DVT (deep venous thrombosis) ([place])   . Hypertension   . Insomnia   . Recurrent UTI   . Tachycardia     Assessment: 39 y.o. male with medical history significant for cerebral palsy chronic constipation, coagulopathy, hypertension, insomnia recurrent UTI tachycardia versus atrial fibrillation on anticoagulation. On admission, patient INR was therapeutic at 2.1 on PTA warfarin 3 mg PO daily for history of multiple DVT. Patient Hbg is 17.6 and Hct 53.8, plts are wnl. Will hold warfarin and start heparin with lower Heparin level goal for possible surgery.    Goal of Therapy:  Heparin level 0.3 - 0.5 units/mL Monitor platelets by anticoagulation protocol: Yes   Plan:  Start heparin infusion at 1000 units/hr  Check heparin level in 6 hours and daily while on heparin  Continue to monitor H&H and platelets  Monitor for s/sx of bleeding  Acey Lav, PharmD  PGY1 [place] Resident [phone] 02/16/2019,8:00 PM   Addendum: As INR > 2, will hold heparin for now, start once INR < 2. Follow-up am labs.  Phillis Knack, PharmD, BCPS 02/16/2019 11:15 PM

## 2019-02-16 NOTE — ED Triage Notes (Signed)
Pt from home via ems; Has cerebral palsy - per care giver, pt has hx bowel obstruction - had n/v yesterday, none today; today pt felt he needed an enema, administered by caregiver - pt had normal BM after, but still having abd pain; tender on palpation  144/80 P 100 100% RA RR 18

## 2019-02-16 NOTE — ED Notes (Signed)
Multiple attempts made to contact pt's mother and father, no answer

## 2019-02-16 NOTE — ED Notes (Signed)
Family updated as to patient's status by MD

## 2019-02-16 NOTE — H&P (Signed)
Reason for Consult: SBO Referring Physician: Wyvonnia Dusky, MD  [name] is an 39 y.o. male.   HPI: 10M with h/o CP who lives with an  aide, [name], in an apartment. Per that patient's parents he is verbal at baseline, but the patient was non-communicative with me during exam and history is obtained from the parents and from chart review. He was brought in for abdominal pain present since 1/18 and one episode of emesis. Mom reports that he stated he felt unwell all day and "felt the same symptoms as previous bowel obstructions". He reportedly had a BM yesterday and an enema today with another normal BM. He has a history of multiple bowel obstructions in the past. In 2000, he had a bowel resection by [name](?) at [facility] after presenting with a bowel obstruction. He has has two additional episodes of obstruction since then and the most recent episode underwent ex-lap and LOA without identifiable transition point by [name] in 2019. He wears a condom cath and is wheelchair-bound at baseline. He is on Coumadin for a history of DVT x2-3 per parents.    Past Medical History:  Diagnosis Date   Cerebral palsy ([place])    Chronic constipation    Coagulopathy ([place])    DVT (deep venous thrombosis) ([place])    Hypertension    Insomnia    Recurrent UTI    Tachycardia     Past Surgical History:  Procedure Laterality Date   ELBOW SURGERY  09/12/10   left   INCISE AND DRAIN ABCESS  09/11/10   LAPAROTOMY N/A 12/15/2017   Procedure: EXPLORATORY LAPAROTOMY;  Surgeon: Mickeal Skinner, MD;  Location: [facility];  Service: General;  Laterality: N/A;   SMALL INTESTINE SURGERY     TOOTH EXTRACTION  11/22/2011   Procedure: DENTAL RESTORATION/EXTRACTIONS;  Surgeon: Marcelo Baldy, MD;  Location: [facility];  Service: Dentistry;  Laterality: N/A;  dental restoration and cleaning; NO EXTRACTIONS    Family History  Family history unknown: Yes    Social History:  reports that he has never  smoked. He has never used smokeless tobacco. He reports that he does not drink alcohol or use drugs.  Allergies:  Allergies  Allergen Reactions   Betadine [Povidone Iodine] Other (See Comments)    Unknown reaction per caregiver   Ciprofloxacin Other (See Comments)    Unknown reaction per caregiver   Morphine Other (See Comments)    Unknown reaction per caregiver    Medications: I have reviewed the patient's current medications.  Results for orders placed or performed during the hospital encounter of 02/16/19 (from the past 48 hour(s))  POC SARS Coronavirus 2 Ag-ED -     Status: None   Collection Time: 02/16/19  5:05 PM  Result Value Ref Range   SARS Coronavirus 2 Ag NEGATIVE NEGATIVE    Comment: (NOTE) SARS-CoV-2 antigen NOT DETECTED.  Negative results are presumptive.  Negative results do not preclude SARS-CoV-2 infection and should not be used as the sole basis for treatment or other patient management decisions, including infection  control decisions, particularly in the presence of clinical signs and  symptoms consistent with COVID-19, or in those who have been in contact with the virus.  Negative results must be combined with clinical observations, patient history, and epidemiological information. The expected result is Negative. Fact Sheet for Patients: PodPark.tn Fact Sheet for Healthcare Providers: GiftContent.is This test is not yet approved or cleared by the [place] FDA and  has been  authorized for detection and/or diagnosis of SARS-CoV-2 by FDA under an Emergency Use Authorization (EUA).  This EUA will remain in effect (meaning this test can be used) for the duration of  the COVID-19 de claration under Section 564(b)(1) of the Act, 21 U.S.C. section 360bbb-3(b)(1), unless the authorization is terminated or revoked sooner.   Lactic acid, plasma     Status: Abnormal   Collection Time: 02/16/19  5:22  PM  Result Value Ref Range   Lactic Acid, Venous 3.7 (HH) 0.5 - 1.9 mmol/L    Comment: CRITICAL RESULT CALLED TO, READ BACK BY AND VERIFIED WITH: h [email or web address] [id number] 02/16/2019 [place] Performed at [facility], [place] [address], [place], [place] [address]   CBC with Differential/Platelet     Status: Abnormal   Collection Time: 02/16/19  5:30 PM  Result Value Ref Range   WBC 13.9 (H) 4.0 - 10.5 K/uL   RBC 5.80 4.22 - 5.81 MIL/uL   Hemoglobin 17.6 (H) 13.0 - 17.0 g/dL   HCT 53.8 (H) 39.0 - 52.0 %   MCV 92.8 80.0 - 100.0 fL   MCH 30.3 26.0 - 34.0 pg   MCHC 32.7 30.0 - 36.0 g/dL   RDW 13.4 11.5 - 15.5 %   Platelets 266 150 - 400 K/uL   nRBC 0.0 0.0 - 0.2 %   Neutrophils Relative % 86 %   Neutro Abs 11.8 (H) 1.7 - 7.7 K/uL   Lymphocytes Relative 5 %   Lymphs Abs 0.7 0.7 - 4.0 K/uL   Monocytes Relative 9 %   Monocytes Absolute 1.3 (H) 0.1 - 1.0 K/uL   Eosinophils Relative 0 %   Eosinophils Absolute 0.0 0.0 - 0.5 K/uL   Basophils Relative 0 %   Basophils Absolute 0.0 0.0 - 0.1 K/uL   Immature Granulocytes 0 %   Abs Immature Granulocytes 0.04 0.00 - 0.07 K/uL    Comment: Performed at [facility], [place] [address], [place], [place] [address]  Comprehensive metabolic panel     Status: Abnormal   Collection Time: 02/16/19  5:30 PM  Result Value Ref Range   Sodium 141 135 - 145 mmol/L   Potassium 4.1 3.5 - 5.1 mmol/L   Chloride 97 (L) 98 - 111 mmol/L   CO2 29 22 - 32 mmol/L   Glucose, Bld 146 (H) 70 - 99 mg/dL   BUN 12 6 - 20 mg/dL   Creatinine, Ser 1.09 0.61 - 1.24 mg/dL   Calcium 9.5 8.9 - 10.3 mg/dL   Total Protein 7.3 6.5 - 8.1 g/dL   Albumin 4.2 3.5 - 5.0 g/dL   AST 23 15 - 41 U/L   ALT 16 0 - 44 U/L   Alkaline Phosphatase 63 38 - 126 U/L   Total Bilirubin 1.1 0.3 - 1.2 mg/dL   GFR calc non Af Amer >60 >60 mL/min   GFR calc Af Amer >60 >60 mL/min   Anion gap 15 5 - 15    Comment: Performed at [facility], [place] [address] [place], [place] [address]    Protime-INR     Status: Abnormal   Collection Time: 02/16/19  5:30 PM  Result Value Ref Range   Prothrombin Time 23.4 (H) 11.4 - 15.2 seconds   INR 2.1 (H) 0.8 - 1.2    Comment: (NOTE) INR goal varies based on device and disease states. Performed at [facility], [place] [address], [place], [place] [address]   POC occult blood,  ED Provider will collect     Status: None   Collection Time: 02/16/19  5:33 PM  Result Value Ref Range   Fecal Occult Bld NEGATIVE NEGATIVE    DG Abdomen Acute W/Chest  Result Date: 02/16/2019 CLINICAL DATA:  Abdominal pain and vomiting EXAM: DG ABDOMEN ACUTE W/ 1V CHEST COMPARISON:  CT abdomen and pelvis February 14, 2018; chest radiograph February 14, 2018 FINDINGS: AP chest: Lungs clear. Heart upper normal in size with pulmonary vascularity normal. No adenopathy. Supine and left lateral decubitus abdomen: There are multiple dilated loops of small bowel with multiple air-fluid levels. The appearance is concerning for bowel obstruction. No free air evident. IMPRESSION: Findings felt to be indicative of small bowel obstruction. No free air evident. No lung edema or consolidation. Electronically Signed   By: Lowella Grip III M.D.   On: 02/16/2019 18:04    ROS 10 point review of systems is negative except as listed above in HPI.   Physical Exam Blood pressure 110/71, pulse 95, temperature 100 F (37.8 C), temperature source Rectal, resp. rate 18, SpO2 100 %.  Gen: comfortable, no distress Neuro: nonverbal HEENT: PERRL Neck: supple CV: RRR Pulm: unlabored breathing Abd: soft, NT, distended, evidence of recent emesis Extr: wwp, no edema, extreme varus deformity of R foot    Assessment/Plan: 39M with CP and sx c/w SBO  Admit to inpatient, NGT, SBO protocol. Re-hydrate, trend lactate. Hold coumadin, start LS-hep gtt without reversal.    Jesusita Oka, MD General and [facility]

## 2019-02-17 ENCOUNTER — Inpatient Hospital Stay (HOSPITAL_COMMUNITY): Payer: Medicare Other

## 2019-02-17 LAB — URINALYSIS, ROUTINE W REFLEX MICROSCOPIC
Bilirubin Urine: NEGATIVE
Glucose, UA: NEGATIVE mg/dL
Hgb urine dipstick: NEGATIVE
Ketones, ur: 80 mg/dL — AB
Leukocytes,Ua: NEGATIVE
Nitrite: NEGATIVE
Protein, ur: NEGATIVE mg/dL
Specific Gravity, Urine: 1.026 (ref 1.005–1.030)
pH: 5 (ref 5.0–8.0)

## 2019-02-17 LAB — PROTIME-INR
INR: 2.6 — ABNORMAL HIGH (ref 0.8–1.2)
Prothrombin Time: 28 seconds — ABNORMAL HIGH (ref 11.4–15.2)

## 2019-02-17 LAB — BASIC METABOLIC PANEL
Anion gap: 12 (ref 5–15)
BUN: 8 mg/dL (ref 6–20)
CO2: 27 mmol/L (ref 22–32)
Calcium: 8.8 mg/dL — ABNORMAL LOW (ref 8.9–10.3)
Chloride: 102 mmol/L (ref 98–111)
Creatinine, Ser: 0.89 mg/dL (ref 0.61–1.24)
GFR calc Af Amer: >60 mL/min (ref >60)
GFR calc non Af Amer: >60 mL/min (ref >60)
Glucose, Bld: 135 mg/dL — ABNORMAL HIGH (ref 70–99)
Potassium: 3.5 mmol/L (ref 3.5–5.1)
Sodium: 141 mmol/L (ref 135–145)

## 2019-02-17 LAB — OCCULT BLOOD, POC DEVICE: Fecal Occult Bld: NEGATIVE

## 2019-02-17 LAB — POC SARS CORONAVIRUS 2 AG: SARS Coronavirus 2 Ag: NEGATIVE

## 2019-02-17 LAB — LACTIC ACID, PLASMA: Lactic Acid, Venous: 1.9 mmol/L (ref 0.5–1.9)

## 2019-02-17 LAB — MAGNESIUM: Magnesium: 1.6 mg/dL — ABNORMAL LOW (ref 1.7–2.4)

## 2019-02-17 LAB — HIV ANTIBODY (ROUTINE TESTING W REFLEX): HIV Screen 4th Generation wRfx: NONREACTIVE

## 2019-02-17 MED ORDER — MAGNESIUM SULFATE 2 GM/50ML IV SOLN
2.0000 g | Freq: Once | INTRAVENOUS | Status: AC
  Administered 2019-02-17: 13:00:00 2 g via INTRAVENOUS
  Filled 2019-02-17: qty 50

## 2019-02-17 MED ORDER — METHOCARBAMOL 1000 MG/10ML IJ SOLN
500.0000 mg | Freq: Four times a day (QID) | INTRAVENOUS | Status: DC
  Administered 2019-02-17 – 2019-02-24 (×28): 500 mg via INTRAVENOUS
  Filled 2019-02-17: qty 5
  Filled 2019-02-17 (×3): qty 500
  Filled 2019-02-17: qty 5
  Filled 2019-02-17: qty 500
  Filled 2019-02-17: qty 5
  Filled 2019-02-17 (×2): qty 500
  Filled 2019-02-17 (×4): qty 5
  Filled 2019-02-17: qty 500
  Filled 2019-02-17 (×10): qty 5
  Filled 2019-02-17: qty 500
  Filled 2019-02-17 (×10): qty 5

## 2019-02-17 MED ORDER — METHOCARBAMOL 500 MG PO TABS
500.0000 mg | ORAL_TABLET | Freq: Four times a day (QID) | ORAL | Status: DC
  Administered 2019-02-17: 10:00:00 500 mg via ORAL
  Filled 2019-02-17: qty 1

## 2019-02-17 MED ORDER — IOHEXOL 300 MG/ML  SOLN
100.0000 mL | Freq: Once | INTRAMUSCULAR | Status: AC | PRN
  Administered 2019-02-17: 06:00:00 100 mL via INTRAVENOUS

## 2019-02-17 MED ORDER — METOPROLOL TARTRATE 5 MG/5ML IV SOLN
5.0000 mg | Freq: Four times a day (QID) | INTRAVENOUS | Status: DC
  Administered 2019-02-17 – 2019-02-24 (×29): 5 mg via INTRAVENOUS
  Filled 2019-02-17 (×28): qty 5

## 2019-02-17 MED ORDER — FAMOTIDINE IN NACL 20-0.9 MG/50ML-% IV SOLN
20.0000 mg | Freq: Two times a day (BID) | INTRAVENOUS | Status: DC
  Administered 2019-02-17 – 2019-02-23 (×14): 20 mg via INTRAVENOUS
  Filled 2019-02-17 (×15): qty 50

## 2019-02-17 NOTE — Progress Notes (Addendum)
PT Cancellation Note  Patient Details Name: [name] MRN: [id number] DOB: 07-Nov-1980   Cancelled Treatment:    Reason Eval/Treat Not Completed: PT screened, no needs identified, will sign off. Spoke with pt regarding PLOF and home DME. Pt reporting that he has a power w/c at home that he can maneuver with a head control. He requires a mechanical lift for transfers. Pt reported having 24/7 supervision/assistance at home and is total care for ADLs. Pt is at his baseline in regards to functional mobility; therefore, no acute PT services indicated at this time. Recommend nursing staff use the [facility] lift for transfers and frequent position changes (at least every 2 hours) in bed to maintain skin integrity. Pt declining transfer with lift at this time secondary to abdominal pain. PT discussed benefits of mobility and OOB activity for pt in regards to his current medical issue (SBO). Pt agreeable to OOB later today with use of Maximove lift by nursing staff.   [place] 02/17/2019, 9:07 AM

## 2019-02-17 NOTE — Progress Notes (Addendum)
[facility] for Heparin Indication: DVT history  Allergies  Allergen Reactions  . Betadine [Povidone Iodine] Other (See Comments)    Unknown reaction per caregiver  . Ciprofloxacin Other (See Comments)    Unknown reaction per caregiver  . Fentanyl Other (See Comments)    Previous reaction resulting in intubation, unclear details per family  . Morphine Other (See Comments)    Unknown reaction per caregiver    Patient Measurements: Height: 5\' 5"  (165.1 cm) Weight: 158 lb 11.7 oz (72 kg) IBW/kg (Calculated) : 61.5 Heparin Dosing Weight: 73 kg  Vital Signs: Temp: 99.4 F (37.4 C) (01/20 0523) Temp Source: Oral (01/20 0523) BP: 115/78 (01/20 0523) Pulse Rate: 100 (01/20 0523)  Labs: Recent Labs    02/16/19 1730 02/17/19 0123  HGB 17.6*  --   HCT 53.8*  --   PLT 266  --   LABPROT 23.4* 28.0*  INR 2.1* 2.6*  CREATININE 1.09  --     Estimated Creatinine Clearance: 79.1 mL/min (by C-G formula based on SCr of 1.09 mg/dL).   Medical History: Past Medical History:  Diagnosis Date  . Cerebral palsy ([place])   . Chronic constipation   . Coagulopathy ([place])   . DVT (deep venous thrombosis) ([place])   . Hypertension   . Insomnia   . Recurrent UTI   . 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 On admission, patient INR was therapeutic at 2.1 on PTA warfarin 3 mg PO daily. Will hold warfarin and start heparin with lower heparin level goal for possible surgery.  INR trended up to 2.6 today. Heparin remains on hold until INR<2. CBC wnl. No active bleed issues documented.   Goal of Therapy:  Heparin level 0.3 - 0.5 units/mL Monitor platelets by anticoagulation protocol: Yes   Plan:  Start heparin when INR<2 Monitor daily INR, CBC, s/sx bleeding   Elicia Lamp, PharmD, BCPS Please check AMION for all [facility] contact numbers Clinical  Pharmacist 02/17/2019 8:19 AM

## 2019-02-17 NOTE — Plan of Care (Signed)

## 2019-02-17 NOTE — Progress Notes (Signed)
Received critical lab result--Lactic acid 4.8. Notified on call Dr. Bobbye Morton with new order to give 1L bolus LR. Then follow-up lactic acid blood draw after bolus.

## 2019-02-17 NOTE — Progress Notes (Addendum)
CC: Abdominal pain  Subjective: He cannot really tell me when this started.  He still distended and uncomfortable.  NG is functional but is not draining very well.  I have irrigated it and flushed it and still not draining much.  They are doing 8-hour film right now.  He has a spastic quadriplegia he cannot really control his extremities. I will have them pad the bed.  We are placing on telemetry he is still extremely tachycardic.  Resume metoprolol IV.  Objective: Vital signs in last 24 hours: Temp:  [98.6 F (37 C)-100 F (37.8 C)] 99.4 F (37.4 C) (01/20 0523) Pulse Rate:  [95-118] 100 (01/20 0523) Resp:  [18-25] 18 (01/20 0523) BP: (109-133)/(71-96) 115/78 (01/20 0523) SpO2:  [95 %-100 %] 98 % (01/20 0523) Weight:  [72 kg] 72 kg (01/19 2122) Last BM Date: 02/16/19(PTA) N.p.o.  NG in: 1242 -recorded IV 2050 Urine 360 NG output 1700 T-max 100 on admission Tachycardic HIV is negative Labs last p.m. CMP is essentially normal  lactate 3.7>>4.8>>1.9 Respiratory panel was negative INR 2.6  Urinalysis is negative CT abdomen pelvis with contrast 1/20: Hyperdense fluid in the bladder NG is noted in the stomach which is moderately distended bowel anastomosis noted in the mid right abdomen with numerous loops of dilated bowel with air-fluid levels all bowel loops measuring six-6.5 cm in diameter similar to prior CT there was a transition to decompressed distal small bowel in the mid right abdomen: Was not dilated.  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  Intake/Output from previous day: 01/19 0701 - 01/20 0700 In: 3937.8 [I.V.:645.8; HD:9072020; IV Piggyback:2050] Out: 2060 [Urine:360; Emesis/NG output:1700] Intake/Output this shift: No intake/output data recorded.  General appearance: alert, cooperative, fatigued and Uncomfortable he was down at the bottom of the bed he cannot move himself at all. Resp: clear  to auscultation bilaterally and Anterior exam GI: Belly soft somewhat tender and distended.  Bowel sounds are hyperactive.  He reports no flatus.  Lab Results:  Recent Labs    02/16/19 1730  WBC 13.9*  HGB 17.6*  HCT 53.8*  PLT 266    BMET Recent Labs    02/16/19 1730  NA 141  K 4.1  CL 97*  CO2 29  GLUCOSE 146*  BUN 12  CREATININE 1.09  CALCIUM 9.5   PT/INR Recent Labs    02/16/19 1730 02/17/19 0123  LABPROT 23.4* 28.0*  INR 2.1* 2.6*    Recent Labs  Lab 02/16/19 1730  AST 23  ALT 16  ALKPHOS 63  BILITOT 1.1  PROT 7.3  ALBUMIN 4.2     Lipase     Component Value Date/Time   LIPASE 19 02/16/2019 1730   Prior to Admission medications   Medication Sig Start Date End Date Taking? Authorizing Provider  acetaminophen (TYLENOL) 325 MG tablet Take 650 mg by mouth every 6 (six) hours as needed. For pain    [provider]  baclofen (LIORESAL) 20 MG tablet Take 1 tablet (20 mg total) by mouth 4 (four) times daily. 07/01/18   Suzzanne Cloud, NP  calcium carbonate (TUMS - DOSED IN MG ELEMENTAL CALCIUM) 500 MG chewable tablet Chew 1 tablet by mouth 3 (three) times daily.    [provider]  cetirizine (ZYRTEC) 10 MG tablet Take 10 mg by mouth daily.    [provider]  cholecalciferol (VITAMIN D) 1000 UNITS tablet Take 1,000 Units by mouth 2 (  two) times daily.    [provider]  cyclobenzaprine (FLEXERIL) 10 MG tablet 1 at 8am and 12pm and 2 tabs at 8pm. 07/01/18   Suzzanne Cloud, NP  diazepam (VALIUM) 2 MG tablet Take 1 tablet (2 mg total) by mouth 2 (two) times daily. Must last 30 days. 07/01/18   Suzzanne Cloud, NP  diphenhydrAMINE (BENADRYL) 25 MG tablet Take 25 mg by mouth every 6 (six) hours as needed for allergies.    [provider]  fluticasone (VERAMYST) 27.5 MCG/SPRAY nasal spray Place 1 spray into the nose at bedtime as needed for rhinitis.    [provider]  hydrochlorothiazide (HYDRODIURIL) 25 MG tablet  Take 12.5 mg by mouth daily.     [provider]  loperamide (IMODIUM) 2 MG capsule Take 2 mg by mouth 3 (three) times daily as needed for diarrhea or loose stools. Reported on 05/11/2015    [provider]  metoprolol (TOPROL-XL) 100 MG 24 hr tablet Take 100 mg by mouth daily.     [provider]  mineral oil enema Place 1 enema rectally as needed for severe constipation.    [provider]  Multiple Vitamin (MULITIVITAMIN WITH MINERALS) TABS Take 1 tablet by mouth daily.    [provider]  Polyethylene Glycol 3350 (PEG 3350) POWD Take 17 g by mouth daily as needed for constipation.    [provider]  senna-docusate (SENOKOT-S) 8.6-50 MG tablet Take 3 tablets by mouth at bedtime.    [provider]  tizanidine (ZANAFLEX) 2 MG capsule Take 1 in the morning, take 2 at 12 pm, take 1 in evening 11/25/18   Suzzanne Cloud, NP  warfarin (COUMADIN) 3 MG tablet Take 3 mg by mouth daily at 6 PM.     [provider]      Medications: . docusate sodium  100 mg Oral BID   . sodium chloride    . lactated ringers 125 mL/hr at 02/16/19 2349  . sodium chloride    . 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 Lindon Romp, & 12/15/2017 [name]   FEN: IV fluids/n.p.o. ID: Zosyn x1 dose 02/16/2019 DVT: SCDs - INR 2.6 - pharmacy consult for heparin Follow-up: TBD    Plan: Please vital signs to every 4, place on telemetry, started on IV metoprolol for his tachycardia.  Small bowel protocol in process.  Continue NG for now. Make Robaxin q6h,  add pepcid for GERD.  Add padding to bed rails.  OOB to chair with assist if possible.  I pulled his NG back about 2-3 inches.    LOS: 1 day    Tristan Bramble 02/17/2019 Please see Amion

## 2019-02-18 ENCOUNTER — Inpatient Hospital Stay (HOSPITAL_COMMUNITY): Payer: Medicare Other

## 2019-02-18 LAB — PROTIME-INR
INR: 3.4 — ABNORMAL HIGH (ref 0.8–1.2)
Prothrombin Time: 33.9 seconds — ABNORMAL HIGH (ref 11.4–15.2)

## 2019-02-18 LAB — CBC
HCT: 45.2 % (ref 39.0–52.0)
Hemoglobin: 14.8 g/dL (ref 13.0–17.0)
MCH: 30.1 pg (ref 26.0–34.0)
MCHC: 32.7 g/dL (ref 30.0–36.0)
MCV: 91.9 fL (ref 80.0–100.0)
Platelets: 199 10*3/uL (ref 150–400)
RBC: 4.92 MIL/uL (ref 4.22–5.81)
RDW: 13.8 % (ref 11.5–15.5)
WBC: 7 10*3/uL (ref 4.0–10.5)
nRBC: 0 % (ref 0.0–0.2)

## 2019-02-18 LAB — BASIC METABOLIC PANEL
Anion gap: 12 (ref 5–15)
BUN: 9 mg/dL (ref 6–20)
CO2: 31 mmol/L (ref 22–32)
Calcium: 8.8 mg/dL — ABNORMAL LOW (ref 8.9–10.3)
Chloride: 99 mmol/L (ref 98–111)
Creatinine, Ser: 0.91 mg/dL (ref 0.61–1.24)
GFR calc Af Amer: >60 mL/min (ref >60)
GFR calc non Af Amer: >60 mL/min (ref >60)
Glucose, Bld: 121 mg/dL — ABNORMAL HIGH (ref 70–99)
Potassium: 3.6 mmol/L (ref 3.5–5.1)
Sodium: 142 mmol/L (ref 135–145)

## 2019-02-18 MED ORDER — KCL IN DEXTROSE-NACL 40-5-0.9 MEQ/L-%-% IV SOLN
INTRAVENOUS | Status: DC
  Filled 2019-02-18 (×11): qty 1000

## 2019-02-18 MED ORDER — DIAZEPAM 5 MG/ML IJ SOLN
2.5000 mg | Freq: Three times a day (TID) | INTRAMUSCULAR | Status: DC | PRN
  Administered 2019-02-18 – 2019-02-22 (×6): 2.5 mg via INTRAVENOUS
  Filled 2019-02-18 (×6): qty 2

## 2019-02-18 NOTE — Progress Notes (Signed)
[facility] for Heparin Indication: DVT history  Allergies  Allergen Reactions  . Betadine [Povidone Iodine] Other (See Comments)    Unknown reaction per caregiver  . Ciprofloxacin Other (See Comments)    Unknown reaction per caregiver  . Fentanyl Other (See Comments)    Previous reaction resulting in intubation, unclear details per family  . Morphine Other (See Comments)    Unknown reaction per caregiver    Patient Measurements: Height: 5\' 5"  (165.1 cm) Weight: 158 lb 11.7 oz (72 kg) IBW/kg (Calculated) : 61.5 Heparin Dosing Weight: 73 kg  Vital Signs: Temp: 98.9 F (37.2 C) (01/21 0628) Temp Source: Oral (01/21 0628) BP: 115/75 (01/21 0628) Pulse Rate: 98 (01/21 0628)  Labs: Recent Labs    02/16/19 1730 02/17/19 0123 02/18/19 0112  HGB 17.6*  --  14.8  HCT 53.8*  --  45.2  PLT 266  --  199  LABPROT 23.4* 28.0* 33.9*  INR 2.1* 2.6* 3.4*  CREATININE 1.09 0.89 0.91    Estimated Creatinine Clearance: 94.8 mL/min (by C-G formula based on SCr of 0.91 mg/dL).   Medical History: Past Medical History:  Diagnosis Date  . Cerebral palsy ([place])   . Chronic constipation   . Coagulopathy ([place])   . DVT (deep venous thrombosis) ([place])   . Hypertension   . Insomnia   . Recurrent UTI   . 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 On admission, patient INR was therapeutic at 2.1 on PTA warfarin 3 mg PO daily. Will hold warfarin and start heparin with lower heparin level goal.  INR trended up to 3.4 today. Heparin remains on hold until INR<2. CBC wnl. No active bleed issues documented.   Goal of Therapy:  Heparin level 0.3 - 0.5 units/mL Monitor platelets by anticoagulation protocol: Yes   Plan:  Start heparin when INR<2 Monitor daily INR, CBC, s/sx bleeding   Elicia Lamp, PharmD, BCPS Please check AMION for all [facility] contact  numbers Clinical Pharmacist 02/18/2019 8:16 AM

## 2019-02-18 NOTE — Progress Notes (Addendum)
    CC: Abdominal pain  Subjective:  He looks more comfortable this a.m.  His NG is working but is not draining very much.  A film is pending.  He has orders for ice chips and sips I reviewed that with the nursing staff.  Objective: Vital signs in last 24 hours: Temp:  [98.9 F (37.2 C)-99.4 F (37.4 C)] 98.9 F (37.2 C) (01/21 0628) Pulse Rate:  [90-109] 98 (01/21 0628) Resp:  [16-18] 18 (01/21 0628) BP: (115-125)/(67-82) 115/75 (01/21 0628) SpO2:  [95 %-98 %] 95 % (01/21 0628) Last BM Date: 02/16/19 3321 IV 675 urine recorded 300 NG No BM recorded Afebrile; Tm 99.4 BP OK  HR still up some - SR K+3.6 WBC 7.0 Film pending Intake/Output from previous day: 01/20 0701 - 01/21 0700 In: 3321.7 [I.V.:3021.7; IV Piggyback:300.1] Out: 975 [Urine:675; Emesis/NG output:300] Intake/Output this shift: No intake/output data recorded.  General appearance: alert, cooperative and He looks more comfortable but I cannot understand some of the things he is trying to tell me. Resp: clear to auscultation bilaterally GI: Abdomen is a little less distended.  Still some high-pitched bowel sounds.   I do not think he has had any flatus, no BM so far.  Lab Results:  Recent Labs    02/16/19 1730 02/18/19 0112  WBC 13.9* 7.0  HGB 17.6* 14.8  HCT 53.8* 45.2  PLT 266 199    BMET Recent Labs    02/17/19 0123 02/18/19 0112  NA 141 142  K 3.5 3.6  CL 102 99  CO2 27 31  GLUCOSE 135* 121*  BUN 8 9  CREATININE 0.89 0.91  CALCIUM 8.8* 8.8*   PT/INR Recent Labs    02/17/19 0123 02/18/19 0112  LABPROT 28.0* 33.9*  INR 2.6* 3.4*    Recent Labs  Lab 02/16/19 1730  AST 23  ALT 16  ALKPHOS 63  BILITOT 1.1  PROT 7.3  ALBUMIN 4.2     Lipase     Component Value Date/Time   LIPASE 19 02/16/2019 1730     Medications: . docusate sodium  100 mg Oral BID  . 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 Lindon Romp, & 12/15/2017 [name]   FEN: IV fluids/n.p.o. ID: Zosyn x1 dose 02/16/2019 DVT: SCDs - INR 2.1>>2.6>>3.6    - pharmacy consult for heparin Follow-up: TBD  Plan: A film is pending.  I am going to add some potassium to his IV fluids.  Ice chips and sips.  I have asked the staff to try and get him up in the chair some today if possible.  We will continue NG for now.  A.m. film continues shows small bowel obstruction without significant change.  The proximal stomach and small bowel loops remain significantly distended.    LOS: 2 days    [name] 02/18/2019 Please see Amion

## 2019-02-19 LAB — PROTIME-INR
INR: 3.6 — ABNORMAL HIGH (ref 0.8–1.2)
Prothrombin Time: 35.6 seconds — ABNORMAL HIGH (ref 11.4–15.2)

## 2019-02-19 LAB — BASIC METABOLIC PANEL
Anion gap: 11 (ref 5–15)
BUN: 9 mg/dL (ref 6–20)
CO2: 23 mmol/L (ref 22–32)
Calcium: 8.2 mg/dL — ABNORMAL LOW (ref 8.9–10.3)
Chloride: 110 mmol/L (ref 98–111)
Creatinine, Ser: 0.73 mg/dL (ref 0.61–1.24)
GFR calc Af Amer: >60 mL/min (ref >60)
GFR calc non Af Amer: >60 mL/min (ref >60)
Glucose, Bld: 131 mg/dL — ABNORMAL HIGH (ref 70–99)
Potassium: 4.3 mmol/L (ref 3.5–5.1)
Sodium: 144 mmol/L (ref 135–145)

## 2019-02-19 MED ORDER — PHYTONADIONE 5 MG PO TABS
2.5000 mg | ORAL_TABLET | Freq: Once | ORAL | Status: AC
  Administered 2019-02-19: 2.5 mg
  Filled 2019-02-19: qty 1

## 2019-02-19 MED ORDER — PHYTONADIONE 5 MG PO TABS: 2.5000 mg | ORAL_TABLET | Freq: Once | ORAL | Status: DC

## 2019-02-19 NOTE — Progress Notes (Signed)
CC: Abdominal pain  Subjective: He still distended but his stomach feels a little bit softer.  The NG is working.  He says he is having flatus but I cannot tell.  Most of his speech is very difficult to understand.  His labs and film are all pending.  Objective: Vital signs in last 24 hours: Temp:  [98.3 F (36.8 C)-99.8 F (37.7 C)] 99.7 F (37.6 C) (01/22 0436) Pulse Rate:  [99-110] 109 (01/22 0436) Resp:  [16] 16 (01/22 0436) BP: (116-131)/(56-80) 120/80 (01/22 0436) SpO2:  [94 %-98 %] 94 % (01/22 0436) Last BM Date: 02/16/19 60 p.o. recorded 400 IV recorded 650 urine recorded 800 NG recorded second shift Afebrile vital signs are stable No labs this a.m. A.m. film is pending. Intake/Output from previous day: 01/21 0701 - 01/22 0700 In: 457.7 [P.O.:60; I.V.:247.7; IV Piggyback:150] Out: 1450 [Urine:650; Emesis/NG output:800] Intake/Output this shift: No intake/output data recorded.  General appearance: alert, cooperative and no distress Resp: clear to auscultation bilaterally GI: Still distended, but softer.  Bowel sounds are high-pitched.  I cannot tell whether he is having flatus or not.  Lab Results:  Recent Labs    02/16/19 1730 02/18/19 0112  WBC 13.9* 7.0  HGB 17.6* 14.8  HCT 53.8* 45.2  PLT 266 199    BMET Recent Labs    02/17/19 0123 02/18/19 0112  NA 141 142  K 3.5 3.6  CL 102 99  CO2 27 31  GLUCOSE 135* 121*  BUN 8 9  CREATININE 0.89 0.91  CALCIUM 8.8* 8.8*   PT/INR Recent Labs    02/17/19 0123 02/18/19 0112  LABPROT 28.0* 33.9*  INR 2.6* 3.4*    Recent Labs  Lab 02/16/19 1730  AST 23  ALT 16  ALKPHOS 63  BILITOT 1.1  PROT 7.3  ALBUMIN 4.2     Lipase     Component Value Date/Time   LIPASE 19 02/16/2019 1730     Medications: . metoprolol tartrate  5 mg Intravenous Q6H   . dextrose 5 % and 0.9 % NaCl with KCl 40 mEq/L 100 mL/hr at 02/19/19 0422  . famotidine (PEPCID) IV 20 mg (02/18/19 2145)  . lactated ringers  10 mL/hr at 02/18/19 0937  . methocarbamol (ROBAXIN) IV 500 mg (02/19/19 0308)  . sodium chloride    . sodium chloride     . dextrose 5 % and 0.9 % NaCl with KCl 40 mEq/L 100 mL/hr at 02/19/19 0422  . famotidine (PEPCID) IV 20 mg (02/18/19 2145)  . lactated ringers 10 mL/hr at 02/18/19 0937  . methocarbamol (ROBAXIN) IV 500 mg (02/19/19 0308)  . sodium chloride    . 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,&12/15/2017 [name]   FEN: IV fluids@ 172ml/hr - sips and chips ID: Zosyn x1 dose 02/16/2019 DVT: SCDs- INR 2.1>>2.6>>3.6>>pending 1/22   - pharmacy consult for heparin Follow-up: TBD  Plan: Continue NG decompression.  This is day 3 of his hospitalization, films and labs are both pending currently.  I will give him some clears from the floor, PT is recommending Maximove Lift to move him so he has not been OOB.  I  will get him an air mattress also.      LOS: 3 days    Kristilyn Coltrane 02/19/2019 Please see Amion

## 2019-02-19 NOTE — Progress Notes (Signed)
[name], pt's caregiver, at bedside.

## 2019-02-19 NOTE — Progress Notes (Signed)
Pecola Leisure, pt's mother, called for pt update. All questions answered to satisfaction. Dorothy provided information regarding pt's one on one caregiver to be present during pt's hospitalization, [name] [phone]. Will continue to monitor.

## 2019-02-19 NOTE — Progress Notes (Signed)
[facility] for Heparin Indication: DVT history  Allergies  Allergen Reactions  . Betadine [Povidone Iodine] Other (See Comments)    Unknown reaction per caregiver  . Ciprofloxacin Other (See Comments)    Unknown reaction per caregiver  . Fentanyl Other (See Comments)    Previous reaction resulting in intubation, unclear details per family  . Morphine Other (See Comments)    Unknown reaction per caregiver    Patient Measurements: Height: 5\' 5"  (165.1 cm) Weight: 158 lb 11.7 oz (72 kg) IBW/kg (Calculated) : 61.5 Heparin Dosing Weight: 73 kg  Vital Signs: Temp: 99.7 F (37.6 C) (01/22 0436) Temp Source: Oral (01/22 0436) BP: 120/80 (01/22 0436) Pulse Rate: 109 (01/22 0436)  Labs: Recent Labs    02/16/19 1730 02/16/19 1730 02/17/19 0123 02/18/19 0112 02/19/19 0823  HGB 17.6*  --   --  14.8  --   HCT 53.8*  --   --  45.2  --   PLT 266  --   --  199  --   LABPROT 23.4*   < > 28.0* 33.9* 35.6*  INR 2.1*   < > 2.6* 3.4* 3.6*  CREATININE 1.09   < > 0.89 0.91 0.73   < > = values in this interval not displayed.    Estimated Creatinine Clearance: 107.8 mL/min (by C-G formula based on SCr of 0.73 mg/dL).   Medical History: Past Medical History:  Diagnosis Date  . Cerebral palsy ([place])   . Chronic constipation   . Coagulopathy ([place])   . DVT (deep venous thrombosis) ([place])   . Hypertension   . Insomnia   . Recurrent UTI   . 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 On admission, patient INR was therapeutic at 2.1 on PTA warfarin 3 mg PO daily. Will hold warfarin and start heparin with lower heparin level goal.  INR trended up to 3.6 today. Heparin remains on hold until INR<2. CBC wnl. No active bleed issues documented.   Goal of Therapy:  Heparin level 0.3 - 0.5 units/mL Monitor platelets by anticoagulation protocol: Yes   Plan:   Start heparin when INR<2 Monitor daily INR, CBC, s/sx bleeding   Elicia Lamp, PharmD, BCPS Please check AMION for all [facility] contact numbers Clinical Pharmacist 02/19/2019 9:39 AM

## 2019-02-19 NOTE — Plan of Care (Signed)
  Problem: Education: ?Goal: Knowledge of General Education information will improve ?Description: Including pain rating scale, medication(s)/side effects and non-pharmacologic comfort measures ?Outcome: Progressing ?  ?Problem: Health Behavior/Discharge Planning: ?Goal: Ability to manage health-related needs will improve ?Outcome: Progressing ?  ?Problem: Clinical Measurements: ?Goal: Respiratory complications will improve ?Outcome: Progressing ?Goal: Cardiovascular complication will be avoided ?Outcome: Progressing ?  ?Problem: Elimination: ?Goal: Will not experience complications related to urinary retention ?Outcome: Progressing ?  ?Problem: Pain Managment: ?Goal: General experience of comfort will improve ?Outcome: Progressing ?  ?Problem: Safety: ?Goal: Ability to remain free from injury will improve ?Outcome: Progressing ?  ?Problem: Skin Integrity: ?Goal: Risk for impaired skin integrity will decrease ?Outcome: Progressing ?  ?
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
## 2019-02-20 ENCOUNTER — Inpatient Hospital Stay (HOSPITAL_COMMUNITY): Payer: Medicare Other

## 2019-02-20 LAB — PROTIME-INR
INR: 3.1 — ABNORMAL HIGH (ref 0.8–1.2)
Prothrombin Time: 32.1 seconds — ABNORMAL HIGH (ref 11.4–15.2)

## 2019-02-20 MED ORDER — LORAZEPAM 2 MG/ML IJ SOLN
0.5000 mg | Freq: Once | INTRAMUSCULAR | Status: AC
  Administered 2019-02-20: 01:00:00 0.5 mg via INTRAVENOUS
  Filled 2019-02-20: qty 1

## 2019-02-20 MED ORDER — FLEET ENEMA 7-19 GM/118ML RE ENEM
1.0000 | ENEMA | Freq: Every day | RECTAL | Status: DC | PRN
  Administered 2019-02-20: 11:00:00 1 via RECTAL
  Filled 2019-02-20: qty 1

## 2019-02-20 NOTE — Progress Notes (Signed)
    CC: Abdominal pain  Subjective: Still large volume of NG drainage.  Abdomen is soft is not as distended as before.  He does not look uncomfortable.  He says he got an enema yesterday, nursing staff is unaware.  He also indicates he uses enemas at home.  He understood fleets enema when I use that term.  Objective: Vital signs in last 24 hours: Temp:  [98 F (36.7 C)-99.5 F (37.5 C)] 99.5 F (37.5 C) (01/23 0539) Pulse Rate:  [90-109] 90 (01/23 0539) Resp:  [18-20] 19 (01/23 0539) BP: (109-125)/(63-78) 109/63 (01/23 0539) SpO2:  [94 %-100 %] 96 % (01/23 0539) Last BM Date: 02/16/19 180 p.o. 2700 IV 350 urine recorded 2800 NG recorded No BM recorded Afebrile vital signs are stable T-max 99.7. No labs this a.m. A.m. film at 0 854 shows NG is in the stomach small bowel dilatation unchanged. Intake/Output from previous day: 01/22 0701 - 01/23 0700 In: 4944.2 [P.O.:180; I.V.:4314.2; IV Piggyback:450] Out: 3150 [Urine:350; Emesis/NG output:2800] Intake/Output this shift: No intake/output data recorded.  General appearance: alert, cooperative and no distress Resp: clear to auscultation bilaterally GI: Soft, still distended but improving each day.  No BM recorded.  Still large amount of NG drainage.  Lab Results:  Recent Labs    02/18/19 0112  WBC 7.0  HGB 14.8  HCT 45.2  PLT 199    BMET Recent Labs    02/18/19 0112 02/19/19 0823  NA 142 144  K 3.6 4.3  CL 99 110  CO2 31 23  GLUCOSE 121* 131*  BUN 9 9  CREATININE 0.91 0.73  CALCIUM 8.8* 8.2*   PT/INR Recent Labs    02/19/19 0823 02/20/19 0227  LABPROT 35.6* 32.1*  INR 3.6* 3.1*    Recent Labs  Lab 02/16/19 1730  AST 23  ALT 16  ALKPHOS 63  BILITOT 1.1  PROT 7.3  ALBUMIN 4.2     Lipase     Component Value Date/Time   LIPASE 19 02/16/2019 1730     . dextrose 5 % and 0.9 % NaCl with KCl 40 mEq/L 100 mL/hr at 02/20/19 0055  . famotidine (PEPCID) IV Stopped (02/19/19 2326)  . lactated  ringers 10 mL/hr at 02/18/19 0937  . methocarbamol (ROBAXIN) IV 500 mg (02/20/19 0818)  . sodium chloride    . sodium chloride     Medications: . 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,&12/15/2017 [name]   FEN: IV fluids@ 175ml/hr - sips and chips ID: Zosyn x1 dose 02/16/2019 DVT: SCDs- INR2.1>>2.6>>3.6>>3.1 - pharmacy consult for heparin Follow-up: TBD  Plan: Repeat fleets enema today, continue NG decompression and fluid rehydration.  Recheck labs in a.m.       LOS: 4 days    [name] 02/20/2019 Please see Amion

## 2019-02-20 NOTE — Progress Notes (Signed)
Pt received a fleet enema., and tolerated it well.  Soon after pt had a small bowel movement.

## 2019-02-20 NOTE — Progress Notes (Signed)
Patient agitated and accidentally removed NGT, this RN informed MD and was given verbal order to reinsert NGT because patient is still distended and moderate amount of bile fluid was collected within shift. Reinserted NGT on L nare and had abdominal xray done, tube terminates over the gastric body. NGT connected to low intermittent suction.  Patient tolerated procedure well and will continue to monitor within remainder of shift.

## 2019-02-20 NOTE — Progress Notes (Signed)
[place] for Heparin Indication: DVT history  Allergies  Allergen Reactions  . Betadine [Povidone Iodine] Other (See Comments)    Unknown reaction per caregiver  . Ciprofloxacin Other (See Comments)    Unknown reaction per caregiver  . Fentanyl Other (See Comments)    Previous reaction resulting in intubation, unclear details per family  . Morphine Other (See Comments)    Unknown reaction per caregiver    Patient Measurements: Height: 5\' 5"  (165.1 cm) Weight: 158 lb 11.7 oz (72 kg) IBW/kg (Calculated) : 61.5 Heparin Dosing Weight: 73 kg  Vital Signs: Temp: 99.5 F (37.5 C) (01/23 0539) Temp Source: Oral (01/23 0539) BP: 109/63 (01/23 0539) Pulse Rate: 90 (01/23 0539)  Labs: Recent Labs    02/18/19 0112 02/19/19 0823 02/20/19 0227  HGB 14.8  --   --   HCT 45.2  --   --   PLT 199  --   --   LABPROT 33.9* 35.6* 32.1*  INR 3.4* 3.6* 3.1*  CREATININE 0.91 0.73  --     Estimated Creatinine Clearance: 107.8 mL/min (by C-G formula based on SCr of 0.73 mg/dL).   Medical History: Past Medical History:  Diagnosis Date  . Cerebral palsy ([place])   . Chronic constipation   . Coagulopathy ([place])   . DVT (deep venous thrombosis) ([place])   . Hypertension   . Insomnia   . Recurrent UTI   . 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 On admission, patient INR was therapeutic at 2.1 on PTA warfarin 3 mg PO daily. Will hold warfarin and start heparin with lower heparin level goal.  INR is down to 3.1 today s/p vitamin K 2.5 mg PO. Heparin remains on hold until INR<2. CBC wnl. No active bleed issues documented.   Goal of Therapy:  Heparin level 0.3 - 0.5 units/mL Monitor platelets by anticoagulation protocol: Yes   Plan:  Start heparin when INR<2 Monitor daily INR, CBC, s/sx bleeding  Berenice Bouton, PharmD PGY1 Pharmacy  Resident  Please check AMION for all [facility] phone numbers After 10:00 PM, call [place] [phone]  02/20/2019 7:36 AM

## 2019-02-21 ENCOUNTER — Inpatient Hospital Stay (HOSPITAL_COMMUNITY): Payer: Medicare Other

## 2019-02-21 LAB — COMPREHENSIVE METABOLIC PANEL
ALT: 11 U/L (ref 0–44)
AST: 15 U/L (ref 15–41)
Albumin: 2.7 g/dL — ABNORMAL LOW (ref 3.5–5.0)
Alkaline Phosphatase: 43 U/L (ref 38–126)
Anion gap: 9 (ref 5–15)
BUN: 6 mg/dL (ref 6–20)
CO2: 22 mmol/L (ref 22–32)
Calcium: 8 mg/dL — ABNORMAL LOW (ref 8.9–10.3)
Chloride: 112 mmol/L — ABNORMAL HIGH (ref 98–111)
Creatinine, Ser: 0.62 mg/dL (ref 0.61–1.24)
GFR calc Af Amer: >60 mL/min (ref >60)
GFR calc non Af Amer: >60 mL/min (ref >60)
Glucose, Bld: 111 mg/dL — ABNORMAL HIGH (ref 70–99)
Potassium: 4.6 mmol/L (ref 3.5–5.1)
Sodium: 143 mmol/L (ref 135–145)
Total Bilirubin: 0.9 mg/dL (ref 0.3–1.2)
Total Protein: 5.4 g/dL — ABNORMAL LOW (ref 6.5–8.1)

## 2019-02-21 LAB — CULTURE, BLOOD (ROUTINE X 2): Culture: NO GROWTH

## 2019-02-21 LAB — CBC
HCT: 44.9 % (ref 39.0–52.0)
Hemoglobin: 13.5 g/dL (ref 13.0–17.0)
MCH: 30.2 pg (ref 26.0–34.0)
MCHC: 30.1 g/dL (ref 30.0–36.0)
MCV: 100.4 fL — ABNORMAL HIGH (ref 80.0–100.0)
Platelets: 199 10*3/uL (ref 150–400)
RBC: 4.47 MIL/uL (ref 4.22–5.81)
RDW: 13.8 % (ref 11.5–15.5)
WBC: 7.5 10*3/uL (ref 4.0–10.5)
nRBC: 0 % (ref 0.0–0.2)

## 2019-02-21 LAB — PROTIME-INR
INR: 2.8 — ABNORMAL HIGH (ref 0.8–1.2)
Prothrombin Time: 29.7 seconds — ABNORMAL HIGH (ref 11.4–15.2)

## 2019-02-21 LAB — PREALBUMIN: Prealbumin: 8.7 mg/dL — ABNORMAL LOW (ref 18–38)

## 2019-02-21 MED ORDER — FLEET ENEMA 7-19 GM/118ML RE ENEM
1.0000 | ENEMA | Freq: Once | RECTAL | Status: AC
  Administered 2019-02-21: 11:00:00 1 via RECTAL
  Filled 2019-02-21: qty 1

## 2019-02-21 MED ORDER — KCL IN DEXTROSE-NACL 20-5-0.9 MEQ/L-%-% IV SOLN
INTRAVENOUS | Status: DC
  Filled 2019-02-21 (×9): qty 1000

## 2019-02-21 MED ORDER — BOOST / RESOURCE BREEZE PO LIQD CUSTOM
1.0000 | Freq: Three times a day (TID) | ORAL | Status: DC
  Administered 2019-02-21 – 2019-02-25 (×12): 1 via ORAL

## 2019-02-21 NOTE — Progress Notes (Addendum)
    CC: Abdominal pain  Subjective: He had a BM with the enema.  He pulled his NG out again this morning it was replaced they have only got about 50 from it since then.  He says he is hungry.  He still a bit distended but he has bowel sounds.  Objective: Vital signs in last 24 hours: Temp:  [97.8 F (36.6 C)-98.5 F (36.9 C)] 98.5 F (36.9 C) (01/24 0511) Pulse Rate:  [94-105] 96 (01/24 0511) Resp:  [18-19] 19 (01/24 0511) BP: (117-128)/(72-80) 126/77 (01/24 0511) SpO2:  [96 %-100 %] 98 % (01/24 0511) Last BM Date: 02/16/19 N.p.o. 750 IV recorded NG 1150 Only 100 recorded in the last 8 hours. BM x1 Afebrile vital signs are stable. Labs are stable. Prealbumin is 8.7 INR 2.8 WBC 7.5 Film this a.m. shows improvement small bowel dilatation the NG in the stomach.  Intake/Output from previous day: 01/23 0701 - 01/24 0700 In: 750 [I.V.:600; IV Piggyback:150] Out: 1150 [Emesis/NG output:1150] Intake/Output this shift: No intake/output data recorded.  General appearance: alert, cooperative and no distress Resp: clear to auscultation bilaterally GI: Soft still distended some bowel sounds present.  He did have a BM with a enema yesterday.  Lab Results:  Recent Labs    02/21/19 0410  WBC 7.5  HGB 13.5  HCT 44.9  PLT 199    BMET Recent Labs    02/19/19 0823 02/21/19 0810  NA 144 143  K 4.3 4.6  CL 110 112*  CO2 23 22  GLUCOSE 131* 111*  BUN 9 6  CREATININE 0.73 0.62  CALCIUM 8.2* 8.0*   PT/INR Recent Labs    02/20/19 0227 02/21/19 0410  LABPROT 32.1* 29.7*  INR 3.1* 2.8*    Recent Labs  Lab 02/16/19 1730 02/21/19 0810  AST 23 15  ALT 16 11  ALKPHOS 63 43  BILITOT 1.1 0.9  PROT 7.3 5.4*  ALBUMIN 4.2 2.7*     Lipase     Component Value Date/Time   LIPASE 19 02/16/2019 1730     Medications: . 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,&12/15/2017 [name]   FEN: IV fluids@ 133ml/hr - sips and chips ID: Zosyn x1 dose 02/16/2019 DVT: SCDs- INR2.1>>2.6>>3.6>>3.1 - pharmacy consult for heparin Follow-up: TBD  Plan: He is showing some slow improvement, NG drainage is down.  Try some clamping trials, repeat the fleets enema, and try him on some sips of clears from the floor.  I spoke with his father Sruly Bressi.      LOS: 5 days    Nevah Dalal 02/21/2019 Please see Amion

## 2019-02-21 NOTE — Progress Notes (Signed)
[place] for Heparin Indication: DVT history  Allergies  Allergen Reactions  . Betadine [Povidone Iodine] Other (See Comments)    Unknown reaction per caregiver  . Ciprofloxacin Other (See Comments)    Unknown reaction per caregiver  . Fentanyl Other (See Comments)    Previous reaction resulting in intubation, unclear details per family  . Morphine Other (See Comments)    Unknown reaction per caregiver    Patient Measurements: Height: 5\' 5"  (165.1 cm) Weight: 158 lb 11.7 oz (72 kg) IBW/kg (Calculated) : 61.5 Heparin Dosing Weight: 73 kg  Vital Signs: Temp: 98.5 F (36.9 C) (01/24 0511) Temp Source: Oral (01/24 0511) BP: 126/77 (01/24 0511) Pulse Rate: 96 (01/24 0511)  Labs: Recent Labs    02/19/19 0823 02/20/19 0227 02/21/19 0410  HGB  --   --  13.5  HCT  --   --  44.9  PLT  --   --  199  LABPROT 35.6* 32.1* 29.7*  INR 3.6* 3.1* 2.8*  CREATININE 0.73  --   --     Estimated Creatinine Clearance: 107.8 mL/min (by C-G formula based on SCr of 0.73 mg/dL).   Medical History: Past Medical History:  Diagnosis Date  . Cerebral palsy ([place])   . Chronic constipation   . Coagulopathy ([place])   . DVT (deep venous thrombosis) ([place])   . Hypertension   . Insomnia   . Recurrent UTI   . 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 On admission, patient INR was therapeutic at 2.1 on PTA warfarin 3 mg PO daily. Will hold warfarin and start heparin with lower heparin level goal.  INR is down to 2.8 today s/p vitamin K 2.5 mg PO on 1/22. Heparin remains on hold until INR<2. CBC wnl. No active bleed issues documented.   Goal of Therapy:  Heparin level 0.3 - 0.5 units/mL Monitor platelets by anticoagulation protocol: Yes   Plan:  Start heparin when INR<2 Monitor daily INR, CBC, s/sx bleeding  Berenice Bouton, PharmD PGY1 Pharmacy  Resident  Please check AMION for all [place] phone numbers After 10:00 PM, call [place] [phone]  02/21/2019 7:50 AM

## 2019-02-21 NOTE — Progress Notes (Signed)
Around 0600, patient pulled out NG tube.  Replaced with new NG tube in left nare.  Waiting for Abd x-ray to confirm placement before reconnecting to suction.

## 2019-02-21 NOTE — Progress Notes (Signed)
NG tube clamped at 10:00.  Resumed to suction at 1400.  150 mLs out put at the two hour mark.  Pt tolerated it well, denies, nausea. He is still distended but no changes from thi am.  Pt was clamped again at 1600.  Will continue to monitor.

## 2019-02-22 LAB — CBC
HCT: 41.4 % (ref 39.0–52.0)
Hemoglobin: 13.4 g/dL (ref 13.0–17.0)
MCH: 29.7 pg (ref 26.0–34.0)
MCHC: 32.4 g/dL (ref 30.0–36.0)
MCV: 91.8 fL (ref 80.0–100.0)
Platelets: 231 10*3/uL (ref 150–400)
RBC: 4.51 MIL/uL (ref 4.22–5.81)
RDW: 13.5 % (ref 11.5–15.5)
WBC: 7.2 10*3/uL (ref 4.0–10.5)
nRBC: 0 % (ref 0.0–0.2)

## 2019-02-22 LAB — CULTURE, BLOOD (ROUTINE X 2)
Culture: NO GROWTH
Special Requests: ADEQUATE

## 2019-02-22 LAB — BASIC METABOLIC PANEL
Anion gap: 12 (ref 5–15)
BUN: <5 mg/dL — ABNORMAL LOW (ref 6–20)
CO2: 20 mmol/L — ABNORMAL LOW (ref 22–32)
Calcium: 7.9 mg/dL — ABNORMAL LOW (ref 8.9–10.3)
Chloride: 111 mmol/L (ref 98–111)
Creatinine, Ser: 0.68 mg/dL (ref 0.61–1.24)
GFR calc Af Amer: >60 mL/min (ref >60)
GFR calc non Af Amer: >60 mL/min (ref >60)
Glucose, Bld: 104 mg/dL — ABNORMAL HIGH (ref 70–99)
Potassium: 4.2 mmol/L (ref 3.5–5.1)
Sodium: 143 mmol/L (ref 135–145)

## 2019-02-22 LAB — PROTIME-INR
INR: 2.9 — ABNORMAL HIGH (ref 0.8–1.2)
Prothrombin Time: 30.1 seconds — ABNORMAL HIGH (ref 11.4–15.2)

## 2019-02-22 MED ORDER — DIAZEPAM 5 MG/ML IJ SOLN
2.5000 mg | Freq: Three times a day (TID) | INTRAMUSCULAR | Status: DC | PRN
  Administered 2019-02-23 – 2019-02-24 (×2): 2.5 mg via INTRAVENOUS
  Filled 2019-02-22 (×2): qty 2

## 2019-02-22 MED ORDER — DIAZEPAM 2 MG PO TABS
2.0000 mg | ORAL_TABLET | Freq: Two times a day (BID) | ORAL | Status: DC
  Administered 2019-02-22 – 2019-02-25 (×7): 2 mg via ORAL
  Filled 2019-02-22 (×7): qty 1

## 2019-02-22 NOTE — Progress Notes (Signed)
ANTICOAGULATION CONSULT NOTE - Follow Up Consult  Pharmacy Consult for Heparin when INR <2 Indication: hx DVT  Allergies  Allergen Reactions  . Betadine [Povidone Iodine] Other (See Comments)    Unknown reaction per caregiver  . Ciprofloxacin Other (See Comments)    Unknown reaction per caregiver  . Fentanyl Other (See Comments)    Previous reaction resulting in intubation, unclear details per family  . Morphine Other (See Comments)    Unknown reaction per caregiver    Patient Measurements: Height: 5\' 5"  (165.1 cm) Weight: 158 lb 11.7 oz (72 kg) IBW/kg (Calculated) : 61.5 Heparin Dosing Weight: 72 kg  Vital Signs: Temp: 100.1 F (37.8 C) (01/25 0505) Temp Source: Oral (01/25 0505) BP: 133/66 (01/25 0505) Pulse Rate: 97 (01/25 0505)  Labs: Recent Labs    02/20/19 0227 02/21/19 0410 02/21/19 0810 02/22/19 0836 02/22/19 1130 02/22/19 1141  HGB  --  13.5  --   --   --  13.4  HCT  --  44.9  --   --   --  41.4  PLT  --  199  --   --   --  231  LABPROT 32.1* 29.7*  --   --  30.1*  --   INR 3.1* 2.8*  --   --  2.9*  --   CREATININE  --   --  0.62 0.68  --   --     Estimated Creatinine Clearance: 107.8 mL/min (by C-G formula based on SCr of 0.68 mg/dL).  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 On admission, INR was therapeutic at 2.1 on admit 1/19 on PTA warfarin 3 mg PO daily. Warfarin held for possible need for surgery. Plan IV heparin with lower heparin level goal.    s/p vitamin K 2.5 mg PO on 1/22 when INR trended up to 3.6.  INR remains therapeutic today, 2.9.  Last Warfarin dose 1/18.   Clamping NG tube today and starting clear liquids.  Goal of Therapy:  Heparin level 0.3-0.5 units/ml Monitor platelets by anticoagulation protocol: Yes   Plan:   No heparin yet, as INR remains >2.  Daily PT/INR.  Warfarin on hold.  Will follow up for starting heparin or resuming warfarin  when able.  Arty Baumgartner, [place] Phone: [phone] 02/22/2019,2:28 PM

## 2019-02-22 NOTE — Progress Notes (Addendum)
    CC: SBO  Subjective: Patient is having some spasms when I walked NG tube loose.  Staff unaware of bowel movement yesterday even the ones recorded.  There are no other is associated with the enema or not.  I reattached his suction and got less than 100 cc out of his NG.  His abdomen is softer and less distended today.  Objective: Vital signs in last 24 hours: Temp:  [98.7 F (37.1 C)-100.1 F (37.8 C)] 100.1 F (37.8 C) (01/25 0505) Pulse Rate:  [83-97] 97 (01/25 0505) Resp:  [16-18] 18 (01/25 0505) BP: (128-142)/(66-85) 133/66 (01/25 0505) SpO2:  [100 %] 100 % (01/24 1402) Last BM Date: 02/16/19 Nothing p.o. recorded 300 IV recorded Urine 1800 725 from the NG BM x1 T-max 100.1, vital signs are stable. INR pending  Intake/Output from previous day: 01/24 0701 - 01/25 0700 In: 300 [IV Piggyback:300] Out: 2525 [Urine:1800; Emesis/NG output:725] Intake/Output this shift: No intake/output data recorded.  General appearance: alert, cooperative and no distress Resp: clear to auscultation bilaterally and Some upper airway wheezing. GI: Soft he is not really distended this morning.  Bowel sounds there is a BM recorded but the nursing staff is unaware.  Lab Results:  Recent Labs    02/21/19 0410  WBC 7.5  HGB 13.5  HCT 44.9  PLT 199    BMET Recent Labs    02/19/19 0823 02/21/19 0810  NA 144 143  K 4.3 4.6  CL 110 112*  CO2 23 22  GLUCOSE 131* 111*  BUN 9 6  CREATININE 0.73 0.62  CALCIUM 8.2* 8.0*   PT/INR Recent Labs    02/20/19 0227 02/21/19 0410  LABPROT 32.1* 29.7*  INR 3.1* 2.8*    Recent Labs  Lab 02/16/19 1730 02/21/19 0810  AST 23 15  ALT 16 11  ALKPHOS 63 43  BILITOT 1.1 0.9  PROT 7.3 5.4*  ALBUMIN 4.2 2.7*     Lipase     Component Value Date/Time   LIPASE 19 02/16/2019 1730     Medications: . feeding supplement  1 Container Oral TID BM  . 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,&12/15/2017 [name]   FEN: IV fluids@ 13ml/hr - sips and chips ID: Zosyn x1 dose 02/16/2019 DVT: SCDs- INR2.1>>2.6>>3.6>>3.1 - pharmacy consult for heparin Follow-up: TBD   Plan: I am going to clamp his NG.  I get asked PT to work on trying to get him out of bed today no ones done that.  Try and get him some help with doing incentive spirometry.  I am not sure we can do it but we are going to try. Continue clear liquids.  I talked to both his parents this morning also, they will have his his aide try and bring his chair up today.  The aide also knows how to get him out of bed into the chair so he can help teach the staff how to do this.  Recheck labs in AM.       LOS: 6 days    Melanee Cordial 02/22/2019 Please see Amion

## 2019-02-22 NOTE — Progress Notes (Signed)
PT Cancellation Note  Patient Details Name: [name] MRN: [id number] DOB: 1980/08/21   Cancelled Treatment:    Reason Eval/Treat Not Completed: PT screened, no needs identified, will sign off. PT was re-consulted due to the pt remaining in bed since he was admitted ~1 week ago. After discussion with the pt and multiple staff members, the pt's personal WC is a tilt-in-space WC to aide in pressure relief as well as having supports built in for safety. The pt voiced concerns about being lifted to the recliner due to inadequate pressure relief/cushioning. This concern was relayed to RN staff in order to encourage family to bring the [facility] as soon as possible. As stated in the prior PT note (from 02/17/2019), the pt is at his baseline level of functioning and mobility, and therefore, unfortunately has no acute PT needs. The pt will benefit most from the arrival of his personal WC, and then staff can use a maximove lift to complete the transfer from bed-WC and back. Thank you for your commitment to pt mobility, and re-consulting PT.   [name], PT, DPT   Acute Rehabilitation Department Pager #: [phone]   Otho Bellows 02/22/2019, 4:24 PM

## 2019-02-22 NOTE — Plan of Care (Signed)
  Problem: Education: Goal: Knowledge of General Education information will improve Description: Including pain rating scale, medication(s)/side effects and non-pharmacologic comfort measures Outcome: Progressing   Problem: Health Behavior/Discharge Planning: Goal: Ability to manage health-related needs will improve Outcome: Progressing   Problem: Clinical Measurements: Goal: Diagnostic test results will improve Outcome: Progressing Goal: Respiratory complications will improve Outcome: Progressing Goal: Cardiovascular complication will be avoided Outcome: Progressing   Problem: Coping: Goal: Level of anxiety will decrease Outcome: Progressing   Problem: Elimination: Goal: Will not experience complications related to bowel motility Outcome: Progressing Goal: Will not experience complications related to urinary retention Outcome: Progressing   Problem: Pain Managment: Goal: General experience of comfort will improve Outcome: Progressing   Problem: Safety: Goal: Ability to remain free from injury will improve Outcome: Progressing   Problem: Skin Integrity: Goal: Risk for impaired skin integrity will decrease Outcome: Progressing

## 2019-02-23 ENCOUNTER — Inpatient Hospital Stay (HOSPITAL_COMMUNITY): Payer: Medicare Other

## 2019-02-23 LAB — CBC
HCT: 41 % (ref 39.0–52.0)
Hemoglobin: 13.3 g/dL (ref 13.0–17.0)
MCH: 30.3 pg (ref 26.0–34.0)
MCHC: 32.4 g/dL (ref 30.0–36.0)
MCV: 93.4 fL (ref 80.0–100.0)
Platelets: 240 10*3/uL (ref 150–400)
RBC: 4.39 MIL/uL (ref 4.22–5.81)
RDW: 13.3 % (ref 11.5–15.5)
WBC: 8.6 10*3/uL (ref 4.0–10.5)
nRBC: 0 % (ref 0.0–0.2)

## 2019-02-23 LAB — BASIC METABOLIC PANEL
Anion gap: 8 (ref 5–15)
BUN: <5 mg/dL — ABNORMAL LOW (ref 6–20)
CO2: 27 mmol/L (ref 22–32)
Calcium: 8.4 mg/dL — ABNORMAL LOW (ref 8.9–10.3)
Chloride: 110 mmol/L (ref 98–111)
Creatinine, Ser: 0.65 mg/dL (ref 0.61–1.24)
GFR calc Af Amer: >60 mL/min (ref >60)
GFR calc non Af Amer: >60 mL/min (ref >60)
Glucose, Bld: 105 mg/dL — ABNORMAL HIGH (ref 70–99)
Potassium: 4.4 mmol/L (ref 3.5–5.1)
Sodium: 145 mmol/L (ref 135–145)

## 2019-02-23 LAB — PROTIME-INR
INR: 2.9 — ABNORMAL HIGH (ref 0.8–1.2)
Prothrombin Time: 30.6 seconds — ABNORMAL HIGH (ref 11.4–15.2)

## 2019-02-23 MED ORDER — FLEET ENEMA 7-19 GM/118ML RE ENEM: 1.0000 | ENEMA | Freq: Every day | RECTAL | Status: DC | PRN

## 2019-02-23 NOTE — Progress Notes (Addendum)
CC: Abdominal pain  Subjective: As far as we can tell he had no BM yesterday.  His NG has been clamped and he has had some clears.  I am going to recheck his film and then hopefully pull his NG out later and advance him to full's.  He has his chair today, and hopefully will be able to get him up in his chair.  Objective: Vital signs in last 24 hours: Temp:  [96.7 F (35.9 C)-98.4 F (36.9 C)] 97.7 F (36.5 C) (01/26 0606) Pulse Rate:  [79-98] 85 (01/26 0606) Resp:  [16-17] 17 (01/26 0606) BP: (103-138)/(63-91) 123/74 (01/26 0606) SpO2:  [93 %-100 %] 93 % (01/26 0606) Last BM Date: 02/20/19 300 p.o. recorded IV not recorded Urine 900 750 from the NG for shift 0 recorded for the second shift. No BM recorded Afebrile vital signs are stable Labs are all normal.   INR is 2.9. Intake/Output from previous day: 01/25 0701 - 01/26 0700 In: 300 [P.O.:300] Out: 1650 [Urine:900; Emesis/NG output:750] Intake/Output this shift: No intake/output data recorded.  General appearance: alert, cooperative and no distress Resp: clear to auscultation bilaterally Cardio: regular rate and rhythm, S1, S2 normal, no murmur, click, rub or gallop GI: Abdomen soft positive bowel sounds no BM he is tolerating the NG clamped overnight. Extremities: He is rather bruised and marked from being in bed, spasms and blood draws to monitor his Coumadin.  Lab Results:  Recent Labs    02/22/19 1141 02/23/19 0303  WBC 7.2 8.6  HGB 13.4 13.3  HCT 41.4 41.0  PLT 231 240    BMET Recent Labs    02/22/19 0836 02/23/19 0303  NA 143 145  K 4.2 4.4  CL 111 110  CO2 20* 27  GLUCOSE 104* 105*  BUN <5* <5*  CREATININE 0.68 0.65  CALCIUM 7.9* 8.4*   PT/INR Recent Labs    02/22/19 1130 02/23/19 0303  LABPROT 30.1* 30.6*  INR 2.9* 2.9*    Recent Labs  Lab 02/16/19 1730 02/21/19 0810  AST 23 15  ALT 16 11  ALKPHOS 63 43  BILITOT 1.1 0.9  PROT 7.3 5.4*  ALBUMIN 4.2 2.7*     Lipase    Component Value Date/Time   LIPASE 19 02/16/2019 1730     Medications: . diazepam  2 mg Oral BID  . feeding supplement  1 Container Oral TID BM  . 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,&12/15/2017 [name]   FEN: IV fluids@ 177ml/hr - sips and chips ID: Zosyn x1 dose 02/16/2019 DVT: SCDs- INR2.1>>2.6>>3.6>>3.1>>2.9 - pharmacy consult for heparin Follow-up: TBD   Plan: I can recheck his films today we have not had one for couple days.  His abdomen feels softer but no BM yesterday without the enema.  If his film is okay I will go ahead and pull his NG and put him up to full liquids.  His chair has arrived and will work on getting him out of bed today.  I talked to his mother this a.m. and reported our progress and discussed his care.  Total time reviewing case, examining  patient, discussing with staff and family 35 minutes      LOS: 7 days    Kylei Purington 02/23/2019 Please see Amion

## 2019-02-23 NOTE — Plan of Care (Signed)

## 2019-02-23 NOTE — Progress Notes (Signed)
This RN noticed a reddened area on the pt. left elbow. Placed a foam dressing on the elbow to provide some protection. The pt. is not complaining of any pain just a little soreness and declined pain medicine at this time. Will continue to monitor.

## 2019-02-23 NOTE — Progress Notes (Signed)
ANTICOAGULATION CONSULT NOTE - Follow Up Consult  Pharmacy Consult for Heparin when INR <2 Indication: hx DVT  Allergies  Allergen Reactions  . Betadine [Povidone Iodine] Other (See Comments)    Unknown reaction per caregiver  . Ciprofloxacin Other (See Comments)    Unknown reaction per caregiver  . Fentanyl Other (See Comments)    Previous reaction resulting in intubation, unclear details per family  . Morphine Other (See Comments)    Unknown reaction per caregiver    Patient Measurements: Height: 5\' 5"  (165.1 cm) Weight: 158 lb 11.7 oz (72 kg) IBW/kg (Calculated) : 61.5 Heparin Dosing Weight: 72 kg  Vital Signs: Temp: 97.7 F (36.5 C) (01/26 0606) Temp Source: Oral (01/26 0606) BP: 123/74 (01/26 0606) Pulse Rate: 85 (01/26 0606)  Labs: Recent Labs    02/21/19 0410 02/21/19 0410 02/21/19 0810 02/22/19 0836 02/22/19 1130 02/22/19 1141 02/23/19 0303  HGB 13.5   < >  --   --   --  13.4 13.3  HCT 44.9  --   --   --   --  41.4 41.0  PLT 199  --   --   --   --  231 240  LABPROT 29.7*  --   --   --  30.1*  --  30.6*  INR 2.8*  --   --   --  2.9*  --  2.9*  CREATININE  --   --  0.62 0.68  --   --  0.65   < > = values in this interval not displayed.    Estimated Creatinine Clearance: 107.8 mL/min (by C-G formula based on SCr of 0.65 mg/dL).  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 On admission, INR was therapeutic at 2.1 on admit 1/19 on PTA warfarin 3 mg PO daily. Warfarin held for possible need for surgery. Plan IV heparin with lower heparin level goal.    S/p vitamin K 2.5 mg PO on 1/22 when INR trended up to 3.6.  INR remains therapeutic today, 2.9. Same as yesterday. Last Warfarin dose 1/18.   NG tube clamped 1/25 and on clear liquids.   Goal of Therapy:  Heparin level 0.3-0.5 units/ml Monitor platelets by anticoagulation protocol: Yes   Plan:   No heparin yet, as INR  remains >2.  Daily PT/INR.  Warfarin on hold.  Will follow up for starting heparin or resuming warfarin when able.  Arty Baumgartner, [place] Phone: [phone] 02/23/2019,10:30 AM

## 2019-02-23 NOTE — Progress Notes (Signed)
Staff along with [name], [name]'s caretaker,  were able to transfer the patient into his personal wheelchair. We attempted to use the maximove but because of safety concerns we had to abort this attempt. [name] showed the staff how he transfers the patient at home and we were successful in getting him into the wheelchair. Patient only able to sit in the chair for approximately an hour due to [place] having to leave and the staff being ill-equipped to manage getting the patient back to bed safely. I asked [name] if he could come by tomorrow and help [place] again and then if he had to come back later in the day if he could help [place] get him back to bed and he agreed this is something he would be willing to do. Patient returned to bed and is resting comfortably at this time.

## 2019-02-24 ENCOUNTER — Inpatient Hospital Stay (HOSPITAL_COMMUNITY): Payer: Medicare Other

## 2019-02-24 ENCOUNTER — Encounter (HOSPITAL_COMMUNITY): Payer: Medicare Other

## 2019-02-24 DIAGNOSIS — M7989 Other specified soft tissue disorders: Secondary | ICD-10-CM

## 2019-02-24 LAB — PROTIME-INR
INR: 2.1 — ABNORMAL HIGH (ref 0.8–1.2)
Prothrombin Time: 23.7 seconds — ABNORMAL HIGH (ref 11.4–15.2)

## 2019-02-24 MED ORDER — WARFARIN - PHARMACIST DOSING INPATIENT: Freq: Every day | Status: DC

## 2019-02-24 MED ORDER — DIPHENHYDRAMINE HCL 25 MG PO CAPS
25.0000 mg | ORAL_CAPSULE | ORAL | Status: DC | PRN
  Administered 2019-02-24 (×2): 25 mg via ORAL
  Filled 2019-02-24 (×2): qty 1

## 2019-02-24 MED ORDER — WARFARIN SODIUM 5 MG PO TABS
5.0000 mg | ORAL_TABLET | Freq: Once | ORAL | Status: AC
  Administered 2019-02-24: 19:00:00 5 mg via ORAL
  Filled 2019-02-24: qty 1

## 2019-02-24 MED ORDER — CYCLOBENZAPRINE HCL 10 MG PO TABS
10.0000 mg | ORAL_TABLET | Freq: Two times a day (BID) | ORAL | Status: DC
  Administered 2019-02-24 – 2019-02-25 (×3): 10 mg via ORAL
  Filled 2019-02-24 (×3): qty 1

## 2019-02-24 MED ORDER — BACLOFEN 10 MG PO TABS
20.0000 mg | ORAL_TABLET | Freq: Four times a day (QID) | ORAL | Status: DC
  Administered 2019-02-24 – 2019-02-25 (×4): 20 mg via ORAL
  Filled 2019-02-24 (×4): qty 2

## 2019-02-24 MED ORDER — TIZANIDINE HCL 2 MG PO CAPS: 2.0000 mg | ORAL_CAPSULE | ORAL | Status: DC

## 2019-02-24 MED ORDER — FAMOTIDINE 20 MG PO TABS
20.0000 mg | ORAL_TABLET | Freq: Every day | ORAL | Status: DC
  Administered 2019-02-24 – 2019-02-25 (×2): 20 mg via ORAL
  Filled 2019-02-24 (×2): qty 1

## 2019-02-24 MED ORDER — PSYLLIUM 95 % PO PACK
1.0000 | PACK | Freq: Every day | ORAL | Status: DC
  Administered 2019-02-24 – 2019-02-25 (×2): 1 via ORAL
  Filled 2019-02-24 (×2): qty 1

## 2019-02-24 MED ORDER — CYCLOBENZAPRINE HCL 10 MG PO TABS
20.0000 mg | ORAL_TABLET | Freq: Every evening | ORAL | Status: DC
  Administered 2019-02-24: 20:00:00 20 mg via ORAL
  Filled 2019-02-24: qty 2

## 2019-02-24 MED ORDER — TIZANIDINE HCL 2 MG PO TABS
4.0000 mg | ORAL_TABLET | Freq: Every day | ORAL | Status: DC
  Administered 2019-02-24 – 2019-02-25 (×2): 4 mg via ORAL
  Filled 2019-02-24 (×2): qty 2

## 2019-02-24 MED ORDER — METOPROLOL SUCCINATE ER 25 MG PO TB24
50.0000 mg | ORAL_TABLET | Freq: Every day | ORAL | Status: DC
  Administered 2019-02-24 – 2019-02-25 (×2): 50 mg via ORAL
  Filled 2019-02-24 (×2): qty 2

## 2019-02-24 MED ORDER — TIZANIDINE HCL 2 MG PO TABS
2.0000 mg | ORAL_TABLET | Freq: Two times a day (BID) | ORAL | Status: DC
  Administered 2019-02-24 – 2019-02-25 (×2): 2 mg via ORAL
  Filled 2019-02-24 (×2): qty 1

## 2019-02-24 NOTE — Progress Notes (Signed)
ANTICOAGULATION CONSULT NOTE - Follow Up Consult  Pharmacy Consult for Warfarin Indication: hx DVT  Allergies  Allergen Reactions  . Betadine [Povidone Iodine] Other (See Comments)    Unknown reaction per caregiver  . Ciprofloxacin Other (See Comments)    Unknown reaction per caregiver  . Fentanyl Other (See Comments)    Previous reaction resulting in intubation, unclear details per family  . Morphine Other (See Comments)    Unknown reaction per caregiver    Patient Measurements: Height: 5\' 5"  (165.1 cm) Weight: 158 lb 11.7 oz (72 kg) IBW/kg (Calculated) : 61.5  Vital Signs: Temp: 98.1 F (36.7 C) (01/27 1423) Temp Source: Axillary (01/27 1423) BP: 117/63 (01/27 1423) Pulse Rate: 87 (01/27 1423)  Labs: Recent Labs    02/22/19 0836 02/22/19 1130 02/22/19 1141 02/23/19 0303 02/24/19 0729  HGB  --   --  13.4 13.3  --   HCT  --   --  41.4 41.0  --   PLT  --   --  231 240  --   LABPROT  --  30.1*  --  30.6* 23.7*  INR  --  2.9*  --  2.9* 2.1*  CREATININE 0.68  --   --  0.65  --     Estimated Creatinine Clearance: 107.8 mL/min (by C-G formula based on SCr of 0.65 mg/dL).  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 On admission, INR was therapeutic at 2.1 on PTA warfarin 3 mg PO daily. Warfarin held for possible need for surgery. Planned IV heparin with lower heparin level goal.  Never needed IV heparin as INR never dropped below 2.  NG tube out 1/26 and now tolerating diet.  Warfarin to resume.    Vitamin K 2.5 mg PO was given on 1/22 when INR trended up to 3.6, but INR has been slow to decline.  INR 2.8>2.9>2.9 and now 2.1.  Expect further decline by am.    PTA warfarin regimen: 3 mg daily.  Last dose 1/18 PTA  Goal of Therapy:  INR 2-3 Monitor platelets by anticoagulation protocol: Yes   Plan:   Warfarin 5 mg x 1 today.  Daily PT/INR.   Arty Baumgartner,  [place] Phone: [phone] 02/24/2019,4:19 PM

## 2019-02-24 NOTE — Progress Notes (Signed)
Red dots, not raised, noticed on pt's back, back of thighs, and few on front of thighs/legs. Pt caregiver at bedside and reports this is new. Pt denies itching. Earnstine Regal, PA paged. Awaiting response. Will continue to monitor.

## 2019-02-24 NOTE — Progress Notes (Signed)
    CC: Abdominal pain  Subjective: 3 BMs recorded, his abdomen is soft he has bowel sounds.  He says he wants food.  Objective: Vital signs in last 24 hours: Temp:  [97.1 F (36.2 C)-98 F (36.7 C)] 97.1 F (36.2 C) (01/27 0524) Pulse Rate:  [79-93] 79 (01/27 0524) Resp:  [17-20] 18 (01/27 0524) BP: (114-132)/(72-82) 114/77 (01/27 0524) SpO2:  [92 %-100 %] 100 % (01/27 0524) Last BM Date: 02/23/19 300 p.o. recorded 7 L IV recorded (IV @ 125 cc/hr) Voided 1300 Stool x 3 Afebrile vital signs are stable No labs today Intake/Output from previous day: 01/26 0701 - 01/27 0700 In: 7231.8 [P.O.:300; I.V.:6071.9; IV Piggyback:860] Out: 1300 [Urine:1300] Intake/Output this shift: No intake/output data recorded.  General appearance: alert, cooperative and no distress Resp: clear to auscultation bilaterally Cardio: Regular rate and rhythm GI: soft, non-tender; bowel sounds normal; no masses,  no organomegaly Extremities: Some bruising from IV sites and punctures.  No edema.  Lab Results:  Recent Labs    02/22/19 1141 02/23/19 0303  WBC 7.2 8.6  HGB 13.4 13.3  HCT 41.4 41.0  PLT 231 240    BMET Recent Labs    02/22/19 0836 02/23/19 0303  NA 143 145  K 4.2 4.4  CL 111 110  CO2 20* 27  GLUCOSE 104* 105*  BUN <5* <5*  CREATININE 0.68 0.65  CALCIUM 7.9* 8.4*   PT/INR Recent Labs    02/22/19 1130 02/23/19 0303  LABPROT 30.1* 30.6*  INR 2.9* 2.9*    Recent Labs  Lab 02/21/19 0810  AST 15  ALT 11  ALKPHOS 43  BILITOT 0.9  PROT 5.4*  ALBUMIN 2.7*     Lipase     Component Value Date/Time   LIPASE 19 02/16/2019 1730     Medications: . diazepam  2 mg Oral BID  . feeding supplement  1 Container Oral TID BM  . 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,&12/15/2017 [name]   FEN: IV fluids@ 147ml/hr - sips and chips ID: Zosyn x1 dose 02/16/2019 DVT: SCDs- INR2.1>>2.6>>3.6>>3.1>>2.9 >> pending today - pharmacy consult for heparin Follow-up: TBD  Plan: Try him on a soft diet we will put him on some fiber and see how he progresses.  Hopefully he will be able to get up in his chair again today for a longer period.  I will have reviewed with his family, he will go home with his aide to his apartment.  I will talk with him later today and let him know if he does well with PO and has a BM he may go home next 24-48 hours.       LOS: 8 days    [name] 02/24/2019 Please see Amion

## 2019-02-24 NOTE — Progress Notes (Signed)
Left lower extremity venous duplex has been completed. Preliminary results can be found in CV Proc through chart review.  Results were given to the patient's nurse, Kristi.   02/24/19 5:30 PM [name] RVT

## 2019-02-24 NOTE — Plan of Care (Signed)

## 2019-02-25 LAB — PROTIME-INR
INR: 1.6 — ABNORMAL HIGH (ref 0.8–1.2)
Prothrombin Time: 18.5 seconds — ABNORMAL HIGH (ref 11.4–15.2)

## 2019-02-25 MED ORDER — FUROSEMIDE 20 MG PO TABS
10.0000 mg | ORAL_TABLET | Freq: Once | ORAL | Status: AC
  Administered 2019-02-25: 10:00:00 10 mg via ORAL
  Filled 2019-02-25: qty 1

## 2019-02-25 MED ORDER — PSYLLIUM 95 % PO PACK: PACK | ORAL | 0 refills | Status: AC

## 2019-02-25 MED ORDER — WARFARIN SODIUM 5 MG PO TABS: 5.0000 mg | ORAL_TABLET | Freq: Once | ORAL | Status: DC

## 2019-02-25 MED ORDER — ENOXAPARIN SODIUM 80 MG/0.8ML ~~LOC~~ SOLN: SUBCUTANEOUS | 1 refills | Status: DC

## 2019-02-25 MED ORDER — METOPROLOL SUCCINATE ER 50 MG PO TB24: 50.0000 mg | ORAL_TABLET | Freq: Every day | ORAL | 0 refills | Status: AC

## 2019-02-25 MED ORDER — METOPROLOL SUCCINATE ER 100 MG PO TB24: ORAL_TABLET | ORAL | Status: DC

## 2019-02-25 MED ORDER — ENOXAPARIN SODIUM 80 MG/0.8ML ~~LOC~~ SOLN
70.0000 mg | Freq: Two times a day (BID) | SUBCUTANEOUS | Status: DC
  Administered 2019-02-25: 10:00:00 70 mg via SUBCUTANEOUS
  Filled 2019-02-25 (×2): qty 0.8

## 2019-02-25 MED FILL — ENOXAPARIN SODIUM 80 MG/0.8: 80 | 7 days supply | Qty: 11 | Fill #0

## 2019-02-25 MED FILL — METOPROLOL SUCCINATE ER 50: 50 | 30 days supply | Qty: 30 | Fill #0

## 2019-02-25 NOTE — Discharge Summary (Signed)
Physician Discharge Summary  Patient ID: [name] MRN: [id number] DOB/AGE: [birth year] 39 y.o.  Admit date: 02/16/2019 Discharge date: 02/25/2019  Admission Diagnoses:  SBO Recurrent small bowel obstruction/SB resection 2000 [name],&12/15/2017 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 Per that patient's parents he is verbal at baseline, but the patient was non-communicative with me during exam and history is obtained from the parents and from chart review. He was brought in for abdominal pain present since 1/18 and one episode of emesis. Mom reports that he stated he felt unwell all day and "felt the same symptoms as previous bowel obstructions". He reportedly had a BM yesterday and an enema today with another normal BM. He has a history of multiple bowel obstructions in the past. In 2000, he had a bowel resection by [name](?) at [facility] after presenting with a bowel obstruction. He has has two additional episodes of obstruction since then and the most recent episode underwent ex-lap and LOA without identifiable transition point by [name] in 2019. He wears a condom cath and is wheelchair-bound at baseline. He is on Coumadin for a history of DVT x2-3 per parents.   Patient was admitted NG tube was placed, he was placed on bowel rest and IV hydration.  We will check protimes and had pharmacy follow his anticoagulation  throughout his hospitalization.  It took several days of NG decompression, hydration, and bowel rest before he started having bowel movements.  We also started him on enemas to help with this process.  He eventually started opening up and we were able to clamp his NG, start him on clears, progressed to full liquid diet and then a soft diet.  His bowel function has returned to normal.  He is having bowel movements and tolerating a soft diet without enemas.  We have added Metamucil to his daily medications.  He can resume his preadmission bowel regime with the addition of Metamucil.  His blood pressure was normal we kept him on IV Lopressor initially and then resumed his Toprol-XL at 50 mg daily, because his blood pressure was normal, and heart rate was well controlled at this dosage.  He did not receive the HydroDIURIL while he was here and has not needed it.  His blood pressures ranged from 142/84 as a high to 118/69 was his last blood pressure here in the hospital today.  We have resumed this for his discharge but recommend he see his PCP and allow them to review his medicines.  His INR remained above 2 up until the last day at which time it dropped to 1.6.  We started him on Lovenox at this point.  His caretaker [name] feels he can handle giving him Lovenox at home until his INR is up to 2.0 again.  [name] checks his INR at home and has been instructed to check it daily.  He is to continue his home dose of Coumadin as before.  Once his INR is up to 2.0 he can discontinue the  Lovenox.  He is instructed to call the PCP for further instructions or questions about his anticoagulation.  He was noted to have some swelling of his left thigh on 02/24/2019.  As noted above had been therapeutic but we checked his left lower extremity for DVT and it was negative.  On discharge his leg is still somewhat swollen but soft and not tender.  We gave him 1 dose of Lasix 10 mg p.o. the a.m. of discharge.  We have  recommended he contact his PCP and get an appointment for next week so they can review all of his medicines.  Currently the patient is tolerating soft diet having regular bowel movements.  We have added Metamucil to his daily bowel regime.  His Coumadin has been restarted and he is on a Lovenox for bridging until his INR is 2.0 or greater.  His Toprol-XL 100 mg daily has been decreased to 50 mg daily, he has a 30-day supply of this.  We have restarted his HydroDIURIL.,  He is also back on all his preadmission medicines as before.  We are recommending he see his PCP next week to review all of his medicines, and to be sure he has no issues with his anticoagulation.  CBC Latest Ref Rng & Units 02/23/2019 02/22/2019 02/21/2019  WBC 4.0 - 10.5 K/uL 8.6 7.2 7.5  Hemoglobin 13.0 - 17.0 g/dL 13.3 13.4 13.5  Hematocrit 39.0 - 52.0 % 41.0 41.4 44.9  Platelets 150 - 400 K/uL 240 231 199    CMP Latest Ref Rng & Units 02/23/2019 02/22/2019 02/21/2019  Glucose 70 - 99 mg/dL 105(H) 104(H) 111(H)  BUN 6 - 20 mg/dL <5(L) <5(L) 6  Creatinine 0.61 - 1.24 mg/dL 0.65 0.68 0.62  Sodium 135 - 145 mmol/L 145 143 143  Potassium 3.5 - 5.1 mmol/L 4.4 4.2 4.6  Chloride 98 - 111 mmol/L 110 111 112(H)  CO2 22 - 32 mmol/L 27 20(L) 22  Calcium 8.9 - 10.3 mg/dL 8.4(L) 7.9(L) 8.0(L)  Total Protein 6.5 - 8.1 g/dL - - 5.4(L)  Total Bilirubin 0.3 - 1.2 mg/dL - - 0.9  Alkaline Phos 38 - 126 U/L - - 43  AST 15 - 41 U/L - - 15  ALT 0 - 44 U/L - - 11    DVT: SCDs- INR2.1>2.6>3.6>3.1>2.8>2.9>2.9>2.1>1.6 Lovenox 70 mg q 12 started 02/25/19.  Condition on DC:  Improved.    Disposition: home    Allergies as of 02/25/2019      Reactions   Betadine [povidone Iodine] Other (See Comments)   Unknown reaction per caregiver   Ciprofloxacin Other (See Comments)   Unknown reaction per caregiver   Fentanyl Other (See Comments)   Previous reaction resulting in intubation, unclear details per family   Morphine Other (See Comments)    Unknown reaction per caregiver      Medication List    TAKE these medications   acetaminophen 325 MG tablet Commonly known as: TYLENOL Take 650 mg by mouth every 6 (six) hours as needed. For pain   baclofen 20 MG tablet Commonly known as: LIORESAL Take 1 tablet (20 mg total) by mouth 4 (four) times daily.   calcium carbonate 500 MG chewable tablet Commonly known as: TUMS - dosed in mg elemental calcium Chew 1 tablet by mouth 3 (three) times daily.   cetirizine 10 MG tablet Commonly known as: ZYRTEC Take 10 mg by mouth daily.   cholecalciferol 1000 units tablet Commonly known as: VITAMIN D Take 1,000 Units by mouth  2 (two) times daily.   cyclobenzaprine 10 MG tablet Commonly known as: FLEXERIL 1 at 8am and 12pm and 2 tabs at 8pm. What changed:   how much to take  how to take this  when to take this  additional instructions   Delsym 30 MG/5ML liquid Generic drug: dextromethorphan Take 30 mg by mouth as needed for cough.   diazepam 2 MG tablet Commonly known as: VALIUM Take 1 tablet (2 mg total) by mouth 2 (two) times daily. Must last 30 days.   diphenhydrAMINE 25 MG tablet Commonly known as: BENADRYL Take 25 mg by mouth every 6 (six) hours as needed for allergies.   enoxaparin 80 MG/0.8ML injection Commonly known as: LOVENOX He needs 70 mg of Lovenox every 12 hours until his INR is 2.0 or higher.   fluticasone 27.5 MCG/SPRAY nasal spray Commonly known as: VERAMYST Place 1 spray into the nose at bedtime as needed for rhinitis.   furosemide 20 MG tablet Commonly known as: LASIX Take 20 mg by mouth daily as needed for fluid (leg edema).   Gel-Kam 0.4 % Gel Generic drug: Stannous Fluoride Place 1 application onto teeth daily.   glycerin adult 2 g suppository Place 1 suppository rectally as needed for constipation.   hydrochlorothiazide 25 MG tablet Commonly known as: HYDRODIURIL Take 12.5 mg by mouth daily.   loperamide 2 MG capsule Commonly known  as: IMODIUM Take 2 mg by mouth 3 (three) times daily as needed for diarrhea or loose stools. Reported on 05/11/2015   metoprolol succinate 50 MG 24 hr tablet Commonly known as: TOPROL-XL Take 1 tablet (50 mg total) by mouth daily. Start taking on: February 26, 2019 What changed:   medication strength  how much to take   mineral oil enema Place 1 enema rectally as needed for severe constipation.   multivitamin with minerals Tabs tablet Take 1 tablet by mouth daily.   nitrofurantoin 50 MG capsule Commonly known as: MACRODANTIN Take 50 mg by mouth daily.   ondansetron 4 MG disintegrating tablet Commonly known as: ZOFRAN-ODT Take 4 mg by mouth every 8 (eight) hours as needed for nausea or vomiting.   PEG 3350 17 GM/SCOOP Powd Take 17 g by mouth daily as needed for constipation.   psyllium 95 % Pack Commonly known as: HYDROCIL/METAMUCIL Give him one dose daily as directed on the package.  This is to help with his constipation.  You can buy this over the counter at any drug store.   senna-docusate 8.6-50 MG tablet Commonly known as: Senokot-S Take 3 tablets by mouth at bedtime.   tizanidine 2 MG capsule Commonly known as: ZANAFLEX Take 1 in the morning, take 2 at 12 pm, take 1 in evening What changed:   how much to take  how to take this  when to take this   traMADol 50 MG tablet Commonly known as: ULTRAM Take 100 mg by mouth every 6 (six) hours as needed for moderate pain.   warfarin 3 MG tablet Commonly known as: COUMADIN Take 3 mg by mouth daily at 6 PM.            Durable Medical Equipment  (From admission, onward)         Start     Ordered   02/19/19 1547  For home use only DME Other see comment  Once    CommentsBrynda Greathouse [place]  Question:  Length of Need  Answer:  Lifetime   02/19/19 1546  Follow-up Information    [facility] Follow up.   Why: Call and arrange a follow up for all his  medicines.  Let them know he was in the hospital and we have decreased his BP medicines.   Contact information: [address] [place] [phone]          SignedEarnstine Regal 02/25/2019, 11:57 AM

## 2019-02-25 NOTE — Progress Notes (Addendum)
CC: Abdominal pain  Subjective: He is tolerating soft diet well.  He had 3 bowel movements yesterday.  His abdomen is soft and nontender and he is hungry and wants food.  His INR is dropped down to 1.6.  Objective: Vital signs in last 24 hours: Temp:  [97.5 F (36.4 C)-98.2 F (36.8 C)] 97.5 F (36.4 C) (01/28 0513) Pulse Rate:  [80-99] 80 (01/28 0513) Resp:  [17-20] 20 (01/28 0513) BP: (113-138)/(63-85) 118/69 (01/28 0513) SpO2:  [99 %-100 %] 100 % (01/28 0513) Last BM Date: 02/24/19 1197 p.o. 1100 urine BM x3 Afebrile vital signs are stable INR 1.6 Intake/Output from previous day: 01/27 0701 - 01/28 0700 In: 1197 [P.O.:1197] Out: 1100 [Urine:1100] Intake/Output this shift: No intake/output data recorded.  General appearance: alert, cooperative and no distress Resp: clear to auscultation bilaterally Cardio: He is somewhat tachycardic but regular. GI: soft, non-tender; bowel sounds normal; no masses,  no organomegaly and He had 3 bowel movements yesterday. Extremities: Left thigh remains larger than the right.  Vascular study was negative for DVT. Skin: Rash seems better this a.m.  Lab Results:  Recent Labs    02/22/19 1141 02/23/19 0303  WBC 7.2 8.6  HGB 13.4 13.3  HCT 41.4 41.0  PLT 231 240    BMET Recent Labs    02/22/19 0836 02/23/19 0303  NA 143 145  K 4.2 4.4  CL 111 110  CO2 20* 27  GLUCOSE 104* 105*  BUN <5* <5*  CREATININE 0.68 0.65  CALCIUM 7.9* 8.4*   PT/INR Recent Labs    02/24/19 0729 02/25/19 0243  LABPROT 23.7* 18.5*  INR 2.1* 1.6*    Recent Labs  Lab 02/21/19 0810  AST 15  ALT 11  ALKPHOS 43  BILITOT 0.9  PROT 5.4*  ALBUMIN 2.7*     Lipase     Component Value Date/Time   LIPASE 19 02/16/2019 1730     Medications: . baclofen  20 mg Oral QID  . cyclobenzaprine  10 mg Oral BID   And  . cyclobenzaprine  20 mg Oral QPM  . diazepam  2 mg Oral BID  . famotidine  20 mg Oral Daily  . feeding supplement  1  Container Oral TID BM  . metoprolol succinate  50 mg Oral Daily  . psyllium  1 packet Oral Daily  . tiZANidine  2 mg Oral BID   And  . tiZANidine  4 mg Oral Q1200  . 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,&12/15/2017 [name]   FEN: IV fluids@ 167ml/hr - sips and chips ID: Zosyn x1 dose 02/16/2019 DVT: SCDs- INR2.1>>2.6>>3.6>>3.1>>2.9>2.1>1.6 - coumadin restarted 1/27 - pharmacy following Follow-up: TBD [facility] [name] [phone]  Plan: I will ask pharmacy to dose him for Lovenox, will ask case manager and his caretaker [name] if they can do Lovenox at home.  If we can bridge him with Lovenox at home we can discharge him today or in the a.m.  If we cannot bridge him we will continue Coumadin per pharmacy and keep him on until he is  therapeutic.  I am also giving a low-dose Lasix this a.m.  He is really not needed any of his blood pressure medicines while he was here.  Is on HydroDIURIL and Toprol at home.  We restarted the Toprol-XL but at 50 mg versus the 100 mg/day at home.  Patient's caretaker [name] is here and he thinks he can do injections for the Lovenox.  [name] checks his pro time at home.  I have instructed him to check daily and that he can stop the Lovenox when he gets to an INR of 2.0.  We will work on getting him discharged home this afternoon.    I spent 30 minutes reviewing patient data, examining patient, discussing with family, and pharmacy  consult.       LOS: 9 days    Shanna Un 02/25/2019 Please see Amion

## 2019-02-25 NOTE — Plan of Care (Signed)

## 2019-02-25 NOTE — Progress Notes (Signed)
ANTICOAGULATION CONSULT NOTE - Follow Up Consult  Pharmacy Consult for Warfarin and Enoxaparin Indication: hx DVT  Allergies  Allergen Reactions  . Betadine [Povidone Iodine] Other (See Comments)    Unknown reaction per caregiver  . Ciprofloxacin Other (See Comments)    Unknown reaction per caregiver  . Fentanyl Other (See Comments)    Previous reaction resulting in intubation, unclear details per family  . Morphine Other (See Comments)    Unknown reaction per caregiver    Patient Measurements: Height: 5\' 5"  (165.1 cm) Weight: 158 lb 11.7 oz (72 kg) IBW/kg (Calculated) : 61.5 Enoxaparin dosing weight: 72 kg  Vital Signs: Temp: 97.5 F (36.4 C) (01/28 0513) Temp Source: Oral (01/28 0513) BP: 118/69 (01/28 0513) Pulse Rate: 80 (01/28 0513)  Labs: Recent Labs    02/23/19 0303 02/24/19 0729 02/25/19 0243  HGB 13.3  --   --   HCT 41.0  --   --   PLT 240  --   --   LABPROT 30.6* 23.7* 18.5*  INR 2.9* 2.1* 1.6*  CREATININE 0.65  --   --     Estimated Creatinine Clearance: 107.8 mL/min (by C-G formula based on SCr of 0.65 mg/dL).  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 On admission, INR was therapeutic at 2.1 on PTA warfarin 3 mg PO daily. Warfarin held for possible need for surgery. Planned IV heparin with lower heparin level goal.  Never needed IV heparin as INR never dropped below 2.  NG tube out 1/26 and now tolerating diet.  Warfarin resumed 1/27    Vitamin K 2.5 mg PO was given on 1/22 when INR trended up to 3.6, but INR had been slow to decline.  INR 2.8>2.9>2.9 > then siginficantly lower on 1/27 > warfarin resumed with 5 mg > INR now subtherapeutic 1.6 as anticipated.  Enoxaparin bridge added this am.    PTA warfarin regimen: 3 mg daily.  Last dose 1/18 PTA  Goal of Therapy:  INR 2-3 Anti-Xa level 0.6-1 units/ml 4hrs after LMWH dose given Monitor platelets by  anticoagulation protocol: Yes   Plan:   Enoxaparin 70 mg SQ q12hr.  Warfarin 5 mg had been planned as inpatient today  Now discharging.  PT/INR daily > Enoxaparin to stop when INR >2.   Arty Baumgartner, [place] Phone: [phone] 02/25/2019,2:51 PM

## 2019-02-25 NOTE — Progress Notes (Signed)
AVS given and reviewed with pt and pt's caregiver, [name]. Medications delivered to bedside by transitions of care pharmacy and discussed with pt and [facility]. [name] verbalized understanding of lovenox injections. All questions answered to satisfaction. Pt and [name] verbalized understanding of information given. Pt to be escorted off the unit with all belongings via personal  wheelchair by [name] and staff member.

## 2019-02-25 NOTE — Discharge Instructions (Signed)
Bowel Obstruction Work to make sure he does not get constipated A bowel obstruction means that something is blocking the small or large bowel. The bowel is also called the intestine. It is the long tube that connects the stomach to the opening of the butt (anus). When something blocks the bowel, food and fluids cannot pass through like normal. This condition needs to be treated. Treatment depends on the cause of the problem and how bad the problem is. What are the causes? Common causes of this condition include:  Scar tissue (adhesions) from past surgery or from high-energy X-rays (radiation).  Recent surgery in the belly. This affects how food moves in the bowel.  Some diseases, such as: ? Irritation of the lining of the digestive tract (Crohn's disease). ? Irritation of small pouches in the bowel (diverticulitis).  Growths or tumors.  A bulging organ (hernia).  Twisting of the bowel (volvulus).  A foreign body.  Slipping of a part of the bowel into another part (intussusception). What are the signs or symptoms? Symptoms of this condition include:  Pain in the belly.  Feeling sick to your stomach (nauseous).  Throwing up (vomiting).  Bloating in the belly.  Being unable to pass gas.  Trouble pooping (constipation).  Watery poop (diarrhea).  A lot of belching. How is this diagnosed? This condition may be diagnosed based on:  A physical exam.  Medical history.  Imaging tests, such as X-ray or CT scan.  Blood tests.  Urine tests. How is this treated? Treatment for this condition may include:  Fluids and pain medicines that are given through an IV tube. Your doctor may tell you not to eat or drink if you feel sick to your stomach and are throwing up.  Eating a clear liquid diet for a few days.  Putting a small tube (nasogastric tube) into the stomach. This will help with pain, discomfort, and nausea by removing blocked air and fluids from the  stomach.  Surgery. This may be needed if other treatments do not work. Follow these instructions at home: Medicines  Take over-the-counter and prescription medicines only as told by your doctor.  If you were prescribed an antibiotic medicine, take it as told by your doctor. Do not stop taking the antibiotic even if you start to feel better. General instructions  Follow your diet as told by your doctor. You may need to: ? Only drink clear liquids until you start to get better. ? Avoid solid foods.  Return to your normal activities as told by your doctor. Ask your doctor what activities are safe for you.  Do not sit for a long time without moving. Get up to take short walks every 1-2 hours. This is important. Ask for help if you feel weak or unsteady.  Keep all follow-up visits as told by your doctor. This is important. How is this prevented? After having a bowel obstruction, you may be more likely to have another. You can do some things to stop it from happening again.  If you have a long-term (chronic) disease, contact your doctor if you see changes or problems.  Take steps to prevent or treat trouble pooping. Your doctor may ask that you: ? Drink enough fluid to keep your pee (urine) pale yellow. ? Take over-the-counter or prescription medicines. ? Eat foods that are high in fiber. These include beans, whole grains, and fresh fruits and vegetables. ? Limit foods that are high in fat and sugar. These include fried or sweet foods.  Stay active. Ask your doctor which exercises are safe for you.  Avoid stress.  Eat three small meals and three small snacks each day.  Work with a Publishing rights manager (dietitian) to make a meal plan that works for you.  Do not use any products that contain nicotine or tobacco, such as cigarettes and e-cigarettes. If you need help quitting, ask your doctor. Contact a doctor if:  You have a fever.  You have chills. Get help right away if:  You have pain  or cramps that get worse.  You throw up blood.  You are sick to your stomach.  You cannot stop throwing up.  You cannot drink fluids.  You feel mixed up (confused).  You feel very thirsty (dehydrated).  Your belly gets more bloated.  You feel weak or you pass out (faint). Summary  A bowel obstruction means that something is blocking the small or large bowel.  Treatment may include IV fluids and pain medicine. You may also have a clear liquid diet, a small tube in your stomach, or surgery.  Drink clear liquids and avoid solid foods until you get better. This information is not intended to replace advice given to you by your health care provider. Make sure you discuss any questions you have with your health care provider. Document Revised: 05/28/2017 Document Reviewed: 05/28/2017 Elsevier Patient Education  [place]. Irregular bowel habits such as constipation and diarrhea can lead to many problems over time.  Having one soft bowel movement a day is the most important way to prevent further problems.  The anorectal canal is designed to handle stretching and feces to safely manage our ability to get rid of solid waste (feces, poop, stool) out of our body.  BUT, hard constipated stools can act like ripping concrete bricks and diarrhea can be a burning fire to this very sensitive area of our body, causing inflamed hemorrhoids, anal fissures, increasing risk is perirectal abscesses, abdominal pain/bloating, an making irritable bowel worse.     The goal: ONE SOFT BOWEL MOVEMENT A DAY!  To have soft, regular bowel movements:   Drink at least 8 tall glasses of water a day.    Take plenty of fiber.  Fiber is the undigested part of plant food that passes into the colon, acting s natures broom to encourage bowel motility and movement.  Fiber can absorb and hold large amounts of water. This results in a larger, bulkier stool, which is soft and easier to  pass. Work gradually over several weeks up to 6 servings a day of fiber (25g a day even more if needed) in the form of: o Vegetables -- Root (potatoes, carrots, turnips), leafy green (lettuce, salad greens, celery, spinach), or cooked high residue (cabbage, broccoli, etc) o Fruit -- Fresh (unpeeled skin & pulp), Dried (prunes, apricots, cherries, etc ),  or stewed ( applesauce)  o Whole grain breads, pasta, etc (whole wheat)  o Bran cereals   Bulking Agents -- This type of water-retaining fiber generally is easily obtained each day by one of the following:  o Psyllium bran -- The psyllium plant is remarkable because its ground seeds can retain so much water. This product is available as Metamucil, Konsyl, Effersyllium, Per Diem Fiber, or the less expensive generic preparation in drug and health food stores. Although labeled a laxative, it really is not a laxative.  o Methylcellulose -- This is another fiber derived from wood which also retains water. It  is available as Citrucel. o Polyethylene Glycol - and artificial fiber commonly called Miralax or Glycolax.  It is helpful for people with gassy or bloated feelings with regular fiber o Flax Seed - a less gassy fiber than psyllium  No reading or other relaxing activity while on the toilet. If bowel movements take longer than 5 minutes, you are too constipated  AVOID CONSTIPATION.  High fiber and water intake usually takes care of this.  Sometimes a laxative is needed to stimulate more frequent bowel movements, but   Laxatives are not a good long-term solution as it can wear the colon out. o Osmotics (Milk of Magnesia, Fleets phosphosoda, Magnesium citrate, MiraLax, GoLytely) are safer than  o Stimulants (Senokot, Castor Oil, Dulcolax, Ex Lax)    o Do not take laxatives for more than 7days in a row.   IF SEVERELY CONSTIPATED, try a Bowel Retraining Program: o Do not use laxatives.  o Eat a diet high in roughage, such as bran cereals and leafy  vegetables.  o Drink six (6) ounces of prune or apricot juice each morning.  o Eat two (2) large servings of stewed fruit each day.  o Take one (1) heaping tablespoon of a psyllium-based bulking agent twice a day. Use sugar-free sweetener when possible to avoid excessive calories.  o Eat a normal breakfast.  o Set aside 15 minutes after breakfast to sit on the toilet, but do not strain to have a bowel movement.  o If you do not have a bowel movement by the third day, use an enema and repeat the above steps.   Controlling diarrhea  o Switch to liquids and simpler foods for a few days to avoid stressing your intestines further. o Avoid dairy products (especially milk & ice cream) for a short time.  The intestines often can lose the ability to digest lactose when stressed. o Avoid foods that cause gassiness or bloating.  Typical foods include beans and other legumes, cabbage, broccoli, and dairy foods.  Every person has some sensitivity to other foods, so listen to our body and avoid those foods that trigger problems for you. o Adding fiber (Citrucel, Metamucil, psyllium, Miralax) gradually can help thicken stools by absorbing excess fluid and retrain the intestines to act more normally.  Slowly increase the dose over a few weeks.  Too much fiber too soon can backfire and cause cramping & bloating. o Probiotics (such as active yogurt, Align, etc) may help repopulate the intestines and colon with normal bacteria and calm down a sensitive digestive tract.  Most studies show it to be of mild help, though, and such products can be costly. o Medicines: - Bismuth subsalicylate (ex. Kayopectate, Pepto Bismol) every 30 minutes for up to 6 doses can help control diarrhea.  Avoid if pregnant. - Loperamide (Immodium) can slow down diarrhea.  Start with two tablets (4mg  total) first and then try one tablet every 6 hours.  Avoid if you are having fevers or severe pain.  If you are not better or start feeling worse,  stop all medicines and call your doctor for advice o Call your doctor if you are getting worse or not better.  Sometimes further testing (cultures, endoscopy, X-ray studies, bloodwork, etc) may be needed to help diagnose and treat the cause of the diarrhea.   Enoxaparin injection Check his INR each day until he reaches an INR of 2.0 or higher He is to take 70 mg every 12 hours till his INR is 2.0 or  higher.  You can stop Enoxaparin  at that time.  CAll his clinic and let them help you if you have any questions What is this medicine? ENOXAPARIN (ee nox a PA rin) is used after knee, hip, or abdominal surgeries to prevent blood clotting. It is also used to treat existing blood clots in the lungs or in the veins. This medicine may be used for other purposes; ask your health care provider or pharmacist if you have questions. COMMON BRAND NAME(S): Lovenox What should I tell my health care provider before I take this medicine? They need to know if you have any of these conditions:  bleeding disorders, hemorrhage, or hemophilia  infection of the heart or heart valves  kidney or liver disease  previous stroke  prosthetic heart valve  recent surgery or delivery of a baby  ulcer in the stomach or intestine, diverticulitis, or other bowel disease  an unusual or allergic reaction to enoxaparin, heparin, pork or pork products, other medicines, foods, dyes, or preservatives  pregnant or trying to get pregnant  breast-feeding How should I use this medicine? This medicine is for injection under the skin. It is usually given by a health-care professional. You or a family member may be trained on how to give the injections. If you are to give yourself injections, make sure you understand how to use the syringe, measure the dose if necessary, and give the injection. To avoid bruising, do not rub the site where this medicine has been injected. Do not take your medicine more often than directed. Do not  stop taking except on the advice of your doctor or health care professional. Make sure you receive a puncture-resistant container to dispose of the needles and syringes once you have finished with them. Do not reuse these items. Return the container to your doctor or health care professional for proper disposal. Talk to your pediatrician regarding the use of this medicine in children. Special care may be needed. Overdosage: If you think you have taken too much of this medicine contact a poison control center or emergency room at once. NOTE: This medicine is only for you. Do not share this medicine with others. What if I miss a dose? If you miss a dose, take it as soon as you can. If it is almost time for your next dose, take only that dose. Do not take double or extra doses. What may interact with this medicine?  aspirin and aspirin-like medicines  certain medicines that treat or prevent blood clots  dipyridamole  NSAIDs, medicines for pain and inflammation, like ibuprofen or naproxen This list may not describe all possible interactions. Give your health care provider a list of all the medicines, herbs, non-prescription drugs, or dietary supplements you use. Also tell them if you smoke, drink alcohol, or use illegal drugs. Some items may interact with your medicine. What should I watch for while using this medicine? Visit your healthcare professional for regular checks on your progress. You may need blood work done while you are taking this medicine. Your condition will be monitored carefully while you are receiving this medicine. It is important not to miss any appointments. If you are going to need surgery or other procedure, tell your healthcare professional that you are using this medicine. Using this medicine for a long time may weaken your bones and increase the risk of bone fractures. Avoid sports and activities that might cause injury while you are using this medicine. Severe falls or  injuries can  cause unseen bleeding. Be careful when using sharp tools or knives. Consider using an Copy. Take special care brushing or flossing your teeth. Report any injuries, bruising, or red spots on the skin to your healthcare professional. Wear a medical ID bracelet or chain. Carry a card that describes your disease and details of your medicine and dosage times. What side effects may I notice from receiving this medicine? Side effects that you should report to your doctor or health care professional as soon as possible:  allergic reactions like skin rash, itching or hives, swelling of the face, lips, or tongue  bone pain  signs and symptoms of bleeding such as bloody or black, tarry stools; red or dark-brown urine; spitting up blood or brown material that looks like coffee grounds; red spots on the skin; unusual bruising or bleeding from the eye, gums, or nose  signs and symptoms of a blood clot such as chest pain; shortness of breath; pain, swelling, or warmth in the leg  signs and symptoms of a stroke such as changes in vision; confusion; trouble speaking or understanding; severe headaches; sudden numbness or weakness of the face, arm or leg; trouble walking; dizziness; loss of coordination Side effects that usually do not require medical attention (report to your doctor or health care professional if they continue or are bothersome):  hair loss  pain, redness, or irritation at site where injected This list may not describe all possible side effects. Call your doctor for medical advice about side effects. You may report side effects to FDA at 1-800-FDA-1088. Where should I keep my medicine? Keep out of the reach of children. Store at room temperature between 15 and 30 degrees C (59 and 86 degrees F). Do not freeze. If your injections have been specially prepared, you may need to store them in the refrigerator. Ask your pharmacist. Throw away any unused medicine after the  expiration date. NOTE: This sheet is a summary. It may not cover all possible information. If you have questions about this medicine, talk to your doctor, pharmacist, or health care provider.  2020 Elsevier/Gold Standard (2017-01-09 11:25:34)

## 2019-02-25 NOTE — TOC Progression Note (Signed)
Transition of Care [facility] - Progression Note    Patient Details  Name: [name] MRN: [id number] Date of Birth: Apr 14, 1980  [facility]) CM/SW Contact  [name] Edson Snowball, RN Phone Number: 02/25/2019, 8:52 AM  Clinical Narrative:      [name] to caregiver [name], discussed Lovenox injections. [name] feels comfortable being taught how to administer Lovenox and doing injections at home.  [name] is planning on visiting [name] this morning at 10 am and is willing to be taught injections.  [name] does not want ambulance transportation home for [name] at discharge. He will arrange transport van.   Expected Discharge Plan: Home/Self Care Barriers to Discharge: Continued Medical Work up  Expected Discharge Plan and Services Expected Discharge Plan: Home/Self Care   Discharge Planning Services: CM Consult   Living arrangements for the past 2 months: Apartment                 DME Arranged: Other see comment(hoyer)         HH Arranged: NA           Social Determinants of Health (SDOH) Interventions    Readmission Risk Interventions No flowsheet data found.

## 2019-03-12 ENCOUNTER — Other Ambulatory Visit: Payer: Self-pay | Admitting: Neurology

## 2019-05-10 ENCOUNTER — Emergency Department (HOSPITAL_COMMUNITY): Payer: Medicare Other

## 2019-05-10 ENCOUNTER — Emergency Department (HOSPITAL_COMMUNITY)
Admission: EM | Admit: 2019-05-10 | Discharge: 2019-05-11 | Disposition: A | Discharge status: Home / Self Care | Payer: Medicare Other | Attending: Emergency Medicine | Admitting: Emergency Medicine

## 2019-05-10 ENCOUNTER — Encounter (HOSPITAL_COMMUNITY): Payer: Self-pay | Admitting: Emergency Medicine

## 2019-05-10 ENCOUNTER — Other Ambulatory Visit: Payer: Self-pay

## 2019-05-10 DIAGNOSIS — Z7901 Long term (current) use of anticoagulants: Secondary | ICD-10-CM | POA: Insufficient documentation

## 2019-05-10 DIAGNOSIS — N200 Calculus of kidney: Secondary | ICD-10-CM | POA: Insufficient documentation

## 2019-05-10 DIAGNOSIS — Z87440 Personal history of urinary (tract) infections: Secondary | ICD-10-CM | POA: Insufficient documentation

## 2019-05-10 DIAGNOSIS — N3001 Acute cystitis with hematuria: Secondary | ICD-10-CM | POA: Insufficient documentation

## 2019-05-10 DIAGNOSIS — I10 Essential (primary) hypertension: Secondary | ICD-10-CM | POA: Insufficient documentation

## 2019-05-10 DIAGNOSIS — R109 Unspecified abdominal pain: Secondary | ICD-10-CM | POA: Diagnosis present

## 2019-05-10 LAB — CBC WITH DIFFERENTIAL/PLATELET
Abs Immature Granulocytes: 0.05 10*3/uL (ref 0.00–0.07)
Basophils Absolute: 0 10*3/uL (ref 0.0–0.1)
Basophils Relative: 0 %
Eosinophils Absolute: 0 10*3/uL (ref 0.0–0.5)
Eosinophils Relative: 0 %
HCT: 49.1 % (ref 39.0–52.0)
Hemoglobin: 15.7 g/dL (ref 13.0–17.0)
Immature Granulocytes: 0 %
Lymphocytes Relative: 6 %
Lymphs Abs: 0.8 10*3/uL (ref 0.7–4.0)
MCH: 29.8 pg (ref 26.0–34.0)
MCHC: 32 g/dL (ref 30.0–36.0)
MCV: 93.2 fL (ref 80.0–100.0)
Monocytes Absolute: 0.6 10*3/uL (ref 0.1–1.0)
Monocytes Relative: 5 %
Neutro Abs: 10.6 10*3/uL — ABNORMAL HIGH (ref 1.7–7.7)
Neutrophils Relative %: 89 %
Platelets: 240 10*3/uL (ref 150–400)
RBC: 5.27 MIL/uL (ref 4.22–5.81)
RDW: 13.6 % (ref 11.5–15.5)
WBC: 12.1 10*3/uL — ABNORMAL HIGH (ref 4.0–10.5)
nRBC: 0 % (ref 0.0–0.2)

## 2019-05-10 LAB — COMPREHENSIVE METABOLIC PANEL
ALT: 18 U/L (ref 0–44)
AST: 24 U/L (ref 15–41)
Albumin: 4.1 g/dL (ref 3.5–5.0)
Alkaline Phosphatase: 50 U/L (ref 38–126)
Anion gap: 11 (ref 5–15)
BUN: 10 mg/dL (ref 6–20)
CO2: 27 mmol/L (ref 22–32)
Calcium: 9.3 mg/dL (ref 8.9–10.3)
Chloride: 103 mmol/L (ref 98–111)
Creatinine, Ser: 0.89 mg/dL (ref 0.61–1.24)
GFR calc Af Amer: >60 mL/min (ref >60)
GFR calc non Af Amer: >60 mL/min (ref >60)
Glucose, Bld: 105 mg/dL — ABNORMAL HIGH (ref 70–99)
Potassium: 3.7 mmol/L (ref 3.5–5.1)
Sodium: 141 mmol/L (ref 135–145)
Total Bilirubin: 1.2 mg/dL (ref 0.3–1.2)
Total Protein: 7 g/dL (ref 6.5–8.1)

## 2019-05-10 LAB — URINALYSIS, ROUTINE W REFLEX MICROSCOPIC
Bilirubin Urine: NEGATIVE
Glucose, UA: NEGATIVE mg/dL
Ketones, ur: 5 mg/dL — AB
Leukocytes,Ua: NEGATIVE
Nitrite: POSITIVE — AB
Protein, ur: 30 mg/dL — AB
Specific Gravity, Urine: 1.02 (ref 1.005–1.030)
pH: 6 (ref 5.0–8.0)

## 2019-05-10 LAB — LIPASE, BLOOD: Lipase: 19 U/L (ref 11–51)

## 2019-05-10 MED ORDER — ONDANSETRON HCL 4 MG/2ML IJ SOLN
4.0000 mg | Freq: Once | INTRAMUSCULAR | Status: AC
  Administered 2019-05-10: 20:00:00 4 mg via INTRAVENOUS
  Filled 2019-05-10: qty 2

## 2019-05-10 MED ORDER — TAMSULOSIN HCL 0.4 MG PO CAPS: 0.4000 mg | ORAL_CAPSULE | Freq: Every day | ORAL | 0 refills | Status: DC

## 2019-05-10 MED ORDER — SODIUM CHLORIDE (PF) 0.9 % IJ SOLN
INTRAMUSCULAR | Status: AC
  Filled 2019-05-10: qty 50

## 2019-05-10 MED ORDER — CEPHALEXIN 500 MG PO CAPS: 1000.0000 mg | ORAL_CAPSULE | Freq: Two times a day (BID) | ORAL | 0 refills | Status: DC

## 2019-05-10 MED ORDER — SODIUM CHLORIDE 0.9 % IV BOLUS
1000.0000 mL | Freq: Once | INTRAVENOUS | Status: AC
  Administered 2019-05-10: 1000 mL via INTRAVENOUS

## 2019-05-10 MED ORDER — HYDROMORPHONE HCL 1 MG/ML IJ SOLN
1.0000 mg | Freq: Once | INTRAMUSCULAR | Status: AC
  Administered 2019-05-10: 22:00:00 1 mg via INTRAVENOUS
  Filled 2019-05-10: qty 1

## 2019-05-10 MED ORDER — IOHEXOL 300 MG/ML  SOLN
100.0000 mL | Freq: Once | INTRAMUSCULAR | Status: AC | PRN
  Administered 2019-05-10: 21:00:00 100 mL via INTRAVENOUS

## 2019-05-10 MED ORDER — SODIUM CHLORIDE 0.9 % IV SOLN
1.0000 g | Freq: Once | INTRAVENOUS | Status: AC
  Administered 2019-05-10: 22:00:00 1 g via INTRAVENOUS
  Filled 2019-05-10: qty 10

## 2019-05-10 MED ORDER — ONDANSETRON 4 MG PO TBDP: 4.0000 mg | ORAL_TABLET | Freq: Three times a day (TID) | ORAL | 0 refills | Status: AC | PRN

## 2019-05-10 MED ORDER — HYDROCODONE-ACETAMINOPHEN 5-325 MG PO TABS: 1.0000 | ORAL_TABLET | Freq: Four times a day (QID) | ORAL | 0 refills | Status: DC | PRN

## 2019-05-10 NOTE — ED Notes (Signed)
Pt given a soft touch call bell to assist with pt needs when staff is not at bedside.

## 2019-05-10 NOTE — ED Provider Notes (Addendum)
[facility] DEPT Provider Note   CSN: [id number] Arrival date & time: 05/10/19  1651     History Chief Complaint  Patient presents with  . Abdominal Pain    [name] is a 39 y.o. male.  With a history of cerebral palsy, chronic constipation, hypertension, prior small bowel obstructions, prior DVT, and prior laparotomy who presents to the emergency department with complaints of abdominal pain that began this morning.  Patient states pain is generalized, constant, no alleviating aggravating factors.  He reports some nausea without episodes of emesis.  Last bowel movement was yesterday, not sure if he has passed gas today. Has had some mild dysuria. Denies fever, chills, emesis, or melena.  HPI     Past Medical History:  Diagnosis Date  . Cerebral palsy ([place])   . Chronic constipation   . Coagulopathy ([place])   . DVT (deep venous thrombosis) ([place])   . Hypertension   . Insomnia   . Recurrent UTI   . Tachycardia     Patient Active Problem List   Diagnosis Date Noted  . SBO (small bowel obstruction) ([place]) 02/16/2019  . Aspiration pneumonia of both lower lobes due to gastric secretions ([place]) 02/15/2018  . Sacral decubitus ulcer, stage II ([place]) 02/15/2018  . Community acquired pneumonia 02/14/2018  . Ileus ([place]) 02/14/2018  . Tachycardia, unspecified 02/14/2018  . Pressure injury of skin 12/24/2017  . Encephalopathy acute   . Anticoagulated 12/15/2017  . Respiratory failure ([place])   . Lactic acidosis 12/14/2017  . Wheelchair bound 07/22/2017  . Cellulitis of left leg 05/13/2015  . Sepsis ([place]) 05/13/2015  . History of DVT (deep vein thrombosis) 05/13/2015  . Neurogenic bladder 05/13/2015  . Spasticity 12/02/2013  . Small bowel obstruction ([place]) 06/05/2011  . Acute renal failure ([place]) 06/05/2011  . Chronic constipation 06/05/2011  . Hypertension 06/05/2011  . Hypokalemia 06/05/2011  . Low TSH level 06/05/2011  . Groin mass 06/05/2011    . Loose stools 11/15/2010  . Bacteremia 10/16/2010  . MSSA (methicillin susceptible Staphylococcus aureus) septicemia ([place]) 10/16/2010  . Abscess of arm, left 10/16/2010  . Constipation 11/29/2009  . SHOULDR&UPPR ARM ABRASION/FRICTION BURN INFECTED 01/16/2007  . SORE THROAT 10/30/2006  . Atrial fibrillation ([place]) 08/27/2006  . ANEMIA, PERNICIOUS 07/25/2006  . Infantile cerebral palsy ([place]) 07/25/2006    Past Surgical History:  Procedure Laterality Date  . ELBOW SURGERY  09/12/10   left  . INCISE AND DRAIN ABCESS  09/11/10  . LAPAROTOMY N/A 12/15/2017   Procedure: EXPLORATORY LAPAROTOMY;  Surgeon: Kieth Brightly Arta Bruce, MD;  Location: [facility];  Service: General;  Laterality: N/A;  . SMALL INTESTINE SURGERY    . TOOTH EXTRACTION  11/22/2011   Procedure: DENTAL RESTORATION/EXTRACTIONS;  Surgeon: Marcelo Baldy, MD;  Location: [facility];  Service: Dentistry;  Laterality: N/A;  dental restoration and cleaning; NO EXTRACTIONS       Family History  Family history unknown: Yes    Social History   Tobacco Use  . Smoking status: Never Smoker  . Smokeless tobacco: Never Used  Substance Use Topics  . Alcohol use: No  . Drug use: No    Home Medications Prior to Admission medications   Medication Sig Start Date End Date Taking? Authorizing Provider  acetaminophen (TYLENOL) 325 MG tablet Take 650 mg by mouth every 6 (six) hours as needed. For pain    [provider]  baclofen (LIORESAL) 20 MG tablet Take 1 tablet (20 mg total) by mouth 4 (four) times  daily. 07/01/18   Suzzanne Cloud, NP  calcium carbonate (TUMS - DOSED IN MG ELEMENTAL CALCIUM) 500 MG chewable tablet Chew 1 tablet by mouth 3 (three) times daily.    [provider]  cetirizine (ZYRTEC) 10 MG tablet Take 10 mg by mouth daily.    [provider]  cholecalciferol (VITAMIN D) 1000 UNITS tablet Take 1,000 Units by mouth 2 (two) times daily.    [provider]  cyclobenzaprine (FLEXERIL) 10 MG tablet  1 at 8am and 12pm and 2 tabs at 8pm. Patient taking differently: Take 10-20 mg by mouth See admin instructions. 1 tablet (10mg ) at 8am and 12pm and 2 tabs (20mg ) at 8pm. 07/01/18   Suzzanne Cloud, NP  dextromethorphan (DELSYM) 30 MG/5ML liquid Take 30 mg by mouth as needed for cough.    [provider]  diazepam (VALIUM) 2 MG tablet Take 1 tablet (2 mg total) by mouth 2 (two) times daily. Must last 30 days. 07/01/18   Suzzanne Cloud, NP  diphenhydrAMINE (BENADRYL) 25 MG tablet Take 25 mg by mouth every 6 (six) hours as needed for allergies.    [provider]  enoxaparin (LOVENOX) 80 MG/0.8ML injection He needs 70 mg of Lovenox every 12 hours until his INR is 2.0 or higher. 02/25/19   Earnstine Regal, PA-C  fluticasone (VERAMYST) 27.5 MCG/SPRAY nasal spray Place 1 spray into the nose at bedtime as needed for rhinitis.    [provider]  furosemide (LASIX) 20 MG tablet Take 20 mg by mouth daily as needed for fluid (leg edema).    [provider]  glycerin adult 2 g suppository Place 1 suppository rectally as needed for constipation.    [provider]  hydrochlorothiazide (HYDRODIURIL) 25 MG tablet Take 12.5 mg by mouth daily.     [provider]  loperamide (IMODIUM) 2 MG capsule Take 2 mg by mouth 3 (three) times daily as needed for diarrhea or loose stools. Reported on 05/11/2015    [provider]  metoprolol succinate (TOPROL-XL) 50 MG 24 hr tablet Take 1 tablet (50 mg total) by mouth daily. 02/26/19   Earnstine Regal, PA-C  mineral oil enema Place 1 enema rectally as needed for severe constipation.    [provider]  Multiple Vitamin (MULITIVITAMIN WITH MINERALS) TABS Take 1 tablet by mouth daily.    [provider]  nitrofurantoin (MACRODANTIN) 50 MG capsule Take 50 mg by mouth daily.    [provider]  ondansetron (ZOFRAN-ODT) 4 MG disintegrating tablet Take 4 mg by mouth every 8 (eight) hours as needed  for nausea or vomiting.    [provider]  Polyethylene Glycol 3350 (PEG 3350) POWD Take 17 g by mouth daily as needed for constipation.    [provider]  psyllium (HYDROCIL/METAMUCIL) 95 % PACK Give him one dose daily as directed on the package.  This is to help with his constipation.  You can buy this over the counter at any drug store. 02/25/19   Earnstine Regal, PA-C  senna-docusate (SENOKOT-S) 8.6-50 MG tablet Take 3 tablets by mouth at bedtime.    [provider]  Stannous Fluoride (GEL-KAM) 0.4 % GEL Place 1 application onto teeth daily.    [provider]  tiZANidine (ZANAFLEX) 2 MG tablet TAKE 1 TABLET BY MOUTH TWICE A DAY;TAKE 2 TABLETS (4MG ) BY MOUTH AT 12PM 03/16/19   Suzzanne Cloud, NP  traMADol (ULTRAM) 50 MG tablet Take 100 mg by mouth every 6 (  six) hours as needed for moderate pain.    [provider]  warfarin (COUMADIN) 3 MG tablet Take 3 mg by mouth daily at 6 PM.     [provider]    Allergies    Betadine [povidone iodine], Ciprofloxacin, Fentanyl, and Morphine  Review of Systems   Review of Systems  Constitutional: Negative for chills and fever.  Respiratory: Negative for shortness of breath.   Cardiovascular: Negative for chest pain.  Gastrointestinal: Positive for abdominal pain and nausea. Negative for blood in stool, diarrhea and vomiting.  Genitourinary: Positive for dysuria.  Neurological: Negative for syncope.  All other systems reviewed and are negative.  Physical Exam Updated Vital Signs BP 114/72 (BP Location: Left Leg)   Pulse 75   Temp 98.8 F (37.1 C) (Rectal)   Resp (!) 25   SpO2 100%   Physical Exam Vitals and nursing note reviewed.  Constitutional:      General: He is not in acute distress.    Appearance: He is well-developed. He is not toxic-appearing.  HENT:     Head: Normocephalic and atraumatic.  Eyes:     General:        Right eye: No discharge.        Left eye: No discharge.      Conjunctiva/sclera: Conjunctivae normal.  Cardiovascular:     Rate and Rhythm: Normal rate and regular rhythm.  Pulmonary:     Effort: Pulmonary effort is normal. No respiratory distress.     Breath sounds: Normal breath sounds. No wheezing, rhonchi or rales.  Abdominal:     General: Bowel sounds are decreased. There is no distension.     Palpations: Abdomen is soft.     Tenderness: There is generalized abdominal tenderness. There is no guarding or rebound.  Musculoskeletal:     Cervical back: Neck supple.  Skin:    General: Skin is warm and dry.     Findings: No rash.  Neurological:     Mental Status: He is alert.     Comments: Contractures noted.  Psychiatric:        Behavior: Behavior normal.    ED Results / Procedures / Treatments   Labs (all labs ordered are listed, but only abnormal results are displayed) Labs Reviewed  URINALYSIS, ROUTINE W REFLEX MICROSCOPIC - Abnormal; Notable for the following components:      Result Value   Color, Urine AMBER (*)    APPearance HAZY (*)    Hgb urine dipstick LARGE (*)    Ketones, ur 5 (*)    Protein, ur 30 (*)    Nitrite POSITIVE (*)    Bacteria, UA FEW (*)    All other components within normal limits  CBC WITH DIFFERENTIAL/PLATELET - Abnormal; Notable for the following components:   WBC 12.1 (*)    Neutro Abs 10.6 (*)    All other components within normal limits  COMPREHENSIVE METABOLIC PANEL - Abnormal; Notable for the following components:   Glucose, Bld 105 (*)    All other components within normal limits  URINE CULTURE  LIPASE, BLOOD    EKG None  Radiology CT Abdomen Pelvis W Contrast  Result Date: 05/10/2019 CLINICAL DATA:  Left-sided abdominal pain EXAM: CT ABDOMEN AND PELVIS WITH CONTRAST TECHNIQUE: Multidetector CT imaging of the abdomen and pelvis was performed using the standard protocol following bolus administration of intravenous contrast. CONTRAST:  151mL OMNIPAQUE IOHEXOL 300 MG/ML  SOLN COMPARISON:   None. FINDINGS: Lower chest: No acute abnormality.  Hepatobiliary: Multiple gallstones are noted within the gallbladder. No acute complicating factors are seen. The liver is within normal limits. Pancreas: Unremarkable. No pancreatic ductal dilatation or surrounding inflammatory changes. Spleen: Normal in size without focal abnormality. Adrenals/Urinary Tract: Adrenal glands are unremarkable. Kidneys demonstrate small cysts bilaterally. No renal calculi are identified. Right ureter and collecting system are within normal limits. The bladder is decompressed. There is fullness of the left renal pelvis and ureter which extends inferiorly to just above the UVJ at which point a small 2-3 mm stone is noted. This is best visualized on image number 87 of series 2. Stomach/Bowel: No obstructive or inflammatory changes of the colon are seen. Postsurgical changes in the right colon are noted. The appendix appears of been surgically removed. No small bowel obstructive changes are seen. Stomach is unremarkable. Vascular/Lymphatic: No significant vascular findings are present. No enlarged abdominal or pelvic lymph nodes. Reproductive: Prostate is unremarkable. Other: No abdominal wall hernia or abnormality. No abdominopelvic ascites. Musculoskeletal: Degenerative changes of lumbar spine are seen. IMPRESSION: 2-3 mm stone in the distal left ureter with obstructive change. Cholelithiasis without complicating factors. Stable right colonic surgery.  No obstructive changes are seen. Electronically Signed   By: Inez Catalina M.D.   On: 05/10/2019 21:23    Procedures Procedures (including critical care time)  Medications Ordered in ED Medications  sodium chloride (PF) 0.9 % injection (has no administration in time range)  sodium chloride 0.9 % bolus 1,000 mL (0 mLs Intravenous Stopped 05/10/19 2101)  ondansetron (ZOFRAN) injection 4 mg (4 mg Intravenous Given 05/10/19 1936)  iohexol (OMNIPAQUE) 300 MG/ML solution 100 mL (100 mLs  Intravenous Contrast Given 05/10/19 2055)  cefTRIAXone (ROCEPHIN) 1 g in sodium chloride 0.9 % 100 mL IVPB (0 g Intravenous Stopped 05/10/19 2256)  HYDROmorphone (DILAUDID) injection 1 mg (1 mg Intravenous Given 05/10/19 2154)  sodium chloride 0.9 % bolus 1,000 mL (1,000 mLs Intravenous New Bag/Given 05/10/19 2159)    ED Course  I have reviewed the triage vital signs and the nursing notes.  Pertinent labs & imaging results that were available during my care of the patient were reviewed by me and considered in my medical decision making (see chart for details).    MDM Rules/Calculators/A&P                     Patient presents to the ED with complaints of abdominal pain.  Patient is nontoxic, resting comfortably, vitals without significant abnormality.  On exam he has generalized abdominal tenderness to palpation, decreased bowel sounds, no significant distention noted.  Differential diagnosis includes: Small bowel obstruction, perforation, appendicitis, cholecystitis, pancreatitis, nephrolithiasis, pyelonephritis.  Additional history obtained:  Additional history obtained from patient's caregiver [facility] via telephone. Previous records obtained and reviewed  Lab Tests:  I Ordered, reviewed, and interpreted labs, which included:  CBC: Mild leukocytosis.  No anemia. CMP: Renal function preserved.  No significant electrolyte derangement.  LFTs WNL. Lipase: WNL. Urinalysis: Concerning for infection, nitrite positive.  Culture sent.  Imaging Studies ordered:  I ordered imaging studies which included CT A/P, I independently visualized and interpreted imaging which showed  2-3 mm stone in the distal left ureter with obstructive change. Cholelithiasis without complicating factors. Stable right colonic surgery.  No obstructive changes are seen.  Medicines ordered/re-evaluations/consults:  We ordered fluids, analgesics, antiemetics, and antibiotics. Ureteral stone with obstructive changes and  infected urine will discuss with urology service.  Supervising physician Dr. Vallery Ridge discussed with urologist Dr. Juliann Pares discharge  home with antibiotics, Flomax, and analgesics pending patient is pain control, she has reviewed imaging.  Appreciate consultation. Further discussed with Dr. Johnney Killian- recommends keflex 1000 mg BID which I am in agreement with. Patient feeling much better, he is tolerating PO, states he is ready to go home, he is nontoxic appearing. I discussed results, treatment plan, need for follow-up, and return precautions with the patient & his caregiver Mr [facility] via telephone (confirmed pharmacy for meds with Mr. Humphrey) Provided opportunity for questions, patient & his caregiver confirmed understanding and are in agreement with plan.   Findings and plan of care discussed with supervising physician Dr. Johnney Killian who is in agreement.   Portions of this note were generated with [employer]obby[employer]. Dictation errors may occur despite best attempts at proofreading.  Final Clinical Impression(s) / ED Diagnoses Final diagnoses:  Acute cystitis with hematuria  Nephrolithiasis    Rx / DC Orders ED Discharge Orders         Ordered    HYDROcodone-acetaminophen (NORCO/VICODIN) 5-325 MG tablet  Every 6 hours PRN     05/10/19 2322    ondansetron (ZOFRAN ODT) 4 MG disintegrating tablet  Every 8 hours PRN     05/10/19 2322    tamsulosin (FLOMAX) 0.4 MG CAPS capsule  Daily after supper     05/10/19 2322    cephALEXin (KEFLEX) 500 MG capsule  2 times daily     05/10/19 2322           Jani Moronta, [place] R, PA-C 05/10/19 2335    Amaryllis Dyke, PA-C 05/10/19 2335    Charlesetta Shanks, MD 05/17/19 [phone]

## 2019-05-10 NOTE — Discharge Instructions (Addendum)
You were seen in the emergency department and found to have a kidney stone and a urinary tract infection.  We are sending you home with multiple medications to assist with passing the stone and treating the infection:   - Keflex- this is an antibiotic to treat infection, take this twice per day for the next 10 days- if this antibiotic not be effective for your urine culture which we are testing here over the next couple days we will call you and change the antibiotic.  -Flomax-this is a medication to help pass the stone, it allows urine to exit the body more freely.  Please take this once daily with a meal.  -Norco-this is a narcotic/controlled substance medication that has potential addicting qualities.  We recommend that you take 1-2 tablets every 6 hours as needed for severe pain.  Do not drive or operate heavy machinery when taking this medicine as it can be sedating. Do not drink alcohol or take other sedating medications when taking this medicine for safety reasons.  Keep this out of reach of small children.  Please be aware this medicine has Tylenol in it (325 mg/tab) do not exceed the maximum dose of Tylenol in a day per over the counter recommendations should you decide to supplement with Tylenol over the counter.   -Zofran-this is an antinausea medication, you may take this every 8 hours as needed for nausea and vomiting, please allow the tablet to dissolve underneath of your tongue.   We have prescribed you new medication(s) today. Discuss the medications prescribed today with your pharmacist as they can have adverse effects and interactions with your other medicines including over the counter and prescribed medications. Seek medical evaluation if you start to experience new or abnormal symptoms after taking one of these medicines, seek care immediately if you start to experience difficulty breathing, feeling of your throat closing, facial swelling, or rash as these could be indications of a  more serious allergic reaction  Please follow-up with the urology group provided in your discharge instructions within 3 to 5 days.  Return to the ER for new or worsening symptoms including but not limited to worsening pain not controlled by these medicines, inability to keep fluids down, fever, or any other concerns that you may have.

## 2019-05-10 NOTE — ED Triage Notes (Signed)
The patient is from home and presents with decreased appetite and left abdominal pain. He had a bowel movement yesterday. EMS noted abdominal distention. HX. Bowel obstruction  Caregiver: Pricilla Holm [phone]    EMS vitals: 124 palpated BP 88 HR 100% O2 sat on room air

## 2019-05-10 NOTE — ED Notes (Signed)
Pt repositioned in bed and turned onto his right side at this time.

## 2019-05-11 NOTE — ED Notes (Signed)
Patient departed stable with Ptar by stretcher at this time, unable to E-Sign

## 2019-05-11 NOTE — ED Notes (Signed)
Pt given coke to drink with NT assistance.

## 2019-05-13 LAB — URINE CULTURE

## 2019-05-27 ENCOUNTER — Ambulatory Visit (INDEPENDENT_AMBULATORY_CARE_PROVIDER_SITE_OTHER): Payer: Medicare Other | Admitting: Neurology

## 2019-05-27 ENCOUNTER — Encounter: Payer: Self-pay | Admitting: Neurology

## 2019-05-27 ENCOUNTER — Other Ambulatory Visit: Payer: Self-pay

## 2019-05-27 VITALS — BP 105/72 | HR 76 | Temp 98.3°F

## 2019-05-27 DIAGNOSIS — G809 Cerebral palsy, unspecified: Secondary | ICD-10-CM | POA: Diagnosis not present

## 2019-05-27 DIAGNOSIS — R252 Cramp and spasm: Secondary | ICD-10-CM

## 2019-05-27 MED ORDER — DIAZEPAM 2 MG PO TABS: 2.0000 mg | ORAL_TABLET | Freq: Two times a day (BID) | ORAL | 5 refills | Status: AC

## 2019-05-27 MED ORDER — BACLOFEN 20 MG PO TABS: 20.0000 mg | ORAL_TABLET | Freq: Four times a day (QID) | ORAL | 4 refills | Status: AC

## 2019-05-27 MED ORDER — TIZANIDINE HCL 2 MG PO TABS: ORAL_TABLET | ORAL | 4 refills | Status: DC

## 2019-05-27 MED ORDER — CYCLOBENZAPRINE HCL 10 MG PO TABS: ORAL_TABLET | ORAL | 4 refills | Status: DC

## 2019-05-27 NOTE — Progress Notes (Signed)
PATIENT: [name] DOB: Apr 05, 1980  REASON FOR VISIT: follow up HISTORY FROM: patient  HISTORY OF PRESENT ILLNESS: Today 05/27/19  HISTORY  HISTORY OF PRESENT ILLNESS:[name] is a 39 years old right-handed male, accompanied by his caregiver Daleen Snook for evaluation of spasticity. He was born with cerebral palsy, has been wheelchair-bound, now lives alone, with his caregiver lives in with him, his father, stepmother, grandmother, is actively involved in his care. 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 He suffered bilateral lower extremity DVT in 2000, and 2005,on chronic Coumadin treatment, Over the years, his spasticity has been controlled by medications, baclofen 20 mg 4 times a day, Valium 2 mg twice a day, Flexeril 10 mg 3 times a day, but a spasticity has gradually worsened over time, sometimes it was so forceful, he ripped the arm rest  UPDATE May 19 2014:YYHe is with his caregiver daily, he still works full time, he likes current medication changes, baclofen 20 mg 4 times a day, Valium 2 mg twice a day, Flexeril 10 mg 3 times a day, I have added on tizanidine 2 mg 3 times a day, works well,  UPDATE 12/06/14:Michael returns with his caregiver. He is having more spasticity first thing in the morning and before bed. He is currently on baclofen 20 mg, 4 times a day, Flexeril 10 mg 3 times a day, Valium 2 mg twice daily and tizanidine 2 mg 3 times daily. He returns for reevaluation  UPDATE May 27 2019 He was followed by nurse  practitioner on the past few years, overall he is stable, has lived with his current caregiver [name] since 2016, he used modified wheelchair, able to control his wheelchair with headset, he only has limited movement of bilateral upper and lower extremity, severe spasticity, on polypharmacy treatment, with stable dose, he only need refill at today's visit, he has been follow-up with his primary care physician regularly.  REVIEW OF SYSTEMS: Out of a complete 14 system review of symptoms, the patient complains only of the following symptoms, and all other reviewed systems are negative.  Muscle spasms  ALLERGIES: Allergies  Allergen Reactions  . Betadine [Povidone Iodine] Other (See Comments)    Unknown reaction per caregiver  . Ciprofloxacin Other (See Comments)    Unknown reaction per caregiver  . Fentanyl Other (See Comments)    Previous reaction resulting in intubation, unclear details per family  . Morphine Other (See Comments)    Unknown reaction per caregiver    HOME MEDICATIONS: Outpatient Medications Prior to Visit  Medication Sig Dispense Refill  . acetaminophen (TYLENOL) 325 MG tablet Take 650 mg by mouth every 6 (six) hours as needed. For pain    . baclofen (LIORESAL) 20 MG tablet Take 1 tablet (20 mg total) by mouth 4 (four) times daily. 360 each 3  . calcium carbonate (TUMS - DOSED IN MG ELEMENTAL CALCIUM) 500 MG chewable tablet Chew 1 tablet by mouth 3 (three) times daily.    . cetirizine (ZYRTEC) 10 MG tablet Take 10 mg by mouth daily.    . cholecalciferol (VITAMIN D) 1000 UNITS tablet Take  1,000 Units by mouth 2 (two) times daily.    . cyclobenzaprine (FLEXERIL) 10 MG tablet 1 at 8am and 12pm and 2 tabs at 8pm. (Patient taking differently: Take 10-20 mg by mouth See admin instructions. 1 tablet (10mg ) at 8am and 12pm and 2 tabs (20mg ) at 8pm.) 360 tablet 3  . dextromethorphan (DELSYM) 30 MG/5ML liquid Take 30 mg by mouth as needed for cough.    . diazepam (VALIUM) 2 MG  tablet Take 1 tablet (2 mg total) by mouth 2 (two) times daily. Must last 30 days. 60 tablet 5  . diphenhydrAMINE (BENADRYL) 25 MG tablet Take 25 mg by mouth every 6 (six) hours as needed for allergies.    . fluticasone (VERAMYST) 27.5 MCG/SPRAY nasal spray Place 1 spray into the nose at bedtime as needed for rhinitis.    . furosemide (LASIX) 20 MG tablet Take 20 mg by mouth daily as needed for fluid (leg edema).    Marland Kitchen glycerin adult 2 g suppository Place 1 suppository rectally as needed for constipation.    . hydrochlorothiazide (HYDRODIURIL) 25 MG tablet Take 12.5 mg by mouth daily.     Marland Kitchen HYDROcodone-acetaminophen (NORCO/VICODIN) 5-325 MG tablet Take 1-2 tablets by mouth every 6 (six) hours as needed. 10 tablet 0  . loperamide (IMODIUM) 2 MG capsule Take 2 mg by mouth 3 (three) times daily as needed for diarrhea or loose stools. Reported on 05/11/2015    . metoprolol succinate (TOPROL-XL) 50 MG 24 hr tablet Take 1 tablet (50 mg total) by mouth daily. (Patient taking differently: Take 100 mg by mouth daily. ) 30 tablet 0  . mineral oil enema Place 1 enema rectally as needed for severe constipation.    . Multiple Vitamin (MULITIVITAMIN WITH MINERALS) TABS Take 1 tablet by mouth daily.    . nitrofurantoin (MACRODANTIN) 50 MG capsule Take 50 mg by mouth daily.    . ondansetron (ZOFRAN ODT) 4 MG disintegrating tablet Take 1 tablet (4 mg total) by mouth every 8 (eight) hours as needed for nausea or vomiting. 5 tablet 0  . Polyethylene Glycol 3350 (PEG 3350) POWD Take 17 g by mouth daily as needed for constipation.    . psyllium (HYDROCIL/METAMUCIL) 95 % PACK Give him one dose daily as directed on the package.  This is to help with his constipation.  You can buy this over the counter at any drug store. 240 each 0  . senna-docusate (SENOKOT-S) 8.6-50 MG tablet Take 3 tablets by mouth at bedtime.    . Stannous Fluoride (GEL-KAM) 0.4 % GEL Place 1 application onto teeth daily.    Marland Kitchen tiZANidine (ZANAFLEX) 2 MG  tablet TAKE 1 TABLET BY MOUTH TWICE A DAY;TAKE 2 TABLETS (4MG ) BY MOUTH AT 12PM (Patient taking differently: Take 2-4 mg by mouth in the morning, at noon, and at bedtime. 2mg  8am 4mg  12pm 2mg  hs) 120 tablet 11  . traMADol (ULTRAM) 50 MG tablet Take 100 mg by mouth every 6 (six) hours as needed for moderate pain.    Marland Kitchen warfarin (COUMADIN) 3 MG tablet Take 3 mg by mouth daily at 6 PM.     . cephALEXin (KEFLEX) 500 MG capsule Take 2 capsules (1,000 mg total) by mouth 2 (two) times daily. 20 capsule 0  . tamsulosin (FLOMAX) 0.4 MG CAPS capsule Take 1 capsule (0.4 mg total) by mouth daily after supper. 15 capsule 0   No facility-administered medications prior to visit.    PAST MEDICAL HISTORY: Past Medical History:  Diagnosis  Date  . Cerebral palsy ([place])   . Chronic constipation   . Coagulopathy ([place])   . DVT (deep venous thrombosis) ([place])   . Hypertension   . Insomnia   . Recurrent UTI   . Tachycardia     PAST SURGICAL HISTORY: Past Surgical History:  Procedure Laterality Date  . ELBOW SURGERY  09/12/10   left  . INCISE AND DRAIN ABCESS  09/11/10  . LAPAROTOMY N/A 12/15/2017   Procedure: EXPLORATORY LAPAROTOMY;  Surgeon: Kieth Brightly Arta Bruce, MD;  Location: [facility];  Service: General;  Laterality: N/A;  . SMALL INTESTINE SURGERY    . TOOTH EXTRACTION  11/22/2011   Procedure: DENTAL RESTORATION/EXTRACTIONS;  Surgeon: Marcelo Baldy, MD;  Location: [facility];  Service: Dentistry;  Laterality: N/A;  dental restoration and cleaning; NO EXTRACTIONS    FAMILY HISTORY: Family History  Family history unknown: Yes    SOCIAL HISTORY: Social History   Socioeconomic History  . Marital status: Single    Spouse name: Not on file  . Number of children: Not on file  . Years of education: Not on file  . Highest education level: Not on file  Occupational History  . Not on file  Tobacco Use  . Smoking status: Never Smoker  . Smokeless tobacco: Never Used  Substance and Sexual Activity  .  Alcohol use: No  . Drug use: No  . Sexual activity: Never  Other Topics Concern  . Not on file  Social History Narrative  . Not on file   Social Determinants of Health   Financial Resource Strain:   . Difficulty of Paying Living Expenses:   Food Insecurity:   . Worried About Charity fundraiser in the Last Year:   . Arboriculturist in the Last Year:   Transportation Needs:   . Film/video editor (Medical):   Marland Kitchen Lack of Transportation (Non-Medical):   Physical Activity:   . Days of Exercise per Week:   . Minutes of Exercise per Session:   Stress:   . Feeling of Stress :   Social Connections:   . Frequency of Communication with Friends and Family:   . Frequency of Social Gatherings with Friends and Family:   . Attends Religious Services:   . Active Member of Clubs or Organizations:   . Attends Archivist Meetings:   Marland Kitchen Marital Status:   Intimate Partner Violence:   . Fear of Current or Ex-Partner:   . Emotionally Abused:   Marland Kitchen Physically Abused:   . Sexually Abused:    PHYSICAL EXAM  Vitals:   05/27/19 0915  BP: 105/72  Pulse: 76  Temp: 98.3 F (36.8 C)   There is no height or weight on file to calculate BMI.  Generalized: In wheelchair,   Neurological examination  Mentation: Alert, speech is very dysarthric, able to follow commands Cranial nerve II-XII: Pupils were equal round reactive to light. Extraocular movements were full,  Face was symmetric, limited range of motion of head turning controlling his modified headset, to move his electronic wheelchair Motor: Significant spasticity of upper and lower extremities, bilateral arms are strapped to wheelchair with limited range of motion, some movement of right hand, able to control on/off button for his wheelchair Reflexes: Deep tendon reflexes are symmetric decreased  DIAGNOSTIC DATA (LABS, IMAGING, TESTING) - I reviewed patient records, labs, notes, testing and imaging myself where available.  Lab  Results  Component Value Date   WBC 12.1 (H) 05/10/2019   HGB  15.7 05/10/2019   HCT 49.1 05/10/2019   MCV 93.2 05/10/2019   PLT 240 05/10/2019      Component Value Date/Time   NA 141 05/10/2019 1904   K 3.7 05/10/2019 1904   CL 103 05/10/2019 1904   CO2 27 05/10/2019 1904   GLUCOSE 105 (H) 05/10/2019 1904   BUN 10 05/10/2019 1904   CREATININE 0.89 05/10/2019 1904   CREATININE 1.00 07/26/2015 1426   CALCIUM 9.3 05/10/2019 1904   PROT 7.0 05/10/2019 1904   ALBUMIN 4.1 05/10/2019 1904   AST 24 05/10/2019 1904   ALT 18 05/10/2019 1904   ALKPHOS 50 05/10/2019 1904   BILITOT 1.2 05/10/2019 1904   GFRNONAA >60 05/10/2019 1904   GFRNONAA >89 07/26/2015 1426   GFRAA >60 05/10/2019 1904   GFRAA >89 07/26/2015 1426   Lab Results  Component Value Date   CHOL  09/30/2008    87        ATP III CLASSIFICATION:  <200     mg/dL   Desirable  200-239  mg/dL   Borderline High  >=240    mg/dL   High          TRIG 140 12/22/2017   No results found for: HGBA1C No results found for: VITAMINB12 Lab Results  Component Value Date   TSH 0.225 (L) 06/03/2011    ASSESSMENT AND PLAN 39 y.o. year old male   Cerebral palsy with severe spastic quadriplegia Continue Zanaflex 2 mg 8 AM, 4 mg 12 PM, 2 mg 8 PM baclofen 20 mg 4 times a day (8, 12, 3, 8), already at max dose  Flexeril 10 mg, 8 AM, 12 PM, 2 tabs at bedtime Valium 2 mg, 8 AM, 8 PM, we may add in a midday dose if needed   He may continue refill through his primary care physician, only return to clinic for new issues.  Marcial Pacas, M.D. Ph.D.  [facility] [place], [place] [address] Phone: [phone] Fax:      [phone]

## 2019-08-06 ENCOUNTER — Telehealth: Payer: Self-pay | Admitting: Neurology

## 2019-08-06 NOTE — Telephone Encounter (Signed)
Patient's caregiver came into the office wanting to speak with Dr. Krista Blue regarding a medication that was cancelled. He is not listed on the DPR. I told him the rules. There is another telephone note from today from the pharmacy regarding same issue.

## 2019-08-06 NOTE — Telephone Encounter (Signed)
Advised by Jinny Blossom M,NP to route to On Call Jaynee Eagles)

## 2019-08-06 NOTE — Telephone Encounter (Signed)
(  [name], pt caregiver) inquiring why tizanidine (ZANAFLEX) 2 MG capsule has been discontinued and was not notified. Would like a call from on call doctor. This something he will need a response today for his documentation for his records. (Noted by Shanda Howells.)

## 2019-08-06 NOTE — Telephone Encounter (Signed)
The care giver [name] was called and the message from Dr Jaynee Eagles was relayed to him. He was thankful and had no other questions or concerns.  This is FYI no call back requested

## 2019-08-06 NOTE — Telephone Encounter (Signed)
T[place] from [facility] called stating that they are needing clarification on the orders due to it conflicting with another medication. Please advise.  [phone] ext. [phone]

## 2019-08-06 NOTE — Telephone Encounter (Signed)
Pharmacy called and inquired on why patient was on both Flexeril and Zanaflex and that these medications are contraindicated together in which 1 will be like to give.  I reviewed Dr. Rhea Belton note who clearly states patient should be on both these medications, patient has significant spasticity and its not unusual for [place] to give several similar agents to treat if symptoms are severe and this is medically warranted.  I asked pharmacy to continue both as well as his other medications including baclofen and the benzodiazepine.

## 2019-08-09 NOTE — Telephone Encounter (Signed)
This was addressed in a separate phone note.

## 2019-09-05 IMAGING — DX DG CHEST 1V PORT
1 series · 1 of 1 positions shown · non-contrast
Comparison: Chest radiograph 12/19/2017

CLINICAL DATA: Patient on the ventilator.

EXAM:
PORTABLE CHEST 1 VIEW

[chest]
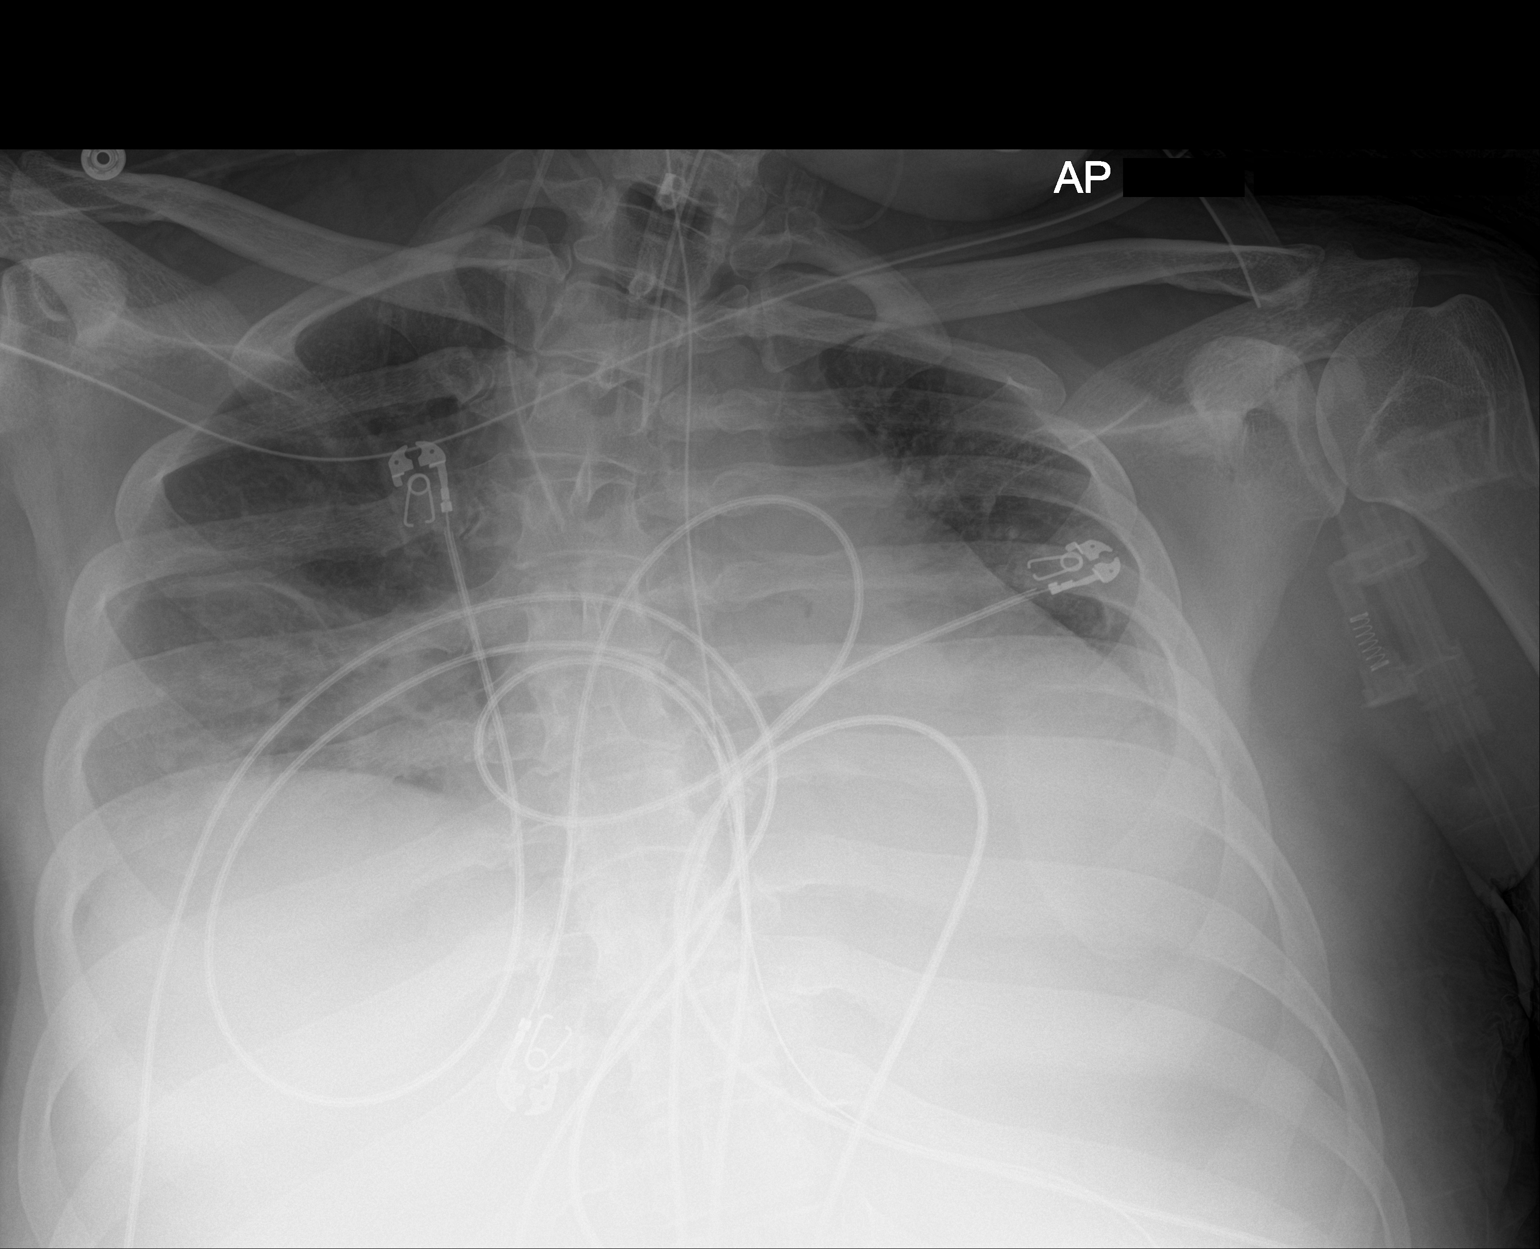

[1 of 1 positions shown; findings below may reference images not displayed]

FINDINGS: ET tube mid trachea. Monitoring leads overlie the patient. Right IJ
central venous catheter tip projects over the superior vena cava.
Cardiomegaly. Low lung volumes. Bibasilar opacities. Probable small
bilateral pleural effusions. No pneumothorax.
IMPRESSION: Support apparatus as above.

Low lung volumes with basilar atelectasis and small effusions.

## 2019-09-06 IMAGING — DX DG CHEST 1V PORT
1 series · 1 of 1 positions shown · non-contrast
Comparison: 12/20/2017 and prior exams

CLINICAL DATA: Respiratory failure

EXAM:
PORTABLE CHEST 1 VIEW

[chest]
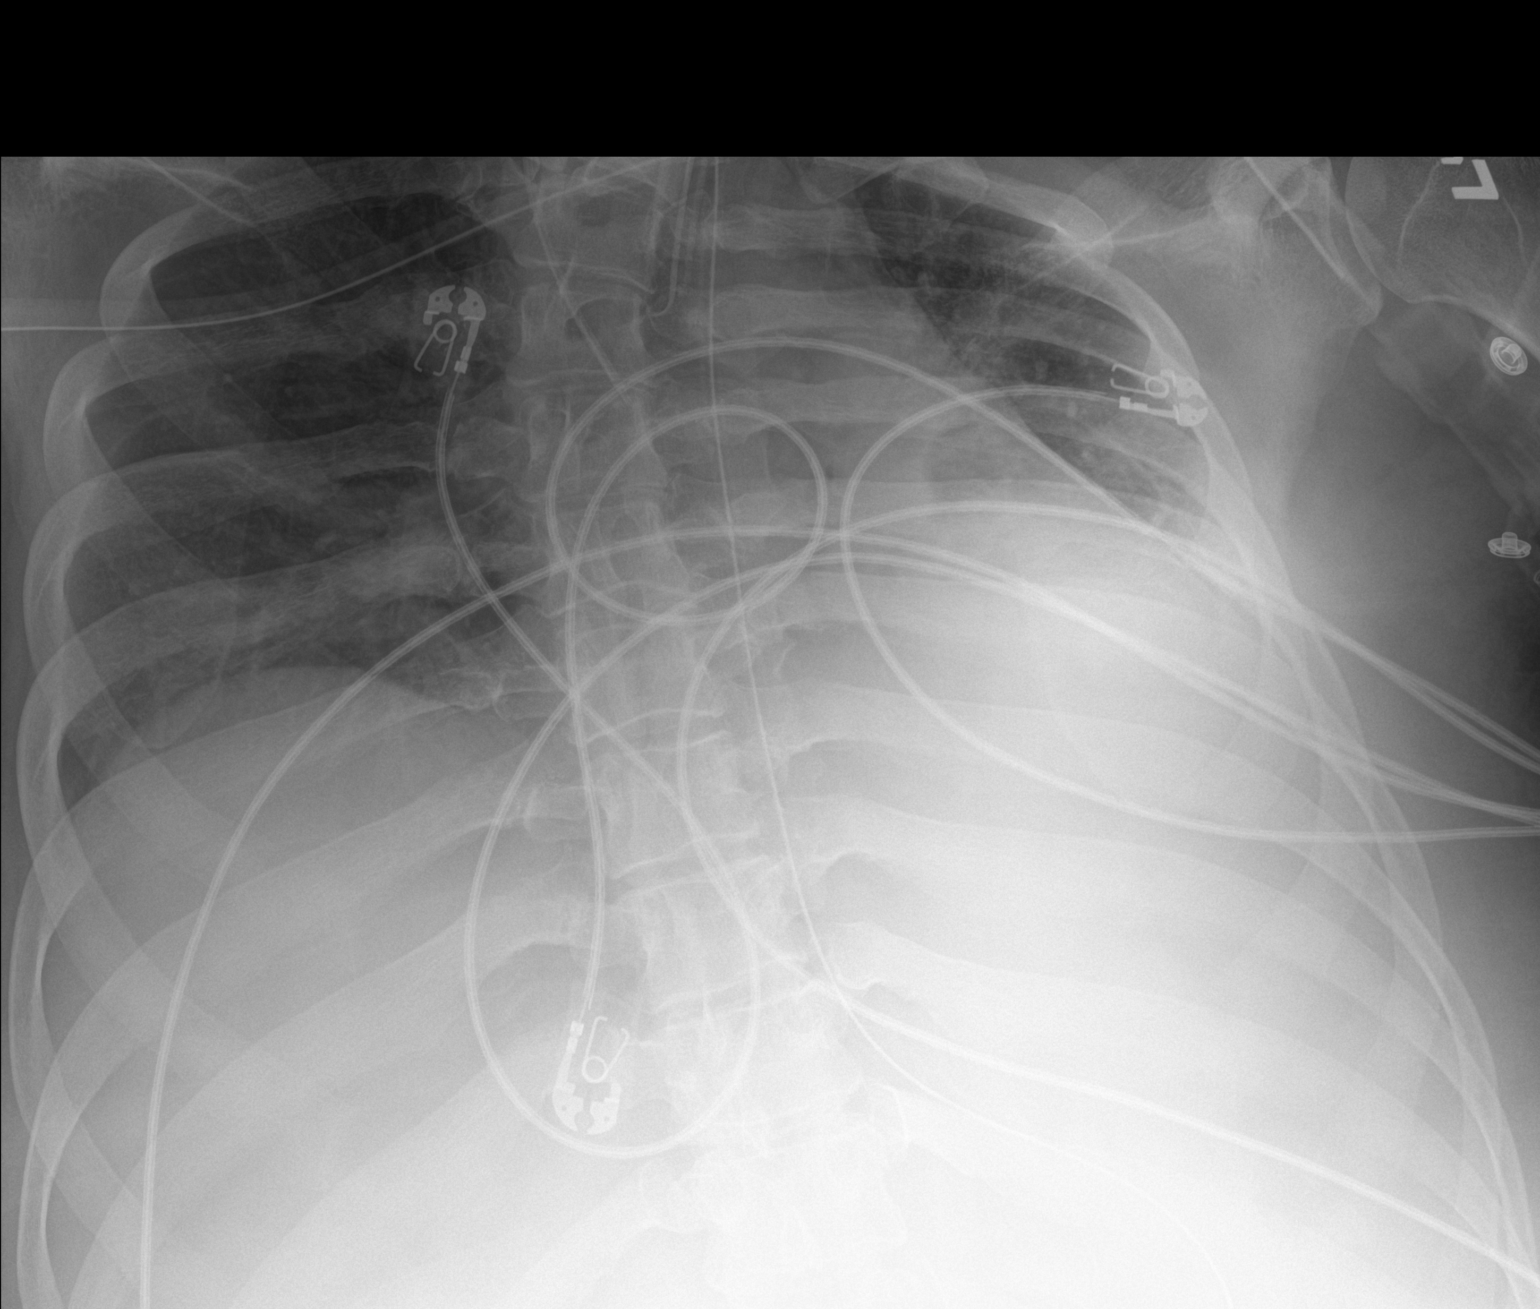

[1 of 1 positions shown; findings below may reference images not displayed]

FINDINGS: An endotracheal tube, NG tube and RIGHT IJ central venous catheter
are unchanged.

This is a very low volume film.

Continued RIGHT basilar atelectasis and LEFT LOWER lung
consolidation/atelectasis noted.

There is no evidence of pneumothorax.

Little significant change noted from the prior study.
IMPRESSION: Unchanged appearance of the chest with RIGHT basilar atelectasis and
LEFT LOWER lung consolidation/atelectasis.

## 2019-11-30 ENCOUNTER — Telehealth: Payer: Self-pay | Admitting: Neurology

## 2019-11-30 NOTE — Telephone Encounter (Signed)
He is going to continue care with his primary care physician. He needs records sent to his new PCP.  [facility] Dr. [name] [address] [place], [place] [address] Phone: [phone] Fax: [phone]

## 2019-11-30 NOTE — Telephone Encounter (Signed)
Caregiver [name]) is requesting a letter stating it is a medical necessity for pt. to be on tiZANidine (ZANAFLEX) 2 MG tablet

## 2019-11-30 NOTE — Telephone Encounter (Signed)
Done I spoke to [facility] to let him know the pt medical records was faxed today to his PCP.

## 2020-05-03 ENCOUNTER — Other Ambulatory Visit: Payer: Self-pay | Admitting: Neurology

## 2021-03-14 ENCOUNTER — Other Ambulatory Visit: Payer: Self-pay | Admitting: Neurology

## 2021-03-19 ENCOUNTER — Encounter (HOSPITAL_COMMUNITY): Payer: Self-pay

## 2021-03-19 ENCOUNTER — Other Ambulatory Visit: Payer: Self-pay

## 2021-03-19 ENCOUNTER — Other Ambulatory Visit: Payer: Self-pay | Admitting: Neurology

## 2021-03-19 ENCOUNTER — Emergency Department (HOSPITAL_COMMUNITY): Payer: Medicare Other

## 2021-03-19 ENCOUNTER — Emergency Department (HOSPITAL_COMMUNITY)
Admission: EM | Admit: 2021-03-19 | Discharge: 2021-03-19 | Disposition: A | Discharge status: Home / Self Care | Payer: Medicare Other | Attending: Emergency Medicine | Admitting: Emergency Medicine

## 2021-03-19 DIAGNOSIS — R791 Abnormal coagulation profile: Secondary | ICD-10-CM | POA: Insufficient documentation

## 2021-03-19 DIAGNOSIS — S60419A Abrasion of unspecified finger, initial encounter: Secondary | ICD-10-CM

## 2021-03-19 DIAGNOSIS — W268XXA Contact with other sharp object(s), not elsewhere classified, initial encounter: Secondary | ICD-10-CM | POA: Diagnosis not present

## 2021-03-19 DIAGNOSIS — S60945A Unspecified superficial injury of left ring finger, initial encounter: Secondary | ICD-10-CM | POA: Diagnosis present

## 2021-03-19 DIAGNOSIS — Z7901 Long term (current) use of anticoagulants: Secondary | ICD-10-CM | POA: Insufficient documentation

## 2021-03-19 DIAGNOSIS — Z23 Encounter for immunization: Secondary | ICD-10-CM | POA: Diagnosis not present

## 2021-03-19 DIAGNOSIS — S60415A Abrasion of left ring finger, initial encounter: Secondary | ICD-10-CM | POA: Insufficient documentation

## 2021-03-19 LAB — PROTIME-INR
INR: 2 — ABNORMAL HIGH (ref 0.8–1.2)
Prothrombin Time: 22.4 seconds — ABNORMAL HIGH (ref 11.4–15.2)

## 2021-03-19 MED ORDER — TETANUS-DIPHTH-ACELL PERTUSSIS 5-2.5-18.5 LF-MCG/0.5 IM SUSY
0.5000 mL | PREFILLED_SYRINGE | Freq: Once | INTRAMUSCULAR | Status: AC
  Administered 2021-03-19: 0.5 mL via INTRAMUSCULAR
  Filled 2021-03-19: qty 0.5

## 2021-03-19 NOTE — Discharge Instructions (Addendum)
As we discussed there is small abrasion, no true laceration that I would repair today.  Continue to apply antibacterial ointment, and keep it covered.  It is lightly oozing due to patient's warfarin, however I do not see active bleeding at this time.  As long as you keep it covered and wrapped should stop bleeding on its own.  Continue to monitor for signs of infection and return if you have any concern for infection.  Your INR was 2.0 which you said is therapeutic for you.

## 2021-03-19 NOTE — ED Provider Triage Note (Signed)
Emergency Medicine Provider Triage Evaluation Note  [name] , a 41 y.o. male  was evaluated in triage.  Pt complains of left ring finger laceration onset prior to arrival.  Patient was in a wheelchair and was attempting to get on a bus when his left ring finger got caught in the door causing it to bleed.  Patient is currently on Coumadin and he has his INRs level checked every Sunday with his most recent being 1.8.  Denies pain to the finger at this time.  Review of Systems  Positive: As per HPI above Negative:   Physical Exam  BP 140/83 (BP Location: Left Arm)    Pulse (!) 108    Temp 97.9 F (36.6 C) (Axillary)    Resp 18    Ht 5\' 5"  (1.651 m)    Wt 81.6 kg    SpO2 100%    BMI 29.95 kg/m  Gen:   Awake, no distress   Resp:  Normal effort  MSK:   Moves extremities without difficulty  Other:  No tenderness to palpation noted to left ring finger.  Bleeding controlled at this time.  See picture below    Medical Decision Making  Medically screening exam initiated at 1:18 PM.  Appropriate orders placed.  Aadan Chenier was informed that the remainder of the evaluation will be completed by another provider, this initial triage assessment does not replace that evaluation, and the importance of remaining in the ED until their evaluation is complete.    Mikale Silversmith A, PA-C 03/19/21 1320

## 2021-03-19 NOTE — ED Triage Notes (Signed)
Patient was getting on a bus with his wheelchair and got to close to the door frame and cut his left ring finger. Pt. Is on Coumadin. No bleeding at this time.

## 2021-03-19 NOTE — ED Provider Notes (Signed)
[facility] DEPT Provider Note   CSN: [id number] Arrival date & time: 03/19/21  1257     History  No chief complaint on file.   [name] is a 41 y.o. 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  Patient reports that the pain is reasonable, however his caretaker reports that it has continued to ooze blood. Also concerned because his INR has been a bit off since recent d/c for 5 days after dental procedure.  HPI     Home Medications Prior to Admission medications   Medication Sig Start Date End Date Taking? Authorizing Provider  acetaminophen (TYLENOL) 325 MG tablet Take 650 mg by mouth every 6 (six) hours as needed. For pain    [provider]  baclofen (LIORESAL) 20 MG tablet Take 1 tablet (20 mg total) by mouth 4 (four) times daily. 05/27/19   Marcial Pacas, MD  calcium carbonate (TUMS - DOSED IN MG ELEMENTAL CALCIUM) 500 MG chewable tablet Chew 1 tablet by mouth 3 (three) times daily.    [provider]  cetirizine (ZYRTEC) 10 MG tablet Take 10 mg by mouth daily.    [provider]  cholecalciferol (VITAMIN D) 1000 UNITS tablet Take 1,000 Units by mouth 2 (two) times daily.    [provider]  cyclobenzaprine (FLEXERIL) 10 MG tablet 1 at 8am and 12pm and 2 tabs at 8pm. 05/27/19   Marcial Pacas, MD  dextromethorphan (DELSYM) 30 MG/5ML liquid Take 30 mg by mouth as needed for cough.    [provider]  diazepam (VALIUM) 2 MG tablet Take 1 tablet (2 mg total) by mouth 2 (two) times daily. Must last 30 days. 05/27/19   Marcial Pacas, MD  diphenhydrAMINE (BENADRYL) 25 MG tablet Take 25 mg by mouth every 6 (six) hours as needed for allergies.    [provider]  fluticasone (VERAMYST) 27.5 MCG/SPRAY nasal spray Place 1  spray into the nose at bedtime as needed for rhinitis.    [provider]  furosemide (LASIX) 20 MG tablet Take 20 mg by mouth daily as needed for fluid (leg edema).    [provider]  glycerin adult 2 g suppository Place 1 suppository rectally as needed for constipation.    [provider]  hydrochlorothiazide (HYDRODIURIL) 25 MG tablet Take 12.5 mg by mouth daily.     [provider]  HYDROcodone-acetaminophen (NORCO/VICODIN) 5-325 MG tablet Take 1-2 tablets by mouth every 6 (six) hours as needed. 05/10/19   Petrucelli, Samantha R, PA-C  loperamide (IMODIUM) 2 MG capsule Take 2 mg by mouth 3 (three) times daily as needed for diarrhea or loose stools. Reported on 05/11/2015    [provider]  metoprolol succinate (TOPROL-XL) 50 MG 24 hr tablet Take 1 tablet (50 mg total) by mouth daily. Patient taking differently: Take 100 mg by mouth daily.  02/26/19   Earnstine Regal, PA-C  mineral oil enema Place 1 enema rectally as needed for severe constipation.    [provider]  Multiple Vitamin (MULITIVITAMIN WITH MINERALS) TABS Take 1 tablet by mouth daily.    [provider]  nitrofurantoin (MACRODANTIN) 50 MG capsule Take 50 mg by mouth daily.    [provider]  ondansetron (ZOFRAN ODT) 4 MG disintegrating tablet Take 1 tablet (4 mg total)  by mouth every 8 (eight) hours as needed for nausea or vomiting. 05/10/19   Petrucelli, Glynda Jaeger, PA-C  Polyethylene Glycol 3350 (PEG 3350) POWD Take 17 g by mouth daily as needed for constipation.    [provider]  psyllium (HYDROCIL/METAMUCIL) 95 % PACK Give him one dose daily as directed on the package.  This is to help with his constipation.  You can buy this over the counter at any drug store. 02/25/19   Earnstine Regal, PA-C  senna-docusate (SENOKOT-S) 8.6-50 MG tablet Take 3 tablets by mouth at bedtime.    [provider]  Stannous Fluoride (GEL-KAM) 0.4 % GEL Place 1  application onto teeth daily.    [provider]  tiZANidine (ZANAFLEX) 2 MG tablet TAKE 1 TABLET BY MOUTH TWICE A DAY;TAKE 2 TABLETS (4MG ) BY MOUTH AT 12PM (DAY PROGRAM) 05/03/20   Marcial Pacas, MD  traMADol (ULTRAM) 50 MG tablet Take 100 mg by mouth every 6 (six) hours as needed for moderate pain.    [provider]  warfarin (COUMADIN) 3 MG tablet Take 3 mg by mouth daily at 6 PM.     [provider]      Allergies    Betadine [povidone iodine], Ciprofloxacin, Fentanyl, and Morphine    Review of Systems   Review of Systems  Skin:  Positive for wound.  All other systems reviewed and are negative.  Physical Exam Updated Vital Signs BP 140/83 (BP Location: Left Arm)    Pulse (!) 108    Temp 97.9 F (36.6 C) (Axillary)    Resp 18    Ht 5\' 5"  (1.651 m)    Wt 81.6 kg    SpO2 100%    BMI 29.95 kg/m  Physical Exam Vitals and nursing note reviewed.  Constitutional:      General: He is not in acute distress.    Appearance: Normal appearance.     Comments: Normal appearance for patient with baseline cerebral palsy  HENT:     Head: Normocephalic and atraumatic.  Eyes:     General:        Right eye: No discharge.        Left eye: No discharge.  Cardiovascular:     Rate and Rhythm: Normal rate and regular rhythm.  Pulmonary:     Effort: Pulmonary effort is normal. No respiratory distress.  Musculoskeletal:        General: No deformity.     Comments: Intact strength to flexion extension of the affected left ring finger  Skin:    General: Skin is warm and dry.     Comments: 2 small foci of shallow abrasion without laceration on the lateral aspect of the left ring finger.  Gently oozing blood, no significant surrounding redness, no retained foreign body noted.  Neurological:     Mental Status: He is alert. Mental status is at baseline.  Psychiatric:        Mood and Affect: Mood normal.        Behavior: Behavior normal.    ED Results / Procedures / Treatments    Labs (all labs ordered are listed, but only abnormal results are displayed) Labs Reviewed  PROTIME-INR - Abnormal; Notable for the following components:      Result Value   Prothrombin Time 22.4 (*)    INR 2.0 (*)    All other components within normal limits    EKG None  Radiology DG Finger Ring Left  Result Date: 03/19/2021 CLINICAL DATA:  Injury of the LEFT ring finger. Patient's finger was cut while getting on a bus in the wheelchair. EXAM: LEFT RING FINGER 2+V COMPARISON:  None. FINDINGS: There is no acute fracture or subluxation. No radiopaque foreign body or soft tissue gas. IMPRESSION: Negative. Electronically Signed   By: Nolon Nations M.D.   On: 03/19/2021 14:18    Procedures Procedures    Medications Ordered in ED Medications  Tdap (BOOSTRIX) injection 0.5 mL (0.5 mLs Intramuscular Given 03/19/21 1350)    ED Course/ Medical Decision Making/ A&P                           Medical Decision Making  Is an overall well-appearing patient with past medical history significant for cerebral palsy who presents with concern for laceration, oozing blood on the left ring finger after sustaining an injury while try to get on a bus in his wheelchair.  I dependently ordered and interpreted plain film radiograph of the left ring finger which shows no fracture, no dislocation.  Patient's INR is 2.0 which is therapeutic for him.  I do not believe that patient's wound would benefit from laceration repair.  Encouraged wound care with antibacterial ointment, patient's tetanus updated at this time.  He is discharged in stable condition at this time, encouraged to follow-up with his PCP to ensure proper healing.  Patient understands agrees to plan, his caretaker understands and agrees to plan.  He is discharged in stable condition at this time. Final Clinical Impression(s) / ED Diagnoses Final diagnoses:  Abrasion of finger, initial encounter    Rx / DC Orders ED Discharge Orders      None         Dorien Chihuahua 03/19/21 1506    Luna Fuse, MD 03/25/21 [phone]

## 2021-05-14 ENCOUNTER — Emergency Department (HOSPITAL_COMMUNITY): Payer: Medicare Other

## 2021-05-14 ENCOUNTER — Inpatient Hospital Stay (HOSPITAL_COMMUNITY)
Admission: EM | Admit: 2021-05-14 | Discharge: 2021-05-18 | DRG: 389 | Disposition: A | Discharge status: Home Health | Payer: Medicare Other | Attending: Internal Medicine | Admitting: Internal Medicine

## 2021-05-14 ENCOUNTER — Encounter (HOSPITAL_COMMUNITY): Payer: Self-pay

## 2021-05-14 ENCOUNTER — Other Ambulatory Visit: Payer: Self-pay

## 2021-05-14 ENCOUNTER — Inpatient Hospital Stay (HOSPITAL_COMMUNITY): Payer: Medicare Other

## 2021-05-14 DIAGNOSIS — E872 Acidosis, unspecified: Secondary | ICD-10-CM | POA: Diagnosis present

## 2021-05-14 DIAGNOSIS — L89152 Pressure ulcer of sacral region, stage 2: Secondary | ICD-10-CM | POA: Diagnosis present

## 2021-05-14 DIAGNOSIS — I4891 Unspecified atrial fibrillation: Secondary | ICD-10-CM | POA: Diagnosis present

## 2021-05-14 DIAGNOSIS — Z885 Allergy status to narcotic agent status: Secondary | ICD-10-CM | POA: Diagnosis not present

## 2021-05-14 DIAGNOSIS — K56600 Partial intestinal obstruction, unspecified as to cause: Secondary | ICD-10-CM | POA: Diagnosis not present

## 2021-05-14 DIAGNOSIS — R4189 Other symptoms and signs involving cognitive functions and awareness: Secondary | ICD-10-CM | POA: Diagnosis present

## 2021-05-14 DIAGNOSIS — G801 Spastic diplegic cerebral palsy: Secondary | ICD-10-CM | POA: Diagnosis present

## 2021-05-14 DIAGNOSIS — K56609 Unspecified intestinal obstruction, unspecified as to partial versus complete obstruction: Secondary | ICD-10-CM | POA: Diagnosis present

## 2021-05-14 DIAGNOSIS — K5909 Other constipation: Secondary | ICD-10-CM | POA: Diagnosis present

## 2021-05-14 DIAGNOSIS — R000 Tachycardia, unspecified: Secondary | ICD-10-CM | POA: Diagnosis present

## 2021-05-14 DIAGNOSIS — Z881 Allergy status to other antibiotic agents status: Secondary | ICD-10-CM | POA: Diagnosis not present

## 2021-05-14 DIAGNOSIS — I82522 Chronic embolism and thrombosis of left iliac vein: Secondary | ICD-10-CM | POA: Diagnosis present

## 2021-05-14 DIAGNOSIS — I480 Paroxysmal atrial fibrillation: Secondary | ICD-10-CM | POA: Diagnosis present

## 2021-05-14 DIAGNOSIS — I10 Essential (primary) hypertension: Secondary | ICD-10-CM | POA: Diagnosis present

## 2021-05-14 DIAGNOSIS — Z7901 Long term (current) use of anticoagulants: Secondary | ICD-10-CM | POA: Diagnosis not present

## 2021-05-14 DIAGNOSIS — Z87440 Personal history of urinary (tract) infections: Secondary | ICD-10-CM

## 2021-05-14 DIAGNOSIS — R252 Cramp and spasm: Secondary | ICD-10-CM | POA: Diagnosis present

## 2021-05-14 DIAGNOSIS — Z79899 Other long term (current) drug therapy: Secondary | ICD-10-CM

## 2021-05-14 DIAGNOSIS — E860 Dehydration: Secondary | ICD-10-CM | POA: Diagnosis present

## 2021-05-14 DIAGNOSIS — Z888 Allergy status to other drugs, medicaments and biological substances status: Secondary | ICD-10-CM

## 2021-05-14 DIAGNOSIS — G809 Cerebral palsy, unspecified: Secondary | ICD-10-CM | POA: Diagnosis present

## 2021-05-14 LAB — URINALYSIS, ROUTINE W REFLEX MICROSCOPIC
Bilirubin Urine: NEGATIVE
Glucose, UA: NEGATIVE mg/dL
Hgb urine dipstick: NEGATIVE
Ketones, ur: 20 mg/dL — AB
Leukocytes,Ua: NEGATIVE
Nitrite: POSITIVE — AB
Protein, ur: 30 mg/dL — AB
Specific Gravity, Urine: 1.035 — ABNORMAL HIGH (ref 1.005–1.030)
pH: 8 (ref 5.0–8.0)

## 2021-05-14 LAB — CBC WITH DIFFERENTIAL/PLATELET
Abs Immature Granulocytes: 0.02 10*3/uL (ref 0.00–0.07)
Basophils Absolute: 0 10*3/uL (ref 0.0–0.1)
Basophils Relative: 0 %
Eosinophils Absolute: 0.1 10*3/uL (ref 0.0–0.5)
Eosinophils Relative: 1 %
HCT: 44.4 % (ref 39.0–52.0)
Hemoglobin: 15 g/dL (ref 13.0–17.0)
Immature Granulocytes: 0 %
Lymphocytes Relative: 12 %
Lymphs Abs: 1.2 10*3/uL (ref 0.7–4.0)
MCH: 30.7 pg (ref 26.0–34.0)
MCHC: 33.8 g/dL (ref 30.0–36.0)
MCV: 91 fL (ref 80.0–100.0)
Monocytes Absolute: 0.8 10*3/uL (ref 0.1–1.0)
Monocytes Relative: 8 %
Neutro Abs: 7.8 10*3/uL — ABNORMAL HIGH (ref 1.7–7.7)
Neutrophils Relative %: 79 %
Platelets: 224 10*3/uL (ref 150–400)
RBC: 4.88 MIL/uL (ref 4.22–5.81)
RDW: 14.2 % (ref 11.5–15.5)
WBC: 9.9 10*3/uL (ref 4.0–10.5)
nRBC: 0 % (ref 0.0–0.2)

## 2021-05-14 LAB — COMPREHENSIVE METABOLIC PANEL
ALT: 21 U/L (ref 0–44)
AST: 23 U/L (ref 15–41)
Albumin: 3.8 g/dL (ref 3.5–5.0)
Alkaline Phosphatase: 63 U/L (ref 38–126)
Anion gap: 8 (ref 5–15)
BUN: 12 mg/dL (ref 6–20)
CO2: 32 mmol/L (ref 22–32)
Calcium: 9.2 mg/dL (ref 8.9–10.3)
Chloride: 101 mmol/L (ref 98–111)
Creatinine, Ser: 0.9 mg/dL (ref 0.61–1.24)
GFR, Estimated: >60 mL/min (ref >60)
Glucose, Bld: 109 mg/dL — ABNORMAL HIGH (ref 70–99)
Potassium: 3.9 mmol/L (ref 3.5–5.1)
Sodium: 141 mmol/L (ref 135–145)
Total Bilirubin: 1.2 mg/dL (ref 0.3–1.2)
Total Protein: 6.7 g/dL (ref 6.5–8.1)

## 2021-05-14 LAB — PROTIME-INR
INR: 2.4 — ABNORMAL HIGH (ref 0.8–1.2)
Prothrombin Time: 25.5 seconds — ABNORMAL HIGH (ref 11.4–15.2)

## 2021-05-14 LAB — LIPASE, BLOOD: Lipase: 25 U/L (ref 11–51)

## 2021-05-14 LAB — LACTIC ACID, PLASMA
Lactic Acid, Venous: 2.9 mmol/L (ref 0.5–1.9)
Lactic Acid, Venous: 2.8 mmol/L (ref 0.5–1.9)

## 2021-05-14 MED ORDER — LACTATED RINGERS IV SOLN: INTRAVENOUS | Status: DC

## 2021-05-14 MED ORDER — LACTATED RINGERS IV BOLUS
1000.0000 mL | Freq: Once | INTRAVENOUS | Status: AC
  Administered 2021-05-14: 1000 mL via INTRAVENOUS

## 2021-05-14 MED ORDER — MIDAZOLAM HCL 2 MG/2ML IJ SOLN
1.0000 mg | Freq: Once | INTRAMUSCULAR | Status: AC
  Administered 2021-05-14: 1 mg via INTRAVENOUS
  Filled 2021-05-14: qty 2

## 2021-05-14 MED ORDER — ONDANSETRON HCL 4 MG/2ML IJ SOLN
4.0000 mg | Freq: Once | INTRAMUSCULAR | Status: AC
  Administered 2021-05-14: 4 mg via INTRAVENOUS
  Filled 2021-05-14: qty 2

## 2021-05-14 MED ORDER — LORAZEPAM 2 MG/ML IJ SOLN
1.0000 mg | Freq: Three times a day (TID) | INTRAMUSCULAR | Status: DC | PRN
  Administered 2021-05-14 – 2021-05-18 (×6): 1 mg via INTRAVENOUS
  Filled 2021-05-14 (×6): qty 1

## 2021-05-14 MED ORDER — SODIUM CHLORIDE 0.9 % IV BOLUS
1000.0000 mL | Freq: Once | INTRAVENOUS | Status: AC
  Administered 2021-05-14: 1000 mL via INTRAVENOUS

## 2021-05-14 MED ORDER — METOPROLOL TARTRATE 5 MG/5ML IV SOLN: 5.0000 mg | Freq: Four times a day (QID) | INTRAVENOUS | Status: DC | PRN

## 2021-05-14 MED ORDER — LACTATED RINGERS IV BOLUS
1000.0000 mL | Freq: Three times a day (TID) | INTRAVENOUS | Status: DC | PRN
Start: 2021-05-14 — End: 2021-05-16

## 2021-05-14 MED ORDER — IOHEXOL 300 MG/ML  SOLN
100.0000 mL | Freq: Once | INTRAMUSCULAR | Status: AC | PRN
  Administered 2021-05-14: 100 mL via INTRAVENOUS

## 2021-05-14 MED ORDER — LORAZEPAM 2 MG/ML IJ SOLN
0.5000 mg | Freq: Once | INTRAMUSCULAR | Status: AC
  Administered 2021-05-15: 0.5 mg via INTRAVENOUS
  Filled 2021-05-14: qty 1

## 2021-05-14 MED ORDER — PROCHLORPERAZINE EDISYLATE 10 MG/2ML IJ SOLN
10.0000 mg | Freq: Four times a day (QID) | INTRAMUSCULAR | Status: DC | PRN
  Administered 2021-05-14: 10 mg via INTRAVENOUS
  Filled 2021-05-14: qty 2

## 2021-05-14 MED ORDER — DIATRIZOATE MEGLUMINE & SODIUM 66-10 % PO SOLN
90.0000 mL | Freq: Once | ORAL | Status: AC
  Administered 2021-05-14: 90 mL via NASOGASTRIC
  Filled 2021-05-14: qty 90

## 2021-05-14 MED ORDER — MIDAZOLAM HCL 5 MG/5ML IJ SOLN: 1.0000 mg | Freq: Once | INTRAMUSCULAR | Status: DC

## 2021-05-14 MED ORDER — KETOROLAC TROMETHAMINE 15 MG/ML IJ SOLN: 15.0000 mg | Freq: Four times a day (QID) | INTRAMUSCULAR | Status: DC | PRN

## 2021-05-14 MED ORDER — DIAZEPAM 5 MG/ML IJ SOLN
2.0000 mg | Freq: Once | INTRAMUSCULAR | Status: AC
  Administered 2021-05-14: 2 mg via INTRAVENOUS
  Filled 2021-05-14: qty 2

## 2021-05-14 NOTE — ED Provider Notes (Signed)
?[facility] DEPT ?Provider Note ? ? ?CSN: [id number] ?Arrival date & time: 05/14/21  0507 ? ?  ? ?History ? ?Chief Complaint  ?Patient presents with  ? Abdominal Pain  ? ? ?[name] is a 41 y.o. male. ? ?[name] is a 41 y.o. male with a history of cerebral palsy, DVT, hypertension, chronic constipation and recurrent UTIs, with history of multiple small bowel obstructions, who presents to the emergency department for evaluation of abdominal pain.  Patient reports abdominal pain developed overnight.  He reports diffuse abdominal pain that feels similar to his prior bowel obstructions.  He reports he has been very nauseated but has not vomited.  Last bowel movement was 2 days ago, he thinks he has been passing some gas.  No fevers or chills.  No chest pain.  Reports normal output clear. ? ? ?Abdominal Pain ?Associated symptoms: constipation and nausea   ?Associated symptoms: no chest pain, no chills, no cough, no dysuria, no fever, no shortness of breath and no vomiting   ? ?  ? ?Home Medications ?Prior to Admission medications   ?Medication Sig Start Date End Date Taking? Authorizing Provider  ?acetaminophen (TYLENOL) 325 MG tablet Take 650 mg by mouth every 6 (six) hours as needed. For pain    [provider]  ?baclofen (LIORESAL) 20 MG tablet Take 1 tablet (20 mg total) by mouth 4 (four) times daily. 05/27/19   Marcial Pacas, MD  ?calcium carbonate (TUMS - DOSED IN MG ELEMENTAL CALCIUM) 500 MG chewable tablet Chew 1 tablet by mouth 3 (three) times daily.    [provider]  ?cetirizine (ZYRTEC) 10 MG tablet Take 10 mg by mouth daily.    [provider]  ?cholecalciferol (VITAMIN D) 1000 UNITS tablet Take 1,000 Units by mouth 2 (two) times daily.    [provider]  ?cyclobenzaprine (FLEXERIL) 10 MG tablet 1 at 8am and 12pm and 2 tabs at 8pm. 05/27/19   Marcial Pacas, MD  ?dextromethorphan (DELSYM) 30 MG/5ML liquid Take 30 mg by mouth as  needed for cough.    [provider]  ?diazepam (VALIUM) 2 MG tablet Take 1 tablet (2 mg total) by mouth 2 (two) times daily. Must last 30 days. 05/27/19   Marcial Pacas, MD  ?diphenhydrAMINE (BENADRYL) 25 MG tablet Take 25 mg by mouth every 6 (six) hours as needed for allergies.    [provider]  ?fluticasone (VERAMYST) 27.5 MCG/SPRAY nasal spray Place 1 spray into the nose at bedtime as needed for rhinitis.    [provider]  ?furosemide (LASIX) 20 MG tablet Take 20 mg by mouth daily as needed for fluid (leg edema).    [provider]  ?glycerin adult 2 g suppository Place 1 suppository rectally as needed for constipation.    [provider]  ?hydrochlorothiazide (HYDRODIURIL) 25 MG tablet Take 12.5 mg by mouth daily.     [provider]  ?HYDROcodone-acetaminophen (NORCO/VICODIN) 5-325 MG tablet Take 1-2 tablets by mouth every 6 (six) hours as needed. 05/10/19   Petrucelli, Aldona Bar R, PA-C  ?loperamide (IMODIUM) 2 MG capsule Take 2 mg by mouth 3 (three) times daily as needed for diarrhea or loose stools. Reported on 05/11/2015    [provider]  ?metoprolol succinate (TOPROL-XL) 50 MG 24 hr tablet Take 1 tablet (50 mg total) by mouth daily. ?Patient taking differently: Take 100 mg by mouth daily.  02/26/19   Earnstine Regal, PA-C  ?mineral oil enema Place 1 enema rectally  as needed for severe constipation.    [provider]  ?Multiple Vitamin (MULITIVITAMIN WITH MINERALS) TABS Take 1 tablet by mouth daily.    [provider]  ?nitrofurantoin (MACRODANTIN) 50 MG capsule Take 50 mg by mouth daily.    [provider]  ?ondansetron (ZOFRAN ODT) 4 MG disintegrating tablet Take 1 tablet (4 mg total) by mouth every 8 (eight) hours as needed for nausea or vomiting. 05/10/19   Petrucelli, Glynda Jaeger, PA-C  ?Polyethylene Glycol 3350 (PEG 3350) POWD Take 17 g by mouth daily as needed for constipation.    [provider]   ?psyllium (HYDROCIL/METAMUCIL) 95 % PACK Give him one dose daily as directed on the package.  This is to help with his constipation.  You can buy this over the counter at any drug store. 02/25/19   Earnstine Regal, PA-C  ?senna-docusate (SENOKOT-S) 8.6-50 MG tablet Take 3 tablets by mouth at bedtime.    [provider]  ?Stannous Fluoride (GEL-KAM) 0.4 % GEL Place 1 application onto teeth daily.    [provider]  ?tiZANidine (ZANAFLEX) 2 MG tablet TAKE 1 TABLET BY MOUTH TWICE A DAY;TAKE 2 TABLETS ('4MG'$ ) BY MOUTH AT 12PM (DAY PROGRAM) 05/03/20   Marcial Pacas, MD  ?traMADol (ULTRAM) 50 MG tablet Take 100 mg by mouth every 6 (six) hours as needed for moderate pain.    [provider]  ?warfarin (COUMADIN) 3 MG tablet Take 3 mg by mouth daily at 6 PM.     [provider]  ?   ? ?Allergies    ?Betadine [povidone iodine], Ciprofloxacin, Fentanyl, and Morphine   ? ?Review of Systems   ?Review of Systems  ?Constitutional:  Negative for chills and fever.  ?HENT: Negative.    ?Respiratory:  Negative for cough and shortness of breath.   ?Cardiovascular:  Negative for chest pain.  ?Gastrointestinal:  Positive for abdominal pain, constipation and nausea. Negative for vomiting.  ?Genitourinary:  Negative for dysuria and frequency.  ?Musculoskeletal:  Negative for arthralgias and myalgias.  ?Skin:  Negative for color change and rash.  ?Neurological:  Negative for dizziness, syncope and light-headedness.  ?All other systems reviewed and are negative. ? ?Physical Exam ?Updated Vital Signs ?BP 120/69   Pulse 93   Temp (!) 96.7 ?F (35.9 ?C) (Axillary)   Resp (!) 25   SpO2 95%  ?Physical Exam ?Vitals and nursing note reviewed.  ?Constitutional:   ?   General: He is not in acute distress. ?   Appearance: Normal appearance. He is ill-appearing. He is not diaphoretic.  ?   Comments: Alert, chronically ill-appearing, appears uncomfortable  ?HENT:  ?   Head: Normocephalic and atraumatic.  ?Eyes:  ?    General:     ?   Right eye: No discharge.     ?   Left eye: No discharge.  ?   Pupils: Pupils are equal, round, and reactive to light.  ?Cardiovascular:  ?   Rate and Rhythm: Normal rate and regular rhythm.  ?   Pulses: Normal pulses.  ?   Heart sounds: Normal heart sounds.  ?Pulmonary:  ?   Effort: Pulmonary effort is normal. No respiratory distress.  ?   Breath sounds: Normal breath sounds. No wheezing or rales.  ?   Comments: Respirations equal and unlabored, patient able to speak in full sentences, lungs clear to auscultation bilaterally  ?Abdominal:  ?   General: There is distension.  ?   Palpations: Abdomen is soft. There  is no mass.  ?   Tenderness: There is abdominal tenderness. There is no guarding.  ?   Comments: Abdomen is distended with generalized tenderness, patient with high-pitched bowel sounds in the upper quadrants and minimal bowel sounds noted in the lower abdomen  ?Genitourinary: ?   Comments: Foley catheter in place ?Musculoskeletal:     ?   General: No deformity.  ?   Cervical back: Neck supple.  ?Skin: ?   General: Skin is warm and dry.  ?   Capillary Refill: Capillary refill takes less than 2 seconds.  ?Neurological:  ?   Mental Status: He is alert and oriented to person, place, and time.  ?   Coordination: Coordination normal.  ?Psychiatric:     ?   Mood and Affect: Mood normal.     ?   Behavior: Behavior normal.  ? ? ?ED Results / Procedures / Treatments   ?Labs ?(all labs ordered are listed, but only abnormal results are displayed) ?Labs Reviewed  ?CBC WITH DIFFERENTIAL/PLATELET - Abnormal; Notable for the following components:  ?    Result Value  ? Neutro Abs 7.8 (*)   ? All other components within normal limits  ?COMPREHENSIVE METABOLIC PANEL  ?LIPASE, BLOOD  ?URINALYSIS, ROUTINE W REFLEX MICROSCOPIC  ?LACTIC ACID, PLASMA  ?LACTIC ACID, PLASMA  ?PROTIME-INR  ? ? ?EKG ?None ? ?Radiology ?DG Abd Portable 1 View ? ?Result Date: 05/14/2021 ?CLINICAL DATA:  41 year old male with cerebral  palsy. Abdominal pain overnight. No bowel movement in 2 days. Nausea vomiting. EXAM: PORTABLE ABDOMEN - 1 VIEW COMPARISON:  CT Abdomen and Pelvis 05/10/2019 and earlier. FINDINGS: AP view at 0537 hours. Abnormally gas d

## 2021-05-14 NOTE — Progress Notes (Signed)
ANTICOAGULATION CONSULT NOTE - Initial Consult ? ?Pharmacy Consult for IV heparin - start when INR < 2 ?Indication: atrial fibrillation ? ?Allergies  ?Allergen Reactions  ? Betadine [Povidone Iodine] Other (See Comments)  ?  Unknown reaction per caregiver  ? Ciprofloxacin Other (See Comments)  ?  Unknown reaction per caregiver  ? Fentanyl Other (See Comments)  ?  Previous reaction resulting in intubation, unclear details per family  ? Morphine Other (See Comments)  ?  Unknown reaction per caregiver  ? ? ?Patient Measurements: ?  ?Heparin Dosing Weight:  ? ?Vital Signs: ?Temp: 96.7 ?F (35.9 ?C) (04/17 0516) ?Temp Source: Axillary (04/17 0516) ?BP: 124/70 (04/17 0700) ?Pulse Rate: 102 (04/17 0925) ? ?Labs: ?Recent Labs  ?  05/14/21 ?0613  ?HGB 15.0  ?HCT 44.4  ?PLT 224  ?LABPROT 25.5*  ?INR 2.4*  ?CREATININE 0.90  ? ? ?CrCl cannot be calculated (Unknown ideal weight.). ? ? ?Medical History: ?Past Medical History:  ?Diagnosis Date  ? Cerebral palsy ([place])   ? Chronic constipation   ? Coagulopathy ([place])   ? DVT (deep venous thrombosis) ([place])   ? Hypertension   ? Insomnia   ? Recurrent UTI   ? Tachycardia   ? ? ?Medications:  ?Scheduled:  ? diatrizoate meglumine-sodium  90 mL Per NG tube Once  ? midazolam  1 mg Intravenous Once  ? ?Infusions:  ? lactated ringers    ? ? ?Assessment: ?41 yo presented to ER with abdominal pain to be admitted to [facility] with SBO, CCS following. Note patient on warfarin prior to admission for hx Afib. INR currently therapeutic but since patient currently NPO due to SBO, will start IV UFH once INR becomes < 2 subtherapeutic ? ?Goal of Therapy:  ?Heparin level 0.3-0.7 units/ml ?Monitor platelets by anticoagulation protocol: Yes ?  ?Plan:  ?Start IV UFH when INR < 2 ?Daily INR, CBC ? ?Kara Mead ?05/14/2021,10:37 AM ? ? ?

## 2021-05-14 NOTE — Progress Notes (Signed)
?   05/14/21 2205  ?Assess: MEWS Score  ?Temp 98.2 ?F (36.8 ?C)  ?BP 107/76  ?Pulse Rate (!) 121  ?Resp 20  ?SpO2 95 %  ?O2 Device Room Air  ?Assess: MEWS Score  ?MEWS Temp 0  ?MEWS Systolic 0  ?MEWS Pulse 2  ?MEWS RR 0  ?MEWS LOC 0  ?MEWS Score 2  ?MEWS Score Color Yellow  ?Assess: if the MEWS score is Yellow or Red  ?Were vital signs taken at a resting state? Yes  ?Focused Assessment No change from prior assessment  ?Does the patient meet 2 or more of the SIRS criteria? No  ?MEWS guidelines implemented *See Row Information* No, previously yellow, continue vital signs every 4 hours  ?Document  ?Patient Outcome Other (Comment) ?(patient remain on unit)  ?Progress note created (see row info) Yes  ?Assess: SIRS CRITERIA  ?SIRS Temperature  0  ?SIRS Pulse 1  ?SIRS Respirations  0  ?SIRS WBC 0  ?SIRS Score Sum  1  ? ?Patient continue to be yellow mews and remain on the unit. Mews protocol continues.  ?

## 2021-05-14 NOTE — Progress Notes (Signed)
?   05/14/21 2027  ?Assess: MEWS Score  ?Temp 97.8 ?F (36.6 ?C)  ?BP 112/74  ?Pulse Rate (!) 128  ?Resp 20  ?SpO2 95 %  ?O2 Device Room Air  ?Assess: MEWS Score  ?MEWS Temp 0  ?MEWS Systolic 0  ?MEWS Pulse 2  ?MEWS RR 0  ?MEWS LOC 0  ?MEWS Score 2  ?MEWS Score Color Yellow  ?Assess: if the MEWS score is Yellow or Red  ?Were vital signs taken at a resting state? Yes  ?Focused Assessment No change from prior assessment  ?Does the patient meet 2 or more of the SIRS criteria? No  ?MEWS guidelines implemented *See Row Information* No, previously yellow, continue vital signs every 4 hours  ?Treat  ?Complains of Other (Comment) ?(muscle twitching due to condition)  ?Interventions Medication (see MAR)  ?Document  ?Patient Outcome Other (Comment) ?(remain on the unit)  ?Progress note created (see row info) Yes  ?Assess: SIRS CRITERIA  ?SIRS Temperature  0  ?SIRS Pulse 1  ?SIRS Respirations  0  ?SIRS WBC 0  ?SIRS Score Sum  1  ? ?Patient continue to be yellow mews. PRN ativan was administered at patient request for being restless and jerking movements due to his condition. Patient remain on the unit. MEWS protocol will continue. ?

## 2021-05-14 NOTE — H&P (Addendum)
?History and Physical  ? ? ?Patient: [name] PTW:[id number] DOB: 1980-10-22 ?DOA: 05/14/2021 ?DOS: the patient was seen and examined on 05/14/2021 ?PCP: [name], MD  ?Patient coming from: Home ? ?Chief Complaint:  ?Chief Complaint  ?Patient presents with  ? Abdominal Pain  ? ?HPI: [name] is a 41 y.o. male with medical history significant of cerebral palsy, chronic constipation, HTN. Presenting with stomach pain. His pain was sudden onset last night. He feels nauseous, but did not have V/D or fever. He didn't try any meds or therapies. It reminded him of his previous obstruction, so he came to the ED for assistance. He denies any other aggravating or alleviating factors.   ? ?Review of Systems: As mentioned in the history of present illness. All other systems reviewed and are negative. ?Past Medical History:  ?Diagnosis Date  ? Cerebral palsy ([place])   ? Chronic constipation   ? Coagulopathy ([place])   ? DVT (deep venous thrombosis) ([place])   ? Hypertension   ? Insomnia   ? Recurrent UTI   ? Tachycardia   ? ?Past Surgical History:  ?Procedure Laterality Date  ? ELBOW SURGERY  09/12/10  ? left  ? INCISE AND DRAIN ABCESS  09/11/10  ? LAPAROTOMY N/A 12/15/2017  ? Procedure: EXPLORATORY LAPAROTOMY;  Surgeon: Kinsinger, Arta Bruce, MD;  Location: [place];  Service: General;  Laterality: N/A;  ? SMALL INTESTINE SURGERY    ? TOOTH EXTRACTION  11/22/2011  ? Procedure: DENTAL RESTORATION/EXTRACTIONS;  Surgeon: Marcelo Baldy, MD;  Location: [place];  Service: Dentistry;  Laterality: N/A;  dental restoration and cleaning; NO EXTRACTIONS  ? ?Social History:  reports that he has never smoked. He has never used smokeless tobacco. He reports that he does not drink alcohol and does not use drugs. ? ?Allergies  ?Allergen Reactions  ? Betadine [Povidone Iodine] Other (See Comments)  ?  Unknown reaction per caregiver  ? Ciprofloxacin Other (See Comments)  ?  Unknown reaction per caregiver  ? Fentanyl Other (See Comments)  ?   Previous reaction resulting in intubation, unclear details per family  ? Morphine Other (See Comments)  ?  Unknown reaction per caregiver  ? ? ?Family History  ?Family history unknown: Yes  ? ? ?Prior to Admission medications   ?Medication Sig Start Date End Date Taking? Authorizing Provider  ?acetaminophen (TYLENOL) 325 MG tablet Take 650 mg by mouth every 6 (six) hours as needed. For pain    [provider]  ?baclofen (LIORESAL) 20 MG tablet Take 1 tablet (20 mg total) by mouth 4 (four) times daily. 05/27/19   Marcial Pacas, MD  ?calcium carbonate (TUMS - DOSED IN MG ELEMENTAL CALCIUM) 500 MG chewable tablet Chew 1 tablet by mouth 3 (three) times daily.    [provider]  ?cetirizine (ZYRTEC) 10 MG tablet Take 10 mg by mouth daily.    [provider]  ?cholecalciferol (VITAMIN D) 1000 UNITS tablet Take 1,000 Units by mouth 2 (two) times daily.    [provider]  ?cyclobenzaprine (FLEXERIL) 10 MG tablet 1 at 8am and 12pm and 2 tabs at 8pm. 05/27/19   Marcial Pacas, MD  ?dextromethorphan (DELSYM) 30 MG/5ML liquid Take 30 mg by mouth as needed for cough.    [provider]  ?diazepam (VALIUM) 2 MG tablet Take 1 tablet (2 mg total) by mouth 2 (two) times daily. Must last 30 days. 05/27/19   Marcial Pacas, MD  ?diphenhydrAMINE (BENADRYL) 25 MG tablet Take 25 mg by mouth  every 6 (six) hours as needed for allergies.    [provider]  ?fluticasone (VERAMYST) 27.5 MCG/SPRAY nasal spray Place 1 spray into the nose at bedtime as needed for rhinitis.    [provider]  ?furosemide (LASIX) 20 MG tablet Take 20 mg by mouth daily as needed for fluid (leg edema).    [provider]  ?glycerin adult 2 g suppository Place 1 suppository rectally as needed for constipation.    [provider]  ?hydrochlorothiazide (HYDRODIURIL) 25 MG tablet Take 12.5 mg by mouth daily.     [provider]  ?HYDROcodone-acetaminophen (NORCO/VICODIN) 5-325 MG tablet Take  1-2 tablets by mouth every 6 (six) hours as needed. 05/10/19   Petrucelli, Aldona Bar R, PA-C  ?loperamide (IMODIUM) 2 MG capsule Take 2 mg by mouth 3 (three) times daily as needed for diarrhea or loose stools. Reported on 05/11/2015    [provider]  ?metoprolol succinate (TOPROL-XL) 50 MG 24 hr tablet Take 1 tablet (50 mg total) by mouth daily. ?Patient taking differently: Take 100 mg by mouth daily.  02/26/19   Earnstine Regal, PA-C  ?mineral oil enema Place 1 enema rectally as needed for severe constipation.    [provider]  ?Multiple Vitamin (MULITIVITAMIN WITH MINERALS) TABS Take 1 tablet by mouth daily.    [provider]  ?nitrofurantoin (MACRODANTIN) 50 MG capsule Take 50 mg by mouth daily.    [provider]  ?ondansetron (ZOFRAN ODT) 4 MG disintegrating tablet Take 1 tablet (4 mg total) by mouth every 8 (eight) hours as needed for nausea or vomiting. 05/10/19   Petrucelli, Glynda Jaeger, PA-C  ?Polyethylene Glycol 3350 (PEG 3350) POWD Take 17 g by mouth daily as needed for constipation.    [provider]  ?psyllium (HYDROCIL/METAMUCIL) 95 % PACK Give him one dose daily as directed on the package.  This is to help with his constipation.  You can buy this over the counter at any drug store. 02/25/19   Earnstine Regal, PA-C  ?senna-docusate (SENOKOT-S) 8.6-50 MG tablet Take 3 tablets by mouth at bedtime.    [provider]  ?Stannous Fluoride (GEL-KAM) 0.4 % GEL Place 1 application onto teeth daily.    [provider]  ?tiZANidine (ZANAFLEX) 2 MG tablet TAKE 1 TABLET BY MOUTH TWICE A DAY;TAKE 2 TABLETS ('4MG'$ ) BY MOUTH AT 12PM (DAY PROGRAM) 05/03/20   Marcial Pacas, MD  ?traMADol (ULTRAM) 50 MG tablet Take 100 mg by mouth every 6 (six) hours as needed for moderate pain.    [provider]  ?warfarin (COUMADIN) 3 MG tablet Take 3 mg by mouth daily at 6 PM.     [provider]  ? ? ?Physical Exam: ?Vitals:  ? 05/14/21 0630 05/14/21 0645  05/14/21 0700 05/14/21 0715  ?BP: 126/70 124/70 124/70   ?Pulse: 93 (!) 105 98 96  ?Resp:  16 (!) 34 20  ?Temp:      ?TempSrc:      ?SpO2: 100% 100% 100% 100%  ? ?General: 41 y.o. male resting in bed in NAD ?Eyes: PERRL, normal sclera ?ENMT: Nares patent w/o discharge, orophaynx clear, dentition normal, ears w/o discharge/lesions/ulcers ?Neck: Supple, trachea midline ?Cardiovascular: tachy, +S1, S2, no m/g/r, equal pulses throughout ?Respiratory: CTABL, no w/r/r, normal WOB ?GI: BS+, mild distention, soft, TTP LUQ, no masses noted, no organomegaly noted ?MSK: No e/c/c ?Neuro: A&O x 3, left wrist contracture, cerebral palsy ?Psyc: Appropriate interaction and affect, calm/cooperative ? ?Data Reviewed: ? ?Na+ 141 ?BUN  12 ?SCr 0.90 ?Lactic acid 2.9 -> 2.8 ?WBC 9.9 ? ?CT ab/pelvis w/ con: ?1. Examination is positive for small bowel obstruction. Transition to decreased caliber small bowel is noted within the central abdomen. ?2. 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 There is distal reconstitution of the left common femoral vein which appears dilated. ?3. Gallstones. ?4. Moderate stool burden identified within the sigmoid colon and rectum. ? ?Assessment and Plan: ?No notes have been filed under this hospital service. ?Service: Hospitalist ?SBO ?    - admit to inpt, tele ?    - start SBO protocol ?    - fluids, NPO ?    - remainder defer to surgery ? ?Elevated lactic acid ?    - fluids ?    - check bld Cx; follow ? ?Chronic constipation ?    - bowel regimen ? ?HTN ?    - Npo right now, will have PRN meds available, resume home meds when off NPO status ? ?Paroxysmal Afib ?    - on coumadin ?    - start heparin gtt as his INR becomes subtherapeutic; pharm consulted ? ?Spasticity d/t cerebral palsy ?    - continue home regimen when off NPO status ? ? Advance Care Planning:   Code Status: FULL ? ?Consults: General Surgery ? ?Family  Communication: w/ parents by phone ? ?Severity of Illness: ?The appropriate patient status for this patient is INPATIENT. Inpatient status is judged to be reasonable and necessary in order to provide the r

## 2021-05-14 NOTE — Consult Note (Addendum)
? ? ? ?[name] ?[birth year]  ?[id number].   ? ?Requesting MD: Steva Colder, PA-C ?Chief Complaint/Reason for Consult: SBO ? ?HPI:  ?[name] is a 41 y/o M with MMP including but not limited to Cerebral palsy, SBO, and DVT on warfarin who presented to the ED with cc abdominal pain and nausea. Pain developed in the last 24 hours. Denies aggravating or alleviating factors. He is belching but denies emesis. States he has had flatus in the ED but his last Bm was 2 days ago. At baseline he reports BMs every other day and takes laxatives. Denies fever, chills, chest pain, SOB. Denies tobacco or alcohol use. Foley bag w/ clear dark yellow urine .Past abdominal surgeries include exploratory laparotomy, LOA 2019 for SBO.  ? ?ROS: As above ?Review of Systems  ?Constitutional: Negative.   ?HENT: Negative.    ?Eyes: Negative.   ?Respiratory: Negative.    ?Cardiovascular: Negative.   ?Gastrointestinal:  Positive for abdominal pain, constipation and nausea.  ?Genitourinary: Negative.   ?Musculoskeletal: Negative.   ?Skin: Negative.   ?All other systems reviewed and are negative. ? ?Family History  ?Family history unknown: Yes  ? ? ?Past Medical History:  ?Diagnosis Date  ? Cerebral palsy ([place])   ? Chronic constipation   ? Coagulopathy ([place])   ? DVT (deep venous thrombosis) ([place])   ? Hypertension   ? Insomnia   ? Recurrent UTI   ? Tachycardia   ? ? ?Past Surgical History:  ?Procedure Laterality Date  ? ELBOW SURGERY  09/12/10  ? left  ? INCISE AND DRAIN ABCESS  09/11/10  ? LAPAROTOMY N/A 12/15/2017  ? Procedure: EXPLORATORY LAPAROTOMY;  Surgeon: Kinsinger, Arta Bruce, MD;  Location: [place];  Service: General;  Laterality: N/A;  ? SMALL INTESTINE SURGERY    ? TOOTH EXTRACTION  11/22/2011  ? Procedure: DENTAL RESTORATION/EXTRACTIONS;  Surgeon: Marcelo Baldy, MD;  Location: [place];  Service: Dentistry;  Laterality: N/A;  dental restoration and cleaning; NO EXTRACTIONS  ? ? ?Social History:  reports that he has never smoked.  He has never used smokeless tobacco. He reports that he does not drink alcohol and does not use drugs. ? ?Allergies:  ?Allergies  ?Allergen Reactions  ? Betadine [Povidone Iodine] Other (See Comments)  ?  Unknown reaction per caregiver  ? Ciprofloxacin Other (See Comments)  ?  Unknown reaction per caregiver  ? Fentanyl Other (See Comments)  ?  Previous reaction resulting in intubation, unclear details per family  ? Morphine Other (See Comments)  ?  Unknown reaction per caregiver  ? ? ?(Not in a hospital admission) ? ? ? ?Physical Exam: ?Blood pressure 124/70, pulse (!) 102, temperature (!) 96.7 ?F (35.9 ?C), temperature source Axillary, resp. rate 20, SpO2 98 %. ?General: Pleasant white chronically ill appearing male laying in bed in NAD ?HEENT: head -normocephalic, atraumatic; Eyes: PERRLA, no conjunctival injection, poor dentition ?Neck- Trachea is midline, no thyromegaly or JVD appreciated.  ?CV- RRR ?Pulm- breathing is non-labored ORA ?Abd- soft,  protuberant and tympanic, non-tender, no peritonitis, hypoactive BS, previous laparotomy scar noted. ?GU-foley bag on ankle w/ dark yellow clear urine ?MSK-  upper and lower extremity deformities appear chronic, mild lower extremity edema ?Psych- Alert and Oriented x3 with appropriate affect ?Skin: warm and dry, no rashes or lesions ? ? ?Results for orders placed or performed during the hospital encounter of 05/14/21 (from the past 48 hour(s))  ?Comprehensive metabolic panel     Status: Abnormal  ? Collection Time: 05/14/21  6:13 AM  ?Result Value Ref Range  ? Sodium 141 135 - 145 mmol/L  ? Potassium 3.9 3.5 - 5.1 mmol/L  ? Chloride 101 98 - 111 mmol/L  ? CO2 32 22 - 32 mmol/L  ? Glucose, Bld 109 (H) 70 - 99 mg/dL  ?  Comment: Glucose reference range applies only to samples taken after fasting for at least 8 hours.  ? BUN 12 6 - 20 mg/dL  ? Creatinine, Ser 0.90 0.61 - 1.24 mg/dL  ? Calcium 9.2 8.9 - 10.3 mg/dL  ? Total Protein 6.7 6.5 - 8.1 g/dL  ? Albumin 3.8 3.5 -  5.0 g/dL  ? AST 23 15 - 41 U/L  ? ALT 21 0 - 44 U/L  ? Alkaline Phosphatase 63 38 - 126 U/L  ? Total Bilirubin 1.2 0.3 - 1.2 mg/dL  ? GFR, Estimated >60 >60 mL/min  ?  Comment: (NOTE) ?Calculated using the CKD-EPI Creatinine Equation (2021) ?  ? Anion gap 8 5 - 15  ?  Comment: Performed at [facility], [place] [address], [place], [place] [address]  ?Lipase, blood     Status: None  ? Collection Time: 05/14/21  6:13 AM  ?Result Value Ref Range  ? Lipase 25 11 - 51 U/L  ?  Comment: Performed at [facility], [place] [address], [place], [place] [address]  ?CBC with Differential     Status: Abnormal  ? Collection Time: 05/14/21  6:13 AM  ?Result Value Ref Range  ? WBC 9.9 4.0 - 10.5 K/uL  ? RBC 4.88 4.22 - 5.81 MIL/uL  ? Hemoglobin 15.0 13.0 - 17.0 g/dL  ? HCT 44.4 39.0 - 52.0 %  ? MCV 91.0 80.0 - 100.0 fL  ? MCH 30.7 26.0 - 34.0 pg  ? MCHC 33.8 30.0 - 36.0 g/dL  ? RDW 14.2 11.5 - 15.5 %  ? Platelets 224 150 - 400 K/uL  ? nRBC 0.0 0.0 - 0.2 %  ? Neutrophils Relative % 79 %  ? Neutro Abs 7.8 (H) 1.7 - 7.7 K/uL  ? Lymphocytes Relative 12 %  ? Lymphs Abs 1.2 0.7 - 4.0 K/uL  ? Monocytes Relative 8 %  ? Monocytes Absolute 0.8 0.1 - 1.0 K/uL  ? Eosinophils Relative 1 %  ? Eosinophils Absolute 0.1 0.0 - 0.5 K/uL  ? Basophils Relative 0 %  ? Basophils Absolute 0.0 0.0 - 0.1 K/uL  ? Immature Granulocytes 0 %  ? Abs Immature Granulocytes 0.02 0.00 - 0.07 K/uL  ?  Comment: Performed at [facility], [place] [address], [place], [place] [address]  ?Lactic acid, plasma     Status: Abnormal  ? Collection Time: 05/14/21  6:13 AM  ?Result Value Ref Range  ? Lactic Acid, Venous 2.9 (HH) 0.5 - 1.9 mmol/L  ?  Comment: CRITICAL RESULT CALLED TO, READ BACK BY AND VERIFIED WITH: ?[facility]. RN AT [phone] 05/14/21 MULLINS,T ?Performed at [facility], [place] [address] [place], [place] [address] ?  ?Protime-INR     Status: Abnormal  ? Collection Time: 05/14/21  6:13 AM  ?Result  Value Ref Range  ? Prothrombin Time 25.5 (H) 11.4 - 15.2 seconds  ? INR 2.4 (H) 0.8 - 1.2  ?  Comment: (NOTE) ?INR goal varies based on device and disease states. ?Performed at [facility], [place] [address] ?[place], [place] [address] ?  ?Urinalysis, Routine w reflex microscopic Urine, Clean Catch     Status:  Abnormal  ? Collection Time: 05/14/21  8:11 AM  ?Result Value Ref Range  ? Color, Urine YELLOW YELLOW  ? APPearance HAZY (A) CLEAR  ? Specific Gravity, Urine 1.035 (H) 1.005 - 1.030  ? pH 8.0 5.0 - 8.0  ? Glucose, UA NEGATIVE NEGATIVE mg/dL  ? Hgb urine dipstick NEGATIVE NEGATIVE  ? Bilirubin Urine NEGATIVE NEGATIVE  ? Ketones, ur 20 (A) NEGATIVE mg/dL  ? Protein, ur 30 (A) NEGATIVE mg/dL  ? Nitrite POSITIVE (A) NEGATIVE  ? Leukocytes,Ua NEGATIVE NEGATIVE  ? RBC / HPF 0-5 0 - 5 RBC/hpf  ? WBC, UA 0-5 0 - 5 WBC/hpf  ? Bacteria, UA FEW (A) NONE SEEN  ? Squamous Epithelial / LPF 0-5 0 - 5  ? Mucus PRESENT   ?  Comment: Performed at [facility], [place] [address] [place], [place] [address]  ?Lactic acid, plasma     Status: Abnormal  ? Collection Time: 05/14/21  8:13 AM  ?Result Value Ref Range  ? Lactic Acid, Venous 2.8 (HH) 0.5 - 1.9 mmol/L  ?  Comment: CRITICAL VALUE NOTED.  VALUE IS CONSISTENT WITH PREVIOUSLY REPORTED AND CALLED VALUE. ?Performed at [facility], [place] [address] [place], [place] [address] ?  ? ?CT ABDOMEN PELVIS W CONTRAST ? ?Result Date: 05/14/2021 ?CLINICAL DATA:  History of cerebral palsy. Complains of abdominal pain. Concern for bowel obstruction. EXAM: CT ABDOMEN AND PELVIS WITH CONTRAST TECHNIQUE: Multidetector CT imaging of the abdomen and pelvis was performed using the standard protocol following bolus administration of intravenous contrast. RADIATION DOSE REDUCTION: This exam was performed according to the departmental dose-optimization program which includes automated exposure control, adjustment of the mA and/or kV according to  patient size and/or use of iterative reconstruction technique. CONTRAST:  13m OMNIPAQUE IOHEXOL 300 MG/ML  SOLN COMPARISON:  05/10/2019 FINDINGS: Lower chest: No acute abnormality. Hepatobiliary: Too small

## 2021-05-14 NOTE — ED Notes (Signed)
Updated pt's father Ronnel Zuercher via caregivers phone. Informed pending CT scan.  ?

## 2021-05-14 NOTE — ED Provider Notes (Signed)
Plan per previous PA-C, Benedetto Goad at time of sign out: pending labs and CT abdomen pelvis. Pt was given IVF and Zofran. No pain meds given due to patients multiple allergies requiring intubation. Pt is on chronic anticoagulation (coumadin) due to history of DVT. ? ?HPI per previous PA-C note: [name] is a 41 y.o. male with a history of cerebral palsy, DVT, hypertension, chronic constipation and recurrent UTIs, with history of multiple small bowel obstructions, who presents to the emergency department for evaluation of abdominal pain.  Patient reports abdominal pain developed overnight.  He reports diffuse abdominal pain that feels similar to his prior bowel obstructions.  He reports he has been very nauseated but has not vomited.  Last bowel movement was 2 days ago, he thinks he has been passing some gas.  No fevers or chills.  No chest pain.  Reports normal output clear. ? ?Clinical Course as of 05/14/21 1331  ?Mon May 14, 2021  ?9357 Discussed with patient regarding updated treatment plan of pending labs, CT scan, and consult with General surgery. Answered all available questions. Pt notes that he is due for his home dose of valium. RN confirmed dosing, ordered Valium IV.  [SB]  ?0177 Lactic Acid, Venous(!!): 2.9 ?Pt receiving IVF that was initiated after blood drawn. Will recheck lactic and reassess fluid resuscitation needs. [SB]  ?0805 Consult with General Surgery, PA-C Melina Modena who recommends NG tube placement, admission to medicine, and will evaluate patient in the ED. [SB]  ?0856 Consult with hospitalist, Dr. Marylyn Ishihara who notes that they will evaluate the patient in the ED [SB]  ?  ?Clinical Course User Index ?[SB] Jarome Trull A, PA-C  ? ?Additional history obtained:  ?External records from outside source obtained and reviewed including: History of recurrent SBO, small bowel resection completed in 2000 by Dr. Lindon Romp and 12/15/2017 with Dr. Gurney Maxin. Pt was admitted to the hospital for SBO  and ischemic bowel disease in 2019. At that time, he had an exploratory laparotomy completed by [name] due to elevated lactic despite IVF. Pt was admitted to the hospital with a SBO in 2021 and at that it was treated conservatively with NG tube.  ? ?Labs:  ?I personally interpreted labs that were ordered by previous PA-C.  The pertinent results include:  ?CBC unremarkable ?Lactic acid elevated at 2.9, patient receiving IVF at this time. ?Protime-INR, protime at 25.5, INR at 2.4 ?Lipase at 25 and unremarkable ?CMP with mildly elevated glucose at 109, otherwise unremarkable ?Urinalysis nitrite positive without leukocytosis ? ?Imaging: ?I independently visualized and interpreted imaging that were ordered by previous PA-C prior to signout.   ?Imaging studies included: Abdominal xray and CT abdomen pelvis  ?Dilated stomach and small bowel suspicious for acute small bowel  ?obstruction, despite some gas throughout the colon. Severe ileus  ?felt less likely.  ? ?CT abdomen pelvis w contrast showed:  ?1. Examination is positive for small bowel obstruction. Transition  ?to decreased caliber small bowel is noted within the central  ?abdomen.  ?2. Chronic occlusion of the left common iliac and external iliac  ?veins with associated venous collaterals overlying the left thigh.  ?There is distal reconstitution of the left common femoral vein which  ?appears dilated.  ?3. Gallstones.  ?4. Moderate stool burden identified within the sigmoid colon and  ?rectum.  ?  ?I agree with the radiologist interpretation ? ?Medications:  ?Medications ordered by previous PA-C prior to signout included: IVF and zofran for symptom management. ?Reevaluation of  the patient after these medicines showed that the patient improved ?I have reviewed the patients home medicines and have made adjustments as needed ? ?Consultations: ?I requested consultation with General Surgery, Dr. Johney Maine and discussed lab and imaging findings as well as pertinent  plan - they recommend: NG tube placement in the ED. Also recommend medical admission. ?I requested consultation with the hospitalist, Dr. Marylyn Ishihara,  and discussed lab and imaging findings as well as pertinent plan - they recommend: will evaluate patient in the ED for admission. ? ?Disposition: ?Pt presentation suspicious for small bowel obstruction. After consideration of the diagnostic results and the patients response to treatment, I feel that the patent would benefit from admission to the hospital. Case discussed with Attending who agrees with admission. Discussed with patient at bedside who is agreeable to admission. Pt appears safe for admission.  ? ?This chart was dictated using voice recognition software, Dragon. Despite the best efforts of this provider to proofread and correct errors, errors may still occur which can change documentation meaning. ?  ?Eulalie Speights A, PA-C ?05/14/21 1334 ? ?  ?Sherwood Gambler, MD ?05/15/21 1613 ? ?

## 2021-05-14 NOTE — ED Notes (Signed)
Unable to draw blood cultures at this time d/t pt being a difficult stick. ?

## 2021-05-14 NOTE — ED Triage Notes (Signed)
Pt BIB [facility] c/o abdominal pain throughout the night. Pt states abdominal pain feels like a bowel obstruction. Last BM 2 days ago. C/o nausea without vomiting. Has chronic foley, HX Cerebral Palsy. Caregiver at home.  ?

## 2021-05-15 ENCOUNTER — Inpatient Hospital Stay (HOSPITAL_COMMUNITY): Payer: Medicare Other

## 2021-05-15 DIAGNOSIS — K56609 Unspecified intestinal obstruction, unspecified as to partial versus complete obstruction: Secondary | ICD-10-CM | POA: Diagnosis not present

## 2021-05-15 LAB — COMPREHENSIVE METABOLIC PANEL
ALT: 17 U/L (ref 0–44)
AST: 19 U/L (ref 15–41)
Albumin: 3.5 g/dL (ref 3.5–5.0)
Alkaline Phosphatase: 57 U/L (ref 38–126)
Anion gap: 9 (ref 5–15)
BUN: 15 mg/dL (ref 6–20)
CO2: 26 mmol/L (ref 22–32)
Calcium: 8.4 mg/dL — ABNORMAL LOW (ref 8.9–10.3)
Chloride: 107 mmol/L (ref 98–111)
Creatinine, Ser: 0.77 mg/dL (ref 0.61–1.24)
GFR, Estimated: >60 mL/min (ref >60)
Glucose, Bld: 90 mg/dL (ref 70–99)
Potassium: 3.8 mmol/L (ref 3.5–5.1)
Sodium: 142 mmol/L (ref 135–145)
Total Bilirubin: 1.6 mg/dL — ABNORMAL HIGH (ref 0.3–1.2)
Total Protein: 6.2 g/dL — ABNORMAL LOW (ref 6.5–8.1)

## 2021-05-15 LAB — CBC
HCT: 41.7 % (ref 39.0–52.0)
Hemoglobin: 13.8 g/dL (ref 13.0–17.0)
MCH: 30.1 pg (ref 26.0–34.0)
MCHC: 33.1 g/dL (ref 30.0–36.0)
MCV: 90.8 fL (ref 80.0–100.0)
Platelets: 165 10*3/uL (ref 150–400)
RBC: 4.59 MIL/uL (ref 4.22–5.81)
RDW: 14.5 % (ref 11.5–15.5)
WBC: 8.2 10*3/uL (ref 4.0–10.5)
nRBC: 0 % (ref 0.0–0.2)

## 2021-05-15 LAB — LACTIC ACID, PLASMA: Lactic Acid, Venous: 2 mmol/L (ref 0.5–1.9)

## 2021-05-15 LAB — PROTIME-INR
INR: 2.4 — ABNORMAL HIGH (ref 0.8–1.2)
Prothrombin Time: 25.9 seconds — ABNORMAL HIGH (ref 11.4–15.2)

## 2021-05-15 LAB — HIV ANTIBODY (ROUTINE TESTING W REFLEX): HIV Screen 4th Generation wRfx: NONREACTIVE

## 2021-05-15 MED ORDER — METOPROLOL TARTRATE 5 MG/5ML IV SOLN
5.0000 mg | Freq: Four times a day (QID) | INTRAVENOUS | Status: DC
  Administered 2021-05-15: 5 mg via INTRAVENOUS
  Filled 2021-05-15: qty 5

## 2021-05-15 MED ORDER — SODIUM CHLORIDE 0.9 % IV BOLUS
1000.0000 mL | Freq: Once | INTRAVENOUS | Status: AC
  Administered 2021-05-15: 1000 mL via INTRAVENOUS

## 2021-05-15 MED ORDER — SODIUM CHLORIDE 0.9 % IV SOLN
INTRAVENOUS | Status: DC
Start: 2021-05-15 — End: 2021-05-17

## 2021-05-15 MED ORDER — METOPROLOL TARTRATE 50 MG PO TABS
50.0000 mg | ORAL_TABLET | Freq: Two times a day (BID) | ORAL | Status: DC
  Administered 2021-05-15 – 2021-05-18 (×6): 50 mg via ORAL
  Filled 2021-05-15 (×6): qty 1

## 2021-05-15 MED ORDER — METOPROLOL TARTRATE 5 MG/5ML IV SOLN: 5.0000 mg | Freq: Four times a day (QID) | INTRAVENOUS | Status: DC | PRN

## 2021-05-15 MED ORDER — METOPROLOL TARTRATE 5 MG/5ML IV SOLN
5.0000 mg | Freq: Once | INTRAVENOUS | Status: AC | PRN
  Administered 2021-05-15: 5 mg via INTRAVENOUS
  Filled 2021-05-15: qty 5

## 2021-05-15 MED ORDER — SODIUM CHLORIDE 0.9 % IV BOLUS: 500.0000 mL | Freq: Once | INTRAVENOUS | Status: DC

## 2021-05-15 MED ORDER — DIAZEPAM 2 MG PO TABS: 2.0000 mg | ORAL_TABLET | Freq: Two times a day (BID) | ORAL | Status: DC

## 2021-05-15 MED ORDER — BACLOFEN 20 MG PO TABS
20.0000 mg | ORAL_TABLET | Freq: Four times a day (QID) | ORAL | Status: DC
Start: 2021-05-16 — End: 2021-05-18
  Administered 2021-05-16 – 2021-05-18 (×8): 20 mg via ORAL
  Filled 2021-05-15 (×8): qty 1

## 2021-05-15 NOTE — Progress Notes (Signed)
[facility] ?Progress Note ? ?   ?Subjective: ?CC:  ?States he is hungry. Wants NG out. Having flatus. Denies BM. ?NG with clear/bilious contents - 400 cc in cannister during my exam. ? ?Objective: ?Vital signs in last 24 hours: ?Temp:  [97.7 ?F (36.5 ?C)-98.6 ?F (37 ?C)] 98.4 ?F (36.9 ?C) (04/18 4970) ?Pulse Rate:  [108-144] 144 (04/18 0620) ?Resp:  [18-20] 20 (04/18 2637) ?BP: (107-131)/(63-89) 124/63 (04/18 8588) ?SpO2:  [93 %-100 %] 96 % (04/18 0620) ?  ? ?Intake/Output from previous day: ?04/17 0701 - 04/18 0700 ?In: 0  ?Out: 500 [Urine:350; Emesis/NG output:150] ?Intake/Output this shift: ?No intake/output data recorded. ? ?PE: ?Gen:  Alert, NAD, pleasant, chronically ill appearing ?Card:  irregularly irregular - 130-140bpm  ?Pulm:  slightly labored ?Abd: Soft, mild distention - improved compared to previous, nontender. NG tube was clamped during my exam at 0930. ?Skin: warm and dry, no rashes  ?Psych: A&Ox3  ? ?Lab Results:  ?Recent Labs  ?  05/14/21 ?0613 05/15/21 ?5027  ?WBC 9.9 8.2  ?HGB 15.0 13.8  ?HCT 44.4 41.7  ?PLT 224 165  ? ?BMET ?Recent Labs  ?  05/14/21 ?0613 05/15/21 ?7412  ?NA 141 142  ?K 3.9 3.8  ?CL 101 107  ?CO2 32 26  ?GLUCOSE 109* 90  ?BUN 12 15  ?CREATININE 0.90 0.77  ?CALCIUM 9.2 8.4*  ? ?PT/INR ?Recent Labs  ?  05/14/21 ?0613 05/15/21 ?8786  ?LABPROT 25.5* 25.9*  ?INR 2.4* 2.4*  ? ?CMP  ?   ?Component Value Date/Time  ? NA 142 05/15/2021 0511  ? K 3.8 05/15/2021 0511  ? CL 107 05/15/2021 0511  ? CO2 26 05/15/2021 0511  ? GLUCOSE 90 05/15/2021 0511  ? BUN 15 05/15/2021 0511  ? CREATININE 0.77 05/15/2021 0511  ? CREATININE 1.00 07/26/2015 1426  ? CALCIUM 8.4 (L) 05/15/2021 0511  ? PROT 6.2 (L) 05/15/2021 0511  ? ALBUMIN 3.5 05/15/2021 0511  ? AST 19 05/15/2021 0511  ? ALT 17 05/15/2021 0511  ? ALKPHOS 57 05/15/2021 0511  ? BILITOT 1.6 (H) 05/15/2021 0511  ? GFRNONAA >60 05/15/2021 0511  ? GFRNONAA >89 07/26/2015 1426  ? GFRAA >60 05/10/2019 1904  ? GFRAA >89 07/26/2015 1426   ? ?Lipase  ?   ?Component Value Date/Time  ? LIPASE 25 05/14/2021 7672  ? ? ? ? ? ?Studies/Results: ?CT ABDOMEN PELVIS W CONTRAST ? ?Result Date: 05/14/2021 ?CLINICAL DATA:  History of cerebral palsy. Complains of abdominal pain. Concern for bowel obstruction. EXAM: CT ABDOMEN AND PELVIS WITH CONTRAST TECHNIQUE: Multidetector CT imaging of the abdomen and pelvis was performed using the standard protocol following bolus administration of intravenous contrast. RADIATION DOSE REDUCTION: This exam was performed according to the departmental dose-optimization program which includes automated exposure control, adjustment of the mA and/or kV according to patient size and/or use of iterative reconstruction technique. CONTRAST:  163m OMNIPAQUE IOHEXOL 300 MG/ML  SOLN COMPARISON:  05/10/2019 FINDINGS: Lower chest: No acute abnormality. Hepatobiliary: Too small to characterize, 7 mm hypodensity in the dome of liver is unchanged from previous exam. Similarly there is a stable low-density structure within the posterior right lobe measuring 7 mm, image 22/2. Calcified gallstones are identified measuring up to 1.6 cm. No gallbladder wall thickening/inflammation. No signs of bile duct dilatation. Pancreas: Unremarkable. No pancreatic ductal dilatation or surrounding inflammatory changes. Spleen: Normal in size without focal abnormality. Adrenals/Urinary Tract: Adrenal glands are unremarkable. Kidneys are normal, without renal calculi, focal lesion, or hydronephrosis. Bladder is unremarkable.  Stomach/Bowel: The stomach appears distended. There are dilated proximal and mid small bowel loops with multiple air-fluid levels measuring up to 5.4 cm in diameter, image 72/2. The distal small bowel appears decreased in caliber. Transition to decreased caliber small bowel is noted within the central abdomen, image 74/2. The appendix appears surgically absent. There is a moderate stool burden identified within the sigmoid colon and rectum. No  bowel wall thickening or inflammation noted. Vascular/Lymphatic: Normal appearance of the abdominal aorta. No abdominopelvic adenopathy. The left common iliac and external iliac veins appear chronically occluded. There is associated venous collaterals overlying the left thigh with distal reconstitution of dilated left common femoral vein. This appears unchanged from the previous exam. Signs of left lower extremity venous stasis is noted with subcutaneous edema within the proximal left thigh. This is unchanged from the previous exam. Reproductive: Prostate is unremarkable. Other: No free fluid or fluid collections. No signs of pneumoperitoneum. Musculoskeletal: No acute osseous findings. IMPRESSION: 1. Examination is positive for small bowel obstruction. Transition to decreased caliber small bowel is noted within the central abdomen. 2. 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 There is distal reconstitution of the left common femoral vein which appears dilated. 3. Gallstones. 4. Moderate stool burden identified within the sigmoid colon and rectum. Electronically Signed   By: Kerby Moors M.D.   On: 05/14/2021 07:59  ? ?DG Abd Portable 1V-Small Bowel Obstruction Protocol-initial, 8 hr delay ? ?Result Date: 05/14/2021 ?CLINICAL DATA:  Small-bowel obstruction, small-bowel protocol, 8 hours postcontrast EXAM: PORTABLE ABDOMEN - 1 VIEW COMPARISON:  05/14/2021 FINDINGS: Nasogastric tube tip noted within the mid body of the stomach. Administered contrast opacifies the gastric fundus there is, however, passage of contrast into the ascending colon which appears decompressed. Multiple dilated loops of small bowel are again seen within the mid abdomen and, together, the findings are suggestive of a partial small bowel obstruction. No free intraperitoneal gas. Lamellated gallstones noted within the right upper quadrant. No organomegaly.  IMPRESSION: A portion of contrast has now passed into the colon in keeping with a partial small bowel obstruction. Cholelithiasis Electronically Signed   By: Fidela Salisbury M.D.   On: 05/14/2021 20:26  ? ?DG Abd Portable 1V ? ?Result Date: 05/14/2021 ?CLINICAL DATA:  NG tube placement Pt BIB [facility] c/o abdominal pain throughout the night. Pt states abdominal pain feels like a bowel obstruction. Last BM 2 days ago. C/o nausea without vomiting. Has chronic foley, HX Cerebral Palsy. Caregiver at.*comment was truncated*[id number] EXAM: PORTABLE ABDOMEN - 1 VIEW COMPARISON:  Radiograph 05/14/2021 FINDINGS: NG tube extends the stomach. Gas-filled loops of large or small bowel. IMPRESSION: NG tube in stomach.  Side port below GE junction. Electronically Signed   By: Suzy Bouchard M.D.   On: 05/14/2021 11:25  ? ?DG Abd Portable 1 View ? ?Result Date: 05/14/2021 ?CLINICAL DATA:  41 year old male with cerebral palsy. Abdominal pain overnight. No bowel movement in 2 days. Nausea vomiting. EXAM: PORTABLE ABDOMEN - 1 VIEW COMPARISON:  CT Abdomen and Pelvis 05/10/2019 and earlier. FINDINGS: AP view at 0537 hours. Abnormally gas distended small bowel loops and the stomach. There is some large bowel gas in the abdomen and pelvis. But small bowel in the mid abdomen is dilated approaching 5 cm. No definite pneumoperitoneum on this single view. No acute osseous abnormality identified. IMPRESSION: Dilated stomach and small bowel suspicious for acute small bowel obstruction, despite some gas throughout the colon.  Severe ileus felt less likely. Electronically Signed   By: Genevie Ann M.D.   On: 05/14/2021 06:17   ? ?Anti-infectives: ?Anti-infectives (From admission, onward)  ? ? None  ? ?  ? ? ? ?Assessment/Plan ? SBO, recurrent ?- PSH of negative ex lap, LOA 2019 by [name]  ?- CT abd/pelvis 4/17 shows SBO w/ transition point in central abdomen. ?- SBO protcocol shows contrast in colon. Persisnt small bowel dilatation. NG clamp trial  today, check residuals at 1430. Allow ice chips. KUB pending. ? ?  ?FEN - NPO, IVF, NG clamped - resume to LIWS if pt develops abd pain, increased distention, nausea, vomiting. ?VTE - SCD's, hep gtt ?ID - none r

## 2021-05-15 NOTE — Progress Notes (Signed)
?   05/15/21 1030  ?Assess: MEWS Score  ?Temp 100 ?F (37.8 ?C)  ?BP 115/79  ?Pulse Rate (!) 133  ?Resp 20  ?Level of Consciousness Alert  ?SpO2 95 %  ?O2 Device Room Air  ?Assess: MEWS Score  ?MEWS Temp 0  ?MEWS Systolic 0  ?MEWS Pulse 3  ?MEWS RR 0  ?MEWS LOC 0  ?MEWS Score 3  ?MEWS Score Color Yellow  ?Assess: if the MEWS score is Yellow or Red  ?Were vital signs taken at a resting state? Yes  ?Focused Assessment No change from prior assessment  ?Does the patient meet 2 or more of the SIRS criteria? No  ?MEWS guidelines implemented *See Row Information* No, previously yellow, continue vital signs every 4 hours  ?Treat  ?MEWS Interventions Other (Comment)  ?Pain Scale 0-10  ?Pain Score 0  ?Notify: Provider  ?Provider Name/Title Antonieta Pert, MD  ?Date Provider Notified 05/15/21  ?Time Provider Notified 1035  ?Notification Type  ?(CHAT)  ?Notification Reason Other (Comment) ?(RESPIRATIOS HIGH)  ?Provider response See new orders  ?Date of Provider Response 05/15/21  ?Time of Provider Response 0900  ?Document  ?Patient Outcome Other (Comment) ?(PT BEING MONITORED)  ?Progress note created (see row info) Yes  ?Assess: SIRS CRITERIA  ?SIRS Temperature  0  ?SIRS Pulse 1  ?SIRS Respirations  0  ?SIRS WBC 0  ?SIRS Score Sum  1  ? ? ?

## 2021-05-15 NOTE — Progress Notes (Signed)
?PROGRESS NOTE ?[name]  ZWC:[id number] DOB: 1980/04/04 DOA: 05/14/2021 ?PCP: [name], MD  ? ?Brief Narrative/Hospital Course: ?41 y.o. male with medical history significant of cerebral palsy, chronic constipation, HTN, afib on coumadin. 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  ?  ?Subjective: ?Seen and examined this morning.  Overnight heart rate in 140s.  This morning  HR in 130s patient is requesting to have NGT removed , NGT clamped by surgery this morning ? ?Assessment and Plan: ?Principal Problem: ?  SBO (small bowel obstruction) ([place]) ?Active Problems: ?  Infantile cerebral palsy ([place]) ?  Atrial fibrillation ([place]) ?  Chronic constipation ?  Hypertension ?  Spasticity ?  Lactic acidosis ? ?SBO, recurrent:No history of ex lap, LOA 2019 by Dr. [name]. ?Managed with NG tube decompression IV fluids antiemetics pain control.  NG tube clamped this morning.  Patient requesting to have NG tube removed.  Continue plan of care as per surgery team.  KUB shows stable small bowel dilatation concerning for ileus or small bowel obstruction. ? ?Infantile cerebral palsy ?Spasticity: ?continue supportive care.  Resume oral meds when able ? ?Lactic acidosis likely in the setting of dehydration.  Last lactate 2.8/17 p.m.  Continue fluid hydration repeat labs. ? ?Sinus tachycardia, in the setting of 1.  Continue metoprolol every 6 hours w/ holding parameters ?PAFib - in ST now. ?DVT hx on coumadin-currently on heparin.  Pharmacy dosing Coumadin, monitor INR ?Recent Labs  ?Lab 05/14/21 ?2353 05/15/21 ?6144  ?INR 2.4* 2.4*  ?  ?Chronic  constipation-laxatives as per surgery ?Hypertension: BP stable continue metoprolol, tachycardia. ?Recurrent UTI history, catheter in place.  Continue Foley. ? ?DVT prophylaxis: coumadin inr > 1.5 ?Code Status:   Code Status: Full Code ?Family Communication: plan of care discussed with patient at bedside. ?Patient status is: inpatient Level of care: Telemetry  ?Remains inpatient because: Ongoing management of SBO, sinus tachycardia ?Patient currently not stable ? ?Dispo: The patient is from: home ?           Anticipated disposition: home ? ?Mobility Assessment (last 72 hours)   ? ? Mobility Assessment   ?No documentation. ? ?  ?  ? ?  ?  ? ?Objective: ?Vitals last 24 hrs: ?Vitals:  ? 05/14/21 2205 05/15/21 0217 05/15/21 0620 05/15/21 1030  ?BP: 107/76 116/72 124/63 115/79  ?Pulse: (!) 121 (!) 130 (!) 144 (!) 133  ?Resp: '20 20 20 20  '$ ?Temp: 98.2 ?F (36.8 ?C) 98.6 ?F (37 ?C) 98.4 ?F (36.9 ?C) 100 ?F (37.8 ?C)  ?TempSrc: Axillary Axillary Axillary Oral  ?SpO2: 95% 93% 96% 95%  ? ?Weight change:  ? ?Physical Examination: ?General exam: Alert, awake with baseline speech difficulties, appears uneasy uncomfortable. ?HEENT:Oral mucosa DRY, Ear/Nose WNL grossly, dentition normal. ?Respiratory system: bilaterally diminished BS, no use of accessory muscle ?Cardiovascular system: S1 & S2 +, No JVD. ?Gastrointestinal system: Abdomen soft,NT,ND, BS+. NGT+ ?Nervous System:Alert, awake ?Extremities: LE edema none,distal peripheral pulses palpable.  ?Skin: No rashes,no icterus. ?MSK: Normal muscle bulk,tone, power ? ?Medications reviewed:  ?Scheduled Meds: ? metoprolol tartrate  5 mg Intravenous Q6H  ? ?Continuous Infusions: ? lactated ringers    ? lactated ringers 100 mL/hr at 05/15/21  0412  ? ?Diet Order   ? ?       ?  Diet NPO time specified Except for: Ice Chips  Diet effective now       ?  ? ?  ?  ? ?  ? ?Intake/Output Summary (Last 24 hours) at 05/15/2021 1256 ?Last data filed at 05/15/2021 0700 ?Gross per 24 hour  ?Intake 0 ml   ?Output 500 ml  ?Net -500 ml  ? ?Net IO Since Admission: -500 mL [05/15/21 1256]  ?Wt Readings from Last 3 Encounters:  ?03/19/21 81.6 kg  ?02/16/19 72 kg  ?02/14/18 133.1 kg  ?  ? ?Unresulted Labs (From admission, onward)  ? ?  Start     Ordered  ? 05/15/21 0500  Protime-INR  Daily,   R     ? 2021/06/02 1040  ? 05/15/21 0500  CBC  Daily,   R     ? 06/02/21 1040  ? ?  ?  ? ?  ?Data Reviewed: I have personally reviewed following labs and imaging studies ?CBC: ?Recent Labs  ?Lab 06-02-21 ?8315 05/15/21 ?1761  ?WBC 9.9 8.2  ?NEUTROABS 7.8*  --   ?HGB 15.0 13.8  ?HCT 44.4 41.7  ?MCV 91.0 90.8  ?PLT 224 165  ? ?Basic Metabolic Panel: ?Recent Labs  ?Lab 06/02/21 ?6073 05/15/21 ?7106  ?NA 141 142  ?K 3.9 3.8  ?CL 101 107  ?CO2 32 26  ?GLUCOSE 109* 90  ?BUN 12 15  ?CREATININE 0.90 0.77  ?CALCIUM 9.2 8.4*  ? ?GFR: ?CrCl cannot be calculated (Unknown ideal weight.). ?Liver Function Tests: ?Recent Labs  ?Lab Jun 02, 2021 ?2694 05/15/21 ?8546  ?AST 23 19  ?ALT 21 17  ?ALKPHOS 63 57  ?BILITOT 1.2 1.6*  ?PROT 6.7 6.2*  ?ALBUMIN 3.8 3.5  ? ?Recent Labs  ?Lab Jun 02, 2021 ?2703  ?LIPASE 25  ? ?No results for input(s): AMMONIA in the last 168 hours. ?Coagulation Profile: ?Recent Labs  ?Lab 02-Jun-2021 ?5009 05/15/21 ?3818  ?INR 2.4* 2.4*  ? ?BNP (last 3 results) ?No results for input(s): PROBNP in the last 8760 hours. ?HbA1C: ?No results for input(s): HGBA1C in the last 72 hours. ?CBG: ?No results for input(s): GLUCAP in the last 168 hours. ?Lipid Profile: ?No results for input(s): CHOL, HDL, LDLCALC, TRIG, CHOLHDL, LDLDIRECT in the last 72 hours. ?Thyroid Function Tests: ?No results for input(s): TSH, T4TOTAL, FREET4, T3FREE, THYROIDAB in the last 72 hours. ?Sepsis Labs: ?Recent Labs  ?Lab 2021-06-02 ?2993 06/02/2021 ?0813  ?LATICACIDVEN 2.9* 2.8*  ? ? ?Recent Results (from the past 240 hour(s))  ?Culture, blood (Routine X 2) w Reflex to ID Panel     Status: None (Preliminary result)  ? Collection Time: 2021/06/02 12:38 PM  ? Specimen: BLOOD   ?Result Value Ref Range Status  ? Specimen Description   Final  ?  BLOOD BLOOD LEFT FOREARM ?Performed at [facility], [place] [address] [place], [place] [address] ?  ? Special Requests   Final  ?  BOTTLES DRAWN AEROBIC AND ANAEROBIC Blood Culture adequate volume ?Performed at [facility], [place] [address] [place], [place] [address] ?  ? Culture   Final  ?  NO GROWTH < 24 HOURS ?Performed at [facility], [place] [address] [place], [place] [address] ?  ? Report Status PENDING  Incomplete  ?Culture, blood (Routine X 2) w Reflex to ID Panel     Status: None (Preliminary result)  ? Collection Time: Jun 02, 2021  6:20 PM  ?  Specimen: BLOOD  ?Result Value Ref Range Status  ? Specimen Description   Final  ?  BLOOD SITE NOT SPECIFIED ?Performed at [facility], [place] [address], [place], [place] [address] ?  ? Special Requests   Final  ?  IN PEDIATRIC BOTTLE Blood Culture adequate volume ?Performed at [facility], [place] [address] [place], [place] [address] ?  ? Culture   Final  ?  NO GROWTH < 24 HOURS ?Performed at [facility], [place] [address], [place], [place] [address] ?  ? Report Status PENDING  Incomplete  ?  ?Antimicrobials: ?Anti-infectives (From admission, onward)  ? ? None  ? ?  ? ?Culture/Microbiology ?   ?Component Value Date/Time  ? SDES  05/14/2021 1820  ?  BLOOD SITE NOT SPECIFIED ?Performed at [facility], [place] [address] [place], [place] [address] ?  ? SPECREQUEST  05/14/2021 1820  ?  IN PEDIATRIC BOTTLE Blood Culture adequate volume ?Performed at [facility], [place] [address], [place], [place] [address] ?  ? CULT  05/14/2021 1820  ?  NO GROWTH < 24 HOURS ?Performed at [facility], [place] [address], [place],  [address] ?  ? REPTSTATUS PENDING 05/14/2021 1820  ?Other culture-see note  ?Radiology Studies: ?CT ABDOMEN PELVIS W CONTRAST ? ?Result Date: 05/14/2021 ?CLINICAL DATA:  History of cerebral palsy.  Complains of abdominal pain. Concern for bowel obstruction. EXAM: CT ABDOMEN AND PELVIS WITH CONTRAST TECHNIQUE: Multidetector CT imaging of the abdomen and pelvis was performed using the standard protocol following bolus administra

## 2021-05-15 NOTE — Progress Notes (Signed)
Patient's HR sustaining in the 140 s now. He has order for Metoprolol but the parameter does not allow me to give it. Notified Olena Heckle NP to modify the order so I could give it.  New order for Metoprolol x 1 dose received. Will administer and continue to monitor. ?

## 2021-05-15 NOTE — Progress Notes (Signed)
ANTICOAGULATION CONSULT NOTE - Follow Up Consult ? ?Pharmacy Consult for IV heparin ?Indication: Hx of recurrent DVT ? ?Allergies  ?Allergen Reactions  ? Betadine [Povidone Iodine] Other (See Comments)  ?  Unknown reaction per caregiver  ? Ciprofloxacin Other (See Comments)  ?  Unknown reaction per caregiver  ? Fentanyl Other (See Comments)  ?  Previous reaction resulting in intubation, unclear details per family  ? Morphine Other (See Comments)  ?  Unknown reaction per caregiver  ? ? ?Patient Measurements: ?  ?Heparin Dosing Weight: 78.3 kg (Pt TBW > 125% IBW) ? ?Vital Signs: ?Temp: 98.4 ?F (36.9 ?C) (04/18 6962) ?Temp Source: Axillary (04/18 9528) ?BP: 124/63 (04/18 4132) ?Pulse Rate: 144 (04/18 0620) ? ?Labs: ?Recent Labs  ?  05/14/21 ?0613 05/15/21 ?4401  ?HGB 15.0 13.8  ?HCT 44.4 41.7  ?PLT 224 165  ?LABPROT 25.5* 25.9*  ?INR 2.4* 2.4*  ?CREATININE 0.90 0.77  ? ? ?CrCl cannot be calculated (Unknown ideal weight.). ? ? ?Medications:  ?Scheduled:  ?Infusions:  ? lactated ringers    ? lactated ringers 100 mL/hr at 05/15/21 0412  ? ?PRN: ketorolac, lactated ringers, LORazepam, metoprolol tartrate, prochlorperazine ? 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 CCS recommends supportive care at this time. INR 2.4 today. Pt is now NPO d/t SBO. Plan on starting IV heparin when INR <2. ? ?Goal of Therapy:  ?Heparin level 0.3-0.7 units/ml ?Monitor platelets by anticoagulation protocol: Yes ?  ?Plan:  ?Continue to hold heparin for now since INR remain >2 ?F/u on INR in AM and start if <2 ?Monitor daily INR and CBC ?Monitor s/sx of bleeding ? ?Thea Alken, PharmD Candidate ?05/15/2021,7:48 AM ? ? ?

## 2021-05-15 NOTE — Progress Notes (Signed)
Transition of Care (TOC) Screening Note ? ?Patient Details  ?Name: [name] ?Date of Birth: [birth year] ? ?Transition of Care (TOC) CM/SW Contact:    ?Sherie Don, LCSW ?Phone Number: ?05/15/2021, 11:42 AM ? ?Transition of Care Department [facility]) has reviewed patient and no TOC needs have been identified at this time. We will continue to monitor patient advancement through interdisciplinary progression rounds. If new patient transition needs arise, please place a TOC consult. ?

## 2021-05-15 NOTE — Progress Notes (Signed)
?   05/14/21 1805  ?Assess: MEWS Score  ?Temp 97.7 ?F (36.5 ?C)  ?BP 131/89  ?Pulse Rate (!) 117 ?(RN notified)  ?Resp 20  ?SpO2 96 %  ?O2 Device Room Air  ?Assess: MEWS Score  ?MEWS Temp 0  ?MEWS Systolic 0  ?MEWS Pulse 2  ?MEWS RR 0  ?MEWS LOC 0  ?MEWS Score 2  ?MEWS Score Color Yellow  ?Assess: if the MEWS score is Yellow or Red  ?Were vital signs taken at a resting state? Yes  ?Focused Assessment No change from prior assessment  ?Does the patient meet 2 or more of the SIRS criteria? No  ?MEWS guidelines implemented *See Row Information* Yes  ?Treat  ?MEWS Interventions Escalated (See documentation below)  ?Pain Scale 0-10  ?Pain Score 0  ?Take Vital Signs  ?Increase Vital Sign Frequency  Yellow: Q 2hr X 2 then Q 4hr X 2, if remains yellow, continue Q 4hrs  ?Escalate  ?MEWS: Escalate Yellow: discuss with charge nurse/RN and consider discussing with provider and RRT  ?Notify: Charge Nurse/RN  ?Name of Charge Nurse/RN Notified Jarrett Soho, RN  ?Date Charge Nurse/RN Notified 05/14/21  ?Time Charge Nurse/RN Notified 1806  ?Document  ?Patient Outcome Other (Comment)  ?Progress note created (see row info) Yes  ?Assess: SIRS CRITERIA  ?SIRS Temperature  0  ?SIRS Pulse 1  ?SIRS Respirations  0  ?SIRS WBC 0  ?SIRS Score Sum  1  ? ? ?

## 2021-05-15 NOTE — Hospital Course (Addendum)
41 y.o. male with medical history significant of cerebral palsy, chronic constipation, HTN, afib on coumadin. 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 patient was very uncomfortable with NG tube and wanted to be removed-NG clamp trial done and subsequently NG tube discontinued by surgery 4/18 and placed on clear liquid diethad an enema 4/19 with good bowel movement,.Diet being advanced, and tolerating diet. Cleared by surgery for discharge. He was Kept 1 additional day due to INR < 2. Now inr therapeutic and doing well. ?

## 2021-05-16 DIAGNOSIS — K56609 Unspecified intestinal obstruction, unspecified as to partial versus complete obstruction: Secondary | ICD-10-CM | POA: Diagnosis not present

## 2021-05-16 LAB — BASIC METABOLIC PANEL
Anion gap: 6 (ref 5–15)
BUN: 10 mg/dL (ref 6–20)
CO2: 25 mmol/L (ref 22–32)
Calcium: 7.7 mg/dL — ABNORMAL LOW (ref 8.9–10.3)
Chloride: 109 mmol/L (ref 98–111)
Creatinine, Ser: 0.67 mg/dL (ref 0.61–1.24)
GFR, Estimated: >60 mL/min (ref >60)
Glucose, Bld: 106 mg/dL — ABNORMAL HIGH (ref 70–99)
Potassium: 3.5 mmol/L (ref 3.5–5.1)
Sodium: 140 mmol/L (ref 135–145)

## 2021-05-16 LAB — CBC
HCT: 37.1 % — ABNORMAL LOW (ref 39.0–52.0)
Hemoglobin: 12.4 g/dL — ABNORMAL LOW (ref 13.0–17.0)
MCH: 30.8 pg (ref 26.0–34.0)
MCHC: 33.4 g/dL (ref 30.0–36.0)
MCV: 92.1 fL (ref 80.0–100.0)
Platelets: 149 10*3/uL — ABNORMAL LOW (ref 150–400)
RBC: 4.03 MIL/uL — ABNORMAL LOW (ref 4.22–5.81)
RDW: 14.6 % (ref 11.5–15.5)
WBC: 6.5 10*3/uL (ref 4.0–10.5)
nRBC: 0 % (ref 0.0–0.2)

## 2021-05-16 LAB — LACTIC ACID, PLASMA: Lactic Acid, Venous: 1.2 mmol/L (ref 0.5–1.9)

## 2021-05-16 LAB — PROTIME-INR
INR: 2.6 — ABNORMAL HIGH (ref 0.8–1.2)
Prothrombin Time: 27.2 seconds — ABNORMAL HIGH (ref 11.4–15.2)

## 2021-05-16 MED ORDER — FLEET ENEMA 7-19 GM/118ML RE ENEM
1.0000 | ENEMA | Freq: Once | RECTAL | Status: AC
Start: 2021-05-16 — End: 2021-05-16
  Administered 2021-05-16: 1 via RECTAL
  Filled 2021-05-16: qty 1

## 2021-05-16 MED ORDER — CYCLOBENZAPRINE HCL 10 MG PO TABS
10.0000 mg | ORAL_TABLET | Freq: Two times a day (BID) | ORAL | Status: DC
  Administered 2021-05-17 – 2021-05-18 (×3): 10 mg via ORAL
  Filled 2021-05-16: qty 1
  Filled 2021-05-16 (×2): qty 2

## 2021-05-16 MED ORDER — WARFARIN SODIUM 3 MG PO TABS
3.0000 mg | ORAL_TABLET | Freq: Once | ORAL | Status: AC
  Administered 2021-05-16: 3 mg via ORAL
  Filled 2021-05-16: qty 1

## 2021-05-16 MED ORDER — TIZANIDINE HCL 4 MG PO TABS
4.0000 mg | ORAL_TABLET | Freq: Every day | ORAL | Status: DC
  Administered 2021-05-16 – 2021-05-18 (×3): 4 mg via ORAL
  Filled 2021-05-16 (×3): qty 1

## 2021-05-16 MED ORDER — CYCLOBENZAPRINE HCL 10 MG PO TABS
20.0000 mg | ORAL_TABLET | ORAL | Status: DC
  Administered 2021-05-16 – 2021-05-17 (×2): 20 mg via ORAL
  Filled 2021-05-16 (×2): qty 2

## 2021-05-16 MED ORDER — WARFARIN - PHARMACIST DOSING INPATIENT
Freq: Every day | Status: DC
Start: 2021-05-16 — End: 2021-05-18

## 2021-05-16 MED ORDER — TIZANIDINE HCL 4 MG PO TABS
2.0000 mg | ORAL_TABLET | Freq: Two times a day (BID) | ORAL | Status: DC
  Administered 2021-05-16 – 2021-05-18 (×4): 2 mg via ORAL
  Filled 2021-05-16 (×4): qty 1

## 2021-05-16 MED ORDER — LORATADINE 10 MG PO TABS
10.0000 mg | ORAL_TABLET | Freq: Every day | ORAL | Status: DC
  Administered 2021-05-16 – 2021-05-18 (×3): 10 mg via ORAL
  Filled 2021-05-16 (×3): qty 1

## 2021-05-16 MED ORDER — POLYETHYLENE GLYCOL 3350 17 G PO PACK
17.0000 g | PACK | Freq: Every day | ORAL | Status: DC
  Administered 2021-05-16 – 2021-05-18 (×3): 17 g via ORAL
  Filled 2021-05-16 (×3): qty 1

## 2021-05-16 MED ORDER — HYDROCHLOROTHIAZIDE 12.5 MG PO TABS
12.5000 mg | ORAL_TABLET | Freq: Every day | ORAL | Status: DC
  Administered 2021-05-16 – 2021-05-18 (×3): 12.5 mg via ORAL
  Filled 2021-05-16 (×3): qty 1

## 2021-05-16 MED ORDER — NITROFURANTOIN MACROCRYSTAL 50 MG PO CAPS
50.0000 mg | ORAL_CAPSULE | Freq: Every day | ORAL | Status: DC
  Administered 2021-05-16 – 2021-05-18 (×3): 50 mg via ORAL
  Filled 2021-05-16 (×3): qty 1

## 2021-05-16 MED ORDER — DIAZEPAM 2 MG PO TABS
2.0000 mg | ORAL_TABLET | Freq: Two times a day (BID) | ORAL | Status: DC
Start: 2021-05-16 — End: 2021-05-18
  Administered 2021-05-16 – 2021-05-18 (×6): 2 mg via ORAL
  Filled 2021-05-16 (×6): qty 1

## 2021-05-16 NOTE — Progress Notes (Signed)
?   05/16/21 0846  ?Assess: MEWS Score  ?Temp 98.9 ?F (37.2 ?C)  ?BP 124/83  ?Pulse Rate (!) 122  ?Resp 16  ?Level of Consciousness Alert  ?SpO2 100 %  ?O2 Device Room Air  ?Assess: MEWS Score  ?MEWS Temp 0  ?MEWS Systolic 0  ?MEWS Pulse 2  ?MEWS RR 0  ?MEWS LOC 0  ?MEWS Score 2  ?MEWS Score Color Yellow  ?Assess: if the MEWS score is Yellow or Red  ?Were vital signs taken at a resting state? Yes  ?Focused Assessment No change from prior assessment  ?Does the patient meet 2 or more of the SIRS criteria? No  ?MEWS guidelines implemented *See Row Information* No, previously yellow, continue vital signs every 4 hours  ?Treat  ?Pain Scale 0-10  ?Pain Score 0  ?Escalate  ?MEWS: Escalate Yellow: discuss with charge nurse/RN and consider discussing with provider and RRT  ?Notify: Charge Nurse/RN  ?Name of Charge Nurse/RN Notified Apolonio Schneiders, RN  ?Date Charge Nurse/RN Notified 05/16/21  ?Time Charge Nurse/RN Notified [phone]  ?Document  ?Patient Outcome Other (Comment)  ?Progress note created (see row info) Yes  ?Assess: SIRS CRITERIA  ?SIRS Temperature  0  ?SIRS Pulse 1  ?SIRS Respirations  0  ?SIRS WBC 0  ?SIRS Score Sum  1  ? ? ?

## 2021-05-16 NOTE — Progress Notes (Signed)
?   05/16/21 1257  ?Assess: MEWS Score  ?Temp 99.8 ?F (37.7 ?C)  ?BP 140/73  ?Pulse Rate (!) 117  ?Resp 18  ?SpO2 98 %  ?O2 Device Room Air  ?Assess: MEWS Score  ?MEWS Temp 0  ?MEWS Systolic 0  ?MEWS Pulse 2  ?MEWS RR 0  ?MEWS LOC 0  ?MEWS Score 2  ?MEWS Score Color Yellow  ?Assess: if the MEWS score is Yellow or Red  ?Were vital signs taken at a resting state? Yes  ?Focused Assessment No change from prior assessment  ?Does the patient meet 2 or more of the SIRS criteria? No  ?MEWS guidelines implemented *See Row Information* No, previously yellow, continue vital signs every 4 hours  ?Treat  ?MEWS Interventions Other (Comment)  ?Pain Scale 0-10  ?Pain Score 0  ?Escalate  ?MEWS: Escalate Yellow: discuss with charge nurse/RN and consider discussing with provider and RRT  ?Document  ?Patient Outcome Other (Comment)  ?Progress note created (see row info) Yes  ?Assess: SIRS CRITERIA  ?SIRS Temperature  0  ?SIRS Pulse 1  ?SIRS Respirations  0  ?SIRS WBC 0  ?SIRS Score Sum  1  ? ? ?

## 2021-05-16 NOTE — Progress Notes (Signed)
ANTICOAGULATION CONSULT NOTE - Follow Up Consult ? ?Pharmacy Consult for warfarin ?Indication: Hx of recurrent DVT and afib ? ?Allergies  ?Allergen Reactions  ? Betadine [Povidone Iodine] Other (See Comments)  ?  Unknown reaction per caregiver  ? Ciprofloxacin Other (See Comments)  ?  Unknown reaction per caregiver  ? Fentanyl Other (See Comments)  ?  Previous reaction resulting in intubation, unclear details per family  ? Morphine Other (See Comments)  ?  Unknown reaction per caregiver  ? ? ?Patient Measurements: ?Heparin Dosing Weight: 78.3 kg (Pt TBW > 125% IBW) ? ?Vital Signs: ?Temp: 98.9 ?F (37.2 ?C) (04/19 7793) ?Temp Source: Oral (04/19 9030) ?BP: 124/83 (04/19 0846) ?Pulse Rate: 122 (04/19 0846) ? ?Labs: ?Recent Labs  ?  05/14/21 ?0613 05/15/21 ?0923 05/16/21 ?3007  ?HGB 15.0 13.8 12.4*  ?HCT 44.4 41.7 37.1*  ?PLT 224 165 149*  ?LABPROT 25.5* 25.9* 27.2*  ?INR 2.4* 2.4* 2.6*  ?CREATININE 0.90 0.77 0.67  ? ? ? ?CrCl cannot be calculated (Unknown ideal weight.). ? ? ?Medications:  ?Scheduled:  ? baclofen  20 mg Oral QID  ? diazepam  2 mg Oral BID  ? metoprolol tartrate  50 mg Oral BID  ? ?Infusions:  ? sodium chloride 75 mL/hr at 05/16/21 0736  ? ?PRN: ketorolac, LORazepam, metoprolol tartrate, prochlorperazine ? 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 CCS recommends supportive care at this time. INR 2.4 on 4/18. Pt has advanced to full liquid diet. Will plan to advance his diet further on 4/20 if tolerating. ? ?Today, 4/19 ?- INR 2.6 ?- Hb and plt are trending down (could be d/t hemodilution) ?- No signs of bleeding per RN ? ?Goal of Therapy:  ?- INR 2-3 ?- Monitor platelets by anticoagulation protocol: Yes ?  ?Plan:  ?- Ok to resume warfarin per discussion with MD ?- Start warfarin '3mg'$  x1 this evening   ?- Monitor daily INR and  CBC ?- Monitor s/sx of bleeding ?- Noted possible DDI with prn ketorolac, no doses given so far, continue to monitor usage ? ?Thea Alken, PharmD Candidate ?05/16/2021,10:53 AM ? ? ?

## 2021-05-16 NOTE — Plan of Care (Signed)
No acute events overnight. ?Problem: Safety: ?Goal: Non-violent Restraint(s) ?Outcome: Progressing ?  ?Problem: Education: ?Goal: Knowledge of General Education information will improve ?Description: Including pain rating scale, medication(s)/side effects and non-pharmacologic comfort measures ?Outcome: Progressing ?  ?Problem: Health Behavior/Discharge Planning: ?Goal: Ability to manage health-related needs will improve ?Outcome: Progressing ?  ?Problem: Clinical Measurements: ?Goal: Ability to maintain clinical measurements within normal limits will improve ?Outcome: Progressing ?Goal: Will remain free from infection ?Outcome: Progressing ?Goal: Diagnostic test results will improve ?Outcome: Progressing ?Goal: Respiratory complications will improve ?Outcome: Progressing ?Goal: Cardiovascular complication will be avoided ?Outcome: Progressing ?  ?Problem: Activity: ?Goal: Risk for activity intolerance will decrease ?Outcome: Progressing ?  ?Problem: Nutrition: ?Goal: Adequate nutrition will be maintained ?Outcome: Progressing ?  ?Problem: Coping: ?Goal: Level of anxiety will decrease ?Outcome: Progressing ?  ?Problem: Elimination: ?Goal: Will not experience complications related to bowel motility ?Outcome: Progressing ?Goal: Will not experience complications related to urinary retention ?Outcome: Progressing ?  ?Problem: Pain Managment: ?Goal: General experience of comfort will improve ?Outcome: Progressing ?  ?Problem: Safety: ?Goal: Ability to remain free from injury will improve ?Outcome: Progressing ?  ?Problem: Skin Integrity: ?Goal: Risk for impaired skin integrity will decrease ?Outcome: Progressing ?  ?

## 2021-05-16 NOTE — Progress Notes (Signed)
[facility] ?Progress Note ? ?   ?Subjective: ?Hungry.  No BM, but lots of flatus.  Tolerating CLD with no issues.  No nausea.  Would like an enema today as he does deal with some chronic constipation issues. ? ?Objective: ?Vital signs in last 24 hours: ?Temp:  [98.4 ?F (36.9 ?C)-99.6 ?F (37.6 ?C)] 98.9 ?F (37.2 ?C) (04/19 8185) ?Pulse Rate:  [109-134] 122 (04/19 0846) ?Resp:  [16-20] 16 (04/19 0846) ?BP: (111-126)/(72-83) 124/83 (04/19 6314) ?SpO2:  [95 %-100 %] 100 % (04/19 0846) ?Last BM Date : 05/12/21 ? ?Intake/Output from previous day: ?04/18 0701 - 04/19 0700 ?In: 2667.6 [P.O.:120; I.V.:1777.8; IV Piggyback:769.8] ?Out: -  ?Intake/Output this shift: ?Total I/O ?In: 360 [P.O.:360] ?Out: -  ? ?PE: ?Abd: Soft, mild distention but caregiver states it is much better.  Great BS, NT ? ? ?Lab Results:  ?Recent Labs  ?  05/15/21 ?9702 05/16/21 ?6378  ?WBC 8.2 6.5  ?HGB 13.8 12.4*  ?HCT 41.7 37.1*  ?PLT 165 149*  ? ?BMET ?Recent Labs  ?  05/15/21 ?5885 05/16/21 ?0277  ?NA 142 140  ?K 3.8 3.5  ?CL 107 109  ?CO2 26 25  ?GLUCOSE 90 106*  ?BUN 15 10  ?CREATININE 0.77 0.67  ?CALCIUM 8.4* 7.7*  ? ?PT/INR ?Recent Labs  ?  05/15/21 ?4128 05/16/21 ?7867  ?LABPROT 25.9* 27.2*  ?INR 2.4* 2.6*  ? ?CMP  ?   ?Component Value Date/Time  ? NA 140 05/16/2021 0536  ? K 3.5 05/16/2021 0536  ? CL 109 05/16/2021 0536  ? CO2 25 05/16/2021 0536  ? GLUCOSE 106 (H) 05/16/2021 0536  ? BUN 10 05/16/2021 0536  ? CREATININE 0.67 05/16/2021 0536  ? CREATININE 1.00 07/26/2015 1426  ? CALCIUM 7.7 (L) 05/16/2021 0536  ? PROT 6.2 (L) 05/15/2021 0511  ? ALBUMIN 3.5 05/15/2021 0511  ? AST 19 05/15/2021 0511  ? ALT 17 05/15/2021 0511  ? ALKPHOS 57 05/15/2021 0511  ? BILITOT 1.6 (H) 05/15/2021 0511  ? GFRNONAA >60 05/16/2021 0536  ? GFRNONAA >89 07/26/2015 1426  ? GFRAA >60 05/10/2019 1904  ? GFRAA >89 07/26/2015 1426  ? ?Lipase  ?   ?Component Value Date/Time  ? LIPASE 25 05/14/2021 6720  ? ? ? ? ? ?Studies/Results: ?DG Abd Portable  1V ? ?Result Date: 05/15/2021 ?CLINICAL DATA:  Small bowel obstruction. EXAM: PORTABLE ABDOMEN - 1 VIEW COMPARISON:  May 14, 2021. FINDINGS: Stable small bowel dilatation is noted concerning for ileus or small-bowel obstruction. Residual contrast is seen in the colon. Phleboliths are noted in the pelvis. IMPRESSION: Stable small bowel dilatation is noted concerning for ileus or small-bowel obstruction. Electronically Signed   By: Marijo Conception M.D.   On: 05/15/2021 11:36  ? ?DG Abd Portable 1V-Small Bowel Obstruction Protocol-initial, 8 hr delay ? ?Result Date: 05/14/2021 ?CLINICAL DATA:  Small-bowel obstruction, small-bowel protocol, 8 hours postcontrast EXAM: PORTABLE ABDOMEN - 1 VIEW COMPARISON:  05/14/2021 FINDINGS: Nasogastric tube tip noted within the mid body of the stomach. Administered contrast opacifies the gastric fundus there is, however, passage of contrast into the ascending colon which appears decompressed. Multiple dilated loops of small bowel are again seen within the mid abdomen and, together, the findings are suggestive of a partial small bowel obstruction. No free intraperitoneal gas. Lamellated gallstones noted within the right upper quadrant. No organomegaly. IMPRESSION: A portion of contrast has now passed into the colon in keeping with a partial small bowel obstruction. Cholelithiasis Electronically Signed   By: Cassandria Anger  Christa See M.D.   On: 05/14/2021 20:26   ? ?Anti-infectives: ?Anti-infectives (From admission, onward)  ? ? None  ? ?  ? ? ? ?Assessment/Plan ? SBO, recurrent ?-resolving ?-Adv to FLD, soft in am if tolerates today ?-fleets enema, daily miralax ?  ?FEN - FLD ?VTE - SCD's, hep gtt ?ID - none recommended from a surgical perspective  ? ?  ?Below per TRH ?Cerebral Palsy ?Chronic constipation - stool burden on CT. 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 LOS: 2 days  ? ?I reviewed nursing notes, hospitalist notes, last 24 h vitals and pain scores, last 48 h intake and output, last 24 h labs and trends, and last 24 h imaging results. ? ? ? ?Obie Dredge, PA-C ?[facility] ?Please see Amion for pager number during day hours 7:00am-4:30pm ? ? ? ? ? ? ?

## 2021-05-16 NOTE — Progress Notes (Signed)
?PROGRESS NOTE ?[name]  [id number]:[id number] DOB: May 09, 1980 DOA: 05/14/2021 ?PCP: [name], MD  ? ?Brief Narrative/Hospital Course: ?41 y.o. male with medical history significant of cerebral palsy, chronic constipation, HTN, afib on coumadin. 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 patient was very uncomfortable with NG tube and wanted to be removed-NG clamp trial done and subsequently NG tube discontinued by surgery 4/18 and placed on clear liquid diet. ?   ?Subjective: ?Seen this morning patient reports he feels better able to communicate some with baseline cognitive impairment, heart rate much better. ?Reports no BM but passing flatus no nausea vomiting. ?Overnight his labs remains a stable lactic acid downtrending, repeat ordered to ensure clearance heart rate has been improving in the 110s, on room air BP stable respiration 18-20 ?On CLD ? ?Assessment and Plan: ?Principal Problem: ?  SBO (small bowel obstruction) ([place]) ?Active Problems: ?  Infantile cerebral palsy ([place]) ?  Atrial fibrillation ([place]) ?  Chronic constipation ?  Hypertension ?  Spasticity ?  Lactic acidosis ? ?SBO, recurrent:No history of ex lap, LOA 2019 by Dr. [name]. S/p NG tube decompression-after NG clamp trial removed 4/18, on clear liquid diet-passing flatus.  Continue gentle IV fluid hydration antiemetics, pain management, continue plan as per surgical team, appreciate input ? ?Infantile cerebral palsy ?Spasticity: ?continue supportive care and resume patient's baclofen ? ?Lactic acidosis likely in the setting of dehydration.  Lactic acid is downtrending.  Resolved on  repeat labs this morning.  ? ?Sinus tachycardia, in the setting of #1/dehydration. Improving nicely, continue metoprolol continue gentle hydration while oral intake is inadequate. ? ?PAFib - in ST now. On metoprolol, coumadin ?DVT hx on coumadin-INR remains therapeutic, start heparin if  subtherapeutic.Pharmacy dosing Coumadin, monitor INR ?Recent Labs  ?Lab 05/14/21 ?4742 05/15/21 ?5956 05/16/21 ?3875  ?INR 2.4* 2.4* 2.6*  ?  ?Chronic constipation- cont laxatives as per surgery ?Hypertension: BP stable  cont metoprolol ?Recurrent UTI history, catheter in place.  Continue Foley. ? ?DVT prophylaxis: coumadin inr > 1.5 ?Code Status:   Code Status: Full Code ?Family Communication: plan of care discussed with patient at bedside. ?Patient status is: inpatient Level of care: Telemetry  ?Remains inpatient because: Ongoing management of SBO, sinus tachycardia ?Patient currently not stable ? ?Dispo: The patient is from: home ?           Anticipated disposition: home ? ?Mobility Assessment (last 72 hours)   ? ? Mobility Assessment   ? ? [place] Name 05/15/21 1945 05/15/21 1300  ?  ?  ?  ? Does patient have an order for bedrest or is patient medically unstable No - Continue assessment No - Continue assessment     ? What is the highest level of mobility based on the progressive mobility assessment? Level 1 (Bedfast) - Unable to balance while sitting on edge of bed Level 1 (Bedfast) - Unable to balance while sitting on edge of bed     ? Is the above level different from baseline mobility prior to current illness? Yes - Recommend PT order Yes - Recommend PT order     ? ?  ?  ? ?  ?  ? ?Objective: ?Vitals last 24 hrs: ?  Vitals:  ? 05/15/21 1450 05/15/21 2229 05/16/21 0447 05/16/21 0846  ?BP: 114/74 111/75 126/72 124/83  ?Pulse: (!) 134 (!) 109 (!) 114 (!) 122  ?Resp: '20 20 18 16  '$ ?Temp: 99.6 ?F (37.6 ?C) 98.8 ?F (37.1 ?C) 98.4 ?F (36.9 ?C) 98.9 ?F (37.2 ?C)  ?TempSrc: Oral Oral Oral Oral  ?SpO2: 95% 95% 100% 100%  ? ?Weight change:   ? ?Physical Examination: ?General exam: AA, older than stated age, weak appearing. ?HEENT:Oral mucosa moist, Ear/Nose WNL grossly, dentition normal. ?Respiratory system: bilaterally diminished, no use of accessory muscle ?Cardiovascular system: S1 & S2 +, No JVD,. ?Gastrointestinal system: Abdomen soft, minimal tenderness with moderate distention,BS+ ?Nervous System:Alert, awake, moving extremities and grossly nonfocal ?Extremities: Asymmetric leg, conratacture ?Skin: No rashes,no icterus. ?MSK: thin muscle bulk,tone, power  ?Medications reviewed:  ?Scheduled Meds: ? baclofen  20 mg Oral QID  ? diazepam  2 mg Oral BID  ? metoprolol tartrate  50 mg Oral BID  ? ?Continuous Infusions: ? sodium chloride 75 mL/hr at 05/16/21 0736  ? ?Diet Order   ? ?       ?  Diet clear liquid Room service appropriate? Yes; Fluid consistency: Thin  Diet effective now       ?  ? ?  ?  ? ?  ? ?Intake/Output Summary (Last 24 hours) at 05/16/2021 1040 ?Last data filed at 05/15/2021 1843 ?Gross per 24 hour  ?Intake 2667.56 ml  ?Output --  ?Net 2667.56 ml  ? ?Net IO Since Admission: 2,167.56 mL [05/16/21 1040]  ?Wt Readings from Last 3 Encounters:  ?03/19/21 81.6 kg  ?02/16/19 72 kg  ?02/14/18 133.1 kg  ?  ? ?Unresulted Labs (From admission, onward)  ? ?  Start     Ordered  ? 05/16/21 3149  Basic metabolic panel  Daily,   R     ? 05/15/21 1308  ? 05/15/21 0500  Protime-INR  Daily,   R     ? 06-02-2021 1040  ? 05/15/21 0500  CBC  Daily,   R     ? Jun 02, 2021 1040  ? ?  ?  ? ?  ?Data Reviewed: I have personally reviewed following labs and imaging studies ?CBC: ?Recent Labs  ?Lab Jun 02, 2021 ?7026 05/15/21 ?3785 05/16/21 ?8850  ?WBC 9.9 8.2 6.5  ?NEUTROABS 7.8*  --   --   ?HGB 15.0 13.8 12.4*  ?HCT 44.4 41.7 37.1*  ?MCV 91.0 90.8 92.1  ?PLT 224 165 149*  ? ?Basic Metabolic Panel: ?Recent Labs  ?Lab 2021-06-02 ?2774 05/15/21 ?1287 05/16/21 ?8676  ?NA 141 142 140  ?K 3.9 3.8 3.5  ?CL 101 107 109  ?CO2 32 26 25  ?GLUCOSE 109* 90 106*  ?BUN '12 15 10   '$ ?CREATININE 0.90 0.77 0.67  ?CALCIUM 9.2 8.4* 7.7*  ? ?GFR: ?CrCl cannot be calculated (Unknown ideal weight.). ?Liver Function Tests: ?Recent Labs  ?Lab 06/02/21 ?7209 05/15/21 ?4709  ?AST 23 19  ?ALT 21 17  ?ALKPHOS 63 57  ?BILITOT 1.2 1.6*  ?PROT 6.7 6.2*  ?ALBUMIN 3.8 3.5  ? ?Recent Labs  ?Lab 06/02/2021 ?6283  ?LIPASE 25  ? ?No results for input(s): AMMONIA in the last 168 hours. ?Coagulation Profile: ?Recent Labs  ?Lab 2021/06/02 ?6629 05/15/21 ?4765 05/16/21 ?4650  ?INR 2.4* 2.4* 2.6*  ? ?BNP (last 3 results) ?No results for input(s): PROBNP in the last 8760 hours. ?HbA1C: ?No results for input(s): HGBA1C in the last 72 hours. ?CBG: ?No results for input(s): GLUCAP in the last  168 hours. ?Lipid Profile: ?No results for input(s): CHOL, HDL, LDLCALC, TRIG, CHOLHDL, LDLDIRECT in the last 72 hours. ?Thyroid Function Tests: ?No results for input(s): TSH, T4TOTAL, FREET4, T3FREE, THYROIDAB in the last 72 hours. ?Sepsis Labs: ?Recent Labs  ?Lab 05/14/21 ?3143 05/14/21 ?0813 05/15/21 ?1605 05/16/21 ?8887  ?LATICACIDVEN 2.9* 2.8* 2.0* 1.2  ? ? ?Recent Results (from the past 240 hour(s))  ?Culture, blood (Routine X 2) w Reflex to ID Panel     Status: None (Preliminary result)  ? Collection Time: 05/14/21 12:38 PM  ? Specimen: BLOOD  ?Result Value Ref Range Status  ? Specimen Description   Final  ?  BLOOD BLOOD LEFT FOREARM ?Performed at [facility], [place] [address] [place], [place] [address] ?  ? Special Requests   Final  ?  BOTTLES DRAWN AEROBIC AND ANAEROBIC Blood Culture adequate volume ?Performed at [facility], [place] [address], [place], [place] [address] ?  ? Culture   Final  ?  NO GROWTH 2 DAYS ?Performed at [facility], [place] [address], [place], [place] [address] ?  ? Report Status PENDING  Incomplete  ?Culture, blood (Routine X 2) w Reflex to ID Panel     Status: None (Preliminary result)  ? Collection Time: 05/14/21  6:20 PM  ? Specimen: BLOOD  ?Result Value Ref Range  Status  ? Specimen Description   Final  ?  BLOOD SITE NOT SPECIFIED ?Performed at [facility], [place] [address] [place], [place] [address] ?  ? Special Requests   Final  ?  IN PEDIATRIC BOTTLE Blood Culture ad

## 2021-05-17 DIAGNOSIS — K56609 Unspecified intestinal obstruction, unspecified as to partial versus complete obstruction: Secondary | ICD-10-CM | POA: Diagnosis not present

## 2021-05-17 LAB — BASIC METABOLIC PANEL
Anion gap: 4 — ABNORMAL LOW (ref 5–15)
BUN: 5 mg/dL — ABNORMAL LOW (ref 6–20)
CO2: 26 mmol/L (ref 22–32)
Calcium: 7.9 mg/dL — ABNORMAL LOW (ref 8.9–10.3)
Chloride: 108 mmol/L (ref 98–111)
Creatinine, Ser: 0.62 mg/dL (ref 0.61–1.24)
GFR, Estimated: >60 mL/min (ref >60)
Glucose, Bld: 111 mg/dL — ABNORMAL HIGH (ref 70–99)
Potassium: 3.6 mmol/L (ref 3.5–5.1)
Sodium: 138 mmol/L (ref 135–145)

## 2021-05-17 LAB — CBC
HCT: 38.6 % — ABNORMAL LOW (ref 39.0–52.0)
Hemoglobin: 12.5 g/dL — ABNORMAL LOW (ref 13.0–17.0)
MCH: 30.3 pg (ref 26.0–34.0)
MCHC: 32.4 g/dL (ref 30.0–36.0)
MCV: 93.5 fL (ref 80.0–100.0)
Platelets: 153 10*3/uL (ref 150–400)
RBC: 4.13 MIL/uL — ABNORMAL LOW (ref 4.22–5.81)
RDW: 14.5 % (ref 11.5–15.5)
WBC: 6.2 10*3/uL (ref 4.0–10.5)
nRBC: 0 % (ref 0.0–0.2)

## 2021-05-17 LAB — PROTIME-INR
INR: 1.9 — ABNORMAL HIGH (ref 0.8–1.2)
Prothrombin Time: 21.9 seconds — ABNORMAL HIGH (ref 11.4–15.2)

## 2021-05-17 MED ORDER — WARFARIN SODIUM 3 MG PO TABS
3.0000 mg | ORAL_TABLET | Freq: Once | ORAL | Status: AC
  Administered 2021-05-17: 3 mg via ORAL
  Filled 2021-05-17: qty 1

## 2021-05-17 MED ORDER — ENOXAPARIN SODIUM 100 MG/ML IJ SOSY
90.0000 mg | PREFILLED_SYRINGE | Freq: Two times a day (BID) | INTRAMUSCULAR | Status: DC
  Administered 2021-05-17 (×2): 90 mg via SUBCUTANEOUS
  Filled 2021-05-17 (×2): qty 1

## 2021-05-17 NOTE — Care Management Important Message (Signed)
Important Message ? ?Patient Details IM Letter given to the Patient. ?Name: [name] ?MRN: [id number] ?Date of Birth: March 17, 1980 ? ? ?Medicare Important Message Given:  Yes ? ? ? ? ?Kerin Salen ?05/17/2021, 11:44 AM ?

## 2021-05-17 NOTE — Progress Notes (Signed)
[facility] ?Progress Note ? ?   ?Subjective: ?Had a BM after enema yesterday.  Tolerating diet yesterday with no nausea or vomiting.  Soft diet for breakfast on its way up right now ? ?Objective: ?Vital signs in last 24 hours: ?Temp:  [98.6 ?F (37 ?C)-99.8 ?F (37.7 ?C)] 98.6 ?F (37 ?C) (04/20 0454) ?Pulse Rate:  [94-117] 94 (04/20 0454) ?Resp:  [18-20] 19 (04/20 0454) ?BP: (109-140)/(70-75) 109/75 (04/20 0454) ?SpO2:  [98 %-100 %] 100 % (04/20 0454) ?Weight:  [88.3 kg] 88.3 kg (04/19 1829) ?Last BM Date : 05/12/21 ? ?Intake/Output from previous day: ?04/19 0701 - 04/20 0700 ?In: 52 [P.O.:460] ?Out: 1700 [Urine:1700] ?Intake/Output this shift: ?No intake/output data recorded. ? ?PE: ?Abd: Soft, less distended.  Great BS, NT ? ? ?Lab Results:  ?Recent Labs  ?  05/16/21 ?9242 05/17/21 ?6834  ?WBC 6.5 6.2  ?HGB 12.4* 12.5*  ?HCT 37.1* 38.6*  ?PLT 149* 153  ? ?BMET ?Recent Labs  ?  05/16/21 ?1962 05/17/21 ?2297  ?NA 140 138  ?K 3.5 3.6  ?CL 109 108  ?CO2 25 26  ?GLUCOSE 106* 111*  ?BUN 10 5*  ?CREATININE 0.67 0.62  ?CALCIUM 7.7* 7.9*  ? ?PT/INR ?Recent Labs  ?  05/16/21 ?9892 05/17/21 ?1194  ?LABPROT 27.2* 21.9*  ?INR 2.6* 1.9*  ? ?CMP  ?   ?Component Value Date/Time  ? NA 138 05/17/2021 0535  ? K 3.6 05/17/2021 0535  ? CL 108 05/17/2021 0535  ? CO2 26 05/17/2021 0535  ? GLUCOSE 111 (H) 05/17/2021 0535  ? BUN 5 (L) 05/17/2021 0535  ? CREATININE 0.62 05/17/2021 0535  ? CREATININE 1.00 07/26/2015 1426  ? CALCIUM 7.9 (L) 05/17/2021 0535  ? PROT 6.2 (L) 05/15/2021 0511  ? ALBUMIN 3.5 05/15/2021 0511  ? AST 19 05/15/2021 0511  ? ALT 17 05/15/2021 0511  ? ALKPHOS 57 05/15/2021 0511  ? BILITOT 1.6 (H) 05/15/2021 0511  ? GFRNONAA >60 05/17/2021 0535  ? GFRNONAA >89 07/26/2015 1426  ? GFRAA >60 05/10/2019 1904  ? GFRAA >89 07/26/2015 1426  ? ?Lipase  ?   ?Component Value Date/Time  ? LIPASE 25 05/14/2021 1740  ? ? ? ? ? ?Studies/Results: ?DG Abd Portable 1V ? ?Result Date: 05/15/2021 ?CLINICAL DATA:  Small bowel  obstruction. EXAM: PORTABLE ABDOMEN - 1 VIEW COMPARISON:  May 14, 2021. FINDINGS: Stable small bowel dilatation is noted concerning for ileus or small-bowel obstruction. Residual contrast is seen in the colon. Phleboliths are noted in the pelvis. IMPRESSION: Stable small bowel dilatation is noted concerning for ileus or small-bowel obstruction. Electronically Signed   By: Marijo Conception M.D.   On: 05/15/2021 11:36   ? ?Anti-infectives: ?Anti-infectives (From admission, onward)  ? ? Start     Dose/Rate Route Frequency Ordered Stop  ? 05/16/21 1800  nitrofurantoin (MACRODANTIN) capsule 50 mg       ?Note to Pharmacy: continuous    ? 50 mg Oral Daily 05/16/21 1317    ? ?  ? ? ? ?Assessment/Plan ? SBO, recurrent ?-resolving ?-soft diet this am, if tolerates can DC home today from our standpoint ?-daily to BID miralax at home.  Will put this in DC [facility] so where he lives can do this as the senekot he is getting I think is not enough for him. ?-coumadin restarted.  INR 1.9 this am ?-discussed with primary service ?  ?FEN - soft ?VTE - SCD's, coumadin ?ID - none recommended from a surgical perspective  ? ?  ?  Below per TRH ?Cerebral Palsy ?Chronic constipation - stool burden on CT. 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 LOS: 3 days  ? ?I reviewed nursing notes, hospitalist notes, last 24 h vitals and pain scores, last 48 h intake and output, last 24 h labs and trends, and last 24 h imaging results. ? ? ? ?Obie Dredge, PA-C ?[facility] ?Please see Amion for pager number during day hours 7:00am-4:30pm ? ? ? ? ? ? ?

## 2021-05-17 NOTE — Progress Notes (Signed)
ANTICOAGULATION CONSULT NOTE - Follow Up Consult ? ?Pharmacy Consult for warfarin, enoxaparin ?Indication: Hx of recurrent DVT and afib ? ?Allergies  ?Allergen Reactions  ? Betadine [Povidone Iodine] Other (See Comments)  ?  Unknown reaction per caregiver  ? Ciprofloxacin Other (See Comments)  ?  Unknown reaction per caregiver  ? Fentanyl Other (See Comments)  ?  Previous reaction resulting in intubation, unclear details per family  ? Morphine Other (See Comments)  ?  Unknown reaction per caregiver  ? ? ?Patient Measurements: ?Heparin Dosing Weight: 78.3 kg (Pt TBW > 125% IBW) ? ?Vital Signs: ?Temp: 98.6 ?F (37 ?C) (04/20 0454) ?BP: 109/75 (04/20 0454) ?Pulse Rate: 94 (04/20 0454) ? ?Labs: ?Recent Labs  ?  05/15/21 ?0370 05/16/21 ?4888 05/17/21 ?9169  ?HGB 13.8 12.4* 12.5*  ?HCT 41.7 37.1* 38.6*  ?PLT 165 149* 153  ?LABPROT 25.9* 27.2* 21.9*  ?INR 2.4* 2.6* 1.9*  ?CREATININE 0.77 0.67 0.62  ? ? ? ?Estimated Creatinine Clearance: 124.1 mL/min (by C-G formula based on SCr of 0.62 mg/dL). ? ? ?Medications:  ?Scheduled:  ? baclofen  20 mg Oral QID  ? cyclobenzaprine  10-20 mg Oral BID WC  ? cyclobenzaprine  20 mg Oral Q24H  ? diazepam  2 mg Oral BID  ? hydrochlorothiazide  12.5 mg Oral Daily  ? loratadine  10 mg Oral Daily  ? metoprolol tartrate  50 mg Oral BID  ? nitrofurantoin  50 mg Oral Daily  ? polyethylene glycol  17 g Oral Daily  ? tiZANidine  2 mg Oral BID  ? tiZANidine  4 mg Oral Q1200  ? Warfarin - Pharmacist Dosing Inpatient   Does not apply [id number]  ? ?Infusions:  ? sodium chloride 75 mL/hr at 05/17/21 0157  ? ?PRN: ketorolac, LORazepam, metoprolol tartrate, prochlorperazine ? 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 CCS recommends supportive care at this time. Off note, warfarin was held at admission d/t NPO status, on 4/20 he was  able to advance to soft die with warfarin resume back on this day. INR was subtherapeutic on 4/20 and pharmacy was consulted to bridge with enoxaparin. ? ?Today, 4/20 ?- INR 1.9 subtherapeutic with doses held on 4/17 ad 4/18 d/t NPO status ?- PO intake 65-100% on 4/19 ?- Hb and plt are stable ?- No signs of bleeding per RN ? ?Goal of Therapy:  ?- INR 2-3 ?- Anti-Xa level 0.6-1 ?- Monitor platelets by anticoagulation protocol: Yes ?  ?Plan:  ?- Start enoxaparin '90mg'$  q12h until INR > 2 ?- Warfarin '3mg'$  x1 this evening   ?- Monitor daily INR and CBC ?- Monitor s/sx of bleeding ?- Noted possible DDI with prn ketorolac, no doses given so far, continue to monitor usage ? ?Thea Alken, PharmD Candidate ?05/17/2021,7:15 AM ? ? ?

## 2021-05-17 NOTE — Progress Notes (Signed)
?PROGRESS NOTE ?[name]  ZGY:[id number] DOB: 1980/05/10 DOA: 05/14/2021 ?PCP: [name], MD  ? ?Brief Narrative/Hospital Course: ?41 y.o. male with medical history significant of cerebral palsy, chronic constipation, HTN, afib on coumadin. 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 patient was very uncomfortable with NG tube and wanted to be removed-NG clamp trial done and subsequently NG tube discontinued by surgery 4/18 and placed on clear liquid diet.   ?   ?Subjective: ?She examined this morning.  Alert awake not in distress.  Had a bowel movement yesterday after an enema.  Denies nausea vomiting, advancing to soft diet today ? ?Assessment and Plan: ?Principal Problem: ?  SBO (small bowel obstruction) ([place]) ?Active Problems: ?  Infantile cerebral palsy ([place]) ?  Atrial fibrillation ([place]) ?  Chronic constipation ?  Hypertension ?  Spasticity ?  Lactic acidosis ? ?SBO, recurrent:No history of ex lap, LOA 2019 by Dr. [name]. S/p NG tube decompression- and off ngt 4/18, tolerated clear liquid diet, advancing soft diet this morning.  Had BM after enema.  Appreciate surgery input.  Continue to follow-up to ensure diet tolerance ? ?Infantile cerebral palsy ?Spasticity: ?continue s home baclofen/tizanidine/Flexeril. ? ?Lactic acidosis likely in the setting of dehydration and sbo-resolved ? ?Sinus tachycardia, in the setting of #1/dehydration.  Heart rate is stable at times in the 1 teens, continue metoprolol stop IV fluids ? ?PAFib - in ST now.  Continue metoprolol, coumadin ?DVT hx on coumadin-INR subtherapeutic, consulted pharmacy to dose Lovenox.   Continue Coumadin per pharmacy ?Recent Labs  ?Lab 05/14/21 ?5916 05/15/21 ?3846 05/16/21 ?6599 05/17/21 ?3570  ?INR 2.4* 2.4* 2.6* 1.9*  ? ?Chronic constipation- cont laxatives as per surgery ?Hypertension: BP stable  cont metoprolol ?Recurrent UTI history, catheter in place.  Continue Foley. ? ?DVT prophylaxis: coumadin inr > 1.5 ?Code Status:   Code Status: Full Code ?Family Communication: plan of care discussed with patient at bedside. ?Patient status is: inpatient Level of care: Telemetry  ?Remains inpatient because: Ongoing management of SBO, sinus tachycardia ?Patient currently not stable ? ?Dispo: The patient is from: home ?           Anticipated disposition: home in next 24 hours ? ?Mobility Assessment (last 72 hours)   ? ? Mobility Assessment   ? ? [place] Name 05/15/21 1945 05/15/21 1300  ?  ?  ?  ? Does patient have an order for bedrest or is patient medically unstable No - Continue assessment No - Continue assessment     ? What is the highest level of mobility based on the progressive mobility assessment? Level 1 (Bedfast) - Unable to balance while sitting on edge of bed Level 1 (Bedfast) - Unable to balance while sitting on edge of bed     ? Is the above level different from baseline mobility prior to current illness? Yes - Recommend PT order Yes - Recommend PT order     ? ?  ?  ? ?  ?  ? ?Objective: ?Vitals last 24 hrs: ?Vitals:  ? 05/16/21 1651 05/16/21 1829 05/16/21 2044 05/17/21 0454  ?BP: 124/70  120/74 109/75  ?Pulse: (!) 102  (!) 101 94  ?Resp: 20  20  19  ?Temp: 99 ?F (37.2 ?C)  99.5 ?F (37.5 ?C) 98.6 ?F (37 ?C)  ?TempSrc: Oral     ?SpO2: 100%  99% 100%  ?Weight:  88.3 kg    ? ?Weight change:  ? ?Physical Examination: ?General exam: Alert awake with baseline mentatsion ?HEENT:Oral mucosa moist, Ear/Nose WNL grossly, dentition normal. ?Respiratory system: bilaterally diminished,no use of accessory muscle ?Cardiovascular system: S1 & S2 +, No JVD,. ?Gastrointestinal system: Abdomen soft, mild to  moderate distended nontender, BS+ ?Nervous System:Alert, awake, moving extremities and grossly nonfocal ?Extremities: edema with asymmetric lower extremities, unable to move lower extremities.   ?Skin: No rashes,no icterus. ?MSK: thin muscle bulk,tone, power ?  ?Medications reviewed:  ?Scheduled Meds: ? baclofen  20 mg Oral QID  ? cyclobenzaprine  10-20 mg Oral BID WC  ? cyclobenzaprine  20 mg Oral Q24H  ? diazepam  2 mg Oral BID  ? enoxaparin (LOVENOX) injection  90 mg Subcutaneous Q12H  ? hydrochlorothiazide  12.5 mg Oral Daily  ? loratadine  10 mg Oral Daily  ? metoprolol tartrate  50 mg Oral BID  ? nitrofurantoin  50 mg Oral Daily  ? polyethylene glycol  17 g Oral Daily  ? tiZANidine  2 mg Oral BID  ? tiZANidine  4 mg Oral Q1200  ? Warfarin - Pharmacist Dosing Inpatient   Does not apply [id number]  ? ?Continuous Infusions: ? sodium chloride 75 mL/hr at 05/17/21 0157  ? ?Diet Order   ? ?       ?  DIET SOFT Room service appropriate? Yes; Fluid consistency: Thin  Diet effective 0500       ?  ? ?  ?  ? ?  ? ?Intake/Output Summary (Last 24 hours) at 05/17/2021 1017 ?Last data filed at 05/17/2021 0944 ?Gross per 24 hour  ?Intake 520 ml  ?Output 1700 ml  ?Net -1180 ml  ? ? ?Net IO Since Admission: 987.56 mL [05/17/21 1017]  ?Wt Readings from Last 3 Encounters:  ?05/16/21 88.3 kg  ?03/19/21 81.6 kg  ?02/16/19 72 kg  ?  ? ?Unresulted Labs (From admission, onward)  ? ?  Start     Ordered  ? 05/16/21 6734  Basic metabolic panel  Daily,   R     ? 05/15/21 1308  ? 05/15/21 0500  Protime-INR  Daily,   R     ? May 18, 2021 1040  ? 05/15/21 0500  CBC  Daily,   R     ? 2021-05-18 1040  ? ?  ?  ? ?  ?Data Reviewed: I have personally reviewed following labs and imaging studies ?CBC: ?Recent Labs  ?Lab 2021-05-18 ?1937 05/15/21 ?9024 05/16/21 ?0973 05/17/21 ?5329  ?WBC 9.9 8.2 6.5 6.2  ?NEUTROABS 7.8*  --   --   --   ?HGB 15.0 13.8 12.4* 12.5*  ?HCT 44.4 41.7 37.1* 38.6*  ?MCV 91.0 90.8 92.1 93.5  ?PLT 224 165 149* 153  ? ? ?Basic Metabolic  Panel: ?Recent Labs  ?Lab May 18, 2021 ?9242 05/15/21 ?6834 05/16/21 ?1962 05/17/21 ?2297  ?NA 141 142 140 138  ?K 3.9 3.8 3.5 3.6  ?CL 101 107 109 108  ?CO2 32 '26 25 26  '$ ?GLUCOSE 109* 90 106* 111*  ?BUN '12 15 10 '$ 5*  ?CREATININE 0.90 0.77 0.67 0.62  ?CALCIUM 9.2 8.4* 7.7* 7.9*  ? ? ?GFR: ?Estimated Creatinine Clearance: 124.1 mL/min (by C-G formula based on SCr of 0.62 mg/dL). ?Liver Function Tests: ?Recent Labs  ?Lab 2021/05/18 ?9892 05/15/21 ?1194  ?  AST 23 19  ?ALT 21 17  ?ALKPHOS 63 57  ?BILITOT 1.2 1.6*  ?PROT 6.7 6.2*  ?ALBUMIN 3.8 3.5  ? ? ?Recent Labs  ?Lab 05/14/21 ?3559  ?LIPASE 25  ? ? ?No results for input(s): AMMONIA in the last 168 hours. ?Coagulation Profile: ?Recent Labs  ?Lab 05/14/21 ?7416 05/15/21 ?3845 05/16/21 ?3646 05/17/21 ?8032  ?INR 2.4* 2.4* 2.6* 1.9*  ? ? ?BNP (last 3 results) ?No results for input(s): PROBNP in the last 8760 hours. ?HbA1C: ?No results for input(s): HGBA1C in the last 72 hours. ?CBG: ?No results for input(s): GLUCAP in the last 168 hours. ?Lipid Profile: ?No results for input(s): CHOL, HDL, LDLCALC, TRIG, CHOLHDL, LDLDIRECT in the last 72 hours. ?Thyroid Function Tests: ?No results for input(s): TSH, T4TOTAL, FREET4, T3FREE, THYROIDAB in the last 72 hours. ?Sepsis Labs: ?Recent Labs  ?Lab 05/14/21 ?1224 05/14/21 ?0813 05/15/21 ?1605 05/16/21 ?8250  ?LATICACIDVEN 2.9* 2.8* 2.0* 1.2  ? ? ? ?Recent Results (from the past 240 hour(s))  ?Culture, blood (Routine X 2) w Reflex to ID Panel     Status: None (Preliminary result)  ? Collection Time: 05/14/21 12:38 PM  ? Specimen: BLOOD  ?Result Value Ref Range Status  ? Specimen Description   Final  ?  BLOOD BLOOD LEFT FOREARM ?Performed at [facility], [place] [address], [place], [place] [address] ?  ? Special Requests   Final  ?  BOTTLES DRAWN AEROBIC AND ANAEROBIC Blood Culture adequate volume ?Performed at [facility], [place] [address], [place], [place] [address] ?  ? Culture   Final  ?  NO GROWTH 3  DAYS ?Performed at [facility], [place] [address] [place], [place] [address] ?  ? Report Status PENDING  Incomplete  ?Culture, blood (Routine X 2) w Reflex to ID Panel     Status: None (Preliminary result)

## 2021-05-18 ENCOUNTER — Encounter (HOSPITAL_COMMUNITY): Payer: Self-pay | Admitting: Internal Medicine

## 2021-05-18 DIAGNOSIS — I4891 Unspecified atrial fibrillation: Secondary | ICD-10-CM

## 2021-05-18 LAB — PROTIME-INR
INR: 2.4 — ABNORMAL HIGH (ref 0.8–1.2)
Prothrombin Time: 26.2 seconds — ABNORMAL HIGH (ref 11.4–15.2)

## 2021-05-18 MED ORDER — PEG 3350 17 GM/SCOOP PO POWD: 17.0000 g | Freq: Every day | ORAL | 0 refills | Status: AC

## 2021-05-18 MED ORDER — WARFARIN SODIUM 1 MG PO TABS
1.0000 mg | ORAL_TABLET | Freq: Once | ORAL | Status: AC
  Administered 2021-05-18: 1 mg via ORAL
  Filled 2021-05-18: qty 1

## 2021-05-18 NOTE — Discharge Summary (Signed)
Physician Discharge Summary  ?Oslo Huntsman RKY:[id number] DOB: Jul 01, 1980 DOA: 05/14/2021 ? ?PCP: [name], MD ? ?Admit date: 05/14/2021 ?Discharge date: 05/18/2021 ?Recommendations for Outpatient Follow-up:  ?Follow up with PCP in 1 weeks-call for appointment ?Please obtain BMP/CBC in one week ? ?Discharge Dispo: home w/ assistance ?Discharge Condition: Stable ?Code Status:   Code Status: Full Code ?Diet recommendation:  ?Diet Order   ? ?       ?  DIET SOFT Room service appropriate? Yes; Fluid consistency: Thin  Diet effective 0500       ?  ? ?  ?  ? ?  ?Brief/Interim Summary: ?41 y.o. male with medical history significant of cerebral palsy, chronic constipation, HTN, afib on coumadin. 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 patient was very uncomfortable with NG tube and wanted to be removed-NG clamp trial done and subsequently NG tube discontinued by surgery 4/18 and placed on clear liquid diethad an enema 4/19 with good bowel movement,.Diet being advanced, and tolerating diet. Cleared by surgery for discharge. He was Kept 1 additional day due to INR < 2. Now inr therapeutic and doing well.  ? ?Discharge Diagnoses:  ?Principal Problem: ?  SBO (small bowel obstruction) ([place]) ?Active Problems: ?  Infantile cerebral palsy ([place]) ?  Atrial fibrillation ([place]) ?  Chronic constipation ?  Hypertension ?  Spasticity ?  Lactic acidosis ? ?SBO, recurrent with chronic constipation:No history of ex lap, LOA 2019 by Dr. [name]. S/p NG tube decompression- and off ngt 4/18, tolerated diet and had BM .  Cleared by surgery for discharge.  He will continue on  MiraLAX daily along with as needed additional laxatives- dc instruction provided for caregiver, was informed by ccs team abt the same with care givers for his chronic constipation. ?  ?Infantile cerebral palsy ?Spasticity: ?continue s home baclofen/tizanidine/Flexeril. ?  ?Lactic acidosis likely in the setting of dehydration and sbo-resolved ?  ?Sinus tachycardia, HR improved.continue metoprolol ?PAFib - in ST now.Continue metoprolol, coumadin ?DVT hx on coumadin-INR therapeutic, cont coumadin ?Recent Labs  ?Lab 05/14/21 ?3151 05/15/21 ?7616 05/16/21 ?0737 05/17/21 ?1062 05/18/21 ?0507  ?INR 2.4* 2.4* 2.6* 1.9* 2.4*  ?  ?Pressure Ulcer: ?Pressure Injury 12/23/17 Stage II -  Partial thickness loss of dermis presenting as a shallow open ulcer with a red, pink wound bed without slough. (Active)  ?12/23/17 1955  ?Location: Coccyx  ?Location Orientation: Mid  ?Staging: Stage II -  Partial thickness loss of dermis presenting as a shallow open ulcer with a red, pink wound bed without slough.  ?Wound Description (Comments):   ?Present on Admission: No  ? ? ?Consults: ?CCS ?Subjective: ?Aa, resting well, wants to go home, today, tolerating diet. ? ?Discharge Exam: ?Vitals:  ? 05/17/21 2003 05/18/21 0514  ?BP: 135/90 119/86  ?Pulse: (!) 108 98  ?Resp: 20 20  ?Temp: 99.4 ?F (37.4 ?C) 98.6 ?F (37 ?C)  ?SpO2: 100% 98%  ? ?General: Pt is alert, awake, with baseline cognitive impairment ?Cardiovascular: RRR, S1/S2 +, no rubs, no gallops ?Respiratory: CTA bilaterally, no wheezing, no rhonchi ?Abdominal: Soft, NT, ND, bowel sounds + ?Extremities: no edema, no cyanosis ? ?Discharge Instructions ? ?Discharge Instructions   ? ?  Discharge instructions   Complete by: As directed ?  ? PCP follow-up in 1 week and check CBC BMP ?Cont daily miralax and stool softeners as neede ? ?Please call call MD or return to ER for similar or worsening recurring problem that brought you to hospital or if any fever,nausea/vomiting,abdominal pain,  uncontrolled pain, chest pain,  shortness of breath or any other alarming symptoms. ? ?Please follow-up your doctor as instructed in a week time and call the office for appointment. ? ?Please avoid alcohol, smoking, or any other illicit substance and maintain healthy habits including taking your regular medications as prescribed. ? ?You were cared for by a hospitalist during your hospital stay. If you have any questions about your discharge medications or the care you received while you were in the hospital after you are discharged, you can call the unit and ask to speak with the hospitalist on call if the hospitalist that took care of you is not available. ? ?Once you are discharged, your primary care physician will handle any further medical issues. Please note that NO REFILLS for any discharge medications will be authorized once you are discharged, as it is imperative that you return to your primary care physician (or establish a relationship with a primary care physician if you do not have one) for your aftercare needs so that they can reassess your need for medications and monitor your lab values  ? Increase activity slowly   Complete by: As directed ?  ? ?  ? ?Allergies as of 05/18/2021   ? ?   Reactions  ? Betadine [povidone Iodine] Other (See Comments)  ? Unknown reaction per caregiver  ? Ciprofloxacin Other (See Comments)  ? Unknown reaction per caregiver  ? Fentanyl Other (See Comments)  ? Previous reaction resulting in intubation, unclear details per family  ? Morphine Other (See Comments)  ? Unknown reaction per caregiver  ? ?  ? ?  ?Medication List  ?  ? ?TAKE these medications   ? ?acetaminophen 325 MG tablet ?Commonly known as: TYLENOL ?Take 650 mg by mouth every 6 (six) hours as needed for mild pain or moderate pain. For pain ?  ?baclofen 20 MG tablet ?Commonly known as: LIORESAL ?Take 1 tablet (20 mg total) by mouth 4 (four) times daily. ?  ?calcium carbonate 500 MG chewable tablet ?Commonly known  as: TUMS - dosed in mg elemental calcium ?Chew 1 tablet by mouth 3 (three) times daily. ?  ?cetirizine 10 MG tablet ?Commonly known as: ZYRTEC ?Take 10 mg by mouth daily. ?  ?cholecalciferol 1000 units tablet ?Commonly known as: VITAMIN D ?Take 1,000 Units by mouth 2 (two) times daily. ?  ?cyclobenzaprine 10 MG tablet ?Commonly known as: FLEXERIL ?1 at 8am and 12pm and 2 tabs at 8pm. ?What changed:  ?how much to take ?how to take this ?when to take this ?  ?dextromethorphan 30 MG/5ML liquid ?Commonly known as: DELSYM ?Take 30 mg by mouth as needed for cough. ?  ?diazepam 2 MG tablet ?Commonly known as: VALIUM ?Take 1 tablet (2 mg total) by mouth 2 (two) times daily. Must last 30 days. ?  ?diphenhydrAMINE 25 MG tablet ?Commonly known as: BENADRYL ?Take 25 mg by mouth every 6 (six) hours as needed for allergies. ?  ?furosemide 20 MG tablet ?Commonly known as: LASIX ?Take 20 mg by mouth daily as needed for fluid (leg edema). ?  ?Gel-Kam 0.4 % Gel ?Generic drug: Stannous Fluoride ?Place 1 application onto teeth daily. ?  ?  glycerin adult 2 g suppository ?Place 1 suppository rectally as needed for constipation. ?  ?hydrochlorothiazide 25 MG tablet ?Commonly known as: HYDRODIURIL ?Take 12.5 mg by mouth daily. ?  ?metoprolol succinate 50 MG 24 hr tablet ?Commonly known as: TOPROL-XL ?Take 1 tablet (50 mg total) by mouth daily. ?  ?multivitamin with minerals Tabs tablet ?Take 1 tablet by mouth daily. ?  ?nitrofurantoin 50 MG capsule ?Commonly known as: MACRODANTIN ?Take 50 mg by mouth daily. continuous ?  ?ondansetron 4 MG disintegrating tablet ?Commonly known as: Zofran ODT ?Take 1 tablet (4 mg total) by mouth every 8 (eight) hours as needed for nausea or vomiting. ?  ?PEG 3350 17 GM/SCOOP Powd ?Take 17 g by mouth daily at 6 (six) AM. ?What changed:  ?when to take this ?reasons to take this ?  ?psyllium 95 % Pack ?Commonly known as: HYDROCIL/METAMUCIL ?Give him one dose daily as directed on the package.  This is to help  with his constipation.  You can buy this over the counter at any drug store. ?What changed:  ?how much to take ?how to take this ?when to take this ?reasons to take this ?additional instructions ?  ?senna-docusate 8.6-5

## 2021-05-18 NOTE — Progress Notes (Signed)
ANTICOAGULATION CONSULT NOTE - Follow Up Consult ? ?Pharmacy Consult for warfarin, enoxaparin ?Indication: Hx of recurrent DVT and afib ? ?Allergies  ?Allergen Reactions  ? Betadine [Povidone Iodine] Other (See Comments)  ?  Unknown reaction per caregiver  ? Ciprofloxacin Other (See Comments)  ?  Unknown reaction per caregiver  ? Fentanyl Other (See Comments)  ?  Previous reaction resulting in intubation, unclear details per family  ? Morphine Other (See Comments)  ?  Unknown reaction per caregiver  ? ? ?Patient Measurements: ?Heparin Dosing Weight: 78.3 kg (Pt TBW > 125% IBW) ? ?Vital Signs: ?Temp: 98.6 ?F (37 ?C) (04/21 0514) ?Temp Source: Axillary (04/21 0514) ?BP: 119/86 (04/21 0514) ?Pulse Rate: 98 (04/21 0514) ? ?Labs: ?Recent Labs  ?  05/16/21 ?3716 05/17/21 ?9678 05/18/21 ?0507  ?HGB 12.4* 12.5*  --   ?HCT 37.1* 38.6*  --   ?PLT 149* 153  --   ?LABPROT 27.2* 21.9* 26.2*  ?INR 2.6* 1.9* 2.4*  ?CREATININE 0.67 0.62  --   ? ? ? ?Estimated Creatinine Clearance: 123.2 mL/min (by C-G formula based on SCr of 0.62 mg/dL). ? ? ?Medications:  ?Scheduled:  ? baclofen  20 mg Oral QID  ? cyclobenzaprine  10-20 mg Oral BID WC  ? cyclobenzaprine  20 mg Oral Q24H  ? diazepam  2 mg Oral BID  ? enoxaparin (LOVENOX) injection  90 mg Subcutaneous Q12H  ? hydrochlorothiazide  12.5 mg Oral Daily  ? loratadine  10 mg Oral Daily  ? metoprolol tartrate  50 mg Oral BID  ? nitrofurantoin  50 mg Oral Daily  ? polyethylene glycol  17 g Oral Daily  ? tiZANidine  2 mg Oral BID  ? tiZANidine  4 mg Oral Q1200  ? Warfarin - Pharmacist Dosing Inpatient   Does not apply [id number]  ? ?Infusions:  ? ? ?PRN: ketorolac, LORazepam, metoprolol tartrate, prochlorperazine ? 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 CCS recommends supportive care at this time. Off note, warfarin  was held at admission d/t NPO status, on 4/20 he was able to advance to soft die with warfarin resume back on this day. INR was subtherapeutic on 4/20 thus bridging with enoxaparin. ? ?Today, 4/21 ?- INR 2.4 therapeutic ?- PO intake 75% on 4/20 ?- No signs of bleeding per RN ? ?Goal of Therapy:  ?- INR 2-3 ?- Monitor platelets by anticoagulation protocol: Yes ?  ?Plan:  ?- D/c enoxaparin d/t to INR being therapeutic ?- Warfarin '1mg'$  x1 this evening   ?- Monitor daily INR and CBC ?- Monitor s/sx of bleeding ?- Noted possible DDI with prn ketorolac, no doses given so far, continue to monitor usage ? ?Thea Alken, PharmD Candidate ?05/18/2021,7:35 AM ? ? ?

## 2021-05-19 LAB — CULTURE, BLOOD (ROUTINE X 2)
Culture: NO GROWTH
Culture: NO GROWTH
Special Requests: ADEQUATE
Special Requests: ADEQUATE

## 2021-08-28 ENCOUNTER — Other Ambulatory Visit: Payer: Self-pay | Admitting: Internal Medicine

## 2021-08-29 LAB — COMPLETE METABOLIC PANEL WITH GFR
AG Ratio: 1.5 (calc) (ref 1.0–2.5)
ALT: 16 U/L (ref 9–46)
AST: 21 U/L (ref 10–40)
Albumin: 3.8 g/dL (ref 3.6–5.1)
Alkaline phosphatase (APISO): 57 U/L (ref 36–130)
BUN: 11 mg/dL (ref 7–25)
CO2: 23 mmol/L (ref 20–32)
Calcium: 8.8 mg/dL (ref 8.6–10.3)
Chloride: 104 mmol/L (ref 98–110)
Creat: 0.89 mg/dL (ref 0.60–1.29)
Globulin: 2.5 g/dL (calc) (ref 1.9–3.7)
Glucose, Bld: 107 mg/dL — ABNORMAL HIGH (ref 65–99)
Potassium: 4.2 mmol/L (ref 3.5–5.3)
Sodium: 141 mmol/L (ref 135–146)
Total Bilirubin: 0.5 mg/dL (ref 0.2–1.2)
Total Protein: 6.3 g/dL (ref 6.1–8.1)
eGFR: 110 mL/min/{1.73_m2} (ref >=60)

## 2021-08-29 LAB — LIPID PANEL
Cholesterol: 139 mg/dL (ref <200)
HDL: 31 mg/dL — ABNORMAL LOW (ref >=40)
Non-HDL Cholesterol (Calc): 108 mg/dL (calc) (ref <130)
Total CHOL/HDL Ratio: 4.5 (calc) (ref <5.0)
Triglycerides: 439 mg/dL — ABNORMAL HIGH (ref <150)

## 2021-08-29 LAB — CBC
HCT: 43.7 % (ref 38.5–50.0)
Hemoglobin: 15.1 g/dL (ref 13.2–17.1)
MCH: 30.8 pg (ref 27.0–33.0)
MCHC: 34.6 g/dL (ref 32.0–36.0)
MCV: 89.2 fL (ref 80.0–100.0)
MPV: 9.4 fL (ref 7.5–12.5)
Platelets: 205 10*3/uL (ref 140–400)
RBC: 4.9 10*6/uL (ref 4.20–5.80)
RDW: 13 % (ref 11.0–15.0)
WBC: 5.4 10*3/uL (ref 3.8–10.8)

## 2021-08-29 LAB — TSH: TSH: 0.96 mIU/L (ref 0.40–4.50)

## 2021-08-29 LAB — VITAMIN D 25 HYDROXY (VIT D DEFICIENCY, FRACTURES): Vit D, 25-Hydroxy: 48 ng/mL (ref 30–100)

## 2022-01-16 ENCOUNTER — Other Ambulatory Visit: Payer: Self-pay | Admitting: Urology

## 2022-01-24 NOTE — Patient Instructions (Signed)
DUE TO COVID-19 ONLY TWO VISITORS  (aged 42 and older)  ARE ALLOWED TO COME WITH YOU AND STAY IN THE WAITING ROOM ONLY DURING PRE OP AND PROCEDURE.   **NO VISITORS ARE ALLOWED IN THE SHORT STAY AREA OR RECOVERY ROOM!!**  IF YOU WILL BE ADMITTED INTO THE HOSPITAL YOU ARE ALLOWED ONLY FOUR SUPPORT PEOPLE DURING VISITATION HOURS ONLY (7 AM -8PM)   The support person(s) must pass our screening, gel in and out, and wear a mask at all times, including in the patient's room. Patients must also wear a mask when staff or their support person are in the room. Visitors GUEST BADGE MUST BE WORN VISIBLY  One adult visitor may remain with you overnight and MUST be in the room by 8 P.M.     Your procedure is scheduled on: 02/04/22   Report to [facility] Main Entrance    Report to admitting at : 10:15 AM   Call this number if you have problems the morning of surgery [phone]   Do not eat food :After Midnight.   After Midnight you may have the following liquids until : 9:30 AM DAY OF SURGERY  Water Black Coffee (sugar ok, NO MILK/CREAM OR CREAMERS)  Tea (sugar ok, NO MILK/CREAM OR CREAMERS) regular and decaf                             Plain Jell-O (NO RED)                                           Fruit ices (not with fruit pulp, NO RED)                                     Popsicles (NO RED)                                                                  Juice: apple, WHITE grape, WHITE cranberry Sports drinks like Gatorade (NO RED)                  Oral Hygiene is also important to reduce your risk of infection.                                    Remember - BRUSH YOUR TEETH THE MORNING OF SURGERY WITH YOUR REGULAR TOOTHPASTE  DENTURES WILL BE REMOVED PRIOR TO SURGERY PLEASE DO NOT APPLY "Poly grip" OR ADHESIVES!!!   Do NOT smoke after Midnight   Take these medicines the morning of surgery with A SIP OF WATER: cetirizine,metoprolol.Tylenol,diazepam as needed.                               You may not have any metal on your body including hair pins, jewelry, and body piercing             Do not wear lotions, powders, perfumes/cologne,  or deodorant              Men may shave face and neck.   Do not bring valuables to the hospital. [place].   Contacts, glasses, or bridgework may not be worn into surgery.   Bring small overnight bag day of surgery.   DO NOT [place]. PHARMACY WILL DISPENSE MEDICATIONS LISTED ON YOUR MEDICATION LIST TO YOU DURING YOUR ADMISSION [place]!    Patients discharged on the day of surgery will not be allowed to drive home.  Someone NEEDS to stay with you for the first 24 hours after anesthesia.   Special Instructions: Bring a copy of your healthcare power of attorney and living will documents         the day of surgery if you haven't scanned them before.              Please read over the following fact sheets you were given: IF YOU HAVE QUESTIONS ABOUT YOUR PRE-OP INSTRUCTIONS PLEASE CALL [phone]    [facility] - Preparing for Surgery Before surgery, you can play an important role.  Because skin is not sterile, your skin needs to be as free of germs as possible.  You can reduce the number of germs on your skin by washing with CHG (chlorahexidine gluconate) soap before surgery.  CHG is an antiseptic cleaner which kills germs and bonds with the skin to continue killing germs even after washing. Please DO NOT use if you have an allergy to CHG or antibacterial soaps.  If your skin becomes reddened/irritated stop using the CHG and inform your nurse when you arrive at Short Stay. Do not shave (including legs and underarms) for at least 48 hours prior to the first CHG shower.  You may shave your face/neck. Please follow these instructions carefully:  1.  Shower with CHG Soap the night before surgery and the  morning of Surgery.  2.  If you choose to  wash your hair, wash your hair first as usual with your  normal  shampoo.  3.  After you shampoo, rinse your hair and body thoroughly to remove the  shampoo.                           4.  Use CHG as you would any other liquid soap.  You can apply chg directly  to the skin and wash                       Gently with a scrungie or clean washcloth.  5.  Apply the CHG Soap to your body ONLY FROM THE NECK DOWN.   Do not use on face/ open                           Wound or open sores. Avoid contact with eyes, ears mouth and genitals (private parts).                       Wash face,  Genitals (private parts) with your normal soap.             6.  Wash thoroughly, paying special attention to the area where your surgery  will be performed.  7.  Thoroughly rinse your body with warm water from the neck down.  8.  DO NOT shower/wash with your normal soap after using and rinsing off  the CHG Soap.                9.  Pat yourself dry with a clean towel.            10.  Wear clean pajamas.            11.  Place clean sheets on your bed the night of your first shower and do not  sleep with pets. Day of Surgery : Do not apply any lotions/deodorants the morning of surgery.  Please wear clean clothes to the hospital/surgery center.  FAILURE TO FOLLOW THESE INSTRUCTIONS MAY RESULT IN THE CANCELLATION OF YOUR SURGERY PATIENT SIGNATURE_________________________________  NURSE SIGNATURE__________________________________  ________________________________________________________________________

## 2022-01-25 ENCOUNTER — Encounter (HOSPITAL_COMMUNITY)
Admission: RE | Admit: 2022-01-25 | Discharge: 2022-01-25 | Disposition: A | Discharge status: Home / Self Care | Payer: Medicare Other | Source: Ambulatory Visit | Attending: Urology | Admitting: Urology

## 2022-01-25 ENCOUNTER — Encounter (HOSPITAL_COMMUNITY): Payer: Self-pay

## 2022-01-25 ENCOUNTER — Other Ambulatory Visit: Payer: Self-pay

## 2022-01-25 VITALS — BP 125/85 | HR 101 | Temp 98.0°F | Ht 65.0 in | Wt 170.0 lb

## 2022-01-25 DIAGNOSIS — Z01812 Encounter for preprocedural laboratory examination: Secondary | ICD-10-CM | POA: Insufficient documentation

## 2022-01-25 DIAGNOSIS — I10 Essential (primary) hypertension: Secondary | ICD-10-CM | POA: Diagnosis not present

## 2022-01-25 DIAGNOSIS — Z7901 Long term (current) use of anticoagulants: Secondary | ICD-10-CM | POA: Diagnosis not present

## 2022-01-25 HISTORY — DX: Pneumonia, unspecified organism: J18.9

## 2022-01-25 HISTORY — DX: Cardiac arrhythmia, unspecified: I49.9

## 2022-01-25 NOTE — Progress Notes (Addendum)
For Short Stay: [place] appointment date:  Bowel Prep reminder:   For Anesthesia: PCP - Dr. [name] Cardiologist -   Chest x-ray -  EKG - 05/14/21 Stress Test -  ECHO -  Cardiac Cath -  Pacemaker/ICD device last checked: Pacemaker orders received: Device Rep notified:  Spinal Cord Stimulator:  Sleep Study -  CPAP -   Fasting Blood Sugar -  Checks Blood Sugar _____ times a day Date and result of last Hgb A1c-  Last dose of GLP1 agonist-  GLP1 instructions:   Last dose of SGLT-2 inhibitors-  SGLT-2 instructions:   Blood Thinner Instructions:Pam from Dr. Purvis Sheffield office was contacted about coumadin instructions,she said she has been trying to reach Dr. Jeanie Cooks for instructions. Pt's caretaker is aware that Pam will contact him with the coumadin instructions. Aspirin Instructions: Last Dose:  Activity level: Can go up a flight of stairs and activities of daily living without stopping and without chest pain and/or shortness of breath   Able to exercise without chest pain and/or shortness of breath   Unable to go up a flight of stairs without chest pain and/or shortness of breath     Anesthesia review: Hx: DVT,HTN,Tachycardia,Afib,cerebral palsy.  Patient denies shortness of breath, fever, cough and chest pain at PAT appointment   Patient verbalized understanding of instructions that were given to them at the PAT appointment. Patient was also instructed that they will need to review over the PAT instructions again at home before surgery.

## 2022-02-04 ENCOUNTER — Ambulatory Visit (HOSPITAL_COMMUNITY): Payer: Medicare Other

## 2022-02-04 ENCOUNTER — Other Ambulatory Visit: Payer: Self-pay

## 2022-02-04 ENCOUNTER — Encounter (HOSPITAL_COMMUNITY): Payer: Self-pay | Admitting: Urology

## 2022-02-04 ENCOUNTER — Ambulatory Visit (HOSPITAL_COMMUNITY)
Admission: RE | Admit: 2022-02-04 | Discharge: 2022-02-04 | Disposition: A | Discharge status: Home / Self Care | Payer: Medicare Other | Attending: Urology | Admitting: Urology

## 2022-02-04 ENCOUNTER — Ambulatory Visit (HOSPITAL_BASED_OUTPATIENT_CLINIC_OR_DEPARTMENT_OTHER): Payer: Medicare Other | Admitting: Anesthesiology

## 2022-02-04 ENCOUNTER — Encounter (HOSPITAL_COMMUNITY)
Admission: RE | Disposition: A | Discharge status: Home / Self Care | Payer: Self-pay | Source: Home / Self Care | Attending: Urology

## 2022-02-04 ENCOUNTER — Ambulatory Visit (HOSPITAL_COMMUNITY): Payer: Medicare Other | Admitting: Physician Assistant

## 2022-02-04 DIAGNOSIS — I10 Essential (primary) hypertension: Secondary | ICD-10-CM

## 2022-02-04 DIAGNOSIS — Z79899 Other long term (current) drug therapy: Secondary | ICD-10-CM | POA: Insufficient documentation

## 2022-02-04 DIAGNOSIS — R9389 Abnormal findings on diagnostic imaging of other specified body structures: Secondary | ICD-10-CM | POA: Insufficient documentation

## 2022-02-04 DIAGNOSIS — Z7901 Long term (current) use of anticoagulants: Secondary | ICD-10-CM | POA: Diagnosis not present

## 2022-02-04 DIAGNOSIS — I82409 Acute embolism and thrombosis of unspecified deep veins of unspecified lower extremity: Secondary | ICD-10-CM | POA: Diagnosis not present

## 2022-02-04 DIAGNOSIS — G809 Cerebral palsy, unspecified: Secondary | ICD-10-CM | POA: Diagnosis not present

## 2022-02-04 DIAGNOSIS — D494 Neoplasm of unspecified behavior of bladder: Secondary | ICD-10-CM | POA: Diagnosis not present

## 2022-02-04 DIAGNOSIS — Z86718 Personal history of other venous thrombosis and embolism: Secondary | ICD-10-CM | POA: Diagnosis not present

## 2022-02-04 DIAGNOSIS — N3289 Other specified disorders of bladder: Secondary | ICD-10-CM | POA: Insufficient documentation

## 2022-02-04 HISTORY — PX: TRANSURETHRAL RESECTION OF BLADDER TUMOR: SHX2575

## 2022-02-04 LAB — BASIC METABOLIC PANEL
Anion gap: 11 (ref 5–15)
BUN: 9 mg/dL (ref 6–20)
CO2: 28 mmol/L (ref 22–32)
Calcium: 9 mg/dL (ref 8.9–10.3)
Chloride: 103 mmol/L (ref 98–111)
Creatinine, Ser: 0.85 mg/dL (ref 0.61–1.24)
GFR, Estimated: >60 mL/min (ref >60)
Glucose, Bld: 108 mg/dL — ABNORMAL HIGH (ref 70–99)
Potassium: 4 mmol/L (ref 3.5–5.1)
Sodium: 142 mmol/L (ref 135–145)

## 2022-02-04 LAB — CBC
HCT: 45.9 % (ref 39.0–52.0)
Hemoglobin: 14.7 g/dL (ref 13.0–17.0)
MCH: 30 pg (ref 26.0–34.0)
MCHC: 32 g/dL (ref 30.0–36.0)
MCV: 93.7 fL (ref 80.0–100.0)
Platelets: 223 10*3/uL (ref 150–400)
RBC: 4.9 MIL/uL (ref 4.22–5.81)
RDW: 14.3 % (ref 11.5–15.5)
WBC: 4.5 10*3/uL (ref 4.0–10.5)
nRBC: 0 % (ref 0.0–0.2)

## 2022-02-04 LAB — PROTIME-INR
INR: 1.3 — ABNORMAL HIGH (ref 0.8–1.2)
Prothrombin Time: 16.4 seconds — ABNORMAL HIGH (ref 11.4–15.2)

## 2022-02-04 LAB — APTT: aPTT: 34 seconds (ref 24–36)

## 2022-02-04 SURGERY — TURBT (TRANSURETHRAL RESECTION OF BLADDER TUMOR)
Anesthesia: General | Laterality: Bilateral

## 2022-02-04 MED ORDER — ORAL CARE MOUTH RINSE: 15.0000 mL | Freq: Once | OROMUCOSAL | Status: AC

## 2022-02-04 MED ORDER — PROMETHAZINE HCL 25 MG/ML IJ SOLN: 6.2500 mg | INTRAMUSCULAR | Status: DC | PRN

## 2022-02-04 MED ORDER — SUGAMMADEX SODIUM 200 MG/2ML IV SOLN
INTRAVENOUS | Status: DC | PRN
  Administered 2022-02-04: 200 mg via INTRAVENOUS

## 2022-02-04 MED ORDER — LACTATED RINGERS IV SOLN: INTRAVENOUS | Status: DC

## 2022-02-04 MED ORDER — MIDAZOLAM HCL 2 MG/2ML IJ SOLN
INTRAMUSCULAR | Status: AC
  Filled 2022-02-04: qty 2

## 2022-02-04 MED ORDER — PROPOFOL 10 MG/ML IV BOLUS
INTRAVENOUS | Status: AC
  Filled 2022-02-04: qty 20

## 2022-02-04 MED ORDER — ONDANSETRON HCL 4 MG/2ML IJ SOLN
INTRAMUSCULAR | Status: AC
  Filled 2022-02-04: qty 2

## 2022-02-04 MED ORDER — FENTANYL CITRATE PF 50 MCG/ML IJ SOSY: 25.0000 ug | PREFILLED_SYRINGE | INTRAMUSCULAR | Status: DC | PRN

## 2022-02-04 MED ORDER — MIDAZOLAM HCL 5 MG/5ML IJ SOLN
INTRAMUSCULAR | Status: DC | PRN
  Administered 2022-02-04: 2 mg via INTRAVENOUS

## 2022-02-04 MED ORDER — SODIUM CHLORIDE 0.9 % IR SOLN
Status: DC | PRN
  Administered 2022-02-04: 3000 mL

## 2022-02-04 MED ORDER — ONDANSETRON HCL 4 MG/2ML IJ SOLN
INTRAMUSCULAR | Status: DC | PRN
  Administered 2022-02-04: 4 mg via INTRAVENOUS

## 2022-02-04 MED ORDER — CEFAZOLIN SODIUM-DEXTROSE 2-4 GM/100ML-% IV SOLN
2.0000 g | INTRAVENOUS | Status: AC
  Administered 2022-02-04: 2 g via INTRAVENOUS
  Filled 2022-02-04: qty 100

## 2022-02-04 MED ORDER — TRAMADOL HCL 50 MG PO TABS
50.0000 mg | ORAL_TABLET | Freq: Once | ORAL | Status: AC
  Administered 2022-02-04: 50 mg via ORAL

## 2022-02-04 MED ORDER — TRAMADOL HCL 50 MG PO TABS
ORAL_TABLET | ORAL | Status: AC
  Filled 2022-02-04: qty 1

## 2022-02-04 MED ORDER — IOHEXOL 300 MG/ML  SOLN
INTRAMUSCULAR | Status: DC | PRN
  Administered 2022-02-04: 16 mL

## 2022-02-04 MED ORDER — DEXAMETHASONE SODIUM PHOSPHATE 10 MG/ML IJ SOLN
INTRAMUSCULAR | Status: AC
  Filled 2022-02-04: qty 1

## 2022-02-04 MED ORDER — CHLORHEXIDINE GLUCONATE 0.12 % MT SOLN
15.0000 mL | Freq: Once | OROMUCOSAL | Status: AC
  Administered 2022-02-04: 15 mL via OROMUCOSAL

## 2022-02-04 MED ORDER — FENTANYL CITRATE PF 50 MCG/ML IJ SOSY
PREFILLED_SYRINGE | INTRAMUSCULAR | Status: AC
  Filled 2022-02-04: qty 1

## 2022-02-04 MED ORDER — DEXAMETHASONE SODIUM PHOSPHATE 10 MG/ML IJ SOLN
INTRAMUSCULAR | Status: DC | PRN
  Administered 2022-02-04: 4 mg via INTRAVENOUS

## 2022-02-04 MED ORDER — FENTANYL CITRATE (PF) 100 MCG/2ML IJ SOLN
INTRAMUSCULAR | Status: AC
  Filled 2022-02-04: qty 2

## 2022-02-04 MED ORDER — LIDOCAINE 2% (20 MG/ML) 5 ML SYRINGE
INTRAMUSCULAR | Status: DC | PRN
  Administered 2022-02-04: 80 mg via INTRAVENOUS

## 2022-02-04 MED ORDER — FENTANYL CITRATE (PF) 100 MCG/2ML IJ SOLN
INTRAMUSCULAR | Status: DC | PRN
  Administered 2022-02-04: 50 ug via INTRAVENOUS

## 2022-02-04 MED ORDER — ROCURONIUM BROMIDE 10 MG/ML (PF) SYRINGE
PREFILLED_SYRINGE | INTRAVENOUS | Status: DC | PRN
  Administered 2022-02-04: 50 mg via INTRAVENOUS

## 2022-02-04 MED ORDER — PROPOFOL 10 MG/ML IV BOLUS
INTRAVENOUS | Status: DC | PRN
  Administered 2022-02-04: 160 mg via INTRAVENOUS

## 2022-02-04 MED ORDER — PHENYLEPHRINE 80 MCG/ML (10ML) SYRINGE FOR IV PUSH (FOR BLOOD PRESSURE SUPPORT)
PREFILLED_SYRINGE | INTRAVENOUS | Status: DC | PRN
  Administered 2022-02-04: 120 ug via INTRAVENOUS
  Administered 2022-02-04: 80 ug via INTRAVENOUS

## 2022-02-04 MED ORDER — 0.9 % SODIUM CHLORIDE (POUR BTL) OPTIME
TOPICAL | Status: DC | PRN
  Administered 2022-02-04: 1000 mL

## 2022-02-04 MED ORDER — MIDAZOLAM HCL 2 MG/2ML IJ SOLN: 1.0000 mg | Freq: Once | INTRAMUSCULAR | Status: AC

## 2022-02-04 MED ORDER — ACETAMINOPHEN 500 MG PO TABS
1000.0000 mg | ORAL_TABLET | Freq: Once | ORAL | Status: AC
  Administered 2022-02-04: 1000 mg via ORAL
  Filled 2022-02-04: qty 2

## 2022-02-04 MED ORDER — MIDAZOLAM HCL 2 MG/2ML IJ SOLN
INTRAMUSCULAR | Status: AC
  Administered 2022-02-04: 1 mg via INTRAVENOUS
  Filled 2022-02-04: qty 2

## 2022-02-04 SURGICAL SUPPLY — 21 items
BAG DRN RND TRDRP ANRFLXCHMBR (UROLOGICAL SUPPLIES)
BAG URINE DRAIN 2000ML AR STRL (UROLOGICAL SUPPLIES) IMPLANT
BAG URINE LEG 500ML (DRAIN) IMPLANT
BAG URO CATCHER STRL LF (MISCELLANEOUS) ×1 IMPLANT
CATH FOLEY 2WAY SLVR  5CC 18FR (CATHETERS)
CATH FOLEY 2WAY SLVR 5CC 18FR (CATHETERS) IMPLANT
DRAPE FOOT SWITCH (DRAPES) ×1 IMPLANT
ELECT REM PT RETURN 15FT ADLT (MISCELLANEOUS) ×1 IMPLANT
GLOVE BIO SURGEON STRL SZ7.5 (GLOVE) ×1 IMPLANT
GOWN STRL REUS W/ TWL XL LVL3 (GOWN DISPOSABLE) ×1 IMPLANT
GOWN STRL REUS W/TWL XL LVL3 (GOWN DISPOSABLE) ×1
GUIDEWIRE STR DUAL SENSOR (WIRE) IMPLANT
KIT TURNOVER KIT A (KITS) IMPLANT
LOOP CUT BIPOLAR 24F LRG (ELECTROSURGICAL) IMPLANT
MANIFOLD NEPTUNE II (INSTRUMENTS) ×1 IMPLANT
PACK CYSTO (CUSTOM PROCEDURE TRAY) ×1 IMPLANT
PLUG CATH AND CAP STER (CATHETERS) IMPLANT
SYR TOOMEY IRRIG 70ML (MISCELLANEOUS)
SYRINGE TOOMEY IRRIG 70ML (MISCELLANEOUS) IMPLANT
TUBING CONNECTING 10 (TUBING) ×1 IMPLANT
TUBING UROLOGY SET (TUBING) ×1 IMPLANT

## 2022-02-04 NOTE — Discharge Instructions (Addendum)
General instructions:     Your recent bladder surgery requires very little post hospital care but some definite precautions.  Despite the fact that no skin incisions were used, the area around the bladder incisions are raw and covered with scabs to promote healing and prevent bleeding. Certain precautions are needed to insure that the scabs are not disturbed over the next 2-4 weeks while the healing proceeds.  Because the raw surface inside your bladder and the irritating effects of urine you may expect frequency of urination and/or urgency (a stronger desire to urinate) and perhaps even getting up at night more often. This will usually resolve or improve slowly over the healing period. You may see some blood in your urine over the first 6 weeks. Do not be alarmed, even if the urine was clear for a while. Get off your feet and drink lots of fluids until clearing occurs. If you start to pass clots or don't improve call us.  Diet:  You may return to your normal diet immediately. Because of the raw surface of your bladder, alcohol, spicy foods, foods high in acid and drinks with caffeine may cause irritation or frequency and should be used in moderation. To keep your urine flowing freely and avoid constipation, drink plenty of fluids during the day (8-10 glasses). Tip: Avoid cranberry juice because it is very acidic.  Activity:  Your physical activity doesn't need to be restricted. However, if you are very active, you may see some blood in the urine. We suggest that you reduce your activity under the circumstances until the bleeding has stopped.  You may start the Coumadin back in 2 days on Wednesday  Bowels:  It is important to keep your bowels regular during the postoperative period. Straining with bowel movements can cause bleeding. A bowel movement every other day is reasonable. Use a mild laxative if needed, such as milk of magnesia 2-3 tablespoons, or 2 Dulcolax tablets. Call if you continue  to have problems. If you had been taking narcotics for pain, before, during or after your surgery, you may be constipated. Take a laxative if necessary.    Medication:  You should resume your pre-surgery medications unless told not to. In addition you may be given an antibiotic to prevent or treat infection. Antibiotics are not always necessary. All medication should be taken as prescribed until the bottles are finished unless you are having an unusual reaction to one of the drugs.

## 2022-02-04 NOTE — Transfer of Care (Signed)
Immediate Anesthesia Transfer of Care Note  Patient: [name]  Procedure(s) Performed: CYSTOSCOPY BILATERAL RETROGRADE PYELOGRAM, LEFT DIAGNOSTIC URETEROSCOPY (Bilateral)  Patient Location: PACU  Anesthesia Type:General  Level of Consciousness: drowsy  Airway & Oxygen Therapy: Patient Spontanous Breathing and Patient connected to face mask oxygen  Post-op Assessment: Report given to RN and Post -op Vital signs reviewed and stable  Post vital signs: Reviewed and stable  Last Vitals:  Vitals Value Taken Time  BP 114/75 02/04/22 1404  Temp    Pulse 83 02/04/22 1407  Resp 11 02/04/22 1407  SpO2 100 % 02/04/22 1407  Vitals shown include unvalidated device data.  Last Pain:  Vitals:   02/04/22 1113  TempSrc:   PainSc: 0-No pain         Complications: No notable events documented.

## 2022-02-04 NOTE — Op Note (Signed)
Operative Note  Preoperative diagnosis:  1.  Possible bladder tumor  Postoperative diagnosis: 1.  Abnormal radiographic finding of the bladder  Procedure(s): 1.  Cystoscopy with bilateral retrograde pyelogram 2.  Left diagnostic ureteroscopy  Surgeon: Link Snuffer, MD  Assistants: None  Anesthesia: General  Complications: None immediate  EBL: Minimal  Specimens: 1.  None  Drains/Catheters: 1.  None  Intraoperative findings: 1.  Normal anterior urethra 2.  High riding bladder neck with no prostatic obstruction 3.  Bilateral ureteral orifices were in orthotopic position.  There were no bladder tumors or stones. 4.  Right retrograde pyelogram without any filling defect or hydronephrosis. 5.  Left retrograde pyelogram with some transition point areas of narrowing in the mid to distal ureter.  Ureteroscopy revealed no abnormalities.  Indication: 42 year old male found on imaging to have a possible bladder lesion presents for the previously mentioned operation.  Description of procedure:  The patient was identified and consent was obtained.  The patient was taken to the operating room and placed in the supine position.  The patient was placed under general anesthesia.  Perioperative antibiotics were administered.  The patient was placed in dorsal lithotomy.  Patient was prepped and draped in a standard sterile fashion and a timeout was performed.  A 21 French rigid cystoscope was advanced into the urethra and into the bladder.  Complete cystoscopy was performed with no abnormal findings.  I exchanged to the 70 degree lens from the 30 degree lens inspected the bladder neck with no abnormal findings.  I changed back to the 30 degree lens and intubated the left ureteral orifice and shot a retrograde pyelogram with findings noted above.  Same was performed on the right.  There were no abnormalities on the right.  I did note some areas of narrowing in the mid to distal left ureter with  multiple transition points.  I therefore advanced a wire up the left ureter and into the kidney under fluoroscopic guidance.  A semirigid ureteroscope was advanced alongside the wire.  There was some annular areas of very mild narrowing but easily transversed with the scope without any trauma.  I advanced the scope up to the renal pelvis and no calculi or tumors were seen.  I withdrew the scope visualizing the lumen upon removal and again no trauma to the ureter was seen and no significant stricture, stone, or tumor was identified.  Scope and wire were withdrawn.  This concluded the operation.  Patient tolerated the procedure well was stable postoperative.  Plan: He may follow-up in 1 month with a renal ultrasound.

## 2022-02-04 NOTE — Anesthesia Procedure Notes (Signed)
Procedure Name: Intubation Date/Time: 02/04/2022 12:56 PM  Performed by: West Pugh, CRNAPre-anesthesia Checklist: Patient identified, Emergency Drugs available, Suction available, Patient being monitored and Timeout performed Patient Re-evaluated:Patient Re-evaluated prior to induction Oxygen Delivery Method: Circle system utilized Preoxygenation: Pre-oxygenation with 100% oxygen Induction Type: IV induction Ventilation: Mask ventilation without difficulty Laryngoscope Size: Glidescope and 4 Grade View: Grade I Tube type: Oral Tube size: 7.0 mm Number of attempts: 1 Airway Equipment and Method: Rigid stylet and Video-laryngoscopy Placement Confirmation: ETT inserted through vocal cords under direct vision, positive ETCO2, CO2 detector and breath sounds checked- equal and bilateral Secured at: 22 cm Tube secured with: Tape Dental Injury: Teeth and Oropharynx as per pre-operative assessment

## 2022-02-04 NOTE — H&P (Signed)
CC/HPI: 05/14/19: Patient was seen in the past by Dr. Gloriann Loan in consult for meatal stenosis, while intubated in ICU. Dilation was performed for successful catheter placement. He presents to clinic today following recent emergency department visit. He was seen on 4/12 with complaints of generalized lower abdominal pain with associated nausea. UA was positive for nitrites, had 21-50 RBC, and few bacteria. WBC- 12.1. creatinine- 0.88. He underwent CT imaging, which revealed an approximately 2-3 mm left distal ureteral calculus with hydronephrosis. Case was discussed with Dr. Claudia Desanctis (on call) due to concerning UA. She recommended discharge home with antibiotics, Flomax, and analgesics. He was d/c ON Keflex. Urine culture ultimately revealed mixed growth. He returns to clinic today for ongoing follow-up. He is with his caretaker today. Does continue to complain of some left lower back pain, which is intermittent. The pain is mild. He is not having to use any pain medication for this. He denies abdominal pain. No complaints of gross hematuria. No dysuria. He denies fevers, chills, nausea, or vomiting. He does have issues of constipation and has not had a bowel movement in several days. He uses a condom catheter at baseline. Denies past history stones. He does have past history of chronic cystitis, which is currently managed on nitrofurantoin prophylaxis. He remains on cephalexin that he was given at the emergency department in continues to tolerate this well.   PMH: Cerebral palsy, chronic constipation, hypertension, prior small bowel obstructions, prior DVT (on Coumadin), and prior laparotomy    05/28/19: Patient with above noted hx. He returns today for follow up with RUS imaging. Urine culture at prior OV showed mixed growth of organisms. Today he states that he did see a stone pass in the interim. He states that symptoms have resolved following passage of stone material. He denies any current flank or abdominal pain.  No complaints of gross hematuria. He denies difficulties voiding her pain associated with urination. No complaints of fevers, chills, nausea, or vomiting. He is now back on prophylactic nitrofurantoin. He is no longer using the tamsulosin.   11/26/2019: U/S at last OV showed no overt obstructive signs. Stable renal cysts. His symptoms had improved since interval stone material passage. Stone analysis was mostly calcium oxalate composition.   Today doing well, here with caregiver. Voiding symptoms continue to be managed with use of a condom catheter. He does well with this. No interval gross hematuria. He is doing well especially with hydration. No pain or discomfort suggestive of obstructive uropathy. Denies interval stone material passage, no interval treatment for UTI.   11/30/2020  No stone events since I last saw the patient. No problems with voiding. Continues to use a condom catheter and has been managing his bladder well with this. Renal ultrasound today showed no evidence of hydronephrosis or stones. He had a small cyst in the left kidney.   12/31/2021: Patient here today for follow-up exam with renal ultrasound. He continues to use a condom catheter to manage underlying lower urinary tract symptomology in someone with known cerebral palsy. Ultrasound today shows stable but small bilateral renal cysts. Also some questionable opacities consistent either with vascular calcifications versus nonobstructing small renal calculi. Of note patient had no obvious GU abnormality including absence of stones on CT imaging from April of this year when he was hospitalized for small bowel obstruction. There is also a questionable versus benign physiological process within the bladder, this was also present on last year's u/s to a somewhat larger degree.   Overall doing well.  Condom catheter works well for him. No recent UTI treatment, gross hematuria, stone material passage or correlating pain/discomfort  suggestive of obstructive uropathy.   01/07/2022: Imaging reviewed with Dr. Gloriann Loan after last office visit. There is a suspicious lesion within the lumen of the bladder cannot be completely determined as benign based on ultrasound imaging obtained last week. He is here today with his uncle, caregiver for further discussion. Dr. Gloriann Loan has recommended cystoscopic examination under anesthesia.     ALLERGIES: Betadine Cipro Morphine    MEDICATIONS: Hydrochlorothiazide 12.5 mg tablet  Metoprolol Succinate 100 mg tablet, extended release 24 hr  Warfarin Sodium 3 mg tablet  Antacid  Baclofen 20 mg tablet  Banophen 25 mg tablet  Capsaicin 0.025 % cream  Cough Med  Cyclobenzaprine Hcl 10 mg tablet  Diazepam 2 mg tablet  Duoderm Cgf 4" x 4" bandage  Enoxaparin Sodium 80 mg/0.8 ml syringe  Flonase Allergy Relief  Furosemide 20 mg tablet  Gel-Kam 0.4 % gel  Geri-Tussin 100 mg/5 ml liquid  Glycerin adult suppository, rectal  Jobst Anti-Embolism Stocking  Loperamide 2 mg tablet  Multivitamin With Minerals  Nitrofurantoin 50 mg capsule  Nyamyc 100,000 unit/gram powder  Ondansetron Odt 4 mg tablet,disintegrating  Senexon-S 8.6 mg-50 mg tablet  Tizanidine Hcl 2 mg capsule  Tramadol Hcl 50 mg tablet  Tylenol 325 mg tablet  Vitamin D3     GU PSH: No GU PSH      PSH Notes: 2019 laparoscopic, 2000 bowel resection,    NON-GU PSH: Hernia Repair     GU PMH: History of urolithiasis - 12/31/2021, - 11/30/2020, - 2021 Other Disorders Of Bladder - 12/31/2021, - 11/30/2020 Renal cyst - 12/31/2021, - 11/30/2020, - 2021 Renal calculus    NON-GU PMH: Bacteriuria - 2021 DVT, History Hypertension Other cerebral palsy    FAMILY HISTORY: kidney stone - Mother   SOCIAL HISTORY: Marital Status: Single Preferred Language: English; Race: White Current Smoking Status: Patient has never smoked.   Tobacco Use Assessment Completed: Used Tobacco in last 30 days? Has never drank.  Drinks 1 caffeinated  drink per day. Patient's occupation is/was Disabled.    REVIEW OF SYSTEMS:    GU Review Male:   Patient denies frequent urination, hard to postpone urination, burning/ pain with urination, get up at night to urinate, leakage of urine, stream starts and stops, trouble starting your stream, have to strain to urinate , erection problems, and penile pain.  Gastrointestinal (Upper):   Patient denies nausea, vomiting, and indigestion/ heartburn.  Gastrointestinal (Lower):   Patient denies diarrhea and constipation.  Constitutional:   Patient denies fever, night sweats, weight loss, and fatigue.  Skin:   Patient denies skin rash/ lesion and itching.  Eyes:   Patient denies blurred vision and double vision.  Ears/ Nose/ Throat:   Patient denies sore throat and sinus problems.  Hematologic/Lymphatic:   Patient denies swollen glands and easy bruising.  Cardiovascular:   Patient denies leg swelling and chest pains.  Respiratory:   Patient denies cough and shortness of breath.  Endocrine:   Patient denies excessive thirst.  Musculoskeletal:   Patient denies back pain and joint pain.  Neurological:   Patient denies headaches and dizziness.  Psychologic:   Patient denies depression and anxiety.   VITAL SIGNS:      01/07/2022 02:13 PM 01/07/2022 02:08 PM  BP   112/77 mmHg  Pulse 97 /min    Temperature   98.3 F / 36.8 C   GU PHYSICAL EXAMINATION:  Notes: Condom catheter in place draining yellow appearing urine well.    MULTI-SYSTEM PHYSICAL EXAMINATION:    Constitutional: Moderate physical deformities. Well-nourished. Normally developed. In power chair.   Neurologic / Psychiatric: Oriented to time, oriented to place, oriented to person. No depression, no anxiety, no agitation. Does respond and answer questions appropriately with aid of his caregiver. He will spell words out for you.   Musculoskeletal: Left lower extremity contracture present, spastic, abnormal movements. Right lower extremity  contracture present, spastic, abnormal movements. Normal gait and station of head and neck.     Complexity of Data:  Source Of History:  Patient, Family/Caregiver, Medical Record Summary  Records Review:   Previous Doctor Records, Previous Hospital Records, Previous Patient Records  X-Ray Review: Renal Ultrasound: Reviewed Films. Discussed With Patient.     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 Gloriann Loan under anesthesia in the near future to further identify suspicious bladder lesion noted on most recent ultrasound imaging performed here in clinic. He would need clearance from primary care as he is on Coumadin. All questions answered to the best of my ability regarding the procedure and its expected postoperative course with understanding expressed by patient and caregiver/family member present today.        Next Appointment:      Next Appointment: 01/03/2023 08:15 AM    Appointment Type: Renal Ultrasound    Location: [facility] [phone]    Provider: Radiology Rm1 Radiology Rm 1    Reason for Visit: 1 yr rus, ov, no urine      Signed by Jiles Crocker, NP on 01/07/22 at 2:35 PM (EST

## 2022-02-04 NOTE — Interval H&P Note (Signed)
History and Physical Interval Note:  02/04/2022 11:46 AM  [name]  has presented today for surgery, with the diagnosis of BLADDER TUMOR.  The various methods of treatment have been discussed with the patient and family. After consideration of risks, benefits and other options for treatment, the patient has consented to  Procedure(s) with comments: TRANSURETHRAL RESECTION OF BLADDER TUMOR (TURBT) CYSTOSCOPY BILATERAL RETROGRADE PYELOGRAM (Bilateral) - 1 HR FOR CASE as a surgical intervention.  The patient's history has been reviewed, patient examined, no change in status, stable for surgery.  I have reviewed the patient's chart and labs.  Questions were answered to the patient's satisfaction.     Marton Redwood, III

## 2022-02-04 NOTE — Anesthesia Preprocedure Evaluation (Addendum)
Anesthesia Evaluation  Patient identified by MRN, date of birth, ID band Patient awake    Reviewed: Allergy & Precautions, NPO status , Patient's Chart, lab work & pertinent test results, reviewed documented beta blocker date and time   Airway Mallampati: IV  TM Distance: >3 FB Neck ROM: Full    Dental  (+) Teeth Intact, Dental Advisory Given   Pulmonary neg pulmonary ROS   Pulmonary exam normal breath sounds clear to auscultation       Cardiovascular hypertension, Pt. on home beta blockers and Pt. on medications + DVT   Rhythm:Regular Rate:Tachycardia     Neuro/Psych CP  negative psych ROS   GI/Hepatic negative GI ROS, Neg liver ROS,,,  Endo/Other  negative endocrine ROS    Renal/GU negative Renal ROS   Bladder tumor    Musculoskeletal negative musculoskeletal ROS (+)    Abdominal   Peds  Hematology  (+) Blood dyscrasia (Warfarin)   Anesthesia Other Findings Day of surgery medications reviewed with the patient.  Reproductive/Obstetrics                             Anesthesia Physical Anesthesia Plan  ASA: 3  Anesthesia Plan: General   Post-op Pain Management: Ofirmev IV (intra-op)*   Induction: Intravenous  PONV Risk Score and Plan: 3 and Midazolam, Dexamethasone and Ondansetron  Airway Management Planned: Oral ETT and Video Laryngoscope Planned  Additional Equipment:   Intra-op Plan:   Post-operative Plan: Extubation in OR  Informed Consent: I have reviewed the patients History and Physical, chart, labs and discussed the procedure including the risks, benefits and alternatives for the proposed anesthesia with the patient or authorized representative who has indicated his/her understanding and acceptance.     Dental advisory given  Plan Discussed with: CRNA  Anesthesia Plan Comments:        Anesthesia Quick Evaluation

## 2022-02-05 ENCOUNTER — Encounter (HOSPITAL_COMMUNITY): Payer: Self-pay | Admitting: Urology

## 2022-02-05 NOTE — Anesthesia Postprocedure Evaluation (Signed)
Anesthesia Post Note  Patient: [name]  Procedure(s) Performed: CYSTOSCOPY BILATERAL RETROGRADE PYELOGRAM, LEFT DIAGNOSTIC URETEROSCOPY (Bilateral)     Patient location during evaluation: PACU Anesthesia Type: General Level of consciousness: awake and alert Pain management: pain level controlled Vital Signs Assessment: post-procedure vital signs reviewed and stable Respiratory status: spontaneous breathing, nonlabored ventilation, respiratory function stable and patient connected to nasal cannula oxygen Cardiovascular status: blood pressure returned to baseline and stable Postop Assessment: no apparent nausea or vomiting Anesthetic complications: no   No notable events documented.  Last Vitals:  Vitals:   02/04/22 1445 02/04/22 1531  BP: 119/79 113/72  Pulse: 82   Resp: 13 14  Temp: 36.7 C   SpO2: 98% 98%    Last Pain:  Vitals:   02/04/22 1531  TempSrc:   PainSc: [place]

## 2022-12-27 ENCOUNTER — Other Ambulatory Visit: Payer: Self-pay

## 2022-12-27 ENCOUNTER — Emergency Department (HOSPITAL_COMMUNITY): Payer: Medicare Other

## 2022-12-27 ENCOUNTER — Observation Stay (HOSPITAL_COMMUNITY)
Admission: EM | Admit: 2022-12-27 | Discharge: 2022-12-28 | Disposition: A | Discharge status: Home / Self Care | Payer: Medicare Other | Source: Home / Self Care | Attending: Emergency Medicine | Admitting: Emergency Medicine

## 2022-12-27 ENCOUNTER — Encounter (HOSPITAL_COMMUNITY): Payer: Self-pay | Admitting: Emergency Medicine

## 2022-12-27 DIAGNOSIS — I10 Essential (primary) hypertension: Secondary | ICD-10-CM

## 2022-12-27 DIAGNOSIS — Z86718 Personal history of other venous thrombosis and embolism: Secondary | ICD-10-CM | POA: Insufficient documentation

## 2022-12-27 DIAGNOSIS — Z7901 Long term (current) use of anticoagulants: Secondary | ICD-10-CM | POA: Insufficient documentation

## 2022-12-27 DIAGNOSIS — Z79899 Other long term (current) drug therapy: Secondary | ICD-10-CM | POA: Insufficient documentation

## 2022-12-27 DIAGNOSIS — E872 Acidosis, unspecified: Secondary | ICD-10-CM

## 2022-12-27 DIAGNOSIS — Z1152 Encounter for screening for COVID-19: Secondary | ICD-10-CM | POA: Insufficient documentation

## 2022-12-27 DIAGNOSIS — K56600 Partial intestinal obstruction, unspecified as to cause: Secondary | ICD-10-CM | POA: Diagnosis not present

## 2022-12-27 DIAGNOSIS — I4891 Unspecified atrial fibrillation: Secondary | ICD-10-CM | POA: Diagnosis present

## 2022-12-27 DIAGNOSIS — G809 Cerebral palsy, unspecified: Secondary | ICD-10-CM

## 2022-12-27 DIAGNOSIS — R109 Unspecified abdominal pain: Secondary | ICD-10-CM | POA: Diagnosis not present

## 2022-12-27 DIAGNOSIS — I480 Paroxysmal atrial fibrillation: Secondary | ICD-10-CM

## 2022-12-27 DIAGNOSIS — R000 Tachycardia, unspecified: Secondary | ICD-10-CM | POA: Insufficient documentation

## 2022-12-27 DIAGNOSIS — K56609 Unspecified intestinal obstruction, unspecified as to partial versus complete obstruction: Secondary | ICD-10-CM | POA: Diagnosis not present

## 2022-12-27 DIAGNOSIS — K5900 Constipation, unspecified: Secondary | ICD-10-CM | POA: Diagnosis present

## 2022-12-27 LAB — RESP PANEL BY RT-PCR (RSV, FLU A&B, COVID)  RVPGX2
Influenza A by PCR: NEGATIVE
Influenza B by PCR: NEGATIVE
Resp Syncytial Virus by PCR: NEGATIVE
SARS Coronavirus 2 by RT PCR: NEGATIVE

## 2022-12-27 LAB — COMPREHENSIVE METABOLIC PANEL
ALT: 15 U/L (ref 0–44)
AST: 21 U/L (ref 15–41)
Albumin: 3.9 g/dL (ref 3.5–5.0)
Alkaline Phosphatase: 62 U/L (ref 38–126)
Anion gap: 10 (ref 5–15)
BUN: 13 mg/dL (ref 6–20)
CO2: 27 mmol/L (ref 22–32)
Calcium: 8.9 mg/dL (ref 8.9–10.3)
Chloride: 104 mmol/L (ref 98–111)
Creatinine, Ser: 0.91 mg/dL (ref 0.61–1.24)
GFR, Estimated: >60 mL/min (ref >60)
Glucose, Bld: 105 mg/dL — ABNORMAL HIGH (ref 70–99)
Potassium: 4.4 mmol/L (ref 3.5–5.1)
Sodium: 141 mmol/L (ref 135–145)
Total Bilirubin: 1.3 mg/dL — ABNORMAL HIGH (ref <1.2)
Total Protein: 7.1 g/dL (ref 6.5–8.1)

## 2022-12-27 LAB — CBC WITH DIFFERENTIAL/PLATELET
Abs Immature Granulocytes: 0.04 10*3/uL (ref 0.00–0.07)
Basophils Absolute: 0 10*3/uL (ref 0.0–0.1)
Basophils Relative: 0 %
Eosinophils Absolute: 0.2 10*3/uL (ref 0.0–0.5)
Eosinophils Relative: 2 %
HCT: 47.3 % (ref 39.0–52.0)
Hemoglobin: 15.3 g/dL (ref 13.0–17.0)
Immature Granulocytes: 0 %
Lymphocytes Relative: 9 %
Lymphs Abs: 0.9 10*3/uL (ref 0.7–4.0)
MCH: 30.7 pg (ref 26.0–34.0)
MCHC: 32.3 g/dL (ref 30.0–36.0)
MCV: 94.8 fL (ref 80.0–100.0)
Monocytes Absolute: 0.7 10*3/uL (ref 0.1–1.0)
Monocytes Relative: 7 %
Neutro Abs: 7.9 10*3/uL — ABNORMAL HIGH (ref 1.7–7.7)
Neutrophils Relative %: 82 %
Platelets: 212 10*3/uL (ref 150–400)
RBC: 4.99 MIL/uL (ref 4.22–5.81)
RDW: 14.4 % (ref 11.5–15.5)
WBC: 9.7 10*3/uL (ref 4.0–10.5)
nRBC: 0 % (ref 0.0–0.2)

## 2022-12-27 LAB — URINALYSIS, W/ REFLEX TO CULTURE (INFECTION SUSPECTED)
Bilirubin Urine: NEGATIVE
Glucose, UA: NEGATIVE mg/dL
Ketones, ur: 20 mg/dL — AB
Leukocytes,Ua: NEGATIVE
Nitrite: POSITIVE — AB
Protein, ur: 30 mg/dL — AB
pH: 6 (ref 5.0–8.0)

## 2022-12-27 LAB — I-STAT CG4 LACTIC ACID, ED: Lactic Acid, Venous: 3.7 mmol/L (ref 0.5–1.9)

## 2022-12-27 LAB — APTT: aPTT: 28 s (ref 24–36)

## 2022-12-27 LAB — I-STAT CHEM 8, ED
BUN: 14 mg/dL (ref 6–20)
Calcium, Ion: 1.14 mmol/L — ABNORMAL LOW (ref 1.15–1.40)
Chloride: 103 mmol/L (ref 98–111)
Creatinine, Ser: 1 mg/dL (ref 0.61–1.24)
Glucose, Bld: 108 mg/dL — ABNORMAL HIGH (ref 70–99)
HCT: 47 % (ref 39.0–52.0)
Hemoglobin: 16 g/dL (ref 13.0–17.0)
Potassium: 4.6 mmol/L (ref 3.5–5.1)
Sodium: 142 mmol/L (ref 135–145)
TCO2: 28 mmol/L (ref 22–32)

## 2022-12-27 LAB — LACTIC ACID, PLASMA: Lactic Acid, Venous: 1.9 mmol/L (ref 0.5–1.9)

## 2022-12-27 LAB — PROTIME-INR
INR: 1.6 — ABNORMAL HIGH (ref 0.8–1.2)
Prothrombin Time: 19.1 s — ABNORMAL HIGH (ref 11.4–15.2)

## 2022-12-27 LAB — LIPASE, BLOOD: Lipase: 26 U/L (ref 11–51)

## 2022-12-27 MED ORDER — DIAZEPAM 2 MG PO TABS
2.0000 mg | ORAL_TABLET | Freq: Once | ORAL | Status: AC
  Administered 2022-12-27: 2 mg via ORAL
  Filled 2022-12-27: qty 1

## 2022-12-27 MED ORDER — KCL IN DEXTROSE-NACL 20-5-0.45 MEQ/L-%-% IV SOLN
INTRAVENOUS | Status: AC
Start: 2022-12-27 — End: 2022-12-28
  Filled 2022-12-27 (×3): qty 1000

## 2022-12-27 MED ORDER — HEPARIN BOLUS VIA INFUSION
4000.0000 [IU] | Freq: Once | INTRAVENOUS | Status: AC
  Administered 2022-12-27: 4000 [IU] via INTRAVENOUS
  Filled 2022-12-27: qty 4000

## 2022-12-27 MED ORDER — HYDRALAZINE HCL 20 MG/ML IJ SOLN: 10.0000 mg | Freq: Four times a day (QID) | INTRAMUSCULAR | Status: DC | PRN

## 2022-12-27 MED ORDER — ONDANSETRON HCL 4 MG/2ML IJ SOLN
4.0000 mg | Freq: Four times a day (QID) | INTRAMUSCULAR | Status: DC | PRN
  Administered 2022-12-27: 4 mg via INTRAVENOUS
  Filled 2022-12-27: qty 2

## 2022-12-27 MED ORDER — ORAL CARE MOUTH RINSE: 15.0000 mL | OROMUCOSAL | Status: DC | PRN

## 2022-12-27 MED ORDER — HEPARIN (PORCINE) 25000 UT/250ML-% IV SOLN
1000.0000 [IU]/h | INTRAVENOUS | Status: DC
  Administered 2022-12-27: 1000 [IU]/h via INTRAVENOUS
  Filled 2022-12-27: qty 250

## 2022-12-27 MED ORDER — IOHEXOL 300 MG/ML  SOLN
100.0000 mL | Freq: Once | INTRAMUSCULAR | Status: AC | PRN
  Administered 2022-12-27: 100 mL via INTRAVENOUS

## 2022-12-27 MED ORDER — ACETAMINOPHEN 650 MG RE SUPP: 650.0000 mg | Freq: Four times a day (QID) | RECTAL | Status: DC | PRN

## 2022-12-27 MED ORDER — METHOCARBAMOL 1000 MG/10ML IJ SOLN
500.0000 mg | Freq: Four times a day (QID) | INTRAMUSCULAR | Status: DC | PRN
  Administered 2022-12-28: 500 mg via INTRAVENOUS
  Filled 2022-12-27: qty 10

## 2022-12-27 MED ORDER — ACETAMINOPHEN 325 MG PO TABS
650.0000 mg | ORAL_TABLET | Freq: Once | ORAL | Status: AC
  Administered 2022-12-27: 650 mg via ORAL
  Filled 2022-12-27: qty 2

## 2022-12-27 MED ORDER — METOPROLOL TARTRATE 5 MG/5ML IV SOLN
2.5000 mg | Freq: Four times a day (QID) | INTRAVENOUS | Status: DC | PRN
Start: 2022-12-27 — End: 2022-12-28

## 2022-12-27 MED ORDER — FLEET ENEMA RE ENEM
1.0000 | ENEMA | Freq: Once | RECTAL | Status: AC
  Administered 2022-12-27: 1 via RECTAL
  Filled 2022-12-27: qty 1

## 2022-12-27 MED ORDER — ONDANSETRON HCL 4 MG PO TABS: 4.0000 mg | ORAL_TABLET | Freq: Four times a day (QID) | ORAL | Status: DC | PRN

## 2022-12-27 MED ORDER — LACTATED RINGERS IV BOLUS
1000.0000 mL | Freq: Once | INTRAVENOUS | Status: AC
  Administered 2022-12-27: 1000 mL via INTRAVENOUS

## 2022-12-27 NOTE — ED Notes (Signed)
I-stat Lactic- 3.72 RN and Provider notified

## 2022-12-27 NOTE — ED Notes (Signed)
ED TO INPATIENT HANDOFF REPORT  Name/Age/Gender [name] 42 y.o. male  Code Status    Code Status Orders  (From admission, onward)           Start     Ordered   12/27/22 1403  Full code  Continuous       Question:  By:  Answer:  Consent: discussion documented in EHR   12/27/22 1405           Code Status History     Date Active Date Inactive Code Status Order ID Comments User Context   05/14/2021 1813 05/18/2021 2225 Full Code [id number]  Teddy Spike, DO Inpatient   02/16/2019 1934 02/25/2019 2026 Full Code [id number]  Diamantina Monks, MD ED   02/14/2018 1508 02/18/2018 2038 Full Code [id number]  Myrtie Neither, MD Inpatient   12/14/2017 2227 12/28/2017 1912 Full Code [id number]  Marcelle Smiling, MD ED   05/14/2015 0324 05/16/2015 1545 Full Code [id number]  Alberteen Sam, MD Inpatient   06/03/2011 1305 06/11/2011 0042 Full Code [id number]  Dorothea Ogle, MD ED       Home/SNF/Other Home  Chief Complaint Bowel obstruction [facility]) [K56.609]  Level of Care/Admitting Diagnosis ED Disposition     ED Disposition  Admit   Condition  --   Comment  Hospital Area: [facility] [[id number]]  Level of Care: Telemetry [5]  Admit to tele based on following criteria: Other see comments  Comments: bowel obstruction  May admit patient to [facility] or [facility] if equivalent level of care is available:: No  Covid Evaluation: Asymptomatic - no recent exposure (last 10 days) testing not required  Diagnosis: Bowel obstruction [facility]) [[id number]]  Admitting Physician: Joycelyn Das [[id number]]  Attending Physician: Joycelyn Das [[id number]]  Certification:: I certify this patient will need inpatient services for at least 2 midnights  Expected Medical Readiness: 12/30/2022          Medical History Past Medical History:  Diagnosis Date   Cerebral palsy (HCC)    Chronic constipation    Coagulopathy (HCC)    DVT (deep venous thrombosis) (HCC)     Dysrhythmia    Hypertension    Insomnia    Pneumonia    Recurrent UTI    Tachycardia     Allergies Allergies  Allergen Reactions   Betadine [Povidone Iodine] Other (See Comments)    Unknown reaction per caregiver   Ciprofloxacin Other (See Comments)    Unknown reaction per caregiver   Fentanyl Other (See Comments)    Previous reaction resulting in intubation, unclear details per family   Morphine Other (See Comments)    Unknown reaction per caregiver    IV Location/Drains/Wounds Patient Lines/Drains/Airways Status     Active Line/Drains/Airways     Name Placement date Placement time Site Days   Peripheral IV 12/27/22 20 G 1.88" Anterior;Right;Lateral Forearm 12/27/22  1143  Forearm  less than 1   External Urinary Catheter 05/14/21  1830  --  592   External Urinary Catheter 02/04/22  1337  --  326   Pressure Injury 12/23/17 Stage II -  Partial thickness loss of dermis presenting as a shallow open ulcer with a red, pink wound bed without slough. 12/23/17  1955  -- 1830            Labs/Imaging Results for orders placed or performed during the hospital encounter of 12/27/22 (from the past 48 hour(s))  Comprehensive metabolic panel     Status: Abnormal  Collection Time: 12/27/22  9:11 AM  Result Value Ref Range   Sodium 141 135 - 145 mmol/L   Potassium 4.4 3.5 - 5.1 mmol/L   Chloride 104 98 - 111 mmol/L   CO2 27 22 - 32 mmol/L   Glucose, Bld 105 (H) 70 - 99 mg/dL    Comment: Glucose reference range applies only to samples taken after fasting for at least 8 hours.   BUN 13 6 - 20 mg/dL   Creatinine, Ser 6.04 0.61 - 1.24 mg/dL   Calcium 8.9 8.9 - 54.0 mg/dL   Total Protein 7.1 6.5 - 8.1 g/dL   Albumin 3.9 3.5 - 5.0 g/dL   AST 21 15 - 41 U/L   ALT 15 0 - 44 U/L   Alkaline Phosphatase 62 38 - 126 U/L   Total Bilirubin 1.3 (H) <1.2 mg/dL   GFR, Estimated >98 >11 mL/min    Comment: (NOTE) Calculated using the CKD-EPI Creatinine Equation (2021)    Anion gap 10 5 -  15    Comment: Performed at [facility], [address], [place], [place] [address]  CBC with Differential     Status: Abnormal   Collection Time: 12/27/22  9:11 AM  Result Value Ref Range   WBC 9.7 4.0 - 10.5 K/uL   RBC 4.99 4.22 - 5.81 MIL/uL   Hemoglobin 15.3 13.0 - 17.0 g/dL   HCT 29.5 62.1 - 30.8 %   MCV 94.8 80.0 - 100.0 fL   MCH 30.7 26.0 - 34.0 pg   MCHC 32.3 30.0 - 36.0 g/dL   RDW 65.7 84.6 - 96.2 %   Platelets 212 150 - 400 K/uL   nRBC 0.0 0.0 - 0.2 %   Neutrophils Relative % 82 %   Neutro Abs 7.9 (H) 1.7 - 7.7 K/uL   Lymphocytes Relative 9 %   Lymphs Abs 0.9 0.7 - 4.0 K/uL   Monocytes Relative 7 %   Monocytes Absolute 0.7 0.1 - 1.0 K/uL   Eosinophils Relative 2 %   Eosinophils Absolute 0.2 0.0 - 0.5 K/uL   Basophils Relative 0 %   Basophils Absolute 0.0 0.0 - 0.1 K/uL   Immature Granulocytes 0 %   Abs Immature Granulocytes 0.04 0.00 - 0.07 K/uL    Comment: Performed at [facility], [address], [place], [place] [address]  Protime-INR     Status: Abnormal   Collection Time: 12/27/22  9:11 AM  Result Value Ref Range   Prothrombin Time 19.1 (H) 11.4 - 15.2 seconds   INR 1.6 (H) 0.8 - 1.2    Comment: (NOTE) INR goal varies based on device and disease states. Performed at [facility], [address] [place], [place] [address]   APTT     Status: None   Collection Time: 12/27/22  9:11 AM  Result Value Ref Range   aPTT 28 24 - 36 seconds    Comment: Performed at [facility], [address], [place], [place] [address]  Lipase, blood     Status: None   Collection Time: 12/27/22  9:11 AM  Result Value Ref Range   Lipase 26 11 - 51 U/L    Comment: Performed at [facility], [address], [place], [place] [address]  Resp panel by RT-PCR (RSV, Flu A&B, Covid) Anterior Nasal Swab     Status: None   Collection Time: 12/27/22  9:39 AM   Specimen: Anterior Nasal Swab  Result Value Ref Range   SARS Coronavirus 2 by RT PCR NEGATIVE NEGATIVE    Comment: (NOTE) SARS-CoV-2 target nucleic acids are NOT DETECTED.  The SARS-CoV-2 RNA is generally detectable in upper respiratory specimens during the acute phase of infection. The lowest concentration of SARS-CoV-2 viral copies this assay can detect is 138 copies/mL. A negative result does not preclude SARS-Cov-2 infection and should not be used as the sole basis for treatment or other patient management decisions. A negative result may occur with  improper specimen collection/handling, submission of specimen other than nasopharyngeal swab, presence of viral mutation(s) within the areas targeted by this assay, and inadequate number of viral copies(<138 copies/mL). A negative result must be combined with clinical observations, patient history, and epidemiological information. The expected result is Negative.  Fact Sheet for Patients:  BloggerCourse.com  Fact Sheet for Healthcare Providers:  SeriousBroker.it  This test is no t yet approved or cleared by the [place] FDA and  has been authorized for detection and/or diagnosis of SARS-CoV-2 by FDA under an Emergency Use Authorization (EUA). This EUA will remain  in effect (meaning this test can be used) for the duration of the COVID-19 declaration under Section 564(b)(1) of the Act, 21 U.S.C.section 360bbb-3(b)(1), unless the authorization is terminated  or revoked sooner.       Influenza A by PCR NEGATIVE NEGATIVE   Influenza B by PCR NEGATIVE NEGATIVE    Comment: (NOTE) The Xpert Xpress SARS-CoV-2/FLU/RSV plus assay is intended as an aid in the diagnosis of influenza from Nasopharyngeal swab specimens and should not be used as a sole basis for treatment. Nasal washings and aspirates are unacceptable for Xpert Xpress SARS-CoV-2/FLU/RSV testing.  Fact Sheet for  Patients: BloggerCourse.com  Fact Sheet for Healthcare Providers: SeriousBroker.it  This test is not yet approved or cleared by the [place] FDA and has been authorized for detection and/or diagnosis of SARS-CoV-2 by FDA under an Emergency Use Authorization (EUA). This EUA will remain in effect (meaning this test can be used) for the duration of the COVID-19 declaration under Section 564(b)(1) of the Act, 21 U.S.C. section 360bbb-3(b)(1), unless the authorization is terminated or revoked.     Resp Syncytial Virus by PCR NEGATIVE NEGATIVE    Comment: (NOTE) Fact Sheet for Patients: BloggerCourse.com  Fact Sheet for Healthcare Providers: SeriousBroker.it  This test is not yet approved or cleared by the [place] FDA and has been authorized for detection and/or diagnosis of SARS-CoV-2 by FDA under an Emergency Use Authorization (EUA). This EUA will remain in effect (meaning this test can be used) for the duration of the COVID-19 declaration under Section 564(b)(1) of the Act, 21 U.S.C. section 360bbb-3(b)(1), unless the authorization is terminated or revoked.  Performed at [facility], [address], [place], [place] [address]   I-Stat Lactic Acid, ED     Status: Abnormal   Collection Time: 12/27/22 10:41 AM  Result Value Ref Range   Lactic Acid, Venous 3.7 (HH) 0.5 - 1.9 mmol/L   Comment NOTIFIED PHYSICIAN   I-stat chem 8, ED     Status: Abnormal   Collection Time: 12/27/22 10:41 AM  Result Value Ref Range   Sodium 142 135 - 145 mmol/L   Potassium 4.6 3.5 - 5.1 mmol/L   Chloride 103 98 - 111 mmol/L   BUN 14 6 - 20 mg/dL   Creatinine, Ser 3.87 0.61 - 1.24 mg/dL   Glucose, Bld 564 (H) 70 - 99 mg/dL    Comment:  Glucose reference range applies only to samples taken after fasting for at least 8 hours.   Calcium, Ion 1.14 (L) 1.15 - 1.40 mmol/L    TCO2 28 22 - 32 mmol/L   Hemoglobin 16.0 13.0 - 17.0 g/dL   HCT 40.9 81.1 - 91.4 %   CT ABDOMEN PELVIS W CONTRAST  Result Date: 12/27/2022 CLINICAL DATA:  Acute generalized abdominal pain, nausea, vomiting. EXAM: CT ABDOMEN AND PELVIS WITH CONTRAST TECHNIQUE: Multidetector CT imaging of the abdomen and pelvis was performed using the standard protocol following bolus administration of intravenous contrast. RADIATION DOSE REDUCTION: This exam was performed according to the departmental dose-optimization program which includes automated exposure control, adjustment of the mA and/or kV according to patient size and/or use of iterative reconstruction technique. CONTRAST:  OMNIPAQUE IOHEXOL 300 MG/ML  SOLN COMPARISON:  May 14, 2021. FINDINGS: Lower chest: No acute abnormality. Hepatobiliary: Cholelithiasis. No biliary dilatation. Stable hepatic cysts are noted. Pancreas: Unremarkable. No pancreatic ductal dilatation or surrounding inflammatory changes. Spleen: Normal in size without focal abnormality. Adrenals/Urinary Tract: Adrenal glands appear normal. No hydronephrosis or renal obstruction is noted. Bilateral renal cysts are noted for which no further follow-up is required. No hydronephrosis or renal obstruction is noted. Urinary bladder is decompressed. Stomach/Bowel: Moderate gastric distention is noted. Evaluation of bowel loops is limited due to motion artifact. Moderate amount of stool seen in sigmoid colon and rectum. Mild colonic dilatation is noted which may represent ileus or potentially distal colonic obstruction. Mildly dilated small bowel loops are noted which may represent ileus. 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 No adenopathy is noted. Reproductive: Mild prostatic enlargement is noted. Other: No abdominal wall hernia or abnormality. No  abdominopelvic ascites. Musculoskeletal: No acute or significant osseous findings. IMPRESSION: Limited exam due to patient motion artifact. Moderate amount of stool seen in the sigmoid colon and rectum. Mild colonic dilatation is noted which may represent ileus or potentially distal colonic obstruction. Mildly dilated small bowel loops are noted as well as moderate gastric distention which may represent ileus. Mild prostatic enlargement. 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 Cholelithiasis. Electronically Signed   By: Lupita Raider M.D.   On: 12/27/2022 13:12   DG Chest Port 1 View  Result Date: 12/27/2022 CLINICAL DATA:  Abdominal pain, nausea, vomiting and diarrhea. EXAM: PORTABLE CHEST 1 VIEW COMPARISON:  02/16/2019. FINDINGS: Cardiac silhouette is normal in size. No mediastinal or hilar masses. Lung volumes are low. Lungs are clear. No convincing pleural effusion or pneumothorax. Skeletal structures are grossly intact. IMPRESSION: No active disease. Electronically Signed   By: Amie Portland M.D.   On: 12/27/2022 09:48    Pending Labs Unresulted Labs (From admission, onward)     Start     Ordered   12/28/22 0500  Comprehensive metabolic panel  Tomorrow morning,   R        12/27/22 1405   12/28/22 0500  CBC  Tomorrow morning,   R        12/27/22 1405   12/28/22 0500  Protime-INR  Tomorrow morning,   R        12/27/22 1405   12/28/22 0500  Magnesium  Tomorrow morning,   R        12/27/22 1405  12/27/22 1427  Lactic acid, plasma  (Lactic Acid)  Once,   R        12/27/22 1427   12/27/22 1404  HIV Antibody (routine testing w rflx)  (HIV Antibody (Routine testing w reflex) panel)  Once,   R        12/27/22 1405   12/27/22 0911  Blood Culture (routine x 2)  (Undifferentiated presentation (screening labs and basic nursing orders))  BLOOD CULTURE X 2,   STAT      12/27/22  0911   12/27/22 0911  Urinalysis, w/ Reflex to Culture (Infection Suspected) -Urine, Unspecified Source  (Undifferentiated presentation (screening labs and basic nursing orders))  ONCE - URGENT,   URGENT       Question:  Specimen Source  Answer:  Urine, Unspecified Source   12/27/22 0911            Vitals/Pain Today's Vitals   12/27/22 0909 12/27/22 1043 12/27/22 1115 12/27/22 1305  BP:  124/88 108/70   Pulse:  (!) 104 99   Resp:  (!) 34 (!) 24   Temp: 100.2 F (37.9 C)   99 F (37.2 C)  TempSrc: Rectal   Axillary  SpO2:  95% 94%     Isolation Precautions No active isolations  Medications Medications  dextrose 5 % and 0.45 % NaCl with KCl 20 mEq/L infusion (has no administration in time range)  acetaminophen (TYLENOL) suppository 650 mg (has no administration in time range)  methocarbamol (ROBAXIN) injection 500 mg (has no administration in time range)  ondansetron (ZOFRAN) tablet 4 mg (has no administration in time range)    Or  ondansetron (ZOFRAN) injection 4 mg (has no administration in time range)  hydrALAZINE (APRESOLINE) injection 10 mg (has no administration in time range)  metoprolol tartrate (LOPRESSOR) injection 2.5 mg (has no administration in time range)  sodium phosphate (FLEET) enema 1 enema (has no administration in time range)  acetaminophen (TYLENOL) tablet 650 mg (650 mg Oral Given 12/27/22 1014)  lactated ringers bolus 1,000 mL (1,000 mLs Intravenous New Bag/Given 12/27/22 1145)  diazepam (VALIUM) tablet 2 mg (2 mg Oral Given 12/27/22 1049)  iohexol (OMNIPAQUE) 300 MG/ML solution 100 mL (100 mLs Intravenous Contrast Given 12/27/22 1157)    Mobility non-ambulatory

## 2022-12-27 NOTE — Consult Note (Signed)
Reason for Consult:vomiting Referring Physician: Dr [name] is an 42 y.o. male.  HPI: The patient is a 42 year old white male with a history of cerebral palsy.  His caregiver noticed that his abdomen seemed distended and he had 1 episode of vomiting.  He was brought to the ER where a CT scan was performed.  He had distention of the small and large bowel and significant constipation noted.  He has had a history of bowel obstruction in the past.  With his cerebral palsy it is difficult for him to communicate effectively.  Past Medical History:  Diagnosis Date   Cerebral palsy (HCC)    Chronic constipation    Coagulopathy (HCC)    DVT (deep venous thrombosis) (HCC)    Dysrhythmia    Hypertension    Insomnia    Pneumonia    Recurrent UTI    Tachycardia     Past Surgical History:  Procedure Laterality Date   ELBOW SURGERY  09/12/2010   left   HERNIA REPAIR     INCISE AND DRAIN ABCESS  09/11/2010   LAPAROTOMY N/A 12/15/2017   Procedure: EXPLORATORY LAPAROTOMY;  Surgeon: Rodman Pickle, MD;  Location: [facility] OR;  Service: General;  Laterality: N/A;   SMALL INTESTINE SURGERY     TOOTH EXTRACTION  11/22/2011   Procedure: DENTAL RESTORATION/EXTRACTIONS;  Surgeon: Winfield Rast, MD;  Location: [facility] OR;  Service: Dentistry;  Laterality: N/A;  dental restoration and cleaning; NO EXTRACTIONS   TRANSURETHRAL RESECTION OF BLADDER TUMOR Bilateral 02/04/2022   Procedure: CYSTOSCOPY BILATERAL RETROGRADE PYELOGRAM, LEFT DIAGNOSTIC URETEROSCOPY;  Surgeon: Crista Elliot, MD;  Location: [facility] ORS;  Service: Urology;  Laterality: Bilateral;  1 HR FOR CASE    Family History  Family history unknown: Yes    Social History:  reports that he has never smoked. He has never used smokeless tobacco. He reports that he does not drink alcohol and does not use drugs.  Allergies:  Allergies  Allergen Reactions   Betadine [Povidone Iodine] Other (See Comments)    Unknown reaction per  caregiver   Ciprofloxacin Other (See Comments)    Unknown reaction per caregiver   Fentanyl Other (See Comments)    Previous reaction resulting in intubation, unclear details per family   Morphine Other (See Comments)    Unknown reaction per caregiver    Medications: I have reviewed the patient's current medications.  Results for orders placed or performed during the hospital encounter of 12/27/22 (from the past 48 hour(s))  Comprehensive metabolic panel     Status: Abnormal   Collection Time: 12/27/22  9:11 AM  Result Value Ref Range   Sodium 141 135 - 145 mmol/L   Potassium 4.4 3.5 - 5.1 mmol/L   Chloride 104 98 - 111 mmol/L   CO2 27 22 - 32 mmol/L   Glucose, Bld 105 (H) 70 - 99 mg/dL    Comment: Glucose reference range applies only to samples taken after fasting for at least 8 hours.   BUN 13 6 - 20 mg/dL   Creatinine, Ser 0.98 0.61 - 1.24 mg/dL   Calcium 8.9 8.9 - 11.9 mg/dL   Total Protein 7.1 6.5 - 8.1 g/dL   Albumin 3.9 3.5 - 5.0 g/dL   AST 21 15 - 41 U/L   ALT 15 0 - 44 U/L   Alkaline Phosphatase 62 38 - 126 U/L   Total Bilirubin 1.3 (H) <1.2 mg/dL   GFR, Estimated >14 >78 mL/min  Comment: (NOTE) Calculated using the CKD-EPI Creatinine Equation (2021)    Anion gap 10 5 - 15    Comment: Performed at [facility], [address], [place], [place] [address]  CBC with Differential     Status: Abnormal   Collection Time: 12/27/22  9:11 AM  Result Value Ref Range   WBC 9.7 4.0 - 10.5 K/uL   RBC 4.99 4.22 - 5.81 MIL/uL   Hemoglobin 15.3 13.0 - 17.0 g/dL   HCT 95.6 21.3 - 08.6 %   MCV 94.8 80.0 - 100.0 fL   MCH 30.7 26.0 - 34.0 pg   MCHC 32.3 30.0 - 36.0 g/dL   RDW 57.8 46.9 - 62.9 %   Platelets 212 150 - 400 K/uL   nRBC 0.0 0.0 - 0.2 %   Neutrophils Relative % 82 %   Neutro Abs 7.9 (H) 1.7 - 7.7 K/uL   Lymphocytes Relative 9 %   Lymphs Abs 0.9 0.7 - 4.0 K/uL   Monocytes Relative 7 %   Monocytes Absolute 0.7 0.1 - 1.0 K/uL   Eosinophils  Relative 2 %   Eosinophils Absolute 0.2 0.0 - 0.5 K/uL   Basophils Relative 0 %   Basophils Absolute 0.0 0.0 - 0.1 K/uL   Immature Granulocytes 0 %   Abs Immature Granulocytes 0.04 0.00 - 0.07 K/uL    Comment: Performed at [facility], [address] [place], [place] [address]  Protime-INR     Status: Abnormal   Collection Time: 12/27/22  9:11 AM  Result Value Ref Range   Prothrombin Time 19.1 (H) 11.4 - 15.2 seconds   INR 1.6 (H) 0.8 - 1.2    Comment: (NOTE) INR goal varies based on device and disease states. Performed at [facility], [address] [place], [place] [address]   APTT     Status: None   Collection Time: 12/27/22  9:11 AM  Result Value Ref Range   aPTT 28 24 - 36 seconds    Comment: Performed at [facility], [address] [place], [place] [address]  Lipase, blood     Status: None   Collection Time: 12/27/22  9:11 AM  Result Value Ref Range   Lipase 26 11 - 51 U/L    Comment: Performed at [facility], [address] [place], [place] [address]  Resp panel by RT-PCR (RSV, Flu A&B, Covid) Anterior Nasal Swab     Status: None   Collection Time: 12/27/22  9:39 AM   Specimen: Anterior Nasal Swab  Result Value Ref Range   SARS Coronavirus 2 by RT PCR NEGATIVE NEGATIVE    Comment: (NOTE) SARS-CoV-2 target nucleic acids are NOT DETECTED.  The SARS-CoV-2 RNA is generally detectable in upper respiratory specimens during the acute phase of infection. The lowest concentration of SARS-CoV-2 viral copies this assay can detect is 138 copies/mL. A negative result does not preclude SARS-Cov-2 infection and should not be used as the sole basis for treatment or other patient management decisions. A negative result may occur with  improper specimen collection/handling, submission of specimen other than nasopharyngeal swab, presence of viral mutation(s) within the areas targeted by this assay, and  inadequate number of viral copies(<138 copies/mL). A negative result must be combined with clinical observations, patient history, and epidemiological information. The expected result is Negative.  Fact Sheet for Patients:  BloggerCourse.com  Fact Sheet for Healthcare Providers:  SeriousBroker.it  This test is no t yet  approved or cleared by the [place] and  has been authorized for detection and/or diagnosis of SARS-CoV-2 by FDA under an Emergency Use Authorization (EUA). This EUA will remain  in effect (meaning this test can be used) for the duration of the COVID-19 declaration under Section 564(b)(1) of the Act, 21 U.S.C.section 360bbb-3(b)(1), unless the authorization is terminated  or revoked sooner.       Influenza A by PCR NEGATIVE NEGATIVE   Influenza B by PCR NEGATIVE NEGATIVE    Comment: (NOTE) The Xpert Xpress SARS-CoV-2/FLU/RSV plus assay is intended as an aid in the diagnosis of influenza from Nasopharyngeal swab specimens and should not be used as a sole basis for treatment. Nasal washings and aspirates are unacceptable for Xpert Xpress SARS-CoV-2/FLU/RSV testing.  Fact Sheet for Patients: BloggerCourse.com  Fact Sheet for Healthcare Providers: SeriousBroker.it  This test is not yet approved or cleared by the [place] FDA and has been authorized for detection and/or diagnosis of SARS-CoV-2 by FDA under an Emergency Use Authorization (EUA). This EUA will remain in effect (meaning this test can be used) for the duration of the COVID-19 declaration under Section 564(b)(1) of the Act, 21 U.S.C. section 360bbb-3(b)(1), unless the authorization is terminated or revoked.     Resp Syncytial Virus by PCR NEGATIVE NEGATIVE    Comment: (NOTE) Fact Sheet for Patients: BloggerCourse.com  Fact Sheet for Healthcare  Providers: SeriousBroker.it  This test is not yet approved or cleared by the [place] FDA and has been authorized for detection and/or diagnosis of SARS-CoV-2 by FDA under an Emergency Use Authorization (EUA). This EUA will remain in effect (meaning this test can be used) for the duration of the COVID-19 declaration under Section 564(b)(1) of the Act, 21 U.S.C. section 360bbb-3(b)(1), unless the authorization is terminated or revoked.  Performed at [facility], [address] [place], [place] [address]   I-Stat Lactic Acid, ED     Status: Abnormal   Collection Time: 12/27/22 10:41 AM  Result Value Ref Range   Lactic Acid, Venous 3.7 (HH) 0.5 - 1.9 mmol/L   Comment NOTIFIED PHYSICIAN   I-stat chem 8, ED     Status: Abnormal   Collection Time: 12/27/22 10:41 AM  Result Value Ref Range   Sodium 142 135 - 145 mmol/L   Potassium 4.6 3.5 - 5.1 mmol/L   Chloride 103 98 - 111 mmol/L   BUN 14 6 - 20 mg/dL   Creatinine, Ser 6.30 0.61 - 1.24 mg/dL   Glucose, Bld 160 (H) 70 - 99 mg/dL    Comment: Glucose reference range applies only to samples taken after fasting for at least 8 hours.   Calcium, Ion 1.14 (L) 1.15 - 1.40 mmol/L   TCO2 28 22 - 32 mmol/L   Hemoglobin 16.0 13.0 - 17.0 g/dL   HCT 10.9 32.3 - 55.7 %  Urinalysis, w/ Reflex to Culture (Infection Suspected) -Urine, Unspecified Source     Status: Abnormal   Collection Time: 12/27/22  2:27 PM  Result Value Ref Range   Specimen Source URINE, UNSPE    Color, Urine AMBER (A) YELLOW    Comment: BIOCHEMICALS MAY BE AFFECTED BY COLOR   APPearance HAZY (A) CLEAR   Specific Gravity, Urine RESULTS UNAVAILABLE DUE TO INTERFERING SUBSTANCE 1.005 - 1.030   pH 6.0 5.0 - 8.0   Glucose, UA NEGATIVE NEGATIVE mg/dL   Hgb urine dipstick SMALL (A) NEGATIVE   Bilirubin Urine NEGATIVE NEGATIVE   Ketones, ur 20 (A)  NEGATIVE mg/dL   Protein, ur 30 (A) NEGATIVE mg/dL   Nitrite POSITIVE (A) NEGATIVE    Leukocytes,Ua NEGATIVE NEGATIVE   RBC / HPF 21-50 0 - 5 RBC/hpf   WBC, UA 0-5 0 - 5 WBC/hpf    Comment:        Reflex urine culture not performed if WBC <=10, OR if Squamous epithelial cells >5. If Squamous epithelial cells >5 suggest recollection.    Bacteria, UA MANY (A) NONE SEEN   Squamous Epithelial / HPF 0-5 0 - 5 /HPF   Mucus PRESENT    Ca Oxalate Crys, UA PRESENT     Comment: Performed at [facility] [address], [place], [place] [address]    CT ABDOMEN PELVIS W CONTRAST  Result Date: 12/27/2022 CLINICAL DATA:  Acute generalized abdominal pain, nausea, vomiting. EXAM: CT ABDOMEN AND PELVIS WITH CONTRAST TECHNIQUE: Multidetector CT imaging of the abdomen and pelvis was performed using the standard protocol following bolus administration of intravenous contrast. RADIATION DOSE REDUCTION: This exam was performed according to the departmental dose-optimization program which includes automated exposure control, adjustment of the mA and/or kV according to patient size and/or use of iterative reconstruction technique. CONTRAST:  OMNIPAQUE IOHEXOL 300 MG/ML  SOLN COMPARISON:  May 14, 2021. FINDINGS: Lower chest: No acute abnormality. Hepatobiliary: Cholelithiasis. No biliary dilatation. Stable hepatic cysts are noted. Pancreas: Unremarkable. No pancreatic ductal dilatation or surrounding inflammatory changes. Spleen: Normal in size without focal abnormality. Adrenals/Urinary Tract: Adrenal glands appear normal. No hydronephrosis or renal obstruction is noted. Bilateral renal cysts are noted for which no further follow-up is required. No hydronephrosis or renal obstruction is noted. Urinary bladder is decompressed. Stomach/Bowel: Moderate gastric distention is noted. Evaluation of bowel loops is limited due to motion artifact. Moderate amount of stool seen in sigmoid colon and rectum. Mild colonic dilatation is noted which may represent ileus or potentially distal  colonic obstruction. Mildly dilated small bowel loops are noted which may represent ileus. 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 No adenopathy is noted. Reproductive: Mild prostatic enlargement is noted. Other: No abdominal wall hernia or abnormality. No abdominopelvic ascites. Musculoskeletal: No acute or significant osseous findings. IMPRESSION: Limited exam due to patient motion artifact. Moderate amount of stool seen in the sigmoid colon and rectum. Mild colonic dilatation is noted which may represent ileus or potentially distal colonic obstruction. Mildly dilated small bowel loops are noted as well as moderate gastric distention which may represent ileus. Mild prostatic enlargement. 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 Cholelithiasis. Electronically Signed   By: Lupita Raider M.D.   On: 12/27/2022 13:12   DG Chest Port 1 View  Result Date: 12/27/2022 CLINICAL DATA:  Abdominal pain, nausea, vomiting and diarrhea. EXAM: PORTABLE CHEST 1 VIEW COMPARISON:  02/16/2019. FINDINGS: Cardiac silhouette is normal in size. No mediastinal or hilar masses. Lung volumes are low. Lungs are clear. No convincing pleural effusion or pneumothorax. Skeletal structures are grossly intact. IMPRESSION: No active disease. Electronically Signed   By: Amie Portland M.D.   On: 12/27/2022 09:48    Review of Systems  Constitutional: Negative.   HENT: Negative.    Eyes: Negative.   Respiratory: Negative.    Cardiovascular: Negative.   Gastrointestinal:  Positive for  vomiting. Negative for abdominal pain.  Endocrine: Negative.   Genitourinary: Negative.   Musculoskeletal: Negative.   Skin: Negative.   Allergic/Immunologic: Negative.   Neurological:  Negative.   Hematological: Negative.   Psychiatric/Behavioral: Negative.     Blood pressure 119/68, pulse 99, temperature 99 F (37.2 C), temperature source Axillary, resp. rate 18, SpO2 98%. Physical Exam Vitals reviewed.  Constitutional:      General: He is not in acute distress.    Appearance: He is normal weight.  HENT:     Head: Normocephalic and atraumatic.     Right Ear: External ear normal.     Left Ear: External ear normal.     Nose: Nose normal.     Mouth/Throat:     Mouth: Mucous membranes are dry.     Pharynx: Oropharynx is clear.  Eyes:     Extraocular Movements: Extraocular movements intact.     Conjunctiva/sclera: Conjunctivae normal.     Pupils: Pupils are equal, round, and reactive to light.  Cardiovascular:     Rate and Rhythm: Normal rate and regular rhythm.     Pulses: Normal pulses.     Heart sounds: Normal heart sounds.  Pulmonary:     Effort: Pulmonary effort is normal. No respiratory distress.     Breath sounds: Normal breath sounds.  Abdominal:     Palpations: Abdomen is soft.     Tenderness: There is no abdominal tenderness.     Comments: The abdomen is soft and nontender.  He has mild distention  Musculoskeletal:     Cervical back: Rigidity present.     Comments: Cerebral palsy  Skin:    General: Skin is warm and dry.     Coloration: Skin is not jaundiced.  Neurological:     Mental Status: He is alert.     Comments: Spasticity from cerebral palsy  Psychiatric:     Comments: Difficult to communicate     Assessment/Plan: The patient may have a partial bowel obstruction versus an ileus possibly secondary to constipation.  Given how soft and nontender his abdomen is I would favor the latter.  At this point we could start the small bowel protocol and simply let him drink the contrast and follow him with serial x-rays.  We will follow him with you.  [name] 12/27/2022, 5:43 PM

## 2022-12-27 NOTE — ED Provider Notes (Signed)
EMERGENCY DEPARTMENT AT [facility] Provider Note   CSN: [id number] Arrival date & time: 12/27/22  0845     History  Chief Complaint  Patient presents with   Abdominal Pain   Nausea   Emesis   Diarrhea    Jerri Vanfossen is a 42 y.o. male.  HPI 42 year old male with a history of cerebral palsy, coagulopathy and DVT, prior bowel obstructions, and other comorbidities presents with concern for bowel obstruction.  His caregiver called 911 as he was having vomiting and diarrhea this morning.  This is similar to prior episodes of bowel obstruction.  Patient tells me he is having lower abdominal pain.  He was found to have a temperature of 100.2 here.  Caregiver arrived after initial presentation and noted that the patient vomited this morning and his abdomen seemed distended like he is had with prior bowel obstructions.  Previous to this he was doing fine.  He did have a small, nondiarrheal bowel movement this morning.  No blood.  No reports of fever or previous illness though caregiver states he has been congested, though it is unclear how long it has been going on.  Home Medications Prior to Admission medications   Medication Sig Start Date End Date Taking? Authorizing Provider  acetaminophen (TYLENOL) 325 MG tablet Take 650 mg by mouth every 6 (six) hours as needed for mild pain or moderate pain. For pain   Yes [provider]  baclofen (LIORESAL) 20 MG tablet Take 1 tablet (20 mg total) by mouth 4 (four) times daily. 05/27/19  Yes Levert Feinstein, MD  calcium carbonate (TUMS - DOSED IN MG ELEMENTAL CALCIUM) 500 MG chewable tablet Chew 1 tablet by mouth 3 (three) times daily.   Yes [provider]  cetirizine (ZYRTEC) 10 MG tablet Take 10 mg by mouth daily.   Yes [provider]  cholecalciferol (VITAMIN D) 1000 UNITS tablet Take 1,000 Units by mouth 2 (two) times daily.   Yes [provider]  cyclobenzaprine (FLEXERIL) 10 MG tablet  1 at 8am and 12pm and 2 tabs at 8pm. Patient taking differently: Take 10-20 mg by mouth See admin instructions. 1 at 8am and 12pm and 2 tabs at 8pm. 05/27/19  Yes Levert Feinstein, MD  dextromethorphan (DELSYM) 30 MG/5ML liquid Take 30 mg by mouth 2 (two) times daily as needed for cough.   Yes [provider]  diazepam (VALIUM) 2 MG tablet Take 1 tablet (2 mg total) by mouth 2 (two) times daily. Must last 30 days. 05/27/19  Yes Levert Feinstein, MD  diphenhydrAMINE (BENADRYL) 25 MG tablet Take 25 mg by mouth every 6 (six) hours as needed for allergies.   Yes [provider]  furosemide (LASIX) 20 MG tablet Take 20 mg by mouth daily as needed for fluid (leg edema).   Yes [provider]  glycerin adult 2 g suppository Place 1 suppository rectally as needed for constipation.   Yes [provider]  hydrochlorothiazide (HYDRODIURIL) 25 MG tablet Take 12.5 mg by mouth daily.    Yes [provider]  metoprolol succinate (TOPROL-XL) 50 MG 24 hr tablet Take 1 tablet (50 mg total) by mouth daily. 02/26/19  Yes Sherrie George, PA-C  Multiple Vitamin (MULITIVITAMIN WITH MINERALS) TABS Take 1 tablet by mouth daily.   Yes [provider]  nitrofurantoin (MACRODANTIN) 50 MG capsule Take 50 mg by mouth daily. continuous   Yes [provider]  nystatin (MYCOSTATIN/NYSTOP) powder Apply 1 Application topically at bedtime  as needed (irritation). 06/21/19  Yes [provider]  ondansetron (ZOFRAN ODT) 4 MG disintegrating tablet Take 1 tablet (4 mg total) by mouth every 8 (eight) hours as needed for nausea or vomiting. 05/10/19  Yes Petrucelli, Samantha R, PA-C  polyethylene glycol powder (GLYCOLAX/MIRALAX) 17 GM/SCOOP powder Take 17 g by mouth daily as needed for mild constipation, moderate constipation or severe constipation. 01/29/18  Yes [provider]  psyllium (HYDROCIL/METAMUCIL) 95 % PACK Give him one dose daily as directed on the package.  This is  to help with his constipation.  You can buy this over the counter at any drug store. Patient taking differently: Take 1 packet by mouth daily as needed for mild constipation or moderate constipation. 02/25/19  Yes Sherrie George, PA-C  senna-docusate (SENOKOT-S) 8.6-50 MG tablet Take 3 tablets by mouth at bedtime.   Yes [provider]  Sodium Fluoride (SODIUM FLUORIDE 5000 PPM) 1.1 % PSTE Take 1 Application by mouth at bedtime. 10/29/19  Yes [provider]  Stannous Fluoride (GEL-KAM) 0.4 % GEL Place 1 application onto teeth daily.   Yes [provider]  tiZANidine (ZANAFLEX) 2 MG tablet TAKE 1 TABLET BY MOUTH TWICE A DAY;TAKE 2 TABLETS (4MG ) BY MOUTH AT 12PM (DAY PROGRAM) Patient taking differently: Take 2-4 mg by mouth See admin instructions. TAKE 1 TABLET BY MOUTH TWICE A DAY;TAKE 2 TABLETS (4MG ) BY MOUTH AT 12PM (DAY PROGRAM) 05/03/20  Yes Levert Feinstein, MD  traMADol (ULTRAM) 50 MG tablet Take 100 mg by mouth every 6 (six) hours as needed for moderate pain.   Yes [provider]  warfarin (COUMADIN) 3 MG tablet Take 3 mg by mouth daily at 6 PM.    Yes [provider]      Allergies    Betadine [povidone iodine], Ciprofloxacin, Fentanyl, and Morphine    Review of Systems   Review of Systems  Gastrointestinal:  Positive for abdominal pain and vomiting.    Physical Exam Updated Vital Signs BP 108/70 (BP Location: Right Arm)   Pulse 99   Temp 99 F (37.2 C) (Axillary)   Resp (!) 24   SpO2 94%  Physical Exam Vitals and nursing note reviewed.  Constitutional:      Appearance: He is well-developed. He is not ill-appearing or diaphoretic.  HENT:     Head: Normocephalic and atraumatic.  Cardiovascular:     Rate and Rhythm: Regular rhythm. Tachycardia present.     Heart sounds: Normal heart sounds.     Comments: HR low 100s Pulmonary:     Effort: Pulmonary effort is normal.     Breath sounds: Normal breath sounds.  Abdominal:      Palpations: Abdomen is soft.     Tenderness: There is abdominal tenderness in the right lower quadrant, suprapubic area and left lower quadrant.  Skin:    General: Skin is warm and dry.  Neurological:     Mental Status: He is alert.     Comments: Chronic contractures     ED Results / Procedures / Treatments   Labs (all labs ordered are listed, but only abnormal results are displayed) Labs Reviewed  COMPREHENSIVE METABOLIC PANEL - Abnormal; Notable for the following components:      Result Value   Glucose, Bld 105 (*)    Total Bilirubin 1.3 (*)    All other components within normal limits  CBC WITH DIFFERENTIAL/PLATELET - Abnormal; Notable for the following components:   Neutro Abs 7.9 (*)    All  other components within normal limits  PROTIME-INR - Abnormal; Notable for the following components:   Prothrombin Time 19.1 (*)    INR 1.6 (*)    All other components within normal limits  URINALYSIS, W/ REFLEX TO CULTURE (INFECTION SUSPECTED) - Abnormal; Notable for the following components:   Color, Urine AMBER (*)    APPearance HAZY (*)    Hgb urine dipstick SMALL (*)    Ketones, ur 20 (*)    Protein, ur 30 (*)    Nitrite POSITIVE (*)    Bacteria, UA MANY (*)    All other components within normal limits  I-STAT CG4 LACTIC ACID, ED - Abnormal; Notable for the following components:   Lactic Acid, Venous 3.7 (*)    All other components within normal limits  I-STAT CHEM 8, ED - Abnormal; Notable for the following components:   Glucose, Bld 108 (*)    Calcium, Ion 1.14 (*)    All other components within normal limits  RESP PANEL BY RT-PCR (RSV, FLU A&B, COVID)  RVPGX2  CULTURE, BLOOD (ROUTINE X 2)  CULTURE, BLOOD (ROUTINE X 2)  APTT  LIPASE, BLOOD  HIV ANTIBODY (ROUTINE TESTING W REFLEX)  LACTIC ACID, PLASMA  I-STAT CG4 LACTIC ACID, ED    EKG EKG Interpretation Date/Time:  Friday December 27 2022 08:58:54 EST Ventricular Rate:  93 PR Interval:  155 QRS  Duration:  115 QT Interval:  346 QTC Calculation: 431 R Axis:   -79  Text Interpretation: Sinus rhythm Probable left atrial enlargement Incomplete right bundle branch block Inferior infarct, 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 EXAM: CT ABDOMEN AND PELVIS WITH CONTRAST TECHNIQUE: Multidetector CT imaging of the abdomen and pelvis was performed using the standard protocol following bolus administration of intravenous contrast. RADIATION DOSE REDUCTION: This exam was performed according to the departmental dose-optimization program which includes automated exposure control, adjustment of the mA and/or kV according to patient size and/or use of iterative reconstruction technique. CONTRAST:  OMNIPAQUE IOHEXOL 300 MG/ML  SOLN COMPARISON:  May 14, 2021. FINDINGS: Lower chest: No acute abnormality. Hepatobiliary: Cholelithiasis. No biliary dilatation. Stable hepatic cysts are noted. Pancreas: Unremarkable. No pancreatic ductal dilatation or surrounding inflammatory changes. Spleen: Normal in size without focal abnormality. Adrenals/Urinary Tract: Adrenal glands appear normal. No hydronephrosis or renal obstruction is noted. Bilateral renal cysts are noted for which no further follow-up is required. No hydronephrosis or renal obstruction is noted. Urinary bladder is decompressed. Stomach/Bowel: Moderate gastric distention is noted. Evaluation of bowel loops is limited due to motion artifact. Moderate amount of stool seen in sigmoid colon and rectum. Mild colonic dilatation is noted which may represent ileus or potentially distal colonic obstruction. Mildly dilated small bowel loops are noted which may represent ileus. 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 No adenopathy is noted. Reproductive: Mild prostatic enlargement is noted. Other: No abdominal wall hernia or abnormality. No abdominopelvic ascites. Musculoskeletal: No acute or significant osseous findings. IMPRESSION: Limited exam due to patient motion artifact. Moderate amount of stool seen in the sigmoid colon and rectum. Mild colonic dilatation is noted which may represent ileus or potentially distal colonic obstruction. Mildly dilated small bowel loops are noted as well as moderate  gastric distention which may represent ileus. Mild prostatic enlargement. 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 Cholelithiasis. Electronically Signed   By: Lupita Raider M.D.   On: 12/27/2022 13:12   DG Chest Port 1 View  Result Date: 12/27/2022 CLINICAL DATA:  Abdominal pain, nausea, vomiting and diarrhea. EXAM: PORTABLE CHEST 1 VIEW COMPARISON:  02/16/2019. FINDINGS: Cardiac silhouette is normal in size. No mediastinal or hilar masses. Lung volumes are low. Lungs are clear. No convincing pleural effusion or pneumothorax. Skeletal structures are grossly intact. IMPRESSION: No active disease. Electronically Signed   By: Amie Portland M.D.   On: 12/27/2022 09:48    Procedures Procedures    Medications Ordered in ED Medications  dextrose 5 % and 0.45 % NaCl with KCl 20 mEq/L infusion (has no administration in time range)  acetaminophen (TYLENOL) suppository 650 mg (has no administration in time range)  methocarbamol (ROBAXIN) injection 500 mg (has no administration in time range)  ondansetron (ZOFRAN) tablet 4 mg (has no administration in time range)    Or  ondansetron (ZOFRAN) injection 4 mg (has no administration in time range)  hydrALAZINE (APRESOLINE) injection 10 mg (has no administration in  time range)  metoprolol tartrate (LOPRESSOR) injection 2.5 mg (has no administration in time range)  sodium phosphate (FLEET) enema 1 enema (has no administration in time range)  acetaminophen (TYLENOL) tablet 650 mg (650 mg Oral Given 12/27/22 1014)  lactated ringers bolus 1,000 mL (1,000 mLs Intravenous New Bag/Given 12/27/22 1145)  diazepam (VALIUM) tablet 2 mg (2 mg Oral Given 12/27/22 1049)  iohexol (OMNIPAQUE) 300 MG/ML solution 100 mL (100 mLs Intravenous Contrast Given 12/27/22 1157)    ED Course/ Medical Decision Making/ A&P                                 Medical Decision Making Amount and/or Complexity of Data Reviewed Labs: ordered.    Details: Normal WBC.  Lactate elevated but probably from vomiting and dehydration. Radiology: ordered and independent interpretation performed.    Details: Small bowel obstruction. ECG/medicine tests: ordered and independent interpretation performed.    Details: Sinus rhythm.  Risk OTC drugs. Prescription drug management. Decision regarding hospitalization.   Patient presents with concern for bowel obstruction.  Seems to have a recurrent bowel obstruction versus ileus on CT.  He is not currently vomiting but he has been vomiting and I think with his history do NG tube, supportive care, and admit as has been done multiple times.  Discussed with Dr. Carolynne Edouard, general surgery will consult.  Dr. Tyson Babinski will admit.  Patient had a low-grade temperature but not quite a fever here.  His lactate is elevated but with no other clear source of infection (his urine is expected to be contaminated given chronic catheter use), and a better explanation for his vomiting such as the bowel obstruction, I do not think he has sepsis.  He will get fluids but will hold off on antibiotics for now.        Final Clinical Impression(s) / ED Diagnoses Final diagnoses:  Small bowel obstruction [facility])    Rx / DC Orders ED Discharge Orders     None          Pricilla Loveless, MD 12/27/22 1524

## 2022-12-27 NOTE — ED Triage Notes (Signed)
Pt BIB [facility] from home, c/o abdominal pain, nausea/vomit, diarrhea since this morning. History of cerebral palsy and bowel obstruction.   SBP 110 P 113 RR 20  SpO2 98%

## 2022-12-27 NOTE — H&P (Signed)
[facility] History and Physical  [name] WJX:[id number] DOB: Jun 22, 1980 DOA: 12/27/2022  Referring physician: ED  PCP: Fleet Contras, MD   Patient is coming from:  Home   Chief Complaint: Abdominal distention vomiting  HPI:   Patient is a 42 years old male with past medical history of cerebral palsy, constipation, hypertension, atrial fibrillation, DVT on Coumadin, prior history hernia repair, laparotomy and history of bowel obstruction presented to hospital with concerns for bowel obstruction.  The caregiver noted that the patient's abdomen was distended and did have 1 episode of vomiting.  He did have a small bowel movement this morning but caregiver reported that he had similar episodes of bowel obstruction in the past.  Patient reported some lower abdominal pain and had fever in the ED.  Mention of some congestion at home.  No mention of fever or chills.  In the ED, patient was tachycardic.  Had abdominal tenderness especially in the lower quadrants.  Labs were notable for elevated lactate at 3.7.  WBC was 9.7.  Lipase 26.  BMP within normal range except for mildly elevated total bilirubin at 1.3.  COVID influenza and RSV was negative.  Blood cultures were sent from the ED.  Chest x-ray showed no active disease.  Scan of the abdomen and pelvis with contrast showed moderate amount of stool in the sigmoid colon and rectum with colonic dilatation possibility of ileus versus distal colonic obstruction.  Multiple dilated small bowel loops were also noted.  Surgery Dr. Carolynne Edouard was notified from the ED who recommended conservative treatment at this time and will follow the patient while in the hospital.   Assessment and plan Principal Problem:   Small bowel obstruction [facility]) Active Problems:   Infantile cerebral palsy (HCC)   Atrial fibrillation (HCC)   Constipation   Hypertension   Lactic acidosis    Abdominal distention, nausea, vomiting abdominal pain. Lactic  acidosis.  History of bowel obstruction in the past with exploratory laparotomy 2019 with lysis of adhesions.  General surgery has been notified with recommendations for conservative treatment.  Continue IV fluids n.p.o. antiemetics.  Serial abdominal x-rays.  Follow-up general surgery recommendations.  Might benefit from NG tube if further vomiting but might not comply due to central pulse.  Will try enema x 1 . Lactic acidosis likely secondary to volume depletion, bowel obstruction.  Trend lactate.  Continue hydration.  Patient did have Tmax of 100.2 F.  History of cerebral palsy/ spasticity.  Currently being taken care of by caregiver.  On baclofen Flexeril, tizanidine at home.  Will hold oral for now.  Will continue with IV cephalexin for now  Sinus tachycardia.  Continue with IV hydration.  Monitor closely.  Hypertension.  Patient is on hydrochlorothiazide, metoprolol at home.  Will put the patient on as needed hydralazine for now.  History of paroxysmal atrial fibrillation/DVT.  Was on metoprolol and Coumadin.  Continue heparin drip.  INR of 1.6 at this time.  Will put on IV metoprolol as well   DVT Prophylaxis: Heparin gtt   Review of Systems:  Limited.  All systems were reviewed and were negative unless otherwise mentioned in the HPI   Past Medical History:  Diagnosis Date   Cerebral palsy (HCC)    Chronic constipation    Coagulopathy (HCC)    DVT (deep venous thrombosis) (HCC)    Dysrhythmia    Hypertension    Insomnia    Pneumonia    Recurrent UTI    Tachycardia  Past Surgical History:  Procedure Laterality Date   ELBOW SURGERY  09/12/2010   left   HERNIA REPAIR     INCISE AND DRAIN ABCESS  09/11/2010   LAPAROTOMY N/A 12/15/2017   Procedure: EXPLORATORY LAPAROTOMY;  Surgeon: Sheliah Hatch De Blanch, MD;  Location: [facility] OR;  Service: General;  Laterality: N/A;   SMALL INTESTINE SURGERY     TOOTH EXTRACTION  11/22/2011   Procedure: DENTAL RESTORATION/EXTRACTIONS;   Surgeon: Winfield Rast, MD;  Location: [facility] OR;  Service: Dentistry;  Laterality: N/A;  dental restoration and cleaning; NO EXTRACTIONS   TRANSURETHRAL RESECTION OF BLADDER TUMOR Bilateral 02/04/2022   Procedure: CYSTOSCOPY BILATERAL RETROGRADE PYELOGRAM, LEFT DIAGNOSTIC URETEROSCOPY;  Surgeon: Crista Elliot, MD;  Location: [facility] ORS;  Service: Urology;  Laterality: Bilateral;  1 HR FOR CASE    Social History:  reports that he has never smoked. He has never used smokeless tobacco. He reports that he does not drink alcohol and does not use drugs.  Allergies  Allergen Reactions   Betadine [Povidone Iodine] Other (See Comments)    Unknown reaction per caregiver   Ciprofloxacin Other (See Comments)    Unknown reaction per caregiver   Fentanyl Other (See Comments)    Previous reaction resulting in intubation, unclear details per family   Morphine Other (See Comments)    Unknown reaction per caregiver    Family History  Family history unknown: Yes     Prior to Admission medications   Medication Sig Start Date End Date Taking? Authorizing Provider  acetaminophen (TYLENOL) 325 MG tablet Take 650 mg by mouth every 6 (six) hours as needed for mild pain or moderate pain. For pain    [provider]  baclofen (LIORESAL) 20 MG tablet Take 1 tablet (20 mg total) by mouth 4 (four) times daily. 05/27/19   Levert Feinstein, MD  calcium carbonate (TUMS - DOSED IN MG ELEMENTAL CALCIUM) 500 MG chewable tablet Chew 1 tablet by mouth 3 (three) times daily.    [provider]  cetirizine (ZYRTEC) 10 MG tablet Take 10 mg by mouth daily.    [provider]  cholecalciferol (VITAMIN D) 1000 UNITS tablet Take 1,000 Units by mouth 2 (two) times daily.    [provider]  cyclobenzaprine (FLEXERIL) 10 MG tablet 1 at 8am and 12pm and 2 tabs at 8pm. Patient taking differently: Take 10-20 mg by mouth See admin instructions. 1 at 8am and 12pm and 2 tabs at 8pm. 05/27/19   Levert Feinstein, MD   dextromethorphan (DELSYM) 30 MG/5ML liquid Take 30 mg by mouth 2 (two) times daily as needed for cough.    [provider]  diazepam (VALIUM) 2 MG tablet Take 1 tablet (2 mg total) by mouth 2 (two) times daily. Must last 30 days. 05/27/19   Levert Feinstein, MD  diphenhydrAMINE (BENADRYL) 25 MG tablet Take 25 mg by mouth every 6 (six) hours as needed for allergies.    [provider]  furosemide (LASIX) 20 MG tablet Take 20 mg by mouth daily as needed for fluid (leg edema).    [provider]  glycerin adult 2 g suppository Place 1 suppository rectally as needed for constipation.    [provider]  hydrochlorothiazide (HYDRODIURIL) 25 MG tablet Take 12.5 mg by mouth daily.     [provider]  metoprolol succinate (TOPROL-XL) 50 MG 24 hr tablet Take 1 tablet (50 mg total) by mouth daily. 02/26/19   Sherrie George, PA-C  Multiple Vitamin [facility] WITH  MINERALS) TABS Take 1 tablet by mouth daily.    [provider]  nitrofurantoin (MACRODANTIN) 50 MG capsule Take 50 mg by mouth daily. continuous    [provider]  ondansetron (ZOFRAN ODT) 4 MG disintegrating tablet Take 1 tablet (4 mg total) by mouth every 8 (eight) hours as needed for nausea or vomiting. 05/10/19   Petrucelli, Samantha R, PA-C  psyllium (HYDROCIL/METAMUCIL) 95 % PACK Give him one dose daily as directed on the package.  This is to help with his constipation.  You can buy this over the counter at any drug store. Patient taking differently: Take 1 packet by mouth daily as needed for mild constipation or moderate constipation. 02/25/19   Sherrie George, PA-C  senna-docusate (SENOKOT-S) 8.6-50 MG tablet Take 3 tablets by mouth at bedtime.    [provider]  Stannous Fluoride (GEL-KAM) 0.4 % GEL Place 1 application onto teeth daily.    [provider]  tiZANidine (ZANAFLEX) 2 MG tablet TAKE 1 TABLET BY MOUTH TWICE A DAY;TAKE 2 TABLETS (4MG ) BY MOUTH AT  12PM (DAY PROGRAM) Patient taking differently: Take 2-4 mg by mouth See admin instructions. TAKE 1 TABLET BY MOUTH TWICE A DAY;TAKE 2 TABLETS (4MG ) BY MOUTH AT 12PM (DAY PROGRAM) 05/03/20   Levert Feinstein, MD  traMADol (ULTRAM) 50 MG tablet Take 100 mg by mouth every 6 (six) hours as needed for moderate pain.    [provider]  warfarin (COUMADIN) 3 MG tablet Take 3 mg by mouth daily at 6 PM.     [provider]    Physical Exam: Vitals:   12/27/22 0909 12/27/22 1043 12/27/22 1115 12/27/22 1305  BP:  124/88 108/70   Pulse:  (!) 104 99   Resp:  (!) 34 (!) 24   Temp: 100.2 F (37.9 C)   99 F (37.2 C)  TempSrc: Rectal   Axillary  SpO2:  95% 94%    Wt Readings from Last 3 Encounters:  02/04/22 77 kg  01/25/22 77.1 kg  05/17/21 87 kg   There is no height or weight on file to calculate BMI.  General:  Average built, not in obvious distress HENT: Normocephalic, No scleral pallor or icterus noted. Oral mucosa is moist.  Chest:  Clear breath sounds.  . No crackles or wheezes.  CVS: S1 &S2 heard. No murmur.  Regular rate and rhythm. Abdomen: Soft, nontender, nondistended.  Bowel sounds are heard. No abdominal mass palpated Extremities: No cyanosis, clubbing or edema.  Peripheral pulses are palpable. Psych: Alert, awake and oriented, normal mood CNS:  No cranial nerve deficits.  Power equal in all extremities.   Skin: Warm and dry.  No rashes noted.  Labs on Admission:   CBC: Recent Labs  Lab 12/27/22 0911 12/27/22 1041  WBC 9.7  --   NEUTROABS 7.9*  --   HGB 15.3 16.0  HCT 47.3 47.0  MCV 94.8  --   PLT 212  --     Basic Metabolic Panel: Recent Labs  Lab 12/27/22 0911 12/27/22 1041  NA 141 142  K 4.4 4.6  CL 104 103  CO2 27  --   GLUCOSE 105* 108*  BUN 13 14  CREATININE 0.91 1.00  CALCIUM 8.9  --     Liver Function Tests: Recent Labs  Lab 12/27/22 0911  AST 21  ALT 15  ALKPHOS 62  BILITOT 1.3*  PROT 7.1  ALBUMIN 3.9   Recent Labs  Lab  12/27/22 0911  LIPASE 26  No results for input(s): "AMMONIA" in the last 168 hours.  Cardiac Enzymes: No results for input(s): "CKTOTAL", "CKMB", "CKMBINDEX", "TROPONINI" in the last 168 hours.  BNP (last 3 results) No results for input(s): "BNP" in the last 8760 hours.  ProBNP (last 3 results) No results for input(s): "PROBNP" in the last 8760 hours.  CBG: No results for input(s): "GLUCAP" in the last 168 hours.  Lipase     Component Value Date/Time   LIPASE 26 12/27/2022 0911     Urinalysis    Component Value Date/Time   COLORURINE YELLOW 05/14/2021 0811   APPEARANCEUR HAZY (A) 05/14/2021 0811   LABSPEC 1.035 (H) 05/14/2021 0811   PHURINE 8.0 05/14/2021 0811   GLUCOSEU NEGATIVE 05/14/2021 0811   HGBUR NEGATIVE 05/14/2021 0811   BILIRUBINUR NEGATIVE 05/14/2021 0811   BILIRUBINUR negative 05/11/2015 1041   KETONESUR 20 (A) 05/14/2021 0811   PROTEINUR 30 (A) 05/14/2021 0811   UROBILINOGEN 1.0 05/11/2015 1041   UROBILINOGEN 0.2 06/03/2011 1809   NITRITE POSITIVE (A) 05/14/2021 0811   LEUKOCYTESUR NEGATIVE 05/14/2021 0811     Drugs of Abuse  No results found for: "LABOPIA", "COCAINSCRNUR", "LABBENZ", "AMPHETMU", "THCU", "LABBARB"    Radiological Exams on Admission: CT ABDOMEN PELVIS W CONTRAST  Result Date: 12/27/2022 CLINICAL DATA:  Acute generalized abdominal pain, nausea, vomiting. EXAM: CT ABDOMEN AND PELVIS WITH CONTRAST TECHNIQUE: Multidetector CT imaging of the abdomen and pelvis was performed using the standard protocol following bolus administration of intravenous contrast. RADIATION DOSE REDUCTION: This exam was performed according to the departmental dose-optimization program which includes automated exposure control, adjustment of the mA and/or kV according to patient size and/or use of iterative reconstruction technique. CONTRAST:  OMNIPAQUE IOHEXOL 300 MG/ML  SOLN COMPARISON:  May 14, 2021. FINDINGS: Lower chest: No acute abnormality.  Hepatobiliary: Cholelithiasis. No biliary dilatation. Stable hepatic cysts are noted. Pancreas: Unremarkable. No pancreatic ductal dilatation or surrounding inflammatory changes. Spleen: Normal in size without focal abnormality. Adrenals/Urinary Tract: Adrenal glands appear normal. No hydronephrosis or renal obstruction is noted. Bilateral renal cysts are noted for which no further follow-up is required. No hydronephrosis or renal obstruction is noted. Urinary bladder is decompressed. Stomach/Bowel: Moderate gastric distention is noted. Evaluation of bowel loops is limited due to motion artifact. Moderate amount of stool seen in sigmoid colon and rectum. Mild colonic dilatation is noted which may represent ileus or potentially distal colonic obstruction. Mildly dilated small bowel loops are noted which may represent ileus. 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 No adenopathy is noted. Reproductive: Mild prostatic enlargement is noted. Other: No abdominal wall hernia or abnormality. No abdominopelvic ascites. Musculoskeletal: No acute or significant osseous findings. IMPRESSION: Limited exam due to patient motion artifact. Moderate amount of stool seen in the sigmoid colon and rectum. Mild colonic dilatation is noted which may represent ileus or potentially distal colonic obstruction. Mildly dilated small bowel loops are noted as well as moderate gastric distention which may represent ileus. Mild prostatic enlargement. 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 Cholelithiasis. Electronically Signed   By: Lupita Raider M.D.   On: 12/27/2022 13:12   DG Chest Port 1 View  Result Date: 12/27/2022 CLINICAL DATA:  Abdominal pain, nausea, vomiting and  diarrhea. EXAM: PORTABLE CHEST 1 VIEW COMPARISON:  02/16/2019. FINDINGS: Cardiac silhouette is normal in size. No mediastinal or hilar masses. Lung volumes are low. Lungs are clear. No convincing pleural effusion or pneumothorax. Skeletal structures are grossly intact. IMPRESSION: No active disease. Electronically Signed   By: Amie Portland M.D.   On: 12/27/2022 09:48    EKG: Personally reviewed by me which shows normal sinus rhythm.   Consultant: Neurosurgery Dr. Ephriam Jenkins  Code Status: Full code  Microbiology blood culture sent from the ED  Antibiotics: None  Family Communication:  Patients' condition and plan of care including tests being ordered have been discussed with the patient and the patient's caregiver Apolinar Junes who indicate understanding and agree with the plan.   Status is: Inpatient   Severity of Illness: The appropriate patient status for this patient is INPATIENT. Inpatient status is judged to be reasonable and necessary in order to provide the required intensity of service to ensure the patient's safety. The patient's presenting symptoms, physical exam findings, and initial radiographic and laboratory data in the context of their chronic comorbidities is felt to place them at high risk for further clinical deterioration. Furthermore, it is not anticipated that the patient will be medically stable for discharge from the hospital within 2 midnights of admission.  I certify that at the point of admission it is my clinical judgment that the patient will require inpatient hospital care spanning beyond 2 midnights from the point of admission due to high intensity of service, high risk for further deterioration and high frequency of surveillance required.*  Signed, Joycelyn Das, MD [facility] 12/27/2022

## 2022-12-27 NOTE — Progress Notes (Signed)
PHARMACY - ANTICOAGULATION CONSULT NOTE  Pharmacy Consult for Heparin Indication: PTA warfarin on hold, Hx Afib and DVT  Allergies  Allergen Reactions   Betadine [Povidone Iodine] Other (See Comments)    Unknown reaction per caregiver   Ciprofloxacin Other (See Comments)    Unknown reaction per caregiver   Fentanyl Other (See Comments)    Previous reaction resulting in intubation, unclear details per family   Morphine Other (See Comments)    Unknown reaction per caregiver    Patient Measurements:   Last documented ht 65 inch and wt 77 kg (02/04/22) Heparin Dosing Weight:   Vital Signs: Temp: 99 F (37.2 C) (11/29 1305) Temp Source: Axillary (11/29 1305) BP: 119/68 (11/29 1553) Pulse Rate: 99 (11/29 1553)  Labs: Recent Labs    12/27/22 0911 12/27/22 1041  HGB 15.3 16.0  HCT 47.3 47.0  PLT 212  --   APTT 28  --   LABPROT 19.1*  --   INR 1.6*  --   CREATININE 0.91 1.00    CrCl cannot be calculated (Unknown ideal weight.).   Medical History: Past Medical History:  Diagnosis Date   Cerebral palsy (HCC)    Chronic constipation    Coagulopathy (HCC)    DVT (deep venous thrombosis) (HCC)    Dysrhythmia    Hypertension    Insomnia    Pneumonia    Recurrent UTI    Tachycardia     Prior to Admission Medications:   warfarin (COUMADIN) 3 MG tablet Take 3 mg by mouth daily at 6 PM.    12/26/2022   Infusions:   dextrose 5 % and 0.45 % NaCl with KCl 20 mEq/L 100 mL/hr at 12/27/22 1727    Assessment: 42 yoM presents to ED on 11/29 with distended abdomen, vomiting, possible partial bowel obstruction vs ileus d/t constipation.  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  Warfarin was held at admission d/t NPO, and pharmacy is consulted to dose Heparin IV as INR 1.6 is subtherapeutic.  Follow up procedural plans.    CBC: Hgb 15.3, Plt 212 INR 1.6 SCr 1  Goal of Therapy:  INR 2-3 Heparin level 0.3-0.7 units/ml Monitor  platelets by anticoagulation protocol: Yes   Plan:  Give heparin 4000 units bolus IV x 1 Start heparin IV infusion at 1000 units/hr Heparin level 6 hours after starting Daily heparin level and CBC Continue to monitor H&H and platelets    Lynann Beaver PharmD, BCPS [facility] main pharmacy [phone] 12/27/2022 7:21 PM

## 2022-12-27 NOTE — ED Notes (Signed)
Writer and paramedic at bedside to place NG tube. Pt educated and instructed on specific directions to follow when placing tube. Pt continued to fight against [place] and tense his body up. He then repeatedly shouted out, "no, no,no." He requested to be completely sedated before anyone could place tube. MD notified

## 2022-12-28 ENCOUNTER — Encounter (HOSPITAL_COMMUNITY): Payer: Self-pay

## 2022-12-28 ENCOUNTER — Inpatient Hospital Stay (HOSPITAL_COMMUNITY)
Admission: EM | Admit: 2022-12-28 | Discharge: 2023-01-02 | DRG: 389 | Disposition: A | Discharge status: Home / Self Care | Payer: Medicare Other | Attending: Internal Medicine | Admitting: Internal Medicine

## 2022-12-28 ENCOUNTER — Other Ambulatory Visit: Payer: Self-pay

## 2022-12-28 ENCOUNTER — Inpatient Hospital Stay (HOSPITAL_COMMUNITY): Payer: Medicare Other

## 2022-12-28 ENCOUNTER — Emergency Department (HOSPITAL_COMMUNITY): Payer: Medicare Other

## 2022-12-28 DIAGNOSIS — Z1152 Encounter for screening for COVID-19: Secondary | ICD-10-CM

## 2022-12-28 DIAGNOSIS — I10 Essential (primary) hypertension: Secondary | ICD-10-CM | POA: Diagnosis not present

## 2022-12-28 DIAGNOSIS — Z7901 Long term (current) use of anticoagulants: Secondary | ICD-10-CM

## 2022-12-28 DIAGNOSIS — R4589 Other symptoms and signs involving emotional state: Secondary | ICD-10-CM

## 2022-12-28 DIAGNOSIS — K56600 Partial intestinal obstruction, unspecified as to cause: Principal | ICD-10-CM | POA: Diagnosis present

## 2022-12-28 DIAGNOSIS — G809 Cerebral palsy, unspecified: Secondary | ICD-10-CM | POA: Diagnosis present

## 2022-12-28 DIAGNOSIS — Z79899 Other long term (current) drug therapy: Secondary | ICD-10-CM

## 2022-12-28 DIAGNOSIS — Z515 Encounter for palliative care: Secondary | ICD-10-CM

## 2022-12-28 DIAGNOSIS — Z888 Allergy status to other drugs, medicaments and biological substances status: Secondary | ICD-10-CM

## 2022-12-28 DIAGNOSIS — R339 Retention of urine, unspecified: Secondary | ICD-10-CM | POA: Diagnosis present

## 2022-12-28 DIAGNOSIS — I480 Paroxysmal atrial fibrillation: Secondary | ICD-10-CM | POA: Diagnosis not present

## 2022-12-28 DIAGNOSIS — E876 Hypokalemia: Secondary | ICD-10-CM | POA: Diagnosis present

## 2022-12-28 DIAGNOSIS — K56609 Unspecified intestinal obstruction, unspecified as to partial versus complete obstruction: Secondary | ICD-10-CM | POA: Diagnosis present

## 2022-12-28 DIAGNOSIS — Z7189 Other specified counseling: Secondary | ICD-10-CM

## 2022-12-28 DIAGNOSIS — R739 Hyperglycemia, unspecified: Secondary | ICD-10-CM | POA: Diagnosis present

## 2022-12-28 DIAGNOSIS — Z881 Allergy status to other antibiotic agents status: Secondary | ICD-10-CM

## 2022-12-28 DIAGNOSIS — R109 Unspecified abdominal pain: Secondary | ICD-10-CM | POA: Diagnosis present

## 2022-12-28 DIAGNOSIS — B999 Unspecified infectious disease: Principal | ICD-10-CM

## 2022-12-28 DIAGNOSIS — E872 Acidosis, unspecified: Secondary | ICD-10-CM | POA: Diagnosis present

## 2022-12-28 DIAGNOSIS — Z781 Physical restraint status: Secondary | ICD-10-CM

## 2022-12-28 DIAGNOSIS — E860 Dehydration: Secondary | ICD-10-CM | POA: Diagnosis present

## 2022-12-28 DIAGNOSIS — G8929 Other chronic pain: Secondary | ICD-10-CM | POA: Diagnosis present

## 2022-12-28 DIAGNOSIS — R000 Tachycardia, unspecified: Secondary | ICD-10-CM | POA: Diagnosis present

## 2022-12-28 DIAGNOSIS — Z885 Allergy status to narcotic agent status: Secondary | ICD-10-CM

## 2022-12-28 DIAGNOSIS — Z86718 Personal history of other venous thrombosis and embolism: Secondary | ICD-10-CM

## 2022-12-28 DIAGNOSIS — K5981 Ogilvie syndrome: Secondary | ICD-10-CM

## 2022-12-28 DIAGNOSIS — I4891 Unspecified atrial fibrillation: Secondary | ICD-10-CM | POA: Diagnosis present

## 2022-12-28 DIAGNOSIS — K8020 Calculus of gallbladder without cholecystitis without obstruction: Secondary | ICD-10-CM | POA: Diagnosis present

## 2022-12-28 DIAGNOSIS — G801 Spastic diplegic cerebral palsy: Secondary | ICD-10-CM | POA: Diagnosis present

## 2022-12-28 LAB — CBC
HCT: 40.1 % (ref 39.0–52.0)
HCT: 42.7 % (ref 39.0–52.0)
Hemoglobin: 13.4 g/dL (ref 13.0–17.0)
Hemoglobin: 13.9 g/dL (ref 13.0–17.0)
MCH: 30.9 pg (ref 26.0–34.0)
MCH: 30.6 pg (ref 26.0–34.0)
MCHC: 33.4 g/dL (ref 30.0–36.0)
MCHC: 32.6 g/dL (ref 30.0–36.0)
MCV: 92.6 fL (ref 80.0–100.0)
MCV: 94.1 fL (ref 80.0–100.0)
Platelets: 177 10*3/uL (ref 150–400)
Platelets: 196 10*3/uL (ref 150–400)
RBC: 4.33 MIL/uL (ref 4.22–5.81)
RBC: 4.54 MIL/uL (ref 4.22–5.81)
RDW: 14.4 % (ref 11.5–15.5)
RDW: 14.3 % (ref 11.5–15.5)
WBC: 5.6 10*3/uL (ref 4.0–10.5)
WBC: 7.4 10*3/uL (ref 4.0–10.5)
nRBC: 0 % (ref 0.0–0.2)
nRBC: 0 % (ref 0.0–0.2)

## 2022-12-28 LAB — COMPREHENSIVE METABOLIC PANEL
ALT: 10 U/L (ref 0–44)
ALT: 12 U/L (ref 0–44)
AST: 18 U/L (ref 15–41)
AST: 19 U/L (ref 15–41)
Albumin: 3.2 g/dL — ABNORMAL LOW (ref 3.5–5.0)
Albumin: 3.7 g/dL (ref 3.5–5.0)
Alkaline Phosphatase: 51 U/L (ref 38–126)
Alkaline Phosphatase: 64 U/L (ref 38–126)
Anion gap: 7 (ref 5–15)
Anion gap: 8 (ref 5–15)
BUN: 9 mg/dL (ref 6–20)
BUN: 8 mg/dL (ref 6–20)
CO2: 23 mmol/L (ref 22–32)
CO2: 26 mmol/L (ref 22–32)
Calcium: 7.9 mg/dL — ABNORMAL LOW (ref 8.9–10.3)
Calcium: 8.5 mg/dL — ABNORMAL LOW (ref 8.9–10.3)
Chloride: 108 mmol/L (ref 98–111)
Chloride: 108 mmol/L (ref 98–111)
Creatinine, Ser: 0.88 mg/dL (ref 0.61–1.24)
Creatinine, Ser: 0.94 mg/dL (ref 0.61–1.24)
GFR, Estimated: >60 mL/min (ref >60)
GFR, Estimated: >60 mL/min (ref >60)
Glucose, Bld: 110 mg/dL — ABNORMAL HIGH (ref 70–99)
Glucose, Bld: 110 mg/dL — ABNORMAL HIGH (ref 70–99)
Potassium: 3.9 mmol/L (ref 3.5–5.1)
Potassium: 4.1 mmol/L (ref 3.5–5.1)
Sodium: 138 mmol/L (ref 135–145)
Sodium: 142 mmol/L (ref 135–145)
Total Bilirubin: 1 mg/dL (ref <1.2)
Total Bilirubin: 1.2 mg/dL — ABNORMAL HIGH (ref <1.2)
Total Protein: 5.8 g/dL — ABNORMAL LOW (ref 6.5–8.1)
Total Protein: 6.9 g/dL (ref 6.5–8.1)

## 2022-12-28 LAB — HEPARIN LEVEL (UNFRACTIONATED)
Heparin Unfractionated: 0.37 [IU]/mL (ref 0.30–0.70)
Heparin Unfractionated: 0.46 [IU]/mL (ref 0.30–0.70)

## 2022-12-28 LAB — PROTIME-INR
INR: 1.9 — ABNORMAL HIGH (ref 0.8–1.2)
INR: 1.8 — ABNORMAL HIGH (ref 0.8–1.2)
Prothrombin Time: 22.4 s — ABNORMAL HIGH (ref 11.4–15.2)
Prothrombin Time: 20.9 s — ABNORMAL HIGH (ref 11.4–15.2)

## 2022-12-28 LAB — MAGNESIUM: Magnesium: 1.7 mg/dL (ref 1.7–2.4)

## 2022-12-28 LAB — GLUCOSE, CAPILLARY: Glucose-Capillary: 102 mg/dL — ABNORMAL HIGH (ref 70–99)

## 2022-12-28 LAB — LIPASE, BLOOD: Lipase: 27 U/L (ref 11–51)

## 2022-12-28 MED ORDER — DIAZEPAM 2 MG PO TABS
2.0000 mg | ORAL_TABLET | Freq: Two times a day (BID) | ORAL | Status: DC
  Administered 2022-12-28: 2 mg via ORAL
  Filled 2022-12-28: qty 1

## 2022-12-28 MED ORDER — TIZANIDINE HCL 2 MG PO TABS
2.0000 mg | ORAL_TABLET | Freq: Two times a day (BID) | ORAL | Status: DC
  Administered 2022-12-28: 2 mg via ORAL
  Filled 2022-12-28: qty 1

## 2022-12-28 MED ORDER — TIZANIDINE HCL 4 MG PO TABS: 4.0000 mg | ORAL_TABLET | ORAL | Status: DC

## 2022-12-28 MED ORDER — DIPHENHYDRAMINE HCL 50 MG/ML IJ SOLN
25.0000 mg | Freq: Once | INTRAMUSCULAR | Status: AC
  Administered 2022-12-28: 25 mg via INTRAVENOUS
  Filled 2022-12-28: qty 1

## 2022-12-28 MED ORDER — IOHEXOL 300 MG/ML  SOLN
100.0000 mL | Freq: Once | INTRAMUSCULAR | Status: AC | PRN
  Administered 2022-12-28: 100 mL via INTRAVENOUS

## 2022-12-28 MED ORDER — HYDROMORPHONE HCL 1 MG/ML IJ SOLN
0.5000 mg | Freq: Once | INTRAMUSCULAR | Status: AC
  Administered 2022-12-28: 0.5 mg via INTRAVENOUS
  Filled 2022-12-28: qty 1

## 2022-12-28 MED ORDER — CALCIUM CARBONATE ANTACID 500 MG PO CHEW
1.0000 | CHEWABLE_TABLET | Freq: Three times a day (TID) | ORAL | Status: DC
  Administered 2022-12-28 (×2): 200 mg via ORAL
  Filled 2022-12-28 (×2): qty 1

## 2022-12-28 MED ORDER — CYCLOBENZAPRINE HCL 10 MG PO TABS: 10.0000 mg | ORAL_TABLET | ORAL | Status: DC

## 2022-12-28 MED ORDER — TIZANIDINE HCL 2 MG PO TABS: 2.0000 mg | ORAL_TABLET | ORAL | Status: DC

## 2022-12-28 MED ORDER — POLYETHYLENE GLYCOL 3350 17 GM/SCOOP PO POWD: 17.0000 g | Freq: Every day | ORAL | Status: DC | PRN

## 2022-12-28 MED ORDER — SENNOSIDES-DOCUSATE SODIUM 8.6-50 MG PO TABS: 3.0000 | ORAL_TABLET | Freq: Every day | ORAL | Status: DC

## 2022-12-28 MED ORDER — ONDANSETRON HCL 4 MG/2ML IJ SOLN
4.0000 mg | Freq: Once | INTRAMUSCULAR | Status: AC
  Administered 2022-12-28: 4 mg via INTRAVENOUS
  Filled 2022-12-28: qty 2

## 2022-12-28 MED ORDER — FUROSEMIDE 20 MG PO TABS: 20.0000 mg | ORAL_TABLET | Freq: Every day | ORAL | Status: DC | PRN

## 2022-12-28 MED ORDER — METOPROLOL SUCCINATE ER 50 MG PO TB24
50.0000 mg | ORAL_TABLET | Freq: Every day | ORAL | Status: DC
  Administered 2022-12-28: 50 mg via ORAL
  Filled 2022-12-28: qty 1

## 2022-12-28 MED ORDER — ADULT MULTIVITAMIN W/MINERALS CH
1.0000 | ORAL_TABLET | Freq: Every day | ORAL | Status: DC
  Administered 2022-12-28: 1 via ORAL
  Filled 2022-12-28: qty 1

## 2022-12-28 MED ORDER — DEXTROMETHORPHAN POLISTIREX ER 30 MG/5ML PO SUER: 30.0000 mg | Freq: Two times a day (BID) | ORAL | Status: DC | PRN

## 2022-12-28 MED ORDER — BACLOFEN 20 MG PO TABS
20.0000 mg | ORAL_TABLET | Freq: Four times a day (QID) | ORAL | Status: DC
  Administered 2022-12-28 (×2): 20 mg via ORAL
  Filled 2022-12-28 (×2): qty 1

## 2022-12-28 MED ORDER — POLYETHYLENE GLYCOL 3350 17 G PO PACK: 17.0000 g | PACK | Freq: Every day | ORAL | Status: DC | PRN

## 2022-12-28 MED ORDER — MELATONIN 5 MG PO TABS
5.0000 mg | ORAL_TABLET | Freq: Once | ORAL | Status: AC
  Administered 2022-12-28: 5 mg via ORAL
  Filled 2022-12-28: qty 1

## 2022-12-28 MED ORDER — CYCLOBENZAPRINE HCL 10 MG PO TABS: 20.0000 mg | ORAL_TABLET | ORAL | Status: DC

## 2022-12-28 MED ORDER — CYCLOBENZAPRINE HCL 10 MG PO TABS
10.0000 mg | ORAL_TABLET | ORAL | Status: DC
  Administered 2022-12-28: 10 mg via ORAL
  Filled 2022-12-28: qty 1

## 2022-12-28 MED ORDER — LORATADINE 10 MG PO TABS
10.0000 mg | ORAL_TABLET | Freq: Every day | ORAL | Status: DC
  Administered 2022-12-28: 10 mg via ORAL
  Filled 2022-12-28: qty 1

## 2022-12-28 MED ORDER — VITAMIN D 25 MCG (1000 UNIT) PO TABS
1000.0000 [IU] | ORAL_TABLET | Freq: Two times a day (BID) | ORAL | Status: DC
  Administered 2022-12-28: 1000 [IU] via ORAL
  Filled 2022-12-28: qty 1

## 2022-12-28 NOTE — Progress Notes (Signed)
PHARMACY - ANTICOAGULATION CONSULT NOTE  Pharmacy Consult for Heparin Indication: PTA warfarin on hold, Hx Afib and DVT  Allergies  Allergen Reactions   Betadine [Povidone Iodine] Other (See Comments)    Unknown reaction per caregiver   Ciprofloxacin Other (See Comments)    Unknown reaction per caregiver   Fentanyl Other (See Comments)    Previous reaction resulting in intubation, unclear details per family   Morphine Other (See Comments)    Unknown reaction per caregiver    Patient Measurements: Weight: 75.5 kg (166 lb 7.2 oz) Last documented ht 65 inch and wt 77 kg (02/04/22) Heparin Dosing Weight:   Vital Signs: Temp: 97.8 F (36.6 C) (11/30 0411) Temp Source: Oral (11/30 0411) BP: 108/62 (11/30 0411) Pulse Rate: 79 (11/30 0411)  Labs: Recent Labs    12/27/22 0911 12/27/22 1041 12/28/22 0544  HGB 15.3 16.0  --   HCT 47.3 47.0  --   PLT 212  --   --   APTT 28  --   --   LABPROT 19.1*  --  22.4*  INR 1.6*  --  1.9*  HEPARINUNFRC  --   --  0.37  CREATININE 0.91 1.00  --     CrCl cannot be calculated (Unknown ideal weight.).   Medical History: Past Medical History:  Diagnosis Date   Cerebral palsy (HCC)    Chronic constipation    Coagulopathy (HCC)    DVT (deep venous thrombosis) (HCC)    Dysrhythmia    Hypertension    Insomnia    Pneumonia    Recurrent UTI    Tachycardia     Prior to Admission Medications:   warfarin (COUMADIN) 3 MG tablet Take 3 mg by mouth daily at 6 PM.    12/26/2022   Infusions:   dextrose 5 % and 0.45 % NaCl with KCl 20 mEq/L 100 mL/hr at 12/28/22 0336   heparin 1,000 Units/hr (12/28/22 0522)    Assessment: 32 yoM presents to ED on 11/29 with distended abdomen, vomiting, possible partial bowel obstruction vs ileus d/t constipation.  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  Warfarin was held at admission d/t NPO, and pharmacy is consulted to dose Heparin IV as INR 1.6 is  subtherapeutic.  Follow up procedural plans.    12/28/2022 HL 0.37 therapeutic on 1000 units/hr No bleeding noted  Goal of Therapy:  INR 2-3 Heparin level 0.3-0.7 units/ml Monitor platelets by anticoagulation protocol: Yes   Plan:  continue heparin IV infusion at 1000 units/hr Confirmatory level in 6 hours Daily heparin level and CBC Continue to monitor H&H and platelets   Arley Phenix RPh 12/28/2022, 6:17 AM

## 2022-12-28 NOTE — Plan of Care (Signed)
Plan of Care reviewed. 

## 2022-12-28 NOTE — TOC Initial Note (Signed)
Transition of Care [facility]) - Initial/Assessment Note    Patient Details  Name: [name] MRN: [id number] Date of Birth: 14-May-1980  Transition [facility]) CM/SW Contact:    Adrian Prows, RN Phone Number: 12/28/2022, 4:10 PM  Clinical Narrative:                 TOC for d/c planning; spoke w/ pt in room; pt says he lives at home; he plans to return at d/c' he identified POC roommate Rudi Heap [phone]); pt denies SDOH risks; he has transportation; pt verified he has PCP and insurance; he has a wheel chair, shower chair, and grab bars in shower; pt says he does not have HH services or home oxygen; no TOC needs.  Expected Discharge Plan: Home/Self Care Barriers to Discharge: No Barriers Identified   Patient Goals and CMS Choice Patient states their goals for this hospitalization and ongoing recovery are:: home CMS Medicare.gov Compare Post Acute Care list provided to:: Patient        Expected Discharge Plan and Services   Discharge Planning Services: CM Consult Post Acute Care Choice: NA Living arrangements for the past 2 months: Apartment Expected Discharge Date: 12/28/22               DME Arranged: N/A DME Agency: NA       HH Arranged: NA HH Agency: NA        Prior Living Arrangements/Services Living arrangements for the past 2 months: Apartment   Patient language and need for interpreter reviewed:: Yes Do you feel safe going back to the place where you live?: Yes      Need for Family Participation in Patient Care: Yes (Comment) Care giver support system in place?: Yes (comment) Current home services: DME (wheel chair, shower chair) Criminal Activity/Legal Involvement Pertinent to Current Situation/Hospitalization: No - Comment as needed  Activities of Daily Living      Permission Sought/Granted Permission sought to share information with : Case Manager Permission granted to share information with : Yes, Verbal Permission  Granted  Share Information with NAME: Case Manager     Permission granted to share info w Relationship: Rudi Heap (roommate) [phone]     Emotional Assessment Appearance:: Appears stated age Attitude/Demeanor/Rapport: Gracious Affect (typically observed): Accepting Orientation: : Oriented to Self, Oriented to Place, Oriented to  Time, Oriented to Situation Alcohol / Substance Use: Not Applicable Psych Involvement: No (comment)  Admission diagnosis:  Bowel obstruction (HCC) [K56.609] Small bowel obstruction (HCC) [K56.609] Patient Active Problem List   Diagnosis Date Noted   Bowel obstruction (HCC) 12/28/2022   SBO (small bowel obstruction) (HCC) 02/16/2019   Aspiration pneumonia of both lower lobes due to gastric secretions (HCC) 02/15/2018   Sacral decubitus ulcer, stage II (HCC) 02/15/2018   Community acquired pneumonia 02/14/2018   Ileus (HCC) 02/14/2018   Tachycardia, unspecified 02/14/2018   Pressure injury of skin 12/24/2017   Encephalopathy acute    Anticoagulated 12/15/2017   Respiratory failure (HCC)    Lactic acidosis 12/14/2017   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   Cellulitis of left leg 05/13/2015   Sepsis (HCC) 05/13/2015   History of DVT (deep vein thrombosis) 05/13/2015   Neurogenic bladder 05/13/2015   Spasticity 12/02/2013   Small bowel obstruction (HCC) 06/05/2011   Acute renal failure (HCC) 06/05/2011   Chronic constipation 06/05/2011   Hypertension 06/05/2011   Hypokalemia 06/05/2011   Low TSH level 06/05/2011   Groin mass 06/05/2011   Loose stools 11/15/2010   Bacteremia 10/16/2010  MSSA (methicillin susceptible Staphylococcus aureus) septicemia (HCC) 10/16/2010   Abscess of arm, left 10/16/2010   Constipation 11/29/2009   SHOULDR&UPPR ARM ABRASION/FRICTION BURN INFECTED 01/16/2007   SORE THROAT 10/30/2006   Atrial fibrillation (HCC) 08/27/2006   ANEMIA, PERNICIOUS 07/25/2006   Infantile cerebral palsy (HCC) 07/25/2006   PCP:   Fleet Contras, MD Pharmacy:   [facility] - [place], [place] - [phone] [address] [place] [address] Phone: [phone] Fax: [phone]     Social Determinants of Health (SDOH) Social History: SDOH Screenings   Food Insecurity: No Food Insecurity (12/28/2022)  Housing: Low Risk  (12/28/2022)  Transportation Needs: No Transportation Needs (12/28/2022)  Utilities: Not At Risk (12/28/2022)  Social Connections: Unknown (02/14/2018)  Tobacco Use: Low Risk  (12/27/2022)   SDOH Interventions: Food Insecurity Interventions: Intervention Not Indicated, Inpatient TOC Housing Interventions: Intervention Not Indicated, Inpatient TOC Transportation Interventions: Intervention Not Indicated, Inpatient TOC Utilities Interventions: Intervention Not Indicated, Inpatient TOC   Readmission Risk Interventions    12/28/2022    4:07 PM  Readmission Risk Prevention Plan  Transportation Screening Complete  PCP or Specialist Appt within 5-7 Days Complete  Home Care Screening Complete  Medication Review (RN CM) Complete

## 2022-12-28 NOTE — Discharge Summary (Signed)
Physician Discharge Summary  Bronze Dollens ZOX:[id number] DOB: Mar 06, 1980 DOA: 12/27/2022  PCP: Fleet Contras, MD  Admit date: 12/27/2022 Discharge date: 12/28/2022  Admitted From: Home  Discharge disposition: Home   Recommendations for Outpatient Follow-Up:   Follow up with your primary care provider in one week.  Check CBC, BMP, magnesium in the next visit  Discharge Diagnosis:   Principal Problem:   Small bowel obstruction (HCC) Active Problems:   Infantile cerebral palsy (HCC)   Atrial fibrillation (HCC)   Constipation   Hypertension   Lactic acidosis   Bowel obstruction (HCC)    Discharge Condition: Improved.  Diet recommendation: Soft diet as tolerated  Wound care: None.  Code status: Full.   History of Present Illness:   Patient is a 42 years old male with past medical history of cerebral palsy, constipation, hypertension, atrial fibrillation, DVT on Coumadin, prior history hernia repair, laparotomy and history of bowel obstruction presented to hospital with abdominal distention 1 episode of vomiting. In the ED, patient was tachycardic.  Had abdominal tenderness especially in the lower quadrants.  Labs were notable for elevated lactate at 3.7.  WBC was 9.7.  Lipase 26.  BMP within normal range except for mildly elevated total bilirubin at 1.3.  COVID influenza and RSV was negative.  Blood cultures were sent from the ED.  Chest x-ray showed no active disease.  CT scan of the abdomen and pelvis with contrast showed moderate amount of stool in the sigmoid colon and rectum with colonic dilatation possibility of ileus versus distal colonic obstruction.  Multiple dilated small bowel loops were also noted.  Surgery Dr. Carolynne Edouard was notified from the ED who recommended conservative treatment at this time and recommended follow-up in the hospital.   Hospital Course:    Abdominal distention, nausea, vomiting abdominal pain. Lactic acidosis.   History of bowel  obstruction in the past with exploratory laparotomy 2019 with lysis of adhesions.  General surgery consulted and patient underwent conservative treatment.  Had enema with good bowel movement.  Was able to tolerate oral diet subsequently..  At this time patient is considered stable for disposition home.  PT will repeat abdominal x-ray with improved gaseous distention.  History of cerebral palsy/ spasticity.  Currently being taken care of by caregiver.  On baclofen Flexeril, tizanidine at home.  Will be resumed on discharge.  Sinus tachycardia.  Improved after IV hydration.   Hypertension.  Patient is on hydrochlorothiazide, metoprolol at home.  Will resume on discharge.  History of paroxysmal atrial fibrillation/DVT.  Was on metoprolol and Coumadin.  Received heparin drip during hospitalization initially.  Will be transition to Coumadin on discharge.  Disposition.  At this time, patient is stable for disposition home with outpatient PCP follow-up.  Medical Consultants:   General surgery  Procedures:    None Subjective:   Today, patient was seen and examined at bedside.  Had a good bowel movement after enema yesterday.  Feels better.  Was able to tolerate oral diet.  Discharge Exam:   Vitals:   12/28/22 0411 12/28/22 1342  BP: 108/62 107/73  Pulse: 79 (!) 108  Resp: 18 (!) 22  Temp: 97.8 F (36.6 C) 98.1 F (36.7 C)  SpO2: 98% 97%   Vitals:   12/27/22 2351 12/28/22 0411 12/28/22 0414 12/28/22 1342  BP: 95/66 108/62  107/73  Pulse: 83 79  (!) 108  Resp: 18 18  (!) 22  Temp: 97.7 F (36.5 C) 97.8 F (36.6 C)  98.1 F (36.7  C)  TempSrc: Axillary Oral  Oral  SpO2: 97% 98%  97%  Weight:   75.5 kg    Body mass index is 27.7 kg/m.   General: Alert awake, not in obvious distress, history of cerebral palsy. HENT: pupils equally reacting to light,  No scleral pallor or icterus noted. Oral mucosa is moist.  Chest:  Clear breath sounds.   No crackles or wheezes.  CVS: S1 &S2  heard. No murmur.  Regular rate and rhythm. Abdomen: Soft, nontender, nondistended.  Bowel sounds are heard.   Extremities: No cyanosis, clubbing or edema.  Peripheral pulses are palpable. Psych: Alert, awake and Communicative, indistinct speech, CNS:  .  Moves extremities.  Contracted Skin: Warm and dry.  No rashes noted.  The results of significant diagnostics from this hospitalization (including imaging, microbiology, ancillary and laboratory) are listed below for reference.     Diagnostic Studies:   DG Abd 1 View  Result Date: 12/28/2022 CLINICAL DATA:  Bowel obstruction EXAM: ABDOMEN - 1 VIEW COMPARISON:  Abdominal CT from yesterday FINDINGS: Diffuse small and especially large bowel gas. The degree of gaseous distention is improved when compared to prior CT scanogram. Cholelithiasis over the right upper quadrant. No concerning mass effect or gas collection. IMPRESSION: Generalized gaseous distension of is improved. Electronically Signed   By: Tiburcio Pea M.D.   On: 12/28/2022 06:44   CT ABDOMEN PELVIS W CONTRAST  Result Date: 12/27/2022 CLINICAL DATA:  Acute generalized abdominal pain, nausea, vomiting. EXAM: CT ABDOMEN AND PELVIS WITH CONTRAST TECHNIQUE: Multidetector CT imaging of the abdomen and pelvis was performed using the standard protocol following bolus administration of intravenous contrast. RADIATION DOSE REDUCTION: This exam was performed according to the departmental dose-optimization program which includes automated exposure control, adjustment of the mA and/or kV according to patient size and/or use of iterative reconstruction technique. CONTRAST:  OMNIPAQUE IOHEXOL 300 MG/ML  SOLN COMPARISON:  May 14, 2021. FINDINGS: Lower chest: No acute abnormality. Hepatobiliary: Cholelithiasis. No biliary dilatation. Stable hepatic cysts are noted. Pancreas: Unremarkable. No pancreatic ductal dilatation or surrounding inflammatory changes. Spleen: Normal in size without focal  abnormality. Adrenals/Urinary Tract: Adrenal glands appear normal. No hydronephrosis or renal obstruction is noted. Bilateral renal cysts are noted for which no further follow-up is required. No hydronephrosis or renal obstruction is noted. Urinary bladder is decompressed. Stomach/Bowel: Moderate gastric distention is noted. Evaluation of bowel loops is limited due to motion artifact. Moderate amount of stool seen in sigmoid colon and rectum. Mild colonic dilatation is noted which may represent ileus or potentially distal colonic obstruction. Mildly dilated small bowel loops are noted which may represent ileus. 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 No adenopathy is noted. Reproductive: Mild prostatic enlargement is noted. Other: No abdominal wall hernia or abnormality. No abdominopelvic ascites. Musculoskeletal: No acute or significant osseous findings. IMPRESSION: Limited exam due to patient motion artifact. Moderate amount of stool seen in the sigmoid colon and rectum. Mild colonic dilatation is noted which may represent ileus or potentially distal colonic obstruction. Mildly dilated small bowel loops are noted as well as moderate gastric distention which may represent ileus. Mild prostatic enlargement. 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 Cholelithiasis. Electronically Signed   By: Lupita Raider M.D.   On:  12/27/2022 13:12   DG Chest Port 1 View  Result Date: 12/27/2022 CLINICAL DATA:  Abdominal pain, nausea, vomiting and diarrhea. EXAM: PORTABLE CHEST 1 VIEW COMPARISON:  02/16/2019. FINDINGS: Cardiac silhouette is normal in size. No mediastinal or hilar masses. Lung volumes are low. Lungs are clear. No convincing pleural effusion or  pneumothorax. Skeletal structures are grossly intact. IMPRESSION: No active disease. Electronically Signed   By: Amie Portland M.D.   On: 12/27/2022 09:48     Labs:   Basic Metabolic Panel: Recent Labs  Lab 12/27/22 0911 12/27/22 1041 12/28/22 1026  NA 141 142 138  K 4.4 4.6 3.9  CL 104 103 108  CO2 27  --  23  GLUCOSE 105* 108* 110*  BUN 13 14 9   CREATININE 0.91 1.00 0.88  CALCIUM 8.9  --  7.9*  MG  --   --  1.7   GFR CrCl cannot be calculated (Unknown ideal weight.). Liver Function Tests: Recent Labs  Lab 12/27/22 0911 12/28/22 1026  AST 21 18  ALT 15 10  ALKPHOS 62 51  BILITOT 1.3* 1.0  PROT 7.1 5.8*  ALBUMIN 3.9 3.2*   Recent Labs  Lab 12/27/22 0911  LIPASE 26   No results for input(s): "AMMONIA" in the last 168 hours. Coagulation profile Recent Labs  Lab 12/27/22 0911 12/28/22 0544  INR 1.6* 1.9*    CBC: Recent Labs  Lab 12/27/22 0911 12/27/22 1041 12/28/22 0544  WBC 9.7  --  5.6  NEUTROABS 7.9*  --   --   HGB 15.3 16.0 13.4  HCT 47.3 47.0 40.1  MCV 94.8  --  92.6  PLT 212  --  177   Cardiac Enzymes: No results for input(s): "CKTOTAL", "CKMB", "CKMBINDEX", "TROPONINI" in the last 168 hours. BNP: Invalid input(s): "POCBNP" CBG: Recent Labs  Lab 12/28/22 0743  GLUCAP 102*   D-Dimer No results for input(s): "DDIMER" in the last 72 hours. Hgb A1c No results for input(s): "HGBA1C" in the last 72 hours. Lipid Profile No results for input(s): "CHOL", "HDL", "LDLCALC", "TRIG", "CHOLHDL", "LDLDIRECT" in the last 72 hours. Thyroid function studies No results for input(s): "TSH", "T4TOTAL", "T3FREE", "THYROIDAB" in the last 72 hours.  Invalid input(s): "FREET3" Anemia work up No results for input(s): "VITAMINB12", "FOLATE", "FERRITIN", "TIBC", "IRON", "RETICCTPCT" in the last 72 hours. Microbiology Recent Results (from the past 240 hour(s))  Resp panel by RT-PCR (RSV, Flu A&B, Covid) Anterior Nasal Swab     Status: None   Collection  Time: 12/27/22  9:39 AM   Specimen: Anterior Nasal Swab  Result Value Ref Range Status   SARS Coronavirus 2 by RT PCR NEGATIVE NEGATIVE Final    Comment: (NOTE) SARS-CoV-2 target nucleic acids are NOT DETECTED.  The SARS-CoV-2 RNA is generally detectable in upper respiratory specimens during the acute phase of infection. The lowest concentration of SARS-CoV-2 viral copies this assay can detect is 138 copies/mL. A negative result does not preclude SARS-Cov-2 infection and should not be used as the sole basis for treatment or other patient management decisions. A negative result may occur with  improper specimen collection/handling, submission of specimen other than nasopharyngeal swab, presence of viral mutation(s) within the areas targeted by this assay, and inadequate number of viral copies(<138 copies/mL). A negative result must be combined with clinical observations, patient history, and epidemiological information. The expected result is Negative.  Fact Sheet for Patients:  BloggerCourse.com  Fact Sheet for Healthcare Providers:  SeriousBroker.it  This test is no t yet  approved or cleared by the [place] and  has been authorized for detection and/or diagnosis of SARS-CoV-2 by FDA under an Emergency Use Authorization (EUA). This EUA will remain  in effect (meaning this test can be used) for the duration of the COVID-19 declaration under Section 564(b)(1) of the Act, 21 U.S.C.section 360bbb-3(b)(1), unless the authorization is terminated  or revoked sooner.       Influenza A by PCR NEGATIVE NEGATIVE Final   Influenza B by PCR NEGATIVE NEGATIVE Final    Comment: (NOTE) The Xpert Xpress SARS-CoV-2/FLU/RSV plus assay is intended as an aid in the diagnosis of influenza from Nasopharyngeal swab specimens and should not be used as a sole basis for treatment. Nasal washings and aspirates are unacceptable for Xpert Xpress  SARS-CoV-2/FLU/RSV testing.  Fact Sheet for Patients: BloggerCourse.com  Fact Sheet for Healthcare Providers: SeriousBroker.it  This test is not yet approved or cleared by the [place] FDA and has been authorized for detection and/or diagnosis of SARS-CoV-2 by FDA under an Emergency Use Authorization (EUA). This EUA will remain in effect (meaning this test can be used) for the duration of the COVID-19 declaration under Section 564(b)(1) of the Act, 21 U.S.C. section 360bbb-3(b)(1), unless the authorization is terminated or revoked.     Resp Syncytial Virus by PCR NEGATIVE NEGATIVE Final    Comment: (NOTE) Fact Sheet for Patients: BloggerCourse.com  Fact Sheet for Healthcare Providers: SeriousBroker.it  This test is not yet approved or cleared by the [place] FDA and has been authorized for detection and/or diagnosis of SARS-CoV-2 by FDA under an Emergency Use Authorization (EUA). This EUA will remain in effect (meaning this test can be used) for the duration of the COVID-19 declaration under Section 564(b)(1) of the Act, 21 U.S.C. section 360bbb-3(b)(1), unless the authorization is terminated or revoked.  Performed at [facility], [address], [place], [place] [address]   Blood Culture (routine x 2)     Status: None (Preliminary result)   Collection Time: 12/27/22 10:35 AM   Specimen: BLOOD  Result Value Ref Range Status   Specimen Description   Final    BLOOD SITE NOT SPECIFIED Performed at [facility], [address], [place], [place] [address]    Special Requests   Final    BOTTLES DRAWN AEROBIC AND ANAEROBIC Blood Culture results may not be optimal due to an inadequate volume of blood received in culture bottles Performed at [facility], [address], [place], [place] [address]    Culture    Final    NO GROWTH < 24 HOURS Performed at [facility], [address], [place], [place] [address]    Report Status PENDING  Incomplete  Blood Culture (routine x 2)     Status: None (Preliminary result)   Collection Time: 12/27/22  7:15 PM   Specimen: BLOOD  Result Value Ref Range Status   Specimen Description   Final    BLOOD BLOOD LEFT HAND Performed at [facility], [address], [place], [place] [address]    Special Requests   Final    BOTTLES DRAWN AEROBIC AND ANAEROBIC Blood Culture adequate volume Performed at [facility], [address] [place], [place] [address]    Culture   Final    NO GROWTH < 12 HOURS Performed at [facility], [address], [place], [place] [address]    Report Status PENDING  Incomplete  Discharge Instructions:   Discharge Instructions     Call MD for:  persistant nausea and vomiting   Complete by: As directed    Call MD for:  severe uncontrolled pain   Complete by: As directed    Discharge instructions   Complete by: As directed    Follow-up with your primary care provider in 1 week.  Check blood work at that time.   Increase activity slowly   Complete by: As directed       Allergies as of 12/28/2022       Reactions   Betadine [povidone Iodine] Other (See Comments)   Unknown reaction per caregiver   Ciprofloxacin Other (See Comments)   Unknown reaction per caregiver   Fentanyl Other (See Comments)   Previous reaction resulting in intubation, unclear details per family   Morphine Other (See Comments)   Unknown reaction per caregiver        Medication List     TAKE these medications    acetaminophen 325 MG tablet Commonly known as: TYLENOL Take 650 mg by mouth every 6 (six) hours as needed for mild pain or moderate pain. For pain   baclofen 20 MG tablet Commonly known as: LIORESAL Take 1 tablet (20 mg total) by mouth 4 (four) times daily.   calcium carbonate  500 MG chewable tablet Commonly known as: TUMS - dosed in mg elemental calcium Chew 1 tablet by mouth 3 (three) times daily.   cetirizine 10 MG tablet Commonly known as: ZYRTEC Take 10 mg by mouth daily.   cholecalciferol 1000 units tablet Commonly known as: VITAMIN D Take 1,000 Units by mouth 2 (two) times daily.   cyclobenzaprine 10 MG tablet Commonly known as: FLEXERIL 1 at 8am and 12pm and 2 tabs at 8pm. What changed:  how much to take how to take this when to take this   dextromethorphan 30 MG/5ML liquid Commonly known as: DELSYM Take 30 mg by mouth 2 (two) times daily as needed for cough.   diazepam 2 MG tablet Commonly known as: VALIUM Take 1 tablet (2 mg total) by mouth 2 (two) times daily. Must last 30 days.   diphenhydrAMINE 25 MG tablet Commonly known as: BENADRYL Take 25 mg by mouth every 6 (six) hours as needed for allergies.   furosemide 20 MG tablet Commonly known as: LASIX Take 20 mg by mouth daily as needed for fluid (leg edema).   Gel-Kam 0.4 % Gel Generic drug: Stannous Fluoride Place 1 application onto teeth daily.   glycerin adult 2 g suppository Place 1 suppository rectally as needed for constipation.   hydrochlorothiazide 25 MG tablet Commonly known as: HYDRODIURIL Take 12.5 mg by mouth daily.   metoprolol succinate 50 MG 24 hr tablet Commonly known as: TOPROL-XL Take 1 tablet (50 mg total) by mouth daily.   multivitamin with minerals Tabs tablet Take 1 tablet by mouth daily.   nitrofurantoin 50 MG capsule Commonly known as: MACRODANTIN Take 50 mg by mouth daily. continuous   nystatin powder Commonly known as: MYCOSTATIN/NYSTOP Apply 1 Application topically at bedtime as needed (irritation).   ondansetron 4 MG disintegrating tablet Commonly known as: Zofran ODT Take 1 tablet (4 mg total) by mouth every 8 (eight) hours as needed for nausea or vomiting.   polyethylene glycol powder 17 GM/SCOOP powder Commonly known as:  GLYCOLAX/MIRALAX Take 17 g by mouth daily as needed for mild constipation, moderate constipation or severe constipation.   psyllium 95 % Pack Commonly known as: HYDROCIL/METAMUCIL Give  him one dose daily as directed on the package.  This is to help with his constipation.  You can buy this over the counter at any drug store. What changed:  how much to take how to take this when to take this reasons to take this additional instructions   senna-docusate 8.6-50 MG tablet Commonly known as: Senokot-S Take 3 tablets by mouth at bedtime.   Sodium Fluoride 5000 PPM 1.1 % Pste Generic drug: Sodium Fluoride Take 1 Application by mouth at bedtime.   tiZANidine 2 MG tablet Commonly known as: ZANAFLEX TAKE 1 TABLET BY MOUTH TWICE A DAY;TAKE 2 TABLETS (4MG ) BY MOUTH AT 12PM (DAY PROGRAM) What changed:  how much to take how to take this when to take this   traMADol 50 MG tablet Commonly known as: ULTRAM Take 100 mg by mouth every 6 (six) hours as needed for moderate pain.   warfarin 3 MG tablet Commonly known as: COUMADIN Take 3 mg by mouth daily at 6 PM.          Time coordinating discharge: 39 minutes  Signed:  Tanisha Lutes  [facility] 12/28/2022, 3:25 PM

## 2022-12-28 NOTE — Progress Notes (Signed)
Subjective/Chief Complaint: No complaints. Abd xrays improved   Objective: Vital signs in last 24 hours: Temp:  [97.7 F (36.5 C)-100.2 F (37.9 C)] 97.8 F (36.6 C) (11/30 0411) Pulse Rate:  [79-104] 79 (11/30 0411) Resp:  [18-34] 18 (11/30 0411) BP: (95-140)/(62-88) 108/62 (11/30 0411) SpO2:  [94 %-98 %] 98 % (11/30 0411) Weight:  [75.5 kg] 75.5 kg (11/30 0414) Last BM Date : 12/27/22  Intake/Output from previous day: 11/29 0701 - 11/30 0700 In: 1315.2 [P.O.:10; I.V.:1305.2] Out: 150 [Urine:150] Intake/Output this shift: No intake/output data recorded.  General appearance: alert Resp: clear to auscultation bilaterally Cardio: regular rate and rhythm GI: soft, nontender  Lab Results:  Recent Labs    12/27/22 0911 12/27/22 1041 12/28/22 0544  WBC 9.7  --  5.6  HGB 15.3 16.0 13.4  HCT 47.3 47.0 40.1  PLT 212  --  177   BMET Recent Labs    12/27/22 0911 12/27/22 1041  NA 141 142  K 4.4 4.6  CL 104 103  CO2 27  --   GLUCOSE 105* 108*  BUN 13 14  CREATININE 0.91 1.00  CALCIUM 8.9  --    PT/INR Recent Labs    12/27/22 0911 12/28/22 0544  LABPROT 19.1* 22.4*  INR 1.6* 1.9*   ABG No results for input(s): "PHART", "HCO3" in the last 72 hours.  Invalid input(s): "PCO2", "PO2"  Studies/Results: DG Abd 1 View  Result Date: 12/28/2022 CLINICAL DATA:  Bowel obstruction EXAM: ABDOMEN - 1 VIEW COMPARISON:  Abdominal CT from yesterday FINDINGS: Diffuse small and especially large bowel gas. The degree of gaseous distention is improved when compared to prior CT scanogram. Cholelithiasis over the right upper quadrant. No concerning mass effect or gas collection. IMPRESSION: Generalized gaseous distension of is improved. Electronically Signed   By: Tiburcio Pea M.D.   On: 12/28/2022 06:44   CT ABDOMEN PELVIS W CONTRAST  Result Date: 12/27/2022 CLINICAL DATA:  Acute generalized abdominal pain, nausea, vomiting. EXAM: CT ABDOMEN AND PELVIS WITH CONTRAST  TECHNIQUE: Multidetector CT imaging of the abdomen and pelvis was performed using the standard protocol following bolus administration of intravenous contrast. RADIATION DOSE REDUCTION: This exam was performed according to the departmental dose-optimization program which includes automated exposure control, adjustment of the mA and/or kV according to patient size and/or use of iterative reconstruction technique. CONTRAST:  OMNIPAQUE IOHEXOL 300 MG/ML  SOLN COMPARISON:  May 14, 2021. FINDINGS: Lower chest: No acute abnormality. Hepatobiliary: Cholelithiasis. No biliary dilatation. Stable hepatic cysts are noted. Pancreas: Unremarkable. No pancreatic ductal dilatation or surrounding inflammatory changes. Spleen: Normal in size without focal abnormality. Adrenals/Urinary Tract: Adrenal glands appear normal. No hydronephrosis or renal obstruction is noted. Bilateral renal cysts are noted for which no further follow-up is required. No hydronephrosis or renal obstruction is noted. Urinary bladder is decompressed. Stomach/Bowel: Moderate gastric distention is noted. Evaluation of bowel loops is limited due to motion artifact. Moderate amount of stool seen in sigmoid colon and rectum. Mild colonic dilatation is noted which may represent ileus or potentially distal colonic obstruction. Mildly dilated small bowel loops are noted which may represent ileus. 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 No adenopathy is noted. Reproductive: Mild prostatic enlargement is noted. Other: No abdominal wall hernia or abnormality. No abdominopelvic ascites. Musculoskeletal: No acute or significant osseous findings. IMPRESSION: Limited exam due to patient motion artifact.  Moderate amount of stool seen in the sigmoid colon and rectum. Mild colonic dilatation is noted which may represent  ileus or potentially distal colonic obstruction. Mildly dilated small bowel loops are noted as well as moderate gastric distention which may represent ileus. Mild prostatic enlargement. 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 Cholelithiasis. Electronically Signed   By: Lupita Raider M.D.   On: 12/27/2022 13:12   DG Chest Port 1 View  Result Date: 12/27/2022 CLINICAL DATA:  Abdominal pain, nausea, vomiting and diarrhea. EXAM: PORTABLE CHEST 1 VIEW COMPARISON:  02/16/2019. FINDINGS: Cardiac silhouette is normal in size. No mediastinal or hilar masses. Lung volumes are low. Lungs are clear. No convincing pleural effusion or pneumothorax. Skeletal structures are grossly intact. IMPRESSION: No active disease. Electronically Signed   By: Amie Portland M.D.   On: 12/27/2022 09:48    Anti-infectives: Anti-infectives (From admission, onward)    None       Assessment/Plan: s/p * No surgery found * Advance diet Suspect ileus with constipation rather than sbo Clinically improved Will sign off  LOS: 1 day    [name] 12/28/2022

## 2022-12-28 NOTE — Progress Notes (Signed)
Discharge instructions reviewed with patient and caregiver.  All questions answered to their satisfaction.  VSS, no signs of distress.  All personal belongings packed in bags and in caregiver possession.  Pt transported via personal wheelchair by caregiver for discharge home.

## 2022-12-28 NOTE — Progress Notes (Signed)
PHARMACY - ANTICOAGULATION CONSULT NOTE  Pharmacy Consult for Heparin Indication: PTA warfarin on hold, Hx Afib and DVT  Allergies  Allergen Reactions   Betadine [Povidone Iodine] Other (See Comments)    Unknown reaction per caregiver   Ciprofloxacin Other (See Comments)    Unknown reaction per caregiver   Fentanyl Other (See Comments)    Previous reaction resulting in intubation, unclear details per family   Morphine Other (See Comments)    Unknown reaction per caregiver    Patient Measurements: Weight: 75.5 kg (166 lb 7.2 oz) Last documented ht 65 inch and wt 77 kg (02/04/22) Heparin Dosing Weight:   Vital Signs: Temp: 97.8 F (36.6 C) (11/30 0411) Temp Source: Oral (11/30 0411) BP: 108/62 (11/30 0411) Pulse Rate: 79 (11/30 0411)  Labs: Recent Labs    12/27/22 0911 12/27/22 1041 12/28/22 0544 12/28/22 1026  HGB 15.3 16.0 13.4  --   HCT 47.3 47.0 40.1  --   PLT 212  --  177  --   APTT 28  --   --   --   LABPROT 19.1*  --  22.4*  --   INR 1.6*  --  1.9*  --   HEPARINUNFRC  --   --  0.37 0.46  CREATININE 0.91 1.00  --   --     CrCl cannot be calculated (Unknown ideal weight.).   Medical History: Past Medical History:  Diagnosis Date   Cerebral palsy (HCC)    Chronic constipation    Coagulopathy (HCC)    DVT (deep venous thrombosis) (HCC)    Dysrhythmia    Hypertension    Insomnia    Pneumonia    Recurrent UTI    Tachycardia     Prior to Admission Medications:   warfarin (COUMADIN) 3 MG tablet Take 3 mg by mouth daily at 6 PM.    12/26/2022   Infusions:   dextrose 5 % and 0.45 % NaCl with KCl 20 mEq/L 100 mL/hr at 12/28/22 0336   heparin 1,000 Units/hr (12/28/22 0522)    Assessment: 47 yoM presents to ED on 11/29 with distended abdomen, vomiting, possible partial bowel obstruction vs ileus d/t constipation.  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  Warfarin was held at admission d/t NPO, and pharmacy is  consulted to dose Heparin IV as INR 1.6 is subtherapeutic.  Follow up procedural plans.    Today, 12/28/22 HL 0.46 therapeutic on 1000 units/hr Hgb 13.4, plt 177  Scr 1.00 mg/dl ( 27/25)  No line or bleeding issues per RN     Goal of Therapy:  INR 2-3 Heparin level 0.3-0.7 units/ml Monitor platelets by anticoagulation protocol: Yes   Plan:  Continue heparin IV infusion at 1000 units/hr Daily heparin level and CBC Continue to monitor H&H and platelets    Adalberto Cole, PharmD, BCPS 12/28/2022 10:52 AM

## 2022-12-28 NOTE — ED Provider Notes (Signed)
[place] EMERGENCY DEPARTMENT AT [facility] Provider Note   CSN: [id number] Arrival date & time: 12/28/22  2204     History {Add pertinent medical, surgical, social history, OB history to HPI:1} Chief Complaint  Patient presents with   Abdominal Pain    [name] is a 42 y.o. male.  42 year old male with history of CP, DVT (on Coumadin), HTN, recurrent UTI, chronic constipation, SBO, brought in by ems from home for worsening abdominal pain after DC home today following admission for SBO. Per triage note, reportedly belched in the ambulance and felt better. Patient is currently able to answer yes/no questions. Reports having abdominal pain, no vomiting.  Level 5 caveat for difficult historian.       Home Medications Prior to Admission medications   Medication Sig Start Date End Date Taking? Authorizing Provider  acetaminophen (TYLENOL) 325 MG tablet Take 650 mg by mouth every 6 (six) hours as needed for mild pain or moderate pain. For pain    [provider]  baclofen (LIORESAL) 20 MG tablet Take 1 tablet (20 mg total) by mouth 4 (four) times daily. 05/27/19   Levert Feinstein, MD  calcium carbonate (TUMS - DOSED IN MG ELEMENTAL CALCIUM) 500 MG chewable tablet Chew 1 tablet by mouth 3 (three) times daily.    [provider]  cetirizine (ZYRTEC) 10 MG tablet Take 10 mg by mouth daily.    [provider]  cholecalciferol (VITAMIN D) 1000 UNITS tablet Take 1,000 Units by mouth 2 (two) times daily.    [provider]  cyclobenzaprine (FLEXERIL) 10 MG tablet 1 at 8am and 12pm and 2 tabs at 8pm. Patient taking differently: Take 10-20 mg by mouth See admin instructions. 1 at 8am and 12pm and 2 tabs at 8pm. 05/27/19   Levert Feinstein, MD  dextromethorphan (DELSYM) 30 MG/5ML liquid Take 30 mg by mouth 2 (two) times daily as needed for cough.    [provider]  diazepam (VALIUM) 2 MG tablet Take 1 tablet (2 mg total) by mouth 2 (two)  times daily. Must last 30 days. 05/27/19   Levert Feinstein, MD  diphenhydrAMINE (BENADRYL) 25 MG tablet Take 25 mg by mouth every 6 (six) hours as needed for allergies.    [provider]  furosemide (LASIX) 20 MG tablet Take 20 mg by mouth daily as needed for fluid (leg edema).    [provider]  glycerin adult 2 g suppository Place 1 suppository rectally as needed for constipation.    [provider]  hydrochlorothiazide (HYDRODIURIL) 25 MG tablet Take 12.5 mg by mouth daily.     [provider]  metoprolol succinate (TOPROL-XL) 50 MG 24 hr tablet Take 1 tablet (50 mg total) by mouth daily. 02/26/19   Sherrie George, PA-C  Multiple Vitamin (MULITIVITAMIN WITH MINERALS) TABS Take 1 tablet by mouth daily.    [provider]  nitrofurantoin (MACRODANTIN) 50 MG capsule Take 50 mg by mouth daily. continuous    [provider]  nystatin (MYCOSTATIN/NYSTOP) powder Apply 1 Application topically at bedtime as needed (irritation). 06/21/19   [provider]  ondansetron (ZOFRAN ODT) 4 MG disintegrating tablet Take 1 tablet (4 mg total) by mouth every 8 (eight) hours as needed for nausea or vomiting. 05/10/19   Petrucelli, Lelon Mast R, PA-C  polyethylene glycol powder (GLYCOLAX/MIRALAX) 17 GM/SCOOP powder Take 17 g by mouth daily as needed for mild constipation, moderate constipation or severe constipation. 01/29/18   [provider]  psyllium (HYDROCIL/METAMUCIL) 95 % PACK Give him one dose daily as directed on the package.  This is to help with his constipation.  You can buy this over the counter at any drug store. Patient taking differently: Take 1 packet by mouth daily as needed for mild constipation or moderate constipation. 02/25/19   Sherrie George, PA-C  senna-docusate (SENOKOT-S) 8.6-50 MG tablet Take 3 tablets by mouth at bedtime.    [provider]  Sodium Fluoride (SODIUM FLUORIDE 5000 PPM) 1.1 % PSTE Take 1 Application by  mouth at bedtime. 10/29/19   [provider]  Stannous Fluoride (GEL-KAM) 0.4 % GEL Place 1 application onto teeth daily.    [provider]  tiZANidine (ZANAFLEX) 2 MG tablet TAKE 1 TABLET BY MOUTH TWICE A DAY;TAKE 2 TABLETS (4MG ) BY MOUTH AT 12PM (DAY PROGRAM) Patient taking differently: Take 2-4 mg by mouth See admin instructions. TAKE 1 TABLET BY MOUTH TWICE A DAY;TAKE 2 TABLETS (4MG ) BY MOUTH AT 12PM (DAY PROGRAM) 05/03/20   Levert Feinstein, MD  traMADol (ULTRAM) 50 MG tablet Take 100 mg by mouth every 6 (six) hours as needed for moderate pain.    [provider]  warfarin (COUMADIN) 3 MG tablet Take 3 mg by mouth daily at 6 PM.     [provider]      Allergies    Betadine [povidone iodine], Ciprofloxacin, Fentanyl, and Morphine    Review of Systems   Review of Systems Level 5 caveat for difficult historian  Physical Exam Updated Vital Signs There were no vitals taken for this visit. Physical Exam Vitals and nursing note reviewed.  Constitutional:      General: He is not in acute distress.    Appearance: He is not diaphoretic.     Comments: Appears uncomfortable   HENT:     Head: Normocephalic and atraumatic.  Cardiovascular:     Rate and Rhythm: Normal rate and regular rhythm.     Heart sounds: Normal heart sounds.  Pulmonary:     Effort: Pulmonary effort is normal.     Breath sounds: Normal breath sounds.  Abdominal:     General: A surgical scar is present. Bowel sounds are decreased. There is distension.     Palpations: Abdomen is soft.     Tenderness: There is abdominal tenderness in the left upper quadrant and left lower quadrant.  Skin:    General: Skin is warm and dry.     Findings: No erythema or rash.  Neurological:     Mental Status: He is alert and oriented to person, place, and time.  Psychiatric:        Behavior: Behavior normal.     ED Results / Procedures / Treatments   Labs (all labs ordered are listed, but only  abnormal results are displayed) Labs Reviewed  LIPASE, BLOOD  COMPREHENSIVE METABOLIC PANEL  CBC  URINALYSIS, ROUTINE W REFLEX MICROSCOPIC  PROTIME-INR    EKG None  Radiology DG Abd 1 View  Result Date: 12/28/2022 CLINICAL DATA:  Bowel obstruction EXAM: ABDOMEN - 1 VIEW COMPARISON:  Abdominal CT from yesterday FINDINGS: Diffuse small and especially large bowel gas. The degree of gaseous distention is improved when compared to prior CT scanogram. Cholelithiasis over the right upper quadrant. No concerning mass effect or gas collection. IMPRESSION: Generalized gaseous distension of is improved. Electronically Signed   By: Tiburcio Pea M.D.   On: 12/28/2022 06:44   CT ABDOMEN PELVIS W CONTRAST  Result Date: 12/27/2022 CLINICAL DATA:  Acute generalized abdominal pain, nausea, vomiting. EXAM: CT ABDOMEN AND PELVIS WITH CONTRAST TECHNIQUE: Multidetector CT imaging of the abdomen and pelvis was performed using the standard protocol following bolus administration of intravenous contrast. RADIATION DOSE REDUCTION: This exam was performed according to the departmental dose-optimization program which includes automated exposure control, adjustment of the mA and/or kV according to patient size and/or use of iterative reconstruction technique. CONTRAST:  OMNIPAQUE IOHEXOL 300 MG/ML  SOLN COMPARISON:  May 14, 2021. FINDINGS: Lower chest: No acute abnormality. Hepatobiliary: Cholelithiasis. No biliary dilatation. Stable hepatic cysts are noted. Pancreas: Unremarkable. No pancreatic ductal dilatation or surrounding inflammatory changes. Spleen: Normal in size without focal abnormality. Adrenals/Urinary Tract: Adrenal glands appear normal. No hydronephrosis or renal obstruction is noted. Bilateral renal cysts are noted for which no further follow-up is required. No hydronephrosis or renal obstruction is noted. Urinary bladder is decompressed. Stomach/Bowel: Moderate gastric distention is noted.  Evaluation of bowel loops is limited due to motion artifact. Moderate amount of stool seen in sigmoid colon and rectum. Mild colonic dilatation is noted which may represent ileus or potentially distal colonic obstruction. Mildly dilated small bowel loops are noted which may represent ileus. 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 No adenopathy is noted. Reproductive: Mild prostatic enlargement is noted. Other: No abdominal wall hernia or abnormality. No abdominopelvic ascites. Musculoskeletal: No acute or significant osseous findings. IMPRESSION: Limited exam due to patient motion artifact. Moderate amount of stool seen in the sigmoid colon and rectum. Mild colonic dilatation is noted which may represent ileus or potentially distal colonic obstruction. Mildly dilated small bowel loops are noted as well as moderate gastric distention which may represent ileus. Mild prostatic enlargement. 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 Cholelithiasis. Electronically Signed   By: Lupita Raider M.D.   On: 12/27/2022 13:12   DG Chest Port 1 View  Result Date: 12/27/2022 CLINICAL DATA:  Abdominal pain, nausea, vomiting and diarrhea. EXAM: PORTABLE CHEST 1 VIEW COMPARISON:  02/16/2019. FINDINGS: Cardiac silhouette is normal in size. No mediastinal or hilar masses. Lung volumes are low. Lungs are clear. No convincing pleural effusion or pneumothorax. Skeletal structures are grossly intact. IMPRESSION: No active disease. Electronically Signed   By: Amie Portland M.D.   On: 12/27/2022 09:48    Procedures Procedures  {Document cardiac monitor, telemetry assessment procedure when appropriate:1}  Medications Ordered in ED Medications  HYDROmorphone  (DILAUDID) injection 0.5 mg (has no administration in time range)  ondansetron (ZOFRAN) injection 4 mg (has no administration in time range)    ED Course/ Medical Decision Making/ A&P   {   Click here for ABCD2, HEART and other calculatorsREFRESH Note before signing :1}                              Medical Decision Making Amount and/or Complexity of Data Reviewed Labs: ordered. Radiology: ordered.   This patient presents to the ED for concern of abdominal pain, this involves an extensive number of treatment options, and is a complaint that carries with it a high risk of complications and morbidity.  The differential diagnosis includes but not limited to SBO, constipation, perforation, UTI   Co morbidities that complicate the patient evaluation  As reviewed in HPI   Additional history obtained:  Additional history obtained from *** External records from outside source obtained and reviewed including ***   Lab Tests:  I Ordered, and personally interpreted labs.  The pertinent results include:  ***   Imaging Studies ordered:  I ordered imaging studies including ***  I independently visualized and interpreted imaging which showed *** I agree with the radiologist interpretation   Cardiac Monitoring: / EKG:  The patient was maintained on a cardiac monitor.  I personally viewed and interpreted the cardiac monitored which showed an underlying rhythm of: ***   Consultations Obtained:  I requested consultation with the ***,  and discussed lab and imaging findings as well as pertinent plan - they recommend: ***   Problem List / ED Course / Critical interventions / Medication management  *** I ordered medication including ***  for ***  Reevaluation of the patient after these medicines showed that the patient {resolved/improved/worsened:23923::"improved"} I have reviewed the patients home medicines and have made adjustments as needed   Social Determinants of  Health:  ***   Test / Admission - Considered:  ***   {Document critical care time when appropriate:1} {Document review of labs and clinical decision tools ie heart score, Chads2Vasc2 etc:1}  {Document your independent review of radiology images, and any outside records:1} {Document your discussion with family members, caretakers, and with consultants:1} {Document social determinants of health affecting pt's care:1} {Document your decision making why or why not admission, treatments were needed:1} Final Clinical Impression(s) / ED Diagnoses Final diagnoses:  None    Rx / DC Orders ED Discharge Orders     None

## 2022-12-28 NOTE — ED Triage Notes (Signed)
Pt. BIB [facility] for abdominal pain x1 day. States that the last few hours his pain has been unbearable. Hx of cerebral palsy and SBO. Per EMS pt. Belched and stated that he felt better.

## 2022-12-28 NOTE — Plan of Care (Signed)

## 2022-12-28 NOTE — Care Management Obs Status (Signed)
MEDICARE OBSERVATION STATUS NOTIFICATION   Patient Details  Name: [name] MRN: [id number] Date of Birth: 02-13-1980   Medicare Observation Status Notification Given:  Yes    Adrian Prows, RN 12/28/2022, 4:02 PM

## 2022-12-29 ENCOUNTER — Inpatient Hospital Stay (HOSPITAL_COMMUNITY): Payer: Medicare Other

## 2022-12-29 ENCOUNTER — Encounter (HOSPITAL_COMMUNITY): Payer: Self-pay | Admitting: Family Medicine

## 2022-12-29 ENCOUNTER — Emergency Department (HOSPITAL_COMMUNITY): Payer: Medicare Other

## 2022-12-29 DIAGNOSIS — Z781 Physical restraint status: Secondary | ICD-10-CM | POA: Diagnosis not present

## 2022-12-29 DIAGNOSIS — K5981 Ogilvie syndrome: Secondary | ICD-10-CM | POA: Diagnosis present

## 2022-12-29 DIAGNOSIS — I480 Paroxysmal atrial fibrillation: Secondary | ICD-10-CM | POA: Diagnosis present

## 2022-12-29 DIAGNOSIS — R339 Retention of urine, unspecified: Secondary | ICD-10-CM | POA: Diagnosis present

## 2022-12-29 DIAGNOSIS — Z888 Allergy status to other drugs, medicaments and biological substances status: Secondary | ICD-10-CM | POA: Diagnosis not present

## 2022-12-29 DIAGNOSIS — G8929 Other chronic pain: Secondary | ICD-10-CM | POA: Diagnosis present

## 2022-12-29 DIAGNOSIS — R109 Unspecified abdominal pain: Secondary | ICD-10-CM | POA: Diagnosis present

## 2022-12-29 DIAGNOSIS — G809 Cerebral palsy, unspecified: Secondary | ICD-10-CM | POA: Diagnosis not present

## 2022-12-29 DIAGNOSIS — R1084 Generalized abdominal pain: Secondary | ICD-10-CM | POA: Diagnosis not present

## 2022-12-29 DIAGNOSIS — E876 Hypokalemia: Secondary | ICD-10-CM | POA: Diagnosis present

## 2022-12-29 DIAGNOSIS — Z515 Encounter for palliative care: Secondary | ICD-10-CM | POA: Diagnosis not present

## 2022-12-29 DIAGNOSIS — K8020 Calculus of gallbladder without cholecystitis without obstruction: Secondary | ICD-10-CM | POA: Diagnosis present

## 2022-12-29 DIAGNOSIS — Z79899 Other long term (current) drug therapy: Secondary | ICD-10-CM | POA: Diagnosis not present

## 2022-12-29 DIAGNOSIS — I10 Essential (primary) hypertension: Secondary | ICD-10-CM

## 2022-12-29 DIAGNOSIS — K567 Ileus, unspecified: Secondary | ICD-10-CM

## 2022-12-29 DIAGNOSIS — R739 Hyperglycemia, unspecified: Secondary | ICD-10-CM | POA: Diagnosis present

## 2022-12-29 DIAGNOSIS — R933 Abnormal findings on diagnostic imaging of other parts of digestive tract: Secondary | ICD-10-CM

## 2022-12-29 DIAGNOSIS — K56600 Partial intestinal obstruction, unspecified as to cause: Secondary | ICD-10-CM | POA: Diagnosis present

## 2022-12-29 DIAGNOSIS — Z86718 Personal history of other venous thrombosis and embolism: Secondary | ICD-10-CM

## 2022-12-29 DIAGNOSIS — R000 Tachycardia, unspecified: Secondary | ICD-10-CM | POA: Diagnosis present

## 2022-12-29 DIAGNOSIS — K5909 Other constipation: Secondary | ICD-10-CM | POA: Diagnosis not present

## 2022-12-29 DIAGNOSIS — E872 Acidosis, unspecified: Secondary | ICD-10-CM | POA: Diagnosis present

## 2022-12-29 DIAGNOSIS — Z881 Allergy status to other antibiotic agents status: Secondary | ICD-10-CM | POA: Diagnosis not present

## 2022-12-29 DIAGNOSIS — Z1152 Encounter for screening for COVID-19: Secondary | ICD-10-CM | POA: Diagnosis not present

## 2022-12-29 DIAGNOSIS — B999 Unspecified infectious disease: Secondary | ICD-10-CM | POA: Diagnosis not present

## 2022-12-29 DIAGNOSIS — E860 Dehydration: Secondary | ICD-10-CM | POA: Diagnosis present

## 2022-12-29 DIAGNOSIS — G801 Spastic diplegic cerebral palsy: Secondary | ICD-10-CM | POA: Diagnosis present

## 2022-12-29 DIAGNOSIS — Z7901 Long term (current) use of anticoagulants: Secondary | ICD-10-CM | POA: Diagnosis not present

## 2022-12-29 DIAGNOSIS — Z885 Allergy status to narcotic agent status: Secondary | ICD-10-CM | POA: Diagnosis not present

## 2022-12-29 LAB — URINALYSIS, ROUTINE W REFLEX MICROSCOPIC
Bacteria, UA: NONE SEEN
Bilirubin Urine: NEGATIVE
Glucose, UA: NEGATIVE mg/dL
Hgb urine dipstick: NEGATIVE
Ketones, ur: 20 mg/dL — AB
Leukocytes,Ua: NEGATIVE
Nitrite: NEGATIVE
Protein, ur: NEGATIVE mg/dL
Specific Gravity, Urine: >1.046 — ABNORMAL HIGH (ref 1.005–1.030)
pH: 5 (ref 5.0–8.0)

## 2022-12-29 LAB — CBC
HCT: 40 % (ref 39.0–52.0)
Hemoglobin: 12.8 g/dL — ABNORMAL LOW (ref 13.0–17.0)
MCH: 31.3 pg (ref 26.0–34.0)
MCHC: 32 g/dL (ref 30.0–36.0)
MCV: 97.8 fL (ref 80.0–100.0)
Platelets: 162 10*3/uL (ref 150–400)
RBC: 4.09 MIL/uL — ABNORMAL LOW (ref 4.22–5.81)
RDW: 14.6 % (ref 11.5–15.5)
WBC: 7.9 10*3/uL (ref 4.0–10.5)
nRBC: 0 % (ref 0.0–0.2)

## 2022-12-29 LAB — MRSA NEXT GEN BY PCR, NASAL: MRSA by PCR Next Gen: NOT DETECTED

## 2022-12-29 LAB — COMPREHENSIVE METABOLIC PANEL
ALT: 12 U/L (ref 0–44)
AST: 18 U/L (ref 15–41)
Albumin: 3.1 g/dL — ABNORMAL LOW (ref 3.5–5.0)
Alkaline Phosphatase: 52 U/L (ref 38–126)
Anion gap: 11 (ref 5–15)
BUN: 10 mg/dL (ref 6–20)
CO2: 19 mmol/L — ABNORMAL LOW (ref 22–32)
Calcium: 7.8 mg/dL — ABNORMAL LOW (ref 8.9–10.3)
Chloride: 110 mmol/L (ref 98–111)
Creatinine, Ser: 0.9 mg/dL (ref 0.61–1.24)
GFR, Estimated: >60 mL/min (ref >60)
Glucose, Bld: 91 mg/dL (ref 70–99)
Potassium: 3.6 mmol/L (ref 3.5–5.1)
Sodium: 140 mmol/L (ref 135–145)
Total Bilirubin: 1.6 mg/dL — ABNORMAL HIGH (ref <1.2)
Total Protein: 5.6 g/dL — ABNORMAL LOW (ref 6.5–8.1)

## 2022-12-29 LAB — I-STAT CG4 LACTIC ACID, ED: Lactic Acid, Venous: 1 mmol/L (ref 0.5–1.9)

## 2022-12-29 LAB — LACTIC ACID, PLASMA
Lactic Acid, Venous: 1.2 mmol/L (ref 0.5–1.9)
Lactic Acid, Venous: 1.1 mmol/L (ref 0.5–1.9)

## 2022-12-29 LAB — HEPARIN LEVEL (UNFRACTIONATED)
Heparin Unfractionated: 0.12 [IU]/mL — ABNORMAL LOW (ref 0.30–0.70)
Heparin Unfractionated: 0.2 [IU]/mL — ABNORMAL LOW (ref 0.30–0.70)

## 2022-12-29 MED ORDER — PANTOPRAZOLE SODIUM 40 MG IV SOLR
40.0000 mg | INTRAVENOUS | Status: DC
  Administered 2022-12-29 – 2023-01-01 (×4): 40 mg via INTRAVENOUS
  Filled 2022-12-29 (×4): qty 10

## 2022-12-29 MED ORDER — ONDANSETRON HCL 4 MG PO TABS: 4.0000 mg | ORAL_TABLET | Freq: Four times a day (QID) | ORAL | Status: DC | PRN

## 2022-12-29 MED ORDER — HEPARIN BOLUS VIA INFUSION
4000.0000 [IU] | Freq: Once | INTRAVENOUS | Status: AC
Start: 2022-12-29 — End: 2022-12-29
  Administered 2022-12-29: 4000 [IU] via INTRAVENOUS
  Filled 2022-12-29: qty 4000

## 2022-12-29 MED ORDER — CYCLOBENZAPRINE HCL 10 MG PO TABS
20.0000 mg | ORAL_TABLET | ORAL | Status: DC
  Administered 2022-12-29: 20 mg
  Filled 2022-12-29: qty 2

## 2022-12-29 MED ORDER — ACETAMINOPHEN 650 MG RE SUPP: 650.0000 mg | Freq: Four times a day (QID) | RECTAL | Status: DC | PRN

## 2022-12-29 MED ORDER — LACTATED RINGERS IV SOLN: INTRAVENOUS | Status: DC

## 2022-12-29 MED ORDER — METOPROLOL TARTRATE 5 MG/5ML IV SOLN
2.5000 mg | INTRAVENOUS | Status: DC | PRN
  Administered 2022-12-29: 2.5 mg via INTRAVENOUS
  Filled 2022-12-29 (×2): qty 5

## 2022-12-29 MED ORDER — SODIUM CHLORIDE 0.9% FLUSH
3.0000 mL | Freq: Two times a day (BID) | INTRAVENOUS | Status: DC
  Administered 2022-12-29 – 2023-01-01 (×7): 3 mL via INTRAVENOUS

## 2022-12-29 MED ORDER — CYCLOBENZAPRINE HCL 10 MG PO TABS
10.0000 mg | ORAL_TABLET | Freq: Two times a day (BID) | ORAL | Status: DC
  Administered 2022-12-29 – 2022-12-30 (×3): 10 mg
  Filled 2022-12-29 (×3): qty 1

## 2022-12-29 MED ORDER — SENNOSIDES-DOCUSATE SODIUM 8.6-50 MG PO TABS
3.0000 | ORAL_TABLET | Freq: Every day | ORAL | Status: DC
  Filled 2022-12-29: qty 3

## 2022-12-29 MED ORDER — ACETAMINOPHEN 325 MG PO TABS: 650.0000 mg | ORAL_TABLET | Freq: Four times a day (QID) | ORAL | Status: DC | PRN

## 2022-12-29 MED ORDER — ORAL CARE MOUTH RINSE: 15.0000 mL | OROMUCOSAL | Status: DC | PRN

## 2022-12-29 MED ORDER — PIPERACILLIN-TAZOBACTAM 3.375 G IVPB
3.3750 g | Freq: Three times a day (TID) | INTRAVENOUS | Status: DC
  Administered 2022-12-29 – 2022-12-30 (×3): 3.375 g via INTRAVENOUS
  Filled 2022-12-29 (×3): qty 50

## 2022-12-29 MED ORDER — HEPARIN BOLUS VIA INFUSION
1150.0000 [IU] | Freq: Once | INTRAVENOUS | Status: AC
Start: 2022-12-29 — End: 2022-12-29
  Administered 2022-12-29: 1150 [IU] via INTRAVENOUS
  Filled 2022-12-29: qty 1150

## 2022-12-29 MED ORDER — LABETALOL HCL 5 MG/ML IV SOLN
10.0000 mg | INTRAVENOUS | Status: DC | PRN
Start: 2022-12-29 — End: 2023-01-02

## 2022-12-29 MED ORDER — GLYCERIN (ADULT) 2 G RE SUPP: 1.0000 | RECTAL | Status: DC | PRN

## 2022-12-29 MED ORDER — CHLORHEXIDINE GLUCONATE CLOTH 2 % EX PADS
6.0000 | MEDICATED_PAD | Freq: Every day | CUTANEOUS | Status: DC
  Administered 2022-12-29 – 2022-12-31 (×3): 6 via TOPICAL

## 2022-12-29 MED ORDER — IOHEXOL 350 MG/ML SOLN
100.0000 mL | Freq: Once | INTRAVENOUS | Status: AC | PRN
  Administered 2022-12-29: 100 mL via INTRAVENOUS

## 2022-12-29 MED ORDER — PIPERACILLIN-TAZOBACTAM 3.375 G IVPB 30 MIN
3.3750 g | Freq: Once | INTRAVENOUS | Status: AC
  Administered 2022-12-29: 3.375 g via INTRAVENOUS
  Filled 2022-12-29: qty 50

## 2022-12-29 MED ORDER — VANCOMYCIN HCL IN DEXTROSE 1-5 GM/200ML-% IV SOLN
1000.0000 mg | Freq: Once | INTRAVENOUS | Status: AC
  Administered 2022-12-29: 1000 mg via INTRAVENOUS
  Filled 2022-12-29: qty 200

## 2022-12-29 MED ORDER — METOPROLOL TARTRATE 25 MG/10 ML ORAL SUSPENSION
25.0000 mg | Freq: Two times a day (BID) | ORAL | Status: DC
  Administered 2022-12-29 – 2022-12-30 (×3): 25 mg
  Filled 2022-12-29 (×5): qty 10

## 2022-12-29 MED ORDER — DIAZEPAM 5 MG/ML IJ SOLN
2.5000 mg | Freq: Three times a day (TID) | INTRAMUSCULAR | Status: DC | PRN
  Administered 2022-12-29 – 2022-12-30 (×2): 2.5 mg via INTRAVENOUS
  Filled 2022-12-29 (×2): qty 2

## 2022-12-29 MED ORDER — TIZANIDINE HCL 4 MG PO TABS
4.0000 mg | ORAL_TABLET | Freq: Every day | ORAL | Status: DC
  Administered 2022-12-29 – 2022-12-30 (×2): 4 mg
  Filled 2022-12-29 (×2): qty 1

## 2022-12-29 MED ORDER — VANCOMYCIN HCL IN DEXTROSE 1-5 GM/200ML-% IV SOLN: 1000.0000 mg | Freq: Two times a day (BID) | INTRAVENOUS | Status: DC

## 2022-12-29 MED ORDER — METOPROLOL SUCCINATE ER 50 MG PO TB24: 50.0000 mg | ORAL_TABLET | Freq: Every day | ORAL | Status: DC

## 2022-12-29 MED ORDER — BACLOFEN 10 MG PO TABS
20.0000 mg | ORAL_TABLET | Freq: Four times a day (QID) | ORAL | Status: DC
Start: 2022-12-29 — End: 2022-12-30
  Administered 2022-12-29 – 2022-12-30 (×5): 20 mg
  Filled 2022-12-29 (×5): qty 2

## 2022-12-29 MED ORDER — HEPARIN (PORCINE) 25000 UT/250ML-% IV SOLN
1150.0000 [IU]/h | INTRAVENOUS | Status: DC
  Administered 2022-12-30: 1150 [IU]/h via INTRAVENOUS
  Filled 2022-12-29: qty 250

## 2022-12-29 MED ORDER — HYDROMORPHONE HCL 1 MG/ML IJ SOLN
0.5000 mg | INTRAMUSCULAR | Status: DC | PRN
  Administered 2022-12-29 – 2022-12-31 (×7): 0.5 mg via INTRAVENOUS
  Filled 2022-12-29 (×8): qty 1

## 2022-12-29 MED ORDER — DIPHENHYDRAMINE HCL 12.5 MG/5ML PO ELIX
25.0000 mg | ORAL_SOLUTION | Freq: Four times a day (QID) | ORAL | Status: DC | PRN
  Filled 2022-12-29: qty 10

## 2022-12-29 MED ORDER — SODIUM CHLORIDE 0.9 % IV BOLUS
1000.0000 mL | Freq: Once | INTRAVENOUS | Status: AC
  Administered 2022-12-29: 1000 mL via INTRAVENOUS

## 2022-12-29 MED ORDER — ONDANSETRON HCL 4 MG/2ML IJ SOLN: 4.0000 mg | Freq: Four times a day (QID) | INTRAMUSCULAR | Status: DC | PRN

## 2022-12-29 MED ORDER — SODIUM CHLORIDE 0.9 % IV BOLUS: 500.0000 mL | Freq: Once | INTRAVENOUS | Status: DC

## 2022-12-29 MED ORDER — TRAMADOL HCL 50 MG PO TABS
100.0000 mg | ORAL_TABLET | Freq: Four times a day (QID) | ORAL | Status: DC | PRN
  Administered 2022-12-29: 100 mg
  Filled 2022-12-29: qty 2

## 2022-12-29 MED ORDER — DIAZEPAM 5 MG/ML IJ SOLN
2.5000 mg | Freq: Once | INTRAMUSCULAR | Status: AC
  Administered 2022-12-29: 2.5 mg via INTRAVENOUS
  Filled 2022-12-29: qty 2

## 2022-12-29 MED ORDER — GERHARDT'S BUTT CREAM
TOPICAL_CREAM | Freq: Three times a day (TID) | CUTANEOUS | Status: DC
  Filled 2022-12-29 (×3): qty 1

## 2022-12-29 MED ORDER — DIAZEPAM 2 MG PO TABS
2.0000 mg | ORAL_TABLET | Freq: Two times a day (BID) | ORAL | Status: DC
Start: 2022-12-29 — End: 2022-12-30
  Administered 2022-12-29 – 2022-12-30 (×3): 2 mg
  Filled 2022-12-29 (×3): qty 1

## 2022-12-29 MED ORDER — TIZANIDINE HCL 4 MG PO TABS
2.0000 mg | ORAL_TABLET | Freq: Two times a day (BID) | ORAL | Status: DC
  Administered 2022-12-29 – 2022-12-30 (×2): 2 mg
  Filled 2022-12-29 (×2): qty 1

## 2022-12-29 MED ORDER — HEPARIN (PORCINE) 25000 UT/250ML-% IV SOLN
1000.0000 [IU]/h | INTRAVENOUS | Status: DC
  Administered 2022-12-29: 1000 [IU]/h via INTRAVENOUS
  Filled 2022-12-29: qty 250

## 2022-12-29 NOTE — Hospital Course (Addendum)
42 y.o. male with medical history significant for cerebral palsy/spasticity, chronic constipation/SBO history hx of ExLap LOA 2019, DVT and PAF on warfarin, SBO, and recent admission abdominal pain and N/V suspected secondary to ileus was discharged on 11/30, now returns with recurrent abdominal pain. In the ED vitals stable afebrile labs mostly normal including lactic acid. 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 Dr. Donell Beers from surgery was consulted NGT was placed and admitted for further management. 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

## 2022-12-29 NOTE — Plan of Care (Signed)
  Problem: Coping: Goal: Level of anxiety will decrease Outcome: Progressing   Problem: Elimination: Goal: Will not experience complications related to urinary retention Outcome: Progressing   Problem: Pain Management: Goal: General experience of comfort will improve Outcome: Progressing

## 2022-12-29 NOTE — ED Notes (Signed)
ED TO INPATIENT HANDOFF REPORT  ED Nurse Name and Phone #: Suzanna Obey [phone]  S Name/Age/Gender [name] 42 y.o. male Room/Bed: WA19/WA19  Code Status   Code Status: Full Code  Home/SNF/Other Home Patient oriented to: self Is this baseline? Yes   Triage Complete: Triage complete  Chief Complaint Abdominal pain [R10.9] Pain in the abdomen [R10.9]  Triage Note Pt. BIB [facility] for abdominal pain x1 day. States that the last few hours his pain has been unbearable. Hx of cerebral palsy and SBO. Per EMS pt. Belched and stated that he felt better.   Allergies Allergies  Allergen Reactions   Betadine [Povidone Iodine] Other (See Comments)    Unknown reaction per caregiver   Ciprofloxacin Other (See Comments)    Unknown reaction per caregiver   Fentanyl Other (See Comments)    Previous reaction resulting in intubation, unclear details per family   Morphine Other (See Comments)    Unknown reaction per caregiver    Level of Care/Admitting Diagnosis ED Disposition     ED Disposition  Admit   Condition  --   Comment  Hospital Area: [facility] [[id number]]  Level of Care: Stepdown [14]  Admit to SDU based on following criteria: Severe physiological/psychological symptoms:  Any diagnosis requiring assessment & intervention at least every 4 hours on an ongoing basis to obtain desired patient outcomes including stability and rehabilitation  May admit patient to [facility] or [facility] if equivalent level of care is available:: No  Covid Evaluation: Asymptomatic - no recent exposure (last 10 days) testing not required  Diagnosis: Pain in the abdomen [277117]  Admitting Physician: Lanae Boast [[id number]]  Attending Physician: Lanae Boast [[id number]]  Certification:: I certify this patient will need inpatient services for at least 2 midnights          B Medical/Surgery History Past Medical History:  Diagnosis Date   Cerebral palsy (HCC)    Chronic  constipation    Coagulopathy (HCC)    DVT (deep venous thrombosis) (HCC)    Dysrhythmia    Hypertension    Insomnia    Pneumonia    Recurrent UTI    Tachycardia    Past Surgical History:  Procedure Laterality Date   ELBOW SURGERY  09/12/2010   left   HERNIA REPAIR     INCISE AND DRAIN ABCESS  09/11/2010   LAPAROTOMY N/A 12/15/2017   Procedure: EXPLORATORY LAPAROTOMY;  Surgeon: Rodman Pickle, MD;  Location: [facility] OR;  Service: General;  Laterality: N/A;   SMALL INTESTINE SURGERY     TOOTH EXTRACTION  11/22/2011   Procedure: DENTAL RESTORATION/EXTRACTIONS;  Surgeon: Winfield Rast, MD;  Location: [facility] OR;  Service: Dentistry;  Laterality: N/A;  dental restoration and cleaning; NO EXTRACTIONS   TRANSURETHRAL RESECTION OF BLADDER TUMOR Bilateral 02/04/2022   Procedure: CYSTOSCOPY BILATERAL RETROGRADE PYELOGRAM, LEFT DIAGNOSTIC URETEROSCOPY;  Surgeon: Crista Elliot, MD;  Location: [facility] ORS;  Service: Urology;  Laterality: Bilateral;  1 HR FOR CASE     A IV Location/Drains/Wounds Patient Lines/Drains/Airways Status     Active Line/Drains/Airways     Name Placement date Placement time Site Days   Peripheral IV 12/28/22 20 G Anterior;Left Forearm 12/28/22  2250  Forearm  1   NG/OG Vented/Dual Lumen 18 Fr. Right nare Marking at nare/corner of mouth 65 cm 12/29/22  0105  Right nare  less than 1   Wound / Incision (Open or Dehisced) 12/27/22 (IAD) Incontinence Associated Dermatitis Coccyx Medial 12/27/22  1549  Coccyx  2            Intake/Output Last 24 hours  Intake/Output Summary (Last 24 hours) at 12/29/2022 1029 Last data filed at 12/29/2022 1610 Gross per 24 hour  Intake 1250.05 ml  Output --  Net 1250.05 ml    Labs/Imaging Results for orders placed or performed during the hospital encounter of 12/28/22 (from the past 48 hour(s))  Lipase, blood     Status: None   Collection Time: 12/28/22 10:53 PM  Result Value Ref Range   Lipase 27 11 - 51 U/L    Comment: Performed  at [facility], [address] [place], [place] [address]  Comprehensive metabolic panel     Status: Abnormal   Collection Time: 12/28/22 10:53 PM  Result Value Ref Range   Sodium 142 135 - 145 mmol/L   Potassium 4.1 3.5 - 5.1 mmol/L   Chloride 108 98 - 111 mmol/L   CO2 26 22 - 32 mmol/L   Glucose, Bld 110 (H) 70 - 99 mg/dL    Comment: Glucose reference range applies only to samples taken after fasting for at least 8 hours.   BUN 8 6 - 20 mg/dL   Creatinine, Ser 4.09 0.61 - 1.24 mg/dL   Calcium 8.5 (L) 8.9 - 10.3 mg/dL   Total Protein 6.9 6.5 - 8.1 g/dL   Albumin 3.7 3.5 - 5.0 g/dL   AST 19 15 - 41 U/L   ALT 12 0 - 44 U/L   Alkaline Phosphatase 64 38 - 126 U/L   Total Bilirubin 1.2 (H) <1.2 mg/dL   GFR, Estimated >81 >19 mL/min    Comment: (NOTE) Calculated using the CKD-EPI Creatinine Equation (2021)    Anion gap 8 5 - 15    Comment: Performed at [facility], [address], [place], [place] [address]  CBC     Status: None   Collection Time: 12/28/22 10:53 PM  Result Value Ref Range   WBC 7.4 4.0 - 10.5 K/uL   RBC 4.54 4.22 - 5.81 MIL/uL   Hemoglobin 13.9 13.0 - 17.0 g/dL   HCT 95.6 21.3 - 08.6 %   MCV 94.1 80.0 - 100.0 fL   MCH 30.6 26.0 - 34.0 pg   MCHC 32.6 30.0 - 36.0 g/dL   RDW 57.8 46.9 - 62.9 %   Platelets 196 150 - 400 K/uL   nRBC 0.0 0.0 - 0.2 %    Comment: Performed at [facility], [address], [place], [place] [address]  Protime-INR     Status: Abnormal   Collection Time: 12/28/22 10:53 PM  Result Value Ref Range   Prothrombin Time 20.9 (H) 11.4 - 15.2 seconds   INR 1.8 (H) 0.8 - 1.2    Comment: (NOTE) INR goal varies based on device and disease states. Performed at [facility], [address], [place], [place] [address]   I-Stat Lactic Acid     Status: None   Collection Time: 12/29/22  1:49 AM  Result Value Ref Range   Lactic Acid, Venous 1.0 0.5 - 1.9 mmol/L   Urinalysis, Routine w reflex microscopic -Urine, Clean Catch     Status: Abnormal   Collection Time: 12/29/22  3:56 AM  Result Value Ref Range   Color, Urine YELLOW YELLOW   APPearance CLEAR CLEAR   Specific Gravity, Urine >1.046 (H) 1.005 - 1.030   pH 5.0 5.0 - 8.0   Glucose, UA NEGATIVE  NEGATIVE mg/dL   Hgb urine dipstick NEGATIVE NEGATIVE   Bilirubin Urine NEGATIVE NEGATIVE   Ketones, ur 20 (A) NEGATIVE mg/dL   Protein, ur NEGATIVE NEGATIVE mg/dL   Nitrite NEGATIVE NEGATIVE   Leukocytes,Ua NEGATIVE NEGATIVE   RBC / HPF 0-5 0 - 5 RBC/hpf   WBC, UA 0-5 0 - 5 WBC/hpf   Bacteria, UA NONE SEEN NONE SEEN   Squamous Epithelial / HPF 0-5 0 - 5 /HPF   Mucus PRESENT     Comment: Performed at [facility], [address], [place], [place] [address]  Culture, blood (routine x 2)     Status: None (Preliminary result)   Collection Time: 12/29/22  4:40 AM   Specimen: BLOOD RIGHT HAND  Result Value Ref Range   Specimen Description      BLOOD RIGHT HAND Performed at [facility], [address], [place], [place] [address]    Special Requests      Blood Culture results may not be optimal due to an inadequate volume of blood received in culture bottles BOTTLES DRAWN AEROBIC AND ANAEROBIC Performed at [facility], [address] [place], [place] [address]    Culture PENDING    Report Status PENDING   Lactic acid, plasma     Status: None   Collection Time: 12/29/22  8:41 AM  Result Value Ref Range   Lactic Acid, Venous 1.2 0.5 - 1.9 mmol/L    Comment: Performed at [facility], [address] [place], [place] [address]   DG Abdomen 1 View  Result Date: 12/29/2022 CLINICAL DATA:  NG tube placement. EXAM: ABDOMEN - 1 VIEW COMPARISON:  One-view abdomen 12/28/2022 FINDINGS: The side port of the NG tube is just past the GE junction. Bowel gas pattern is normal. IMPRESSION: Side port of the NG tube is just past the GE junction.  Electronically Signed   By: Marin Roberts M.D.   On: 12/29/2022 09:10   CT Angio Chest/Abd/Pel for Dissection W and/or W/WO  Result Date: 12/29/2022 CLINICAL DATA:  Abdominal pain with abnormal CT abdomen and pelvis yesterday with portal and mesenteric venous gas. EXAM: CT ANGIOGRAPHY CHEST, ABDOMEN AND PELVIS TECHNIQUE: Noncontrast CT of the chest was initially obtained. Multidetector CT imaging through the chest, abdomen and pelvis was performed using the standard protocol during bolus administration of intravenous contrast. Multiplanar reconstructed images and MIPs were obtained and reviewed to evaluate the vascular anatomy. RADIATION DOSE REDUCTION: This exam was performed according to the departmental dose-optimization program which includes automated exposure control, adjustment of the mA and/or kV according to patient size and/or use of iterative reconstruction technique. CONTRAST:  OMNIPAQUE IOHEXOL 350 MG/ML SOLN COMPARISON:  Portable chest 12/27/2022, CTA chest 03/09/2010, CT abdomen and pelvis yesterday at 11:26 p.m. with contrast, CT abdomen pelvis 12/27/2022 with contrast, and CT abdomen and pelvis with IV contrast 05/14/2021. FINDINGS: CTA CHEST FINDINGS Cardiovascular: No arterial embolus is seen through the segmental level. The pulmonary arteries are normal in caliber. There is mild cardiomegaly with a left chamber predominance. No pulmonary venous dilatation is seen. No pericardial effusion. The aorta and great vessels are normal caliber. There is trace calcific plaque of the distal aortic arch. There is no aneurysm, dissection or stenosis. Mediastinum/Nodes: No enlarged mediastinal, hilar, or axillary lymph nodes. Thyroid gland, trachea, and esophagus demonstrate no significant findings. Lungs/Pleura: Trace right pleural effusion. No left pleural fluid. Mild posterior atelectasis both lungs. No consolidation or nodules. No pneumothorax. Diffuse  bronchial thickening.  Musculoskeletal: Mild-to-moderate thoracic dextroscoliosis. 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 No acute or other significant chest osseous findings. Subareolar gynecomastia. Review of the MIP images confirms the above findings. CTA ABDOMEN AND PELVIS FINDINGS VASCULAR Aorta: Normal. 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 Slight poststenotic ectasia with otherwise normal vessel opacification. No branch vessel occlusion. SMA: Patent without evidence of aneurysm, dissection, vasculitis or significant stenosis. No branch occlusion is seen. Renals: Both are single. Both are unremarkable. No branch occlusion is seen. IMA: Normal. Inflow: Patent without evidence of aneurysm, dissection, vasculitis or significant stenosis. Once again the left common iliac and external iliac arteries are asymmetrically larger. 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 Again noted is mesenteric venous gas in the left abdomen posterior to the stomach. Review of the MIP images confirms the above findings. NON-VASCULAR Hepatobiliary: There are small scattered hepatic cysts, and again noted portal venous gas. There are stones within a mildly dilated but otherwise unremarkable gallbladder without wall thickening. No biliary dilatation. Pancreas: No abnormality. Spleen: No abnormality. Adrenals/Urinary Tract: Small renal cysts. No follow-up imaging recommended. No adrenal mass. No evidence of obstructive uropathy or stones. Unremarkable bladder. Stomach/Bowel: Stomach is distended with air and fluid. Query wall pneumatosis posteriorly, seen on the recent CT but not the earlier studies. No small bowel dilatation is seen.  There is fluid in the colon and colonic dilatation up to 8 cm. There is no visible bowel pneumatosis. 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 This is not associated with wall thickening or inflammatory change. An appendix is not seen. Lymphatic: No adenopathy is evident. Reproductive: Enlarged prostate, 5.6 cm. Other: No free air or fluid.  No incarcerated hernia. Musculoskeletal: No acute or significant osseous findings. Review of the MIP images confirms the above findings. IMPRESSION: 1. No evidence of pulmonary arterial embolus or aortic dissection. 2. Trace right pleural effusion. 3. Diffuse bronchial thickening.  No focal infiltrate. 4. Mild cardiomegaly. 5. 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 No SMA stenosis or branch vessel occlusion. 6. 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 7. 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 8. Mesenteric venous gas in the left abdomen posterior to the stomach. Query gastric wall pneumatosis posteriorly, seen on the recent CT but not the earlier studies. Wall ischemia not excluded. 9. Fluid in the colon and colonic dilatation up to 8 cm. No visible bowel pneumatosis. 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 10. Cholelithiasis. 11. Enlarged prostate. 12. Subareolar gynecomastia. 13. Aortic atherosclerosis. Aortic Atherosclerosis (ICD10-I70.0). Electronically Signed   By: Almira Bar M.D.   On: 12/29/2022 02:24   CT ABDOMEN PELVIS W CONTRAST  Result Date: 12/29/2022 CLINICAL DATA:  Abdominal pain, acute, nonlocalized hx SBO, worsening pain Pt. BIB [facility] for abdominal pain x1  day. States that the last few hours his pain has been unbearable.  Hx of cerebral palsy and SBO. Per EMS pt. Belched and stated that he felt better. EXAM: CT ABDOMEN AND PELVIS WITH CONTRAST TECHNIQUE: Multidetector CT imaging of the abdomen and pelvis was performed using the standard protocol following bolus administration of intravenous contrast. RADIATION DOSE REDUCTION: This exam was performed according to the departmental dose-optimization program which includes automated exposure control, adjustment of the mA and/or kV according to patient size and/or use of iterative reconstruction technique. CONTRAST:  OMNIPAQUE IOHEXOL 300 MG/ML  SOLN COMPARISON:  CT abdomen pelvis 12/27/2022 FINDINGS: Lower chest: No acute abnormality. Hepatobiliary: Subcentimeter hypodensities too small. Otherwise no focal liver abnormality. Calcified gallstone noted within the gallbladder lumen. No gallbladder wall thickening or pericholecystic fluid. No biliary dilatation. Pancreas: No focal lesion. Normal pancreatic contour. No surrounding inflammatory changes. No main pancreatic ductal dilatation. Spleen: Normal in size without focal abnormality. Adrenals/Urinary Tract: No adrenal nodule bilaterally. Bilateral kidneys enhance symmetrically. Subcentimeter hypodensities are too small to characterize-no further follow-up indicated. No hydronephrosis. No hydroureter. The urinary bladder is unremarkable. Stomach/Bowel: Pneumatosis of the gastric fundus is not fully excluded (11:13). Interval resolution of small bowel dilatation. No evidence of bowel wall thickening. Fluid within the lumen of the colon. Stool noted within the rectum. Appendix appears normal. Vascular/Lymphatic: Interval development of portal venous gas and mesenteric venous gas. 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 Similar-appearing left inguinal femoral artery pseudoaneurysm versus  arteriovenous fistula measuring 3.9 x 3.2 cm. No abdominal aorta or iliac aneurysm. No abdominal, pelvic, or inguinal lymphadenopathy. Reproductive: Prostate is enlarged measuring up to 5.8 cm Other: No intraperitoneal free fluid. No intraperitoneal free gas. No organized fluid collection. Musculoskeletal: No abdominal wall hernia or abnormality. Persistent left hip subcutaneus soft tissue edema. No suspicious lytic or blastic osseous lesions. No acute displaced fracture. IMPRESSION: 1. Resolved small and large bowel dilatation with persistent diarrheal state; however, interval development of portal venous and mesenteric venous gas concerning for sequelae of associated ischemia/necrosis. Pneumatosis of the gastric fundus is not fully excluded. Recommend trending of lactate levels. No bowel perforation. 2. Cholelithiasis with no CT evidence of acute cholecystitis. 3. Prostatomegaly. 4. 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 These results were called by telephone at the time of interpretation on 12/29/2022 at 12:24 am to provider [facility] , who verbally acknowledged these results. Electronically Signed   By: Tish Frederickson M.D.   On: 12/29/2022 00:29   DG Abd 1 View  Result Date: 12/28/2022 CLINICAL DATA:  Bowel obstruction EXAM: ABDOMEN - 1 VIEW COMPARISON:  Abdominal CT from yesterday FINDINGS: Diffuse small and especially large bowel gas. The degree of gaseous distention is improved when compared to prior CT scanogram. Cholelithiasis over the right upper quadrant. No concerning mass effect or gas collection. IMPRESSION: Generalized gaseous distension of is improved. Electronically Signed   By: Tiburcio Pea M.D.   On: 12/28/2022 06:44   CT ABDOMEN PELVIS W CONTRAST  Result Date: 12/27/2022 CLINICAL DATA:   Acute generalized abdominal pain, nausea, vomiting. EXAM: CT ABDOMEN AND PELVIS WITH CONTRAST TECHNIQUE: Multidetector CT imaging of the abdomen and pelvis was performed using the standard protocol following bolus administration of intravenous contrast. RADIATION DOSE REDUCTION: This exam was performed according to the departmental dose-optimization program which includes automated exposure control, adjustment of the mA  and/or kV according to patient size and/or use of iterative reconstruction technique. CONTRAST:  OMNIPAQUE IOHEXOL 300 MG/ML  SOLN COMPARISON:  May 14, 2021. FINDINGS: Lower chest: No acute abnormality. Hepatobiliary: Cholelithiasis. No biliary dilatation. Stable hepatic cysts are noted. Pancreas: Unremarkable. No pancreatic ductal dilatation or surrounding inflammatory changes. Spleen: Normal in size without focal abnormality. Adrenals/Urinary Tract: Adrenal glands appear normal. No hydronephrosis or renal obstruction is noted. Bilateral renal cysts are noted for which no further follow-up is required. No hydronephrosis or renal obstruction is noted. Urinary bladder is decompressed. Stomach/Bowel: Moderate gastric distention is noted. Evaluation of bowel loops is limited due to motion artifact. Moderate amount of stool seen in sigmoid colon and rectum. Mild colonic dilatation is noted which may represent ileus or potentially distal colonic obstruction. Mildly dilated small bowel loops are noted which may represent ileus. 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 No adenopathy is noted. Reproductive: Mild prostatic enlargement is noted. Other: No abdominal wall hernia or abnormality. No abdominopelvic ascites. Musculoskeletal: No acute or significant osseous findings. IMPRESSION: Limited exam due to patient motion artifact. Moderate amount of stool  seen in the sigmoid colon and rectum. Mild colonic dilatation is noted which may represent ileus or potentially distal colonic obstruction. Mildly dilated small bowel loops are noted as well as moderate gastric distention which may represent ileus. Mild prostatic enlargement. 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 Cholelithiasis. Electronically Signed   By: Lupita Raider M.D.   On: 12/27/2022 13:12    Pending Labs Unresulted Labs (From admission, onward)     Start     Ordered   12/30/22 0500  HIV Antibody (routine testing w rflx)  (HIV Antibody (Routine testing w reflex) panel)  Tomorrow morning,   R        12/29/22 0533   12/30/22 0500  Basic metabolic panel  Daily,   R      12/29/22 0533   12/30/22 0500  Hepatic function panel  Tomorrow morning,   R        12/29/22 0533   12/30/22 0500  CBC  Daily,   R     Comments: Daily while on heparin drip    12/29/22 0651   12/29/22 1400  Heparin level (unfractionated)  Once-Timed,   TIMED        12/29/22 0737   12/29/22 0936  Comprehensive metabolic panel  ONCE - STAT,   STAT        12/29/22 0935   12/29/22 0936  CBC  ONCE - STAT,   STAT        12/29/22 0935   12/29/22 0824  Lactic acid, plasma  (Lactic Acid)  STAT Now then every 3 hours,   R      12/29/22 0823   12/29/22 0342  Culture, blood (routine x 2)  BLOOD CULTURE X 2,   R      12/29/22 0341            Vitals/Pain Today's Vitals   12/29/22 0600 12/29/22 0655 12/29/22 0722 12/29/22 0900  BP:   (!) 130/59 121/70  Pulse:   (!) 121 (!) 124  Resp: (!) 27 (!) 21 (!) 29 (!) 28  Temp:  98.2 F (36.8 C) 99.3 F (37.4  C)   SpO2:   98% 95%  Height:      PainSc:        Isolation Precautions No active isolations  Medications Medications  piperacillin-tazobactam (ZOSYN) IVPB 3.375 g (has no administration in time range)  sodium chloride flush (NS) 0.9 %  injection 3 mL (3 mLs Intravenous Given 12/29/22 0901)  diazepam (VALIUM) injection 2.5 mg (2.5 mg Intravenous Given 12/29/22 0901)  lactated ringers infusion ( Intravenous New Bag/Given 12/29/22 0832)  acetaminophen (TYLENOL) tablet 650 mg (has no administration in time range)    Or  acetaminophen (TYLENOL) suppository 650 mg (has no administration in time range)  HYDROmorphone (DILAUDID) injection 0.5 mg (has no administration in time range)  ondansetron (ZOFRAN) tablet 4 mg (has no administration in time range)    Or  ondansetron (ZOFRAN) injection 4 mg (has no administration in time range)  heparin ADULT infusion 100 units/mL (25000 units/238mL) (1,000 Units/hr Intravenous New Bag/Given 12/29/22 0613)  labetalol (NORMODYNE) injection 10 mg (has no administration in time range)  metoprolol tartrate (LOPRESSOR) injection 2.5 mg (has no administration in time range)  pantoprazole (PROTONIX) injection 40 mg (40 mg Intravenous Given 12/29/22 0608)  sodium chloride 0.9 % bolus 500 mL (has no administration in time range)  HYDROmorphone (DILAUDID) injection 0.5 mg (0.5 mg Intravenous Given 12/28/22 2328)  ondansetron (ZOFRAN) injection 4 mg (4 mg Intravenous Given 12/28/22 2329)  iohexol (OMNIPAQUE) 300 MG/ML solution 100 mL (100 mLs Intravenous Contrast Given 12/28/22 2331)  sodium chloride 0.9 % bolus 1,000 mL (0 mLs Intravenous Stopped 12/29/22 0408)  diazepam (VALIUM) injection 2.5 mg (2.5 mg Intravenous Given 12/29/22 0058)  iohexol (OMNIPAQUE) 350 MG/ML injection 100 mL (100 mLs Intravenous Contrast Given 12/29/22 0121)  vancomycin (VANCOCIN) IVPB 1000 mg/200 mL premix (0 mg Intravenous Stopped 12/29/22 0610)  piperacillin-tazobactam (ZOSYN) IVPB 3.375 g (0 g Intravenous Stopped 12/29/22 0500)  heparin bolus via infusion 4,000 Units (4,000 Units Intravenous Bolus from Bag 12/29/22 4098)    Mobility non-ambulatory     Focused Assessments N/A    R Recommendations: See Admitting Provider  Note  Report given to:   Additional Notes: N/A

## 2022-12-29 NOTE — Progress Notes (Signed)
Pharmacy Antibiotic Note  [name] is a 42 y.o. 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 Marland Kitchen  Pharmacy has been consulted for vanc and zosyn dosing.  Plan: Vancomycin 1gm IV x 1 then 1gm q12h (AUC 474.4, Scr 0.94) Zosyn 3.375g IV Q8H infused over 4hrs. Follow renal function and clinical course  Height: 5\' 5"  (165.1 cm) IBW/kg (Calculated) : 61.5  Temp (24hrs), Avg:97.9 F (36.6 C), Min:97.6 F (36.4 C), Max:98.1 F (36.7 C)  Recent Labs  Lab 12/27/22 0911 12/27/22 1041 12/27/22 1915 12/28/22 0544 12/28/22 1026 12/28/22 2253 12/29/22 0149  WBC 9.7  --   --  5.6  --  7.4  --   CREATININE 0.91 1.00  --   --  0.88 0.94  --   LATICACIDVEN  --  3.7* 1.9  --   --   --  1.0    Estimated Creatinine Clearance: 96.2 mL/min (by C-G formula based on SCr of 0.94 mg/dL).    Allergies  Allergen Reactions   Betadine [Povidone Iodine] Other (See Comments)    Unknown reaction per caregiver   Ciprofloxacin Other (See Comments)    Unknown reaction per caregiver   Fentanyl Other (See Comments)    Previous reaction resulting in intubation, unclear details per family   Morphine Other (See Comments)    Unknown reaction per caregiver    Antimicrobials this admission: 12/1 zosyn >> 12/1 vanc >>  Dose adjustments this admission:   Microbiology results: 12/1 BCx:   Thank you for allowing pharmacy to be a part of this patient's care.  Arley Phenix RPh 12/29/2022, 5:02 AM

## 2022-12-29 NOTE — Progress Notes (Signed)
PROGRESS NOTE [name]  ZOX:[id number] DOB: 1980/09/17 DOA: 12/28/2022 PCP: Fleet Contras, MD  Brief Narrative/Hospital Course: 42 y.o. male with medical history significant for cerebral palsy/spasticity, chronic constipation/SBO history hx of ExLap LOA 2019, DVT and PAF on warfarin, SBO, and recent admission abdominal pain and N/V suspected secondary to ileus was discharged on 11/30, now returns with recurrent abdominal pain. In the ED vitals stable afebrile labs mostly normal including lactic acid. 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 Dr. Donell Beers from surgery was consulted NGT was placed and admitted for further management.   Subjective: Seen this am Alert awake mild confusion, difficult to understands NGT got ripped off and now back in and on mitten He just had BM and wants to be cleaned per nursing staff He nods for response   Assessment and Plan: Principal Problem:   Abdominal pain Active Problems:   Infantile cerebral palsy (HCC)   Atrial fibrillation (HCC)   Hypertension   History of DVT (deep vein thrombosis)   Pain in the abdomen   Adynamic ileus w/ pain Chronic constipation-Recurrent history of SBO- hx of ExLap LOA 2019 Portal/mesenteric veinous gas/?Gastric pneumatosis per CT: Hemodynamically stable with normal lactic acid and her abdomen is soft.imaging showed questionable findings unclear significance, but small and large bowel dilatation has resolved, showing diarrheal state.  Admitted with NG tube decompression IV fluid empiric Zosyn pain management.  Surgery been consulted-seen already no need for surgical intervention no clinical  evidence of ischemic bowel advised GI consult to manage patient's adynamic ileus and resume chronic constipation regimen. Consulted [facility].  Hopefully can DC NG tube soon await GI inputs.  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  Hypertension: BP stable, cont prn.  Cerebral palsy/spasticity: Supportive care. PTA on Flexeril/tizanidine/baclofen  Cholelithiasis: Monitor  DVT prophylaxis: heparin. Code Status:   Code Status: Full Code Family Communication: plan of care discussed with patient at bedside. Patient status is: Inpatient because of chronic constipation with ileus and abdominal pain Level of care: Stepdown   Dispo: The patient is from: home            Anticipated disposition: tbd Objective: Vitals last 24 hrs: Vitals:   12/29/22 0600 12/29/22 0655 12/29/22 0722 12/29/22 0900  BP:   (!) 130/59 121/70  Pulse:   (!) 121 (!) 124  Resp: (!) 27 (!) 21 (!) 29 (!) 28  Temp:  98.2 F (36.8 C) 99.3 F (37.4 C)   SpO2:   98% 95%  Height:       Weight change:   Physical Examination: General exam: alert awake, mildly agitated, speaks with difficulty and difficult to comprehend HEENT:Oral mucosa moist, Ear/Nose WNL grossly Respiratory system: bilaterally clear BS, no use of accessory muscle Cardiovascular system: S1 & S2 +, No JVD. Gastrointestinal system: Abdomen soft,NT,ND, BS+, NGT+ Nervous System:Alert, awake, moving extremities. Extremities: LE edema neg, chronic left leg hypertrophy  Skin: No rashes,no icterus. MSK: Normal muscle bulk,tone, power  Medications reviewed:  Scheduled Meds:  pantoprazole (PROTONIX) IV  40 mg Intravenous Q24H   sodium chloride flush  3 mL Intravenous Q12H   Continuous Infusions:  heparin 1,000  Units/hr (12/29/22 1610)   lactated ringers 125 mL/hr at 12/29/22 0832   piperacillin-tazobactam (ZOSYN)  IV     sodium chloride       Diet Order             Diet NPO time specified  Diet effective now                  Intake/Output Summary (Last 24 hours) at 12/29/2022 1014 Last data filed at 12/29/2022 0610 Gross per 24 hour  Intake 1250.05 ml  Output --  Net 1250.05 ml   Net IO Since Admission: 1,250.05 mL [12/29/22 1014]  Wt Readings from Last 3 Encounters:  12/28/22 75.5 kg  02/04/22 77 kg  01/25/22 77.1 kg     Unresulted Labs (From admission, onward)     Start     Ordered   12/30/22 0500  HIV Antibody (routine testing w rflx)  (HIV Antibody (Routine testing w reflex) panel)  Tomorrow morning,   R        12/29/22 0533   12/30/22 0500  Basic metabolic panel  Daily,   R      12/29/22 0533   12/30/22 0500  Hepatic function panel  Tomorrow morning,   R        12/29/22 0533   12/30/22 0500  CBC  Daily,   R     Comments: Daily while on heparin drip    12/29/22 0651   12/29/22 1400  Heparin level (unfractionated)  Once-Timed,   TIMED        12/29/22 0737   12/29/22 0936  Comprehensive metabolic panel  ONCE - STAT,   STAT        12/29/22 0935   12/29/22 0936  CBC  ONCE - STAT,   STAT        12/29/22 0935   12/29/22 0824  Lactic acid, plasma  (Lactic Acid)  STAT Now then every 3 hours,   R (with STAT occurrences)      12/29/22 0823   12/29/22 0342  Culture, blood (routine x 2)  BLOOD CULTURE X 2,   R (with STAT occurrences)      12/29/22 0341          Data Reviewed: I have personally reviewed following labs and imaging studies CBC: Recent Labs  Lab 12/27/22 0911 12/27/22 1041 12/28/22 0544 12/28/22 2253  WBC 9.7  --  5.6 7.4  NEUTROABS 7.9*  --   --   --   HGB 15.3 16.0 13.4 13.9  HCT 47.3 47.0 40.1 42.7  MCV 94.8  --  92.6 94.1  PLT 212  --  177 196   Basic Metabolic Panel:  Recent Labs  Lab 12/27/22 0911 12/27/22 1041 12/28/22 1026  12/28/22 2253  NA 141 142 138 142  K 4.4 4.6 3.9 4.1  CL 104 103 108 108  CO2 27  --  23 26  GLUCOSE 105* 108* 110* 110*  BUN 13 14 9 8   CREATININE 0.91 1.00 0.88 0.94  CALCIUM 8.9  --  7.9* 8.5*  MG  --   --  1.7  --    GFR: Estimated Creatinine Clearance: 96.2 mL/min (by C-G formula based on SCr of 0.94 mg/dL). Liver Function Tests:  Recent Labs  Lab 12/27/22 0911 12/28/22 1026 12/28/22 2253  AST 21 18 19   ALT  15 10 12   ALKPHOS 62 51 64  BILITOT 1.3* 1.0 1.2*  PROT 7.1 5.8* 6.9  ALBUMIN 3.9 3.2* 3.7   Recent Labs  Lab 12/27/22 0911 12/28/22 2253  LIPASE 26 27   No results for input(s): "AMMONIA" in the last 168 hours. Coagulation Profile:  Recent Labs  Lab 12/27/22 0911 12/28/22 0544 12/28/22 2253  INR 1.6* 1.9* 1.8*   Recent Labs  Lab 12/28/22 0743  GLUCAP 102*   Sepsis Labs: Recent Labs  Lab 12/27/22 1041 12/27/22 1915 12/29/22 0149 12/29/22 0841  LATICACIDVEN 3.7* 1.9 1.0 1.2    Recent Results (from the past 240 hour(s))  Resp panel by RT-PCR (RSV, Flu A&B, Covid) Anterior Nasal Swab     Status: None   Collection Time: 12/27/22  9:39 AM   Specimen: Anterior Nasal Swab  Result Value Ref Range Status   SARS Coronavirus 2 by RT PCR NEGATIVE NEGATIVE Final    Comment: (NOTE) SARS-CoV-2 target nucleic acids are NOT DETECTED.  The SARS-CoV-2 RNA is generally detectable in upper respiratory specimens during the acute phase of infection. The lowest concentration of SARS-CoV-2 viral copies this assay can detect is 138 copies/mL. A negative result does not preclude SARS-Cov-2 infection and should not be used as the sole basis for treatment or other patient management decisions. A negative result may occur with  improper specimen collection/handling, submission of specimen other than nasopharyngeal swab, presence of viral mutation(s) within the areas targeted by this assay, and inadequate number of viral copies(<138 copies/mL). A negative result must  be combined with clinical observations, patient history, and epidemiological information. The expected result is Negative.  Fact Sheet for Patients:  BloggerCourse.com  Fact Sheet for Healthcare Providers:  SeriousBroker.it  This test is no t yet approved or cleared by the [place] FDA and  has been authorized for detection and/or diagnosis of SARS-CoV-2 by FDA under an Emergency Use Authorization (EUA). This EUA will remain  in effect (meaning this test can be used) for the duration of the COVID-19 declaration under Section 564(b)(1) of the Act, 21 U.S.C.section 360bbb-3(b)(1), unless the authorization is terminated  or revoked sooner.       Influenza A by PCR NEGATIVE NEGATIVE Final   Influenza B by PCR NEGATIVE NEGATIVE Final    Comment: (NOTE) The Xpert Xpress SARS-CoV-2/FLU/RSV plus assay is intended as an aid in the diagnosis of influenza from Nasopharyngeal swab specimens and should not be used as a sole basis for treatment. Nasal washings and aspirates are unacceptable for Xpert Xpress SARS-CoV-2/FLU/RSV testing.  Fact Sheet for Patients: BloggerCourse.com  Fact Sheet for Healthcare Providers: SeriousBroker.it  This test is not yet approved or cleared by the [place] FDA and has been authorized for detection and/or diagnosis of SARS-CoV-2 by FDA under an Emergency Use Authorization (EUA). This EUA will remain in effect (meaning this test can be used) for the duration of the COVID-19 declaration under Section 564(b)(1) of the Act, 21 U.S.C. section 360bbb-3(b)(1), unless the authorization is terminated or revoked.     Resp Syncytial Virus by PCR NEGATIVE NEGATIVE Final    Comment: (NOTE) Fact Sheet for Patients: BloggerCourse.com  Fact Sheet for Healthcare Providers: SeriousBroker.it  This test is not  yet approved or cleared by the [place] FDA and has been authorized for detection and/or diagnosis of SARS-CoV-2 by FDA under an Emergency Use Authorization (EUA). This EUA will remain in effect (meaning this test can be used) for the duration of the COVID-19 declaration under  Section 564(b)(1) of the Act, 21 U.S.C. section 360bbb-3(b)(1), unless the authorization is terminated or revoked.  Performed at [facility], [address] [place], [place] [address]   Blood Culture (routine x 2)     Status: None (Preliminary result)   Collection Time: 12/27/22 10:35 AM   Specimen: BLOOD  Result Value Ref Range Status   Specimen Description   Final    BLOOD SITE NOT SPECIFIED Performed at [facility], [address] [place], [place] [address]    Special Requests   Final    BOTTLES DRAWN AEROBIC AND ANAEROBIC Blood Culture results may not be optimal due to an inadequate volume of blood received in culture bottles Performed at [facility], [address] [place], [place] [address]    Culture   Final    NO GROWTH 2 DAYS Performed at [facility], [address], [place], [place] [address]    Report Status PENDING  Incomplete  Blood Culture (routine x 2)     Status: None (Preliminary result)   Collection Time: 12/27/22  7:15 PM   Specimen: BLOOD  Result Value Ref Range Status   Specimen Description   Final    BLOOD BLOOD LEFT HAND Performed at [facility], [address], [place], [place] [address]    Special Requests   Final    BOTTLES DRAWN AEROBIC AND ANAEROBIC Blood Culture adequate volume Performed at [facility], [address], [place], [place] [address]    Culture   Final    NO GROWTH 2 DAYS Performed at [facility], [address] [place], [place] [address]    Report Status PENDING  Incomplete  Culture, blood (routine x 2)     Status: None (Preliminary  result)   Collection Time: 12/29/22  4:40 AM   Specimen: BLOOD RIGHT HAND  Result Value Ref Range Status   Specimen Description   Final    BLOOD RIGHT HAND Performed at [facility], [address], [place], [place] [address]    Special Requests   Final    Blood Culture results may not be optimal due to an inadequate volume of blood received in culture bottles BOTTLES DRAWN AEROBIC AND ANAEROBIC Performed at [facility], [address], [place], [place] [address]    Culture PENDING  Incomplete   Report Status PENDING  Incomplete    Antimicrobials: Anti-infectives (From admission, onward)    Start     Dose/Rate Route Frequency Ordered Stop   12/29/22 1800  vancomycin (VANCOCIN) IVPB 1000 mg/200 mL premix  Status:  Discontinued        1,000 mg 200 mL/hr over 60 Minutes Intravenous Every 12 hours 12/29/22 0503 12/29/22 0533   12/29/22 1200  piperacillin-tazobactam (ZOSYN) IVPB 3.375 g        3.375 g 12.5 mL/hr over 240 Minutes Intravenous Every 8 hours 12/29/22 0456     12/29/22 0415  vancomycin (VANCOCIN) IVPB 1000 mg/200 mL premix        1,000 mg 200 mL/hr over 60 Minutes Intravenous  Once 12/29/22 0407 12/29/22 0610   12/29/22 0415  piperacillin-tazobactam (ZOSYN) IVPB 3.375 g        3.375 g 100 mL/hr over 30 Minutes Intravenous  Once 12/29/22 0407 12/29/22 0500      Culture/Microbiology    Component Value Date/Time   SDES  12/29/2022 0440    BLOOD RIGHT HAND Performed  at [facility], [address] [place], [place] [address]    [facility]  12/29/2022 0440    Blood Culture results may not be optimal due to an inadequate volume of blood received in culture bottles BOTTLES DRAWN AEROBIC AND ANAEROBIC Performed at [facility], [address], [place], [place] [address]    CULT PENDING 12/29/2022 0440   REPTSTATUS PENDING 12/29/2022 0440    Radiology Studies: DG Abdomen 1 View  Result Date: 12/29/2022 CLINICAL DATA:   NG tube placement. EXAM: ABDOMEN - 1 VIEW COMPARISON:  One-view abdomen 12/28/2022 FINDINGS: The side port of the NG tube is just past the GE junction. Bowel gas pattern is normal. IMPRESSION: Side port of the NG tube is just past the GE junction. Electronically Signed   By: Marin Roberts M.D.   On: 12/29/2022 09:10   CT Angio Chest/Abd/Pel for Dissection W and/or W/WO  Result Date: 12/29/2022 CLINICAL DATA:  Abdominal pain with abnormal CT abdomen and pelvis yesterday with portal and mesenteric venous gas. EXAM: CT ANGIOGRAPHY CHEST, ABDOMEN AND PELVIS TECHNIQUE: Noncontrast CT of the chest was initially obtained. Multidetector CT imaging through the chest, abdomen and pelvis was performed using the standard protocol during bolus administration of intravenous contrast. Multiplanar reconstructed images and MIPs were obtained and reviewed to evaluate the vascular anatomy. RADIATION DOSE REDUCTION: This exam was performed according to the departmental dose-optimization program which includes automated exposure control, adjustment of the mA and/or kV according to patient size and/or use of iterative reconstruction technique. CONTRAST:  OMNIPAQUE IOHEXOL 350 MG/ML SOLN COMPARISON:  Portable chest 12/27/2022, CTA chest 03/09/2010, CT abdomen and pelvis yesterday at 11:26 p.m. with contrast, CT abdomen pelvis 12/27/2022 with contrast, and CT abdomen and pelvis with IV contrast 05/14/2021. FINDINGS: CTA CHEST FINDINGS Cardiovascular: No arterial embolus is seen through the segmental level. The pulmonary arteries are normal in caliber. There is mild cardiomegaly with a left chamber predominance. No pulmonary venous dilatation is seen. No pericardial effusion. The aorta and great vessels are normal caliber. There is trace calcific plaque of the distal aortic arch. There is no aneurysm, dissection or stenosis. Mediastinum/Nodes: No enlarged mediastinal, hilar, or axillary lymph nodes. Thyroid gland, trachea,  and esophagus demonstrate no significant findings. Lungs/Pleura: Trace right pleural effusion. No left pleural fluid. Mild posterior atelectasis both lungs. No consolidation or nodules. No pneumothorax. Diffuse bronchial thickening. Musculoskeletal: Mild-to-moderate thoracic dextroscoliosis. 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 No acute or other significant chest osseous findings. Subareolar gynecomastia. Review of the MIP images confirms the above findings. CTA ABDOMEN AND PELVIS FINDINGS VASCULAR Aorta: Normal. 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 Slight poststenotic ectasia with otherwise normal vessel opacification. No branch vessel occlusion. SMA: Patent without evidence of aneurysm, dissection, vasculitis or significant stenosis. No branch occlusion is seen. Renals: Both are single. Both are unremarkable. No branch occlusion is seen. IMA: Normal. Inflow: Patent without evidence of aneurysm, dissection, vasculitis or significant stenosis. Once again the left common iliac and external iliac arteries are asymmetrically larger. 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 Again noted is mesenteric venous gas in the left abdomen posterior to the stomach. Review of the MIP images confirms the above  findings. NON-VASCULAR Hepatobiliary: There are small scattered hepatic cysts, and again noted portal venous gas. There are stones within a mildly dilated but otherwise unremarkable gallbladder without wall thickening. No biliary dilatation. Pancreas: No abnormality. Spleen: No abnormality. Adrenals/Urinary Tract: Small renal cysts. No follow-up imaging recommended. No adrenal mass. No evidence of  obstructive uropathy or stones. Unremarkable bladder. Stomach/Bowel: Stomach is distended with air and fluid. Query wall pneumatosis posteriorly, seen on the recent CT but not the earlier studies. No small bowel dilatation is seen. There is fluid in the colon and colonic dilatation up to 8 cm. There is no visible bowel pneumatosis. 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 This is not associated with wall thickening or inflammatory change. An appendix is not seen. Lymphatic: No adenopathy is evident. Reproductive: Enlarged prostate, 5.6 cm. Other: No free air or fluid.  No incarcerated hernia. Musculoskeletal: No acute or significant osseous findings. Review of the MIP images confirms the above findings. IMPRESSION: 1. No evidence of pulmonary arterial embolus or aortic dissection. 2. Trace right pleural effusion. 3. Diffuse bronchial thickening.  No focal infiltrate. 4. Mild cardiomegaly. 5. 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 No SMA stenosis or branch vessel occlusion. 6. 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 7. 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 8. Mesenteric venous gas in the left abdomen posterior to the stomach. Query gastric wall pneumatosis posteriorly, seen on the recent CT but not the earlier studies. Wall ischemia not excluded. 9. Fluid in the colon and colonic dilatation up to 8 cm. No visible bowel pneumatosis. 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 10. Cholelithiasis. 11. Enlarged prostate. 12. Subareolar gynecomastia. 13. Aortic atherosclerosis. Aortic Atherosclerosis (ICD10-I70.0).  Electronically Signed   By: Almira Bar M.D.   On: 12/29/2022 02:24   CT ABDOMEN PELVIS W CONTRAST  Result Date: 12/29/2022 CLINICAL DATA:  Abdominal pain, acute, nonlocalized hx SBO, worsening pain Pt. BIB [facility] for abdominal pain x1 day. States that the last few hours his pain has been unbearable. Hx of cerebral palsy and SBO. Per EMS pt. Belched and stated that he felt better. EXAM: CT ABDOMEN AND PELVIS WITH CONTRAST TECHNIQUE: Multidetector CT imaging of the abdomen and pelvis was performed using the standard protocol following bolus administration of intravenous contrast. RADIATION DOSE REDUCTION: This exam was performed according to the departmental dose-optimization program which includes automated exposure control, adjustment of the mA and/or kV according to patient size and/or use of iterative reconstruction technique. CONTRAST:  OMNIPAQUE IOHEXOL 300 MG/ML  SOLN COMPARISON:  CT abdomen pelvis 12/27/2022 FINDINGS: Lower chest: No acute abnormality. Hepatobiliary: Subcentimeter hypodensities too small. Otherwise no focal liver abnormality. Calcified gallstone noted within the gallbladder lumen. No gallbladder wall thickening or pericholecystic fluid. No biliary dilatation. Pancreas: No focal lesion. Normal pancreatic contour. No surrounding inflammatory changes. No main pancreatic ductal dilatation. Spleen: Normal in size without focal abnormality. Adrenals/Urinary Tract: No adrenal nodule bilaterally. Bilateral kidneys enhance symmetrically. Subcentimeter hypodensities are too small to characterize-no further follow-up indicated. No hydronephrosis. No hydroureter. The urinary bladder is unremarkable. Stomach/Bowel: Pneumatosis of the gastric fundus is not fully excluded (11:13). Interval resolution of  small bowel dilatation. No evidence of bowel wall thickening. Fluid within the lumen of the colon. Stool noted within the rectum. Appendix appears normal. Vascular/Lymphatic: Interval development  of portal venous gas and mesenteric venous gas. 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 No abdominal aorta or iliac aneurysm. No abdominal, pelvic, or inguinal lymphadenopathy. Reproductive: Prostate is enlarged measuring up to 5.8 cm Other: No intraperitoneal free fluid. No intraperitoneal free gas. No organized fluid collection. Musculoskeletal: No abdominal wall hernia or abnormality. Persistent left hip subcutaneus soft tissue edema. No suspicious lytic or blastic osseous lesions. No acute displaced fracture. IMPRESSION: 1. Resolved small and large bowel dilatation with persistent diarrheal state; however, interval development of portal venous and mesenteric venous gas concerning for sequelae of associated ischemia/necrosis. Pneumatosis of the gastric fundus is not fully excluded. Recommend trending of lactate levels. No bowel perforation. 2. Cholelithiasis with no CT evidence of acute cholecystitis. 3. Prostatomegaly. 4. 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 These results were called by telephone at the time of interpretation on 12/29/2022 at 12:24 am to provider [facility] , who verbally acknowledged these results. Electronically Signed   By: Tish Frederickson M.D.   On: 12/29/2022 00:29   DG Abd 1 View  Result Date: 12/28/2022 CLINICAL DATA:  Bowel obstruction EXAM: ABDOMEN - 1 VIEW COMPARISON:  Abdominal CT from yesterday FINDINGS: Diffuse small and especially large bowel gas. The degree of gaseous distention is improved when compared to prior CT scanogram. Cholelithiasis over the right upper quadrant. No concerning mass  effect or gas collection. IMPRESSION: Generalized gaseous distension of is improved. Electronically Signed   By: Tiburcio Pea M.D.   On: 12/28/2022 06:44   CT ABDOMEN PELVIS W CONTRAST  Result Date: 12/27/2022 CLINICAL DATA:  Acute generalized abdominal pain, nausea, vomiting. EXAM: CT ABDOMEN AND PELVIS WITH CONTRAST TECHNIQUE: Multidetector CT imaging of the abdomen and pelvis was performed using the standard protocol following bolus administration of intravenous contrast. RADIATION DOSE REDUCTION: This exam was performed according to the departmental dose-optimization program which includes automated exposure control, adjustment of the mA and/or kV according to patient size and/or use of iterative reconstruction technique. CONTRAST:  OMNIPAQUE IOHEXOL 300 MG/ML  SOLN COMPARISON:  May 14, 2021. FINDINGS: Lower chest: No acute abnormality. Hepatobiliary: Cholelithiasis. No biliary dilatation. Stable hepatic cysts are noted. Pancreas: Unremarkable. No pancreatic ductal dilatation or surrounding inflammatory changes. Spleen: Normal in size without focal abnormality. Adrenals/Urinary Tract: Adrenal glands appear normal. No hydronephrosis or renal obstruction is noted. Bilateral renal cysts are noted for which no further follow-up is required. No hydronephrosis or renal obstruction is noted. Urinary bladder is decompressed. Stomach/Bowel: Moderate gastric distention is noted. Evaluation of bowel loops is limited due to motion artifact. Moderate amount of stool seen in sigmoid colon and rectum. Mild colonic dilatation is noted which may represent ileus or potentially distal colonic obstruction. Mildly dilated small bowel loops are noted which may represent ileus. 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 No adenopathy is noted. Reproductive: Mild prostatic  enlargement is noted. Other: No abdominal wall hernia or abnormality.  No abdominopelvic ascites. Musculoskeletal: No acute or significant osseous findings. IMPRESSION: Limited exam due to patient motion artifact. Moderate amount of stool seen in the sigmoid colon and rectum. Mild colonic dilatation is noted which may represent ileus or potentially distal colonic obstruction. Mildly dilated small bowel loops are noted as well as moderate gastric distention which may represent ileus. Mild prostatic enlargement. 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 Cholelithiasis. Electronically Signed   By: Lupita Raider M.D.   On: 12/27/2022 13:12     LOS: 0 days   Total time spent in review of labs and imaging, patient evaluation, formulation of plan, documentation and communication with family: 35 minutes  Lanae Boast, MD [facility]  12/29/2022, 10:14 AM

## 2022-12-29 NOTE — ED Notes (Signed)
Md at bedside, states he is upgrading patient to Step down unit.

## 2022-12-29 NOTE — ED Notes (Signed)
Spoke with Dr. About pt's continued tachycardia. Dr. Algis Downs to hold off on labetalol unless heart rate above > 130s.

## 2022-12-29 NOTE — ED Notes (Signed)
Sevral attempts made with straight stick and starting second line for blood cultures without success

## 2022-12-29 NOTE — Progress Notes (Signed)
PHARMACY - ANTICOAGULATION CONSULT NOTE  Pharmacy Consult for Heparin Indication: PTA warfarin on hold, Hx Afib and DVT  Allergies  Allergen Reactions   Betadine [Povidone Iodine] Other (See Comments)    Unknown reaction per caregiver   Ciprofloxacin Other (See Comments)    Unknown reaction per caregiver   Fentanyl Other (See Comments)    Previous reaction resulting in intubation, unclear details per family   Morphine Other (See Comments)    Unknown reaction per caregiver    Patient Measurements: Height: 5\' 5"  (165.1 cm) IBW/kg (Calculated) : 61.5 Last documented ht 65 inch and wt 77 kg (02/04/22) Heparin Dosing Weight:   Vital Signs: Temp: 98.1 F (36.7 C) (12/01 0243) BP: 118/66 (12/01 0400) Pulse Rate: 105 (12/01 0400)  Labs: Recent Labs    12/27/22 0911 12/27/22 1041 12/28/22 0544 12/28/22 1026 12/28/22 2253  HGB 15.3 16.0 13.4  --  13.9  HCT 47.3 47.0 40.1  --  42.7  PLT 212  --  177  --  196  APTT 28  --   --   --   --   LABPROT 19.1*  --  22.4*  --  20.9*  INR 1.6*  --  1.9*  --  1.8*  HEPARINUNFRC  --   --  0.37 0.46  --   CREATININE 0.91 1.00  --  0.88 0.94    Estimated Creatinine Clearance: 96.2 mL/min (by C-G formula based on SCr of 0.94 mg/dL).   Medical History: Past Medical History:  Diagnosis Date   Cerebral palsy (HCC)    Chronic constipation    Coagulopathy (HCC)    DVT (deep venous thrombosis) (HCC)    Dysrhythmia    Hypertension    Insomnia    Pneumonia    Recurrent UTI    Tachycardia     Prior to Admission Medications:   warfarin (COUMADIN) 3 MG tablet Take 3 mg by mouth daily at 6 PM.    12/26/2022   Infusions:   lactated ringers     piperacillin-tazobactam (ZOSYN)  IV     vancomycin 1,000 mg (12/29/22 0459)    Assessment: 42 yoM presents to ED on 11/29 with distended abdomen, vomiting, possible partial bowel obstruction vs ileus d/t constipation.  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  Warfarin was held at admission d/t NPO, and pharmacy is consulted to dose Heparin IV as INR 1.8 is subtherapeutic.  Follow up procedural plans.    CBC: Hgb 13.9, plts 196 INR 1.8  Goal of Therapy:  INR 2-3 Heparin level 0.3-0.7 units/ml Monitor platelets by anticoagulation protocol: Yes   Plan:  Give heparin 4000 units bolus IV x 1 Start heparin IV infusion at 1000 units/hr Heparin level 6 hours after starting Daily heparin level and CBC Continue to monitor H&H and platelets    Arley Phenix RPh 12/29/2022, 5:39 AM

## 2022-12-29 NOTE — Progress Notes (Signed)
PHARMACY - ANTICOAGULATION CONSULT NOTE  Pharmacy Consult for Heparin Indication: PTA warfarin on hold, Hx Afib and DVT  Allergies  Allergen Reactions   Betadine [Povidone Iodine] Other (See Comments)    Unknown reaction per caregiver   Ciprofloxacin Other (See Comments)    Unknown reaction per caregiver   Fentanyl Other (See Comments)    Previous reaction resulting in intubation, unclear details per family   Morphine Other (See Comments)    Unknown reaction per caregiver    Patient Measurements: Height: 5\' 5"  (165.1 cm) Weight: 75.5 kg IBW/kg (Calculated) : 61.5 Heparin Dosing Weight: total body weight   Vital Signs: Temp: 99.3 F (37.4 C) (12/01 0722) BP: 110/54 (12/01 1700) Pulse Rate: 103 (12/01 1700)  Labs: Recent Labs    12/27/22 0911 12/27/22 1041 12/28/22 0544 12/28/22 1026 12/28/22 2253 12/29/22 1522 12/29/22 1755  HGB 15.3   < > 13.4  --  13.9 12.8*  --   HCT 47.3   < > 40.1  --  42.7 40.0  --   PLT 212  --  177  --  196 162  --   APTT 28  --   --   --   --   --   --   LABPROT 19.1*  --  22.4*  --  20.9*  --   --   INR 1.6*  --  1.9*  --  1.8*  --   --   HEPARINUNFRC  --    < > 0.37 0.46  --  0.12* 0.20*  CREATININE 0.91   < >  --  0.88 0.94 0.90  --    < > = values in this interval not displayed.    Estimated Creatinine Clearance: 100.4 mL/min (by C-G formula based on SCr of 0.9 mg/dL).   Medical History: Past Medical History:  Diagnosis Date   Cerebral palsy (HCC)    Chronic constipation    Coagulopathy (HCC)    DVT (deep venous thrombosis) (HCC)    Dysrhythmia    Hypertension    Insomnia    Pneumonia    Recurrent UTI    Tachycardia     Assessment: 53 yoM presents to ED on 11/29 with distended abdomen, vomiting, possible partial bowel obstruction vs ileus d/t constipation. 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 Warfarin was held at admission d/t NPO in case surgical intervention needed.  Pharmacy is consulted to dose IV heparin. INR is subtherapeutic at 1.8.    Today, 12/29/22: 1522 heparin level low at 0.12 units/mL, but concern there may have been issues with IV site/pauses in heparin therapy earlier today per discussion with RN Per RN, heparin has been running without issue since ~ 12 noon when patient arrived to ICU Repeat 1755 heparin level = 0.2 units/mL, subtherapeutic CBC: Hgb 12.8, Plt WNL No bleeding issues per nursing  Goal of Therapy:  Heparin level 0.3-0.7 units/ml Monitor platelets by anticoagulation protocol: Yes   Plan:  Heparin 1150 units IV bolus x 1, then increase heparin infusion to 1150 units/hr Heparin level 6 hours after rate change Daily CBC, heparin level  Monitor closely for s/sx of bleeding   Greer Pickerel, PharmD, BCPS Clinical Pharmacist 12/29/2022, 6:54 PM

## 2022-12-29 NOTE — Consult Note (Addendum)
Consultation  Referring Provider:     [facility] Primary Care Physician:  Fleet Contras, MD Primary Gastroenterologist:        Luciano Cutter 1 time 2011 Reason for Consultation:     "Chronic adynamic ileus "     Impression / Plan:   This 42 year old white man with spastic cerebral palsy, chronic constipation, history of SBO and LOA 2019, warfarin therapy for DVT, who was admitted 12/27/2022 with question of SBO because he had some vomiting, that quickly resolved and he was discharged and then readmitted early this morning after presenting back with abdominal pain.  Surgery saw him and did not think the clinical scenario was compatible with any type of ischemia or infarct in the gut and signed off and recommended GI management of chronic adynamic ileus.  We are consulted subsequently.   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  This is managed with Metamucil.  I do not think there were any additional ileus management recommendations that I have.  I am not sure what led to his 2 recent admissions but it does not sound like an SBO or ileus was responsible.  The pain he had that led to a return was a 2 out of a 10 per his caregiver.  He feels better now.  Would leave NG tube in and if doing well in the morning we can discontinue and to try to let him eat again.  I do not have any other recommendations.  We will follow-up and see what transpires.  My sense is it is not worth pursuing any type of endoscopic evaluation given the possibility that the question of pneumatosis in the gastric fundus is just a question and he looks well at this time.  Question if celiac artery  stenosis due to median arcuate ligament compression is causing problems.  Gastroenterology has nothing to offer for that and based upon current situation and his comorbidities I do not think it makes sense to consult a vascular surgeon.  Continue to cover with pantoprazole.  I think the antibiotics can be stopped very soon unless something changes.  Iva Boop, MD, [facility] See Loretha Stapler on call - gastroenterology for best contact person 12/29/2022 3:10 PM         HPI:   [name] is a 42 y.o. male with cerebral palsy and chronic constipation and a chronically dilated colon and history of SBO with lysis of adhesions in 2019.  He was briefly admitted 11 29-11 30 with a question of SBO versus ileus because he had vomiting.  He rapidly improved and was discharged.  The night of discharge she was having some upper abdominal pain and was brought back to the hospital where imaging studies were performed and demonstrated the findings outlined above and listed in the results section below.  He feels well at this point and NG tube has been inserted twice it was pulled out inadvertently once, it is hard to know how much fluid is coming out at this point.  He would like the tube out.  I called and spoke to his caregiver who lives with him, Apolinar Junes, who gave me details.  The patient has  had multiple bowel movements today and had 1 at the time I interviewed and examined him.  Soft brown stool.  Apolinar Junes says they manage his constipation issues with Metamucil and he does not have periods of constipation.  Dr. Donell Beers rounded on him this morning and did not think there was anything surgical and nothing serious with respect to the gut and signed off and recommended that we see the patient.  Lactate was 3.7 on 1129 and then rapidly went to 1.9 and then is 1.0 today  Past Medical History:  Diagnosis Date   Cerebral palsy (HCC)    Chronic constipation    Coagulopathy (HCC)    DVT (deep  venous thrombosis) (HCC)    Dysrhythmia    Hypertension    Insomnia    Pneumonia    Recurrent UTI    Tachycardia     Past Surgical History:  Procedure Laterality Date   ELBOW SURGERY  09/12/2010   left   HERNIA REPAIR     INCISE AND DRAIN ABCESS  09/11/2010   LAPAROTOMY N/A 12/15/2017   Procedure: EXPLORATORY LAPAROTOMY;  Surgeon: Rodman Pickle, MD;  Location: [facility] OR;  Service: General;  Laterality: N/A;   SMALL INTESTINE SURGERY     TOOTH EXTRACTION  11/22/2011   Procedure: DENTAL RESTORATION/EXTRACTIONS;  Surgeon: Winfield Rast, MD;  Location: [facility] OR;  Service: Dentistry;  Laterality: N/A;  dental restoration and cleaning; NO EXTRACTIONS   TRANSURETHRAL RESECTION OF BLADDER TUMOR Bilateral 02/04/2022   Procedure: CYSTOSCOPY BILATERAL RETROGRADE PYELOGRAM, LEFT DIAGNOSTIC URETEROSCOPY;  Surgeon: Crista Elliot, MD;  Location: [facility] ORS;  Service: Urology;  Laterality: Bilateral;  1 HR FOR CASE    Family History  Family history unknown: Yes    Social History   Tobacco Use   Smoking status: Never   Smokeless tobacco: Never  Vaping Use   Vaping status: Never Used  Substance Use Topics   Alcohol use: No   Drug use: No    Prior to Admission medications   Medication Sig Start Date End Date Taking? Authorizing Provider  acetaminophen (TYLENOL) 325 MG tablet Take 650 mg by mouth every 6 (six) hours as needed for mild pain or moderate pain. For pain   Yes [provider]  baclofen (LIORESAL) 20 MG tablet Take 1 tablet (20 mg total) by mouth 4 (four) times daily. 05/27/19  Yes Levert Feinstein, MD  calcium carbonate (TUMS - DOSED IN MG ELEMENTAL CALCIUM) 500 MG chewable tablet Chew 1 tablet by mouth 3 (three) times daily.   Yes [provider]  cholecalciferol (VITAMIN D) 1000 UNITS tablet Take 1,000 Units by mouth 2 (two) times daily.   Yes [provider]  cyclobenzaprine (FLEXERIL) 10 MG tablet 1 at 8am and 12pm and 2 tabs at 8pm. Patient taking  differently: Take 10-20 mg by mouth See admin instructions. 1 at 8am and 12pm and 2 tabs at 8pm. 05/27/19  Yes Levert Feinstein, MD  diazepam (VALIUM) 2 MG tablet Take 1 tablet (2 mg total) by mouth 2 (two) times daily. Must last 30 days. 05/27/19  Yes Levert Feinstein, MD  senna-docusate (SENOKOT-S) 8.6-50 MG tablet Take 3 tablets by mouth at bedtime.   Yes [provider]  tiZANidine (ZANAFLEX) 2 MG tablet TAKE 1 TABLET BY MOUTH TWICE A DAY;TAKE 2 TABLETS (4MG ) BY MOUTH AT 12PM (DAY PROGRAM) Patient taking differently: Take 2-4 mg by mouth See admin instructions. TAKE 1 TABLET BY MOUTH TWICE A DAY;TAKE 2 TABLETS (4MG ) BY MOUTH  AT 12PM (DAY PROGRAM) 05/03/20  Yes Levert Feinstein, MD  warfarin (COUMADIN) 3 MG tablet Take 3 mg by mouth daily at 6 PM.    Yes [provider]  cetirizine (ZYRTEC) 10 MG tablet Take 10 mg by mouth daily.    [provider]  dextromethorphan (DELSYM) 30 MG/5ML liquid Take 30 mg by mouth 2 (two) times daily as needed for cough.    [provider]  diphenhydrAMINE (BENADRYL) 25 MG tablet Take 25 mg by mouth every 6 (six) hours as needed for allergies.    [provider]  furosemide (LASIX) 20 MG tablet Take 20 mg by mouth daily as needed for fluid (leg edema).    [provider]  glycerin adult 2 g suppository Place 1 suppository rectally as needed for constipation.    [provider]  hydrochlorothiazide (HYDRODIURIL) 25 MG tablet Take 12.5 mg by mouth daily.     [provider]  metoprolol succinate (TOPROL-XL) 50 MG 24 hr tablet Take 1 tablet (50 mg total) by mouth daily. 02/26/19   Sherrie George, PA-C  Multiple Vitamin (MULITIVITAMIN WITH MINERALS) TABS Take 1 tablet by mouth daily.    [provider]  nitrofurantoin (MACRODANTIN) 50 MG capsule Take 50 mg by mouth daily. continuous    [provider]  nystatin (MYCOSTATIN/NYSTOP) powder Apply 1 Application topically at bedtime as needed (irritation).  06/21/19   [provider]  ondansetron (ZOFRAN ODT) 4 MG disintegrating tablet Take 1 tablet (4 mg total) by mouth every 8 (eight) hours as needed for nausea or vomiting. 05/10/19   Petrucelli, Lelon Mast R, PA-C  polyethylene glycol powder (GLYCOLAX/MIRALAX) 17 GM/SCOOP powder Take 17 g by mouth daily as needed for mild constipation, moderate constipation or severe constipation. 01/29/18   [provider]  psyllium (HYDROCIL/METAMUCIL) 95 % PACK Give him one dose daily as directed on the package.  This is to help with his constipation.  You can buy this over the counter at any drug store. Patient taking differently: Take 1 packet by mouth daily as needed for mild constipation or moderate constipation. 02/25/19   Sherrie George, PA-C  Sodium Fluoride (SODIUM FLUORIDE 5000 PPM) 1.1 % PSTE Take 1 Application by mouth at bedtime. 10/29/19   [provider]  Stannous Fluoride (GEL-KAM) 0.4 % GEL Place 1 application onto teeth daily.    [provider]  traMADol (ULTRAM) 50 MG tablet Take 100 mg by mouth every 6 (six) hours as needed for moderate pain.    [provider]    Current Facility-Administered Medications  Medication Dose Route Frequency Provider Last Rate Last Admin   acetaminophen (TYLENOL) tablet 650 mg  650 mg Per Tube Q6H PRN Opyd, Lavone Neri, MD       Or   acetaminophen (TYLENOL) suppository 650 mg  650 mg Rectal Q6H PRN Opyd, Lavone Neri, MD       baclofen (LIORESAL) tablet 20 mg  20 mg Per Tube QID Kc, Ramesh, MD   20 mg at 12/29/22 1339   Chlorhexidine Gluconate Cloth 2 % PADS 6 each  6 each Topical Daily Kc, Ramesh, MD   6 each at 12/29/22 1352   cyclobenzaprine (FLEXERIL) tablet 10 mg  10 mg Per Tube BID Kc, Dayna Barker, MD   10 mg at 12/29/22 1338   cyclobenzaprine (FLEXERIL) tablet 20 mg  20 mg Per Tube Q24H Kc, Ramesh, MD       diazepam (VALIUM) injection 2.5 mg  2.5 mg Intravenous Q8H  PRN Briscoe Deutscher, MD   2.5 mg at 12/29/22 0901   diazepam  (VALIUM) tablet 2 mg  2 mg Per Tube BID Lanae Boast, MD   2 mg at 12/29/22 1338   diphenhydrAMINE (BENADRYL) 12.5 MG/5ML elixir 25 mg  25 mg Per Tube Q6H PRN Kc, Dayna Barker, MD       glycerin adult 1 suppository  1 suppository Rectal PRN Kc, Ramesh, MD       heparin ADULT infusion 100 units/mL (25000 units/227mL)  1,000 Units/hr Intravenous Continuous Maurice March, RPH 10 mL/hr at 12/29/22 1400 1,000 Units/hr at 12/29/22 1400   HYDROmorphone (DILAUDID) injection 0.5 mg  0.5 mg Intravenous Q3H PRN Opyd, Lavone Neri, MD       labetalol (NORMODYNE) injection 10 mg  10 mg Intravenous Q2H PRN Opyd, Lavone Neri, MD       lactated ringers infusion   Intravenous Continuous Lanae Boast, MD 125 mL/hr at 12/29/22 1400 Infusion Verify at 12/29/22 1400   metoprolol tartrate (LOPRESSOR) 25 mg/10 mL oral suspension 25 mg  25 mg Per Tube BID Lanae Boast, MD   25 mg at 12/29/22 1441   metoprolol tartrate (LOPRESSOR) injection 2.5 mg  2.5 mg Intravenous Q2H PRN Opyd, Lavone Neri, MD   2.5 mg at 12/29/22 1339   ondansetron (ZOFRAN) tablet 4 mg  4 mg Per Tube Q6H PRN Opyd, Lavone Neri, MD       Or   ondansetron (ZOFRAN) injection 4 mg  4 mg Intravenous Q6H PRN Opyd, Lavone Neri, MD       Oral care mouth rinse  15 mL Mouth Rinse PRN Kc, Ramesh, MD       pantoprazole (PROTONIX) injection 40 mg  40 mg Intravenous Q24H Opyd, Lavone Neri, MD   40 mg at 12/29/22 1308   piperacillin-tazobactam (ZOSYN) IVPB 3.375 g  3.375 g Intravenous Q8H Opyd, Lavone Neri, MD 12.5 mL/hr at 12/29/22 1400 Infusion Verify at 12/29/22 1400   senna-docusate (Senokot-S) tablet 3 tablet  3 tablet Per Tube QHS Kc, Ramesh, MD       sodium chloride 0.9 % bolus 500 mL  500 mL Intravenous Once Kc, Ramesh, MD       sodium chloride flush (NS) 0.9 % injection 3 mL  3 mL Intravenous Q12H Opyd, Lavone Neri, MD   3 mL at 12/29/22 0901   tiZANidine (ZANAFLEX) tablet 2 mg  2 mg Per Tube BID Kc, Ramesh, MD       tiZANidine (ZANAFLEX) tablet 4 mg  4 mg Per Tube Q1200 Kc, Ramesh,  MD   4 mg at 12/29/22 1339   traMADol (ULTRAM) tablet 100 mg  100 mg Per Tube Q6H PRN Lanae Boast, MD        Allergies as of 12/28/2022 - Review Complete 12/28/2022  Allergen Reaction Noted   Betadine [povidone iodine] Other (See Comments) 10/16/2010   Ciprofloxacin Other (See Comments)    Fentanyl Other (See Comments) 02/16/2019   Morphine Other (See Comments)      Review of Systems:    This is positive for those things mentioned in the HPI, he is dependent on a live-in caregiver, he has cerebral palsy and mobility issues related.. All other review of systems are negative.       Physical Exam:  Vital signs in last 24 hours: Temp:  [97.6 F (36.4 C)-99.3 F (37.4 C)] 99.3 F (37.4 C) (12/01 0722) Pulse Rate:  [69-131] 69 (12/01 1441) Resp:  [12-29] 28 (12/01 1400) BP: (  116-135)/(52-75) 116/52 (12/01 1441) SpO2:  [91 %-99 %] 97 % (12/01 1400)    General:  Middle-aged white man with cerebral palsy and contractures associated. Eyes:  anicteric. ENT:   Smallbore NG tube in place draining some brown/bilious fluid. Lungs: Clear to auscultation bilaterally, anterior Heart:   S1S2, no rubs, murmurs, gallops. Abdomen:  soft, non-tender, no hepatosplenomegaly, hernia, or mass and BS+.  It might be a little distended but is very soft and benign.  Bowel sounds are slightly high-pitched. Extremities: Musculoskeletal deformities and contractures    Data Reviewed:   LAB RESULTS: Recent Labs    12/27/22 0911 12/27/22 1041 12/28/22 0544 12/28/22 2253  WBC 9.7  --  5.6 7.4  HGB 15.3 16.0 13.4 13.9  HCT 47.3 47.0 40.1 42.7  PLT 212  --  177 196   BMET Recent Labs    12/27/22 0911 12/27/22 1041 12/28/22 1026 12/28/22 2253  NA 141 142 138 142  K 4.4 4.6 3.9 4.1  CL 104 103 108 108  CO2 27  --  23 26  GLUCOSE 105* 108* 110* 110*  BUN 13 14 9 8   CREATININE 0.91 1.00 0.88 0.94  CALCIUM 8.9  --  7.9* 8.5*   LFT Recent Labs    12/28/22 2253  PROT 6.9  ALBUMIN 3.7   AST 19  ALT 12  ALKPHOS 64  BILITOT 1.2*   PT/INR Recent Labs    12/28/22 0544 12/28/22 2253  LABPROT 22.4* 20.9*  INR 1.9* 1.8*    STUDIES: DG Chest Port 1 View  Result Date: 12/29/2022 CLINICAL DATA:  NG tube placement EXAM: PORTABLE CHEST 1 VIEW COMPARISON:  None Available. FINDINGS: Limited x-ray of the lower chest and upper abdomen demonstrates NG tube with the tip overlying the left upper quadrant, likely in the fundus of the stomach. There are some distended air-filled loops of bowel elsewhere in the upper abdomen. IMPRESSION: Enteric tube in place with tip overlying the fundus of the stomach in the left upper quadrant on this limited portable x-ray Electronically Signed   By: Karen Kays M.D.   On: 12/29/2022 13:36   DG Abdomen 1 View  Result Date: 12/29/2022 CLINICAL DATA:  NG tube placement. EXAM: ABDOMEN - 1 VIEW COMPARISON:  One-view abdomen 12/28/2022 FINDINGS: The side port of the NG tube is just past the GE junction. Bowel gas pattern is normal. IMPRESSION: Side port of the NG tube is just past the GE junction. Electronically Signed   By: Marin Roberts M.D.   On: 12/29/2022 09:10   CT Angio Chest/Abd/Pel for Dissection W and/or W/WO  Result Date: 12/29/2022 CLINICAL DATA:  Abdominal pain with abnormal CT abdomen and pelvis yesterday with portal and mesenteric venous gas. EXAM: CT ANGIOGRAPHY CHEST, ABDOMEN AND PELVIS TECHNIQUE: Noncontrast CT of the chest was initially obtained. Multidetector CT imaging through the chest, abdomen and pelvis was performed using the standard protocol during bolus administration of intravenous contrast. Multiplanar reconstructed images and MIPs were obtained and reviewed to evaluate the vascular anatomy. RADIATION DOSE REDUCTION: This exam was performed according to the departmental dose-optimization program which includes automated exposure control, adjustment of the mA and/or kV according to patient size and/or use of iterative  reconstruction technique. CONTRAST:  OMNIPAQUE IOHEXOL 350 MG/ML SOLN COMPARISON:  Portable chest 12/27/2022, CTA chest 03/09/2010, CT abdomen and pelvis yesterday at 11:26 p.m. with contrast, CT abdomen pelvis 12/27/2022 with contrast, and CT abdomen and pelvis with IV contrast 05/14/2021. FINDINGS: CTA CHEST FINDINGS Cardiovascular:  No arterial embolus is seen through the segmental level. The pulmonary arteries are normal in caliber. There is mild cardiomegaly with a left chamber predominance. No pulmonary venous dilatation is seen. No pericardial effusion. The aorta and great vessels are normal caliber. There is trace calcific plaque of the distal aortic arch. There is no aneurysm, dissection or stenosis. Mediastinum/Nodes: No enlarged mediastinal, hilar, or axillary lymph nodes. Thyroid gland, trachea, and esophagus demonstrate no significant findings. Lungs/Pleura: Trace right pleural effusion. No left pleural fluid. Mild posterior atelectasis both lungs. No consolidation or nodules. No pneumothorax. Diffuse bronchial thickening. Musculoskeletal: Mild-to-moderate thoracic dextroscoliosis. 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 No acute or other significant chest osseous findings. Subareolar gynecomastia. Review of the MIP images confirms the above findings. CTA ABDOMEN AND PELVIS FINDINGS VASCULAR Aorta: Normal. 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 Slight poststenotic ectasia with otherwise normal vessel opacification. No branch vessel occlusion. SMA: Patent without evidence of aneurysm, dissection, vasculitis or significant stenosis. No branch occlusion is seen. Renals: Both are single. Both are unremarkable. No branch occlusion is seen. IMA: Normal. Inflow: Patent without evidence of aneurysm, dissection, vasculitis or significant stenosis. Once again the left common iliac and external iliac arteries are asymmetrically  larger. 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 Again noted is mesenteric venous gas in the left abdomen posterior to the stomach. Review of the MIP images confirms the above findings. NON-VASCULAR Hepatobiliary: There are small scattered hepatic cysts, and again noted portal venous gas. There are stones within a mildly dilated but otherwise unremarkable gallbladder without wall thickening. No biliary dilatation. Pancreas: No abnormality. Spleen: No abnormality. Adrenals/Urinary Tract: Small renal cysts. No follow-up imaging recommended. No adrenal mass. No evidence of obstructive uropathy or stones. Unremarkable bladder. Stomach/Bowel: Stomach is distended with air and fluid. Query wall pneumatosis posteriorly, seen on the recent CT but not the earlier studies. No small bowel dilatation is seen. There is fluid in the colon and colonic dilatation up to 8 cm. There is no visible bowel pneumatosis. 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 This is not associated with wall thickening or inflammatory change. An appendix is not seen. Lymphatic: No adenopathy is evident. Reproductive: Enlarged prostate, 5.6 cm. Other: No free air or fluid.  No incarcerated hernia. Musculoskeletal: No acute or significant osseous findings. Review of the MIP images confirms the above findings. IMPRESSION: 1. No evidence of pulmonary arterial embolus or aortic dissection. 2. Trace right pleural effusion. 3. Diffuse bronchial thickening.  No focal infiltrate. 4. Mild cardiomegaly. 5. 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 No SMA stenosis or branch vessel occlusion.  6. 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 7. 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 8. Mesenteric venous gas in the left abdomen posterior to the stomach. Query gastric wall pneumatosis posteriorly, seen on the recent CT but not the earlier studies. Wall ischemia not excluded. 9. Fluid in the colon and colonic  dilatation up to 8 cm. No visible bowel pneumatosis. 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 10. Cholelithiasis. 11. Enlarged prostate. 12. Subareolar gynecomastia. 13. Aortic atherosclerosis. Aortic Atherosclerosis (ICD10-I70.0). Electronically Signed   By: Almira Bar M.D.   On: 12/29/2022 02:24   CT ABDOMEN PELVIS W CONTRAST  Result Date: 12/29/2022 CLINICAL DATA:  Abdominal pain, acute, nonlocalized hx SBO, worsening pain Pt. BIB [facility] for abdominal pain x1 day. States that the last few hours his pain has been unbearable. Hx of cerebral palsy and SBO. Per EMS pt. Belched and stated that he felt better. EXAM: CT ABDOMEN AND PELVIS WITH CONTRAST TECHNIQUE: Multidetector CT imaging of the abdomen and pelvis was performed using the standard protocol following bolus administration of intravenous contrast. RADIATION DOSE REDUCTION: This exam was performed according to the departmental dose-optimization program which includes automated exposure control, adjustment of the mA and/or kV according to patient size and/or use of iterative reconstruction technique. CONTRAST:  OMNIPAQUE IOHEXOL 300 MG/ML  SOLN COMPARISON:  CT abdomen pelvis 12/27/2022 FINDINGS: Lower chest: No acute abnormality. Hepatobiliary: Subcentimeter hypodensities too small. Otherwise no focal liver abnormality. Calcified gallstone noted within the gallbladder lumen. No  gallbladder wall thickening or pericholecystic fluid. No biliary dilatation. Pancreas: No focal lesion. Normal pancreatic contour. No surrounding inflammatory changes. No main pancreatic ductal dilatation. Spleen: Normal in size without focal abnormality. Adrenals/Urinary Tract: No adrenal nodule bilaterally. Bilateral kidneys enhance symmetrically. Subcentimeter hypodensities are too small to characterize-no further follow-up indicated. No hydronephrosis. No hydroureter. The urinary bladder is unremarkable. Stomach/Bowel: Pneumatosis of the gastric fundus is not fully excluded (11:13). Interval resolution of small bowel dilatation. No evidence of bowel wall thickening. Fluid within the lumen of the colon. Stool noted within the rectum. Appendix appears normal. Vascular/Lymphatic: Interval development of portal venous gas and mesenteric venous gas. 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 No abdominal aorta or iliac aneurysm. No abdominal, pelvic, or inguinal lymphadenopathy. Reproductive: Prostate is enlarged measuring up to 5.8 cm Other: No intraperitoneal free fluid. No intraperitoneal free gas. No organized fluid collection. Musculoskeletal: No abdominal wall hernia or abnormality. Persistent left hip subcutaneus soft tissue edema. No suspicious lytic or blastic osseous lesions. No acute displaced fracture. IMPRESSION: 1. Resolved small and large bowel dilatation with persistent diarrheal state; however, interval development of portal venous and mesenteric venous gas concerning for sequelae of associated ischemia/necrosis. Pneumatosis of the gastric fundus is not fully excluded. Recommend trending of lactate levels. No bowel perforation. 2. Cholelithiasis with no CT evidence of acute cholecystitis. 3. Prostatomegaly. 4.  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 These results were called by telephone at the time of interpretation on 12/29/2022 at 12:24 am to provider [facility] , who verbally acknowledged these results. Electronically Signed   By: Tish Frederickson M.D.   On: 12/29/2022 00:29   DG Abd 1 View  Result Date: 12/28/2022 CLINICAL DATA:  Bowel obstruction EXAM: ABDOMEN - 1 VIEW COMPARISON:  Abdominal CT from yesterday FINDINGS: Diffuse small and especially large bowel gas. The degree of gaseous distention is improved when compared to prior CT scanogram. Cholelithiasis over the right upper quadrant. No  concerning mass effect or gas collection. IMPRESSION: Generalized gaseous distension of is improved. Electronically Signed   By: Tiburcio Pea M.D.   On: 12/28/2022 06:44        Thanks   LOS: 0 days   @[email or web address]  Sena Slate, MD, [facility] @  12/29/2022, 2:58 PM

## 2022-12-29 NOTE — Progress Notes (Signed)
Subjective/Chief Complaint: Patient discharged yesterday but came back to ED with severe abdominal pain again.  He improved some with some belching.  Repeat CT scan last night showed improvement in bowel dilation, but some portal venous gas.  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     Objective: Vital signs in last 24 hours: Temp:  [97.6 F (36.4 C)-99.3 F (37.4 C)] 99.3 F (37.4 C) (12/01 0722) Pulse Rate:  [84-124] 124 (12/01 0900) Resp:  [12-29] 28 (12/01 0900) BP: (107-131)/(59-75) 121/70 (12/01 0900) SpO2:  [95 %-99 %] 95 % (12/01 0900)    Intake/Output from previous day: 11/30 0701 - 12/01 0700 In: 1250.1 [IV Piggyback:1250.1] Out: -  Intake/Output this shift: No intake/output data recorded.  General appearance: alert, no acute distress Resp: breathing comfortably Cardio: regular rate and rhythm GI: soft, nontender, nod distended.  Audible bowel sounds.    Lab Results:  Recent Labs    12/28/22 0544 12/28/22 2253  WBC 5.6 7.4  HGB 13.4 13.9  HCT 40.1 42.7  PLT 177 196   BMET Recent Labs    12/28/22 1026 12/28/22 2253  NA 138 142  K 3.9 4.1  CL 108 108  CO2 23 26  GLUCOSE 110* 110*  BUN 9 8  CREATININE 0.88 0.94  CALCIUM 7.9* 8.5*   PT/INR Recent Labs    12/28/22 0544 12/28/22 2253  LABPROT 22.4* 20.9*  INR 1.9* 1.8*   ABG No results for input(s): "PHART", "HCO3" in the last 72 hours.  Invalid input(s): "PCO2", "PO2"  Studies/Results: DG Abdomen 1 View  Result Date: 12/29/2022 CLINICAL DATA:  NG tube placement. EXAM: ABDOMEN - 1 VIEW COMPARISON:  One-view abdomen 12/28/2022 FINDINGS: The side port of the NG tube is just past the GE junction. Bowel gas pattern is normal. IMPRESSION: Side port of the NG tube is just past the GE junction. Electronically Signed   By: Marin Roberts M.D.   On: 12/29/2022 09:10   CT Angio Chest/Abd/Pel for  Dissection W and/or W/WO  Result Date: 12/29/2022 CLINICAL DATA:  Abdominal pain with abnormal CT abdomen and pelvis yesterday with portal and mesenteric venous gas. EXAM: CT ANGIOGRAPHY CHEST, ABDOMEN AND PELVIS TECHNIQUE: Noncontrast CT of the chest was initially obtained. Multidetector CT imaging through the chest, abdomen and pelvis was performed using the standard protocol during bolus administration of intravenous contrast. Multiplanar reconstructed images and MIPs were obtained and reviewed to evaluate the vascular anatomy. RADIATION DOSE REDUCTION: This exam was performed according to the departmental dose-optimization program which includes automated exposure control, adjustment of the mA and/or kV according to patient size and/or use of iterative reconstruction technique. CONTRAST:  OMNIPAQUE IOHEXOL 350 MG/ML SOLN COMPARISON:  Portable chest 12/27/2022, CTA chest 03/09/2010, CT abdomen and pelvis yesterday at 11:26 p.m. with contrast, CT abdomen pelvis 12/27/2022 with contrast, and CT abdomen and pelvis with IV contrast 05/14/2021. FINDINGS: CTA CHEST FINDINGS Cardiovascular: No arterial embolus is seen through the segmental level. The pulmonary arteries are normal in caliber. There is mild cardiomegaly with a left chamber predominance. No pulmonary venous dilatation is seen. No pericardial effusion. The aorta and great vessels are normal caliber. There is trace calcific plaque of the distal aortic arch. There is no aneurysm, dissection or stenosis. Mediastinum/Nodes: No enlarged mediastinal, hilar, or axillary lymph nodes. Thyroid gland, trachea, and esophagus demonstrate no significant findings. Lungs/Pleura: Trace right pleural effusion. No left pleural fluid. Mild  posterior atelectasis both lungs. No consolidation or nodules. No pneumothorax. Diffuse bronchial thickening. Musculoskeletal: Mild-to-moderate thoracic dextroscoliosis. 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  No acute or other significant chest osseous findings. Subareolar gynecomastia. Review of the MIP images confirms the above findings. CTA ABDOMEN AND PELVIS FINDINGS VASCULAR Aorta: Normal. 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 Slight poststenotic ectasia with otherwise normal vessel opacification. No branch vessel occlusion. SMA: Patent without evidence of aneurysm, dissection, vasculitis or significant stenosis. No branch occlusion is seen. Renals: Both are single. Both are unremarkable. No branch occlusion is seen. IMA: Normal. Inflow: Patent without evidence of aneurysm, dissection, vasculitis or significant stenosis. Once again the left common iliac and external iliac arteries are asymmetrically larger. 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 Again noted is mesenteric venous gas in the left abdomen posterior to the stomach. Review of the MIP images confirms the above findings. NON-VASCULAR Hepatobiliary: There are small scattered hepatic cysts, and again noted portal venous gas. There are stones within a mildly dilated but otherwise unremarkable gallbladder without wall thickening. No biliary dilatation. Pancreas: No abnormality. Spleen: No abnormality. Adrenals/Urinary Tract: Small renal cysts. No follow-up imaging recommended. No adrenal mass. No evidence of obstructive uropathy or stones. Unremarkable bladder. Stomach/Bowel: Stomach is distended with air and fluid. Query wall pneumatosis posteriorly, seen on the recent CT but not the earlier studies. No small bowel dilatation is seen. There is fluid in the colon and colonic dilatation up to 8 cm. There is no visible bowel pneumatosis. 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 This is not associated with wall thickening or inflammatory change. An appendix is not seen. Lymphatic: No adenopathy is evident. Reproductive: Enlarged prostate, 5.6 cm. Other: No free air or fluid.  No incarcerated hernia. Musculoskeletal: No acute or significant osseous findings. Review of the MIP images confirms the above findings. IMPRESSION: 1. No evidence of pulmonary arterial embolus or aortic dissection. 2. Trace right pleural effusion. 3. Diffuse bronchial thickening.  No focal infiltrate. 4. Mild cardiomegaly. 5. 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 No SMA stenosis or branch vessel occlusion. 6. 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 7. 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 8. Mesenteric venous gas in the left abdomen posterior to the stomach. Query gastric wall pneumatosis posteriorly, seen on the recent CT but not the earlier studies. Wall ischemia not excluded. 9. Fluid in the colon and colonic dilatation up to 8 cm. No visible bowel pneumatosis. 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 10. Cholelithiasis. 11. Enlarged prostate. 12. Subareolar gynecomastia. 13. Aortic atherosclerosis. Aortic Atherosclerosis (ICD10-I70.0). Electronically Signed   By: Almira Bar M.D.   On: 12/29/2022 02:24   CT ABDOMEN PELVIS W CONTRAST  Result Date: 12/29/2022 CLINICAL DATA:  Abdominal pain, acute, nonlocalized hx SBO, worsening pain Pt. BIB [facility] for abdominal pain x1 day. States  that the last few hours his pain has been unbearable. Hx of cerebral palsy and SBO. Per EMS pt. Belched and stated  that he felt better. EXAM: CT ABDOMEN AND PELVIS WITH CONTRAST TECHNIQUE: Multidetector CT imaging of the abdomen and pelvis was performed using the standard protocol following bolus administration of intravenous contrast. RADIATION DOSE REDUCTION: This exam was performed according to the departmental dose-optimization program which includes automated exposure control, adjustment of the mA and/or kV according to patient size and/or use of iterative reconstruction technique. CONTRAST:  OMNIPAQUE IOHEXOL 300 MG/ML  SOLN COMPARISON:  CT abdomen pelvis 12/27/2022 FINDINGS: Lower chest: No acute abnormality. Hepatobiliary: Subcentimeter hypodensities too small. Otherwise no focal liver abnormality. Calcified gallstone noted within the gallbladder lumen. No gallbladder wall thickening or pericholecystic fluid. No biliary dilatation. Pancreas: No focal lesion. Normal pancreatic contour. No surrounding inflammatory changes. No main pancreatic ductal dilatation. Spleen: Normal in size without focal abnormality. Adrenals/Urinary Tract: No adrenal nodule bilaterally. Bilateral kidneys enhance symmetrically. Subcentimeter hypodensities are too small to characterize-no further follow-up indicated. No hydronephrosis. No hydroureter. The urinary bladder is unremarkable. Stomach/Bowel: Pneumatosis of the gastric fundus is not fully excluded (11:13). Interval resolution of small bowel dilatation. No evidence of bowel wall thickening. Fluid within the lumen of the colon. Stool noted within the rectum. Appendix appears normal. Vascular/Lymphatic: Interval development of portal venous gas and mesenteric venous gas. 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 No abdominal aorta or iliac aneurysm. No abdominal, pelvic, or inguinal  lymphadenopathy. Reproductive: Prostate is enlarged measuring up to 5.8 cm Other: No intraperitoneal free fluid. No intraperitoneal free gas. No organized fluid collection. Musculoskeletal: No abdominal wall hernia or abnormality. Persistent left hip subcutaneus soft tissue edema. No suspicious lytic or blastic osseous lesions. No acute displaced fracture. IMPRESSION: 1. Resolved small and large bowel dilatation with persistent diarrheal state; however, interval development of portal venous and mesenteric venous gas concerning for sequelae of associated ischemia/necrosis. Pneumatosis of the gastric fundus is not fully excluded. Recommend trending of lactate levels. No bowel perforation. 2. Cholelithiasis with no CT evidence of acute cholecystitis. 3. Prostatomegaly. 4. 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 These results were called by telephone at the time of interpretation on 12/29/2022 at 12:24 am to provider [facility] , who verbally acknowledged these results. Electronically Signed   By: Tish Frederickson M.D.   On: 12/29/2022 00:29   DG Abd 1 View  Result Date: 12/28/2022 CLINICAL DATA:  Bowel obstruction EXAM: ABDOMEN - 1 VIEW COMPARISON:  Abdominal CT from yesterday FINDINGS: Diffuse small and especially large bowel gas. The degree of gaseous distention is improved when compared to prior CT scanogram. Cholelithiasis over the right upper quadrant. No concerning mass effect or gas collection. IMPRESSION: Generalized gaseous distension of is improved. Electronically Signed   By: Tiburcio Pea M.D.   On: 12/28/2022 06:44   CT ABDOMEN PELVIS W CONTRAST  Result Date: 12/27/2022 CLINICAL DATA:  Acute generalized abdominal pain, nausea, vomiting. EXAM: CT ABDOMEN AND PELVIS WITH CONTRAST TECHNIQUE: Multidetector CT  imaging of the abdomen and pelvis was performed using the standard protocol following bolus administration of intravenous contrast. RADIATION DOSE REDUCTION: This exam was performed according to the departmental  dose-optimization program which includes automated exposure control, adjustment of the mA and/or kV according to patient size and/or use of iterative reconstruction technique. CONTRAST:  OMNIPAQUE IOHEXOL 300 MG/ML  SOLN COMPARISON:  May 14, 2021. FINDINGS: Lower chest: No acute abnormality. Hepatobiliary: Cholelithiasis. No biliary dilatation. Stable hepatic cysts are noted. Pancreas: Unremarkable. No pancreatic ductal dilatation or surrounding inflammatory changes. Spleen: Normal in size without focal abnormality. Adrenals/Urinary Tract: Adrenal glands appear normal. No hydronephrosis or renal obstruction is noted. Bilateral renal cysts are noted for which no further follow-up is required. No hydronephrosis or renal obstruction is noted. Urinary bladder is decompressed. Stomach/Bowel: Moderate gastric distention is noted. Evaluation of bowel loops is limited due to motion artifact. Moderate amount of stool seen in sigmoid colon and rectum. Mild colonic dilatation is noted which may represent ileus or potentially distal colonic obstruction. Mildly dilated small bowel loops are noted which may represent ileus. 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 No adenopathy is noted. Reproductive: Mild prostatic enlargement is noted. Other: No abdominal wall hernia or abnormality. No abdominopelvic ascites. Musculoskeletal: No acute or significant osseous findings. IMPRESSION: Limited exam due to patient motion artifact. Moderate amount of stool seen in the sigmoid colon and rectum. Mild colonic dilatation is noted which may represent ileus or potentially distal  colonic obstruction. Mildly dilated small bowel loops are noted as well as moderate gastric distention which may represent ileus. Mild prostatic enlargement. 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 Cholelithiasis. Electronically Signed   By: Lupita Raider M.D.   On: 12/27/2022 13:12    Anti-infectives: Anti-infectives (From admission, onward)    Start     Dose/Rate Route Frequency Ordered Stop   12/29/22 1800  vancomycin (VANCOCIN) IVPB 1000 mg/200 mL premix  Status:  Discontinued        1,000 mg 200 mL/hr over 60 Minutes Intravenous Every 12 hours 12/29/22 0503 12/29/22 0533   12/29/22 1200  piperacillin-tazobactam (ZOSYN) IVPB 3.375 g        3.375 g 12.5 mL/hr over 240 Minutes Intravenous Every 8 hours 12/29/22 0456     12/29/22 0415  vancomycin (VANCOCIN) IVPB 1000 mg/200 mL premix        1,000 mg 200 mL/hr over 60 Minutes Intravenous  Once 12/29/22 0407 12/29/22 0610   12/29/22 0415  piperacillin-tazobactam (ZOSYN) IVPB 3.375 g        3.375 g 100 mL/hr over 30 Minutes Intravenous  Once 12/29/22 0407 12/29/22 0500       Assessment/Plan: H/o Constipation and adynamic ileus  Abdominal pain Abnormal CT with concern for mesenteric venous air- normal lactate, normal WBCs.    No clinical evidence of ischemic bowel No need for surgical intervention Restart chronic constipation regimen. Call for further concerns.    LOS: 0 days    Maudry Diego, MD, FACS, FSS[facility] Surgical Oncology, General Surgery, Trauma and Critical [facility], [place] [phone] for weekday/non holidays Check [email or web address] for coverage night/weekend/holidays

## 2022-12-29 NOTE — H&P (Signed)
History and Physical    [name] WNI:[id number] DOB: 09/07/1980 DOA: 12/28/2022  PCP: Fleet Contras, MD   Patient coming from: Home   Chief Complaint: Abdominal pain   HPI: [name] is a 42 y.o. male with medical history significant for cerebral palsy, chronic constipation, DVT and PAF on warfarin, SBO, and recent admission abdominal pain and N/V suspected secondary to ileus who now returns with recurrent abdominal pain.   Patient was admitted to the hospital 12/27/2022 with abdominal pain and distention, was seen by surgery in consultation, appeared to improve with conservative management, diet was advanced, and he was discharged home yesterday afternoon.  Shortly after this, he developed severe recurrence and abdominal pain which prompted EMS to be called.  Per report of EMS, he experienced improvement in his symptoms after belching.  Patient has a difficult time communicating due to his chronic condition, significantly limiting utility of the interview.  ED Course: Upon arrival to the ED, patient is found to be afebrile and saturating well on room air with mild tachycardia and stable blood pressure.  Labs are most notable for normal renal function, normal WBC, and normal lactic acid.  CT demonstrates resolution of bowel dilatation but concerning for interval development of portal venous and mesenteric venous gas, as well as possible gastric wall pneumatosis.  Surgery (Dr. Donell Beers) was consulted by the ED PA, NG tube was placed, and the patient was given a liter of saline, Dilaudid, Zofran, and Valium.  Review of Systems:  ROS limited by patient's clinical condition.  Past Medical History:  Diagnosis Date   Cerebral palsy (HCC)    Chronic constipation    Coagulopathy (HCC)    DVT (deep venous thrombosis) (HCC)    Dysrhythmia    Hypertension    Insomnia    Pneumonia    Recurrent UTI    Tachycardia     Past Surgical History:  Procedure Laterality Date   ELBOW  SURGERY  09/12/2010   left   HERNIA REPAIR     INCISE AND DRAIN ABCESS  09/11/2010   LAPAROTOMY N/A 12/15/2017   Procedure: EXPLORATORY LAPAROTOMY;  Surgeon: Rodman Pickle, MD;  Location: [facility] OR;  Service: General;  Laterality: N/A;   SMALL INTESTINE SURGERY     TOOTH EXTRACTION  11/22/2011   Procedure: DENTAL RESTORATION/EXTRACTIONS;  Surgeon: Winfield Rast, MD;  Location: [facility] OR;  Service: Dentistry;  Laterality: N/A;  dental restoration and cleaning; NO EXTRACTIONS   TRANSURETHRAL RESECTION OF BLADDER TUMOR Bilateral 02/04/2022   Procedure: CYSTOSCOPY BILATERAL RETROGRADE PYELOGRAM, LEFT DIAGNOSTIC URETEROSCOPY;  Surgeon: Crista Elliot, MD;  Location: [facility] ORS;  Service: Urology;  Laterality: Bilateral;  1 HR FOR CASE    Social History:   reports that he has never smoked. He has never used smokeless tobacco. He reports that he does not drink alcohol and does not use drugs.  Allergies  Allergen Reactions   Betadine [Povidone Iodine] Other (See Comments)    Unknown reaction per caregiver   Ciprofloxacin Other (See Comments)    Unknown reaction per caregiver   Fentanyl Other (See Comments)    Previous reaction resulting in intubation, unclear details per family   Morphine Other (See Comments)    Unknown reaction per caregiver    Family History  Family history unknown: Yes     Prior to Admission medications   Medication Sig Start Date End Date Taking? Authorizing Provider  acetaminophen (TYLENOL) 325 MG tablet Take 650 mg by mouth every 6 (six) hours  as needed for mild pain or moderate pain. For pain    [provider]  baclofen (LIORESAL) 20 MG tablet Take 1 tablet (20 mg total) by mouth 4 (four) times daily. 05/27/19   Levert Feinstein, MD  calcium carbonate (TUMS - DOSED IN MG ELEMENTAL CALCIUM) 500 MG chewable tablet Chew 1 tablet by mouth 3 (three) times daily.    [provider]  cetirizine (ZYRTEC) 10 MG tablet Take 10 mg by mouth daily.    [provider]  cholecalciferol (VITAMIN D) 1000 UNITS tablet Take 1,000 Units by mouth 2 (two) times daily.    [provider]  cyclobenzaprine (FLEXERIL) 10 MG tablet 1 at 8am and 12pm and 2 tabs at 8pm. Patient taking differently: Take 10-20 mg by mouth See admin instructions. 1 at 8am and 12pm and 2 tabs at 8pm. 05/27/19   Levert Feinstein, MD  dextromethorphan (DELSYM) 30 MG/5ML liquid Take 30 mg by mouth 2 (two) times daily as needed for cough.    [provider]  diazepam (VALIUM) 2 MG tablet Take 1 tablet (2 mg total) by mouth 2 (two) times daily. Must last 30 days. 05/27/19   Levert Feinstein, MD  diphenhydrAMINE (BENADRYL) 25 MG tablet Take 25 mg by mouth every 6 (six) hours as needed for allergies.    [provider]  furosemide (LASIX) 20 MG tablet Take 20 mg by mouth daily as needed for fluid (leg edema).    [provider]  glycerin adult 2 g suppository Place 1 suppository rectally as needed for constipation.    [provider]  hydrochlorothiazide (HYDRODIURIL) 25 MG tablet Take 12.5 mg by mouth daily.     [provider]  metoprolol succinate (TOPROL-XL) 50 MG 24 hr tablet Take 1 tablet (50 mg total) by mouth daily. 02/26/19   Sherrie George, PA-C  Multiple Vitamin (MULITIVITAMIN WITH MINERALS) TABS Take 1 tablet by mouth daily.    [provider]  nitrofurantoin (MACRODANTIN) 50 MG capsule Take 50 mg by mouth daily. continuous    [provider]  nystatin (MYCOSTATIN/NYSTOP) powder Apply 1 Application topically at bedtime as needed (irritation). 06/21/19   [provider]  ondansetron (ZOFRAN ODT) 4 MG disintegrating tablet Take 1 tablet (4 mg total) by mouth every 8 (eight) hours as needed for nausea or vomiting. 05/10/19   Petrucelli, Lelon Mast R, PA-C  polyethylene glycol powder (GLYCOLAX/MIRALAX) 17 GM/SCOOP powder Take 17 g by mouth daily as needed for mild constipation, moderate constipation or severe  constipation. 01/29/18   [provider]  psyllium (HYDROCIL/METAMUCIL) 95 % PACK Give him one dose daily as directed on the package.  This is to help with his constipation.  You can buy this over the counter at any drug store. Patient taking differently: Take 1 packet by mouth daily as needed for mild constipation or moderate constipation. 02/25/19   Sherrie George, PA-C  senna-docusate (SENOKOT-S) 8.6-50 MG tablet Take 3 tablets by mouth at bedtime.    [provider]  Sodium Fluoride (SODIUM FLUORIDE 5000 PPM) 1.1 % PSTE Take 1 Application by mouth at bedtime. 10/29/19   [provider]  Stannous Fluoride (GEL-KAM) 0.4 % GEL Place 1 application onto teeth daily.    [provider]  tiZANidine (ZANAFLEX) 2 MG tablet TAKE 1 TABLET BY MOUTH TWICE A DAY;TAKE 2 TABLETS (4MG ) BY MOUTH AT 12PM (DAY PROGRAM) Patient taking differently: Take 2-4 mg by mouth See admin instructions. TAKE 1 TABLET BY MOUTH TWICE A  DAY;TAKE 2 TABLETS (4MG ) BY MOUTH AT 12PM (DAY PROGRAM) 05/03/20   Levert Feinstein, MD  traMADol (ULTRAM) 50 MG tablet Take 100 mg by mouth every 6 (six) hours as needed for moderate pain.    [provider]  warfarin (COUMADIN) 3 MG tablet Take 3 mg by mouth daily at 6 PM.     [provider]    Physical Exam: Vitals:   12/29/22 0200 12/29/22 0243 12/29/22 0400 12/29/22 0414  BP: 127/75  118/66   Pulse: (!) 102  (!) 105   Resp: 14  12   Temp:  98.1 F (36.7 C)    SpO2: 99%  99%   Height:    5\' 5"  (1.651 m)    Constitutional: NAD, no diaphoresis   Eyes: PERTLA, lids and conjunctivae normal ENMT: Mucous membranes are dry. Posterior pharynx clear of any exudate or lesions.   Neck: supple, no masses  Respiratory: no wheezing, no crackles. No accessory muscle use.  Cardiovascular: S1 & S2 heard, regular rate and rhythm. No extremity edema.  Abdomen: Distended, soft, no guarding. Bowel sounds active.  Musculoskeletal: no clubbing / cyanosis. No  joint deformity upper and lower extremities.   Skin: no significant rashes, lesions, ulcers. Warm, dry, well-perfused. Neurologic: No gross facial asymmetry. Sensation intact. Moving all extremities. Alert and oriented.  Psychiatric: Calm. Cooperative.    Labs and Imaging on Admission: I have personally reviewed following labs and imaging studies  CBC: Recent Labs  Lab 12/27/22 0911 12/27/22 1041 12/28/22 0544 12/28/22 2253  WBC 9.7  --  5.6 7.4  NEUTROABS 7.9*  --   --   --   HGB 15.3 16.0 13.4 13.9  HCT 47.3 47.0 40.1 42.7  MCV 94.8  --  92.6 94.1  PLT 212  --  177 196   Basic Metabolic Panel: Recent Labs  Lab 12/27/22 0911 12/27/22 1041 12/28/22 1026 12/28/22 2253  NA 141 142 138 142  K 4.4 4.6 3.9 4.1  CL 104 103 108 108  CO2 27  --  23 26  GLUCOSE 105* 108* 110* 110*  BUN 13 14 9 8   CREATININE 0.91 1.00 0.88 0.94  CALCIUM 8.9  --  7.9* 8.5*  MG  --   --  1.7  --    GFR: Estimated Creatinine Clearance: 96.2 mL/min (by C-G formula based on SCr of 0.94 mg/dL). Liver Function Tests: Recent Labs  Lab 12/27/22 0911 12/28/22 1026 12/28/22 2253  AST 21 18 19   ALT 15 10 12   ALKPHOS 62 51 64  BILITOT 1.3* 1.0 1.2*  PROT 7.1 5.8* 6.9  ALBUMIN 3.9 3.2* 3.7   Recent Labs  Lab 12/27/22 0911 12/28/22 2253  LIPASE 26 27   No results for input(s): "AMMONIA" in the last 168 hours. Coagulation Profile: Recent Labs  Lab 12/27/22 0911 12/28/22 0544 12/28/22 2253  INR 1.6* 1.9* 1.8*   Cardiac Enzymes: No results for input(s): "CKTOTAL", "CKMB", "CKMBINDEX", "TROPONINI" in the last 168 hours. BNP (last 3 results) No results for input(s): "PROBNP" in the last 8760 hours. HbA1C: No results for input(s): "HGBA1C" in the last 72 hours. CBG: Recent Labs  Lab 12/28/22 0743  GLUCAP 102*   Lipid Profile: No results for input(s): "CHOL", "HDL", "LDLCALC", "TRIG", "CHOLHDL", "LDLDIRECT" in the last 72 hours. Thyroid Function Tests: No results for input(s):  "TSH", "T4TOTAL", "FREET4", "T3FREE", "THYROIDAB" in the last 72 hours. Anemia Panel: No results for input(s): "VITAMINB12", "FOLATE", "FERRITIN", "TIBC", "IRON", "RETICCTPCT" in the last 72  hours. Urine analysis:    Component Value Date/Time   COLORURINE YELLOW 12/29/2022 0356   APPEARANCEUR CLEAR 12/29/2022 0356   LABSPEC >1.046 (H) 12/29/2022 0356   PHURINE 5.0 12/29/2022 0356   GLUCOSEU NEGATIVE 12/29/2022 0356   HGBUR NEGATIVE 12/29/2022 0356   BILIRUBINUR NEGATIVE 12/29/2022 0356   BILIRUBINUR negative 05/11/2015 1041   KETONESUR 20 (A) 12/29/2022 0356   PROTEINUR NEGATIVE 12/29/2022 0356   UROBILINOGEN 1.0 05/11/2015 1041   UROBILINOGEN 0.2 06/03/2011 1809   NITRITE NEGATIVE 12/29/2022 0356   LEUKOCYTESUR NEGATIVE 12/29/2022 0356   Sepsis Labs: @LABRCNTIP (procalcitonin:4,lacticidven:4) ) Recent Results (from the past 240 hour(s))  Resp panel by RT-PCR (RSV, Flu A&B, Covid) Anterior Nasal Swab     Status: None   Collection Time: 12/27/22  9:39 AM   Specimen: Anterior Nasal Swab  Result Value Ref Range Status   SARS Coronavirus 2 by RT PCR NEGATIVE NEGATIVE Final    Comment: (NOTE) SARS-CoV-2 target nucleic acids are NOT DETECTED.  The SARS-CoV-2 RNA is generally detectable in upper respiratory specimens during the acute phase of infection. The lowest concentration of SARS-CoV-2 viral copies this assay can detect is 138 copies/mL. A negative result does not preclude SARS-Cov-2 infection and should not be used as the sole basis for treatment or other patient management decisions. A negative result may occur with  improper specimen collection/handling, submission of specimen other than nasopharyngeal swab, presence of viral mutation(s) within the areas targeted by this assay, and inadequate number of viral copies(<138 copies/mL). A negative result must be combined with clinical observations, patient history, and epidemiological information. The expected result is  Negative.  Fact Sheet for Patients:  BloggerCourse.com  Fact Sheet for Healthcare Providers:  SeriousBroker.it  This test is no t yet approved or cleared by the [place] FDA and  has been authorized for detection and/or diagnosis of SARS-CoV-2 by FDA under an Emergency Use Authorization (EUA). This EUA will remain  in effect (meaning this test can be used) for the duration of the COVID-19 declaration under Section 564(b)(1) of the Act, 21 U.S.C.section 360bbb-3(b)(1), unless the authorization is terminated  or revoked sooner.       Influenza A by PCR NEGATIVE NEGATIVE Final   Influenza B by PCR NEGATIVE NEGATIVE Final    Comment: (NOTE) The Xpert Xpress SARS-CoV-2/FLU/RSV plus assay is intended as an aid in the diagnosis of influenza from Nasopharyngeal swab specimens and should not be used as a sole basis for treatment. Nasal washings and aspirates are unacceptable for Xpert Xpress SARS-CoV-2/FLU/RSV testing.  Fact Sheet for Patients: BloggerCourse.com  Fact Sheet for Healthcare Providers: SeriousBroker.it  This test is not yet approved or cleared by the [place] FDA and has been authorized for detection and/or diagnosis of SARS-CoV-2 by FDA under an Emergency Use Authorization (EUA). This EUA will remain in effect (meaning this test can be used) for the duration of the COVID-19 declaration under Section 564(b)(1) of the Act, 21 U.S.C. section 360bbb-3(b)(1), unless the authorization is terminated or revoked.     Resp Syncytial Virus by PCR NEGATIVE NEGATIVE Final    Comment: (NOTE) Fact Sheet for Patients: BloggerCourse.com  Fact Sheet for Healthcare Providers: SeriousBroker.it  This test is not yet approved or cleared by the [place] FDA and has been authorized for detection and/or diagnosis of  SARS-CoV-2 by FDA under an Emergency Use Authorization (EUA). This EUA will remain in effect (meaning this test can be used) for the duration of the COVID-19 declaration under Section 564(b)(1)  of the Act, 21 U.S.C. section 360bbb-3(b)(1), unless the authorization is terminated or revoked.  Performed at [facility], [address] [place], [place] [address]   Blood Culture (routine x 2)     Status: None (Preliminary result)   Collection Time: 12/27/22 10:35 AM   Specimen: BLOOD  Result Value Ref Range Status   Specimen Description   Final    BLOOD SITE NOT SPECIFIED Performed at [facility], [address], [place], [place] [address]    Special Requests   Final    BOTTLES DRAWN AEROBIC AND ANAEROBIC Blood Culture results may not be optimal due to an inadequate volume of blood received in culture bottles Performed at [facility], [address] [place], [place] [address]    Culture   Final    NO GROWTH < 24 HOURS Performed at [facility], [address], [place], [place] [address]    Report Status PENDING  Incomplete  Blood Culture (routine x 2)     Status: None (Preliminary result)   Collection Time: 12/27/22  7:15 PM   Specimen: BLOOD  Result Value Ref Range Status   Specimen Description   Final    BLOOD BLOOD LEFT HAND Performed at [facility], [address] [place], [place] [address]    Special Requests   Final    BOTTLES DRAWN AEROBIC AND ANAEROBIC Blood Culture adequate volume Performed at [facility], [address], [place], [place] [address]    Culture   Final    NO GROWTH < 12 HOURS Performed at [facility], [address], [place], [place] [address]    Report Status PENDING  Incomplete     Radiological Exams on Admission: CT Angio Chest/Abd/Pel for Dissection W and/or W/WO  Result Date: 12/29/2022 CLINICAL DATA:  Abdominal pain with abnormal  CT abdomen and pelvis yesterday with portal and mesenteric venous gas. EXAM: CT ANGIOGRAPHY CHEST, ABDOMEN AND PELVIS TECHNIQUE: Noncontrast CT of the chest was initially obtained. Multidetector CT imaging through the chest, abdomen and pelvis was performed using the standard protocol during bolus administration of intravenous contrast. Multiplanar reconstructed images and MIPs were obtained and reviewed to evaluate the vascular anatomy. RADIATION DOSE REDUCTION: This exam was performed according to the departmental dose-optimization program which includes automated exposure control, adjustment of the mA and/or kV according to patient size and/or use of iterative reconstruction technique. CONTRAST:  OMNIPAQUE IOHEXOL 350 MG/ML SOLN COMPARISON:  Portable chest 12/27/2022, CTA chest 03/09/2010, CT abdomen and pelvis yesterday at 11:26 p.m. with contrast, CT abdomen pelvis 12/27/2022 with contrast, and CT abdomen and pelvis with IV contrast 05/14/2021. FINDINGS: CTA CHEST FINDINGS Cardiovascular: No arterial embolus is seen through the segmental level. The pulmonary arteries are normal in caliber. There is mild cardiomegaly with a left chamber predominance. No pulmonary venous dilatation is seen. No pericardial effusion. The aorta and great vessels are normal caliber. There is trace calcific plaque of the distal aortic arch. There is no aneurysm, dissection or stenosis. Mediastinum/Nodes: No enlarged mediastinal, hilar, or axillary lymph nodes. Thyroid gland, trachea, and esophagus demonstrate no significant findings. Lungs/Pleura: Trace right pleural effusion. No left pleural fluid. Mild posterior atelectasis both lungs. No consolidation or nodules. No pneumothorax. Diffuse bronchial thickening. Musculoskeletal: Mild-to-moderate thoracic dextroscoliosis. 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 No acute or other significant chest osseous findings. Subareolar gynecomastia. Review of the MIP  images confirms the  above findings. CTA ABDOMEN AND PELVIS FINDINGS VASCULAR Aorta: Normal. 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 Slight poststenotic ectasia with otherwise normal vessel opacification. No branch vessel occlusion. SMA: Patent without evidence of aneurysm, dissection, vasculitis or significant stenosis. No branch occlusion is seen. Renals: Both are single. Both are unremarkable. No branch occlusion is seen. IMA: Normal. Inflow: Patent without evidence of aneurysm, dissection, vasculitis or significant stenosis. Once again the left common iliac and external iliac arteries are asymmetrically larger. 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 Again noted is mesenteric venous gas in the left abdomen posterior to the stomach. Review of the MIP images confirms the above findings. NON-VASCULAR Hepatobiliary: There are small scattered hepatic cysts, and again noted portal venous gas. There are stones within a mildly dilated but otherwise unremarkable gallbladder without wall thickening. No biliary dilatation. Pancreas: No abnormality. Spleen: No abnormality. Adrenals/Urinary Tract: Small renal cysts. No follow-up imaging recommended. No adrenal mass. No evidence of obstructive uropathy or stones. Unremarkable bladder. Stomach/Bowel: Stomach is distended with air and fluid. Query wall pneumatosis posteriorly, seen on the recent CT but not the earlier studies. No small bowel dilatation is seen. There is fluid in the colon and colonic dilatation up to 8 cm. There is no visible bowel pneumatosis. 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 This  is not associated with wall thickening or inflammatory change. An appendix is not seen. Lymphatic: No adenopathy is evident. Reproductive: Enlarged prostate, 5.6 cm. Other: No free air or fluid.  No incarcerated hernia. Musculoskeletal: No acute or significant osseous findings. Review of the MIP images confirms the above findings. IMPRESSION: 1. No evidence of pulmonary arterial embolus or aortic dissection. 2. Trace right pleural effusion. 3. Diffuse bronchial thickening.  No focal infiltrate. 4. Mild cardiomegaly. 5. 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 No SMA stenosis or branch vessel occlusion. 6. 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 7. 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 8. Mesenteric venous gas in the left abdomen posterior to the stomach. Query gastric wall pneumatosis posteriorly, seen on the recent CT but not the earlier studies. Wall ischemia not excluded. 9. Fluid in the colon and colonic dilatation up to 8 cm. No visible bowel pneumatosis. 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 10. Cholelithiasis. 11. Enlarged prostate. 12. Subareolar gynecomastia. 13. Aortic atherosclerosis. Aortic Atherosclerosis (ICD10-I70.0). Electronically Signed   By: Almira Bar M.D.   On: 12/29/2022 02:24   CT ABDOMEN PELVIS W CONTRAST  Result Date: 12/29/2022 CLINICAL DATA:  Abdominal pain, acute, nonlocalized hx SBO, worsening pain Pt. BIB [facility] for abdominal pain x1 day. States that the last few hours his pain has been unbearable. Hx of cerebral palsy and SBO. Per EMS pt. Belched and stated that he felt better. EXAM: CT ABDOMEN AND PELVIS WITH CONTRAST TECHNIQUE: Multidetector CT imaging  of the abdomen and pelvis was performed  using the standard protocol following bolus administration of intravenous contrast. RADIATION DOSE REDUCTION: This exam was performed according to the departmental dose-optimization program which includes automated exposure control, adjustment of the mA and/or kV according to patient size and/or use of iterative reconstruction technique. CONTRAST:  OMNIPAQUE IOHEXOL 300 MG/ML  SOLN COMPARISON:  CT abdomen pelvis 12/27/2022 FINDINGS: Lower chest: No acute abnormality. Hepatobiliary: Subcentimeter hypodensities too small. Otherwise no focal liver abnormality. Calcified gallstone noted within the gallbladder lumen. No gallbladder wall thickening or pericholecystic fluid. No biliary dilatation. Pancreas: No focal lesion. Normal pancreatic contour. No surrounding inflammatory changes. No main pancreatic ductal dilatation. Spleen: Normal in size without focal abnormality. Adrenals/Urinary Tract: No adrenal nodule bilaterally. Bilateral kidneys enhance symmetrically. Subcentimeter hypodensities are too small to characterize-no further follow-up indicated. No hydronephrosis. No hydroureter. The urinary bladder is unremarkable. Stomach/Bowel: Pneumatosis of the gastric fundus is not fully excluded (11:13). Interval resolution of small bowel dilatation. No evidence of bowel wall thickening. Fluid within the lumen of the colon. Stool noted within the rectum. Appendix appears normal. Vascular/Lymphatic: Interval development of portal venous gas and mesenteric venous gas. 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 No abdominal aorta or iliac aneurysm. No abdominal, pelvic, or inguinal lymphadenopathy. Reproductive: Prostate is enlarged measuring up to 5.8 cm Other: No intraperitoneal free fluid.  No intraperitoneal free gas. No organized fluid collection. Musculoskeletal: No abdominal wall hernia or abnormality. Persistent left hip subcutaneus soft tissue edema. No suspicious lytic or blastic osseous lesions. No acute displaced fracture. IMPRESSION: 1. Resolved small and large bowel dilatation with persistent diarrheal state; however, interval development of portal venous and mesenteric venous gas concerning for sequelae of associated ischemia/necrosis. Pneumatosis of the gastric fundus is not fully excluded. Recommend trending of lactate levels. No bowel perforation. 2. Cholelithiasis with no CT evidence of acute cholecystitis. 3. Prostatomegaly. 4. 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 These results were called by telephone at the time of interpretation on 12/29/2022 at 12:24 am to provider [facility] , who verbally acknowledged these results. Electronically Signed   By: Tish Frederickson M.D.   On: 12/29/2022 00:29   DG Abd 1 View  Result Date: 12/28/2022 CLINICAL DATA:  Bowel obstruction EXAM: ABDOMEN - 1 VIEW COMPARISON:  Abdominal CT from yesterday FINDINGS: Diffuse small and especially large bowel gas. The degree of gaseous distention is improved when compared to prior CT scanogram. Cholelithiasis over the right upper quadrant. No concerning mass effect or gas collection. IMPRESSION: Generalized gaseous distension of is improved. Electronically Signed   By: Tiburcio Pea M.D.   On: 12/28/2022 06:44   CT ABDOMEN PELVIS W CONTRAST  Result Date: 12/27/2022 CLINICAL DATA:  Acute generalized abdominal pain, nausea, vomiting. EXAM: CT ABDOMEN AND PELVIS WITH CONTRAST TECHNIQUE: Multidetector CT imaging of the abdomen and pelvis was performed using the standard protocol following bolus administration of  intravenous contrast. RADIATION DOSE REDUCTION: This exam was performed according to the departmental dose-optimization program which includes automated exposure control, adjustment of the mA and/or kV according to patient size and/or use of iterative reconstruction technique. CONTRAST:  OMNIPAQUE IOHEXOL 300 MG/ML  SOLN COMPARISON:  May 14, 2021. FINDINGS: Lower chest: No acute abnormality. Hepatobiliary: Cholelithiasis.  No biliary dilatation. Stable hepatic cysts are noted. Pancreas: Unremarkable. No pancreatic ductal dilatation or surrounding inflammatory changes. Spleen: Normal in size without focal abnormality. Adrenals/Urinary Tract: Adrenal glands appear normal. No hydronephrosis or renal obstruction is noted. Bilateral renal cysts are noted for which no further follow-up is required. No hydronephrosis or renal obstruction is noted. Urinary bladder is decompressed. Stomach/Bowel: Moderate gastric distention is noted. Evaluation of bowel loops is limited due to motion artifact. Moderate amount of stool seen in sigmoid colon and rectum. Mild colonic dilatation is noted which may represent ileus or potentially distal colonic obstruction. Mildly dilated small bowel loops are noted which may represent ileus. 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 No adenopathy is noted. Reproductive: Mild prostatic enlargement is noted. Other: No abdominal wall hernia or abnormality. No abdominopelvic ascites. Musculoskeletal: No acute or significant osseous findings. IMPRESSION: Limited exam due to patient motion artifact. Moderate amount of stool seen in the sigmoid colon and rectum. Mild colonic dilatation is noted which may represent ileus or potentially distal colonic obstruction. Mildly dilated small bowel loops are noted as well as moderate gastric distention which may  represent ileus. Mild prostatic enlargement. 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 Cholelithiasis. Electronically Signed   By: Lupita Raider M.D.   On: 12/27/2022 13:12   DG Chest Port 1 View  Result Date: 12/27/2022 CLINICAL DATA:  Abdominal pain, nausea, vomiting and diarrhea. EXAM: PORTABLE CHEST 1 VIEW COMPARISON:  02/16/2019. FINDINGS: Cardiac silhouette is normal in size. No mediastinal or hilar masses. Lung volumes are low. Lungs are clear. No convincing pleural effusion or pneumothorax. Skeletal structures are grossly intact. IMPRESSION: No active disease. Electronically Signed   By: Amie Portland M.D.   On: 12/27/2022 09:48    EKG: Independently reviewed.   Assessment/Plan   1. Abdominal pain; portal and mesenteric venous gas; ?gastric pneumatosis  - He is afebrile on admission, BP is stable, and lactate reassuringly normal  - Continue bowel rest, NGT decompression, pain-control, empiric Zosyn, IVF hydration, and follow-up on surgery recommendations    2. Hx of DVT; PAF  - INR subtherapeutic in ED  - Use IV heparin for now   3. Hypertension  - Treat as-needed only for now   4. Cerebral palsy - Supportive care    DVT prophylaxis: IV heparin  Code Status: Full  Level of Care: Level of care: Progressive Family Communication: None present  Disposition Plan:  Patient is from: home  Anticipated d/c is to: TBD Anticipated d/c date is: 01/01/23  Patient currently: Pending pain-control, surgery consultation, tolerance of adequate oral intake   Consults called: Surgery  Admission status: Inpatient     Briscoe Deutscher, MD [facility]  12/29/2022, 5:33 AM

## 2022-12-30 DIAGNOSIS — K5981 Ogilvie syndrome: Secondary | ICD-10-CM | POA: Diagnosis not present

## 2022-12-30 DIAGNOSIS — K5909 Other constipation: Secondary | ICD-10-CM

## 2022-12-30 DIAGNOSIS — R1084 Generalized abdominal pain: Secondary | ICD-10-CM | POA: Diagnosis not present

## 2022-12-30 LAB — BASIC METABOLIC PANEL
Anion gap: 17 — ABNORMAL HIGH (ref 5–15)
BUN: 10 mg/dL (ref 6–20)
CO2: 15 mmol/L — ABNORMAL LOW (ref 22–32)
Calcium: 7.9 mg/dL — ABNORMAL LOW (ref 8.9–10.3)
Chloride: 108 mmol/L (ref 98–111)
Creatinine, Ser: 0.95 mg/dL (ref 0.61–1.24)
GFR, Estimated: >60 mL/min (ref >60)
Glucose, Bld: 67 mg/dL — ABNORMAL LOW (ref 70–99)
Potassium: 3.6 mmol/L (ref 3.5–5.1)
Sodium: 140 mmol/L (ref 135–145)

## 2022-12-30 LAB — HEPATIC FUNCTION PANEL
ALT: 12 U/L (ref 0–44)
AST: 17 U/L (ref 15–41)
Albumin: 3.2 g/dL — ABNORMAL LOW (ref 3.5–5.0)
Alkaline Phosphatase: 53 U/L (ref 38–126)
Bilirubin, Direct: 0.2 mg/dL (ref 0.0–0.2)
Indirect Bilirubin: 1.1 mg/dL — ABNORMAL HIGH (ref 0.3–0.9)
Total Bilirubin: 1.3 mg/dL — ABNORMAL HIGH (ref <1.2)
Total Protein: 5.7 g/dL — ABNORMAL LOW (ref 6.5–8.1)

## 2022-12-30 LAB — HIV ANTIBODY (ROUTINE TESTING W REFLEX): HIV Screen 4th Generation wRfx: NONREACTIVE

## 2022-12-30 LAB — GLUCOSE, CAPILLARY
Glucose-Capillary: 58 mg/dL — ABNORMAL LOW (ref 70–99)
Glucose-Capillary: 109 mg/dL — ABNORMAL HIGH (ref 70–99)
Glucose-Capillary: 112 mg/dL — ABNORMAL HIGH (ref 70–99)

## 2022-12-30 LAB — CBC
HCT: 41.3 % (ref 39.0–52.0)
Hemoglobin: 12.8 g/dL — ABNORMAL LOW (ref 13.0–17.0)
MCH: 30.4 pg (ref 26.0–34.0)
MCHC: 31 g/dL (ref 30.0–36.0)
MCV: 98.1 fL (ref 80.0–100.0)
Platelets: 185 10*3/uL (ref 150–400)
RBC: 4.21 MIL/uL — ABNORMAL LOW (ref 4.22–5.81)
RDW: 14.7 % (ref 11.5–15.5)
WBC: 7.9 10*3/uL (ref 4.0–10.5)
nRBC: 0 % (ref 0.0–0.2)

## 2022-12-30 LAB — HEPARIN LEVEL (UNFRACTIONATED)
Heparin Unfractionated: 0.4 [IU]/mL (ref 0.30–0.70)
Heparin Unfractionated: 0.21 [IU]/mL — ABNORMAL LOW (ref 0.30–0.70)

## 2022-12-30 LAB — LACTIC ACID, PLASMA: Lactic Acid, Venous: 1.5 mmol/L (ref 0.5–1.9)

## 2022-12-30 MED ORDER — DIAZEPAM 2 MG PO TABS
2.0000 mg | ORAL_TABLET | Freq: Two times a day (BID) | ORAL | Status: DC
  Administered 2022-12-30 – 2023-01-01 (×5): 2 mg via ORAL
  Filled 2022-12-30 (×5): qty 1

## 2022-12-30 MED ORDER — ENOXAPARIN SODIUM 80 MG/0.8ML IJ SOSY
1.0000 mg/kg | PREFILLED_SYRINGE | Freq: Two times a day (BID) | INTRAMUSCULAR | Status: DC
  Administered 2022-12-30 – 2022-12-31 (×4): 75 mg via SUBCUTANEOUS
  Filled 2022-12-30 (×3): qty 0.8

## 2022-12-30 MED ORDER — ONDANSETRON HCL 4 MG/2ML IJ SOLN: 4.0000 mg | Freq: Four times a day (QID) | INTRAMUSCULAR | Status: DC | PRN

## 2022-12-30 MED ORDER — CYCLOBENZAPRINE HCL 10 MG PO TABS
10.0000 mg | ORAL_TABLET | Freq: Two times a day (BID) | ORAL | Status: DC
  Administered 2022-12-31 – 2023-01-02 (×5): 10 mg via ORAL
  Filled 2022-12-30 (×5): qty 1

## 2022-12-30 MED ORDER — SENNOSIDES-DOCUSATE SODIUM 8.6-50 MG PO TABS
3.0000 | ORAL_TABLET | Freq: Every day | ORAL | Status: DC
  Administered 2022-12-30 – 2023-01-01 (×3): 3 via ORAL
  Filled 2022-12-30 (×3): qty 3

## 2022-12-30 MED ORDER — DIPHENHYDRAMINE HCL 12.5 MG/5ML PO ELIX: 25.0000 mg | ORAL_SOLUTION | Freq: Four times a day (QID) | ORAL | Status: DC | PRN

## 2022-12-30 MED ORDER — TAMSULOSIN HCL 0.4 MG PO CAPS
0.4000 mg | ORAL_CAPSULE | Freq: Every day | ORAL | Status: DC
  Administered 2022-12-30 – 2023-01-02 (×4): 0.4 mg via ORAL
  Filled 2022-12-30 (×4): qty 1

## 2022-12-30 MED ORDER — METOPROLOL TARTRATE 25 MG/10 ML ORAL SUSPENSION
25.0000 mg | Freq: Two times a day (BID) | ORAL | Status: DC
  Administered 2022-12-30 – 2023-01-01 (×4): 25 mg via ORAL
  Filled 2022-12-30 (×4): qty 10

## 2022-12-30 MED ORDER — HEPARIN BOLUS VIA INFUSION
1000.0000 [IU] | Freq: Once | INTRAVENOUS | Status: DC
  Filled 2022-12-30: qty 1000

## 2022-12-30 MED ORDER — TIZANIDINE HCL 4 MG PO TABS
4.0000 mg | ORAL_TABLET | Freq: Every day | ORAL | Status: DC
  Administered 2022-12-31 – 2023-01-01 (×2): 4 mg via ORAL
  Filled 2022-12-30 (×2): qty 1

## 2022-12-30 MED ORDER — HEPARIN (PORCINE) 25000 UT/250ML-% IV SOLN: 1300.0000 [IU]/h | INTRAVENOUS | Status: DC

## 2022-12-30 MED ORDER — ONDANSETRON HCL 4 MG PO TABS: 4.0000 mg | ORAL_TABLET | Freq: Four times a day (QID) | ORAL | Status: DC | PRN

## 2022-12-30 MED ORDER — TRAMADOL HCL 50 MG PO TABS
100.0000 mg | ORAL_TABLET | Freq: Four times a day (QID) | ORAL | Status: DC | PRN
  Administered 2022-12-31: 100 mg via ORAL
  Filled 2022-12-30 (×2): qty 2

## 2022-12-30 MED ORDER — TIZANIDINE HCL 4 MG PO TABS
2.0000 mg | ORAL_TABLET | Freq: Two times a day (BID) | ORAL | Status: DC
  Administered 2022-12-30 – 2023-01-02 (×6): 2 mg via ORAL
  Filled 2022-12-30 (×6): qty 1

## 2022-12-30 MED ORDER — ACETAMINOPHEN 650 MG RE SUPP: 650.0000 mg | Freq: Four times a day (QID) | RECTAL | Status: DC | PRN

## 2022-12-30 MED ORDER — BACLOFEN 20 MG PO TABS
20.0000 mg | ORAL_TABLET | Freq: Four times a day (QID) | ORAL | Status: DC
  Administered 2022-12-30 – 2023-01-02 (×10): 20 mg via ORAL
  Filled 2022-12-30 (×2): qty 1
  Filled 2022-12-30: qty 2
  Filled 2022-12-30 (×2): qty 1
  Filled 2022-12-30 (×2): qty 2
  Filled 2022-12-30 (×2): qty 1

## 2022-12-30 MED ORDER — CYCLOBENZAPRINE HCL 10 MG PO TABS
20.0000 mg | ORAL_TABLET | ORAL | Status: DC
  Administered 2022-12-30 – 2023-01-01 (×3): 20 mg via ORAL
  Filled 2022-12-30 (×3): qty 2

## 2022-12-30 MED ORDER — ACETAMINOPHEN 325 MG PO TABS: 650.0000 mg | ORAL_TABLET | Freq: Four times a day (QID) | ORAL | Status: DC | PRN

## 2022-12-30 MED ORDER — DEXTROSE-SODIUM CHLORIDE 5-0.9 % IV SOLN: INTRAVENOUS | Status: AC

## 2022-12-30 NOTE — Evaluation (Signed)
Clinical/Bedside Swallow Evaluation Patient Details  Name: [name] MRN: [id number] Date of Birth: [birth year]  Today's Date: 12/30/2022 Time: SLP Start Time (ACUTE ONLY): 1145 SLP Stop Time (ACUTE ONLY): 1215 SLP Time Calculation (min) (ACUTE ONLY): 30 min  Past Medical History:  Past Medical History:  Diagnosis Date   Cerebral palsy (HCC)    Chronic constipation    Coagulopathy (HCC)    DVT (deep venous thrombosis) (HCC)    Dysrhythmia    Hypertension    Insomnia    Pneumonia    Recurrent UTI    Tachycardia    Past Surgical History:  Past Surgical History:  Procedure Laterality Date   ELBOW SURGERY  09/12/2010   left   HERNIA REPAIR     INCISE AND DRAIN ABCESS  09/11/2010   LAPAROTOMY N/A 12/15/2017   Procedure: EXPLORATORY LAPAROTOMY;  Surgeon: Rodman Pickle, MD;  Location: [facility] OR;  Service: General;  Laterality: N/A;   SMALL INTESTINE SURGERY     TOOTH EXTRACTION  11/22/2011   Procedure: DENTAL RESTORATION/EXTRACTIONS;  Surgeon: Winfield Rast, MD;  Location: [facility] OR;  Service: Dentistry;  Laterality: N/A;  dental restoration and cleaning; NO EXTRACTIONS   TRANSURETHRAL RESECTION OF BLADDER TUMOR Bilateral 02/04/2022   Procedure: CYSTOSCOPY BILATERAL RETROGRADE PYELOGRAM, LEFT DIAGNOSTIC URETEROSCOPY;  Surgeon: Crista Elliot, MD;  Location: [facility] ORS;  Service: Urology;  Laterality: Bilateral;  1 HR FOR CASE   HPI:  Patient is a 42 y.o. 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 He returned to the hospital on 12/1 after developing a severe recurrence and abdominal pain. Per EMS, patient experienced improvement of his symptoms after belching. In ED, he was afebrile, mild tachycardia, stable BP. CT abd/pelvis demonstrated resolution of  bolel dilatation but concerning for interval development of portal venous and mesenteric venous gas as well as possible gastric wall pneumatosis. NG was placed and GI following. NG tube removed 12/2 as recommended by GI and he was started on clear liquids diet. SLP ordered to evaluate swallow function at bedside.    Assessment / Plan / Recommendation  Clinical Impression  Patient seen by SLP for bedside swallow evaluation. He was awake, alert, and agreeable throughout the session. RN had just removed NG tube when SLP entered the room and reported immediate coughing after sips of thin the night before. SLP repositioned patient in bed into [place] position. Patient reported when eating at home, he sits upright in a chair, but he typically prefers to lay flat on his back. Oral cavity examination revealed dried secretions adhered to hard palette. SLP completed a limited oral care, removing most of the dried secretions. Patient was unable to consistently keep mouth open due to involuntary movements and stiffness. SLP administered sips of thin liquid via straw. Although no overt s/sx of aspiration at the time of this evaluation, patient's movements make aspiration a high risk with all POs. Patient with reduced labial seal and suction when using straw. More success when straw was placed in the right side of his mouth. Patient is at a high risk of inadequate nutrition and dehydration given difficulty maintaining safe positioning for PO intake as well as involuntary spastic movements of upper extremities/head before and during swallow, increasing risk of aspiration. Continue thin, clear liquid diet at this time. SLP will follow  up with family/caretaker to determine baseline swallow function prior to this hospitalization and continue to follow for diet toleration monitoring. SLP Visit Diagnosis: Dysphagia, oropharyngeal phase (R13.12)    Aspiration Risk  Moderate aspiration risk;Risk for inadequate  nutrition/hydration    Diet Recommendation Thin liquid;Other (Comment) (clear liquid diet)    Liquid Administration via: Spoon;Straw Medication Administration: Crushed with puree Supervision: Full supervision/cueing for compensatory strategies Compensations: Minimize environmental distractions;Slow rate;Small sips/bites;Other (Comment) (Place straw/spoon on right side of mouth) Postural Changes: Other (Comment) (As upright as tolerable)    Other  Recommendations Oral Care Recommendations: Oral care BID;Staff/trained caregiver to provide oral care    Recommendations for follow up therapy are one component of a multi-disciplinary discharge planning process, led by the attending physician.  Recommendations may be updated based on patient status, additional functional criteria and insurance authorization.  Follow up Recommendations Other (comment) (TBD)      Assistance Recommended at Discharge    Functional Status Assessment Patient has had a recent decline in their functional status and demonstrates the ability to make significant improvements in function in a reasonable and predictable amount of time.  Frequency and Duration min 2x/week  2 weeks       Prognosis Prognosis for improved oropharyngeal function: Fair Barriers to Reach Goals: Time post onset;Severity of deficits      Swallow Study   General Date of Onset: 12/29/22 HPI: Patient is a 42 y.o. 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 He returned to the hospital on 12/1 after developing a severe recurrence and abdominal pain. Per EMS, patient experienced improvement of his symptoms after belching. In ED, he was afebrile, mild tachycardia, stable BP. CT abd/pelvis demonstrated resolution of bolel  dilatation but concerning for interval development of portal venous and mesenteric venous gas as well as possible gastric wall pneumatosis. NG was placed and GI following. NG tube removed 12/2 as recommended by GI and he was started on clear liquids diet. SLP ordered to evaluate swallow function at bedside. Type of Study: Bedside Swallow Evaluation Previous Swallow Assessment: 2019 Bedside swallow evaluation Diet Prior to this Study: Clear liquid diet;Thin liquids (Level 0) Temperature Spikes Noted: No Respiratory Status: Room air History of Recent Intubation: No Behavior/Cognition: Alert;Cooperative;Pleasant mood;Requires cueing Oral Cavity Assessment: Dry;Dried secretions Oral Care Completed by SLP: Yes Oral Cavity - Dentition: Adequate natural dentition Self-Feeding Abilities: Total assist Patient Positioning: Postural control interferes with function Baseline Vocal Quality: Low vocal intensity Volitional Cough: Cognitively unable to elicit Volitional Swallow: Unable to elicit    Oral/Motor/Sensory Function Overall Oral Motor/Sensory Function: Other (comment) (unable to achieve adequate labial seal with straw)   Ice Chips     Thin Liquid Thin Liquid: Impaired Presentation: Straw Oral Phase Impairments: Reduced labial seal;Reduced lingual movement/coordination    Nectar Thick Nectar Thick Liquid: Not tested   Honey Thick Honey Thick Liquid: Not tested   Puree Puree: Not tested   Solid     Solid: Not tested      Marline Backbone, B.S., Speech Therapy Student   12/30/2022,1:36 PM

## 2022-12-30 NOTE — Progress Notes (Addendum)
PHARMACY - ANTICOAGULATION CONSULT NOTE  Pharmacy Consult for Heparin Indication: PTA warfarin on hold, Hx Afib and DVT  Allergies  Allergen Reactions   Betadine [Povidone Iodine] Other (See Comments)    Unknown reaction per caregiver   Ciprofloxacin Other (See Comments)    Unknown reaction per caregiver   Fentanyl Other (See Comments)    Previous reaction resulting in intubation, unclear details per family   Morphine Other (See Comments)    Unknown reaction per caregiver    Patient Measurements: Height: 5\' 5"  (165.1 cm) Weight: 75.5 kg IBW/kg (Calculated) : 61.5 Heparin Dosing Weight: total body weight   Vital Signs: Temp: 98.4 F (36.9 C) (12/02 0012) Temp Source: Oral (12/02 0012) BP: 117/65 (12/02 0200) Pulse Rate: 128 (12/02 0200)  Labs: Recent Labs    12/27/22 0911 12/27/22 1041 12/28/22 0544 12/28/22 1026 12/28/22 2253 12/29/22 1522 12/29/22 1755 12/30/22 0123  HGB 15.3   < > 13.4  --  13.9 12.8*  --  12.8*  HCT 47.3   < > 40.1  --  42.7 40.0  --  41.3  PLT 212  --  177  --  196 162  --  185  APTT 28  --   --   --   --   --   --   --   LABPROT 19.1*  --  22.4*  --  20.9*  --   --   --   INR 1.6*  --  1.9*  --  1.8*  --   --   --   HEPARINUNFRC  --   --  0.37   < >  --  0.12* 0.20* 0.40  CREATININE 0.91   < >  --    < > 0.94 0.90  --  0.95   < > = values in this interval not displayed.    Estimated Creatinine Clearance: 95.2 mL/min (by C-G formula based on SCr of 0.95 mg/dL).   Medical History: Past Medical History:  Diagnosis Date   Cerebral palsy (HCC)    Chronic constipation    Coagulopathy (HCC)    DVT (deep venous thrombosis) (HCC)    Dysrhythmia    Hypertension    Insomnia    Pneumonia    Recurrent UTI    Tachycardia     Assessment: 18 yoM presents to ED on 11/29 with distended abdomen, vomiting, possible partial bowel obstruction vs ileus d/t constipation. 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 Warfarin was held at admission d/t NPO in case surgical intervention needed. Pharmacy is consulted to dose IV heparin.   Today, 12/30/22: HL decreased to 0.21, sub-therapeutic on 1150 units/hr CBC:  Hgb 12.8, plts 185 No bleeding per RN, no IV issues or pauses.     Goal of Therapy:  Heparin level 0.3-0.7 units/ml Monitor platelets by anticoagulation protocol: Yes   Plan:  Give heparin 1000 units bolus IV x 1 Increase to heparin IV infusion at 1300 units/hr Heparin level 6 hours after rate change Daily heparin level and CBC Follow up long-term anticoagulation plans    Lynann Beaver PharmD, BCPS [facility] [phone] 12/30/2022 9:53 AM   Addendum: Discussed with Dr. Jonathon Bellows and since no immediate surgical plans, will transition from Heparin to Enoxaparin while patient remains NPO and unable to take warfarin.   D/c Heparin and labs.   Start Lovenox 1 mg/kg (75mg ) SQ q12h. Pharmacy will sign off, but will monitor peripherally.  Lynann Beaver PharmD, BCPS [facility] pharmacy [phone] 12/30/2022 10:11 AM

## 2022-12-30 NOTE — Progress Notes (Signed)
PHARMACY - ANTICOAGULATION CONSULT NOTE  Pharmacy Consult for Heparin Indication: PTA warfarin on hold, Hx Afib and DVT  Allergies  Allergen Reactions   Betadine [Povidone Iodine] Other (See Comments)    Unknown reaction per caregiver   Ciprofloxacin Other (See Comments)    Unknown reaction per caregiver   Fentanyl Other (See Comments)    Previous reaction resulting in intubation, unclear details per family   Morphine Other (See Comments)    Unknown reaction per caregiver    Patient Measurements: Height: 5\' 5"  (165.1 cm) Weight: 75.5 kg IBW/kg (Calculated) : 61.5 Heparin Dosing Weight: total body weight   Vital Signs: Temp: 98.4 F (36.9 C) (12/02 0012) Temp Source: Oral (12/02 0012) BP: 117/65 (12/02 0200) Pulse Rate: 128 (12/02 0200)  Labs: Recent Labs    12/27/22 0911 12/27/22 1041 12/28/22 0544 12/28/22 1026 12/28/22 2253 12/29/22 1522 12/29/22 1755 12/30/22 0123  HGB 15.3   < > 13.4  --  13.9 12.8*  --  12.8*  HCT 47.3   < > 40.1  --  42.7 40.0  --  41.3  PLT 212  --  177  --  196 162  --  185  APTT 28  --   --   --   --   --   --   --   LABPROT 19.1*  --  22.4*  --  20.9*  --   --   --   INR 1.6*  --  1.9*  --  1.8*  --   --   --   HEPARINUNFRC  --    < > 0.37 0.46  --  0.12* 0.20* 0.40  CREATININE 0.91   < >  --  0.88 0.94 0.90  --   --    < > = values in this interval not displayed.    Estimated Creatinine Clearance: 100.4 mL/min (by C-G formula based on SCr of 0.9 mg/dL).   Medical History: Past Medical History:  Diagnosis Date   Cerebral palsy (HCC)    Chronic constipation    Coagulopathy (HCC)    DVT (deep venous thrombosis) (HCC)    Dysrhythmia    Hypertension    Insomnia    Pneumonia    Recurrent UTI    Tachycardia     Assessment: 44 yoM presents to ED on 11/29 with distended abdomen, vomiting, possible partial bowel obstruction vs ileus d/t constipation. 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 Warfarin was held at admission d/t NPO in case surgical intervention needed. Pharmacy is consulted to dose IV heparin. INR is subtherapeutic at 1.8.    Today, 12/30/22: HL 0.4 therapeutic on 1150 units/hr Hgb 12.8, plts 185 No bleeding per RN   Goal of Therapy:  Heparin level 0.3-0.7 units/ml Monitor platelets by anticoagulation protocol: Yes   Plan:  Continue heparin drip at 1150 units/hr Confirmatory level in 6 hours Daily CBC   Arley Phenix RPh 12/30/2022, 2:50 AM

## 2022-12-30 NOTE — Progress Notes (Signed)
PROGRESS NOTE [name]  UJW:[id number] DOB: [birth year] DOA: 12/28/2022 PCP: Fleet Contras, MD  Brief Narrative/Hospital Course: 42 y.o. male with medical history significant for cerebral palsy/spasticity, chronic constipation/SBO history hx of ExLap LOA 2019, DVT and PAF on warfarin, SBO, and recent admission abdominal pain and N/V suspected secondary to ileus was discharged on 11/30, now returns with recurrent abdominal pain. In the ED vitals stable afebrile labs mostly normal including lactic acid. 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 Dr. Donell Beers from surgery was consulted NGT was placed and admitted for further management. Surgery signed off as no further surgical need and GI consulted   Subjective: Seen and examined He feels better Baseline communication difficulties but able to express his needs Overnight afebrile intermittent tachycardia present  Labs with mild hyperglycemia otherwise stable  Patient expressing that he is tired of recurrent admissions, palliative care consulted  Eager to have his NG tube pulled out   Assessment and Plan: Principal Problem:   Abdominal pain Active Problems:   Infantile cerebral palsy (HCC)   Atrial fibrillation (HCC)   Hypertension   History of DVT (deep vein thrombosis)   Pain in the abdomen   Ogilvie syndrome   Adynamic ileus w/ 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  Surgery  advised no need for surgical intervention no clinical evidence of ischemic bowel advised GI consult to manage patient's adynamic ileus and resume chronic constipation regimen. Patient having multiple bowel movements, on empiric antibiotic NGT decompression will plan to DC NG tube today, stop antibiotics and start diet, GI agreeable.    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  Continue home metoprolol.  Metabolic acidosis: Patient bicarb low at 15 this morning but w/ normal lactic acid  Mild hyperglycemia: From poor intake, 2/2 #1 starting IV fluids with dextrose  Hypertension: BP stable, cont prn.  Cerebral palsy/spasticity: Supportive care. PTA on Flexeril/tizanidine/baclofen- continue same.  Cholelithiasis: Monitor as outpatient  Goals of care: Patient expressed that he is tired of recurrent admissions, with his poor baseline health at this time consulted palliative care  DVT prophylaxis: heparin. Code Status:   Code Status: Full Code Family Communication: plan of care discussed with patient at bedside. Patient status is: Inpatient because of chronic constipation with ileus and abdominal pain Level of care: Stepdown   Dispo: The patient is from: home            Anticipated disposition: tbd Objective: Vitals last 24 hrs: Vitals:   12/30/22 0012 12/30/22 0200 12/30/22 0800 12/30/22 0944  BP:  117/65  (!) 122/99  Pulse:  (!) 128  (!) 115  Resp:  (!) 22    Temp: 98.4 F (36.9 C)  98.6 F (37 C)   TempSrc: Oral  Axillary  SpO2:  93%    Height:       Weight change:   Physical Examination: General exam: alert  awake, wrist restraint in place.  HEENT:Oral mucosa moist, Ear/Nose WNL grossly Respiratory system: Bilaterally clear BS,no use of accessory muscle Cardiovascular system: S1 & S2 +, No JVD. Gastrointestinal system: Abdomen soft,NT,ND, BS+ Nervous System: Alert, awake, moving all extremities,and following commands. Extremities: LE edema neg,distal peripheral pulses palpable and warm.  Skin: No rashes,no icterus. MSK: Normal muscle bulk,tone, power   Medications reviewed:  Scheduled Meds:  baclofen  20 mg Per Tube QID   Chlorhexidine Gluconate Cloth  6 each Topical Daily   cyclobenzaprine  10 mg Per Tube BID   cyclobenzaprine  20 mg Per Tube Q24H   diazepam  2 mg Per Tube BID   enoxaparin (LOVENOX) injection  1 mg/kg Subcutaneous Q12H   Gerhardt's butt cream   Topical TID   metoprolol tartrate  25 mg Per Tube BID   pantoprazole (PROTONIX) IV  40 mg Intravenous Q24H   senna-docusate  3 tablet Per Tube QHS   sodium chloride flush  3 mL Intravenous Q12H   tiZANidine  2 mg Per Tube BID   tiZANidine  4 mg Per Tube Q1200   Continuous Infusions:  dextrose 5 % and 0.9 % NaCl 125 mL/hr at 12/30/22 0701   sodium chloride       Diet Order             Diet clear liquid Room service appropriate? Yes; Fluid consistency: Thin  Diet effective now                  Intake/Output Summary (Last 24 hours) at 12/30/2022 1048 Last data filed at 12/30/2022 0200 Gross per 24 hour  Intake 1447.35 ml  Output 875 ml  Net 572.35 ml   Net IO Since Admission: 1,822.4 mL [12/30/22 1048]  Wt Readings from Last 3 Encounters:  12/28/22 75.5 kg  02/04/22 77 kg  01/25/22 77.1 kg     Unresulted Labs (From admission, onward)     Start     Ordered   12/30/22 0500  Basic metabolic panel  Daily,   R      12/29/22 0533   12/30/22 0500  CBC  Daily,   R     Comments: Daily while on heparin drip    12/29/22 0651          Data Reviewed: I have personally reviewed following labs and imaging  studies CBC: Recent Labs  Lab 12/27/22 0911 12/27/22 1041 12/28/22 0544 12/28/22 2253 12/29/22 1522 12/30/22 0123  WBC 9.7  --  5.6 7.4 7.9 7.9  NEUTROABS 7.9*  --   --   --   --   --   HGB 15.3 16.0 13.4 13.9 12.8* 12.8*  HCT 47.3 47.0 40.1 42.7 40.0 41.3  MCV 94.8  --  92.6 94.1 97.8 98.1  PLT 212  --  177 196 162 185   Basic Metabolic Panel:  Recent Labs  Lab 12/27/22 0911 12/27/22 1041 12/28/22 1026 12/28/22 2253 12/29/22 1522 12/30/22 0123  NA 141 142 138 142 140 140  K 4.4 4.6 3.9 4.1 3.6 3.6  CL 104 103 108 108 110 108  CO2 27  --  23 26 19* 15*  GLUCOSE 105* 108* 110* 110* 91 67*  BUN 13 14 9 8 10 10   CREATININE 0.91 1.00 0.88 0.94 0.90 0.95  CALCIUM 8.9  --  7.9* 8.5* 7.8*  7.9*  MG  --   --  1.7  --   --   --    GFR: Estimated Creatinine Clearance: 95.2 mL/min (by C-G formula based on SCr of 0.95 mg/dL). Liver Function Tests:  Recent Labs  Lab 12/27/22 0911 12/28/22 1026 12/28/22 2253 12/29/22 1522 12/30/22 0123  AST 21 18 19 18 17   ALT 15 10 12 12 12   ALKPHOS 62 51 64 52 53  BILITOT 1.3* 1.0 1.2* 1.6* 1.3*  PROT 7.1 5.8* 6.9 5.6* 5.7*  ALBUMIN 3.9 3.2* 3.7 3.1* 3.2*   Recent Labs  Lab 12/27/22 0911 12/28/22 2253  LIPASE 26 27   No results for input(s): "AMMONIA" in the last 168 hours. Coagulation Profile:  Recent Labs  Lab 12/27/22 0911 12/28/22 0544 12/28/22 2253  INR 1.6* 1.9* 1.8*   Recent Labs  Lab 12/28/22 0743 12/30/22 0656  GLUCAP 102* 58*   Sepsis Labs: Recent Labs  Lab 12/29/22 0149 12/29/22 0841 12/29/22 1522 12/30/22 0902  LATICACIDVEN 1.0 1.2 1.1 1.5    Recent Results (from the past 240 hour(s))  Resp panel by RT-PCR (RSV, Flu A&B, Covid) Anterior Nasal Swab     Status: None   Collection Time: 12/27/22  9:39 AM   Specimen: Anterior Nasal Swab  Result Value Ref Range Status   SARS Coronavirus 2 by RT PCR NEGATIVE NEGATIVE Final    Comment: (NOTE) SARS-CoV-2 target nucleic acids are NOT DETECTED.  The  SARS-CoV-2 RNA is generally detectable in upper respiratory specimens during the acute phase of infection. The lowest concentration of SARS-CoV-2 viral copies this assay can detect is 138 copies/mL. A negative result does not preclude SARS-Cov-2 infection and should not be used as the sole basis for treatment or other patient management decisions. A negative result may occur with  improper specimen collection/handling, submission of specimen other than nasopharyngeal swab, presence of viral mutation(s) within the areas targeted by this assay, and inadequate number of viral copies(<138 copies/mL). A negative result must be combined with clinical observations, patient history, and epidemiological information. The expected result is Negative.  Fact Sheet for Patients:  BloggerCourse.com  Fact Sheet for Healthcare Providers:  SeriousBroker.it  This test is no t yet approved or cleared by the [place] FDA and  has been authorized for detection and/or diagnosis of SARS-CoV-2 by FDA under an Emergency Use Authorization (EUA). This EUA will remain  in effect (meaning this test can be used) for the duration of the COVID-19 declaration under Section 564(b)(1) of the Act, 21 U.S.C.section 360bbb-3(b)(1), unless the authorization is terminated  or revoked sooner.       Influenza A by PCR NEGATIVE NEGATIVE Final   Influenza B by PCR NEGATIVE NEGATIVE Final    Comment: (NOTE) The Xpert Xpress SARS-CoV-2/FLU/RSV plus assay is intended as an aid in the diagnosis of influenza from Nasopharyngeal swab specimens and should not be used as a sole basis for treatment. Nasal washings and aspirates are unacceptable for Xpert Xpress SARS-CoV-2/FLU/RSV testing.  Fact Sheet for Patients: BloggerCourse.com  Fact Sheet for Healthcare Providers: SeriousBroker.it  This test is not yet approved or  cleared by the [place] FDA and has been authorized for detection and/or diagnosis of SARS-CoV-2 by FDA under an Emergency Use Authorization (EUA). This EUA will remain in effect (meaning this test can be used) for the duration of the COVID-19 declaration under Section 564(b)(1) of the Act, 21 U.S.C. section 360bbb-3(b)(1), unless the authorization is terminated or revoked.     Resp  Syncytial Virus by PCR NEGATIVE NEGATIVE Final    Comment: (NOTE) Fact Sheet for Patients: BloggerCourse.com  Fact Sheet for Healthcare Providers: SeriousBroker.it  This test is not yet approved or cleared by the [place] FDA and has been authorized for detection and/or diagnosis of SARS-CoV-2 by FDA under an Emergency Use Authorization (EUA). This EUA will remain in effect (meaning this test can be used) for the duration of the COVID-19 declaration under Section 564(b)(1) of the Act, 21 U.S.C. section 360bbb-3(b)(1), unless the authorization is terminated or revoked.  Performed at [facility], [address] [place], [place] [address]   Blood Culture (routine x 2)     Status: None (Preliminary result)   Collection Time: 12/27/22 10:35 AM   Specimen: BLOOD  Result Value Ref Range Status   Specimen Description   Final    BLOOD SITE NOT SPECIFIED Performed at [facility], [address] [place], [place] [address]    Special Requests   Final    BOTTLES DRAWN AEROBIC AND ANAEROBIC Blood Culture results may not be optimal due to an inadequate volume of blood received in culture bottles Performed at [facility], [address] [place], [place] [address]    Culture   Final    NO GROWTH 3 DAYS Performed at [facility], [address] [place], [place] [address]    Report Status PENDING  Incomplete  Blood Culture (routine x 2)     Status: None (Preliminary result)    Collection Time: 12/27/22  7:15 PM   Specimen: BLOOD  Result Value Ref Range Status   Specimen Description   Final    BLOOD BLOOD LEFT HAND Performed at [facility], [address], [place], [place] [address]    Special Requests   Final    BOTTLES DRAWN AEROBIC AND ANAEROBIC Blood Culture adequate volume Performed at [facility], [address], [place], [place] [address]    Culture   Final    NO GROWTH 3 DAYS Performed at [facility], [address] [place], [place] [address]    Report Status PENDING  Incomplete  Culture, blood (routine x 2)     Status: None (Preliminary result)   Collection Time: 12/29/22  4:40 AM   Specimen: BLOOD RIGHT HAND  Result Value Ref Range Status   Specimen Description   Final    BLOOD RIGHT HAND Performed at [facility], [address] [place], [place] [address]    Special Requests   Final    Blood Culture results may not be optimal due to an inadequate volume of blood received in culture bottles BOTTLES DRAWN AEROBIC AND ANAEROBIC Performed at [facility], [address], [place], [place] [address]    Culture   Final    NO GROWTH < 24 HOURS Performed at [facility], [address] [place], [place] [address]    Report Status PENDING  Incomplete  Culture, blood (routine x 2)     Status: None (Preliminary result)   Collection Time: 12/29/22  3:22 PM   Specimen: BLOOD  Result Value Ref Range Status   Specimen Description   Final    BLOOD BLOOD RIGHT HAND Performed at [facility], [address], [place], [place] [address]    Special Requests   Final    BOTTLES DRAWN AEROBIC AND ANAEROBIC Blood Culture results may not be optimal  due to an inadequate volume of blood received in culture bottles Performed at [facility], [address], [place], [place] [address]    Culture   Final    NO GROWTH < 12 HOURS Performed at [facility], [address], [place], [place] [address]    Report Status PENDING  Incomplete  MRSA Next Gen by PCR, Nasal     Status: None   Collection Time: 12/29/22  3:47 PM   Specimen: Nasal Mucosa; Nasal Swab  Result Value Ref Range Status   MRSA by PCR Next Gen NOT DETECTED NOT DETECTED Final    Comment: (NOTE) The GeneXpert MRSA Assay (FDA approved for NASAL specimens only), is one component of a comprehensive MRSA colonization surveillance program. It is not intended to diagnose MRSA infection nor to guide or monitor treatment for MRSA infections. Test performance is not FDA approved in patients less than 109 years old. Performed at [facility], [address], [place], [place] [address]     Antimicrobials: Anti-infectives (From admission, onward)    Start     Dose/Rate Route Frequency Ordered Stop   12/29/22 1800  vancomycin (VANCOCIN) IVPB 1000 mg/200 mL premix  Status:  Discontinued        1,000 mg 200 mL/hr over 60 Minutes Intravenous Every 12 hours 12/29/22 0503 12/29/22 0533   12/29/22 1200  piperacillin-tazobactam (ZOSYN) IVPB 3.375 g  Status:  Discontinued        3.375 g 12.5 mL/hr over 240 Minutes Intravenous Every 8 hours 12/29/22 0456 12/30/22 1016   12/29/22 0415  vancomycin (VANCOCIN) IVPB 1000 mg/200 mL premix        1,000 mg 200 mL/hr over 60 Minutes Intravenous  Once 12/29/22 0407 12/29/22 0610   12/29/22 0415  piperacillin-tazobactam (ZOSYN) IVPB 3.375 g        3.375 g 100 mL/hr over 30 Minutes Intravenous  Once 12/29/22 0407 12/29/22 0500      Culture/Microbiology    Component Value Date/Time   SDES  12/29/2022 1522    BLOOD BLOOD RIGHT HAND Performed at [facility], [address] [place], [place] [address]    SPECREQUEST  12/29/2022 1522    BOTTLES DRAWN AEROBIC AND ANAEROBIC Blood Culture results may not be optimal due to an inadequate volume of blood received in culture bottles Performed at [facility], [address], [place], [place] [address]    CULT  12/29/2022 1522    NO GROWTH < 12 HOURS Performed at [facility], [address], [place], [place] [address]    REPTSTATUS PENDING 12/29/2022 1522    Radiology Studies: [facility] Chest Port 1 View  Result Date: 12/29/2022 CLINICAL DATA:  NG tube placement EXAM: PORTABLE CHEST 1 VIEW COMPARISON:  None Available. FINDINGS: Limited x-ray of the lower chest and upper abdomen demonstrates NG tube with the tip overlying the left upper quadrant, likely in the fundus of the stomach. There are some distended air-filled loops of bowel elsewhere in the upper abdomen. IMPRESSION: Enteric tube in place with tip overlying the fundus of the stomach in the left upper quadrant on this limited portable x-ray Electronically Signed   By: Karen Kays M.D.   On: 12/29/2022 13:36   DG Abdomen 1 View  Result Date: 12/29/2022 CLINICAL DATA:  NG tube placement. EXAM: ABDOMEN - 1 VIEW COMPARISON:  One-view abdomen 12/28/2022 FINDINGS: The side port of the NG tube is just past the  GE junction. Bowel gas pattern is normal. IMPRESSION: Side port of the NG tube is just past the GE junction. Electronically Signed   By: Marin Roberts M.D.   On: 12/29/2022 09:10   CT Angio Chest/Abd/Pel for Dissection W and/or W/WO  Result Date: 12/29/2022 CLINICAL DATA:  Abdominal pain with abnormal CT abdomen and pelvis yesterday with portal and mesenteric venous gas. EXAM: CT ANGIOGRAPHY CHEST, ABDOMEN AND PELVIS TECHNIQUE: Noncontrast CT of the chest was initially obtained. Multidetector CT imaging through the chest, abdomen and pelvis was performed using the standard protocol during bolus administration of intravenous contrast. Multiplanar reconstructed images and MIPs were obtained and reviewed to evaluate the vascular anatomy. RADIATION DOSE REDUCTION: This exam was performed according to the departmental dose-optimization program which includes automated  exposure control, adjustment of the mA and/or kV according to patient size and/or use of iterative reconstruction technique. CONTRAST:  OMNIPAQUE IOHEXOL 350 MG/ML SOLN COMPARISON:  Portable chest 12/27/2022, CTA chest 03/09/2010, CT abdomen and pelvis yesterday at 11:26 p.m. with contrast, CT abdomen pelvis 12/27/2022 with contrast, and CT abdomen and pelvis with IV contrast 05/14/2021. FINDINGS: CTA CHEST FINDINGS Cardiovascular: No arterial embolus is seen through the segmental level. The pulmonary arteries are normal in caliber. There is mild cardiomegaly with a left chamber predominance. No pulmonary venous dilatation is seen. No pericardial effusion. The aorta and great vessels are normal caliber. There is trace calcific plaque of the distal aortic arch. There is no aneurysm, dissection or stenosis. Mediastinum/Nodes: No enlarged mediastinal, hilar, or axillary lymph nodes. Thyroid gland, trachea, and esophagus demonstrate no significant findings. Lungs/Pleura: Trace right pleural effusion. No left pleural fluid. Mild posterior atelectasis both lungs. No consolidation or nodules. No pneumothorax. Diffuse bronchial thickening. Musculoskeletal: Mild-to-moderate thoracic dextroscoliosis. 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 No acute or other significant chest osseous findings. Subareolar gynecomastia. Review of the MIP images confirms the above findings. CTA ABDOMEN AND PELVIS FINDINGS VASCULAR Aorta: Normal. 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 Slight poststenotic ectasia with otherwise normal vessel opacification. No branch vessel occlusion. SMA: Patent without evidence of aneurysm, dissection, vasculitis or significant stenosis. No branch occlusion is seen. Renals: Both are single. Both are unremarkable. No branch occlusion is seen. IMA: Normal. Inflow: Patent without evidence of aneurysm, dissection, vasculitis or  significant stenosis. Once again the left common iliac and external iliac arteries are asymmetrically larger. 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 Again noted is mesenteric venous gas in the left abdomen posterior to the stomach. Review of the MIP images confirms the above findings. NON-VASCULAR Hepatobiliary: There are small scattered hepatic cysts, and again noted portal venous gas. There are stones within a mildly dilated but otherwise unremarkable gallbladder without wall thickening. No biliary dilatation. Pancreas: No abnormality. Spleen: No abnormality. Adrenals/Urinary Tract: Small renal cysts. No follow-up imaging recommended. No adrenal mass. No evidence of obstructive uropathy or stones. Unremarkable bladder. Stomach/Bowel: Stomach is distended with air and fluid. Query wall pneumatosis posteriorly, seen on the recent CT but not the earlier studies. No small bowel dilatation is seen. There is fluid in the colon and colonic dilatation up to 8 cm. There is no visible bowel pneumatosis. The colonic dilatation was seen on  all prior studies including in 2023 and the patient may have Ogilvie's syndrome. This is not associated with wall thickening or inflammatory change. An appendix is not seen. Lymphatic: No adenopathy is evident. Reproductive: Enlarged prostate, 5.6 cm. Other: No free air or fluid.  No incarcerated hernia. Musculoskeletal: No acute or significant osseous findings. Review of the MIP images confirms the above findings. IMPRESSION: 1. No evidence of pulmonary arterial embolus or aortic dissection. 2. Trace right pleural effusion. 3. Diffuse bronchial thickening.  No focal infiltrate. 4. Mild cardiomegaly. 5. 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 No SMA stenosis or branch vessel occlusion. 6. 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 7. 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 8. Mesenteric venous gas in the left abdomen posterior to the stomach. Query gastric wall pneumatosis posteriorly, seen on the recent CT but not the earlier studies. Wall ischemia not excluded. 9. Fluid in the colon and colonic dilatation up to 8 cm. No visible bowel pneumatosis. 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 10. Cholelithiasis. 11. Enlarged prostate. 12. Subareolar gynecomastia. 13. Aortic atherosclerosis. Aortic Atherosclerosis (ICD10-I70.0). Electronically Signed   By: Almira Bar M.D.   On: 12/29/2022 02:24   CT ABDOMEN PELVIS W CONTRAST  Result Date: 12/29/2022 CLINICAL DATA:  Abdominal pain, acute, nonlocalized hx SBO, worsening pain Pt. BIB [facility] for abdominal pain x1 day. States that the last few hours his pain has been unbearable. Hx of cerebral palsy and SBO. Per EMS pt. Belched and stated that he felt better. EXAM: CT ABDOMEN AND PELVIS WITH CONTRAST TECHNIQUE: Multidetector CT imaging of the abdomen and pelvis was performed using the standard protocol following bolus administration of intravenous contrast. RADIATION DOSE REDUCTION: This exam was performed according to the departmental dose-optimization program which includes automated exposure control, adjustment of the mA and/or kV according to patient size and/or use of iterative reconstruction technique. CONTRAST:  OMNIPAQUE IOHEXOL 300 MG/ML  SOLN COMPARISON:  CT abdomen pelvis 12/27/2022 FINDINGS: Lower chest: No acute abnormality. Hepatobiliary: Subcentimeter hypodensities too  small. Otherwise no focal liver abnormality. Calcified gallstone noted within the gallbladder lumen. No gallbladder wall thickening or pericholecystic fluid. No biliary dilatation. Pancreas: No focal lesion. Normal pancreatic contour. No surrounding inflammatory changes. No main pancreatic ductal dilatation. Spleen: Normal in size without focal abnormality. Adrenals/Urinary Tract: No adrenal nodule bilaterally. Bilateral kidneys enhance symmetrically. Subcentimeter hypodensities are too small to characterize-no further follow-up indicated. No hydronephrosis. No hydroureter. The urinary bladder is unremarkable. Stomach/Bowel: Pneumatosis of the gastric fundus is not fully excluded (11:13). Interval resolution of small bowel dilatation. No evidence of bowel wall thickening. Fluid within the lumen of the colon. Stool noted within the rectum. Appendix appears normal. Vascular/Lymphatic: Interval development of portal venous gas and mesenteric venous gas. 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 No abdominal aorta or iliac aneurysm. No abdominal, pelvic, or inguinal lymphadenopathy. Reproductive: Prostate is enlarged measuring up to 5.8 cm Other: No intraperitoneal free fluid. No intraperitoneal free gas. No organized fluid collection. Musculoskeletal: No abdominal wall hernia or abnormality. Persistent left hip subcutaneus  soft tissue edema. No suspicious lytic or blastic osseous lesions. No acute displaced fracture. IMPRESSION: 1. Resolved small and large bowel dilatation with persistent diarrheal state; however, interval development of portal venous and mesenteric venous gas concerning for sequelae of associated ischemia/necrosis. Pneumatosis of the gastric fundus is not fully excluded. Recommend trending of lactate levels. No  bowel perforation. 2. Cholelithiasis with no CT evidence of acute cholecystitis. 3. Prostatomegaly. 4. 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 These results were called by telephone at the time of interpretation on 12/29/2022 at 12:24 am to provider [facility] , who verbally acknowledged these results. Electronically Signed   By: Tish Frederickson M.D.   On: 12/29/2022 00:29     LOS: 1 day   Total time spent in review of labs and imaging, patient evaluation, formulation of plan, documentation and communication with family: 35 minutes  Lanae Boast, MD [facility]  12/30/2022, 10:48 AM

## 2022-12-30 NOTE — Progress Notes (Signed)
This nurse was able to administer PO meds/food safely to this patient this day. Patient in a cradle position in bed and PO meds given at highest tolerable angle. Patient needs thickened liquids. Per Dr. Jonathon Bellows ok to change liquid intake orders from thin to nectar thick.

## 2022-12-30 NOTE — Progress Notes (Signed)
PIV consult: B forearm sites patent. Pt with bruising and swelling in L upper arm. Recommend PICC vs central line if prolonged venous access is required.

## 2022-12-30 NOTE — Progress Notes (Signed)
Attempted to feed patient lunch. Patient was moved into the most upright position possible and patient was unable to stay upright (continued to arch his back, stiffen his entire body, and slid to the end of the bed). Patient's head/neck was then at a 90 degree angle. Patient was repositioned again in bed and attempts were made to give fluid; patient immediately started coughing (high risk of aspiration). Spoke with speech therapist about these concerns and speech therapist was unable to totally evaluate patient's swallowing due to same reason. Informed Dr. Jonathon Bellows of these concerns.

## 2022-12-30 NOTE — Progress Notes (Addendum)
Progress Note   Subjective  Chief Complaint: "Chronic adynamic ileus"  This morning, patient is able to tell me he feels better.  He would like his NG tube removed.  He has had and continues to have multiple bowel movements.  There is about 100 cc of bilious fluid in his NG canister.  Hospitalist enters the room at the end of my exam.  Has plans to remove NG tube this morning.   Objective   Vital signs in last 24 hours: Temp:  [98.4 F (36.9 C)-98.6 F (37 C)] 98.6 F (37 C) (12/02 0800) Pulse Rate:  [69-131] 115 (12/02 0944) Resp:  [16-31] 22 (12/02 0200) BP: (92-135)/(48-99) 122/99 (12/02 0944) SpO2:  [91 %-100 %] 93 % (12/02 0200) Last BM Date : 12/29/22 General:    white male in NAD with cerebral palsy and contractures Heart:  Regular rate and rhythm; no murmurs Lungs: Respirations even and unlabored, lungs CTA bilaterally Abdomen:  Soft, nontender and nondistended. Normal bowel sounds. Extremities: + Musculoskeletal deformities and contractures Psych:  Cooperative.  Intake/Output from previous day: 12/01 0701 - 12/02 0700 In: 1447.4 [I.V.:1297.2; IV Piggyback:150.1] Out: 875 [Urine:800; Emesis/NG output:70; Stool:5]   Lab Results: Recent Labs    12/28/22 2253 12/29/22 1522 12/30/22 0123  WBC 7.4 7.9 7.9  HGB 13.9 12.8* 12.8*  HCT 42.7 40.0 41.3  PLT 196 162 185   BMET Recent Labs    12/28/22 2253 12/29/22 1522 12/30/22 0123  NA 142 140 140  K 4.1 3.6 3.6  CL 108 110 108  CO2 26 19* 15*  GLUCOSE 110* 91 67*  BUN 8 10 10   CREATININE 0.94 0.90 0.95  CALCIUM 8.5* 7.8* 7.9*   LFT Recent Labs    12/30/22 0123  PROT 5.7*  ALBUMIN 3.2*  AST 17  ALT 12  ALKPHOS 53  BILITOT 1.3*  BILIDIR 0.2  IBILI 1.1*   PT/INR Recent Labs    12/28/22 0544 12/28/22 2253  LABPROT 22.4* 20.9*  INR 1.9* 1.8*    Studies/Results: DG Chest Port 1 View  Result Date: 12/29/2022 CLINICAL DATA:  NG tube placement EXAM: PORTABLE CHEST 1 VIEW COMPARISON:  None  Available. FINDINGS: Limited x-ray of the lower chest and upper abdomen demonstrates NG tube with the tip overlying the left upper quadrant, likely in the fundus of the stomach. There are some distended air-filled loops of bowel elsewhere in the upper abdomen. IMPRESSION: Enteric tube in place with tip overlying the fundus of the stomach in the left upper quadrant on this limited portable x-ray Electronically Signed   By: Karen Kays M.D.   On: 12/29/2022 13:36   DG Abdomen 1 View  Result Date: 12/29/2022 CLINICAL DATA:  NG tube placement. EXAM: ABDOMEN - 1 VIEW COMPARISON:  One-view abdomen 12/28/2022 FINDINGS: The side port of the NG tube is just past the GE junction. Bowel gas pattern is normal. IMPRESSION: Side port of the NG tube is just past the GE junction. Electronically Signed   By: Marin Roberts M.D.   On: 12/29/2022 09:10   CT Angio Chest/Abd/Pel for Dissection W and/or W/WO  Result Date: 12/29/2022 CLINICAL DATA:  Abdominal pain with abnormal CT abdomen and pelvis yesterday with portal and mesenteric venous gas. EXAM: CT ANGIOGRAPHY CHEST, ABDOMEN AND PELVIS TECHNIQUE: Noncontrast CT of the chest was initially obtained. Multidetector CT imaging through the chest, abdomen and pelvis was performed using the standard protocol during bolus administration of intravenous contrast. Multiplanar reconstructed images and MIPs were  obtained and reviewed to evaluate the vascular anatomy. RADIATION DOSE REDUCTION: This exam was performed according to the departmental dose-optimization program which includes automated exposure control, adjustment of the mA and/or kV according to patient size and/or use of iterative reconstruction technique. CONTRAST:  OMNIPAQUE IOHEXOL 350 MG/ML SOLN COMPARISON:  Portable chest 12/27/2022, CTA chest 03/09/2010, CT abdomen and pelvis yesterday at 11:26 p.m. with contrast, CT abdomen pelvis 12/27/2022 with contrast, and CT abdomen and pelvis with IV contrast  05/14/2021. FINDINGS: CTA CHEST FINDINGS Cardiovascular: No arterial embolus is seen through the segmental level. The pulmonary arteries are normal in caliber. There is mild cardiomegaly with a left chamber predominance. No pulmonary venous dilatation is seen. No pericardial effusion. The aorta and great vessels are normal caliber. There is trace calcific plaque of the distal aortic arch. There is no aneurysm, dissection or stenosis. Mediastinum/Nodes: No enlarged mediastinal, hilar, or axillary lymph nodes. Thyroid gland, trachea, and esophagus demonstrate no significant findings. Lungs/Pleura: Trace right pleural effusion. No left pleural fluid. Mild posterior atelectasis both lungs. No consolidation or nodules. No pneumothorax. Diffuse bronchial thickening. Musculoskeletal: Mild-to-moderate thoracic dextroscoliosis. 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 No acute or other significant chest osseous findings. Subareolar gynecomastia. Review of the MIP images confirms the above findings. CTA ABDOMEN AND PELVIS FINDINGS VASCULAR Aorta: Normal. 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 Slight poststenotic ectasia with otherwise normal vessel opacification. No branch vessel occlusion. SMA: Patent without evidence of aneurysm, dissection, vasculitis or significant stenosis. No branch occlusion is seen. Renals: Both are single. Both are unremarkable. No branch occlusion is seen. IMA: Normal. Inflow: Patent without evidence of aneurysm, dissection, vasculitis or significant stenosis. Once again the left common iliac and external iliac arteries are asymmetrically larger. 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 Again noted is mesenteric venous gas in the left abdomen posterior to the stomach. Review of the MIP images confirms the above findings. NON-VASCULAR Hepatobiliary: There are small scattered hepatic cysts, and again noted portal venous gas. There are stones within a mildly dilated but otherwise unremarkable gallbladder without wall thickening. No biliary dilatation. Pancreas: No abnormality. Spleen: No abnormality. Adrenals/Urinary Tract: Small renal cysts. No follow-up imaging recommended. No adrenal mass. No evidence of obstructive uropathy or stones. Unremarkable bladder. Stomach/Bowel: Stomach is distended with air and fluid. Query wall pneumatosis posteriorly, seen on the recent CT but not the earlier studies. No small bowel dilatation is seen. There is fluid in the colon and colonic dilatation up to 8 cm. There is no visible bowel pneumatosis. 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 This is not associated with wall thickening or inflammatory change. An appendix is not seen. Lymphatic: No adenopathy is evident. Reproductive: Enlarged prostate, 5.6 cm. Other: No free air or fluid.  No incarcerated hernia. Musculoskeletal: No acute or significant osseous findings. Review of the MIP images confirms the above findings. IMPRESSION: 1. No evidence of pulmonary arterial embolus or aortic dissection. 2. Trace right pleural effusion. 3. Diffuse bronchial thickening.  No focal infiltrate. 4. Mild cardiomegaly. 5. 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 No  SMA stenosis or branch vessel occlusion. 6. 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 7. 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 8.  Mesenteric venous gas in the left abdomen posterior to the stomach. Query gastric wall pneumatosis posteriorly, seen on the recent CT but not the earlier studies. Wall ischemia not excluded. 9. Fluid in the colon and colonic dilatation up to 8 cm. No visible bowel pneumatosis. 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 10. Cholelithiasis. 11. Enlarged prostate. 12. Subareolar gynecomastia. 13. Aortic atherosclerosis. Aortic Atherosclerosis (ICD10-I70.0). Electronically Signed   By: Almira Bar M.D.   On: 12/29/2022 02:24   CT ABDOMEN PELVIS W CONTRAST  Result Date: 12/29/2022 CLINICAL DATA:  Abdominal pain, acute, nonlocalized hx SBO, worsening pain Pt. BIB [facility] for abdominal pain x1 day. States that the last few hours his pain has been unbearable. Hx of cerebral palsy and SBO. Per EMS pt. Belched and stated that he felt better. EXAM: CT ABDOMEN AND PELVIS WITH CONTRAST TECHNIQUE: Multidetector CT imaging of the abdomen and pelvis was performed using the standard protocol following bolus administration of intravenous contrast. RADIATION DOSE REDUCTION: This exam was performed according to the departmental dose-optimization program which includes automated exposure control, adjustment of the mA and/or kV according to patient size and/or use of iterative reconstruction technique. CONTRAST:  OMNIPAQUE IOHEXOL 300 MG/ML  SOLN COMPARISON:  CT abdomen pelvis 12/27/2022 FINDINGS: Lower chest: No acute abnormality. Hepatobiliary: Subcentimeter hypodensities too small. Otherwise no focal liver abnormality. Calcified gallstone noted within the gallbladder lumen. No gallbladder wall thickening or pericholecystic fluid. No biliary dilatation. Pancreas: No focal lesion. Normal pancreatic contour. No surrounding inflammatory changes. No main pancreatic ductal dilatation. Spleen: Normal in size without focal abnormality. Adrenals/Urinary Tract:  No adrenal nodule bilaterally. Bilateral kidneys enhance symmetrically. Subcentimeter hypodensities are too small to characterize-no further follow-up indicated. No hydronephrosis. No hydroureter. The urinary bladder is unremarkable. Stomach/Bowel: Pneumatosis of the gastric fundus is not fully excluded (11:13). Interval resolution of small bowel dilatation. No evidence of bowel wall thickening. Fluid within the lumen of the colon. Stool noted within the rectum. Appendix appears normal. Vascular/Lymphatic: Interval development of portal venous gas and mesenteric venous gas. 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 No abdominal aorta or iliac aneurysm. No abdominal, pelvic, or inguinal lymphadenopathy. Reproductive: Prostate is enlarged measuring up to 5.8 cm Other: No intraperitoneal free fluid. No intraperitoneal free gas. No organized fluid collection. Musculoskeletal: No abdominal wall hernia or abnormality. Persistent left hip subcutaneus soft tissue edema. No suspicious lytic or blastic osseous lesions. No acute displaced fracture. IMPRESSION: 1. Resolved small and large bowel dilatation with persistent diarrheal state; however, interval development of portal venous and mesenteric venous gas concerning for sequelae of associated ischemia/necrosis. Pneumatosis of the gastric fundus is not fully excluded. Recommend trending of lactate levels. No bowel perforation. 2. Cholelithiasis with no CT evidence of acute cholecystitis. 3. Prostatomegaly. 4. 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 These results were called by telephone  at the time  of interpretation on 12/29/2022 at 12:24 am to provider [facility] , who verbally acknowledged these results. Electronically Signed   By: Tish Frederickson M.D.   On: 12/29/2022 00:29       Assessment / Plan:   Assessment: 1.  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  Portal and mesenteric venous gas on CT scan: Unclear etiology, lactate normal, question of pneumatosis in the gastric fundus 3.  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  Cerebral palsy: With spasticity 5.  Chronic constipation 6.  History of SBO and LOA 2019 7.  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  Okay with NG tube removal this morning and trying to let him eat 2.  Again no sense in pursuing endoscopic evaluation given the possibility and question of pneumatosis 3.  Continue Pantoprazole  We may sign off today.  Please let us know if you have any further questions.    LOS: 1 day   Unk Lightning  12/30/2022, 10:33 AM  GI ATTENDING  Interval history and data reviewed.  Yesterday's x-rays reviewed.  Case discussed with Dr. Leone Payor.  Agree with interval comprehensive progress note as outlined above.  Agree with removing NG tube.  The patient's abdomen is benign.  X-rays are unremarkable with normal bowel gas pattern.  Advance diet as tolerated.  Should go home on some sort of bowel regimen, such as titratable MiraLAX, to assure 1 or 2 daily bowel movements.  No further recommendations or plans from GI perspective.  Note outpatient GI follow-up required.  Will sign off.  Wilhemina Bonito. Eda Keys., M.D. [facility] Division of Gastroenterology

## 2022-12-31 DIAGNOSIS — G809 Cerebral palsy, unspecified: Secondary | ICD-10-CM | POA: Diagnosis not present

## 2022-12-31 DIAGNOSIS — K5981 Ogilvie syndrome: Secondary | ICD-10-CM

## 2022-12-31 DIAGNOSIS — R4589 Other symptoms and signs involving emotional state: Secondary | ICD-10-CM

## 2022-12-31 DIAGNOSIS — Z7189 Other specified counseling: Secondary | ICD-10-CM

## 2022-12-31 DIAGNOSIS — R1084 Generalized abdominal pain: Secondary | ICD-10-CM | POA: Diagnosis not present

## 2022-12-31 DIAGNOSIS — Z515 Encounter for palliative care: Secondary | ICD-10-CM

## 2022-12-31 LAB — BASIC METABOLIC PANEL
Anion gap: 7 (ref 5–15)
BUN: <5 mg/dL — ABNORMAL LOW (ref 6–20)
CO2: 24 mmol/L (ref 22–32)
Calcium: 7.3 mg/dL — ABNORMAL LOW (ref 8.9–10.3)
Chloride: 107 mmol/L (ref 98–111)
Creatinine, Ser: 0.71 mg/dL (ref 0.61–1.24)
GFR, Estimated: >60 mL/min (ref >60)
Glucose, Bld: 137 mg/dL — ABNORMAL HIGH (ref 70–99)
Potassium: 3.3 mmol/L — ABNORMAL LOW (ref 3.5–5.1)
Sodium: 138 mmol/L (ref 135–145)

## 2022-12-31 LAB — CBC
HCT: 35.3 % — ABNORMAL LOW (ref 39.0–52.0)
Hemoglobin: 11.5 g/dL — ABNORMAL LOW (ref 13.0–17.0)
MCH: 30.5 pg (ref 26.0–34.0)
MCHC: 32.6 g/dL (ref 30.0–36.0)
MCV: 93.6 fL (ref 80.0–100.0)
Platelets: 160 10*3/uL (ref 150–400)
RBC: 3.77 MIL/uL — ABNORMAL LOW (ref 4.22–5.81)
RDW: 14.6 % (ref 11.5–15.5)
WBC: 5.4 10*3/uL (ref 4.0–10.5)
nRBC: 0 % (ref 0.0–0.2)

## 2022-12-31 LAB — PROTIME-INR
INR: 4.1 (ref 0.8–1.2)
INR: 3.6 — ABNORMAL HIGH (ref 0.8–1.2)
Prothrombin Time: 40 s — ABNORMAL HIGH (ref 11.4–15.2)
Prothrombin Time: 36.4 s — ABNORMAL HIGH (ref 11.4–15.2)

## 2022-12-31 LAB — GLUCOSE, CAPILLARY
Glucose-Capillary: 125 mg/dL — ABNORMAL HIGH (ref 70–99)
Glucose-Capillary: 110 mg/dL — ABNORMAL HIGH (ref 70–99)

## 2022-12-31 MED ORDER — POTASSIUM CHLORIDE CRYS ER 20 MEQ PO TBCR
40.0000 meq | EXTENDED_RELEASE_TABLET | Freq: Once | ORAL | Status: AC
  Administered 2022-12-31: 40 meq via ORAL
  Filled 2022-12-31: qty 2

## 2022-12-31 MED ORDER — DEXTROSE-SODIUM CHLORIDE 5-0.9 % IV SOLN: INTRAVENOUS | Status: AC

## 2022-12-31 NOTE — Plan of Care (Signed)

## 2022-12-31 NOTE — TOC Initial Note (Signed)
Transition of Care [facility]) - Initial/Assessment Note    Patient Details  Name: [name] MRN: [id number] Date of Birth: 1980-04-10  [facility] CM/SW Contact:    Lanier Clam, RN Phone Number: 12/31/2022, 1:05 PM  Clinical Narrative: d/c plan home. Has private duty care. Has PCP,transportation.No further CM needs.                  Expected Discharge Plan: Home/Self Care Barriers to Discharge: Continued Medical Work up   Patient Goals and CMS Choice Patient states their goals for this hospitalization and ongoing recovery are:: Home CMS Medicare.gov Compare Post Acute Care list provided to:: Patient Choice offered to / list presented to : Patient [place] ownership interest in [facility].provided to:: Patient    Expected Discharge Plan and Services     Post Acute Care Choice: Resumption of Svcs/PTA Provider Living arrangements for the past 2 months: Apartment                                      Prior Living Arrangements/Services Living arrangements for the past 2 months: Apartment Lives with:: Roommate                   Activities of Daily Living   ADL Screening (condition at time of admission) Independently performs ADLs?: No Does the patient have a NEW difficulty with bathing/dressing/toileting/self-feeding that is expected to last >3 days?: Yes (Initiates electronic notice to provider for possible OT consult) Does the patient have a NEW difficulty with getting in/out of bed, walking, or climbing stairs that is expected to last >3 days?: Yes (Initiates electronic notice to provider for possible PT consult) Does the patient have a NEW difficulty with communication that is expected to last >3 days?: No Is the patient deaf or have difficulty hearing?: No Does the patient have difficulty seeing, even when wearing glasses/contacts?: No Does the patient have difficulty concentrating, remembering, or making decisions?:  No  Permission Sought/Granted                  Emotional Assessment              Admission diagnosis:  Pain in the abdomen [R10.9] Intra-abdominal infection [B99.9] Abdominal pain [R10.9] Partial intestinal obstruction, unspecified cause (HCC) [K56.600] Patient Active Problem List   Diagnosis Date Noted   Palliative care encounter 12/31/2022   Goals of care, counseling/discussion 12/31/2022   Counseling and coordination of care 12/31/2022   Need for emotional support 12/31/2022   Abdominal pain 12/29/2022   Pain in the abdomen 12/29/2022   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   Bowel obstruction (HCC) 12/28/2022   SBO (small bowel obstruction) (HCC) 02/16/2019   Aspiration pneumonia of both lower lobes due to gastric secretions (HCC) 02/15/2018   Sacral decubitus ulcer, stage II (HCC) 02/15/2018   Community acquired pneumonia 02/14/2018   Ileus (HCC) 02/14/2018   Tachycardia, unspecified 02/14/2018   Pressure injury of skin 12/24/2017   Encephalopathy acute    Anticoagulated 12/15/2017   Respiratory failure (HCC)    Lactic acidosis 12/14/2017   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   Cellulitis of left leg 05/13/2015   Sepsis (HCC) 05/13/2015   History of DVT (deep vein thrombosis) 05/13/2015   Neurogenic bladder 05/13/2015   Spasticity 12/02/2013   Small bowel obstruction (HCC) 06/05/2011   Acute renal failure (HCC) 06/05/2011   Chronic constipation 06/05/2011  Hypertension 06/05/2011   Hypokalemia 06/05/2011   Low TSH level 06/05/2011   Groin mass 06/05/2011   Loose stools 11/15/2010   Bacteremia 10/16/2010   MSSA (methicillin susceptible Staphylococcus aureus) septicemia (HCC) 10/16/2010   Abscess of arm, left 10/16/2010   Constipation 11/29/2009   SHOULDR&UPPR ARM ABRASION/FRICTION BURN INFECTED 01/16/2007   SORE THROAT 10/30/2006   Atrial fibrillation (HCC) 08/27/2006   ANEMIA, PERNICIOUS 07/25/2006   Infantile cerebral palsy (HCC) 07/25/2006   PCP:   Fleet Contras, MD Pharmacy:   [facility] - [place], [place] - [address] [place] [address] Phone: [phone] Fax: [phone]     Social Determinants of Health (SDOH) Social History: SDOH Screenings   Food Insecurity: No Food Insecurity (12/29/2022)  Housing: Low Risk  (12/29/2022)  Transportation Needs: No Transportation Needs (12/29/2022)  Utilities: Not At Risk (12/30/2022)  Social Connections: Unknown (02/14/2018)  Tobacco Use: Low Risk  (12/29/2022)   SDOH Interventions:     Readmission Risk Interventions    12/28/2022    4:07 PM  Readmission Risk Prevention Plan  Transportation Screening Complete  PCP or Specialist Appt within 5-7 Days Complete  Home Care Screening Complete  Medication Review (RN CM) Complete

## 2022-12-31 NOTE — Progress Notes (Signed)
Speech Language Pathology Treatment: Dysphagia  Patient Details Name: [name] MRN: [id number] DOB: [birth year] Today's Date: 12/31/2022 Time: 6213-0865 SLP Time Calculation (min) (ACUTE ONLY): 16 min  Assessment / Plan / Recommendation Clinical Impression  Pt seen for follow up dysphagia intervention. Per RN hospitalist MD stated pt's diet could be advanced as tolerated. RN on the phone with [name], pt's caregiver who reported pt consumed a regular diet, thin liquids at home and took pills whole in puree. RN recently completed oral care and [name]'s positioning was adequate with bed placed in trendelenberg position with head of bed elevated and trunk in center of mattress. He consumed thin liquids via straw placed on right side of mouth with better labial seal as recommended by evaluating SLP. He took fast, swift sips and SLP removed after 2 sips to control volume. There was audible swallow and occasional second swallow but no signs of airway compromise over multiple trials. Risk is somewhat higher given reduced oral control. Pt given multiple trials of solid texture. It can be common for pt's with CP to have decreased duration and efficiency of mastication although he masticated with somewhat of a "munch chew pattern" duration was adequate and he did not have oral residue. Despite overall functional mastication with current illness a Dys 3 (chopped meats) texture is recommended while he is in the hospital to conserve energy, thin liquids and pills whole with puree. If Dys 3 restricts him from ordering foods he desires, it would be appropriate to upgrade him to regular and staff can cut his meats. Continue upright positioning using trendelenberg and raising head of bed upright to keep his from sliding down in the bed. No further ST is needed.    HPI HPI: Patient is a 42 y.o. male with PMH: cerebral palsy, chronic constipation, h/o SBO and LOA 2019, chronically dilated colon consistent with  Ogilvie syndrome, DVT and PAF on Warfarin, recent admission on 11./29/24 (discharged 11/30) with abdominal pain, nausea and vomiting suspected secondary to ileus. He returned to the hospital on 12/1 after developing a severe recurrence and abdominal pain. Per EMS, patient experienced improvement of his symptoms after belching. In ED, he was afebrile, mild tachycardia, stable BP. CT abd/pelvis demonstrated resolution of bolel dilatation but concerning for interval development of portal venous and mesenteric venous gas as well as possible gastric wall pneumatosis. NG was placed and GI following. NG tube removed 12/2 as recommended by GI and he was started on clear liquids diet. SLP ordered to evaluate swallow function at bedside.      SLP Plan  All goals met;Discharge SLP treatment due to (comment)      Recommendations for follow up therapy are one component of a multi-disciplinary discharge planning process, led by the attending physician.  Recommendations may be updated based on patient status, additional functional criteria and insurance authorization.    Recommendations  Diet recommendations: Dysphagia 3 (mechanical soft);Thin liquid Liquids provided via: Straw Medication Administration: Whole meds with puree Supervision: Staff to assist with self feeding Compensations: Minimize environmental distractions;Slow rate;Small sips/bites Postural Changes and/or Swallow Maneuvers: Seated upright 90 degrees                  Oral care BID;Staff/trained caregiver to provide oral care   Intermittent Supervision/Assistance Dysphagia, oropharyngeal phase (R13.12)     All goals met;Discharge SLP treatment due to (comment)     Royce Macadamia  12/31/2022, 1:57 PM

## 2022-12-31 NOTE — Consult Note (Addendum)
Consultation Note Date: 12/31/2022   Patient Name: [name]  DOB: 1980-08-08  MRN: [id number]  Age / Sex: 42 y.o., male   PCP: Fleet Contras, MD Referring Physician: Lanae Boast, MD  Reason for Consultation: Establishing goals of care; "chronic illness, patient states he does not want to be in hospital anymore".      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 During hospitalization, patient has received management chronic abdominal pain and chronic constipation. GI consulted for recommendations. Palliative medicine team consulted to assist with complex medical decision making.   Extensive review of EMR prior to seeing patient.   When presenting to bedside, patient laying in bed without any visitors at bedside. Introduced myself and the role of the palliative medicine team to patient. Spent time learning about patient's medical journey. Patient feels his abdominal pain is currently improved and he is thankful for this. Though patient deals with chronic illness, he notes he wants to come back to the hospital if needed for medical management since he feels the hospital helps to manage his illnesses and improve his state. He denies stating he didn't want to come back to the hospital if needed. Noted he can come back to the hospital if he feels it is benefiting his care. Acknowledged his wishes for this.  With his permission, inquired if his code status had been discussed with him previously and he denied it had been. Spent time explaining full code vs DNR, continue appropraite medical interventions. Did express concern that if patient were sick enough his heart stopped or  his stopped breathing, interventions such as cardiac resuscitation and intubation and mechanical intubation would likely not lead to a good quality of life outcome with his underlying medical conditions. Patient acknowledged this and noted he would consider if he wants to change his code status. Stated he can let a provider know if he wants to change this.  Spent time providing emotional support via active listening. Thanked him for allowing me to speak with him today.   Updated hospitalist and RN regarding discussion. Patient likely to discharge tomorrow as per hospitalist note.   Primary Diagnoses  Present on Admission:  Abdominal pain  Infantile cerebral palsy (HCC)  Hypertension  Atrial fibrillation (HCC)  Pain in the abdomen   Palliative Review of Systems: Abdominal pain improved   Past Medical History:  Diagnosis Date   Cerebral palsy (HCC)    Chronic constipation    Coagulopathy (HCC)    DVT (deep venous thrombosis) (HCC)    Dysrhythmia    Hypertension    Insomnia    Pneumonia    Recurrent UTI    Tachycardia    Social History   Socioeconomic History   Marital status: Single    Spouse name: Not on file   Number of children: Not on file   Years of education: Not on file   Highest education level: Not on file  Occupational History   Not on file  Tobacco Use   Smoking status: Never   Smokeless tobacco: Never  Vaping Use   Vaping status: Never Used  Substance and Sexual Activity   Alcohol use: No   Drug use: No  Sexual activity: Never  Other Topics Concern   Not on file  Social History Narrative   Not on file   Social Determinants of Health   Financial Resource Strain: Not on file  Food Insecurity: No Food Insecurity (12/29/2022)   Hunger Vital Sign    Worried About Running Out of Food in the Last Year: Never true    Ran Out of Food in the Last Year: Never true  Transportation Needs: No Transportation Needs (12/29/2022)   PRAPARE - Therapist, art (Medical): No    Lack of Transportation (Non-Medical): No  Physical Activity: Not on file  Stress: Not on file  Social Connections: Unknown (02/14/2018)   Social Connection and Isolation Panel [NHANES]    Frequency of Communication with Friends and Family: Patient declined    Frequency of Social Gatherings with Friends and Family: Patient declined    Attends Religious Services: Patient declined    Database administrator or Organizations: Patient declined    Attends Engineer, structural: Patient declined    Marital Status: Patient declined   Family History  Family history unknown: Yes   Scheduled Meds:  baclofen  20 mg Oral QID   Chlorhexidine Gluconate Cloth  6 each Topical Daily   cyclobenzaprine  10 mg Oral BID   cyclobenzaprine  20 mg Oral Q24H   diazepam  2 mg Oral BID   enoxaparin (LOVENOX) injection  1 mg/kg Subcutaneous Q12H   Gerhardt's butt cream   Topical TID   metoprolol tartrate  25 mg Oral BID   pantoprazole (PROTONIX) IV  40 mg Intravenous Q24H   potassium chloride  40 mEq Oral Once   senna-docusate  3 tablet Oral QHS   sodium chloride flush  3 mL Intravenous Q12H   tamsulosin  0.4 mg Oral Daily   tiZANidine  2 mg Oral BID   tiZANidine  4 mg Oral Q1200   Continuous Infusions:  sodium chloride     PRN Meds:.acetaminophen **OR** acetaminophen, diazepam, diphenhydrAMINE, glycerin adult, HYDROmorphone (DILAUDID) injection, labetalol, metoprolol tartrate, ondansetron **OR** ondansetron (ZOFRAN) IV, mouth rinse, traMADol Allergies  Allergen Reactions   Betadine [Povidone Iodine] Other (See Comments)    Unknown reaction per caregiver   Ciprofloxacin Other (See Comments)    Unknown reaction per caregiver   Fentanyl Other (See Comments)    Previous reaction resulting in intubation, unclear details per family   Morphine Other (See Comments)    Unknown reaction per caregiver   CBC:    Component Value Date/Time   WBC 5.4 12/31/2022  0310   HGB 11.5 (L) 12/31/2022 0310   HCT 35.3 (L) 12/31/2022 0310   PLT 160 12/31/2022 0310   MCV 93.6 12/31/2022 0310   MCV 86.2 07/26/2015 1425   NEUTROABS 7.9 (H) 12/27/2022 0911   LYMPHSABS 0.9 12/27/2022 0911   MONOABS 0.7 12/27/2022 0911   EOSABS 0.2 12/27/2022 0911   BASOSABS 0.0 12/27/2022 0911   Comprehensive Metabolic Panel:    Component Value Date/Time   NA 138 12/31/2022 0310   K 3.3 (L) 12/31/2022 0310   CL 107 12/31/2022 0310   CO2 24 12/31/2022 0310   BUN <5 (L) 12/31/2022 0310   CREATININE 0.71 12/31/2022 0310   CREATININE 0.89 08/28/2021 0000   GLUCOSE 137 (H) 12/31/2022 0310   CALCIUM 7.3 (L) 12/31/2022 0310   AST 17 12/30/2022 0123   ALT 12 12/30/2022 0123   ALKPHOS 53 12/30/2022 0123   BILITOT 1.3 (H) 12/30/2022  0123   PROT 5.7 (L) 12/30/2022 0123   ALBUMIN 3.2 (L) 12/30/2022 0123    Physical Exam: Vital Signs: BP 113/65   Pulse 90   Temp 98.3 F (36.8 C) (Axillary)   Resp 11   Ht 5\' 5"  (1.651 m)   SpO2 92%   BMI 27.70 kg/m  SpO2: SpO2: 92 % O2 Device: O2 Device: Room Air O2 Flow Rate:   Intake/output summary:  Intake/Output Summary (Last 24 hours) at 12/31/2022 0908 Last data filed at 12/31/2022 0600 Gross per 24 hour  Intake 2831.75 ml  Output 1050 ml  Net 1781.75 ml   LBM: Last BM Date : 12/29/22 Baseline Weight:   Most recent weight:    General: NAD, alert, interactive, able to respond appropraite in broken speech, chronically ill appearing  Cardiovascular: tachycardia noted Respiratory: no increased work of breathing noted, not in respiratory distress Abdomen: not distended Neuro: A&Ox4, following commands easily Psych: appropriately answers all questions          Palliative Performance Scale: 40%              Additional Data Reviewed: Recent Labs    12/30/22 0123 12/31/22 0310  WBC 7.9 5.4  HGB 12.8* 11.5*  PLT 185 160  NA 140 138  BUN 10 <5*  CREATININE 0.95 0.71    Imaging: DG Chest Port 1 View CLINICAL DATA:   NG tube placement  EXAM: PORTABLE CHEST 1 VIEW  COMPARISON:  None Available.  FINDINGS: Limited x-ray of the lower chest and upper abdomen demonstrates NG tube with the tip overlying the left upper quadrant, likely in the fundus of the stomach. There are some distended air-filled loops of bowel elsewhere in the upper abdomen.  IMPRESSION: Enteric tube in place with tip overlying the fundus of the stomach in the left upper quadrant on this limited portable x-ray  Electronically Signed   By: Karen Kays M.D.   On: 12/29/2022 13:36 DG Abdomen 1 View CLINICAL DATA:  NG tube placement.  EXAM: ABDOMEN - 1 VIEW  COMPARISON:  One-view abdomen 12/28/2022  FINDINGS: The side port of the NG tube is just past the GE junction. Bowel gas pattern is normal.  IMPRESSION: Side port of the NG tube is just past the GE junction.  Electronically Signed   By: Marin Roberts M.D.   On: 12/29/2022 09:10 CT Angio Chest/Abd/Pel for Dissection W and/or W/WO CLINICAL DATA:  Abdominal pain with abnormal CT abdomen and pelvis yesterday with portal and mesenteric venous gas.  EXAM: CT ANGIOGRAPHY CHEST, ABDOMEN AND PELVIS  TECHNIQUE: Noncontrast CT of the chest was initially obtained.  Multidetector CT imaging through the chest, abdomen and pelvis was performed using the standard protocol during bolus administration of intravenous contrast. Multiplanar reconstructed images and MIPs were obtained and reviewed to evaluate the vascular anatomy.  RADIATION DOSE REDUCTION: This exam was performed according to the departmental dose-optimization program which includes automated exposure control, adjustment of the mA and/or kV according to patient size and/or use of iterative reconstruction technique.  CONTRAST:  OMNIPAQUE IOHEXOL 350 MG/ML SOLN  COMPARISON:  Portable chest 12/27/2022, CTA chest 03/09/2010, CT abdomen and pelvis yesterday at 11:26 p.m. with contrast, CT  abdomen pelvis 12/27/2022 with contrast, and CT abdomen and pelvis with IV contrast 05/14/2021.  FINDINGS: CTA CHEST FINDINGS  Cardiovascular: No arterial embolus is seen through the segmental level.  The pulmonary arteries are normal in caliber. There is mild cardiomegaly with a left chamber predominance.  No  pulmonary venous dilatation is seen. No pericardial effusion. The aorta and great vessels are normal caliber.  There is trace calcific plaque of the distal aortic arch. There is no aneurysm, dissection or stenosis.  Mediastinum/Nodes: No enlarged mediastinal, hilar, or axillary lymph nodes. Thyroid gland, trachea, and esophagus demonstrate no significant findings.  Lungs/Pleura: Trace right pleural effusion. No left pleural fluid. Mild posterior atelectasis both lungs. No consolidation or nodules. No pneumothorax. Diffuse bronchial thickening.  Musculoskeletal: Mild-to-moderate thoracic dextroscoliosis. 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  No acute or other significant chest osseous findings. Subareolar gynecomastia.  Review of the MIP images confirms the above findings.  CTA ABDOMEN AND PELVIS FINDINGS  VASCULAR  Aorta: Normal.  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 Slight poststenotic ectasia with otherwise normal vessel opacification. No branch vessel occlusion.  SMA: Patent without evidence of aneurysm, dissection, vasculitis or significant stenosis. No branch occlusion is seen.  Renals: Both are single. Both are unremarkable. No branch occlusion is seen.  IMA: Normal.  Inflow: Patent without evidence of aneurysm, dissection, vasculitis or significant stenosis. Once again the left common iliac and external iliac arteries are asymmetrically larger.  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  Again noted is mesenteric venous gas in the left abdomen posterior to the stomach.  Review of the MIP images confirms the above findings.  NON-VASCULAR  Hepatobiliary: There are small scattered hepatic cysts, and again noted portal venous gas. There are stones within a mildly dilated but otherwise unremarkable gallbladder without wall thickening. No biliary dilatation.  Pancreas: No abnormality.  Spleen: No abnormality.  Adrenals/Urinary Tract: Small renal cysts. No follow-up imaging recommended.  No adrenal mass. No evidence of obstructive uropathy or stones. Unremarkable bladder.  Stomach/Bowel: Stomach is distended with air and fluid. Query wall pneumatosis posteriorly, seen on the recent CT but not the earlier studies.  No small bowel dilatation is seen. There is fluid in the colon and colonic dilatation up to 8 cm.  There is no visible bowel pneumatosis. 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  This is not associated with wall thickening or inflammatory change. An appendix is not seen.  Lymphatic: No adenopathy is evident.  Reproductive: Enlarged prostate, 5.6 cm.  Other: No free air or fluid.  No incarcerated hernia.  Musculoskeletal: No acute or significant osseous findings.  Review of the MIP images confirms the above findings.  IMPRESSION: 1. No evidence of pulmonary arterial embolus or aortic dissection. 2. Trace right pleural effusion. 3. Diffuse bronchial thickening.  No focal infiltrate. 4. Mild cardiomegaly. 5. 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 No SMA stenosis or branch vessel occlusion. 6. 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 7. 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 8. Mesenteric venous gas in the left abdomen posterior to the stomach. Query gastric wall pneumatosis posteriorly, seen on the recent CT but not the earlier studies. Wall ischemia not excluded.  9. Fluid in the colon and colonic dilatation up to 8 cm. No visible bowel pneumatosis. 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 10. Cholelithiasis. 11. Enlarged prostate. 12. Subareolar gynecomastia. 13. Aortic atherosclerosis.  Aortic Atherosclerosis (ICD10-I70.0).  Electronically Signed   By: Almira Bar M.D.   On: 12/29/2022 02:24 CT ABDOMEN PELVIS W CONTRAST CLINICAL DATA:  Abdominal pain, acute, nonlocalized hx SBO, worsening pain Pt. BIB [facility] for abdominal pain x1 day. States that the last few hours his pain has been unbearable. Hx of cerebral palsy and SBO. Per EMS pt. Belched and stated that he felt better.  EXAM: CT ABDOMEN AND PELVIS WITH CONTRAST  TECHNIQUE: Multidetector CT imaging of the abdomen and pelvis was performed using the standard protocol following bolus administration of intravenous contrast.  RADIATION DOSE REDUCTION: This exam was performed according to the departmental dose-optimization program which includes automated exposure control, adjustment of the mA and/or kV according to patient size and/or use of iterative reconstruction technique.  CONTRAST:  OMNIPAQUE IOHEXOL 300 MG/ML  SOLN  COMPARISON:  CT abdomen pelvis 12/27/2022  FINDINGS: Lower chest: No acute abnormality.  Hepatobiliary: Subcentimeter hypodensities too small. Otherwise no focal liver abnormality. Calcified gallstone noted within the gallbladder lumen. No gallbladder wall  thickening or pericholecystic fluid. No biliary dilatation.  Pancreas: No focal lesion. Normal pancreatic contour. No surrounding inflammatory changes. No main pancreatic ductal dilatation.  Spleen: Normal in size without focal abnormality.  Adrenals/Urinary Tract:  No adrenal nodule bilaterally.  Bilateral kidneys enhance symmetrically. Subcentimeter hypodensities are too small to characterize-no further follow-up indicated.  No hydronephrosis. No hydroureter.  The urinary bladder is unremarkable.  Stomach/Bowel: Pneumatosis of the gastric fundus is not fully excluded (11:13). Interval resolution of small bowel dilatation. No evidence of bowel wall thickening. Fluid within the lumen of the colon. Stool noted within the rectum. Appendix appears normal.  Vascular/Lymphatic: Interval development of portal venous gas and mesenteric venous gas. 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 No abdominal aorta or iliac aneurysm. No abdominal, pelvic, or inguinal lymphadenopathy.  Reproductive: Prostate is enlarged measuring up to 5.8 cm  Other: No intraperitoneal free fluid. No intraperitoneal free gas. No organized fluid collection.  Musculoskeletal:  No abdominal wall hernia or abnormality. Persistent left hip subcutaneus soft tissue edema.  No suspicious lytic or blastic osseous lesions. No acute displaced fracture.  IMPRESSION: 1. Resolved small and large bowel dilatation with persistent diarrheal state; however, interval development of portal venous and mesenteric venous gas concerning for sequelae of associated ischemia/necrosis. Pneumatosis of the gastric fundus is not fully excluded. Recommend trending of lactate levels. No bowel perforation. 2. Cholelithiasis with no CT evidence  of acute cholecystitis. 3. Prostatomegaly. 4. 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  These results were called by telephone at the time of interpretation on 12/29/2022 at 12:24 am to provider [facility] , who verbally acknowledged these results.  Electronically Signed   By: Tish Frederickson M.D.   On: 12/29/2022 00:29    I personally reviewed recent imaging.   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 During hospitalization, patient has received management chronic abdominal pain and chronic constipation. GI consulted for recommendations. Palliative medicine team consulted to assist with complex medical decision making.   # Complex medical decision making/goals of care  -Discussed care with patient as detailed above in HPI. Patient has been dealing with chronic illness since 7. He feels currently that returning to the hospital if needed benefits his overall care and so he wants to return to the hospital if needed.   -  Code Status: Full Code    -With patient's permission, did introduce the concept of full code VS DNR, continue appropraite medical interventions. Expressed concern that if patient were sick enough his heart stopped or his stopped breathing, interventions such as cardiac resuscitation and intubation and mechanical intubation would likely not lead to a good quality of life outcome with his underlying  medical conditions. Patient acknowledged this and noted he would consider if he wants to change his code status. Stated he can let a provider know if he wants to change this.   # Discharge Planning:   Home  Thank you for allowing the palliative care team to participate in the care [facility]. Please reach out if further PMT needs arise.    Alvester Morin, DO Palliative Care Provider PMT # [phone]  If patient remains symptomatic despite maximum doses, please call PMT at [phone] between 0700 and 1900. Outside of these hours, please call attending, as PMT does not have night coverage.  Personally spent 80 minutes in patient care including extensive chart review (labs, imaging, progress/consult notes, vital signs), medically appropraite exam, discussed with treatment team, education to patient, family, and staff, documenting clinical information, medication review and management, coordination of care, and available advanced directive documents.    *Please note that this is a verbal dictation therefore any spelling or grammatical errors are due to the "[employer]" system interpretation.

## 2022-12-31 NOTE — Progress Notes (Signed)
PROGRESS NOTE [name]  HQI:[id number] DOB: July 28, 1980 DOA: 12/28/2022 PCP: Fleet Contras, MD  Brief Narrative/Hospital Course: 42 y.o. male with medical history significant for cerebral palsy/spasticity, chronic constipation/SBO history hx of ExLap LOA 2019, DVT and PAF on warfarin, SBO, and recent admission abdominal pain and N/V suspected secondary to ileus was discharged on 11/30, now returns with recurrent abdominal pain. In the ED vitals stable afebrile labs mostly normal including lactic acid. 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 Dr. Donell Beers from surgery was consulted NGT was placed and admitted for further management. 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 x-ray is unremarkable abdomen exam benign GI advised to remove NG tube start bowel regimen and titrate MiraLAX to have 1-2 bowel movement daily   Subjective: Seen and examined, Overnight afebrile BP stable on RA Labs ok Answers questions, no pain Having Bms   Assessment and Plan: Principal Problem:   Ogilvie syndrome Active Problems:   Infantile cerebral palsy (HCC)   Atrial fibrillation (HCC)   Hypertension   History of DVT (deep vein thrombosis)   Abdominal pain   Pain in the abdomen   Adynamic ileus w/ pain-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   Initially on NG tube and removed, antibiotic discontinued.  Managing symptomatically for Ogilvie syndrome -titrate MiraLAX to have BM 1-2 daily. ADAT-with his positioning difficulty with feeding cont ivf.  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  Continue Metoprolol. Recent Labs  Lab 12/27/22 0911 12/28/22 0544 12/28/22 2253  INR 1.6* 1.9* 1.8*   Urine retention: Continue Flomax, In-N-Out cath as needed, monitor postvoid residual  Metabolic acidosis: Patient bicarb low at 15 this morning but w/ normal lactic acid  Mild hyperglycemia: Resolved  Hypokalemia : Replace   Sinus tachycardia Hypertension: BP stable, cont prn lopressor.  Continue IV fluids  Cerebral palsy/spasticity: Supportive care. PTA on Flexeril/tizanidine/baclofen- continue same.  Cholelithiasis: Monitor as outpatient  Goals of care: Patient expressed that he is tired of recurrent admissions, with his poor baseline health at this time consulted palliative care  DVT prophylaxis: heparin. Code Status:   Code Status: Full Code Family Communication: plan of care discussed with patient at bedside. Patient status is: Inpatient because of chronic constipation with ileus and abdominal pain Level of care: Progressive   Dispo: The patient is from: home            Anticipated disposition: Anticipate discharge home tomorrow awaiting palliative input   Objective: Vitals last 24 hrs: Vitals:   12/31/22 0400 12/31/22 0500 12/31/22 0600 12/31/22 0800  BP: 107/60 110/61 113/65   Pulse:      Resp: 13 11    Temp:    98.3 F (36.8 C)  TempSrc:    Axillary  SpO2:  Height:       Weight change:   Physical Examination: General exam: alert awake, layiong on  bed, baseline speech difficulties able to communicate  HEENT:Oral mucosa moist, Ear/Nose WNL grossly Respiratory system: Bilaterally clear BS,no use of accessory muscle Cardiovascular system: S1 & S2 +, No JVD. Gastrointestinal system: Abdomen soft,NT,ND, BS+ Nervous System: Alert, awake, moving all extremities,and following commands. Extremities: LE edema neg,distal peripheral pulses palpable and warm.  Skin: No rashes,no icterus. MSK: Normal muscle bulk,tone, power   Medications reviewed:  Scheduled Meds:  baclofen  20 mg Oral QID   Chlorhexidine Gluconate Cloth  6 each Topical Daily   cyclobenzaprine  10 mg Oral BID   cyclobenzaprine  20 mg Oral Q24H   diazepam  2 mg Oral BID   enoxaparin (LOVENOX) injection  1 mg/kg Subcutaneous Q12H   Gerhardt's butt cream   Topical TID   metoprolol tartrate  25 mg Oral BID   pantoprazole (PROTONIX) IV  40 mg Intravenous Q24H   potassium chloride  40 mEq Oral Once   senna-docusate  3 tablet Oral QHS   sodium chloride flush  3 mL Intravenous Q12H   tamsulosin  0.4 mg Oral Daily   tiZANidine  2 mg Oral BID   tiZANidine  4 mg Oral Q1200   Continuous Infusions:  sodium chloride       Diet Order             Diet clear liquid Room service appropriate? Yes; Fluid consistency: Nectar Thick  Diet effective now                  Intake/Output Summary (Last 24 hours) at 12/31/2022 1030 Last data filed at 12/31/2022 0600 Gross per 24 hour  Intake 2831.75 ml  Output 1050 ml  Net 1781.75 ml   Net IO Since Admission: 3,604.15 mL [12/31/22 1030]  Wt Readings from Last 3 Encounters:  12/28/22 75.5 kg  02/04/22 77 kg  01/25/22 77.1 kg     Unresulted Labs (From admission, onward)     Start     Ordered   01/01/23 0500  Protime-INR  Daily,   R     Question:  Specimen collection method  Answer:  Lab=Lab collect   12/31/22 0858   12/31/22 0858  Protime-INR  Once,   R       Question:  Specimen collection method  Answer:  Lab=Lab collect    12/31/22 0857   12/30/22 0500  Basic metabolic panel  Daily,   R      12/29/22 0533   12/30/22 0500  CBC  Daily,   R     Comments: Daily while on heparin drip    12/29/22 0651          Data Reviewed: I have personally reviewed following labs and imaging studies CBC: Recent Labs  Lab 12/27/22 0911 12/27/22 1041 12/28/22 0544 12/28/22 2253 12/29/22 1522 12/30/22 0123 12/31/22 0310  WBC 9.7  --  5.6 7.4 7.9 7.9 5.4  NEUTROABS 7.9*  --   --   --   --   --   --   HGB 15.3   < > 13.4 13.9 12.8* 12.8* 11.5*  HCT 47.3   < > 40.1 42.7 40.0 41.3 35.3*  MCV 94.8  --  92.6 94.1 97.8 98.1 93.6  PLT 212  --  177 196 162 185 160   < > = values in this interval not displayed.   Basic Metabolic Panel:  Recent  Labs  Lab 12/28/22 1026 12/28/22 2253 12/29/22 1522 12/30/22 0123 12/31/22 0310  NA 138 142 140 140 138  K 3.9 4.1 3.6 3.6 3.3*  CL 108 108 110 108 107  CO2 23 26 19* 15* 24  GLUCOSE 110* 110* 91 67* 137*  BUN 9 8 10 10  <5*  CREATININE 0.88 0.94 0.90 0.95 0.71  CALCIUM 7.9* 8.5* 7.8* 7.9* 7.3*  MG 1.7  --   --   --   --    GFR: Estimated Creatinine Clearance: 113 mL/min (by C-G formula based on SCr of 0.71 mg/dL). Liver Function Tests:  Recent Labs  Lab 12/27/22 0911 12/28/22 1026 12/28/22 2253 12/29/22 1522 12/30/22 0123  AST 21 18 19 18 17   ALT 15 10 12 12 12   ALKPHOS 62 51 64 52 53  BILITOT 1.3* 1.0 1.2* 1.6* 1.3*  PROT 7.1 5.8* 6.9 5.6* 5.7*  ALBUMIN 3.9 3.2* 3.7 3.1* 3.2*   Recent Labs  Lab 12/27/22 0911 12/28/22 2253  LIPASE 26 27   No results for input(s): "AMMONIA" in the last 168 hours. Coagulation Profile:  Recent Labs  Lab 12/27/22 0911 12/28/22 0544 12/28/22 2253  INR 1.6* 1.9* 1.8*   Recent Labs  Lab 12/28/22 0743 12/30/22 0656 12/30/22 1238 12/30/22 2004 12/31/22 0743  GLUCAP 102* 58* 109* 112* 125*   Sepsis Labs: Recent Labs  Lab 12/29/22 0149 12/29/22 0841 12/29/22 1522 12/30/22 0902  LATICACIDVEN 1.0 1.2 1.1 1.5     Recent Results (from the past 240 hour(s))  Resp panel by RT-PCR (RSV, Flu A&B, Covid) Anterior Nasal Swab     Status: None   Collection Time: 12/27/22  9:39 AM   Specimen: Anterior Nasal Swab  Result Value Ref Range Status   SARS Coronavirus 2 by RT PCR NEGATIVE NEGATIVE Final    Comment: (NOTE) SARS-CoV-2 target nucleic acids are NOT DETECTED.  The SARS-CoV-2 RNA is generally detectable in upper respiratory specimens during the acute phase of infection. The lowest concentration of SARS-CoV-2 viral copies this assay can detect is 138 copies/mL. A negative result does not preclude SARS-Cov-2 infection and should not be used as the sole basis for treatment or other patient management decisions. A negative result may occur with  improper specimen collection/handling, submission of specimen other than nasopharyngeal swab, presence of viral mutation(s) within the areas targeted by this assay, and inadequate number of viral copies(<138 copies/mL). A negative result must be combined with clinical observations, patient history, and epidemiological information. The expected result is Negative.  Fact Sheet for Patients:  BloggerCourse.com  Fact Sheet for Healthcare Providers:  SeriousBroker.it  This test is no t yet approved or cleared by the [place] FDA and  has been authorized for detection and/or diagnosis of SARS-CoV-2 by FDA under an Emergency Use Authorization (EUA). This EUA will remain  in effect (meaning this test can be used) for the duration of the COVID-19 declaration under Section 564(b)(1) of the Act, 21 U.S.C.section 360bbb-3(b)(1), unless the authorization is terminated  or revoked sooner.       Influenza A by PCR NEGATIVE NEGATIVE Final   Influenza B by PCR NEGATIVE NEGATIVE Final    Comment: (NOTE) The Xpert Xpress SARS-CoV-2/FLU/RSV plus assay is intended as an aid in the diagnosis of influenza from  Nasopharyngeal swab specimens and should not be used as a sole basis for treatment. Nasal washings and aspirates are unacceptable for Xpert Xpress SARS-CoV-2/FLU/RSV testing.  Fact Sheet for Patients: BloggerCourse.com  Fact Sheet for Healthcare Providers:  SeriousBroker.it  This test is not yet approved or cleared by the [place] and has been authorized for detection and/or diagnosis of SARS-CoV-2 by FDA under an Emergency Use Authorization (EUA). This EUA will remain in effect (meaning this test can be used) for the duration of the COVID-19 declaration under Section 564(b)(1) of the Act, 21 U.S.C. section 360bbb-3(b)(1), unless the authorization is terminated or revoked.     Resp Syncytial Virus by PCR NEGATIVE NEGATIVE Final    Comment: (NOTE) Fact Sheet for Patients: BloggerCourse.com  Fact Sheet for Healthcare Providers: SeriousBroker.it  This test is not yet approved or cleared by the [place] FDA and has been authorized for detection and/or diagnosis of SARS-CoV-2 by FDA under an Emergency Use Authorization (EUA). This EUA will remain in effect (meaning this test can be used) for the duration of the COVID-19 declaration under Section 564(b)(1) of the Act, 21 U.S.C. section 360bbb-3(b)(1), unless the authorization is terminated or revoked.  Performed at [facility], [address], [place], [place] [address]   Blood Culture (routine x 2)     Status: None (Preliminary result)   Collection Time: 12/27/22 10:35 AM   Specimen: BLOOD  Result Value Ref Range Status   Specimen Description   Final    BLOOD SITE NOT SPECIFIED Performed at [facility], [address] [place], [place] [address]    Special Requests   Final    BOTTLES DRAWN AEROBIC AND ANAEROBIC Blood Culture results may not be optimal due to an inadequate  volume of blood received in culture bottles Performed at [facility], [address] [place], [place] [address]    Culture   Final    NO GROWTH 4 DAYS Performed at [facility], [address], [place], [place] [address]    Report Status PENDING  Incomplete  Blood Culture (routine x 2)     Status: None (Preliminary result)   Collection Time: 12/27/22  7:15 PM   Specimen: BLOOD  Result Value Ref Range Status   Specimen Description   Final    BLOOD BLOOD LEFT HAND Performed at [facility], [address] [place], [place] [address]    Special Requests   Final    BOTTLES DRAWN AEROBIC AND ANAEROBIC Blood Culture adequate volume Performed at [facility], [address] [place], [place] [address]    Culture   Final    NO GROWTH 4 DAYS Performed at [facility], [address], [place], [place] [address]    Report Status PENDING  Incomplete  Culture, blood (routine x 2)     Status: None (Preliminary result)   Collection Time: 12/29/22  4:40 AM   Specimen: BLOOD RIGHT HAND  Result Value Ref Range Status   Specimen Description   Final    BLOOD RIGHT HAND Performed at [facility], [address], [place], [place] [address]    Special Requests   Final    Blood Culture results may not be optimal due to an inadequate volume of blood received in culture bottles BOTTLES DRAWN AEROBIC AND ANAEROBIC Performed at [facility], [address], [place], [place] [address]    Culture   Final    NO GROWTH 2 DAYS Performed at [facility], [address] [place], [place] [address]    Report Status PENDING  Incomplete  Culture, blood (routine x 2)  Status: None (Preliminary result)   Collection Time: 12/29/22  3:22 PM   Specimen: BLOOD  Result Value Ref Range Status   Specimen Description   Final    BLOOD BLOOD RIGHT HAND Performed at [facility], [address], [place], [place] [address]    Special Requests   Final    BOTTLES DRAWN AEROBIC AND ANAEROBIC Blood Culture results may not be optimal due to an inadequate volume of blood received in culture bottles Performed at [facility], [address], [place], [place] [address]    Culture   Final    NO GROWTH 2 DAYS Performed at [facility], [address] [place], [place] [address]    Report Status PENDING  Incomplete  MRSA Next Gen by PCR, Nasal     Status: None   Collection Time: 12/29/22  3:47 PM   Specimen: Nasal Mucosa; Nasal Swab  Result Value Ref Range Status   MRSA by PCR Next Gen NOT DETECTED NOT DETECTED Final    Comment: (NOTE) The GeneXpert MRSA Assay (FDA approved for NASAL specimens only), is one component of a comprehensive MRSA colonization surveillance program. It is not intended to diagnose MRSA infection nor to guide or monitor treatment for MRSA infections. Test performance is not FDA approved in patients less than 65 years old. Performed at [facility], [address], [place], [place] [address]     Antimicrobials: Anti-infectives (From admission, onward)    Start     Dose/Rate Route Frequency Ordered Stop   12/29/22 1800  vancomycin (VANCOCIN) IVPB 1000 mg/200 mL premix  Status:  Discontinued        1,000 mg 200 mL/hr over 60 Minutes Intravenous Every 12 hours 12/29/22 0503 12/29/22 0533   12/29/22 1200  piperacillin-tazobactam (ZOSYN) IVPB 3.375 g  Status:  Discontinued        3.375 g 12.5 mL/hr over 240 Minutes Intravenous Every 8 hours 12/29/22 0456 12/30/22 1016   12/29/22 0415  vancomycin (VANCOCIN) IVPB 1000 mg/200 mL premix        1,000 mg 200 mL/hr over 60 Minutes Intravenous  Once 12/29/22 0407 12/29/22 0610   12/29/22 0415  piperacillin-tazobactam (ZOSYN) IVPB 3.375 g        3.375 g 100 mL/hr over 30 Minutes Intravenous  Once 12/29/22 0407 12/29/22 0500      Culture/Microbiology    Component Value  Date/Time   SDES  12/29/2022 1522    BLOOD BLOOD RIGHT HAND Performed at [facility], [address], [place], [place] [address]    SPECREQUEST  12/29/2022 1522    BOTTLES DRAWN AEROBIC AND ANAEROBIC Blood Culture results may not be optimal due to an inadequate volume of blood received in culture bottles Performed at [facility], [address], [place], [place] [address]    CULT  12/29/2022 1522    NO GROWTH 2 DAYS Performed at [facility], [address] [place], [place] [address]    REPTSTATUS PENDING 12/29/2022 1522    Radiology Studies: [facility] Chest Port 1 View  Result Date: 12/29/2022 CLINICAL DATA:  NG tube placement EXAM: PORTABLE CHEST 1 VIEW COMPARISON:  None Available. FINDINGS: Limited x-ray of the lower chest and upper abdomen demonstrates NG tube with the tip overlying the left upper quadrant, likely in the fundus of the stomach. There are some distended air-filled loops of bowel elsewhere in the upper abdomen. IMPRESSION: Enteric tube in place  with tip overlying the fundus of the stomach in the left upper quadrant on this limited portable x-ray Electronically Signed   By: Karen Kays M.D.   On: 12/29/2022 13:36     LOS: 2 days   Total time spent in review of labs and imaging, patient evaluation, formulation of plan, documentation and communication with family: 35 minutes  Lanae Boast, MD [facility]  12/31/2022, 10:30 AM

## 2022-12-31 NOTE — Progress Notes (Signed)
PHARMACY - ANTICOAGULATION CONSULT NOTE  Pharmacy Consult for Warfarin Indication: PTA warfarin for Hx Afib and DVT  Allergies  Allergen Reactions   Betadine [Povidone Iodine] Other (See Comments)    Unknown reaction per caregiver   Ciprofloxacin Other (See Comments)    Unknown reaction per caregiver   Fentanyl Other (See Comments)    Previous reaction resulting in intubation, unclear details per family   Morphine Other (See Comments)    Unknown reaction per caregiver    Patient Measurements: Height: 5\' 5"  (165.1 cm) Weight: 75.5 kg IBW/kg (Calculated) : 61.5 Heparin Dosing Weight: total body weight   Vital Signs: Temp: 98.3 F (36.8 C) (12/03 0800) Temp Source: Axillary (12/03 0800) BP: 113/65 (12/03 0600) Pulse Rate: 90 (12/03 0200)  Labs: Recent Labs    12/28/22 2253 12/29/22 1522 12/29/22 1755 12/30/22 0123 12/30/22 0829 12/31/22 0310  HGB 13.9 12.8*  --  12.8*  --  11.5*  HCT 42.7 40.0  --  41.3  --  35.3*  PLT 196 162  --  185  --  160  LABPROT 20.9*  --   --   --   --   --   INR 1.8*  --   --   --   --   --   HEPARINUNFRC  --  0.12* 0.20* 0.40 0.21*  --   CREATININE 0.94 0.90  --  0.95  --  0.71    Estimated Creatinine Clearance: 113 mL/min (by C-G formula based on SCr of 0.71 mg/dL).   Medical History: Past Medical History:  Diagnosis Date   Cerebral palsy (HCC)    Chronic constipation    Coagulopathy (HCC)    DVT (deep venous thrombosis) (HCC)    Dysrhythmia    Hypertension    Insomnia    Pneumonia    Recurrent UTI    Tachycardia     Assessment: 61 yoM presents to ED on 11/29 with distended abdomen, vomiting, possible partial bowel obstruction vs ileus d/t constipation. 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 Warfarin was held at admission d/t NPO in case surgical intervention needed. He was transitioned from Heparin IV to Lovenox on 12/2.  Pharmacy is now consulted to resume warfarin dosing on  12/3.    Today, 12/31/22: Remains on full dose Lovenox bridge INR check today first result 4.1, repeat remains supratherapeutic at 3.6 Last warfarin dose charted at 11/30 prior to admission Unclear reason for elevated INR without warfarin doses administered or liver dysfunction.  Suspect INR elevation is NOT due to true anticoagulation, so will continue Lovenox at this time.   CBC:  Hgb down to 11.5, plts 160 No bleeding per RN Clear liquid diet No major drug-drug interactions noted.  Zosyn given 12/1-12/2.  Goal of Therapy:  INR 2-3  Monitor platelets by anticoagulation protocol: Yes   Plan:  Continue Lovenox bridge 1 mg/kg SQ q12h  No warfarin dose today Daily PT/INR, CBC    Lynann Beaver PharmD, BCPS [facility] main pharmacy [phone] 12/31/2022 8:53 AM

## 2023-01-01 DIAGNOSIS — K56600 Partial intestinal obstruction, unspecified as to cause: Secondary | ICD-10-CM | POA: Diagnosis not present

## 2023-01-01 DIAGNOSIS — B999 Unspecified infectious disease: Secondary | ICD-10-CM

## 2023-01-01 DIAGNOSIS — K5981 Ogilvie syndrome: Secondary | ICD-10-CM | POA: Diagnosis not present

## 2023-01-01 LAB — CULTURE, BLOOD (ROUTINE X 2)
Culture: NO GROWTH
Culture: NO GROWTH
Special Requests: ADEQUATE

## 2023-01-01 LAB — CBC
HCT: 38 % — ABNORMAL LOW (ref 39.0–52.0)
Hemoglobin: 12 g/dL — ABNORMAL LOW (ref 13.0–17.0)
MCH: 30.3 pg (ref 26.0–34.0)
MCHC: 31.6 g/dL (ref 30.0–36.0)
MCV: 96 fL (ref 80.0–100.0)
Platelets: 172 10*3/uL (ref 150–400)
RBC: 3.96 MIL/uL — ABNORMAL LOW (ref 4.22–5.81)
RDW: 14.6 % (ref 11.5–15.5)
WBC: 4.3 10*3/uL (ref 4.0–10.5)
nRBC: 0 % (ref 0.0–0.2)

## 2023-01-01 LAB — PROTIME-INR
INR: 3.1 — ABNORMAL HIGH (ref 0.8–1.2)
Prothrombin Time: 31.8 s — ABNORMAL HIGH (ref 11.4–15.2)

## 2023-01-01 LAB — GLUCOSE, CAPILLARY
Glucose-Capillary: 108 mg/dL — ABNORMAL HIGH (ref 70–99)
Glucose-Capillary: 114 mg/dL — ABNORMAL HIGH (ref 70–99)
Glucose-Capillary: 89 mg/dL (ref 70–99)
Glucose-Capillary: 93 mg/dL (ref 70–99)
Glucose-Capillary: 93 mg/dL (ref 70–99)

## 2023-01-01 LAB — BASIC METABOLIC PANEL
Anion gap: 8 (ref 5–15)
BUN: <5 mg/dL — ABNORMAL LOW (ref 6–20)
CO2: 24 mmol/L (ref 22–32)
Calcium: 7.9 mg/dL — ABNORMAL LOW (ref 8.9–10.3)
Chloride: 110 mmol/L (ref 98–111)
Creatinine, Ser: 0.63 mg/dL (ref 0.61–1.24)
GFR, Estimated: >60 mL/min (ref >60)
Glucose, Bld: 95 mg/dL (ref 70–99)
Potassium: 3.8 mmol/L (ref 3.5–5.1)
Sodium: 142 mmol/L (ref 135–145)

## 2023-01-01 MED ORDER — PANTOPRAZOLE SODIUM 40 MG PO TBEC
40.0000 mg | DELAYED_RELEASE_TABLET | Freq: Every day | ORAL | Status: DC
  Administered 2023-01-02: 40 mg via ORAL
  Filled 2023-01-01: qty 1

## 2023-01-01 MED ORDER — ORAL CARE MOUTH RINSE
15.0000 mL | OROMUCOSAL | Status: DC
  Administered 2023-01-01: 15 mL via OROMUCOSAL

## 2023-01-01 MED ORDER — POLYETHYLENE GLYCOL 3350 17 G PO PACK
17.0000 g | PACK | Freq: Two times a day (BID) | ORAL | Status: DC
  Administered 2023-01-01 – 2023-01-02 (×3): 17 g via ORAL
  Filled 2023-01-01 (×3): qty 1

## 2023-01-01 MED ORDER — ORAL CARE MOUTH RINSE: 15.0000 mL | OROMUCOSAL | Status: DC | PRN

## 2023-01-01 MED ORDER — WARFARIN SODIUM 3 MG PO TABS
3.0000 mg | ORAL_TABLET | Freq: Every day | ORAL | Status: AC
  Administered 2023-01-01: 3 mg via ORAL
  Filled 2023-01-01: qty 1

## 2023-01-01 MED ORDER — METOPROLOL TARTRATE 25 MG/10 ML ORAL SUSPENSION
50.0000 mg | Freq: Two times a day (BID) | ORAL | Status: DC
  Administered 2023-01-01: 50 mg via ORAL
  Filled 2023-01-01 (×2): qty 20

## 2023-01-01 MED ORDER — WARFARIN - PHARMACIST DOSING INPATIENT: Freq: Every day | Status: DC

## 2023-01-01 NOTE — Progress Notes (Signed)
PHARMACY - ANTICOAGULATION CONSULT NOTE  Pharmacy Consult for Coumadin Indication:  afib, DVT  Allergies  Allergen Reactions   Betadine [Povidone Iodine] Other (See Comments)    Unknown reaction per caregiver   Ciprofloxacin Other (See Comments)    Unknown reaction per caregiver   Fentanyl Other (See Comments)    Previous reaction resulting in intubation, unclear details per family   Morphine Other (See Comments)    Unknown reaction per caregiver    Patient Measurements: Height: 5\' 5"  (165.1 cm) Weight: 74 kg (163 lb 2.3 oz) IBW/kg (Calculated) : 61.5  Vital Signs: Temp: 97.8 F (36.6 C) (12/04 0442) Temp Source: Oral (12/04 0442) BP: 105/66 (12/04 0442) Pulse Rate: 79 (12/04 0442)  Labs: Recent Labs    12/29/22 1755 12/30/22 0123 12/30/22 0829 12/31/22 0310 12/31/22 0931 12/31/22 1258 01/01/23 0543  HGB  --  12.8*  --  11.5*  --   --  12.0*  HCT  --  41.3  --  35.3*  --   --  38.0*  PLT  --  185  --  160  --   --  172  LABPROT  --   --   --   --  40.0* 36.4* 31.8*  INR  --   --   --   --  4.1* 3.6* 3.1*  HEPARINUNFRC 0.20* 0.40 0.21*  --   --   --   --   CREATININE  --  0.95  --  0.71  --   --  0.63    Estimated Creatinine Clearance: 112 mL/min (by C-G formula based on SCr of 0.63 mg/dL).   Medical History: Past Medical History:  Diagnosis Date   Cerebral palsy (HCC)    Chronic constipation    Coagulopathy (HCC)    DVT (deep venous thrombosis) (HCC)    Dysrhythmia    Hypertension    Insomnia    Pneumonia    Recurrent UTI    Tachycardia     Assessment: AC/Heme: IV Heparin (PTA warfarin) hx Afib & recurrent DVT>12/3 resume Warfarin ordered. (Last dose 11/30) - Admit INR 1.8 on warfarin 3mg  daily - 12/2: IV heparin>Lovenox - 12/3: INR 4.1, repeat 3.6? (No Coumadin in 3 days), con't Lovenox. No interacting medications noted - 12/4: INR 3.1, d/c Lovenox  Goal of Therapy:  INR 2-3 Monitor platelets by anticoagulation protocol: Yes   Plan:  D/c  Lovenox Resume Coumadin 3mg  po daily Daily INR   Tanveer Brammer S. Merilynn Finland, PharmD, BCPS Clinical Staff Pharmacist [email or web address] Merilynn Finland, Sabian Kuba Stillinger 01/01/2023,7:32 AM

## 2023-01-01 NOTE — Progress Notes (Signed)
[facility] PROGRESS NOTE    Progress Note  [name]  ZOX:[id number] DOB: 11/17/1980 DOA: 12/28/2022 PCP: Fleet Contras, MD     Brief Narrative:   [name] is an 42 y.o. male past medical history of show cerebral palsy, spasticity, chronic constipation, history of SBO status post pleura Torrey laparotomy in 2019, DVT and paroxysmal atrial fibrillation on Coumadin recently discharged from the hospital 12/28/2022 for an ileus returns with recurrent abdominal pain, CT scan of the abdomen and pelvis resolution of bowel dilation there was a concern for interval development of portal venous mesenteric gas, no PE, general surgery was consulted NG tube was placed and signed off as no surgical intervention needed.  GI was consulted on 12/30/2018 4 GI was concerned about Ogilvie syndrome they recommended to discontinue NG tube and start a bowel regiment MiraLAX twice a day.   Assessment/Plan:   Adynamic ileus with chronic pain/  Ogilvie syndrome/chronic constipation: Seen by general surgery no need for further intervention. GI was consulted recommended bowel regimen and abdominal exam is benign. Initially he required an NG tube which was eventually removed and antibiotics discontinued. Now being managed symptomatically with MiraLAX and titrate for 1-2 bowel movements a day. He is to have a bowel movement continue to titrate MiraLAX to allow 1-2 bowel movements a day.  History of DVT/paroxysmal atrial fibrillation: On long-term Coumadin INR supratherapeutic goal 2-3. Still mildly tachycardic increase metoprolol continue to monitor titrate for heart rate less than 100.  Urinary retention: Continue Flomax, in and out as needed.  Metabolic acidosis: Due to lactic acidosis now resolved.  Hyperglycemia: Resolved.  Hypokalemia: Repleted orally now resolved.  Sinus tachycardia/hypertension: Continue Lopressor.  Cerebral palsy: Continue supportive care.  Goals of  care: Patient expressed he is tired of recurrent admission his baseline is poor. Palliative care was consulted he remains a full code   Infantile cerebral palsy (HCC)   DVT prophylaxis: coumadin Family Communication: Ogilvie syndrome Status is: Inpatient Remains inpatient appropriate because: Ogilvie syndrome    Code Status:     Code Status Orders  (From admission, onward)           Start     Ordered   12/29/22 0532  Full code  Continuous       Question:  By:  Answer:  Consent: discussion documented in EHR   12/29/22 0533           Code Status History     Date Active Date Inactive Code Status Order ID Comments User Context   12/27/2022 1405 12/28/2022 2205 Full Code [id number]  Joycelyn Das, MD ED   05/14/2021 1813 05/18/2021 2225 Full Code [id number]  Teddy Spike, DO Inpatient   02/16/2019 1934 02/25/2019 2026 Full Code [id number]  Diamantina Monks, MD ED   02/14/2018 1508 02/18/2018 2038 Full Code [id number]  Myrtie Neither, MD Inpatient   12/14/2017 2227 12/28/2017 1912 Full Code [id number]  Marcelle Smiling, MD ED   05/14/2015 0324 05/16/2015 1545 Full Code [id number]  Alberteen Sam, MD Inpatient   06/03/2011 1305 06/11/2011 0042 Full Code [id number]  Dorothea Ogle, MD ED         IV Access:   Peripheral IV   Procedures and diagnostic studies:   No results found.   Medical Consultants:   None.   Subjective:    [name] no complaints has not had a bowel movement today.  Objective:    Vitals:   12/31/22 1413 12/31/22 2038 01/01/23 0442 01/01/23 0500  BP: 114/68 111/74 105/66   Pulse: (!) 102 98 79   Resp: 18 19    Temp: 98.2 F (36.8 C) 98.1 F (36.7 C) 97.8 F (36.6 C)   TempSrc: Oral Oral Oral   SpO2: 98% 94% 99%   Weight: 75.5 kg   74 kg  Height: 5\' 5"  (1.651 m)      SpO2: 99 %   Intake/Output Summary (Last 24 hours) at 01/01/2023 1034 Last data filed at 01/01/2023 0800 Gross per 24 hour  Intake 901.67 ml  Output  475 ml  Net 426.67 ml   Filed Weights   12/31/22 1413 01/01/23 0500  Weight: 75.5 kg 74 kg    Exam: General exam: In no acute distress. Respiratory system: Good air movement and clear to auscultation. Cardiovascular system: S1 & S2 heard, RRR. No JVD. Gastrointestinal system: Abdomen is nondistended, soft and nontender.  Extremities: No pedal edema. Skin: No rashes, lesions or ulcers  Data Reviewed:    Labs: Basic Metabolic Panel: Recent Labs  Lab 12/28/22 1026 12/28/22 2253 12/29/22 1522 12/30/22 0123 12/31/22 0310 01/01/23 0543  NA 138 142 140 140 138 142  K 3.9 4.1 3.6 3.6 3.3* 3.8  CL 108 108 110 108 107 110  CO2 23 26 19* 15* 24 24  GLUCOSE 110* 110* 91 67* 137* 95  BUN 9 8 10 10  <5* <5*  CREATININE 0.88 0.94 0.90 0.95 0.71 0.63  CALCIUM 7.9* 8.5* 7.8* 7.9* 7.3* 7.9*  MG 1.7  --   --   --   --   --    GFR Estimated Creatinine Clearance: 112 mL/min (by C-G formula based on SCr of 0.63 mg/dL). Liver Function Tests: Recent Labs  Lab 12/27/22 0911 12/28/22 1026 12/28/22 2253 12/29/22 1522 12/30/22 0123  AST 21 18 19 18 17   ALT 15 10 12 12 12   ALKPHOS 62 51 64 52 53  BILITOT 1.3* 1.0 1.2* 1.6* 1.3*  PROT 7.1 5.8* 6.9 5.6* 5.7*  ALBUMIN 3.9 3.2* 3.7 3.1* 3.2*   Recent Labs  Lab 12/27/22 0911 12/28/22 2253  LIPASE 26 27   No results for input(s): "AMMONIA" in the last 168 hours. Coagulation profile Recent Labs  Lab 12/28/22 0544 12/28/22 2253 12/31/22 0931 12/31/22 1258 01/01/23 0543  INR 1.9* 1.8* 4.1* 3.6* 3.1*   COVID-19 Labs  No results for input(s): "DDIMER", "FERRITIN", "LDH", "CRP" in the last 72 hours.  Lab Results  Component Value Date   SARSCOV2NAA NEGATIVE 12/27/2022   SARSCOV2NAA NEGATIVE 02/16/2019    CBC: Recent Labs  Lab 12/27/22 0911 12/27/22 1041 12/28/22 2253 12/29/22 1522 12/30/22 0123 12/31/22 0310 01/01/23 0543  WBC 9.7   < > 7.4 7.9 7.9 5.4 4.3  NEUTROABS 7.9*  --   --   --   --   --   --   HGB 15.3   <  > 13.9 12.8* 12.8* 11.5* 12.0*  HCT 47.3   < > 42.7 40.0 41.3 35.3* 38.0*  MCV 94.8   < > 94.1 97.8 98.1 93.6 96.0  PLT 212   < > 196 162 185 160 172   < > = values in this interval not displayed.   Cardiac Enzymes: No results for input(s): "CKTOTAL", "CKMB", "CKMBINDEX", "TROPONINI" in the last 168 hours. BNP (last 3 results) No results for input(s): "PROBNP" in the last 8760 hours. CBG: Recent Labs  Lab 12/30/22 2004 12/31/22 0743 12/31/22 2042 01/01/23 0054 01/01/23 0803  GLUCAP 112* 125* 110* 114* 89  D-Dimer: No results for input(s): "DDIMER" in the last 72 hours. Hgb A1c: No results for input(s): "HGBA1C" in the last 72 hours. Lipid Profile: No results for input(s): "CHOL", "HDL", "LDLCALC", "TRIG", "CHOLHDL", "LDLDIRECT" in the last 72 hours. Thyroid function studies: No results for input(s): "TSH", "T4TOTAL", "T3FREE", "THYROIDAB" in the last 72 hours.  Invalid input(s): "FREET3" Anemia work up: No results for input(s): "VITAMINB12", "FOLATE", "FERRITIN", "TIBC", "IRON", "RETICCTPCT" in the last 72 hours. Sepsis Labs: Recent Labs  Lab 12/29/22 0149 12/29/22 0841 12/29/22 1522 12/30/22 0123 12/30/22 0902 12/31/22 0310 01/01/23 0543  WBC  --   --  7.9 7.9  --  5.4 4.3  LATICACIDVEN 1.0 1.2 1.1  --  1.5  --   --    Microbiology Recent Results (from the past 240 hour(s))  Resp panel by RT-PCR (RSV, Flu A&B, Covid) Anterior Nasal Swab     Status: None   Collection Time: 12/27/22  9:39 AM   Specimen: Anterior Nasal Swab  Result Value Ref Range Status   SARS Coronavirus 2 by RT PCR NEGATIVE NEGATIVE Final    Comment: (NOTE) SARS-CoV-2 target nucleic acids are NOT DETECTED.  The SARS-CoV-2 RNA is generally detectable in upper respiratory specimens during the acute phase of infection. The lowest concentration of SARS-CoV-2 viral copies this assay can detect is 138 copies/mL. A negative result does not preclude SARS-Cov-2 infection and should not be used as  the sole basis for treatment or other patient management decisions. A negative result may occur with  improper specimen collection/handling, submission of specimen other than nasopharyngeal swab, presence of viral mutation(s) within the areas targeted by this assay, and inadequate number of viral copies(<138 copies/mL). A negative result must be combined with clinical observations, patient history, and epidemiological information. The expected result is Negative.  Fact Sheet for Patients:  BloggerCourse.com  Fact Sheet for Healthcare Providers:  SeriousBroker.it  This test is no t yet approved or cleared by the [place] FDA and  has been authorized for detection and/or diagnosis of SARS-CoV-2 by FDA under an Emergency Use Authorization (EUA). This EUA will remain  in effect (meaning this test can be used) for the duration of the COVID-19 declaration under Section 564(b)(1) of the Act, 21 U.S.C.section 360bbb-3(b)(1), unless the authorization is terminated  or revoked sooner.       Influenza A by PCR NEGATIVE NEGATIVE Final   Influenza B by PCR NEGATIVE NEGATIVE Final    Comment: (NOTE) The Xpert Xpress SARS-CoV-2/FLU/RSV plus assay is intended as an aid in the diagnosis of influenza from Nasopharyngeal swab specimens and should not be used as a sole basis for treatment. Nasal washings and aspirates are unacceptable for Xpert Xpress SARS-CoV-2/FLU/RSV testing.  Fact Sheet for Patients: BloggerCourse.com  Fact Sheet for Healthcare Providers: SeriousBroker.it  This test is not yet approved or cleared by the [place] FDA and has been authorized for detection and/or diagnosis of SARS-CoV-2 by FDA under an Emergency Use Authorization (EUA). This EUA will remain in effect (meaning this test can be used) for the duration of the COVID-19 declaration under Section 564(b)(1) of  the Act, 21 U.S.C. section 360bbb-3(b)(1), unless the authorization is terminated or revoked.     Resp Syncytial Virus by PCR NEGATIVE NEGATIVE Final    Comment: (NOTE) Fact Sheet for Patients: BloggerCourse.com  Fact Sheet for Healthcare Providers: SeriousBroker.it  This test is not yet approved or cleared by the [place] FDA and has been authorized for detection and/or diagnosis of SARS-CoV-2  by FDA under an Emergency Use Authorization (EUA). This EUA will remain in effect (meaning this test can be used) for the duration of the COVID-19 declaration under Section 564(b)(1) of the Act, 21 U.S.C. section 360bbb-3(b)(1), unless the authorization is terminated or revoked.  Performed at [facility], [address], [place], [place] [address]   Blood Culture (routine x 2)     Status: None   Collection Time: 12/27/22 10:35 AM   Specimen: BLOOD  Result Value Ref Range Status   Specimen Description   Final    BLOOD SITE NOT SPECIFIED Performed at [facility], [address] [place], [place] [address]    Special Requests   Final    BOTTLES DRAWN AEROBIC AND ANAEROBIC Blood Culture results may not be optimal due to an inadequate volume of blood received in culture bottles Performed at [facility], [address], [place], [place] [address]    Culture   Final    NO GROWTH 5 DAYS Performed at [facility], [address] [place], [place] [address]    Report Status 01/01/2023 FINAL  Final  Blood Culture (routine x 2)     Status: None   Collection Time: 12/27/22  7:15 PM   Specimen: BLOOD  Result Value Ref Range Status   Specimen Description   Final    BLOOD BLOOD LEFT HAND Performed at [facility], [address], [place], [place] [address]    Special Requests   Final    BOTTLES DRAWN AEROBIC AND ANAEROBIC Blood Culture adequate  volume Performed at [facility], [address], [place], [place] [address]    Culture   Final    NO GROWTH 5 DAYS Performed at [facility], [address], [place], [place] [address]    Report Status 01/01/2023 FINAL  Final  Culture, blood (routine x 2)     Status: None (Preliminary result)   Collection Time: 12/29/22  4:40 AM   Specimen: BLOOD RIGHT HAND  Result Value Ref Range Status   Specimen Description   Final    BLOOD RIGHT HAND Performed at [facility], [address], [place], [place] [address]    Special Requests   Final    Blood Culture results may not be optimal due to an inadequate volume of blood received in culture bottles BOTTLES DRAWN AEROBIC AND ANAEROBIC Performed at [facility], [address] [place], [place] [address]    Culture   Final    NO GROWTH 3 DAYS Performed at [facility], [address], [place], [place] [address]    Report Status PENDING  Incomplete  Culture, blood (routine x 2)     Status: None (Preliminary result)   Collection Time: 12/29/22  3:22 PM   Specimen: BLOOD  Result Value Ref Range Status   Specimen Description   Final    BLOOD BLOOD RIGHT HAND Performed at [facility], [address], [place], [place] [address]    Special Requests   Final    BOTTLES DRAWN AEROBIC AND ANAEROBIC Blood Culture results may not be optimal due to an inadequate volume of blood received in culture bottles Performed at [facility], [address], [place], [place] [address]    Culture   Final    NO GROWTH 3 DAYS Performed at [facility], [address], [place], [place] [address]  Report Status PENDING  Incomplete  MRSA Next Gen by PCR, Nasal     Status: None   Collection Time: 12/29/22  3:47 PM   Specimen: Nasal Mucosa; Nasal Swab  Result Value Ref Range Status   MRSA by PCR Next Gen NOT DETECTED NOT DETECTED Final    Comment: (NOTE) The  GeneXpert MRSA Assay (FDA approved for NASAL specimens only), is one component of a comprehensive MRSA colonization surveillance program. It is not intended to diagnose MRSA infection nor to guide or monitor treatment for MRSA infections. Test performance is not FDA approved in patients less than 8 years old. Performed at [facility], [address], [place], [place] [address]      Medications:    baclofen  20 mg Oral QID   Chlorhexidine Gluconate Cloth  6 each Topical Daily   cyclobenzaprine  10 mg Oral BID   cyclobenzaprine  20 mg Oral Q24H   diazepam  2 mg Oral BID   Gerhardt's butt cream   Topical TID   metoprolol tartrate  25 mg Oral BID   mouth rinse  15 mL Mouth Rinse 4 times per day   [START ON 01/02/2023] pantoprazole  40 mg Oral Daily   senna-docusate  3 tablet Oral QHS   sodium chloride flush  3 mL Intravenous Q12H   tamsulosin  0.4 mg Oral Daily   tiZANidine  2 mg Oral BID   tiZANidine  4 mg Oral Q1200   warfarin  3 mg Oral q1600   Warfarin - Pharmacist Dosing Inpatient   Does not apply q1600   Continuous Infusions:  sodium chloride        LOS: 3 days   Marinda Elk  [facility]  01/01/2023, 10:34 AM

## 2023-01-01 NOTE — Plan of Care (Signed)
  Problem: Clinical Measurements: Goal: Will remain free from infection Outcome: Progressing   Problem: Activity: Goal: Risk for activity intolerance will decrease Outcome: Progressing   Problem: Elimination: Goal: Will not experience complications related to urinary retention Outcome: Progressing   Problem: Pain Management: Goal: General experience of comfort will improve Outcome: Progressing   Problem: Safety: Goal: Ability to remain free from injury will improve Outcome: Progressing

## 2023-01-02 DIAGNOSIS — K5981 Ogilvie syndrome: Secondary | ICD-10-CM | POA: Diagnosis not present

## 2023-01-02 DIAGNOSIS — K56600 Partial intestinal obstruction, unspecified as to cause: Secondary | ICD-10-CM | POA: Diagnosis not present

## 2023-01-02 DIAGNOSIS — G809 Cerebral palsy, unspecified: Secondary | ICD-10-CM | POA: Diagnosis not present

## 2023-01-02 LAB — BASIC METABOLIC PANEL
Anion gap: 5 (ref 5–15)
BUN: <5 mg/dL — ABNORMAL LOW (ref 6–20)
CO2: 30 mmol/L (ref 22–32)
Calcium: 8.3 mg/dL — ABNORMAL LOW (ref 8.9–10.3)
Chloride: 109 mmol/L (ref 98–111)
Creatinine, Ser: 0.72 mg/dL (ref 0.61–1.24)
GFR, Estimated: >60 mL/min (ref >60)
Glucose, Bld: 110 mg/dL — ABNORMAL HIGH (ref 70–99)
Potassium: 3.8 mmol/L (ref 3.5–5.1)
Sodium: 144 mmol/L (ref 135–145)

## 2023-01-02 LAB — CBC
HCT: 38.7 % — ABNORMAL LOW (ref 39.0–52.0)
Hemoglobin: 12.3 g/dL — ABNORMAL LOW (ref 13.0–17.0)
MCH: 30.2 pg (ref 26.0–34.0)
MCHC: 31.8 g/dL (ref 30.0–36.0)
MCV: 95.1 fL (ref 80.0–100.0)
Platelets: 192 10*3/uL (ref 150–400)
RBC: 4.07 MIL/uL — ABNORMAL LOW (ref 4.22–5.81)
RDW: 14.6 % (ref 11.5–15.5)
WBC: 3.9 10*3/uL — ABNORMAL LOW (ref 4.0–10.5)
nRBC: 0 % (ref 0.0–0.2)

## 2023-01-02 LAB — GLUCOSE, CAPILLARY
Glucose-Capillary: 127 mg/dL — ABNORMAL HIGH (ref 70–99)
Glucose-Capillary: 91 mg/dL (ref 70–99)

## 2023-01-02 LAB — PROTIME-INR
INR: 2 — ABNORMAL HIGH (ref 0.8–1.2)
Prothrombin Time: 22.8 s — ABNORMAL HIGH (ref 11.4–15.2)

## 2023-01-02 MED ORDER — POLYETHYLENE GLYCOL 3350 17 GM/SCOOP PO POWD: 17.0000 g | Freq: Every day | ORAL | 0 refills | Status: AC

## 2023-01-02 MED ORDER — BISACODYL 5 MG PO TBEC: 5.0000 mg | DELAYED_RELEASE_TABLET | Freq: Every day | ORAL | 1 refills | Status: AC | PRN

## 2023-01-02 MED ORDER — TAMSULOSIN HCL 0.4 MG PO CAPS: 0.4000 mg | ORAL_CAPSULE | Freq: Every day | ORAL | 0 refills | Status: AC

## 2023-01-02 MED ORDER — WARFARIN SODIUM 3 MG PO TABS
3.0000 mg | ORAL_TABLET | Freq: Once | ORAL | Status: DC
  Filled 2023-01-02: qty 1

## 2023-01-02 NOTE — Care Management Important Message (Signed)
Important Message  Patient Details IM Letter given. Name: Quendarius Burrous MRN: [id number] Date of Birth: 1980/11/15   Important Message Given:  Yes - Medicare IM     Caren Macadam 01/02/2023, 10:02 AM

## 2023-01-02 NOTE — TOC Transition Note (Signed)
Transition of Care [facility]) - CM/SW Discharge Note   Patient Details  Name: [name] MRN: [id number] Date of Birth: [birth year]  [facility] CM/SW Contact:  Lanier Clam, RN Phone Number: 01/02/2023, 10:16 AM   Clinical Narrative:   d/c home no needs or orders.    Final next level of care: Home/Self Care Barriers to Discharge: No Barriers Identified   Patient Goals and CMS Choice CMS Medicare.gov Compare Post Acute Care list provided to:: Patient Choice offered to / list presented to : Patient  Discharge Placement                         Discharge Plan and Services Additional resources added to the After Visit Summary for       Post Acute Care Choice: Resumption of Svcs/PTA Provider                               Social Determinants of Health (SDOH) Interventions SDOH Screenings   Food Insecurity: No Food Insecurity (12/29/2022)  Housing: Low Risk  (12/29/2022)  Transportation Needs: No Transportation Needs (12/29/2022)  Utilities: Not At Risk (12/30/2022)  Social Connections: Unknown (02/14/2018)  Tobacco Use: Low Risk  (12/29/2022)     Readmission Risk Interventions    12/28/2022    4:07 PM  Readmission Risk Prevention Plan  Transportation Screening Complete  PCP or Specialist Appt within 5-7 Days Complete  Home Care Screening Complete  Medication Review (RN CM) Complete

## 2023-01-02 NOTE — Discharge Summary (Signed)
Physician Discharge Summary  [name] NGE:[id number] DOB: 05-04-1980 DOA: 12/28/2022  PCP: Fleet Contras, MD  Admit date: 12/28/2022 Discharge date: 01/02/2023  Admitted From: Home Disposition:  Home  Recommendations for Outpatient Follow-up:  Follow up with PCP in 1-2 weeks Please obtain BMP/CBC in one week   Home Health:Yes Equipment/Devices:None  Discharge Condition:Stable CODE STATUS:Full Diet recommendation: Heart Healthy   Brief/Interim Summary:  42 y.o. male past medical history of show cerebral palsy, spasticity, chronic constipation, history of SBO status post pleura Torrey laparotomy in 2019, DVT and paroxysmal atrial fibrillation on Coumadin recently discharged from the hospital 12/28/2022 for an ileus returns with recurrent abdominal pain, CT scan of the abdomen and pelvis resolution of bowel dilation there was a concern for interval development of portal venous mesenteric gas, no PE, general surgery was consulted NG tube was placed and signed off as no surgical intervention needed.  GI was consulted on 12/30/2018 4 GI was concerned about Ogilvie syndrome they recommended to discontinue NG tube and start a bowel regiment MiraLAX twice a day.   Discharge Diagnoses:  Principal Problem:   Ogilvie syndrome Active Problems:   Infantile cerebral palsy (HCC)   Atrial fibrillation (HCC)   Hypertension   History of DVT (deep vein thrombosis)   Abdominal pain   Pain in the abdomen   Palliative care encounter   Goals of care, counseling/discussion   Counseling and coordination of care   Need for emotional support  Adynamic ileus/Ogilvie syndrome/chronic constipation: Surgery was consulted recommended no surgical intervention. GI was consulted recommended bowel regimen and enemas frequently. He initially required NG tube which eventually discontinued, also antibiotics were discontinued. He was started on MiraLAX and docusate and he had regular bowel  movements.  History of DVT/paroxysmal atrial fibrillation: Continue Coumadin with a goal INR 2-3.  Urinary retention: Continue Flomax.  Metabolic acidosis: Now resolved.  Hyperglycemia: Resolved.  Hypokalemia: Repleted now resolved.  Sinus tachycardia/hypertension: No change made to his medication continue Lopressor.  Cerebral palsy: Continue supportive care.   Discharge Instructions  Discharge Instructions     Diet - low sodium heart healthy   Complete by: As directed    Increase activity slowly   Complete by: As directed    No wound care   Complete by: As directed       Allergies as of 01/02/2023       Reactions   Betadine [povidone Iodine] Other (See Comments)   Unknown reaction per caregiver   Ciprofloxacin Other (See Comments)   Unknown reaction per caregiver   Fentanyl Other (See Comments)   Previous reaction resulting in intubation, unclear details per family   Morphine Other (See Comments)   Unknown reaction per caregiver        Medication List     STOP taking these medications    cyclobenzaprine 10 MG tablet Commonly known as: FLEXERIL       TAKE these medications    acetaminophen 325 MG tablet Commonly known as: TYLENOL Take 650 mg by mouth every 6 (six) hours as needed for mild pain or moderate pain. For pain   baclofen 20 MG tablet Commonly known as: LIORESAL Take 1 tablet (20 mg total) by mouth 4 (four) times daily.   bisacodyl 5 MG EC tablet Commonly known as: Dulcolax Take 1 tablet (5 mg total) by mouth daily as needed for moderate constipation.   calcium carbonate 500 MG chewable tablet Commonly known as: TUMS - dosed in mg elemental calcium Chew 1 tablet by  mouth 3 (three) times daily.   cetirizine 10 MG tablet Commonly known as: ZYRTEC Take 10 mg by mouth daily.   cholecalciferol 1000 units tablet Commonly known as: VITAMIN D Take 1,000 Units by mouth 2 (two) times daily.   dextromethorphan 30 MG/5ML  liquid Commonly known as: DELSYM Take 30 mg by mouth 2 (two) times daily as needed for cough.   diazepam 2 MG tablet Commonly known as: VALIUM Take 1 tablet (2 mg total) by mouth 2 (two) times daily. Must last 30 days.   diphenhydrAMINE 25 MG tablet Commonly known as: BENADRYL Take 25 mg by mouth every 6 (six) hours as needed for allergies.   furosemide 20 MG tablet Commonly known as: LASIX Take 20 mg by mouth daily as needed for fluid (leg edema).   Gel-Kam 0.4 % Gel Generic drug: Stannous Fluoride Place 1 application onto teeth daily.   glycerin adult 2 g suppository Place 1 suppository rectally as needed for constipation.   hydrochlorothiazide 25 MG tablet Commonly known as: HYDRODIURIL Take 12.5 mg by mouth daily.   metoprolol succinate 50 MG 24 hr tablet Commonly known as: TOPROL-XL Take 1 tablet (50 mg total) by mouth daily.   multivitamin with minerals Tabs tablet Take 1 tablet by mouth daily.   nitrofurantoin 50 MG capsule Commonly known as: MACRODANTIN Take 50 mg by mouth daily. continuous   nystatin powder Commonly known as: MYCOSTATIN/NYSTOP Apply 1 Application topically at bedtime as needed (irritation).   ondansetron 4 MG disintegrating tablet Commonly known as: Zofran ODT Take 1 tablet (4 mg total) by mouth every 8 (eight) hours as needed for nausea or vomiting.   polyethylene glycol powder 17 GM/SCOOP powder Commonly known as: GLYCOLAX/MIRALAX Take 17 g by mouth daily. What changed:  when to take this reasons to take this   psyllium 95 % Pack Commonly known as: HYDROCIL/METAMUCIL Give him one dose daily as directed on the package.  This is to help with his constipation.  You can buy this over the counter at any drug store. What changed:  how much to take how to take this when to take this reasons to take this additional instructions   senna-docusate 8.6-50 MG tablet Commonly known as: Senokot-S Take 3 tablets by mouth at bedtime.    Sodium Fluoride 5000 PPM 1.1 % Pste Generic drug: Sodium Fluoride Take 1 Application by mouth at bedtime.   tamsulosin 0.4 MG Caps capsule Commonly known as: FLOMAX Take 1 capsule (0.4 mg total) by mouth daily.   tiZANidine 2 MG tablet Commonly known as: ZANAFLEX TAKE 1 TABLET BY MOUTH TWICE A DAY;TAKE 2 TABLETS (4MG ) BY MOUTH AT 12PM (DAY PROGRAM) What changed:  how much to take how to take this when to take this   traMADol 50 MG tablet Commonly known as: ULTRAM Take 100 mg by mouth every 6 (six) hours as needed for moderate pain.   warfarin 3 MG tablet Commonly known as: COUMADIN Take 3 mg by mouth daily at 6 PM.        Allergies  Allergen Reactions   Betadine [Povidone Iodine] Other (See Comments)    Unknown reaction per caregiver   Ciprofloxacin Other (See Comments)    Unknown reaction per caregiver   Fentanyl Other (See Comments)    Previous reaction resulting in intubation, unclear details per family   Morphine Other (See Comments)    Unknown reaction per caregiver    Consultations: Gastroenterology General surgery   Procedures/Studies: [facility] Chest Port 1 [address]  Result Date: 12/29/2022 CLINICAL DATA:  NG tube placement EXAM: PORTABLE CHEST 1 VIEW COMPARISON:  None Available. FINDINGS: Limited x-ray of the lower chest and upper abdomen demonstrates NG tube with the tip overlying the left upper quadrant, likely in the fundus of the stomach. There are some distended air-filled loops of bowel elsewhere in the upper abdomen. IMPRESSION: Enteric tube in place with tip overlying the fundus of the stomach in the left upper quadrant on this limited portable x-ray Electronically Signed   By: Karen Kays M.D.   On: 12/29/2022 13:36   DG Abdomen 1 View  Result Date: 12/29/2022 CLINICAL DATA:  NG tube placement. EXAM: ABDOMEN - 1 VIEW COMPARISON:  One-view abdomen 12/28/2022 FINDINGS: The side port of the NG tube is just past the GE junction. Bowel gas pattern is normal.  IMPRESSION: Side port of the NG tube is just past the GE junction. Electronically Signed   By: Marin Roberts M.D.   On: 12/29/2022 09:10   CT Angio Chest/Abd/Pel for Dissection W and/or W/WO  Result Date: 12/29/2022 CLINICAL DATA:  Abdominal pain with abnormal CT abdomen and pelvis yesterday with portal and mesenteric venous gas. EXAM: CT ANGIOGRAPHY CHEST, ABDOMEN AND PELVIS TECHNIQUE: Noncontrast CT of the chest was initially obtained. Multidetector CT imaging through the chest, abdomen and pelvis was performed using the standard protocol during bolus administration of intravenous contrast. Multiplanar reconstructed images and MIPs were obtained and reviewed to evaluate the vascular anatomy. RADIATION DOSE REDUCTION: This exam was performed according to the departmental dose-optimization program which includes automated exposure control, adjustment of the mA and/or kV according to patient size and/or use of iterative reconstruction technique. CONTRAST:  OMNIPAQUE IOHEXOL 350 MG/ML SOLN COMPARISON:  Portable chest 12/27/2022, CTA chest 03/09/2010, CT abdomen and pelvis yesterday at 11:26 p.m. with contrast, CT abdomen pelvis 12/27/2022 with contrast, and CT abdomen and pelvis with IV contrast 05/14/2021. FINDINGS: CTA CHEST FINDINGS Cardiovascular: No arterial embolus is seen through the segmental level. The pulmonary arteries are normal in caliber. There is mild cardiomegaly with a left chamber predominance. No pulmonary venous dilatation is seen. No pericardial effusion. The aorta and great vessels are normal caliber. There is trace calcific plaque of the distal aortic arch. There is no aneurysm, dissection or stenosis. Mediastinum/Nodes: No enlarged mediastinal, hilar, or axillary lymph nodes. Thyroid gland, trachea, and esophagus demonstrate no significant findings. Lungs/Pleura: Trace right pleural effusion. No left pleural fluid. Mild posterior atelectasis both lungs. No consolidation or  nodules. No pneumothorax. Diffuse bronchial thickening. Musculoskeletal: Mild-to-moderate thoracic dextroscoliosis. 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 No acute or other significant chest osseous findings. Subareolar gynecomastia. Review of the MIP images confirms the above findings. CTA ABDOMEN AND PELVIS FINDINGS VASCULAR Aorta: Normal. 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 Slight poststenotic ectasia with otherwise normal vessel opacification. No branch vessel occlusion. SMA: Patent without evidence of aneurysm, dissection, vasculitis or significant stenosis. No branch occlusion is seen. Renals: Both are single. Both are unremarkable. No branch occlusion is seen. IMA: Normal. Inflow: Patent without evidence of aneurysm, dissection, vasculitis or significant stenosis. Once again the left common iliac and external iliac arteries are asymmetrically larger. 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  Again noted is mesenteric venous gas in the left abdomen posterior to the stomach. Review of the MIP images confirms the above findings. NON-VASCULAR Hepatobiliary: There are small scattered hepatic cysts, and again noted portal venous gas. There are stones within a mildly dilated but otherwise unremarkable gallbladder without wall thickening. No biliary dilatation. Pancreas: No abnormality. Spleen: No abnormality. Adrenals/Urinary Tract: Small renal cysts. No follow-up imaging recommended. No adrenal mass. No evidence of obstructive uropathy or stones. Unremarkable bladder. Stomach/Bowel: Stomach is distended with air and fluid. Query wall pneumatosis posteriorly, seen on the recent CT but not the  earlier studies. No small bowel dilatation is seen. There is fluid in the colon and colonic dilatation up to 8 cm. There is no visible bowel pneumatosis. 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 This is not associated with wall thickening or inflammatory change. An appendix is not seen. Lymphatic: No adenopathy is evident. Reproductive: Enlarged prostate, 5.6 cm. Other: No free air or fluid.  No incarcerated hernia. Musculoskeletal: No acute or significant osseous findings. Review of the MIP images confirms the above findings. IMPRESSION: 1. No evidence of pulmonary arterial embolus or aortic dissection. 2. Trace right pleural effusion. 3. Diffuse bronchial thickening.  No focal infiltrate. 4. Mild cardiomegaly. 5. 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 No SMA stenosis or branch vessel occlusion. 6. 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 7. 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 8. Mesenteric venous gas in the left abdomen posterior to the stomach. Query gastric wall pneumatosis posteriorly, seen on the recent CT but not the earlier studies. Wall ischemia not excluded. 9. Fluid in the colon and colonic dilatation up to 8 cm. No visible bowel pneumatosis. 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 10. Cholelithiasis. 11. Enlarged prostate. 12. Subareolar gynecomastia. 13. Aortic atherosclerosis. Aortic Atherosclerosis (ICD10-I70.0). Electronically Signed   By: Almira Bar M.D.   On: 12/29/2022 02:24   CT ABDOMEN PELVIS W CONTRAST  Result Date: 12/29/2022 CLINICAL DATA:  Abdominal pain, acute, nonlocalized hx SBO,  worsening pain Pt. BIB [facility] for abdominal pain x1 day. States that the last few hours his pain has been unbearable. Hx of cerebral palsy and SBO. Per EMS pt. Belched and stated that he felt better. EXAM: CT ABDOMEN AND PELVIS WITH CONTRAST TECHNIQUE: Multidetector CT imaging of the abdomen and pelvis was performed using the standard protocol following bolus administration of intravenous contrast. RADIATION DOSE REDUCTION: This exam was performed according to the departmental dose-optimization program which includes automated exposure control, adjustment of the mA and/or kV according to patient size and/or use of iterative reconstruction technique. CONTRAST:  OMNIPAQUE IOHEXOL 300 MG/ML  SOLN COMPARISON:  CT abdomen pelvis 12/27/2022 FINDINGS: Lower chest: No acute abnormality. Hepatobiliary: Subcentimeter hypodensities too small. Otherwise no focal liver abnormality. Calcified gallstone noted within the gallbladder lumen. No gallbladder wall thickening or pericholecystic fluid. No biliary dilatation. Pancreas: No focal lesion. Normal pancreatic contour. No surrounding inflammatory changes. No main pancreatic ductal dilatation. Spleen: Normal in size without focal abnormality. Adrenals/Urinary Tract: No adrenal nodule bilaterally. Bilateral kidneys enhance symmetrically. Subcentimeter hypodensities are too small to characterize-no further follow-up indicated. No  hydronephrosis. No hydroureter. The urinary bladder is unremarkable. Stomach/Bowel: Pneumatosis of the gastric fundus is not fully excluded (11:13). Interval resolution of small bowel dilatation. No evidence of bowel wall thickening. Fluid within the lumen of the colon. Stool noted within the rectum. Appendix appears normal. Vascular/Lymphatic: Interval development of portal venous gas and mesenteric venous gas. 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 Similar-appearing left  inguinal femoral artery pseudoaneurysm versus arteriovenous fistula measuring 3.9 x 3.2 cm. No abdominal aorta or iliac aneurysm. No abdominal, pelvic, or inguinal lymphadenopathy. Reproductive: Prostate is enlarged measuring up to 5.8 cm Other: No intraperitoneal free fluid. No intraperitoneal free gas. No organized fluid collection. Musculoskeletal: No abdominal wall hernia or abnormality. Persistent left hip subcutaneus soft tissue edema. No suspicious lytic or blastic osseous lesions. No acute displaced fracture. IMPRESSION: 1. Resolved small and large bowel dilatation with persistent diarrheal state; however, interval development of portal venous and mesenteric venous gas concerning for sequelae of associated ischemia/necrosis. Pneumatosis of the gastric fundus is not fully excluded. Recommend trending of lactate levels. No bowel perforation. 2. Cholelithiasis with no CT evidence of acute cholecystitis. 3. Prostatomegaly. 4. 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 These results were called by telephone at the time of interpretation on 12/29/2022 at 12:24 am to provider [facility] , who verbally acknowledged these results. Electronically Signed   By: Tish Frederickson M.D.   On: 12/29/2022 00:29   DG Abd 1 View  Result Date: 12/28/2022 CLINICAL DATA:  Bowel obstruction EXAM: ABDOMEN - 1 VIEW COMPARISON:  Abdominal CT from yesterday FINDINGS: Diffuse small and especially large bowel gas. The degree of gaseous distention is improved when compared to prior CT scanogram. Cholelithiasis over the right upper quadrant. No concerning mass effect or gas collection. IMPRESSION: Generalized gaseous distension of is improved. Electronically Signed   By: Tiburcio Pea M.D.   On: 12/28/2022 06:44   CT ABDOMEN PELVIS W  CONTRAST  Result Date: 12/27/2022 CLINICAL DATA:  Acute generalized abdominal pain, nausea, vomiting. EXAM: CT ABDOMEN AND PELVIS WITH CONTRAST TECHNIQUE: Multidetector CT imaging of the abdomen and pelvis was performed using the standard protocol following bolus administration of intravenous contrast. RADIATION DOSE REDUCTION: This exam was performed according to the departmental dose-optimization program which includes automated exposure control, adjustment of the mA and/or kV according to patient size and/or use of iterative reconstruction technique. CONTRAST:  OMNIPAQUE IOHEXOL 300 MG/ML  SOLN COMPARISON:  May 14, 2021. FINDINGS: Lower chest: No acute abnormality. Hepatobiliary: Cholelithiasis. No biliary dilatation. Stable hepatic cysts are noted. Pancreas: Unremarkable. No pancreatic ductal dilatation or surrounding inflammatory changes. Spleen: Normal in size without focal abnormality. Adrenals/Urinary Tract: Adrenal glands appear normal. No hydronephrosis or renal obstruction is noted. Bilateral renal cysts are noted for which no further follow-up is required. No hydronephrosis or renal obstruction is noted. Urinary bladder is decompressed. Stomach/Bowel: Moderate gastric distention is noted. Evaluation of bowel loops is limited due to motion artifact. Moderate amount of stool seen in sigmoid colon and rectum. Mild colonic dilatation is noted which may represent ileus or potentially distal colonic obstruction. Mildly dilated small bowel loops are noted which may represent ileus. Vascular/Lymphatic: There appears to be chronic occlusion of the left external iliac vein with resulting collaterals in the subcutaneous tissues of the lower abdomen  and left upper thigh. No adenopathy is noted. Reproductive: Mild prostatic enlargement is noted. Other: No abdominal wall hernia or abnormality. No abdominopelvic ascites. Musculoskeletal: No acute or significant osseous findings. IMPRESSION: Limited exam due  to patient motion artifact. Moderate amount of stool seen in the sigmoid colon and rectum. Mild colonic dilatation is noted which may represent ileus or potentially distal colonic obstruction. Mildly dilated small bowel loops are noted as well as moderate gastric distention which may represent ileus. Mild prostatic enlargement. 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 Cholelithiasis. Electronically Signed   By: Lupita Raider M.D.   On: 12/27/2022 13:12   DG Chest Port 1 View  Result Date: 12/27/2022 CLINICAL DATA:  Abdominal pain, nausea, vomiting and diarrhea. EXAM: PORTABLE CHEST 1 VIEW COMPARISON:  02/16/2019. FINDINGS: Cardiac silhouette is normal in size. No mediastinal or hilar masses. Lung volumes are low. Lungs are clear. No convincing pleural effusion or pneumothorax. Skeletal structures are grossly intact. IMPRESSION: No active disease. Electronically Signed   By: Amie Portland M.D.   On: 12/27/2022 09:48   (Echo, Carotid, EGD, Colonoscopy, ERCP)    Subjective: No complaints  Discharge Exam: Vitals:   01/01/23 2109 01/02/23 0459  BP: 104/69 96/60  Pulse: (!) 105 84  Resp:    Temp: (!) 97.3 F (36.3 C)   SpO2: 100% 98%   Vitals:   01/01/23 0500 01/01/23 1433 01/01/23 2109 01/02/23 0459  BP:  125/68 104/69 96/60  Pulse:  88 (!) 105 84  Resp:  16    Temp:  (!) 97.5 F (36.4 C) (!) 97.3 F (36.3 C)   TempSrc:  Oral Oral   SpO2:  97% 100% 98%  Weight: 74 kg     Height:        General: Pt is alert, awake, not in acute distress Cardiovascular: RRR, S1/S2 +, no rubs, no gallops Respiratory: CTA bilaterally, no wheezing, no rhonchi Abdominal: Soft, NT, ND, bowel sounds + Extremities: no edema, no cyanosis    The results of significant diagnostics from this hospitalization (including imaging, microbiology, ancillary and laboratory) are listed below  for reference.     Microbiology: Recent Results (from the past 240 hour(s))  Resp panel by RT-PCR (RSV, Flu A&B, Covid) Anterior Nasal Swab     Status: None   Collection Time: 12/27/22  9:39 AM   Specimen: Anterior Nasal Swab  Result Value Ref Range Status   SARS Coronavirus 2 by RT PCR NEGATIVE NEGATIVE Final    Comment: (NOTE) SARS-CoV-2 target nucleic acids are NOT DETECTED.  The SARS-CoV-2 RNA is generally detectable in upper respiratory specimens during the acute phase of infection. The lowest concentration of SARS-CoV-2 viral copies this assay can detect is 138 copies/mL. A negative result does not preclude SARS-Cov-2 infection and should not be used as the sole basis for treatment or other patient management decisions. A negative result may occur with  improper specimen collection/handling, submission of specimen other than nasopharyngeal swab, presence of viral mutation(s) within the areas targeted by this assay, and inadequate number of viral copies(<138 copies/mL). A negative result must be combined with clinical observations, patient history, and epidemiological information. The expected result is Negative.  Fact Sheet for Patients:  BloggerCourse.com  Fact Sheet for Healthcare Providers:  SeriousBroker.it  This test is no t yet approved or cleared by the [place] FDA and  has been  authorized for detection and/or diagnosis of SARS-CoV-2 by FDA under an Emergency Use Authorization (EUA). This EUA will remain  in effect (meaning this test can be used) for the duration of the COVID-19 declaration under Section 564(b)(1) of the Act, 21 U.S.C.section 360bbb-3(b)(1), unless the authorization is terminated  or revoked sooner.       Influenza A by PCR NEGATIVE NEGATIVE Final   Influenza B by PCR NEGATIVE NEGATIVE Final    Comment: (NOTE) The Xpert Xpress SARS-CoV-2/FLU/RSV plus assay is intended as an aid in the  diagnosis of influenza from Nasopharyngeal swab specimens and should not be used as a sole basis for treatment. Nasal washings and aspirates are unacceptable for Xpert Xpress SARS-CoV-2/FLU/RSV testing.  Fact Sheet for Patients: BloggerCourse.com  Fact Sheet for Healthcare Providers: SeriousBroker.it  This test is not yet approved or cleared by the [place] FDA and has been authorized for detection and/or diagnosis of SARS-CoV-2 by FDA under an Emergency Use Authorization (EUA). This EUA will remain in effect (meaning this test can be used) for the duration of the COVID-19 declaration under Section 564(b)(1) of the Act, 21 U.S.C. section 360bbb-3(b)(1), unless the authorization is terminated or revoked.     Resp Syncytial Virus by PCR NEGATIVE NEGATIVE Final    Comment: (NOTE) Fact Sheet for Patients: BloggerCourse.com  Fact Sheet for Healthcare Providers: SeriousBroker.it  This test is not yet approved or cleared by the [place] FDA and has been authorized for detection and/or diagnosis of SARS-CoV-2 by FDA under an Emergency Use Authorization (EUA). This EUA will remain in effect (meaning this test can be used) for the duration of the COVID-19 declaration under Section 564(b)(1) of the Act, 21 U.S.C. section 360bbb-3(b)(1), unless the authorization is terminated or revoked.  Performed at [facility], [address], [place], [place] [address]   Blood Culture (routine x 2)     Status: None   Collection Time: 12/27/22 10:35 AM   Specimen: BLOOD  Result Value Ref Range Status   Specimen Description   Final    BLOOD SITE NOT SPECIFIED Performed at [facility], [address], [place], [place] [address]    Special Requests   Final    BOTTLES DRAWN AEROBIC AND ANAEROBIC Blood Culture results may not be optimal due to an  inadequate volume of blood received in culture bottles Performed at [facility], [address] [place], [place] [address]    Culture   Final    NO GROWTH 5 DAYS Performed at [facility], [address] [place], [place] [address]    Report Status 01/01/2023 FINAL  Final  Blood Culture (routine x 2)     Status: None   Collection Time: 12/27/22  7:15 PM   Specimen: BLOOD  Result Value Ref Range Status   Specimen Description   Final    BLOOD BLOOD LEFT HAND Performed at [facility], [address], [place], [place] [address]    Special Requests   Final    BOTTLES DRAWN AEROBIC AND ANAEROBIC Blood Culture adequate volume Performed at [facility], [address], [place], [place] [address]    Culture   Final    NO GROWTH 5 DAYS Performed at [facility], [address], [place], [place] [address]    Report Status 01/01/2023 FINAL  Final  Culture, blood (routine x 2)     Status: None (Preliminary result)   Collection Time:  12/29/22  4:40 AM   Specimen: BLOOD RIGHT HAND  Result Value Ref Range Status   Specimen Description   Final    BLOOD RIGHT HAND Performed at [facility], [address], [place], [place] [address]    Special Requests   Final    Blood Culture results may not be optimal due to an inadequate volume of blood received in culture bottles BOTTLES DRAWN AEROBIC AND ANAEROBIC Performed at [facility], [address] [place], [place] [address]    Culture   Final    NO GROWTH 4 DAYS Performed at [facility], [address], [place], [place] [address]    Report Status PENDING  Incomplete  Culture, blood (routine x 2)     Status: None (Preliminary result)   Collection Time: 12/29/22  3:22 PM   Specimen: BLOOD  Result Value Ref Range Status   Specimen Description   Final    BLOOD BLOOD RIGHT HAND Performed at [facility], [address], [place], [place] [address]    Special Requests   Final    BOTTLES DRAWN AEROBIC AND ANAEROBIC Blood Culture results may not be optimal due to an inadequate volume of blood received in culture bottles Performed at [facility], [address], [place], [place] [address]    Culture   Final    NO GROWTH 4 DAYS Performed at [facility], [address] [place], [place] [address]    Report Status PENDING  Incomplete  MRSA Next Gen by PCR, Nasal     Status: None   Collection Time: 12/29/22  3:47 PM   Specimen: Nasal Mucosa; Nasal Swab  Result Value Ref Range Status   MRSA by PCR Next Gen NOT DETECTED NOT DETECTED Final    Comment: (NOTE) The GeneXpert MRSA Assay (FDA approved for NASAL specimens only), is one component of a comprehensive MRSA colonization surveillance program. It is not intended to diagnose MRSA infection nor to guide or monitor treatment for MRSA infections. Test performance is not FDA approved in patients less than 37 years old. Performed at [facility], [address] [place], [place] [address]      Labs: BNP (last 3 results) No results for input(s): "BNP" in the last 8760 hours. Basic Metabolic Panel: Recent Labs  Lab 12/28/22 1026 12/28/22 2253 12/29/22 1522 12/30/22 0123 12/31/22 0310 01/01/23 0543 01/02/23 0529  NA 138   < > 140 140 138 142 144  K 3.9   < > 3.6 3.6 3.3* 3.8 3.8  CL 108   < > 110 108 107 110 109  CO2 23   < > 19* 15* 24 24 30   GLUCOSE 110*   < > 91 67* 137* 95 110*  BUN 9   < > 10 10 <5* <5* <5*  CREATININE 0.88   < > 0.90 0.95 0.71 0.63 0.72  CALCIUM 7.9*   < > 7.8* 7.9* 7.3* 7.9* 8.3*  MG 1.7  --   --   --   --   --   --    < > = values in this interval not displayed.   Liver Function Tests: Recent Labs  Lab 12/27/22 0911 12/28/22 1026 12/28/22 2253 12/29/22 1522 12/30/22 0123  AST 21 18 19 18 17   ALT 15 10 12 12 12   ALKPHOS 62 51 64 52 53  BILITOT 1.3* 1.0 1.2* 1.6* 1.3*   PROT 7.1  5.8* 6.9 5.6* 5.7*  ALBUMIN 3.9 3.2* 3.7 3.1* 3.2*   Recent Labs  Lab 12/27/22 0911 12/28/22 2253  LIPASE 26 27   No results for input(s): "AMMONIA" in the last 168 hours. CBC: Recent Labs  Lab 12/27/22 0911 12/27/22 1041 12/29/22 1522 12/30/22 0123 12/31/22 0310 01/01/23 0543 01/02/23 0529  WBC 9.7   < > 7.9 7.9 5.4 4.3 3.9*  NEUTROABS 7.9*  --   --   --   --   --   --   HGB 15.3   < > 12.8* 12.8* 11.5* 12.0* 12.3*  HCT 47.3   < > 40.0 41.3 35.3* 38.0* 38.7*  MCV 94.8   < > 97.8 98.1 93.6 96.0 95.1  PLT 212   < > 162 185 160 172 192   < > = values in this interval not displayed.   Cardiac Enzymes: No results for input(s): "CKTOTAL", "CKMB", "CKMBINDEX", "TROPONINI" in the last 168 hours. BNP: Invalid input(s): "POCBNP" CBG: Recent Labs  Lab 01/01/23 1149 01/01/23 1617 01/01/23 2112 01/02/23 0011 01/02/23 0752  GLUCAP 93 93 108* 127* 91   D-Dimer No results for input(s): "DDIMER" in the last 72 hours. Hgb A1c No results for input(s): "HGBA1C" in the last 72 hours. Lipid Profile No results for input(s): "CHOL", "HDL", "LDLCALC", "TRIG", "CHOLHDL", "LDLDIRECT" in the last 72 hours. Thyroid function studies No results for input(s): "TSH", "T4TOTAL", "T3FREE", "THYROIDAB" in the last 72 hours.  Invalid input(s): "FREET3" Anemia work up No results for input(s): "VITAMINB12", "FOLATE", "FERRITIN", "TIBC", "IRON", "RETICCTPCT" in the last 72 hours. Urinalysis    Component Value Date/Time   COLORURINE YELLOW 12/29/2022 0356   APPEARANCEUR CLEAR 12/29/2022 0356   LABSPEC >1.046 (H) 12/29/2022 0356   PHURINE 5.0 12/29/2022 0356   GLUCOSEU NEGATIVE 12/29/2022 0356   HGBUR NEGATIVE 12/29/2022 0356   BILIRUBINUR NEGATIVE 12/29/2022 0356   BILIRUBINUR negative 05/11/2015 1041   KETONESUR 20 (A) 12/29/2022 0356   PROTEINUR NEGATIVE 12/29/2022 0356   UROBILINOGEN 1.0 05/11/2015 1041   UROBILINOGEN 0.2 06/03/2011 1809   NITRITE NEGATIVE 12/29/2022 0356    LEUKOCYTESUR NEGATIVE 12/29/2022 0356   Sepsis Labs Recent Labs  Lab 12/30/22 0123 12/31/22 0310 01/01/23 0543 01/02/23 0529  WBC 7.9 5.4 4.3 3.9*   Microbiology Recent Results (from the past 240 hour(s))  Resp panel by RT-PCR (RSV, Flu A&B, Covid) Anterior Nasal Swab     Status: None   Collection Time: 12/27/22  9:39 AM   Specimen: Anterior Nasal Swab  Result Value Ref Range Status   SARS Coronavirus 2 by RT PCR NEGATIVE NEGATIVE Final    Comment: (NOTE) SARS-CoV-2 target nucleic acids are NOT DETECTED.  The SARS-CoV-2 RNA is generally detectable in upper respiratory specimens during the acute phase of infection. The lowest concentration of SARS-CoV-2 viral copies this assay can detect is 138 copies/mL. A negative result does not preclude SARS-Cov-2 infection and should not be used as the sole basis for treatment or other patient management decisions. A negative result may occur with  improper specimen collection/handling, submission of specimen other than nasopharyngeal swab, presence of viral mutation(s) within the areas targeted by this assay, and inadequate number of viral copies(<138 copies/mL). A negative result must be combined with clinical observations, patient history, and epidemiological information. The expected result is Negative.  Fact Sheet for Patients:  BloggerCourse.com  Fact Sheet for Healthcare Providers:  SeriousBroker.it  This test is no t yet approved or cleared by the [place] FDA and  has  been authorized for detection and/or diagnosis of SARS-CoV-2 by FDA under an Emergency Use Authorization (EUA). This EUA will remain  in effect (meaning this test can be used) for the duration of the COVID-19 declaration under Section 564(b)(1) of the Act, 21 U.S.C.section 360bbb-3(b)(1), unless the authorization is terminated  or revoked sooner.       Influenza A by PCR NEGATIVE NEGATIVE Final    Influenza B by PCR NEGATIVE NEGATIVE Final    Comment: (NOTE) The Xpert Xpress SARS-CoV-2/FLU/RSV plus assay is intended as an aid in the diagnosis of influenza from Nasopharyngeal swab specimens and should not be used as a sole basis for treatment. Nasal washings and aspirates are unacceptable for Xpert Xpress SARS-CoV-2/FLU/RSV testing.  Fact Sheet for Patients: BloggerCourse.com  Fact Sheet for Healthcare Providers: SeriousBroker.it  This test is not yet approved or cleared by the [place] FDA and has been authorized for detection and/or diagnosis of SARS-CoV-2 by FDA under an Emergency Use Authorization (EUA). This EUA will remain in effect (meaning this test can be used) for the duration of the COVID-19 declaration under Section 564(b)(1) of the Act, 21 U.S.C. section 360bbb-3(b)(1), unless the authorization is terminated or revoked.     Resp Syncytial Virus by PCR NEGATIVE NEGATIVE Final    Comment: (NOTE) Fact Sheet for Patients: BloggerCourse.com  Fact Sheet for Healthcare Providers: SeriousBroker.it  This test is not yet approved or cleared by the [place] FDA and has been authorized for detection and/or diagnosis of SARS-CoV-2 by FDA under an Emergency Use Authorization (EUA). This EUA will remain in effect (meaning this test can be used) for the duration of the COVID-19 declaration under Section 564(b)(1) of the Act, 21 U.S.C. section 360bbb-3(b)(1), unless the authorization is terminated or revoked.  Performed at [facility], [address], [place], [place] [address]   Blood Culture (routine x 2)     Status: None   Collection Time: 12/27/22 10:35 AM   Specimen: BLOOD  Result Value Ref Range Status   Specimen Description   Final    BLOOD SITE NOT SPECIFIED Performed at [facility], [address],  [place], [place] [address]    Special Requests   Final    BOTTLES DRAWN AEROBIC AND ANAEROBIC Blood Culture results may not be optimal due to an inadequate volume of blood received in culture bottles Performed at [facility], [address] [place], [place] [address]    Culture   Final    NO GROWTH 5 DAYS Performed at [facility], [address] [place], [place] [address]    Report Status 01/01/2023 FINAL  Final  Blood Culture (routine x 2)     Status: None   Collection Time: 12/27/22  7:15 PM   Specimen: BLOOD  Result Value Ref Range Status   Specimen Description   Final    BLOOD BLOOD LEFT HAND Performed at [facility], [address] [place], [place] [address]    Special Requests   Final    BOTTLES DRAWN AEROBIC AND ANAEROBIC Blood Culture adequate volume Performed at [facility], [address] [place], [place] [address]    Culture   Final    NO GROWTH 5 DAYS Performed at [facility], [address] [place], [place] [address]    Report Status 01/01/2023 FINAL  Final  Culture, blood (routine x 2)     Status: None (Preliminary result)   Collection  Time: 12/29/22  4:40 AM   Specimen: BLOOD RIGHT HAND  Result Value Ref Range Status   Specimen Description   Final    BLOOD RIGHT HAND Performed at [facility], [address], [place], [place] [address]    Special Requests   Final    Blood Culture results may not be optimal due to an inadequate volume of blood received in culture bottles BOTTLES DRAWN AEROBIC AND ANAEROBIC Performed at [facility], [address] [place], [place] [address]    Culture   Final    NO GROWTH 4 DAYS Performed at [facility], [address], [place], [place] [address]    Report Status PENDING  Incomplete  Culture, blood (routine x 2)     Status: None (Preliminary result)   Collection Time: 12/29/22  3:22 PM   Specimen: BLOOD  Result Value  Ref Range Status   Specimen Description   Final    BLOOD BLOOD RIGHT HAND Performed at [facility], [address] [place], [place] [address]    Special Requests   Final    BOTTLES DRAWN AEROBIC AND ANAEROBIC Blood Culture results may not be optimal due to an inadequate volume of blood received in culture bottles Performed at [facility], [address], [place], [place] [address]    Culture   Final    NO GROWTH 4 DAYS Performed at [facility], [address] [place] [place], [place] [address]    Report Status PENDING  Incomplete  MRSA Next Gen by PCR, Nasal     Status: None   Collection Time: 12/29/22  3:47 PM   Specimen: Nasal Mucosa; Nasal Swab  Result Value Ref Range Status   MRSA by PCR Next Gen NOT DETECTED NOT DETECTED Final    Comment: (NOTE) The GeneXpert MRSA Assay (FDA approved for NASAL specimens only), is one component of a comprehensive MRSA colonization surveillance program. It is not intended to diagnose MRSA infection nor to guide or monitor treatment for MRSA infections. Test performance is not FDA approved in patients less than 23 years old. Performed at [facility] [address], [place], [place] [address]      Time coordinating discharge: Over 35 minutes  SIGNED:   Marinda Elk, MD  [facility] 01/02/2023, 9:26 AM Pager   If 7PM-7AM, please contact night-coverage [email or web address] Password [email or web address]

## 2023-01-02 NOTE — Progress Notes (Signed)
PHARMACY - ANTICOAGULATION CONSULT NOTE  Pharmacy Consult for warfarin Indication: hx recurrent DVT and afib  Allergies  Allergen Reactions   Betadine [Povidone Iodine] Other (See Comments)    Unknown reaction per caregiver   Ciprofloxacin Other (See Comments)    Unknown reaction per caregiver   Fentanyl Other (See Comments)    Previous reaction resulting in intubation, unclear details per family   Morphine Other (See Comments)    Unknown reaction per caregiver    Patient Measurements: Height: 5\' 5"  (165.1 cm) Weight: 74 kg (163 lb 2.3 oz) IBW/kg (Calculated) : 61.5 Heparin Dosing Weight:   Vital Signs: BP: 96/60 (12/05 1033) Pulse Rate: 84 (12/05 0459)  Labs: Recent Labs    12/31/22 0310 12/31/22 0931 12/31/22 1258 01/01/23 0543 01/02/23 0529  HGB 11.5*  --   --  12.0* 12.3*  HCT 35.3*  --   --  38.0* 38.7*  PLT 160  --   --  172 192  LABPROT  --    < > 36.4* 31.8* 22.8*  INR  --    < > 3.6* 3.1* 2.0*  CREATININE 0.71  --   --  0.63 0.72   < > = values in this interval not displayed.    Estimated Creatinine Clearance: 112 mL/min (by C-G formula based on SCr of 0.72 mg/dL).   Medical History: Past Medical History:  Diagnosis Date   Cerebral palsy (HCC)    Chronic constipation    Coagulopathy (HCC)    DVT (deep venous thrombosis) (HCC)    Dysrhythmia    Hypertension    Insomnia    Pneumonia    Recurrent UTI    Tachycardia     Medications:  - on warfarin 3mg  daily PTA   Assessment: Patient is a 42 y.o m with hx SBO, recurrent DVT and afib on warfarin PTA who presented to the ED on 12/28/22 with c/o abdominal pain, distention  and n/v.  He was started on heparin drip on admission (11/30), changed to lovenox on 12/2, and warfarin resumed on 12/4.   Today, 01/02/2023: - INR is therapeutic at 2 - cbc stable - no bleeding documented  - no signif. drug-drug intxns  Goal of Therapy:  INR 2-3 Monitor platelets by anticoagulation protocol: Yes   Plan:   - warfarin 3mg  PO x1 at 4p today if he is not discharged - recommend warfarin 3mg  daily for discharge  Ifeoma Vallin P 01/02/2023,11:15 AM

## 2023-01-03 LAB — CULTURE, BLOOD (ROUTINE X 2)
Culture: NO GROWTH
Culture: NO GROWTH

## 2024-02-27 ENCOUNTER — Emergency Department (HOSPITAL_COMMUNITY)

## 2024-02-27 ENCOUNTER — Emergency Department (HOSPITAL_COMMUNITY)
Admission: EM | Admit: 2024-02-27 | Discharge: 2024-02-28 | Disposition: A | Attending: Emergency Medicine | Admitting: Emergency Medicine

## 2024-02-27 DIAGNOSIS — J189 Pneumonia, unspecified organism: Secondary | ICD-10-CM

## 2024-02-27 DIAGNOSIS — I10 Essential (primary) hypertension: Secondary | ICD-10-CM | POA: Diagnosis not present

## 2024-02-27 DIAGNOSIS — J168 Pneumonia due to other specified infectious organisms: Secondary | ICD-10-CM | POA: Diagnosis not present

## 2024-02-27 DIAGNOSIS — Z7901 Long term (current) use of anticoagulants: Secondary | ICD-10-CM | POA: Insufficient documentation

## 2024-02-27 DIAGNOSIS — N3001 Acute cystitis with hematuria: Secondary | ICD-10-CM | POA: Diagnosis not present

## 2024-02-27 DIAGNOSIS — Z79899 Other long term (current) drug therapy: Secondary | ICD-10-CM | POA: Insufficient documentation

## 2024-02-27 DIAGNOSIS — R051 Acute cough: Secondary | ICD-10-CM

## 2024-02-27 DIAGNOSIS — R319 Hematuria, unspecified: Secondary | ICD-10-CM | POA: Diagnosis present

## 2024-02-27 LAB — URINALYSIS, ROUTINE W REFLEX MICROSCOPIC
Bilirubin Urine: NEGATIVE
Glucose, UA: NEGATIVE mg/dL
Ketones, ur: 5 mg/dL — AB
Nitrite: POSITIVE — AB
Protein, ur: 100 mg/dL — AB
RBC / HPF: >50 RBC/hpf (ref 0–5)
Specific Gravity, Urine: 1.02 (ref 1.005–1.030)
WBC, UA: >50 WBC/hpf (ref 0–5)
pH: 8 (ref 5.0–8.0)

## 2024-02-27 MED ORDER — CEPHALEXIN 500 MG PO CAPS
500.0000 mg | ORAL_CAPSULE | Freq: Once | ORAL | Status: AC
  Administered 2024-02-28: 500 mg via ORAL
  Filled 2024-02-27: qty 1

## 2024-02-27 MED ORDER — DIAZEPAM 5 MG/ML IJ SOLN
5.0000 mg | Freq: Once | INTRAMUSCULAR | Status: DC
  Filled 2024-02-27: qty 2

## 2024-02-27 MED ORDER — DIAZEPAM 5 MG/ML IJ SOLN
5.0000 mg | Freq: Once | INTRAMUSCULAR | Status: AC
  Administered 2024-02-27: 5 mg via INTRAMUSCULAR

## 2024-02-27 NOTE — ED Provider Notes (Incomplete)
 " [place] EMERGENCY DEPARTMENT AT [facility] Provider Note   CSN: [id number] Arrival date & time: 02/27/24  2033     Patient presents with: Hematuria   [name] is a 44 y.o. male.  {Add pertinent medical, surgical, social history, OB history to YEP:67052} The history is provided by the patient and medical records. The history is limited by the condition of the patient. No language interpreter was used.  Hematuria This is a new problem. The current episode started 12 to 24 hours ago. The problem has not changed since onset.Pertinent negatives include no chest pain, no abdominal pain, no headaches and no shortness of breath. Nothing aggravates the symptoms. Nothing relieves the symptoms. He has tried nothing for the symptoms. The treatment provided no relief.       Prior to Admission medications  Medication Sig Start Date End Date Taking? Authorizing Provider  acetaminophen  (TYLENOL ) 325 MG tablet Take 650 mg by mouth every 6 (six) hours as needed for mild pain or moderate pain. For pain    [provider]  baclofen  (LIORESAL ) 20 MG tablet Take 1 tablet (20 mg total) by mouth 4 (four) times daily. 05/27/19   Onita Duos, MD  calcium  carbonate (TUMS - DOSED IN MG ELEMENTAL CALCIUM ) 500 MG chewable tablet Chew 1 tablet by mouth 3 (three) times daily.    [provider]  cetirizine (ZYRTEC) 10 MG tablet Take 10 mg by mouth daily.    [provider]  cholecalciferol  (VITAMIN D ) 1000 UNITS tablet Take 1,000 Units by mouth 2 (two) times daily.    [provider]  dextromethorphan  (DELSYM ) 30 MG/5ML liquid Take 30 mg by mouth 2 (two) times daily as needed for cough.    [provider]  diazepam  (VALIUM ) 2 MG tablet Take 1 tablet (2 mg total) by mouth 2 (two) times daily. Must last 30 days. 05/27/19   Onita Duos, MD  diphenhydrAMINE  (BENADRYL ) 25 MG tablet Take 25 mg by mouth every 6 (six) hours as needed for allergies.    [provider]  furosemide  (LASIX ) 20 MG tablet Take 20 mg by mouth daily as needed for fluid (leg edema).    [provider]  glycerin  adult 2 g suppository Place 1 suppository rectally as needed for constipation.    [provider]  hydrochlorothiazide  (HYDRODIURIL ) 25 MG tablet Take 12.5 mg by mouth daily.     [provider]  metoprolol  succinate (TOPROL -XL) 50 MG 24 hr tablet Take 1 tablet (50 mg total) by mouth daily. 02/26/19   Tonnie George, PA-C  Multiple Vitamin (MULITIVITAMIN WITH MINERALS) TABS Take 1 tablet by mouth daily.    [provider]  nitrofurantoin  (MACRODANTIN ) 50 MG capsule Take 50 mg by mouth daily. continuous    [provider]  nystatin (MYCOSTATIN/NYSTOP) powder Apply 1 Application topically at bedtime as needed (irritation). 06/21/19   [provider]  ondansetron  (ZOFRAN  ODT) 4 MG disintegrating tablet Take 1 tablet (4 mg total) by mouth every 8 (eight) hours as needed for nausea or vomiting. 05/10/19   Petrucelli, Samantha R, PA-C  polyethylene glycol powder (GLYCOLAX /MIRALAX ) 17 GM/SCOOP powder Take 17 g by mouth daily. 01/02/23   Odell Celinda Balo, MD  psyllium (HYDROCIL/METAMUCIL) 95 % PACK Give him one dose daily as directed on the package.  This is to help with his constipation.  You can buy this over the counter at any drug store. Patient taking differently: Take 1 packet by mouth daily as  needed for mild constipation or moderate constipation. 02/25/19   Tonnie George, PA-C  senna-docusate (SENOKOT-S) 8.6-50 MG tablet Take 3 tablets by mouth at bedtime.    [provider]  Sodium Fluoride (SODIUM FLUORIDE 5000 PPM) 1.1 % PSTE Take 1 Application by mouth at bedtime. 10/29/19   [provider]  Stannous Fluoride (GEL-KAM) 0.4 % GEL Place 1 application onto teeth daily.    [provider]  tamsulosin  (FLOMAX ) 0.4 MG CAPS capsule Take 1 capsule (0.4 mg total) by mouth daily. 01/02/23    Odell Celinda Balo, MD  tiZANidine  (ZANAFLEX ) 2 MG tablet TAKE 1 TABLET BY MOUTH TWICE A DAY;TAKE 2 TABLETS (4MG ) BY MOUTH AT 12PM (DAY PROGRAM) Patient taking differently: Take 2-4 mg by mouth See admin instructions. TAKE 1 TABLET BY MOUTH TWICE A DAY;TAKE 2 TABLETS (4MG ) BY MOUTH AT 12PM (DAY PROGRAM) 05/03/20   Onita Duos, MD  traMADol  (ULTRAM ) 50 MG tablet Take 100 mg by mouth every 6 (six) hours as needed for moderate pain.    [provider]  warfarin (COUMADIN ) 3 MG tablet Take 3 mg by mouth daily at 6 PM.     [provider]    Allergies: Betadine [povidone iodine], Ciprofloxacin, Fentanyl , and Morphine    Review of Systems  Constitutional:  Negative for chills, fatigue, fever and unexpected weight change.  Respiratory:  Positive for cough. Negative for chest tightness, shortness of breath and wheezing.   Cardiovascular:  Negative for chest pain and palpitations.  Gastrointestinal:  Negative for abdominal pain, constipation, diarrhea, nausea and vomiting.  Genitourinary:  Positive for dysuria and hematuria. Negative for flank pain and frequency.  Musculoskeletal:  Negative for back pain, neck pain and neck stiffness.  Neurological:  Negative for seizures, light-headedness and headaches.  Psychiatric/Behavioral:  Negative for agitation.   All other systems reviewed and are negative.   Updated Vital Signs BP (!) 140/69   Pulse (!) 101   Temp (!) 97.5 F (36.4 C) (Axillary)   Resp 18   SpO2 96%   Physical Exam Vitals and nursing note reviewed.  Constitutional:      General: He is not in acute distress.    Appearance: He is well-developed. He is not ill-appearing, toxic-appearing or diaphoretic.  HENT:     Head: Normocephalic and atraumatic.     Nose: No congestion or rhinorrhea.     Mouth/Throat:     Pharynx: No oropharyngeal exudate or posterior oropharyngeal erythema.  Eyes:     Conjunctiva/sclera: Conjunctivae normal.  Cardiovascular:     Rate and  Rhythm: Normal rate and regular rhythm.     Heart sounds: No murmur heard. Pulmonary:     Effort: Pulmonary effort is normal. No respiratory distress.     Breath sounds: Normal breath sounds. No wheezing, rhonchi or rales.  Chest:     Chest wall: No tenderness.  Abdominal:     General: Abdomen is flat.     Palpations: Abdomen is soft.     Tenderness: There is no abdominal tenderness. There is no right CVA tenderness, left CVA tenderness, guarding or rebound.  Musculoskeletal:        General: No swelling or tenderness.     Cervical back: Neck supple.  Skin:    General: Skin is warm and dry.     Capillary Refill: Capillary refill takes less than 2 seconds.     Findings: No erythema or rash.  Neurological:     Mental Status: He is alert. Mental status  is at baseline.  Psychiatric:        Mood and Affect: Mood normal.     (all labs ordered are listed, but only abnormal results are displayed) Labs Reviewed  URINE CULTURE  CULTURE, BLOOD (ROUTINE X 2)  CULTURE, BLOOD (ROUTINE X 2)  URINALYSIS, ROUTINE W REFLEX MICROSCOPIC  CBC WITH DIFFERENTIAL/PLATELET  COMPREHENSIVE METABOLIC PANEL WITH GFR  PROTIME-INR  I-STAT CG4 LACTIC ACID, ED  I-STAT CG4 LACTIC ACID, ED    EKG: None  Radiology: No results found.  {Document cardiac monitor, telemetry assessment procedure when appropriate:32947} Procedures   Medications Ordered in the ED  diazepam  (VALIUM ) injection 5 mg (5 mg Intramuscular Given 02/27/24 2206)      {Click here for ABCD2, HEART and other calculators REFRESH Note before signing:1}                              Medical Decision Making Amount and/or Complexity of Data Reviewed Labs: ordered. Radiology: ordered.  Risk Prescription drug management.    Dannell Gortney is a 44 y.o. male with a past medical history significant for cerebral palsy, atrial fibrillation on Coumadin  therapy, previous DVT, hypertension, previous bowel obstruction, and previous urinary  tract infections who presents with hematuria and dysuria.  According to patient and family report to nursing, patient family noticed bloody and malodorous urine today and he has had some dysuria.  He says this is gone for the last few days.  He reports he has had a dry cough recently but denies any chest pain shortness of breath fevers or chills.  He reports no nausea, vomiting, constipation, or diarrhea.  He denies any injuries.  On exam, patient has contractures from cerebral palsy diffusely but he reports unchanged from baseline.  His lungs were clear and chest was nontender.  Abdomen nontender.  No rashes seen on initial exam.  Patient is resting comfortably.  He is slightly tachycardic at about 110 on my initial evaluation.    Given his report of urinary symptoms and history of UTIs, will get urinalysis and culture to look for UTI.  With his tachycardia we will get labs to look for evidence of sepsis or joint dysfunction or organ dysfunction.  Will get chest x-ray given his cough.  Patient reportedly asked nursing for some Valium  to help with his spasms and anxiousness for being here and we will get this for him.  Anticipate reassessment after workup to determine disposition but will likely be safe for discharge home if he has a UTI otherwise well-appearing for oral antibiotics and discharge.  Of note, he is denying any back pain or flank pain or abdominal pain so we will hold on imaging as a low suspicion for kidney stone at this time.  Will also check an INR as he is on Coumadin  with his hematuria reportedly.      {Document critical care time when appropriate  Document review of labs and clinical decision tools ie CHADS2VASC2, etc  Document your independent review of radiology images and any outside records  Document your discussion with family members, caretakers and with consultants  Document social determinants of health affecting pt's care  Document your decision making why or why not  admission, treatments were needed:32947:::1}   Final diagnoses:  None    ED Discharge Orders     None          "

## 2024-02-27 NOTE — ED Provider Notes (Signed)
 " [place] EMERGENCY DEPARTMENT AT [facility] Provider Note   CSN: [id number] Arrival date & time: 02/27/24  2033     Patient presents with: Hematuria   [name] is a 44 y.o. male.  {Add pertinent medical, surgical, social history, OB history to HPI:32947}  Hematuria       Prior to Admission medications  Medication Sig Start Date End Date Taking? Authorizing Provider  acetaminophen  (TYLENOL ) 325 MG tablet Take 650 mg by mouth every 6 (six) hours as needed for mild pain or moderate pain. For pain    [provider]  baclofen  (LIORESAL ) 20 MG tablet Take 1 tablet (20 mg total) by mouth 4 (four) times daily. 05/27/19   Onita Duos, MD  calcium  carbonate (TUMS - DOSED IN MG ELEMENTAL CALCIUM ) 500 MG chewable tablet Chew 1 tablet by mouth 3 (three) times daily.    [provider]  cetirizine (ZYRTEC) 10 MG tablet Take 10 mg by mouth daily.    [provider]  cholecalciferol  (VITAMIN D ) 1000 UNITS tablet Take 1,000 Units by mouth 2 (two) times daily.    [provider]  dextromethorphan  (DELSYM ) 30 MG/5ML liquid Take 30 mg by mouth 2 (two) times daily as needed for cough.    [provider]  diazepam  (VALIUM ) 2 MG tablet Take 1 tablet (2 mg total) by mouth 2 (two) times daily. Must last 30 days. 05/27/19   Onita Duos, MD  diphenhydrAMINE  (BENADRYL ) 25 MG tablet Take 25 mg by mouth every 6 (six) hours as needed for allergies.    [provider]  furosemide  (LASIX ) 20 MG tablet Take 20 mg by mouth daily as needed for fluid (leg edema).    [provider]  glycerin  adult 2 g suppository Place 1 suppository rectally as needed for constipation.    [provider]  hydrochlorothiazide  (HYDRODIURIL ) 25 MG tablet Take 12.5 mg by mouth daily.     [provider]  metoprolol  succinate (TOPROL -XL) 50 MG 24 hr tablet Take 1 tablet (50 mg total) by mouth daily. 02/26/19   Tonnie George, PA-C   Multiple Vitamin (MULITIVITAMIN WITH MINERALS) TABS Take 1 tablet by mouth daily.    [provider]  nitrofurantoin  (MACRODANTIN ) 50 MG capsule Take 50 mg by mouth daily. continuous    [provider]  nystatin (MYCOSTATIN/NYSTOP) powder Apply 1 Application topically at bedtime as needed (irritation). 06/21/19   [provider]  ondansetron  (ZOFRAN  ODT) 4 MG disintegrating tablet Take 1 tablet (4 mg total) by mouth every 8 (eight) hours as needed for nausea or vomiting. 05/10/19   Petrucelli, Samantha R, PA-C  polyethylene glycol powder (GLYCOLAX /MIRALAX ) 17 GM/SCOOP powder Take 17 g by mouth daily. 01/02/23   Odell Celinda Balo, MD  psyllium (HYDROCIL/METAMUCIL) 95 % PACK Give him one dose daily as directed on the package.  This is to help with his constipation.  You can buy this over the counter at any drug store. Patient taking differently: Take 1 packet by mouth daily as needed for mild constipation or moderate constipation. 02/25/19   Tonnie George, PA-C  senna-docusate (SENOKOT-S) 8.6-50 MG tablet Take 3 tablets by mouth at bedtime.    [provider]  Sodium Fluoride (SODIUM FLUORIDE 5000 PPM) 1.1 % PSTE Take 1 Application by mouth at bedtime. 10/29/19   [provider]  Stannous Fluoride (GEL-KAM) 0.4 % GEL Place 1 application onto teeth daily.    [provider]  tamsulosin  (FLOMAX ) 0.4 MG CAPS capsule  Take 1 capsule (0.4 mg total) by mouth daily. 01/02/23   Odell Celinda Balo, MD  tiZANidine  (ZANAFLEX ) 2 MG tablet TAKE 1 TABLET BY MOUTH TWICE A DAY;TAKE 2 TABLETS (4MG ) BY MOUTH AT 12PM (DAY PROGRAM) Patient taking differently: Take 2-4 mg by mouth See admin instructions. TAKE 1 TABLET BY MOUTH TWICE A DAY;TAKE 2 TABLETS (4MG ) BY MOUTH AT 12PM (DAY PROGRAM) 05/03/20   Onita Duos, MD  traMADol  (ULTRAM ) 50 MG tablet Take 100 mg by mouth every 6 (six) hours as needed for moderate pain.    [provider]  warfarin (COUMADIN ) 3 MG  tablet Take 3 mg by mouth daily at 6 PM.     [provider]    Allergies: Betadine [povidone iodine], Ciprofloxacin, Fentanyl , and Morphine    Review of Systems  Genitourinary:  Positive for hematuria.    Updated Vital Signs BP (!) 140/69   Pulse (!) 101   Temp (!) 97.5 F (36.4 C) (Axillary)   Resp 18   SpO2 96%   Physical Exam  (all labs ordered are listed, but only abnormal results are displayed) Labs Reviewed  URINALYSIS, ROUTINE W REFLEX MICROSCOPIC    EKG: None  Radiology: No results found.  {Document cardiac monitor, telemetry assessment procedure when appropriate:32947} Procedures   Medications Ordered in the ED - No data to display    {Click here for ABCD2, HEART and other calculators REFRESH Note before signing:1}                              Medical Decision Making Amount and/or Complexity of Data Reviewed Labs: ordered.    [name] is a 44 y.o. male with a past medical history significant for cerebral palsy, atrial fibrillation on Coumadin  therapy, previous DVT, hypertension, previous bowel obstruction, and previous urinary tract infections who presents with hematuria and dysuria.  According to patient and family report to nursing, patient family noticed bloody and malodorous urine today and he has had some dysuria.  He says this is gone for the last few days.  He reports he has had a dry cough recently but denies any chest pain shortness of breath fevers or chills.  He reports no nausea, vomiting, constipation, or diarrhea.  He denies any injuries.  On exam, patient has contractures from cerebral palsy diffusely but he reports unchanged from baseline.  His lungs were clear and chest was nontender.  Abdomen nontender.  No rashes seen on initial exam.  Patient is resting comfortably.  He is slightly tachycardic at about 110 on my initial evaluation.    Given his report of urinary symptoms and history of UTIs, will get urinalysis and  culture to look for UTI.  With his tachycardia we will get labs to look for evidence of sepsis or joint dysfunction or organ dysfunction.  Will get chest x-ray given his cough.  Patient reportedly asked nursing for some Valium  to help with his spasms and anxiousness for being here and we will get this for him.  Anticipate reassessment after workup to determine disposition but will likely be safe for discharge home if he has a UTI otherwise well-appearing for oral antibiotics and discharge.  Of note, he is denying any back pain or flank pain or abdominal pain so we will hold on imaging as a low suspicion for kidney stone at this time.  Will also check an INR as he is on Coumadin  with his hematuria reportedly.   {  Document critical care time when appropriate  Document review of labs and clinical decision tools ie CHADS2VASC2, etc  Document your independent review of radiology images and any outside records  Document your discussion with family members, caretakers and with consultants  Document social determinants of health affecting pt's care  Document your decision making why or why not admission, treatments were needed:32947:::1}   Final diagnoses:  None    ED Discharge Orders     None        "

## 2024-02-27 NOTE — ED Triage Notes (Signed)
 Patient arrived from home after his family noticed bloody and malodorous urine. Patient has complaints of pain with urination. Does take Warfarin. Hx of Cerebral palsy.

## 2024-02-27 NOTE — ED Notes (Signed)
 Attempted to obtain blood work once again with no success. MD notified.

## 2024-02-28 LAB — CBC WITH DIFFERENTIAL/PLATELET
Abs Immature Granulocytes: 0.02 10*3/uL (ref 0.00–0.07)
Basophils Absolute: 0 10*3/uL (ref 0.0–0.1)
Basophils Relative: 1 %
Eosinophils Absolute: 0.1 10*3/uL (ref 0.0–0.5)
Eosinophils Relative: 2 %
HCT: 37.3 % — ABNORMAL LOW (ref 39.0–52.0)
Hemoglobin: 12.4 g/dL — ABNORMAL LOW (ref 13.0–17.0)
Immature Granulocytes: 1 %
Lymphocytes Relative: 25 %
Lymphs Abs: 1 10*3/uL (ref 0.7–4.0)
MCH: 30.8 pg (ref 26.0–34.0)
MCHC: 33.2 g/dL (ref 30.0–36.0)
MCV: 92.8 fL (ref 80.0–100.0)
Monocytes Absolute: 0.6 10*3/uL (ref 0.1–1.0)
Monocytes Relative: 17 %
Neutro Abs: 2.1 10*3/uL (ref 1.7–7.7)
Neutrophils Relative %: 54 %
Platelets: 167 10*3/uL (ref 150–400)
RBC: 4.02 MIL/uL — ABNORMAL LOW (ref 4.22–5.81)
RDW: 13.5 % (ref 11.5–15.5)
WBC: 3.9 10*3/uL — ABNORMAL LOW (ref 4.0–10.5)
nRBC: 0 % (ref 0.0–0.2)

## 2024-02-28 LAB — COMPREHENSIVE METABOLIC PANEL WITH GFR
ALT: 11 U/L (ref 0–44)
AST: 20 U/L (ref 15–41)
Albumin: 3.5 g/dL (ref 3.5–5.0)
Alkaline Phosphatase: 62 U/L (ref 38–126)
Anion gap: 7 (ref 5–15)
BUN: 11 mg/dL (ref 6–20)
CO2: 29 mmol/L (ref 22–32)
Calcium: 8.7 mg/dL — ABNORMAL LOW (ref 8.9–10.3)
Chloride: 108 mmol/L (ref 98–111)
Creatinine, Ser: 0.72 mg/dL (ref 0.61–1.24)
GFR, Estimated: >60 mL/min
Glucose, Bld: 110 mg/dL — ABNORMAL HIGH (ref 70–99)
Potassium: 4 mmol/L (ref 3.5–5.1)
Sodium: 144 mmol/L (ref 135–145)
Total Bilirubin: 0.7 mg/dL (ref 0.0–1.2)
Total Protein: 6.4 g/dL — ABNORMAL LOW (ref 6.5–8.1)

## 2024-02-28 LAB — PROTIME-INR
INR: 2.8 — ABNORMAL HIGH (ref 0.8–1.2)
Prothrombin Time: 30.8 s — ABNORMAL HIGH (ref 11.4–15.2)

## 2024-02-28 LAB — I-STAT CG4 LACTIC ACID, ED: Lactic Acid, Venous: 1.7 mmol/L (ref 0.5–1.9)

## 2024-02-28 MED ORDER — CEFUROXIME AXETIL 500 MG PO TABS: 500.0000 mg | ORAL_TABLET | Freq: Two times a day (BID) | ORAL | 0 refills | Status: DC

## 2024-02-28 MED ORDER — CEFUROXIME AXETIL 500 MG PO TABS: 500.0000 mg | ORAL_TABLET | Freq: Two times a day (BID) | ORAL | 0 refills | Status: AC

## 2024-02-28 MED ORDER — AZITHROMYCIN 250 MG PO TABS: ORAL_TABLET | ORAL | 0 refills | Status: AC

## 2024-02-28 MED ORDER — AZITHROMYCIN 250 MG PO TABS: ORAL_TABLET | ORAL | 0 refills | Status: DC

## 2024-02-28 MED ORDER — CEFUROXIME AXETIL 500 MG PO TABS
500.0000 mg | ORAL_TABLET | Freq: Once | ORAL | Status: AC
  Administered 2024-02-28: 500 mg via ORAL
  Filled 2024-02-28: qty 1

## 2024-02-28 NOTE — ED Notes (Signed)
 Verbal report given to [facility] staff.

## 2024-02-28 NOTE — Discharge Instructions (Addendum)
 Your history, exam, workup today did reveal urinary tract infection and pneumonia as you were concerned about.  Please take the antibiotics with the Ceftin  and the azithromycin .  We spoke to pharmacy who recommended this combination in context of your previous cultures, intolerances, allergies, and other medication use.  Please follow-up with your primary doctor and rest and stay hydrated.  If any symptoms change or worsen acutely, please return to the nearest emergency department.

## 2024-02-29 LAB — URINE CULTURE

## 2024-03-04 LAB — CULTURE, BLOOD (ROUTINE X 2)
Culture: NO GROWTH
Special Requests: ADEQUATE
